# Patient Record
Sex: Female | Born: 1955 | Race: White | Hispanic: No | Marital: Married | Smoking: Never smoker
Health system: Southern US, Community
[De-identification: ages and names within clinical notes are randomized; demographics above are authoritative.]

## PROBLEM LIST (undated history)

## (undated) ENCOUNTER — Ambulatory Visit: Admission: EM | Payer: Medicare Other | Source: Home / Self Care

## (undated) DIAGNOSIS — Z9889 Other specified postprocedural states: Secondary | ICD-10-CM

## (undated) DIAGNOSIS — N189 Chronic kidney disease, unspecified: Secondary | ICD-10-CM

## (undated) DIAGNOSIS — K219 Gastro-esophageal reflux disease without esophagitis: Secondary | ICD-10-CM

## (undated) DIAGNOSIS — I2510 Atherosclerotic heart disease of native coronary artery without angina pectoris: Secondary | ICD-10-CM

## (undated) DIAGNOSIS — M1990 Unspecified osteoarthritis, unspecified site: Secondary | ICD-10-CM

## (undated) DIAGNOSIS — E7800 Pure hypercholesterolemia, unspecified: Secondary | ICD-10-CM

## (undated) DIAGNOSIS — R112 Nausea with vomiting, unspecified: Secondary | ICD-10-CM

## (undated) DIAGNOSIS — I639 Cerebral infarction, unspecified: Secondary | ICD-10-CM

## (undated) DIAGNOSIS — I472 Ventricular tachycardia: Secondary | ICD-10-CM

## (undated) DIAGNOSIS — I7300 Raynaud's syndrome without gangrene: Secondary | ICD-10-CM

## (undated) DIAGNOSIS — J42 Unspecified chronic bronchitis: Secondary | ICD-10-CM

## (undated) DIAGNOSIS — Z87442 Personal history of urinary calculi: Secondary | ICD-10-CM

## (undated) DIAGNOSIS — Z8489 Family history of other specified conditions: Secondary | ICD-10-CM

## (undated) DIAGNOSIS — I209 Angina pectoris, unspecified: Secondary | ICD-10-CM

## (undated) DIAGNOSIS — G43909 Migraine, unspecified, not intractable, without status migrainosus: Secondary | ICD-10-CM

## (undated) DIAGNOSIS — N3010 Interstitial cystitis (chronic) without hematuria: Secondary | ICD-10-CM

## (undated) DIAGNOSIS — F419 Anxiety disorder, unspecified: Secondary | ICD-10-CM

## (undated) DIAGNOSIS — J45909 Unspecified asthma, uncomplicated: Secondary | ICD-10-CM

## (undated) DIAGNOSIS — Z7902 Long term (current) use of antithrombotics/antiplatelets: Secondary | ICD-10-CM

## (undated) DIAGNOSIS — T8859XA Other complications of anesthesia, initial encounter: Secondary | ICD-10-CM

## (undated) DIAGNOSIS — I10 Essential (primary) hypertension: Secondary | ICD-10-CM

## (undated) DIAGNOSIS — Z8719 Personal history of other diseases of the digestive system: Secondary | ICD-10-CM

## (undated) DIAGNOSIS — J449 Chronic obstructive pulmonary disease, unspecified: Secondary | ICD-10-CM

## (undated) DIAGNOSIS — F32A Depression, unspecified: Secondary | ICD-10-CM

## (undated) DIAGNOSIS — Z8739 Personal history of other diseases of the musculoskeletal system and connective tissue: Secondary | ICD-10-CM

## (undated) HISTORY — PX: LAPAROSCOPIC CHOLECYSTECTOMY: SUR755

## (undated) HISTORY — PX: SHOULDER SURGERY: SHX246

## (undated) HISTORY — DX: Chronic kidney disease, unspecified: N18.9

## (undated) HISTORY — PX: REPAIR ANKLE LIGAMENT: SUR1187

## (undated) HISTORY — PX: BACK SURGERY: SHX140

## (undated) HISTORY — PX: CYSTOSCOPY W/ STONE MANIPULATION: SHX1427

## (undated) HISTORY — DX: Raynaud's syndrome without gangrene: I73.00

## (undated) HISTORY — DX: Atherosclerotic heart disease of native coronary artery without angina pectoris: I25.10

## (undated) HISTORY — PX: ABDOMINAL HYSTERECTOMY: SHX81

## (undated) HISTORY — PX: CATARACT EXTRACTION, BILATERAL: SHX1313

## (undated) HISTORY — PX: APPENDECTOMY: SHX54

## (undated) HISTORY — PX: ANTERIOR CERVICAL DECOMP/DISCECTOMY FUSION: SHX1161

## (undated) HISTORY — PX: TUBAL LIGATION: SHX77

## (undated) HISTORY — PX: AUGMENTATION MAMMAPLASTY: SUR837

## (undated) HISTORY — PX: REPLACEMENT TOTAL KNEE: SUR1224

## (undated) HISTORY — PX: TONSILLECTOMY: SUR1361

## (undated) HISTORY — DX: Angina pectoris, unspecified: I20.9

## (undated) HISTORY — PX: KNEE ARTHROSCOPY: SHX127

---

## 1997-12-06 ENCOUNTER — Ambulatory Visit (HOSPITAL_BASED_OUTPATIENT_CLINIC_OR_DEPARTMENT_OTHER): Admission: RE | Admit: 1997-12-06 | Discharge: 1997-12-06 | Payer: Self-pay | Admitting: Orthopedic Surgery

## 1998-11-25 ENCOUNTER — Ambulatory Visit (HOSPITAL_COMMUNITY): Admission: RE | Admit: 1998-11-25 | Discharge: 1998-11-25 | Payer: Self-pay | Admitting: Neurosurgery

## 1998-11-25 ENCOUNTER — Encounter: Payer: Self-pay | Admitting: Neurosurgery

## 1999-03-17 ENCOUNTER — Encounter: Payer: Self-pay | Admitting: Emergency Medicine

## 1999-03-17 ENCOUNTER — Emergency Department (HOSPITAL_COMMUNITY): Admission: EM | Admit: 1999-03-17 | Discharge: 1999-03-17 | Payer: Self-pay | Admitting: Emergency Medicine

## 1999-05-01 ENCOUNTER — Ambulatory Visit (HOSPITAL_COMMUNITY): Admission: RE | Admit: 1999-05-01 | Discharge: 1999-05-01 | Payer: Self-pay | Admitting: *Deleted

## 1999-05-01 ENCOUNTER — Encounter: Payer: Self-pay | Admitting: *Deleted

## 2000-03-17 ENCOUNTER — Encounter: Payer: Self-pay | Admitting: Family Medicine

## 2000-03-17 ENCOUNTER — Encounter: Admission: RE | Admit: 2000-03-17 | Discharge: 2000-03-17 | Payer: Self-pay | Admitting: Family Medicine

## 2000-10-15 ENCOUNTER — Encounter: Admission: RE | Admit: 2000-10-15 | Discharge: 2000-10-15 | Payer: Self-pay | Admitting: Family Medicine

## 2000-10-15 ENCOUNTER — Encounter: Payer: Self-pay | Admitting: Family Medicine

## 2000-11-02 ENCOUNTER — Encounter: Admission: RE | Admit: 2000-11-02 | Discharge: 2000-11-02 | Payer: Self-pay | Admitting: Family Medicine

## 2000-11-02 ENCOUNTER — Encounter: Payer: Self-pay | Admitting: Family Medicine

## 2001-03-21 ENCOUNTER — Ambulatory Visit (HOSPITAL_COMMUNITY): Admission: RE | Admit: 2001-03-21 | Discharge: 2001-03-21 | Payer: Self-pay | Admitting: Family Medicine

## 2001-03-21 ENCOUNTER — Encounter: Payer: Self-pay | Admitting: Family Medicine

## 2001-09-26 ENCOUNTER — Encounter: Admission: RE | Admit: 2001-09-26 | Discharge: 2001-09-26 | Payer: Self-pay | Admitting: Family Medicine

## 2001-09-26 ENCOUNTER — Encounter: Payer: Self-pay | Admitting: Family Medicine

## 2002-08-22 ENCOUNTER — Ambulatory Visit (HOSPITAL_COMMUNITY): Admission: RE | Admit: 2002-08-22 | Discharge: 2002-08-22 | Payer: Self-pay | Admitting: Family Medicine

## 2002-08-22 ENCOUNTER — Encounter: Payer: Self-pay | Admitting: Family Medicine

## 2003-04-11 ENCOUNTER — Encounter: Payer: Self-pay | Admitting: Internal Medicine

## 2003-04-11 ENCOUNTER — Encounter: Admission: RE | Admit: 2003-04-11 | Discharge: 2003-04-11 | Payer: Self-pay | Admitting: Internal Medicine

## 2003-04-30 ENCOUNTER — Encounter (INDEPENDENT_AMBULATORY_CARE_PROVIDER_SITE_OTHER): Payer: Self-pay

## 2003-04-30 ENCOUNTER — Encounter (INDEPENDENT_AMBULATORY_CARE_PROVIDER_SITE_OTHER): Payer: Self-pay | Admitting: Specialist

## 2003-04-30 ENCOUNTER — Observation Stay (HOSPITAL_COMMUNITY): Admission: RE | Admit: 2003-04-30 | Discharge: 2003-04-30 | Payer: Self-pay | Admitting: *Deleted

## 2004-01-15 ENCOUNTER — Ambulatory Visit (HOSPITAL_COMMUNITY): Admission: RE | Admit: 2004-01-15 | Discharge: 2004-01-15 | Payer: Self-pay | Admitting: Family Medicine

## 2004-06-11 ENCOUNTER — Encounter (INDEPENDENT_AMBULATORY_CARE_PROVIDER_SITE_OTHER): Payer: Self-pay | Admitting: Specialist

## 2004-06-11 ENCOUNTER — Ambulatory Visit (HOSPITAL_COMMUNITY): Admission: RE | Admit: 2004-06-11 | Discharge: 2004-06-11 | Payer: Self-pay | Admitting: *Deleted

## 2005-01-15 ENCOUNTER — Ambulatory Visit (HOSPITAL_COMMUNITY): Admission: RE | Admit: 2005-01-15 | Discharge: 2005-01-15 | Payer: Self-pay | Admitting: Internal Medicine

## 2006-03-11 ENCOUNTER — Ambulatory Visit (HOSPITAL_COMMUNITY): Admission: RE | Admit: 2006-03-11 | Discharge: 2006-03-11 | Payer: Self-pay | Admitting: Internal Medicine

## 2006-12-07 ENCOUNTER — Ambulatory Visit (HOSPITAL_COMMUNITY): Admission: RE | Admit: 2006-12-07 | Discharge: 2006-12-07 | Payer: Self-pay | Admitting: Internal Medicine

## 2007-04-06 ENCOUNTER — Ambulatory Visit (HOSPITAL_COMMUNITY): Admission: RE | Admit: 2007-04-06 | Discharge: 2007-04-06 | Payer: Self-pay | Admitting: Internal Medicine

## 2007-07-12 ENCOUNTER — Emergency Department (HOSPITAL_COMMUNITY): Admission: EM | Admit: 2007-07-12 | Discharge: 2007-07-12 | Payer: Self-pay | Admitting: Emergency Medicine

## 2007-08-01 ENCOUNTER — Encounter: Admission: RE | Admit: 2007-08-01 | Discharge: 2007-08-01 | Payer: Self-pay | Admitting: Orthopedic Surgery

## 2007-10-03 ENCOUNTER — Ambulatory Visit (HOSPITAL_COMMUNITY): Admission: RE | Admit: 2007-10-03 | Discharge: 2007-10-04 | Payer: Self-pay | Admitting: Neurosurgery

## 2007-10-04 ENCOUNTER — Emergency Department (HOSPITAL_COMMUNITY): Admission: EM | Admit: 2007-10-04 | Discharge: 2007-10-04 | Payer: Self-pay | Admitting: Emergency Medicine

## 2008-05-09 ENCOUNTER — Encounter: Admission: RE | Admit: 2008-05-09 | Discharge: 2008-05-09 | Payer: Self-pay | Admitting: Internal Medicine

## 2008-05-16 ENCOUNTER — Encounter: Admission: RE | Admit: 2008-05-16 | Discharge: 2008-05-16 | Payer: Self-pay | Admitting: Internal Medicine

## 2008-05-30 ENCOUNTER — Encounter: Admission: RE | Admit: 2008-05-30 | Discharge: 2008-05-30 | Payer: Self-pay | Admitting: Internal Medicine

## 2008-06-13 ENCOUNTER — Encounter: Admission: RE | Admit: 2008-06-13 | Discharge: 2008-06-13 | Payer: Self-pay | Admitting: Internal Medicine

## 2008-11-08 ENCOUNTER — Ambulatory Visit (HOSPITAL_COMMUNITY): Admission: RE | Admit: 2008-11-08 | Discharge: 2008-11-08 | Payer: Self-pay | Admitting: Endocrinology

## 2008-12-07 ENCOUNTER — Encounter: Admission: RE | Admit: 2008-12-07 | Discharge: 2008-12-07 | Payer: Self-pay | Admitting: Internal Medicine

## 2008-12-21 ENCOUNTER — Encounter: Admission: RE | Admit: 2008-12-21 | Discharge: 2008-12-21 | Payer: Self-pay

## 2009-01-04 ENCOUNTER — Encounter: Admission: RE | Admit: 2009-01-04 | Discharge: 2009-01-04 | Payer: Self-pay | Admitting: Internal Medicine

## 2009-08-12 ENCOUNTER — Ambulatory Visit (HOSPITAL_COMMUNITY): Admission: RE | Admit: 2009-08-12 | Discharge: 2009-08-12 | Payer: Self-pay | Admitting: Internal Medicine

## 2009-10-04 ENCOUNTER — Ambulatory Visit (HOSPITAL_BASED_OUTPATIENT_CLINIC_OR_DEPARTMENT_OTHER): Admission: RE | Admit: 2009-10-04 | Discharge: 2009-10-04 | Payer: Self-pay | Admitting: Orthopedic Surgery

## 2010-07-10 ENCOUNTER — Ambulatory Visit (HOSPITAL_COMMUNITY): Admission: RE | Admit: 2010-07-10 | Discharge: 2010-07-10 | Payer: Self-pay | Admitting: Internal Medicine

## 2010-09-27 ENCOUNTER — Encounter: Payer: Self-pay | Admitting: Family Medicine

## 2010-09-28 ENCOUNTER — Encounter: Payer: Self-pay | Admitting: Internal Medicine

## 2010-09-28 ENCOUNTER — Encounter: Payer: Self-pay | Admitting: Endocrinology

## 2010-11-23 LAB — POCT HEMOGLOBIN-HEMACUE: Hemoglobin: 15.3 g/dL — ABNORMAL HIGH (ref 12.0–15.0)

## 2011-01-20 NOTE — Op Note (Signed)
NAME:  Lippert, Thresia              ACCOUNT NO.:  [id number]   MEDICAL RECORD NO.:  [id number]          PATIENT TYPE:  OIB   LOCATION:  3534                         FACILITY:  [facility]   PHYSICIAN:  Hewitt Shorts, M.D.DATE OF BIRTH:  [birth year]   DATE OF PROCEDURE:  DATE OF DISCHARGE:  10/04/2007                               OPERATIVE REPORT   PREOPERATIVE DIAGNOSES:  1. Cervical disk herniation.  2. Cervical spondylosis.  3. Cervical degenerative disk disease.  4. Cervical radiculopathy.   POSTOPERATIVE DIAGNOSES:  1. Cervical disk herniation.  2. Cervical spondylosis.  3. Cervical degenerative disk disease.  4. Cervical radiculopathy.   PROCEDURE:  C5-C6 and C6-C7 anterior cervical decompression and  arthrodesis with structural allograft and tether cervical plating.   SURGEON:  Hewitt Shorts, M.D.   ASSISTANT:  Danae Orleans. Venetia Maxon, M.D.   ANESTHESIA:  General endotracheal.   INDICATIONS:  The patient is a 55 year old woman who presented with neck  and left cervical radicular pain.  She was found to have spondylosis and  degenerative disc disease C6-C7 worse than C5-C6 with a left C6-C7  cervical disk herniation.  A decision was made to proceed with a 2 level  anterior cervical decompression and arthrodesis.   DESCRIPTION OF PROCEDURE:  The patient was brought to the operating room  and placed under general endotracheal anesthesia.  The patient was  placed in 10 pounds of halter traction.  The neck was prepped with  Betadine soap and solution and draped in a sterile fashion.  A  horizontal incision was made on the left side of the neck.  The line of  the incision was infiltrated with local anesthetic with epinephrine.  Dissection was made and carried down to the subcutaneous tissue and  platysma and bipolar cautery was used to maintain hemostasis.  Dissection was then carried out through an avascular plane leaving the  sternocleidomastoid muscle and carotid artery  and jugular vein laterally  and trachea and esophagus medially.  The ventral aspect of the vetebral  column was identified and localizing x-ray taken and the C5-C6 and C6-C7  intravertebral disk space was identified.  Diskectomy was begun with  incision of the annulus, continued with micro curettes and pituitary  rongeurs.  Anterior osteophytic overgrowth were removed using a lexel  rongeur and osteophyte removal tool as well as Kerrison punches.  We  entered into each of the disk spaces.  The C6-C7 level was very  spondylitic and narrowed.  The disk material was removed using pituitary  rongeurs and micro curettes and then the cartilaginous endplates were  removed using micro curettes along with the XMax drill.  The microscope  was draped and brought into the field to provide visualization,  magnification and illumination and the remainder of decompression was  performed using microdissection and microsurgical technique.  Posterior  osteophytic overgrowth was removed using the XMax drill with a 2 mm  Kerrison punch with thin foot plates.  The posterior longitudinal  ligament was carefully removed and then we decompressed the thecal sac  and spinal canal.  We then turned our attention to the  neural foramina  and similarly we removed spondylitic overgrowth and disk herniation and  decompressed the axillary nerve roots bilaterally at each level.  Once  decompression was completed hemostasis was established with the use of  Gelfoam soaked in thrombin, and then we measured the height of the  intravertebral disk space and selected two 7-mm implants, each of these  pieces of structural allograft were hydrated with saline solution and  then positioned in the intravertebral disk space and counter sunk.  We  then selected a 32-mm tether cervical plate was positioned over the  fusion contour.  It was secured to the vertebra with 4 x 13-mm variable  angle screws, each screw hole was drilled in the  tap and the screws were  placed in the alternating fashion.  Once all 5 screws were placed, final  plating was performed.  The cervical traction was discontinued and then  was irrigated with Bacitracin solution and was checked for hemostasis  which was confirmed, and then x-rays taken showed the graft, plate, and  screws in good position.  The alignment was good, and then we proceeded  with closure.  The platysma was closed with interrupted inverted 2-0  Vicryl sutures, the subcutaneous and subcuticular were closed with  interrupted inverted 3-0 Vicryl sutures, and the skin was reapproximated  with Dermabond.  The procedure was tolerated well.  The estimated blood  loss was 25 cc.  Sponge and needle count were correct following surgery.  The patient was placed in a soft cervical collar, to be reversed from  the anesthetic, extubated, and transferred to the recovery room for  further care.      Hewitt Shorts, M.D.  Electronically Signed     RWN/MEDQ  D:  10/03/2007  T:  10/04/2007  Job:  [id number]

## 2011-01-20 NOTE — Consult Note (Signed)
NAME:  Tonya Boyd, Tonya Boyd              ACCOUNT NO.:  [id number]   MEDICAL RECORD NO.:  [id number]          PATIENT TYPE:  EMS   LOCATION:  MAJO                         FACILITY:  [facility]   PHYSICIAN:  Hewitt Shorts, M.D.DATE OF BIRTH:  11/06/1955   DATE OF CONSULTATION:  10/04/2007  DATE OF DISCHARGE:  10/04/2007                                 CONSULTATION   HISTORY OF PRESENT ILLNESS:  The patient is a 55 year old white female  who is one day status post 2-level C5-C6 and C6-C7 anterior cervical  decompression and arthrodesis.  She was discharged this morning to home  with a prescription for Darvocet.  She did use some Aleve as well as  Darvocet at home.  She contacted our office this afternoon saying that  she was having itching, welts, and difficulty breathing and we  recommended that she come into the emergency room for evaluation.   PHYSICAL EXAMINATION:  Vital signs at the time of her arrival included  temperature 97.3, pulse 112, blood pressure 157/91, and oxygen  saturation of 100% on room air.   On examination, the patient was found to be resting comfortably,  scratching her skin with a mild erythema in the scratched areas, but no  hives were seen on her face or torso.  Her lungs were clear to  auscultation.  She had symmetrical respiratory excursions.  Her wound  had healed well.  There was no erythema or swelling.   The patient was given Benadryl 50 mg by mouth and after about 1 hour was  feeling much better with the itching resolved and the erythema cleared.   IMPRESSION:  Pruritus, resolved.   RECOMMENDATIONS:  Benadryl 25 to 50 mg by mouth after every 4 hours  p.r.n. for itching and Tylenol p.r.n. for pain.  She is scheduled for  followup in 3 weeks in the office, but she is to return sooner if she  has further difficulties.      Hewitt Shorts, M.D.  Electronically Signed     RWN/MEDQ  D:  10/04/2007  T:  10/05/2007  Job:  [id number]

## 2011-01-23 NOTE — Op Note (Signed)
NAME:  Tonya Boyd, Tonya Boyd                        ACCOUNT NO.:  [id number]   MEDICAL RECORD NO.:  [id number]                   PATIENT TYPE:  OBV   LOCATION:  9198                                 FACILITY:  [facility]   PHYSICIAN:  Pershing Cox, M.D.            DATE OF BIRTH:  [birth year]   DATE OF PROCEDURE:  04/30/2003  DATE OF DISCHARGE:                                 OPERATIVE REPORT   PREOPERATIVE DIAGNOSIS:  Complex left ovarian cyst.   POSTOPERATIVE DIAGNOSIS:  Complex left ovarian cyst.   PROCEDURE:  Diagnostic laparoscopic with left salpingo-oophorectomy.   INDICATIONS FOR PROCEDURE:  The patient is 55 years old.  She is having back  pain and underwent an MRI.  An abnormal ovary was noted and this was  followed up by sonogram which showed Boyd cystic lesion with Boyd mural wall  nodule.  The patient is status post vaginal hysterectomy.  Both of her  ovaries were left in place at that time.  Despite the possibility of  spontaneous resolution, the patient insisted on moving ahead with an  operative procedure because of her fear of malignancy.   DESCRIPTION OF PROCEDURE:  The patient was taken to the operating room on  the day of April 30, 2003, after receiving Boyd mini bowel prep.  Diagnostic  laparoscopy was adequate.  We could see the ovarian cyst.  It was densely  adherent to the left wall of the rectosigmoid and to the round ligament.  We  were able to remove it from both of these attachment sites and the vascular  pedicle or IP ligament was easily accessible.  The ureter could be seen and  therefore the ovary could be adequately excised. The right fallopian tube  and ovary were visualized and were normal.  Boyd scar from the patient's  previous appendectomy was noted.  There were no intra-abdominal adhesions  noted.   DESCRIPTION OF PROCEDURE:  Tonya Boyd was brought to the operating room  with an IV in place.  It was very difficult to place this IV and this  delayed the  start of the case.  In the operating room, she was placed supine  on the OR table and IV sedation was administered.  Endotracheal anesthesia  was established easily.  The patient was placed in [place] stirrups with PAS  stockings placed to the top of her thighs.  The anterior abdominal wall was  prepped with Boyd solution of Hibiclens, paying attention to the umbilicus.  Boyd  Foley catheter was sterilely inserted into the bladder and she was draped  for Boyd laparoscopic procedure.   Marcaine was inserted into the subumbilical scar where the patient had had Boyd  previous laparoscopy.  The scar was incised in Boyd curvilinear fashion, taking  it down into the dermis.  The subcutaneous tissues were spread and the  fascia was noted.  It was lifted with Kocher clamps and incised. The  incision was extended so that my finger would fit and in palpating, we had  entered the peritoneal cavity with this first incision.  There was no  evidence of injury to underlying structures or to bowel.  Boyd pursestring  suture was placed around the fascia and the Hasson cannula was inserted and  secured to the pursestring suture.  The diagnostic scope was introduced and  we were able to manipulate in such Boyd way that we could see that the  rectosigmoid was adherent to our left adnexal mass.  There was no other  evidence of significant scarring, so Boyd decision was made to try the  laparoscopic procedure.  The 5 mm ports were placed in both the right and  left lower abdomen, transilluminating and taking every effort to avoid  causing Boyd vascular injury of the epigastrics.  First using grasping forceps  and Boyd probe, the ovary was explored and we were able to see the ureter  beneath the vascular pedicle.  The IP ligament was easily accessible.  There  was scarring to the side wall of the rectosigmoid, but we decided to begin  the operation and see if we would be able to take this off without  difficulty.  There was Boyd small  hydrosalpinx on the left fallopian tube.  There was Boyd small hydrosalpinx on the left fallopian tube.  We were able to  remove the ovary without injuring this.  Using the tripolar cautery, the IP  ligament was lifted and cauterized without difficulty.  This was done in  several steps to avoid injuring the blood supply to this ovary.  We were  then able to lift the ovary and seek detachment to the rectosigmoid.  The  rectosigmoid was reflected medially and using sharp scissors we were able to  separate the rectosigmoid from the side wall of the ovary and fallopian  tube.  Then, by lifting it, we were able to use the tripolar cautery to  separate it from the attached round ligament.  The ovary was then lifted  freely from its attachment to these structures and the structures were  carefully visualized and were appropriate.  Small bleeders were cauterized  with the tripolar.  There was no evidence of any injury to the rectosigmoid  and all of our dissection was far away from the lumen.   Photographs were taken of the remainder of the peritoneal cavity.  The right  fallopian tube and ovary were visualized.  There were no photographs taken  of the upper abdomen and the patient's previous appendectomy scar.   Boyd 5 mm camera was then placed through the right lower quadrant trocar and Boyd  catchment bag was placed through the umbilicus. The ovary and fallopian tube  were easily dropped into this bag and retrieved through the umbilical port.  Hasson cannula and scope were then reintroduced through the umbilical port  and photographs were taken.  Washings were taken at this time and collected  to send for peritoneal washings.  We carefully inspected the operative site  and there was no evidence of bleeding. Gas was removed from the peritoneal  cavity and still no evidence of back bleeding.  Trocars were removed under  direct visualization.  The Hasson cannula was removed and Boyd finger was placed into  the peritoneal cavity while tying down the fascial incision  around the umbilicus.  The skin edges were closed with Boyd running 3-0 Vicryl  suture.  Dermabond was used to close the  lower 5 mm ports.   The patient was taken to the recovery room where she will be observed.  The  decision will be made as to whether she needs to be admitted for  observation.                                               Pershing Cox, M.D.    MAJ/MEDQ  D:  04/30/2003  T:  04/30/2003  Job:  [id number]   cc:   Filomena Jungling, N.P.

## 2011-01-23 NOTE — Op Note (Signed)
NAME:  Tonya Boyd, Tonya Boyd              ACCOUNT NO.:  [id number]   MEDICAL RECORD NO.:  [id number]          PATIENT TYPE:  AMB   LOCATION:  ENDO                         FACILITY:  [facility]   PHYSICIAN:  Georgiana Spinner, M.D.    DATE OF BIRTH:  March 29, 1956   DATE OF PROCEDURE:  06/11/2004  DATE OF DISCHARGE:                                 OPERATIVE REPORT   PROCEDURE:  Upper endoscopy.   INDICATIONS:  GERD.   ANESTHESIA:  Demerol 60 mg, Versed 6 mg.   PROCEDURE:  With the patient mildly sedated in the left lateral decubitus  position, the Olympus videoscopic endoscope was inserted in the mouth,  passed under direct vision through the esophagus, which appeared normal.  There was no evidence of Barrett's.  We entered into the stomach through a  small hiatal hernia.  Fundus, body, antrum, duodenal bulb, second portion of  duodenum all appeared normal.  From this point the endoscope was slowly  withdrawn taking circumferential views of the duodenal mucosa until the  endoscope had been pulled back into the stomach, placed in retroflexion to  view the stomach from below.  The endoscope was straightened and withdrawn,  taking circumferential views of the remaining gastric and esophageal mucosa.  The patient's vital signs and pulse oximetry remained stable.  The patient  tolerated the procedure well without apparent complication.   FINDINGS:  Unremarkable examination.  Small hiatal hernia.   PLAN:  Proceed to colonoscopy as planned.      GMO/MEDQ  D:  06/11/2004  T:  06/11/2004  Job:  [id number]

## 2011-01-23 NOTE — Op Note (Signed)
NAME:  Tonya Boyd, Tonya Boyd              ACCOUNT NO.:  [id number]   MEDICAL RECORD NO.:  [id number]          PATIENT TYPE:  AMB   LOCATION:  ENDO                         FACILITY:  [facility]   PHYSICIAN:  Georgiana Spinner, M.D.    DATE OF BIRTH:  [birth year]   DATE OF PROCEDURE:  06/11/2004  DATE OF DISCHARGE:                                 OPERATIVE REPORT   PROCEDURE:  Colonoscopy.   ENDOSCOPIST:  Georgiana Spinner, M.D.   INDICATIONS:  Colon polyp.   ANESTHESIA:  Demerol 10 mg, Versed 2 mg, Benadryl 25 mg.   Of note, for patient's previous endoscopy, add Phenergan 12.5 mg.   PROCEDURE:  With the patient mildly sedated in the left lateral decubitus  position, the Olympus videoscopic colonoscope was inserted in the rectum and  passed under direct vision to the cecum, identified by ileocecal valve and  base of cecum, both of which were photographed.  From this point, the  colonoscope was slowly withdrawn, taking circumferential views of the  colonic mucosa, stopping in the hepatic flexure area, where a polyp was  seen, photographed and removed using hot-biopsy-forceps technique, setting  of 20/150 blended current with the Erbe pulse generator.  The endoscope was  then withdrawn all the way to the rectum, which appeared normal on direct  and showed hemorrhoids on retroflexed view.  The endoscope was straightened  and withdrawn.  The patient's vital signs and pulse oximetry remained  stable.  The patient tolerated the procedure well without apparent  complication.   FINDINGS:  1.  Internal hemorrhoids.  2.  Polyp of hepatic flexure.   PLAN:  Await biopsy report.  The patient will call me for results and follow  up with me as an outpatient.      GMO/MEDQ  D:  06/11/2004  T:  06/11/2004  Job:  [id number]

## 2011-01-23 NOTE — Consult Note (Signed)
NAME:  Tonya Boyd, Tonya Boyd                        ACCOUNT NO.:  [id number]   MEDICAL RECORD NO.:  [id number]                   PATIENT TYPE:  OUT   LOCATION:  MAMO                                 FACILITY:  [facility]   PHYSICIAN:  Aundra Dubin, M.D.            DATE OF BIRTH:  03/08/1956   DATE OF CONSULTATION:  09/04/2002  DATE OF DISCHARGE:  08/22/2002                                   CONSULTATION   CHIEF COMPLAINT:  Shoulders, back, hips, knees, and legs.   HISTORY:  The patient returns with her painful syndrome that was suggestive  of possible polymyalgia rheumatica when I saw her on 08/24/02.  She was on  prednisone at that time and I had her increase this to 30 mg q.d.  She is  reporting that she basically is not feeling any better.  She says the right  shoulder still hurts Boyd great deal.  Her worst area is around the low back,  hips, knees, and down to her ankles.  She says this is Boyd sharp piercing type  of pain which also feels as if there is Boyd stabbing pain.  She is unsteady on  her feet and uses Boyd walker.  She feels that the knees hurt and will not hold  her up.  Her weight is up six pounds.  She will have episodes of pain in her  shins at night that last up to 10-15 seconds.  There is usually one episode  and she is able to sleep after this.  She says sleep is generally pretty  good.  She has measured the right knee and feels that this has increased in  size up and down over the nine days since I have seen her.  There have been  no URIs.  No chest pain, shortness of breath, fevers, rashes, headaches,  without claudication, visual changes, no numbness or tingling to any  extremities.  Her hands and wrists have not bothered her.   PAST MEDICAL HISTORY:   PAST SURGICAL HISTORY:  Reviewed from 08/24/02 and unchanged.   CURRENT MEDICATIONS:  Prednisone 30 mg q.d.  Otherwise medicines are unchanged from 08/24/02.   ALLERGIES:   FAMILY HISTORY:   SOCIAL HISTORY:  Reviewed  and unchanged from 08/24/02.   PHYSICAL EXAMINATION:  VITAL SIGNS: Weight 198 pounds, blood pressure  140/80, respirations 16, pulse 80.  GENERAL: She appears to be somewhat uncomfortable.  She has her walker with  her.  SKIN: No ecchymosis.  Clear.  HEENT: PERRL/EOMI.  Mouth clear.  NECK: Normal thyroid.  Negative JVD.  LUNGS: Clear.  HEART: Regular with no murmur.  ABDOMEN: Negative.  No HSM.  Nontender.  MUSCULOSKELETAL: The hands, wrists, elbows, and left shoulder have good  range of motion without any synovitis.  She has mild abduction pain to the  shoulder and can raise this to 150 degrees fairly easily.  Trigger points at  the elbows,  shoulder, neck, occiput, upper paraspinous muscles, and along  the back have mild if any tenderness.  Compression across the low back was  quite tender with some wincing.  Hips with good range of motion.  The knees,  when flexed, cause significant pain.  They are cool and there is no  effusion.  There was moderate right knee medial joint line tenderness.  The  left knee had mild medial joint line tenderness that was cool and had no  effusion.  The hips had Boyd good range of motion.  The ankles and lower  extremities showed 2+ edema.  They were tender.  There was no arthritic  swelling to the feet.  NEUROLOGIC: Strength is 5/5.  On the straight leg lift there is back  discomfort but no radiating pain.  The DTRs are 1+ throughout.  Sensation is  intact.  Boyd&O x3.   ASSESSMENT:   PLAN:  1. Back and hip pain.  I will have her go for Boyd lumbar x-ray and MRI to rule     out any serious abnormalities.  She did have some tracer uptake on the     bone scan in the low back.  I have placed her Vicodin 5/500 mg one b.i.d.-     t.i.d.  2. Knee pain.  I reviewed Dr. Sanjuan Dame notes and in them there is Boyd     description of her being knock-kneed with degenerative changes.  She     has some medial joint line tenderness.  I offered to inject this but she      would rather not at this point.  She will use the pain medicine as above.  3. 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  Her syndrome is clearly not PMR.     An ESR was 2 but she was on prednisone.  Other labs, she had Boyd WBC of     17,000, HGB 15.3, PLT 358.  BUN 29, creatinine 0.9, glucose 97.  AST 24,     RF 20, ESR 2.  She will lower the prednisone to 20 mg, then 15 mg each     day for one week intervals, then 10 mg Boyd day.  I may want to add an NSAID     on return.   She will return in two weeks.                                               Aundra Dubin, M.D.    WWT/MEDQ  D:  09/04/2002  T:  09/04/2002  Job:  [id number]   cc:   Teola Bradley, M.D.  [phone] [address]  [place] [address]  Fax: [phone]   Mila Homer. Sudie Bailey, M.D.  [address] [place], [place] [address]  Fax: [phone]

## 2011-05-28 LAB — CBC
HCT: 45.1
Hemoglobin: 15.2 — ABNORMAL HIGH
MCHC: 33.8
MCV: 95.7
Platelets: 364
RBC: 4.71
RDW: 13
WBC: 9.4

## 2011-10-06 ENCOUNTER — Encounter: Payer: Self-pay | Admitting: Orthopedic Surgery

## 2011-10-06 ENCOUNTER — Ambulatory Visit (INDEPENDENT_AMBULATORY_CARE_PROVIDER_SITE_OTHER): Payer: 59 | Admitting: Orthopedic Surgery

## 2011-10-06 VITALS — BP 120/70 | Ht 64.5 in | Wt 140.0 lb

## 2011-10-06 DIAGNOSIS — M7650 Patellar tendinitis, unspecified knee: Secondary | ICD-10-CM

## 2011-10-06 MED ORDER — DICLOFENAC SODIUM 1 % TD GEL: 1.0000 "application " | Freq: Four times a day (QID) | TRANSDERMAL | Status: DC

## 2011-10-06 MED ORDER — ACETAMINOPHEN-CODEINE #3 300-30 MG PO TABS: 1.0000 | ORAL_TABLET | Freq: Four times a day (QID) | ORAL | Status: AC | PRN

## 2011-10-06 NOTE — Patient Instructions (Signed)
Start Pt at [facility] outpatient dept call for appointment

## 2011-10-06 NOTE — Progress Notes (Signed)
Patient ID: Tonya Boyd, female   DOB: 04/16/1956, 56 y.o.   MRN: [id number]  new   Chief Complaint  Patient presents with  . Knee Pain    Lt knee pain and swelling, sudden onset 09/02/11, no known injury   History  The patient is a 56 year old female, who has been asymptomatic until about 3 weeks ago, when she was taking care of her granddaughter and going up and down the stairs at her home she started having sharp 8/10. Constant pain in the front of her knee. 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 Mild chondromalacia. The lateral tibial compartment.  This is causing her to have catching, swelling.  Naprosyn and prednisone will cause hypertension, she also has an esophageal problem, which will cause problems with anti-inflammatories.  Review of Systems  HENT: Negative for nosebleeds.   Gastrointestinal: Positive for heartburn.     Physical Exam(12) GENERAL: normal development   CDV: pulses are normal   Skin: normal  Lymph: nodes were not palpable/normal  Psychiatric: awake, alert and oriented  Neuro: normal sensation  MSK LEFT knee 1 Active range of motion of the LEFT knee is 90, passive range of motion is 130 2 There is no joint effusion. There is thickening of the retropatellar fat pad, there is tenderness on the medial compartment, crepitance in the patellofemoral compartment 3 Strength is normal 4 Muscle tone is normal 5 The knee is stable 6 Meniscal signs are negative  Assessment: MRI of the knee was done at [facility] on January 22. Findings are listed above. She also had a plain film, which showed minimal degenerative change, primarily around the patellofemoral joint with  minimal loss of joint space medial compartment    Plan:  Physical therapy   Topical voltaren gel 4 gm bid   Return in 6 weeks

## 2011-10-08 ENCOUNTER — Telehealth: Payer: Self-pay | Admitting: Orthopedic Surgery

## 2011-10-08 NOTE — Telephone Encounter (Signed)
Tonya Boyd wants to go to [facility] for her PT, says they can see her sooner than [facility]  [facility] [address], [place]  Phone # [phone] Fax # [phone]  Says they can see her this coming Tuesday

## 2011-10-08 NOTE — Telephone Encounter (Signed)
Per Dr. Romeo Canada, I relayed back to [facility] at [place] as follows: Voltaren gel 4 grams 2X daily.

## 2011-10-08 NOTE — Telephone Encounter (Signed)
Torrence  Gagen said that [employer] did not receive the prescription for Tylenol/Codeine

## 2011-10-08 NOTE — Telephone Encounter (Signed)
Dominique from [facility] asking about dosage for Voltaren gel. Is it 2x or 4x daily?

## 2011-10-08 NOTE — Telephone Encounter (Signed)
Called script to pharmacy

## 2011-10-09 NOTE — Telephone Encounter (Signed)
New order sent as requested.

## 2011-10-09 NOTE — Telephone Encounter (Signed)
Contacted patient, notified. °

## 2011-10-15 ENCOUNTER — Ambulatory Visit: Payer: 59

## 2011-10-21 ENCOUNTER — Encounter: Payer: Self-pay | Admitting: Orthopedic Surgery

## 2011-10-21 ENCOUNTER — Ambulatory Visit (INDEPENDENT_AMBULATORY_CARE_PROVIDER_SITE_OTHER): Payer: 59 | Admitting: Orthopedic Surgery

## 2011-10-21 VITALS — BP 140/78 | Ht 64.5 in | Wt 140.0 lb

## 2011-10-21 DIAGNOSIS — M629 Disorder of muscle, unspecified: Secondary | ICD-10-CM

## 2011-10-21 DIAGNOSIS — M7632 Iliotibial band syndrome, left leg: Secondary | ICD-10-CM | POA: Insufficient documentation

## 2011-10-21 NOTE — Patient Instructions (Signed)
No knee exercises until Monday Return to work tomorrow

## 2011-10-21 NOTE — Progress Notes (Signed)
Patient ID: Tonya Boyd, female   DOB: 08-08-1956, 56 y.o.   MRN: [id number]  F/U visit:   Knee Pain     Lt knee pain and swelling, sudden onset 09/02/11, no known injury    History  The patient is a 56 year old female, who has been asymptomatic until about 3 weeks ago, when she was taking care of her granddaughter and going up and down the stairs at her home she started having sharp 8/10. Constant pain in the front of her knee. 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 Mild chondromalacia. The lateral tibial compartment.  This is causing her to have catching, swelling.  Naprosyn and prednisone will cause hypertension, she also has an esophageal problem, which will cause problems with anti-inflammatories.  [facility]  The patient has done therapy for several weeks now her anterior knee pain has improved but now she has lateral knee pain which was present during her last visit but is worsened since we saw her last she came in for an unscheduled visit to have this evaluated.  Pain is on the lateral side of the knee over the iliotibial band fibular head and also just behind the fibula.  There is a pain and a popping sensation that occurs on that side of the knee.  Clinical exam shows tenderness there RIGHT iliotibial band tenderness over Gerdes tubercle  Recommend injection  Injection performed successfully  Out of work one day  Follow-up Monday for reevaluation.

## 2011-10-26 ENCOUNTER — Encounter: Payer: Self-pay | Admitting: Orthopedic Surgery

## 2011-10-26 ENCOUNTER — Ambulatory Visit (INDEPENDENT_AMBULATORY_CARE_PROVIDER_SITE_OTHER): Payer: 59 | Admitting: Orthopedic Surgery

## 2011-10-26 DIAGNOSIS — M629 Disorder of muscle, unspecified: Secondary | ICD-10-CM

## 2011-10-26 DIAGNOSIS — M2240 Chondromalacia patellae, unspecified knee: Secondary | ICD-10-CM

## 2011-10-26 DIAGNOSIS — M7632 Iliotibial band syndrome, left leg: Secondary | ICD-10-CM

## 2011-10-26 NOTE — Progress Notes (Signed)
Patient ID: Tonya Boyd, female   DOB: [birth year], 56 y.o.   MRN: [id number] Chief Complaint  Patient presents with  . Follow-up    5 day recheck of Lt knee, patient states no improvement since injection    The patient is still having pain in the back of the knee and also in the iliotibial band. Despite injection.  Anterior knee pain has resolved mainly from a tear in.  The knee seems to have tight hamstrings as well. There is tenderness in the lateral hamstring muscle group and tendon group behind her knee.  We have decided to get a 2nd opinion regarding her knee. She will get this done and get back to me.  She will continue with hamstring stretching, and Voltaren gel

## 2011-10-26 NOTE — Patient Instructions (Signed)
Call Dr after 2nd opinion

## 2011-10-27 ENCOUNTER — Ambulatory Visit: Payer: 59 | Admitting: Orthopedic Surgery

## 2011-11-18 ENCOUNTER — Ambulatory Visit: Payer: 59 | Admitting: Orthopedic Surgery

## 2012-01-01 ENCOUNTER — Other Ambulatory Visit (HOSPITAL_COMMUNITY): Payer: Self-pay | Admitting: Internal Medicine

## 2012-01-01 DIAGNOSIS — Z1231 Encounter for screening mammogram for malignant neoplasm of breast: Secondary | ICD-10-CM

## 2012-02-04 ENCOUNTER — Ambulatory Visit (HOSPITAL_COMMUNITY)
Admission: RE | Admit: 2012-02-04 | Discharge: 2012-02-04 | Disposition: A | Discharge status: Home / Self Care | Payer: 59 | Source: Ambulatory Visit | Attending: Internal Medicine | Admitting: Internal Medicine

## 2012-02-04 DIAGNOSIS — Z1231 Encounter for screening mammogram for malignant neoplasm of breast: Secondary | ICD-10-CM

## 2013-01-04 ENCOUNTER — Other Ambulatory Visit: Payer: Self-pay

## 2013-01-04 DIAGNOSIS — Z1231 Encounter for screening mammogram for malignant neoplasm of breast: Secondary | ICD-10-CM

## 2013-01-04 DIAGNOSIS — Z9882 Breast implant status: Secondary | ICD-10-CM

## 2013-02-06 ENCOUNTER — Ambulatory Visit
Admission: RE | Admit: 2013-02-06 | Discharge: 2013-02-06 | Disposition: A | Discharge status: Home / Self Care | Payer: 59 | Source: Ambulatory Visit

## 2013-02-06 DIAGNOSIS — Z9882 Breast implant status: Secondary | ICD-10-CM

## 2013-02-06 DIAGNOSIS — Z1231 Encounter for screening mammogram for malignant neoplasm of breast: Secondary | ICD-10-CM

## 2013-02-07 ENCOUNTER — Other Ambulatory Visit: Payer: Self-pay | Admitting: Internal Medicine

## 2013-02-07 DIAGNOSIS — R928 Other abnormal and inconclusive findings on diagnostic imaging of breast: Secondary | ICD-10-CM

## 2013-02-15 ENCOUNTER — Ambulatory Visit
Admission: RE | Admit: 2013-02-15 | Discharge: 2013-02-15 | Disposition: A | Discharge status: Home / Self Care | Payer: 59 | Source: Ambulatory Visit | Attending: Internal Medicine | Admitting: Internal Medicine

## 2013-02-15 DIAGNOSIS — R928 Other abnormal and inconclusive findings on diagnostic imaging of breast: Secondary | ICD-10-CM

## 2014-12-04 ENCOUNTER — Other Ambulatory Visit: Payer: Self-pay | Admitting: Internal Medicine

## 2014-12-04 DIAGNOSIS — N6489 Other specified disorders of breast: Secondary | ICD-10-CM

## 2014-12-04 DIAGNOSIS — Z1210 Encounter for screening for malignant neoplasm of intestinal tract, unspecified: Secondary | ICD-10-CM

## 2014-12-07 ENCOUNTER — Ambulatory Visit
Admission: RE | Admit: 2014-12-07 | Discharge: 2014-12-07 | Disposition: A | Discharge status: Home / Self Care | Payer: BLUE CROSS/BLUE SHIELD | Source: Ambulatory Visit | Attending: Internal Medicine | Admitting: Internal Medicine

## 2014-12-07 ENCOUNTER — Other Ambulatory Visit: Payer: Self-pay | Admitting: Internal Medicine

## 2014-12-07 DIAGNOSIS — N6489 Other specified disorders of breast: Secondary | ICD-10-CM

## 2016-07-13 ENCOUNTER — Other Ambulatory Visit: Payer: Self-pay | Admitting: Internal Medicine

## 2016-07-13 DIAGNOSIS — N6489 Other specified disorders of breast: Secondary | ICD-10-CM

## 2016-08-14 ENCOUNTER — Other Ambulatory Visit: Payer: BLUE CROSS/BLUE SHIELD

## 2016-09-04 ENCOUNTER — Ambulatory Visit
Admission: RE | Admit: 2016-09-04 | Discharge: 2016-09-04 | Disposition: A | Discharge status: Home / Self Care | Payer: BLUE CROSS/BLUE SHIELD | Source: Ambulatory Visit | Attending: Internal Medicine | Admitting: Internal Medicine

## 2016-09-04 DIAGNOSIS — N6489 Other specified disorders of breast: Secondary | ICD-10-CM

## 2017-01-09 ENCOUNTER — Encounter (HOSPITAL_COMMUNITY): Payer: Self-pay | Admitting: Emergency Medicine

## 2017-01-09 ENCOUNTER — Emergency Department (HOSPITAL_COMMUNITY)
Admission: EM | Admit: 2017-01-09 | Discharge: 2017-01-09 | Disposition: A | Discharge status: Home / Self Care | Payer: BLUE CROSS/BLUE SHIELD | Attending: Emergency Medicine | Admitting: Emergency Medicine

## 2017-01-09 ENCOUNTER — Emergency Department (HOSPITAL_COMMUNITY): Payer: BLUE CROSS/BLUE SHIELD

## 2017-01-09 DIAGNOSIS — J45909 Unspecified asthma, uncomplicated: Secondary | ICD-10-CM | POA: Diagnosis not present

## 2017-01-09 DIAGNOSIS — R0602 Shortness of breath: Secondary | ICD-10-CM | POA: Diagnosis not present

## 2017-01-09 DIAGNOSIS — R0789 Other chest pain: Secondary | ICD-10-CM | POA: Insufficient documentation

## 2017-01-09 DIAGNOSIS — R079 Chest pain, unspecified: Secondary | ICD-10-CM

## 2017-01-09 HISTORY — DX: Unspecified asthma, uncomplicated: J45.909

## 2017-01-09 LAB — BASIC METABOLIC PANEL
Anion gap: 9 (ref 5–15)
BUN: 19 mg/dL (ref 6–20)
CO2: 26 mmol/L (ref 22–32)
Calcium: 9.6 mg/dL (ref 8.9–10.3)
Chloride: 106 mmol/L (ref 101–111)
Creatinine, Ser: 0.84 mg/dL (ref 0.44–1.00)
GFR calc Af Amer: >60 mL/min (ref >60)
GFR calc non Af Amer: >60 mL/min (ref >60)
Glucose, Bld: 101 mg/dL — ABNORMAL HIGH (ref 65–99)
Potassium: 3.9 mmol/L (ref 3.5–5.1)
Sodium: 141 mmol/L (ref 135–145)

## 2017-01-09 LAB — CBC
HCT: 43.4 % (ref 36.0–46.0)
Hemoglobin: 14.9 g/dL (ref 12.0–15.0)
MCH: 32.3 pg (ref 26.0–34.0)
MCHC: 34.3 g/dL (ref 30.0–36.0)
MCV: 94.1 fL (ref 78.0–100.0)
Platelets: 242 10*3/uL (ref 150–400)
RBC: 4.61 MIL/uL (ref 3.87–5.11)
RDW: 12.5 % (ref 11.5–15.5)
WBC: 9.9 10*3/uL (ref 4.0–10.5)

## 2017-01-09 LAB — I-STAT TROPONIN, ED: Troponin i, poc: 0 ng/mL (ref 0.00–0.08)

## 2017-01-09 MED ORDER — CARISOPRODOL 350 MG PO TABS: 350.0000 mg | ORAL_TABLET | Freq: Three times a day (TID) | ORAL | 0 refills | Status: DC

## 2017-01-09 NOTE — Discharge Instructions (Signed)
Tests were completely normal. Suspect muscular pain. Recommend Tylenol or ibuprofen. Prescription for muscle relaxer.

## 2017-01-09 NOTE — ED Provider Notes (Signed)
[facility]-EM[facility]ENCY DEPT Provider Note   CSN: [id number] Arrival date & time: 01/09/17  7829   By signing my name below, I, Bobbie Stack, attest that this documentation has been prepared under the direction and in the presence of Donnetta Hutching, MD. Electronically Signed: Bobbie Stack, Scribe. 01/09/17. 9:09 AM. History   Chief Complaint Chief Complaint  Patient presents with  . Chest Pain    The history is provided by the patient. No language interpreter was used.  HPI Comments: Tonya Boyd is a 60 y.o. female who presents to the Emergency Department complaining of right-sided CP with associated SOB for the past 2 days. Her pain worsens with deep breaths. She describes the pain as a "burning" sensation. Her pain is not related to exertion or exercise. She denies dyspnea, nausea, or diaphoresis. she is a non-smoker. He has a hx of interstitial cystitis. Her pain is currently mild to moderate.  Past Medical History:  Diagnosis Date  . Asthma     Patient Active Problem List   Diagnosis Date Noted  . Iliotibial band syndrome of left side 10/21/2011    Past Surgical History:  Procedure Laterality Date  . ANKLE SURGERY     left  . CHOLECYSTECTOMY    . KNEE SURGERY     right  . SHOULDER SURGERY     bilateral  . SPINE SURGERY     C spine    OB History    No data available       Home Medications    Prior to Admission medications   Medication Sig Start Date End Date Taking? Authorizing Provider  ibuprofen (ADVIL,MOTRIN) 200 MG tablet Take 400 mg by mouth every 6 (six) hours as needed for mild pain.   Yes [provider]  triazolam (HALCION) 0.25 MG tablet Take 0.25 mg by mouth at bedtime as needed.   Yes [provider]  carisoprodol (SOMA) 350 MG tablet Take 1 tablet (350 mg total) by mouth 3 (three) times daily. 01/09/17   Donnetta Hutching, MD    Family History Family History  Problem Relation Age of Onset  . Heart disease    . Arthritis    .  Cancer    . Asthma    . Diabetes    . Kidney disease      Social History Social History  Substance Use Topics  . Smoking status: Never Smoker  . Smokeless tobacco: Never Used  . Alcohol use No     Allergies   Metoclopramide hcl; Nsaids; and Oxycodone-acetaminophen   Review of Systems Review of Systems A complete 10 system review of systems was obtained and all systems are negative except as noted in the HPI and PMH.   Physical Exam Updated Vital Signs BP 126/70 (BP Location: Left Arm)   Pulse 89   Temp 97.6 F (36.4 C) (Oral)   Resp 17   SpO2 97%   Physical Exam  Constitutional: She is oriented to person, place, and time. She appears well-developed and well-nourished.  HENT:  Head: Normocephalic and atraumatic.  Eyes: Conjunctivae are normal.  Neck: Neck supple.  Cardiovascular: Normal rate and regular rhythm.   Pulmonary/Chest: Effort normal and breath sounds normal. She exhibits tenderness.  Tender on right anterior chest wall.  Abdominal: Soft. Bowel sounds are normal.  Musculoskeletal: Normal range of motion.  Neurological: She is alert and oriented to person, place, and time.  Skin: Skin is warm and dry.  Psychiatric: She has a normal mood and affect.  Her behavior is normal.  Nursing note and vitals reviewed.    ED Treatments / Results  DIAGNOSTIC STUDIES: Oxygen Saturation is 98% on RA, normal by my interpretation.    COORDINATION OF CARE: 7:40 AM Discussed treatment plan with pt at bedside and pt agreed to plan. I will do a full cardiac work-up.  Labs (all labs ordered are listed, but only abnormal results are displayed) Labs Reviewed  BASIC METABOLIC PANEL - Abnormal; Notable for the following:       Result Value   Glucose, Bld 101 (*)    All other components within normal limits  CBC  I-STAT TROPOININ, ED    EKG  EKG Interpretation  Date/Time:  Saturday Jan 09 2017 06:41:09 EDT Ventricular Rate:  94 PR Interval:    QRS  Duration: 91 QT Interval:  391 QTC Calculation: 489 R Axis:   -23 Text Interpretation:  Sinus rhythm Borderline left axis deviation Probable anterior infarct, age indeterminate No significant change since last tracing Confirmed by [facility]  MD, CHRISTOPHER [id number]) on 01/09/2017 6:45:09 AM       Radiology Dg Chest 2 View  Result Date: 01/09/2017 CLINICAL DATA:  Chest pain EXAM: CHEST  2 VIEW COMPARISON:  08/11/2016 chest radiograph. FINDINGS: Partially visualized surgical hardware from ACDF overlying the lower cervical spine. Stable cardiomediastinal silhouette with normal heart size. No pneumothorax. No pleural effusion. Lungs appear clear, with no acute consolidative airspace disease and no pulmonary edema. Cholecystectomy clips are seen in the right upper quadrant of the abdomen. IMPRESSION: No active cardiopulmonary disease. Electronically Signed   By: Delbert PhenixJason A Poff M.D.   On: 01/09/2017 08:35    Procedures Procedures (including critical care time)  Medications Ordered in ED Medications - No data to display   Initial Impression / Assessment and Plan / ED Course  I have reviewed the triage vital signs and the nursing notes.  Pertinent labs & imaging results that were available during my care of the patient were reviewed by me and considered in my medical decision making (see chart for details).     Patient is low risk for ACS or PE. She is tender to palpation on her right chest wall and her pain is worse with inspiration. Screening labs, troponin, chest x-ray, EKG all normal. Suspect musculoskeletal pain. Discharge medications Soma 350 mg.  Final Clinical Impressions(s) / ED Diagnoses   Final diagnoses:  Chest pain, unspecified type    New Prescriptions New Prescriptions   CARISOPRODOL (SOMA) 350 MG TABLET    Take 1 tablet (350 mg total) by mouth 3 (three) times daily.   I personally performed the services described in this documentation, which was scribed in my presence. The  recorded information has been reviewed and is accurate.     Donnetta Hutchingook, Yolander Goodie, MD 01/09/17 [phone]

## 2017-01-09 NOTE — ED Triage Notes (Signed)
Pt c/o right sided chest pain x 2 days with sob.

## 2017-01-09 NOTE — ED Notes (Signed)
Pt states that the pain worsens when she is up and moving around.

## 2018-06-08 ENCOUNTER — Other Ambulatory Visit: Payer: Self-pay | Admitting: Family Medicine

## 2018-06-08 DIAGNOSIS — Z1231 Encounter for screening mammogram for malignant neoplasm of breast: Secondary | ICD-10-CM

## 2018-07-08 ENCOUNTER — Encounter: Payer: Self-pay | Admitting: Internal Medicine

## 2018-07-08 ENCOUNTER — Ambulatory Visit: Payer: BLUE CROSS/BLUE SHIELD | Admitting: Internal Medicine

## 2018-07-08 ENCOUNTER — Other Ambulatory Visit (INDEPENDENT_AMBULATORY_CARE_PROVIDER_SITE_OTHER): Payer: BLUE CROSS/BLUE SHIELD

## 2018-07-08 VITALS — BP 120/74 | HR 93 | Ht 64.5 in | Wt 182.2 lb

## 2018-07-08 DIAGNOSIS — R05 Cough: Secondary | ICD-10-CM

## 2018-07-08 DIAGNOSIS — R058 Other specified cough: Secondary | ICD-10-CM

## 2018-07-08 DIAGNOSIS — Z8679 Personal history of other diseases of the circulatory system: Secondary | ICD-10-CM | POA: Diagnosis not present

## 2018-07-08 DIAGNOSIS — I10 Essential (primary) hypertension: Secondary | ICD-10-CM

## 2018-07-08 DIAGNOSIS — R0609 Other forms of dyspnea: Secondary | ICD-10-CM | POA: Diagnosis not present

## 2018-07-08 DIAGNOSIS — R0600 Dyspnea, unspecified: Secondary | ICD-10-CM

## 2018-07-08 DIAGNOSIS — R000 Tachycardia, unspecified: Secondary | ICD-10-CM | POA: Diagnosis not present

## 2018-07-08 DIAGNOSIS — R053 Chronic cough: Secondary | ICD-10-CM

## 2018-07-08 DIAGNOSIS — I472 Ventricular tachycardia: Secondary | ICD-10-CM

## 2018-07-08 DIAGNOSIS — I4729 Other ventricular tachycardia: Secondary | ICD-10-CM

## 2018-07-08 HISTORY — DX: Ventricular tachycardia: I47.2

## 2018-07-08 HISTORY — DX: Other ventricular tachycardia: I47.29

## 2018-07-08 HISTORY — DX: Essential (primary) hypertension: I10

## 2018-07-08 LAB — CBC WITH DIFFERENTIAL/PLATELET
Basophils Absolute: 0.1 10*3/uL (ref 0.0–0.1)
Basophils Relative: 0.9 % (ref 0.0–3.0)
Eosinophils Absolute: 0.1 10*3/uL (ref 0.0–0.7)
Eosinophils Relative: 1.5 % (ref 0.0–5.0)
HCT: 41.8 % (ref 36.0–46.0)
Hemoglobin: 14.3 g/dL (ref 12.0–15.0)
Lymphocytes Relative: 15 % (ref 12.0–46.0)
Lymphs Abs: 1.2 10*3/uL (ref 0.7–4.0)
MCHC: 34.2 g/dL (ref 30.0–36.0)
MCV: 95.7 fl (ref 78.0–100.0)
Monocytes Absolute: 0.6 10*3/uL (ref 0.1–1.0)
Monocytes Relative: 7.4 % (ref 3.0–12.0)
Neutro Abs: 6.1 10*3/uL (ref 1.4–7.7)
Neutrophils Relative %: 75.2 % (ref 43.0–77.0)
Platelets: 267 10*3/uL (ref 150.0–400.0)
RBC: 4.37 Mil/uL (ref 3.87–5.11)
RDW: 12.6 % (ref 11.5–15.5)
WBC: 8.2 10*3/uL (ref 4.0–10.5)

## 2018-07-08 LAB — SEDIMENTATION RATE: Sed Rate: 24 mm/hr (ref 0–30)

## 2018-07-08 NOTE — Patient Instructions (Addendum)
ICD-10-CM   1. Dyspnea on exertion R06.09   2. Chronic cough R05   3. Nocturnal cough R05   4. History of Raynaud's syndrome Z86.79   5. Tachycardia R00.0     Need to differentiate asthma +/- new onse of ILD in lung esp with your above history Has resting sinus tachycardia +   PLAN  Do Walk test in the office 07/08/2018   Do CBC with diff, IgE and blood allergy profile   Serum: ESR, ACE, ANA, DS-DNA, RF, anti-CCP,  ANCA screen, MPO, PR-3, Total CK,  Aldolase,  ENP Panel ( ensure includes -> scl-70, ssA, ssB, anti-RNP, anti-JO-1, anti-smith antibody, anti-centromere, anti-histone) , Lupus antiocoagulant panel  Do HRCT supine and prone - next few to several weeks  Do full PFT - next few to several weeks  Echo - 2D  Followup  - next few to several weesk with Dr Chase Caller or AP[facility] to review

## 2018-07-08 NOTE — Addendum Note (Signed)
Addended by: Ebony Cargo on: 07/08/2018 10:44 AM   Modules accepted: Orders

## 2018-07-08 NOTE — Progress Notes (Signed)
Subjective:    Patient ID: Tonya Boyd, female    DOB: July 10, 1956, 62 y.o.   MRN: [id number]  HPI  IOV 07/08/2018  Chief Complaint  Patient presents with  . Pulm Consult    Referred by Dr. Ashby Dawes for possible asthma. Increased in SOB with activity. Patient states this has been going for years.     Tonya Boyd presents with her husband my capital.  She is a Heritage manager at [employer].  She remembered me from a phone conversation when I took care of her father.  Her main issues that she is having respiratory symptomatology.  She tells me that in the background she has had Raynaud of her fingers and hands for 20 years.  It gets worse in cold weather.  She also reports a long history of asthma when she was born but she says she outgrew it but that in the last few years she has had chronic cough particularly at night but also some present in the day.  She wake up in the middle of the night on and off with coughing and shortness of breath and choking for air.  There is some mild associated acid reflux.  But she never associated the symptoms to acid reflux.  She relates that this was because of asthma.  The symptoms have progressively gotten worsened particularly in the last 6 months or so where she is waking up more frequently but still may be a few times each month.  Then in the last 6 months has had insidious onset of shortness of breath but gets worse with exertion and relieved by rest.  Activities of daily living like working in the house to make a short of breath.  There is some wheezing but cough and shortness of breath are the most dominant set of symptoms.  Some 3 weeks ago she picked up her respiratory exacerbation.  She had a chest x-ray at the physician assistant at [facility].  Apparently the base of the lungs was described as abnormal.  I personally visualized this and to confirm the presence of hyperinflation and bilateral bibasal  atelectasis.  Which she says is not normal.  In looking back she might of developed these abnormalities a few years ago.  She is worried about this and therefore here.  The most recent x-ray was June 21, 2018.  I personally visualized this and agree with the findings. Lab work shows hemoglobin 14.9 g% May 2018.  EKG 2018 personally visualized and shows heart rate 94 and sinus tachycardia   Simple office walk 185 feet x  3 laps goal with forehead probe 07/08/2018   O2 used Room air  Number laps completed 3  Comments about pace normal  Resting Pulse Ox/HR 99% and 118/min  Final Pulse Ox/HR 98% and 111/min  Desaturated </= 88% no  Desaturated <= 3% points no  Got Tachycardic >/= 90/min Tachycardic even at rest  Symptoms at end of test Mild dyspnea at 2nd lap but pusjed through  Miscellaneous comments tachy        has a past medical history of Asthma.   reports that she has never smoked. She has never used smokeless tobacco.  Past Surgical History:  Procedure Laterality Date  . ANKLE SURGERY     left  . CHOLECYSTECTOMY    . KNEE SURGERY     right  . SHOULDER SURGERY     bilateral  . SPINE SURGERY     C  spine    Allergies  Allergen Reactions  . Diovan [Valsartan]     Tingling   . Keflex [Cephalexin]     confusion  . Metoclopramide Hcl   . Morphine And Related     hyper  . Norvasc [Amlodipine Besylate]     Numbness and tingling   . Nsaids   . Oxycodone-Acetaminophen   . Prednisone     Elevated BP  . Reglan [Metoclopramide]     confusion  . Singulair [Montelukast]     diarrhea     There is no immunization history on file for this patient.  Family History  Problem Relation Age of Onset  . Heart disease Unknown   . Arthritis Unknown   . Cancer Unknown   . Asthma Unknown   . Diabetes Unknown   . Kidney disease Unknown      Current Outpatient Medications:  .  albuterol (PROVENTIL) (2.5 MG/3ML) 0.083% nebulizer solution, , Disp: , Rfl: 5 .   cefUROXime (CEFTIN) 500 MG tablet, , Disp: , Rfl: 0 .  mometasone-formoterol (DULERA) 100-5 MCG/ACT AERO, Inhale 2 puffs into the lungs 2 (two) times daily., Disp: , Rfl:  .  oxybutynin (DITROPAN) 5 MG tablet, , Disp: , Rfl: 1 .  Vitamin D, Ergocalciferol, (DRISDOL) 50000 units CAPS capsule, , Disp: , Rfl: 5      has a past medical history of Asthma.   reports that she has never smoked. She has never used smokeless tobacco.  Past Surgical History:  Procedure Laterality Date  . ANKLE SURGERY     left  . CHOLECYSTECTOMY    . KNEE SURGERY     right  . SHOULDER SURGERY     bilateral  . SPINE SURGERY     C spine    Allergies  Allergen Reactions  . Diovan [Valsartan]     Tingling   . Keflex [Cephalexin]     confusion  . Metoclopramide Hcl   . Morphine And Related     hyper  . Norvasc [Amlodipine Besylate]     Numbness and tingling   . Nsaids   . Oxycodone-Acetaminophen   . Prednisone     Elevated BP  . Reglan [Metoclopramide]     confusion  . Singulair [Montelukast]     diarrhea     There is no immunization history on file for this patient.  Family History  Problem Relation Age of Onset  . Heart disease Unknown   . Arthritis Unknown   . Cancer Unknown   . Asthma Unknown   . Diabetes Unknown   . Kidney disease Unknown      Current Outpatient Medications:  .  albuterol (PROVENTIL) (2.5 MG/3ML) 0.083% nebulizer solution, , Disp: , Rfl: 5 .  cefUROXime (CEFTIN) 500 MG tablet, , Disp: , Rfl: 0 .  mometasone-formoterol (DULERA) 100-5 MCG/ACT AERO, Inhale 2 puffs into the lungs 2 (two) times daily., Disp: , Rfl:  .  oxybutynin (DITROPAN) 5 MG tablet, , Disp: , Rfl: 1 .  Vitamin D, Ergocalciferol, (DRISDOL) 50000 units CAPS capsule, , Disp: , Rfl: 5   Review of Systems  Constitutional: Negative for fever and unexpected weight change.  HENT: Negative for congestion, dental problem, ear pain, nosebleeds, postnasal drip, rhinorrhea, sinus pressure, sneezing, sore  throat and trouble swallowing.   Eyes: Negative for redness and itching.  Respiratory: Positive for shortness of breath. Negative for cough, chest tightness and wheezing.   Cardiovascular: Negative for palpitations and leg swelling.  Gastrointestinal: Negative  for nausea and vomiting.  Genitourinary: Negative for dysuria.  Musculoskeletal: Negative for joint swelling.  Skin: Negative for rash.  Allergic/Immunologic: Negative.  Negative for environmental allergies, food allergies and immunocompromised state.  Neurological: Negative for headaches.  Hematological: Does not bruise/bleed easily.  Psychiatric/Behavioral: Negative for dysphoric mood. The patient is not nervous/anxious.        Objective:   Physical Exam   Vitals:   07/08/18 0900  BP: 120/74  Pulse: 93  SpO2: 93%  Weight: 182 lb 3.2 oz (82.6 kg)  Height: 5' 4.5" (1.638 m)    Estimated body mass index is 30.79 kg/m as calculated from the following:   Height as of this encounter: 5' 4.5" (1.638 m).   Weight as of this encounter: 182 lb 3.2 oz (82.6 kg).  General Appearance:  Looks well Head:  Normocephalic, without obvious abnormality, atraumatic Eyes:  PERRL - yes, conjunctiva/corneas - clea     Ears:  Normal external ear canals, both ears Nose:  G tube - no Throat:  ETT TUBE - no , OG tube - no, Has left neck scar Neck:  Supple,  No enlargement/tenderness/nodules Lungs: Clear to auscultation bilaterally, but R > L base mild crackles + Heart:  S1 and S2 normal, no murmur, CVP - no.  Pressors - n Abdomen:  Soft, no masses, no organomegaly Genitalia / Rectal:  Not done Extremities:  Extremities- intact Skin:  ntact in exposed areas . Sacral area - no Neurologic:  Sedation - nond -> RASS - +1 . Moves all 4s - yes. CAM-ICU - neg . Orientation - x3 +        Assessment & Plan:     ICD-10-CM   1. Dyspnea on exertion R06.09   2. Chronic cough R05   3. Nocturnal cough R05   4. History of Raynaud's syndrome  Z86.79   5. Tachycardia R00.0      Patient Instructions     ICD-10-CM   1. Dyspnea on exertion R06.09   2. Chronic cough R05   3. Nocturnal cough R05   4. History of Raynaud's syndrome Z86.79   5. Tachycardia R00.0     Need to differentiate asthma +/- new onse of ILD in lung esp with your above history Has resting sinus tachycardia +   PLAN  Do Walk test in the office 07/08/2018   Do CBC with diff, IgE and blood allergy profile   Serum: ESR, ACE, ANA, DS-DNA, RF, anti-CCP,  ANCA screen, MPO, PR-3, Total CK,  Aldolase,  ENP Panel ( ensure includes -> scl-70, ssA, ssB, anti-RNP, anti-JO-1, anti-smith antibody, anti-centromere, anti-histone) , Lupus antiocoagulant panel  Do HRCT supine and prone - next few to several weeks  Do full PFT - next few to several weeks  Echo - 2D  Followup  - next few to several weesk with Dr Chase Caller or APp to review   might need other tachyardia workup by PCP depending on above  SIGNATURE    Dr. Brand Males, M.D., F.C.C.P,  Pulmonary and Critical Care Medicine Staff Physician, [place] Director - Interstitial Lung Disease  Program  [facility] [place], [place], [address]  Pager: [phone], If no answer or between  15:00h - 7:00h: call [phone] Telephone: [phone]  9:56 AM 07/08/2018

## 2018-07-08 NOTE — Addendum Note (Signed)
Addended by: Maurene Capes on: 07/08/2018 10:04 AM   Modules accepted: Orders

## 2018-07-08 NOTE — Addendum Note (Signed)
Addended by: Maurene Capes on: 07/08/2018 10:08 AM   Modules accepted: Orders

## 2018-07-11 LAB — ANGIOTENSIN CONVERTING ENZYME: Angiotensin-Converting Enzyme: 23 U/L (ref 9–67)

## 2018-07-11 LAB — RESPIRATORY ALLERGY PROFILE REGION II ~~LOC~~
Allergen, A. alternata, m6: <0.1 kU/L
Allergen, Cedar tree, t12: <0.1 kU/L
Allergen, Comm Silver Birch, t9: <0.1 kU/L
Allergen, Cottonwood, t14: <0.1 kU/L
Allergen, D pternoyssinus,d7: 0.2 kU/L — ABNORMAL HIGH
Allergen, Mouse Urine Protein, e78: <0.1 kU/L
Allergen, Mulberry, t76: <0.1 kU/L
Allergen, Oak,t7: <0.1 kU/L
Allergen, P. notatum, m1: <0.1 kU/L
Aspergillus fumigatus, m3: <0.1 kU/L
Bermuda Grass: <0.1 kU/L
Box Elder IgE: <0.1 kU/L
CLADOSPORIUM HERBARUM (M2) IGE: <0.1 kU/L
COMMON RAGWEED (SHORT) (W1) IGE: <0.1 kU/L
Cat Dander: <0.1 kU/L
Class: 0
Class: 0
Class: 0
Class: 0
Class: 0
Class: 0
Class: 0
Class: 0
Class: 0
Class: 0
Class: 0
Class: 0
Class: 0
Class: 0
Class: 0
Class: 0
Class: 0
Class: 0
Class: 0
Class: 0
Class: 0
Class: 0
Class: 0
Class: 0
Cockroach: 0.16 kU/L — ABNORMAL HIGH
D. farinae: 0.17 kU/L — ABNORMAL HIGH
Dog Dander: <0.1 kU/L
Elm IgE: <0.1 kU/L
IgE (Immunoglobulin E), Serum: 249 kU/L — ABNORMAL HIGH (ref <=114)
Johnson Grass: <0.1 kU/L
Pecan/Hickory Tree IgE: <0.1 kU/L
Rough Pigweed  IgE: <0.1 kU/L
Sheep Sorrel IgE: <0.1 kU/L
Timothy Grass: <0.1 kU/L

## 2018-07-11 LAB — CK TOTAL AND CKMB (NOT AT ARMC)
CK, MB: 1.5 ng/mL (ref 0–5.0)
Relative Index: 1.6 (ref 0–4.0)
Total CK: 91 U/L (ref 29–143)

## 2018-07-11 LAB — ANA: Anti Nuclear Antibody(ANA): NEGATIVE

## 2018-07-11 LAB — INTERPRETATION:

## 2018-07-12 LAB — ANA+ENA+DNA/DS+SCL 70+SJOSSA/B
ANA Titer 1: NEGATIVE
ENA RNP Ab: <0.2 AI (ref 0.0–0.9)
ENA SM Ab Ser-aCnc: <0.2 AI (ref 0.0–0.9)
ENA SSA (RO) Ab: <0.2 AI (ref 0.0–0.9)
ENA SSB (LA) Ab: <0.2 AI (ref 0.0–0.9)
Scleroderma SCL-70: <0.2 AI (ref 0.0–0.9)
dsDNA Ab: 3 IU/mL (ref 0–9)

## 2018-07-12 LAB — LUPUS ANTICOAGULANT PANEL
Dilute Viper Venom Time: 39.6 s (ref 0.0–47.0)
PTT Lupus Anticoagulant: 40 s (ref 0.0–51.9)

## 2018-07-13 ENCOUNTER — Ambulatory Visit (INDEPENDENT_AMBULATORY_CARE_PROVIDER_SITE_OTHER): Payer: BLUE CROSS/BLUE SHIELD | Admitting: Internal Medicine

## 2018-07-13 ENCOUNTER — Other Ambulatory Visit: Payer: Self-pay

## 2018-07-13 ENCOUNTER — Ambulatory Visit (HOSPITAL_COMMUNITY): Payer: BLUE CROSS/BLUE SHIELD | Attending: Cardiology

## 2018-07-13 DIAGNOSIS — R0600 Dyspnea, unspecified: Secondary | ICD-10-CM

## 2018-07-13 DIAGNOSIS — R0609 Other forms of dyspnea: Secondary | ICD-10-CM | POA: Insufficient documentation

## 2018-07-13 LAB — PULMONARY FUNCTION TEST
DL/VA % pred: 127 %
DL/VA: 6.22 ml/min/mmHg/L
DLCO cor % pred: 94 %
DLCO cor: 23.63 ml/min/mmHg
DLCO unc % pred: 97 %
DLCO unc: 24.26 ml/min/mmHg
FEF 25-75 Post: 1.57 L/sec
FEF 25-75 Pre: 2.26 L/sec
FEF2575-%Change-Post: -30 %
FEF2575-%Pred-Post: 67 %
FEF2575-%Pred-Pre: 96 %
FEV1-%Change-Post: -7 %
FEV1-%Pred-Post: 68 %
FEV1-%Pred-Pre: 73 %
FEV1-Post: 1.75 L
FEV1-Pre: 1.9 L
FEV1FVC-%Change-Post: -2 %
FEV1FVC-%Pred-Pre: 110 %
FEV6-%Change-Post: -5 %
FEV6-%Pred-Post: 64 %
FEV6-%Pred-Pre: 68 %
FEV6-Post: 2.09 L
FEV6-Pre: 2.2 L
FEV6FVC-%Pred-Post: 103 %
FEV6FVC-%Pred-Pre: 103 %
FVC-%Change-Post: -5 %
FVC-%Pred-Post: 62 %
FVC-%Pred-Pre: 65 %
FVC-Post: 2.09 L
FVC-Pre: 2.2 L
Post FEV1/FVC ratio: 84 %
Post FEV6/FVC ratio: 100 %
Pre FEV1/FVC ratio: 86 %
Pre FEV6/FVC Ratio: 100 %
RV % pred: 98 %
RV: 2.01 L
TLC % pred: 82 %
TLC: 4.23 L

## 2018-07-13 NOTE — Progress Notes (Signed)
PFT done today. 

## 2018-07-14 ENCOUNTER — Ambulatory Visit: Payer: BLUE CROSS/BLUE SHIELD

## 2018-07-18 NOTE — Progress Notes (Signed)
@Patient  ID: Drucie Ip, female    DOB: 26-Jul-1956, 62 y.o.   MRN: [id number]  Chief Complaint  Patient presents with  . Follow-up    SOB - 2 week follow up    Referring provider: Irena Reichmann, DO  HPI:  62 year old female never smoker initially referred to our office on 07/08/2018 for progressive shortness of breath over the last 2 years.   Smoker/ Smoking History: Never Smoker  Maintenance:  None right now  Pt of: Dr. Marchelle Gearing  07/19/2018  - Visit   62 year old female patient presenting today for follow-up visit.  Patient was recently seen by Dr. Marchelle Gearing on 07/08/2018.  Patient had extensive work-up to rule out asthma versus ILD.  Patient is completed all of lab work and follow-up today.  Patient did complete high-res CT at [facility] facility instead of in a [place] system.  She has a disc today but the images have not been reviewed by our thoracic radiologist per ILD protocol or Dr. Marchelle Gearing or myself.  Patient reports that high-res CT images and imaging report stated that she has a pulmonary nodule and needs follow-up in 1 year but did not see any interstitial lung disease.  I have not viewed this report myself.  Patient also reporting that symptoms have not improved since last office visit.  Patient did forget to let us know that she has had a bird for the last 2 years and her symptoms have worsened over that time when she is had the bird.  Patient attributes her last 2 years of shortness of breath and worsening symptoms potentially to having a bird.   Patient is currently stopped her Dulera 100 as she did not want to take any medications while she is being worked up by Dr. Marchelle Gearing.  Patient reports she is also seeing Dr. Jacinto Halim with cardiology this week.   Tests:  07/08/2018-CBC with differential-eosinophils absolute 0.1, eosinophils relative 1.5, hemoglobin 14.3 07/08/2018-respiratory allergy panel- elevated for dust mites, cockroach, IgE 249 07/08/2018- CK total and  CK-MB, normal, ANA with titer negative 07/08/2018-80s-23 07/08/2018-sed rate-24 07/08/2018- lupus anticoagulant panel- normal no lupus anticoagulant was detected  Patient still has not completed CT chest high-res  07/13/2018-pulmonary function test-FVC 2.2 (65% predicted), postbronchodilator ratio 84, FEV1 73, no significant bronchodilator response, DLCO 94  07/13/2018-echocardiogram-LV ejection fraction 55 to 60%, grade 1 diastolic dysfunction  FENO:  No results found for: NITRICOXIDE  PFT: PFT Results Latest Ref Rng & Units 07/13/2018  FVC-Pre L 2.20  FVC-Predicted Pre % 65  FVC-Post L 2.09  FVC-Predicted Post % 62  Pre FEV1/FVC % % 86  Post FEV1/FCV % % 84  FEV1-Pre L 1.90  FEV1-Predicted Pre % 73  FEV1-Post L 1.75  DLCO UNC% % 97  DLCO COR %Predicted % 127  TLC L 4.23  TLC % Predicted % 82  RV % Predicted % 98    Imaging: No results found.  Chart Review:    Specialty Problems      Pulmonary Problems   Dyspnea on exertion    07/08/2018-CBC with differential-eosinophils absolute 0.1, eosinophils relative 1.5, hemoglobin 14.3 07/08/2018-respiratory allergy panel- elevated for dust mites, cockroach, IgE 249 07/08/2018- CK total and CK-MB, normal, ANA with titer negative 07/08/2018-80s-23 07/08/2018-sed rate-24 07/08/2018- lupus anticoagulant panel- normal no lupus anticoagulant was detected  07/13/2018-pulmonary function test-FVC 2.2 (65% predicted), postbronchodilator ratio 84, FEV1 73, no significant bronchodilator response, DLCO 94  07/13/2018-echocardiogram-LV ejection fraction 55 to 60%, grade 1 diastolic dysfunction  Allergies  Allergen Reactions  . Diovan [Valsartan]     Tingling   . Keflex [Cephalexin]     confusion  . Metoclopramide Hcl   . Morphine And Related     hyper  . Norvasc [Amlodipine Besylate]     Numbness and tingling   . Nsaids   . Oxycodone-Acetaminophen   . Prednisone     Elevated BP  . Reglan [Metoclopramide]     confusion  .  Singulair [Montelukast]     diarrhea    Immunization History  Administered Date(s) Administered  . Influenza Whole 06/21/2018    Past Medical History:  Diagnosis Date  . Asthma     Tobacco History: Social History   Tobacco Use  Smoking Status Never Smoker  Smokeless Tobacco Never Used   Counseling given: Not Answered  Continue to not smoke  Outpatient Encounter Medications as of 07/19/2018  Medication Sig  . cefUROXime (CEFTIN) 500 MG tablet   . oxybutynin (DITROPAN) 5 MG tablet   . Vitamin D, Ergocalciferol, (DRISDOL) 50000 units CAPS capsule   . albuterol (PROVENTIL) (2.5 MG/3ML) 0.083% nebulizer solution   . mometasone-formoterol (DULERA) 100-5 MCG/ACT AERO Inhale 2 puffs into the lungs 2 (two) times daily.   No facility-administered encounter medications on file as of 07/19/2018.      Review of Systems  Review of Systems  Constitutional: Positive for fatigue. Negative for chills, fever and unexpected weight change.  HENT: Negative for congestion, ear pain, postnasal drip, sinus pressure and sinus pain.   Respiratory: Positive for shortness of breath. Negative for cough, chest tightness and wheezing.   Cardiovascular: Negative for chest pain and palpitations.  Gastrointestinal: Negative for blood in stool, diarrhea, nausea and vomiting.  Musculoskeletal: Negative for arthralgias.  Skin: Negative for color change.  Allergic/Immunologic: Positive for environmental allergies. Negative for food allergies.  Psychiatric/Behavioral: Negative for dysphoric mood. The patient is not nervous/anxious.   All other systems reviewed and are negative.    Physical Exam  BP 138/84 (BP Location: Left Arm, Cuff Size: Normal)   Pulse (!) 114   Ht 5' 4.5" (1.638 m)   Wt 183 lb 6.4 oz (83.2 kg)   SpO2 100%   BMI 30.99 kg/m   Wt Readings from Last 5 Encounters:  07/19/18 183 lb 6.4 oz (83.2 kg)  07/08/18 182 lb 3.2 oz (82.6 kg)  10/26/11 140 lb (63.5 kg)  10/21/11 140 lb  (63.5 kg)  10/06/11 140 lb (63.5 kg)    Physical Exam  Constitutional: She is oriented to person, place, and time and well-developed, well-nourished, and in no distress. No distress.  HENT:  Head: Normocephalic and atraumatic.  Right Ear: Hearing, external ear and ear canal normal.  Left Ear: Hearing, external ear and ear canal normal.  Nose: Mucosal edema present. Right sinus exhibits no maxillary sinus tenderness and no frontal sinus tenderness. Left sinus exhibits no maxillary sinus tenderness and no frontal sinus tenderness.  Mouth/Throat: Uvula is midline and oropharynx is clear and moist. No oropharyngeal exudate.  + TMs with effusion without infection bilaterally  Eyes: Pupils are equal, round, and reactive to light.  Neck: Normal range of motion. Neck supple.  Cardiovascular: Normal rate, regular rhythm and normal heart sounds.  Pulmonary/Chest: Effort normal and breath sounds normal. No accessory muscle usage. No respiratory distress. She has no decreased breath sounds. She has no wheezes. She has no rhonchi.  Musculoskeletal: Normal range of motion. She exhibits no edema.  Lymphadenopathy:    She has  no cervical adenopathy.  Neurological: She is alert and oriented to person, place, and time. Gait normal.  Skin: Skin is warm and dry. She is not diaphoretic. No erythema.  Psychiatric: Mood, memory, affect and judgment normal.  Nursing note and vitals reviewed.     Lab Results:  CBC    Component Value Date/Time   WBC 8.2 07/08/2018 1044   RBC 4.37 07/08/2018 1044   HGB 14.3 07/08/2018 1044   HCT 41.8 07/08/2018 1044   PLT 267.0 07/08/2018 1044   MCV 95.7 07/08/2018 1044   MCH 32.3 01/09/2017 0708   MCHC 34.2 07/08/2018 1044   RDW 12.6 07/08/2018 1044   LYMPHSABS 1.2 07/08/2018 1044   MONOABS 0.6 07/08/2018 1044   EOSABS 0.1 07/08/2018 1044   BASOSABS 0.1 07/08/2018 1044    BMET    Component Value Date/Time   NA 141 01/09/2017 0708   K 3.9 01/09/2017 0708    CL 106 01/09/2017 0708   CO2 26 01/09/2017 0708   GLUCOSE 101 (H) 01/09/2017 0708   BUN 19 01/09/2017 0708   CREATININE 0.84 01/09/2017 0708   CALCIUM 9.6 01/09/2017 0708   GFRNONAA >60 01/09/2017 0708   GFRAA >60 01/09/2017 0708    BNP No results found for: BNP  ProBNP No results found for: PROBNP    Assessment & Plan:   Pleasant 62 year old female patient completing follow-up with our office today.  I reviewed her lab work with the patient as well as pulmonary function test and echocardiogram.  Patient reports she is follow-up this week with Dr. Jacinto Halim for cardiology.  Will order hypersensitivity pneumonitis panel today as patient has has 2-year exposure to bird in her house.  I will work to get the patient's imaging CD added to PACS.  Patient completed a high-res CT outside of our facility at a [facility] facility due to cost.  I have opened a telephone encounter and routed to Dr. Marchelle Gearing so that he can follow for these results and contact the patient.  I have placed a referral to an allergist for further work-up due to her elevated IgE.  Patient follow-up in 2 to 3 months.  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 Marchelle Gearing will contact you regarding those results   Follow-up with Dr. Marchelle Gearing in 2 to 3 months  Elevated IgE level  Please avoid known triggers such as dust mites, cockroach, and potentially the bird that you have at home  Dust Mite Allergy  >>>levels of these increase in November / December  >>>this can heighten your bronchial hypersensitivity and increase chance of respiratory exacerbation  >>> Treatment options: Can get dust mite covers, dry  pillows weekly and dryer, in case mattress with cover, cleaning carpets regularly if you have them  Please resume Dulera 100 Dulera 100 >>> 2 puffs in the morning right when you wake up, rinse out your mouth after use, 12 hours later 2 puffs, rinse after use >>> Take this daily, no matter what >>> This is not a rescue inhaler   We will refer you to an allergist for management of the IgE level   You can start a daily antihistamine such as generic Zyrtec  Follow-up with Dr. Marchelle Gearing in 2 to 3 months  Dyspnea on exertion Hypersensitivity pneumonitis panel today  Please avoid known triggers such as dust mites, cockroach, and potentially the bird that you have at home  Dust Mite Allergy  >>>levels of these increase in November / December  >>>this can heighten your bronchial hypersensitivity and increase chance of respiratory exacerbation  >>> Treatment options: Can get dust mite covers, dry pillows weekly and dryer, in case mattress with cover, cleaning carpets regularly if you have them  Please resume Dulera 100 Dulera 100 >>> 2 puffs in the morning right when you wake up, rinse out your mouth after use, 12 hours later 2 puffs, rinse after use >>> Take this daily, no matter what >>> This is not a rescue inhaler   Please go to your car in a and obtain your CT images as well as imaging report we will get this uploaded into PACS our imaging site >>>Once it is uploaded then Dr. Marchelle Gearing will contact you regarding those results  We will refer you to an allergist for management of the IgE level   You can start a daily antihistamine such as generic Zyrtec   Follow-up with Dr. Marchelle Gearing in 2 to 3 months  This appointment was 48 minutes along with over 50% of that time in direct face-to-face patient care, assessment, plan of care follow-up, discussion of lab results and work-up   Coral Ceo, NP 07/19/2018

## 2018-07-19 ENCOUNTER — Encounter: Payer: Self-pay | Admitting: Pulmonary Disease

## 2018-07-19 ENCOUNTER — Telehealth: Payer: Self-pay | Admitting: Pulmonary Disease

## 2018-07-19 ENCOUNTER — Other Ambulatory Visit: Payer: BLUE CROSS/BLUE SHIELD

## 2018-07-19 ENCOUNTER — Ambulatory Visit: Payer: BLUE CROSS/BLUE SHIELD | Admitting: Pulmonary Disease

## 2018-07-19 DIAGNOSIS — R0609 Other forms of dyspnea: Principal | ICD-10-CM

## 2018-07-19 DIAGNOSIS — R768 Other specified abnormal immunological findings in serum: Secondary | ICD-10-CM | POA: Diagnosis not present

## 2018-07-19 DIAGNOSIS — R0600 Dyspnea, unspecified: Secondary | ICD-10-CM

## 2018-07-19 DIAGNOSIS — Z7729 Contact with and (suspected ) exposure to other hazardous substances: Secondary | ICD-10-CM

## 2018-07-19 NOTE — Patient Instructions (Addendum)
Hypersensitivity pneumonitis panel today  Please avoid known triggers such as dust mites, cockroach, and potentially the bird that you have at home  Dust Mite Allergy  >>>levels of these increase in November / December  >>>this can heighten your bronchial hypersensitivity and increase chance of respiratory exacerbation  >>> Treatment options: Can get dust mite covers, dry pillows weekly and dryer, in case mattress with cover, cleaning carpets regularly if you have them  Please resume Dulera 100 Dulera 100 >>> 2 puffs in the morning right when you wake up, rinse out your mouth after use, 12 hours later 2 puffs, rinse after use >>> Take this daily, no matter what >>> This is not a rescue inhaler   Please go to your car in a and obtain your CT images as well as imaging report we will get this uploaded into PACS our imaging site >>>Once it is uploaded then Dr. Marchelle Gearing will contact you regarding those results  We will refer you to an allergist for management of the IgE level   You can start a daily antihistamine such as generic Zyrtec   Follow-up with Dr. Marchelle Gearing in 2 to 3 months   November/2019 we will be moving! We will no longer be at our [place] location.  Be on the look out for a post card/mailer to let you know we have officially moved.  Our new address and phone number will be:  [address] [place], [place] [address] Telephone number: [phone]  It is flu season:   >>>Remember to be washing your hands regularly, using hand sanitizer, be careful to use around herself with has contact with people who are sick will increase her chances of getting sick yourself. >>> Best ways to protect herself from the flu: Receive the yearly flu vaccine, practice good hand hygiene washing with soap and also using hand sanitizer when available, eat a nutritious meals, get adequate rest, hydrate appropriately   Please contact the office if your symptoms worsen or you have concerns  that you are not improving.   Thank you for choosing [facility] for your healthcare, and for allowing [place] to partner with you on your healthcare journey. I am thankful to be able to provide care to you today.   Elisha Headland FNP-C

## 2018-07-19 NOTE — Assessment & Plan Note (Signed)
Hypersensitivity pneumonitis panel today  Please avoid known triggers such as dust mites, cockroach, and potentially the bird that you have at home  Please go to your car in a and obtain your CT images as well as imaging report we will get this uploaded into PACS our imaging site >>>Once it is uploaded then Dr. Marchelle Gearingamaswamy will contact you regarding those results   Follow-up with Dr. Marchelle Gearingamaswamy in 2 to 3 months

## 2018-07-19 NOTE — Telephone Encounter (Signed)
07/19/18 1602  Opening telephone encounter to follow patient's high-res CT images from no font to be uploaded into PACS.  Patient would like a telephone call from Dr. Marchelle Gearingamaswamy once he is reviewed these images once they are loaded into PACS.  Patient reports that there was a pulmonary nodule but that there is no interstitial lung disease seen.  Unfortunately until we get the images uploaded off the disc is no way for us to visualize and confirm this.  I discussed this with Dr. Marchelle Gearingamaswamy on 07/19/2018 and ALT clinic.  He requested telephone note to be started.  Elisha HeadlandBrian Shadd Dunstan FNP

## 2018-07-19 NOTE — Assessment & Plan Note (Signed)
  Please avoid known triggers such as dust mites, cockroach, and potentially the bird that you have at home  Dust Mite Allergy  >>>levels of these increase in November / December  >>>this can heighten your bronchial hypersensitivity and increase chance of respiratory exacerbation  >>> Treatment options: Can get dust mite covers, dry pillows weekly and dryer, in case mattress with cover, cleaning carpets regularly if you have them  Please resume Dulera 100 Dulera 100 >>> 2 puffs in the morning right when you wake up, rinse out your mouth after use, 12 hours later 2 puffs, rinse after use >>> Take this daily, no matter what >>> This is not a rescue inhaler   We will refer you to an allergist for management of the IgE level   You can start a daily antihistamine such as generic Zyrtec  Follow-up with Dr. Marchelle Gearingamaswamy in 2 to 3 months

## 2018-07-19 NOTE — Assessment & Plan Note (Signed)
Hypersensitivity pneumonitis panel today  Please avoid known triggers such as dust mites, cockroach, and potentially the bird that you have at home  Dust Mite Allergy  >>>levels of these increase in November / December  >>>this can heighten your bronchial hypersensitivity and increase chance of respiratory exacerbation  >>> Treatment options: Can get dust mite covers, dry pillows weekly and dryer, in case mattress with cover, cleaning carpets regularly if you have them  Please resume Dulera 100 Dulera 100 >>> 2 puffs in the morning right when you wake up, rinse out your mouth after use, 12 hours later 2 puffs, rinse after use >>> Take this daily, no matter what >>> This is not a rescue inhaler   Please go to your car in a and obtain your CT images as well as imaging report we will get this uploaded into PACS our imaging site >>>Once it is uploaded then Dr. Marchelle Gearingamaswamy will contact you regarding those results  We will refer you to an allergist for management of the IgE level   You can start a daily antihistamine such as generic Zyrtec   Follow-up with Dr. Marchelle Gearingamaswamy in 2 to 3 months

## 2018-07-19 NOTE — Telephone Encounter (Signed)
07/19/18 1602  Disc received in office today and placed in our office mail.  [facility]    Elisha HeadlandBrian Mack FNP

## 2018-07-21 NOTE — Progress Notes (Signed)
Thank you for the update. Will route to Dr. Marchelle Gearingamaswamy as well. We are waiting for CT images to be uploaded and Hypersensitivity Pneumonitis panel to be resulted.   Thanks for seeing her.   Elisha HeadlandBrian Mack FNP  [facility]

## 2018-07-22 LAB — HYPERSENSITIVITY PNEUMONITIS#5
A. Pullulans Abs: NEGATIVE
A.Fumigatus #1 Abs: NEGATIVE
Aspergillus Niger Antibodies: NEGATIVE
Micropolyspora faeni, IgG: NEGATIVE
Pigeon Serum Abs: NEGATIVE
Thermoact. Saccharii: NEGATIVE
Thermoactinomyces vulgaris, IgG: NEGATIVE

## 2018-07-22 NOTE — Progress Notes (Signed)
Hypersensitivity pneumonitis panel is negative.  Complete follow-up with the allergist that we have referred you to. Keep follow up with Dr. Marchelle Gearingamaswamy.  Elisha HeadlandBrian  FNP

## 2018-07-23 DIAGNOSIS — I5189 Other ill-defined heart diseases: Secondary | ICD-10-CM

## 2018-07-23 HISTORY — DX: Other ill-defined heart diseases: I51.89

## 2018-07-25 NOTE — Progress Notes (Signed)
That is her decision.  Our recommendations stand.  If symptoms worsen then she will need to reconsider her stance on seeing an allergist as well as having a bird in her house.  Elisha HeadlandBrian Mack FNP

## 2018-07-25 NOTE — Telephone Encounter (Signed)
Patient returned phone call; pt contact # [phone]

## 2018-07-25 NOTE — Progress Notes (Signed)
That would be at the patients own risk.  Patient did admit that her symptoms worsened and aligned up to when she had the bird in her house (about 2 years of symptoms, has had the bird for 2 years).  If the patient would like to have the bird house that is their choice I cannot safely say though that this will not affect her breathing.  As we know that having birds does increase her chance of having hypersensitivity pneumonitis and affecting her breathing chronically.  We are still waiting for her high-res CT images from no font to be uploaded into PACS this will allow us to further evaluate her breathing.  I would suggest that if the patient can emotionally handle it, to attempt to be without the bird for 6 to 8 weeks to see if her symptoms improve at all. Complete allergy follow up, notify allergist that you do have a bird and that breathing symptoms seem to worsen when the bird came to live with you, if you do bring the bird back into her house and breathing worsens then I would recommend that you remove the bird, keep follow up with Dr. Marchelle Gearingamaswamy.   Elisha HeadlandBrian Aleyah Balik FNP

## 2018-07-30 DIAGNOSIS — I4729 Other ventricular tachycardia: Secondary | ICD-10-CM

## 2018-07-30 DIAGNOSIS — I472 Ventricular tachycardia: Secondary | ICD-10-CM

## 2018-07-30 NOTE — H&P (Addendum)
OFFICE VISIT NOTES COPIED TO EPIC FOR DOCUMENTATION  . History of Present Illness Tonya Boyd; 07/29/2018 2:11 PM) Patient words: f/u palpitations, tests, last OV 07/20/18.  The patient is a 62 year old female who presents for a Follow-up for Palpitations. [name] is a pleasant Caucasian female with history of Raynaud's disease and GERD last seen by [place] in 2014 for chest pain that was concerning for coronary spasm.  Patient was seen by [place] 1 week ago for evaluation of tachycardia associated with shortness of breath. She was noted to have 8 beats of NSVT on event monitor. Was started on Cartia XT, but called our office a few days later stating heart rate was high and blood pressure was also high. I have started her on losartan 25 mg.   She continues to have tachycardia episodes with heart rate in the 120's and reports that she is very fatigued. Has chest pain and shortness of breath with activities. No syncope. Symptoms have been present for 1 year and have progressively worsened.  She drinks 2 small cups of coffee per day. She has had evaluation for connective tissue disorder that was negative. Does not exercise, but is active with her grandchildren. She continues to work in physician office.   Problem List/Past Medical Tonya Boyd Boyd; 07/29/2018 1:24 PM) Acid reflux (K21.9)  Chest pain, atypical (R07.89)  EKG 07/20/2018: Sinus tachycardia at the rate of 109 bpm, left axis deviation, poor R-wave progression, cannot exclude anterolateral infarct old. Nonspecific T abnormality. Low-voltage complexes. EKG 11/08/2012 NSR @ 87/min. Normal axis. IRBBB. Otherwise normal. Lexiscan Sestamibi stress test 07/22/2018: 1. Lexiscan stress test was performed. Exercise capacity was not assessed. Stress symptoms included chest pain, dyspnea, nausea, dizziness. Peak blood pressure was 148/98 mmHg. The resting and stress electrocardiogram demonstrated normal sinus rhythm, normal resting  conduction, no resting arrhythmias and normal rest repolarization. Stress EKG is non diagnostic for ischemia as it is a pharmacologic stress. 2. The overall quality of the study is good. Left ventricular cavity is noted to be normal on the rest and stress studies. Gated SPECT images reveal normal myocardial thickening and wall motion. The left ventricular ejection fraction was calculated 45%, although visually appears normal. SPECT images reveal small area of mild intensity, reversible perfusion defect in apical inferolateral myocardium, that may represent ischemia. 3. Intermediate risk study due to small perfusion defect and mildly reduced LVEF. Recommend correlation with echocardiogram. Raynaud's disease without gangrene (I73.00)  Palpitations (R00.2)  Event monitor 7 days. 07/13/2018: Unscheduled transmission 07/14/2018 at 10:50 AM: 6 beat NSVT. No symptoms reported. EKG 07/20/2018: Sinus tachycardia at the rate of 1 9 bpm, left axis deviation, poor R-wave progression, cannot exclude anterolateral infarct old. Nonspecific T abnormality. Low-voltage complexes. EKG 11/08/2012 NSR @ 87/min. Normal axis. IRBBB. Otherwise normal. Exertional shortness of breath (R06.02)  Echocardiogram 07/23/2018: Normal LV systolic function, grade 1 diastolic dysfunction, mildly calcified aortic leaflets, no significant valvular abnormality. Laboratory examination (Z01.89)  06/16/2018: Cholesterol 236, triglycerides 65, HDL 71, LDL 152. Connective tissue disorder panel performed 07/08/2018: Negative for lupus anticoagulant, negative ANA, negative anti-double stranded DNA, negative for scleroderma. 08/19/2018: CBC normal. NSVT (nonsustained ventricular tachycardia) (I47.2)  7 day event monitor 07/13/2018-07/19/2018: Dominant rhythm: Normal sinus rhythm. 11 patient activated events occurred with symptoms of chest pain, shortness of breath, rapid heart rate, correlating with sinus tachycardia. Fastest tachycardia episode was day one  at 1400 with 133 beats per without reported symptoms. When autodetect event of sinus rhythm with 6 beats of  NSVT on day 2 10:51 AM with symptoms of chest pain, shortness of breath. No A. fib or SVT was noted.  Allergies (Tonya Boyd; 07/29/2018 1:24 PM) NSAIDs  Hypertension Reglan *GASTROINTESTINAL AGENTS - MISC.*  Neurological symptoms Tylox *ANALGESICS - OPIOID*  Hives. Diovan *ANTIHYPERTENSIVES*  tingling Keflex *CEPHALOSPORINS*  confusion Metoclopramide HCl *GASTROINTESTINAL AGENTS - MISC.*  hives Morphine Sulfate *ANALGESICS - OPIOID*  hyper Norvasc *CALCIUM CHANNEL BLOCKERS*  numbness oxyCODONE HCl *ANALGESICS - OPIOID*  hives Singulair *ANTIASTHMATIC AND BRONCHODILATOR AGENTS*  Diarrhea.  Family History Tonya Boyd(Tonya Boyd; 07/29/2018 1:24 PM) Mother  Deceased. at age 60-Hypertension; Diabetes; h/o kidney transplant Father  Deceased. at age 83-h/o MI/CABG in his 5550's. Brother 1  living-no known heart conditions.  Social History Tonya Boyd(Tonya Boyd; 07/29/2018 1:24 PM) Current tobacco use  Never smoker. Non Drinker/No Alcohol Use  Marital status  Married. Number of Children  3.  Past Surgical History Tonya Boyd(Tonya Boyd; 07/29/2018 1:24 PM) Left ankle surgery  over 20 years ago. Arthroscopic Knee Surgery - Left [2013]: Shoulder Surgery  both over 15 years ago. Cholecystectomy  about 15 years ago. Appendectomy  over 15 years ago. Hysterectomy (not due to cancer) - Partial [1989]:  Medication History (Tonya Boyd; 07/29/2018 1:34 PM) Losartan Potassium (25MG  Tablet, 1 tablet Tablet Oral daily, Taken starting 07/26/2018) Active. (this replaces cartia) Triazolam (0.25MG  Tablet, 1 Oral at bedtime) Active. Vitamin D injection (once a month) Active. Cyclobenzaprine HCl (10MG  Tablet, 1 Oral daily) Active. Oxybutynin Chloride (5MG  Tablet, 1/2 Oral daily) Active. Dulera (100-5MCG/ACT Aerosol, 2 puffs Inhalation daily) Active. Cartia XT (180MG  Capsule ER  24HR, 1 capsule Oral daily) Active. Medications Reconciled (verbally)  Diagnostic Studies History Tonya Boyd(Tonya Boyd; 07/29/2018 1:25 PM) Nuclear stress test  Lexiscan Sestamibi stress test 07/22/2018: 1. Lexiscan stress test was performed. Exercise capacity was not assessed. Stress symptoms included chest pain, dyspnea, nausea, dizziness. Peak blood pressure was 148/98 mmHg. The resting and stress electrocardiogram demonstrated normal sinus rhythm, normal resting conduction, no resting arrhythmias and normal rest repolarization. Stress EKG is non diagnostic for ischemia as it is a pharmacologic stress. 2. The overall quality of the study is good. Left ventricular cavity is noted to be normal on the rest and stress studies. Gated SPECT images reveal normal myocardial thickening and wall motion. The left ventricular ejection fraction was calculated 45%, although visually appears normal. SPECT images reveal small area of mild intensity, reversible perfusion defect in apical inferolateral myocardium, that may represent ischemia. 3. Intermediate risk study due to small perfusion defect and mildly reduced LVEF. Recommend correlation with echocardiogram.    Review of Systems Tonya Boyd(Tonya Boyd; 07/29/2018 2:09 PM) General Present- Fatigue. Not Present- Feeling well, Tiredness and Unable to Sleep Lying Flat. 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 HEENT Not Present- Blurred Vision. Respiratory Present- Difficulty Breathing on Exertion. Not Present- Bloody sputum and Wakes up from Sleep Wheezing or Short of Breath. Cardiovascular Present- Orthopnea and Palpitations. Not Present- Edema, Leg Cramps and Paroxysmal Nocturnal Dyspnea. Gastrointestinal Present- Heartburn (mild). Not Present- Black, Tarry Stool and Bloody Stool. Musculoskeletal Not Present- Claudication and Joint Pain. Neurological Not Present- Focal Neurological Symptoms. Psychiatric Not Present- Personality Changes  and Suicidal Ideation. Hematology Not Present- Blood Clots, Easy Bruising and Nose Bleed.  Vitals (Tonya Boyd; 07/29/2018 1:35 PM) 07/29/2018 1:26 PM Weight: 179.44 lb Height: 64.5in Body Surface Area: 1.88 m Body Mass Index: 30.32 kg/m  Pulse: 102 (Regular)  P.OX: 98% (Room air) BP: 142/87 (Sitting, Left Arm, Standard)  Physical Exam (Tonya H.  Nicholaus Bloom, Boyd; 07/29/2018 2:11 PM) General Mental Status-Alert. General Appearance-Cooperative and Appears stated age. Build & Nutrition-Moderately built.  Head and Neck Thyroid Gland Characteristics - normal size and consistency and no palpable nodules.  Chest and Lung Exam Chest and lung exam reveals -quiet, even and easy respiratory effort with no use of accessory muscles, non-tender and on auscultation, normal breath sounds, no adventitious sounds.  Cardiovascular Cardiovascular examination reveals -carotid auscultation reveals no bruits, abdominal aorta auscultation reveals no bruits and no prominent pulsation, femoral artery auscultation bilaterally reveals normal pulses, no bruits, no thrills, normal pedal pulses bilaterally and no digital clubbing, cyanosis, edema, increased warmth or tenderness. Auscultation Rhythm - Tachycardic.  Abdomen Palpation/Percussion Normal exam - Non Tender and No hepatosplenomegaly.  Neurologic Neurologic evaluation reveals -alert and oriented x 3 with no impairment of recent or remote memory. Motor-Grossly intact without any focal deficits.  Musculoskeletal Global Assessment Left Lower Extremity - no deformities, masses or tenderness, no known fractures. Right Lower Extremity - no deformities, masses or tenderness, no known fractures.   Results Tonya Boyd; 07/29/2018 2:16 PM)  Myocardial perfusion imaging, tomographic (SPECT) (including attenuation correction, qualitative or quantitative wall motion, ejection fraction by first pass or gated technique,  additional quantification, when performed); multiple studies, Comments: Lexiscan Sestamibi stress test 07/22/2018: 1. Lexiscan stress test was performed. Exercise capacity was not assessed. Stress symptoms included chest pain, dyspnea, nausea, dizziness. Peak blood pressure was 148/98 mmHg. The resting and stress electrocardiogram demonstrated normal sinus rhythm, normal resting conduction, no resting arrhythmias and normal rest repolarization. Stress EKG is non diagnostic for ischemia as it is a pharmacologic stress. 2. The overall quality of the study is good. Left ventricular cavity is noted to be normal on the rest and stress studies. Gated SPECT images reveal normal myocardial thickening and wall motion. The left ventricular ejection fraction was calculated 45%, although visually appears normal. SPECT images reveal small area of mild intensity, reversible perfusion defect in apical inferolateral myocardium, that may represent ischemia. 3. Intermediate risk study due to small perfusion defect and mildly reduced LVEF. Recommend correlation with echocardiogram.  Performed: 07/22/2018 2:50 PM  Assessment & Plan Tonya Boyd; 07/29/2018 2:12 PM) Palpitations (R00.2) Story: Event monitor 7 days. 07/13/2018: Unscheduled transmission 07/14/2018 at 10:50 AM: 6 beat NSVT. No symptoms reported.  EKG 07/20/2018: Sinus tachycardia at the rate of 1 9 bpm, left axis deviation, poor R-wave progression, cannot exclude anterolateral infarct old. Nonspecific T abnormality. Low-voltage complexes.  EKG 11/08/2012 NSR @ 87/min. Normal axis. IRBBB. Otherwise normal. Current Plans Discontinued Cartia XT 180MG  (continued tachycardia). NSVT (nonsustained ventricular tachycardia) (I47.2) Story: 7 day event monitor 07/13/2018-07/19/2018: Dominant rhythm: Normal sinus rhythm. 11 patient activated events occurred with symptoms of chest pain, shortness of breath, rapid heart rate, correlating with sinus  tachycardia. Fastest tachycardia episode was day one at 1400 with 133 beats per without reported symptoms. When autodetect event of sinus rhythm with 6 beats of NSVT on day 2 10:51 AM with symptoms of chest pain, shortness of breath. No A. fib or SVT was noted. Current Plans Started Verapamil HCl ER 120MG , 1 (one) Capsule daily, #30, 30 days starting 07/29/2018, Ref. x1. Local Order: d/c cartia Abnormal nuclear stress test (R94.39) Chest pain, atypical (R07.89) Story: EKG 07/20/2018: Sinus tachycardia at the rate of 109 bpm, left axis deviation, poor R-wave progression, cannot exclude anterolateral infarct old. Nonspecific T abnormality. Low-voltage complexes.  EKG 11/08/2012 NSR @ 87/min. Normal axis. IRBBB. Otherwise normal.  Lexiscan Sestamibi stress  test 07/22/2018: 1. Lexiscan stress test was performed. Exercise capacity was not assessed. Stress symptoms included chest pain, dyspnea, nausea, dizziness. Peak blood pressure was 148/98 mmHg. The resting and stress electrocardiogram demonstrated normal sinus rhythm, normal resting conduction, no resting arrhythmias and normal rest repolarization. Stress EKG is non diagnostic for ischemia as it is a pharmacologic stress. 2. The overall quality of the study is good. Left ventricular cavity is noted to be normal on the rest and stress studies. Gated SPECT images reveal normal myocardial thickening and wall motion. The left ventricular ejection fraction was calculated 45%, although visually appears normal. SPECT images reveal small area of mild intensity, reversible perfusion defect in apical inferolateral myocardium, that may represent ischemia. 3. Intermediate risk study due to small perfusion defect and mildly reduced LVEF. Recommend correlation with echocardiogram. Exertional shortness of breath (R06.02) Story: Echocardiogram 07/23/2018: Normal LV systolic function, grade 1 diastolic dysfunction, mildly calcified aortic leaflets, no significant  valvular abnormality. Raynaud's disease without gangrene (I73.00) Laboratory examination (Z61.09) Story: 06/16/2018: Cholesterol 236, triglycerides 65, HDL 71, LDL 152.  Connective tissue disorder panel performed 07/08/2018: Negative for lupus anticoagulant, negative ANA, negative anti-double stranded DNA, negative for scleroderma.  08/19/2018: CBC normal.  Note:-  Recommendations:  Tonya Boyd is a pleasant Caucasian female with history of raynaud's disease and GERD last seen by [place] in 2014 for chest pain that was concerning for coronary spasm. Underwent GXT and echocardiogram that was normal.  Patient was evaluated by pulmonary medicine, was found to be tachycardic, her chest discomfort was felt to be probably due to GERD related and also dyspnea was felt to be due to GERD, was started on PPI and referred for event monitor. Event for 7 days had revealed asymptomatic NSVT.  I discussed recently obtained stress test results with the patient and her husband, has intermediate risk nuclear stress test with perfusion deficit in inferior wall. In view of her symptoms of chest pain, shortness of breath, and NSVT, feel as though the patient is high risk. We'll schedule for coronary angiogram for further evaluation. Schedule for cardiac catheterization, and possible angioplasty. We discussed regarding risks, benefits, alternatives to this including stress testing, CTA and continued medical therapy. Patient wants to proceed. Understands <1-2% risk of death, stroke, MI, urgent CABG, bleeding, infection, renal failure but not limited to these.  She did not have improvement in tachycardia with Cartia XT. I'll discontinue this and will try verapamil 120 mg daily. She will continue with losartan 25 mg daily for blood pressure control. I have encouraged her to start daily aspirin at least until she can have procedure performed. Would like to avoid beta blockers in view of severe Raynaud's disease.  I'll see her back after the procedure for follow-up.   Addendum: Due to connective tissue disorder and dyspnea, will also schedule and add right heart cath as well.   CC: Evangeline Dakin, PA-C; CC Dr. Kalman Shan. (Pul)    Signed by Tonya Bouchard, Boyd (07/29/2018 2:17 PM)

## 2018-08-02 ENCOUNTER — Other Ambulatory Visit: Payer: Self-pay

## 2018-08-02 ENCOUNTER — Encounter (HOSPITAL_COMMUNITY): Payer: Self-pay | Admitting: General Practice

## 2018-08-02 ENCOUNTER — Encounter (HOSPITAL_COMMUNITY)
Admission: RE | Disposition: A | Discharge status: Home / Self Care | Payer: Self-pay | Source: Ambulatory Visit | Attending: Cardiology

## 2018-08-02 ENCOUNTER — Ambulatory Visit (HOSPITAL_COMMUNITY)
Admission: RE | Admit: 2018-08-02 | Discharge: 2018-08-03 | Disposition: A | Discharge status: Home / Self Care | Payer: BLUE CROSS/BLUE SHIELD | Source: Ambulatory Visit | Attending: Cardiology | Admitting: Cardiology

## 2018-08-02 DIAGNOSIS — I7300 Raynaud's syndrome without gangrene: Secondary | ICD-10-CM | POA: Diagnosis not present

## 2018-08-02 DIAGNOSIS — Z9861 Coronary angioplasty status: Secondary | ICD-10-CM | POA: Diagnosis present

## 2018-08-02 DIAGNOSIS — I10 Essential (primary) hypertension: Secondary | ICD-10-CM | POA: Insufficient documentation

## 2018-08-02 DIAGNOSIS — I472 Ventricular tachycardia: Secondary | ICD-10-CM | POA: Diagnosis not present

## 2018-08-02 DIAGNOSIS — Z7902 Long term (current) use of antithrombotics/antiplatelets: Secondary | ICD-10-CM | POA: Insufficient documentation

## 2018-08-02 DIAGNOSIS — I2584 Coronary atherosclerosis due to calcified coronary lesion: Secondary | ICD-10-CM | POA: Insufficient documentation

## 2018-08-02 DIAGNOSIS — Z7982 Long term (current) use of aspirin: Secondary | ICD-10-CM | POA: Insufficient documentation

## 2018-08-02 DIAGNOSIS — Z888 Allergy status to other drugs, medicaments and biological substances status: Secondary | ICD-10-CM | POA: Insufficient documentation

## 2018-08-02 DIAGNOSIS — I2510 Atherosclerotic heart disease of native coronary artery without angina pectoris: Secondary | ICD-10-CM | POA: Diagnosis not present

## 2018-08-02 DIAGNOSIS — Z885 Allergy status to narcotic agent status: Secondary | ICD-10-CM | POA: Diagnosis not present

## 2018-08-02 DIAGNOSIS — R0609 Other forms of dyspnea: Secondary | ICD-10-CM | POA: Insufficient documentation

## 2018-08-02 DIAGNOSIS — Z955 Presence of coronary angioplasty implant and graft: Secondary | ICD-10-CM

## 2018-08-02 DIAGNOSIS — K219 Gastro-esophageal reflux disease without esophagitis: Secondary | ICD-10-CM | POA: Diagnosis not present

## 2018-08-02 DIAGNOSIS — I4729 Other ventricular tachycardia: Secondary | ICD-10-CM

## 2018-08-02 HISTORY — DX: Personal history of other diseases of the digestive system: Z87.19

## 2018-08-02 HISTORY — DX: Personal history of urinary calculi: Z87.442

## 2018-08-02 HISTORY — DX: Atherosclerotic heart disease of native coronary artery without angina pectoris: I25.10

## 2018-08-02 HISTORY — PX: CORONARY STENT INTERVENTION: CATH118234

## 2018-08-02 HISTORY — DX: Unspecified chronic bronchitis: J42

## 2018-08-02 HISTORY — DX: Raynaud's syndrome without gangrene: I73.00

## 2018-08-02 HISTORY — DX: Gastro-esophageal reflux disease without esophagitis: K21.9

## 2018-08-02 HISTORY — DX: Personal history of other diseases of the musculoskeletal system and connective tissue: Z87.39

## 2018-08-02 HISTORY — DX: Family history of other specified conditions: Z84.89

## 2018-08-02 HISTORY — PX: RIGHT/LEFT HEART CATH AND CORONARY ANGIOGRAPHY: CATH118266

## 2018-08-02 HISTORY — DX: Essential (primary) hypertension: I10

## 2018-08-02 HISTORY — DX: Pure hypercholesterolemia, unspecified: E78.00

## 2018-08-02 HISTORY — DX: Interstitial cystitis (chronic) without hematuria: N30.10

## 2018-08-02 HISTORY — PX: CORONARY ANGIOPLASTY WITH STENT PLACEMENT: SHX49

## 2018-08-02 HISTORY — DX: Ventricular tachycardia: I47.2

## 2018-08-02 LAB — POCT I-STAT 3, ART BLOOD GAS (G3+)
Bicarbonate: 25.4 mmol/L (ref 20.0–28.0)
O2 Saturation: 99 %
TCO2: 27 mmol/L (ref 22–32)
pCO2 arterial: 43.2 mmHg (ref 32.0–48.0)
pH, Arterial: 7.377 (ref 7.350–7.450)
pO2, Arterial: 134 mmHg — ABNORMAL HIGH (ref 83.0–108.0)

## 2018-08-02 LAB — POCT I-STAT 3, VENOUS BLOOD GAS (G3P V)
Bicarbonate: 25.9 mmol/L (ref 20.0–28.0)
O2 Saturation: 81 %
TCO2: 27 mmol/L (ref 22–32)
pCO2, Ven: 46.7 mmHg (ref 44.0–60.0)
pH, Ven: 7.351 (ref 7.250–7.430)
pO2, Ven: 48 mmHg — ABNORMAL HIGH (ref 32.0–45.0)

## 2018-08-02 LAB — POCT ACTIVATED CLOTTING TIME: Activated Clotting Time: 301 seconds

## 2018-08-02 SURGERY — RIGHT/LEFT HEART CATH AND CORONARY ANGIOGRAPHY
Anesthesia: LOCAL

## 2018-08-02 MED ORDER — NITROGLYCERIN 0.4 MG SL SUBL: 0.4000 mg | SUBLINGUAL_TABLET | SUBLINGUAL | Status: DC | PRN

## 2018-08-02 MED ORDER — HEPARIN SODIUM (PORCINE) 1000 UNIT/ML IJ SOLN
INTRAMUSCULAR | Status: AC
  Filled 2018-08-02: qty 1

## 2018-08-02 MED ORDER — VERAPAMIL HCL ER 120 MG PO TBCR
120.0000 mg | EXTENDED_RELEASE_TABLET | Freq: Every day | ORAL | Status: DC
  Administered 2018-08-02: 120 mg via ORAL
  Filled 2018-08-02: qty 1

## 2018-08-02 MED ORDER — VERAPAMIL HCL 2.5 MG/ML IV SOLN
INTRAVENOUS | Status: AC
  Filled 2018-08-02: qty 2

## 2018-08-02 MED ORDER — FENTANYL CITRATE (PF) 100 MCG/2ML IJ SOLN
INTRAMUSCULAR | Status: AC
  Filled 2018-08-02: qty 2

## 2018-08-02 MED ORDER — ASPIRIN EC 81 MG PO TBEC
81.0000 mg | DELAYED_RELEASE_TABLET | Freq: Every day | ORAL | Status: DC
  Administered 2018-08-03: 81 mg via ORAL
  Filled 2018-08-02: qty 1

## 2018-08-02 MED ORDER — HEPARIN (PORCINE) IN NACL 1000-0.9 UT/500ML-% IV SOLN
INTRAVENOUS | Status: DC | PRN
  Administered 2018-08-02: 500 mL

## 2018-08-02 MED ORDER — IOHEXOL 350 MG/ML SOLN
INTRAVENOUS | Status: DC | PRN
  Administered 2018-08-02: 100 mL via INTRA_ARTERIAL

## 2018-08-02 MED ORDER — TRIAZOLAM 0.125 MG PO TABS: 0.1250 mg | ORAL_TABLET | Freq: Every day | ORAL | Status: DC

## 2018-08-02 MED ORDER — LABETALOL HCL 5 MG/ML IV SOLN
INTRAVENOUS | Status: DC | PRN
  Administered 2018-08-02: 10 mg via INTRAVENOUS

## 2018-08-02 MED ORDER — LABETALOL HCL 5 MG/ML IV SOLN
INTRAVENOUS | Status: AC
  Filled 2018-08-02: qty 4

## 2018-08-02 MED ORDER — FAMOTIDINE IN NACL 20-0.9 MG/50ML-% IV SOLN
INTRAVENOUS | Status: AC
  Filled 2018-08-02: qty 50

## 2018-08-02 MED ORDER — FAMOTIDINE IN NACL 20-0.9 MG/50ML-% IV SOLN
INTRAVENOUS | Status: AC | PRN
  Administered 2018-08-02: 20 mg via INTRAVENOUS

## 2018-08-02 MED ORDER — MIDAZOLAM HCL 2 MG/2ML IJ SOLN
INTRAMUSCULAR | Status: AC
  Filled 2018-08-02: qty 2

## 2018-08-02 MED ORDER — SODIUM CHLORIDE 0.9 % WEIGHT BASED INFUSION
1.0000 mL/kg/h | INTRAVENOUS | Status: AC
  Administered 2018-08-02: 14:00:00 1 mL/kg/h via INTRAVENOUS

## 2018-08-02 MED ORDER — SODIUM CHLORIDE 0.9% FLUSH: 3.0000 mL | Freq: Two times a day (BID) | INTRAVENOUS | Status: DC

## 2018-08-02 MED ORDER — OXYBUTYNIN CHLORIDE 5 MG PO TABS
2.5000 mg | ORAL_TABLET | Freq: Every day | ORAL | Status: DC
  Administered 2018-08-02: 2.5 mg via ORAL
  Filled 2018-08-02: qty 0.5

## 2018-08-02 MED ORDER — HYDRALAZINE HCL 20 MG/ML IJ SOLN: 5.0000 mg | INTRAMUSCULAR | Status: AC | PRN

## 2018-08-02 MED ORDER — NITROGLYCERIN 0.4 MG SL SUBL
SUBLINGUAL_TABLET | SUBLINGUAL | Status: AC
  Filled 2018-08-02: qty 1

## 2018-08-02 MED ORDER — SODIUM CHLORIDE 0.9% FLUSH: 3.0000 mL | INTRAVENOUS | Status: DC | PRN

## 2018-08-02 MED ORDER — MIDAZOLAM HCL 2 MG/2ML IJ SOLN
INTRAMUSCULAR | Status: DC | PRN
  Administered 2018-08-02 (×3): 1 mg via INTRAVENOUS
  Administered 2018-08-02: 2 mg via INTRAVENOUS

## 2018-08-02 MED ORDER — LIDOCAINE HCL (PF) 1 % IJ SOLN
INTRAMUSCULAR | Status: AC
  Filled 2018-08-02: qty 30

## 2018-08-02 MED ORDER — SODIUM CHLORIDE 0.9 % IV SOLN: 250.0000 mL | INTRAVENOUS | Status: DC | PRN

## 2018-08-02 MED ORDER — HEPARIN SODIUM (PORCINE) 1000 UNIT/ML IJ SOLN
INTRAMUSCULAR | Status: DC | PRN
  Administered 2018-08-02: 3000 [IU] via INTRAVENOUS
  Administered 2018-08-02: 4000 [IU] via INTRAVENOUS
  Administered 2018-08-02: 5000 [IU] via INTRAVENOUS

## 2018-08-02 MED ORDER — CYCLOBENZAPRINE HCL 10 MG PO TABS
10.0000 mg | ORAL_TABLET | Freq: Every day | ORAL | Status: DC
  Administered 2018-08-02: 22:00:00 10 mg via ORAL
  Filled 2018-08-02: qty 1

## 2018-08-02 MED ORDER — SODIUM CHLORIDE 0.9 % WEIGHT BASED INFUSION: 3.0000 mL/kg/h | INTRAVENOUS | Status: DC

## 2018-08-02 MED ORDER — ASPIRIN 81 MG PO CHEW
CHEWABLE_TABLET | ORAL | Status: AC
  Filled 2018-08-02: qty 1

## 2018-08-02 MED ORDER — LABETALOL HCL 5 MG/ML IV SOLN: 10.0000 mg | INTRAVENOUS | Status: AC | PRN

## 2018-08-02 MED ORDER — LIDOCAINE HCL (PF) 1 % IJ SOLN
INTRAMUSCULAR | Status: DC | PRN
  Administered 2018-08-02: 3 mL
  Administered 2018-08-02: 2 mL

## 2018-08-02 MED ORDER — THE SENSUOUS HEART BOOK
Freq: Once | Status: AC
  Administered 2018-08-02: 22:00:00
  Filled 2018-08-02: qty 1

## 2018-08-02 MED ORDER — ONDANSETRON HCL 4 MG/2ML IJ SOLN: 4.0000 mg | Freq: Four times a day (QID) | INTRAMUSCULAR | Status: DC | PRN

## 2018-08-02 MED ORDER — ANGIOPLASTY BOOK
Freq: Once | Status: AC
  Administered 2018-08-02: 22:00:00
  Filled 2018-08-02: qty 1

## 2018-08-02 MED ORDER — HEPARIN (PORCINE) IN NACL 1000-0.9 UT/500ML-% IV SOLN
INTRAVENOUS | Status: AC
  Filled 2018-08-02: qty 1000

## 2018-08-02 MED ORDER — NITROGLYCERIN 1 MG/10 ML FOR IR/CATH LAB
INTRA_ARTERIAL | Status: AC
  Filled 2018-08-02: qty 10

## 2018-08-02 MED ORDER — CLOPIDOGREL BISULFATE 75 MG PO TABS
75.0000 mg | ORAL_TABLET | Freq: Every day | ORAL | Status: DC
  Administered 2018-08-03: 75 mg via ORAL
  Filled 2018-08-02: qty 1

## 2018-08-02 MED ORDER — CLOPIDOGREL BISULFATE 300 MG PO TABS
ORAL_TABLET | ORAL | Status: AC
  Filled 2018-08-02: qty 2

## 2018-08-02 MED ORDER — PANTOPRAZOLE SODIUM 40 MG PO TBEC
40.0000 mg | DELAYED_RELEASE_TABLET | Freq: Every day | ORAL | Status: DC
  Administered 2018-08-02 – 2018-08-03 (×2): 40 mg via ORAL
  Filled 2018-08-02 (×2): qty 1

## 2018-08-02 MED ORDER — ALUM & MAG HYDROXIDE-SIMETH 200-200-20 MG/5ML PO SUSP
30.0000 mL | Freq: Once | ORAL | Status: AC
  Administered 2018-08-02: 30 mL via ORAL
  Filled 2018-08-02: qty 30

## 2018-08-02 MED ORDER — LOSARTAN POTASSIUM 50 MG PO TABS
25.0000 mg | ORAL_TABLET | Freq: Every day | ORAL | Status: DC
  Administered 2018-08-03: 10:00:00 25 mg via ORAL
  Filled 2018-08-02: qty 1

## 2018-08-02 MED ORDER — SODIUM CHLORIDE 0.9% FLUSH
3.0000 mL | Freq: Two times a day (BID) | INTRAVENOUS | Status: DC
  Administered 2018-08-02 (×2): 3 mL via INTRAVENOUS

## 2018-08-02 MED ORDER — SODIUM CHLORIDE 0.9 % WEIGHT BASED INFUSION: 1.0000 mL/kg/h | INTRAVENOUS | Status: DC

## 2018-08-02 MED ORDER — ISOSORBIDE MONONITRATE ER 60 MG PO TB24
60.0000 mg | ORAL_TABLET | Freq: Every day | ORAL | Status: DC
  Administered 2018-08-02 – 2018-08-03 (×2): 60 mg via ORAL
  Filled 2018-08-02 (×2): qty 1

## 2018-08-02 MED ORDER — ZOLPIDEM TARTRATE 5 MG PO TABS
5.0000 mg | ORAL_TABLET | Freq: Once | ORAL | Status: AC
  Administered 2018-08-02: 5 mg via ORAL
  Filled 2018-08-02: qty 1

## 2018-08-02 MED ORDER — VERAPAMIL HCL 2.5 MG/ML IV SOLN
INTRA_ARTERIAL | Status: DC | PRN
  Administered 2018-08-02: 10 mL via INTRA_ARTERIAL

## 2018-08-02 MED ORDER — NITROGLYCERIN 1 MG/10 ML FOR IR/CATH LAB
INTRA_ARTERIAL | Status: DC | PRN
  Administered 2018-08-02: 200 ug via INTRACORONARY

## 2018-08-02 MED ORDER — CLOPIDOGREL BISULFATE 300 MG PO TABS
ORAL_TABLET | ORAL | Status: DC | PRN
  Administered 2018-08-02: 600 mg via ORAL

## 2018-08-02 MED ORDER — ASPIRIN 81 MG PO CHEW
81.0000 mg | CHEWABLE_TABLET | Freq: Once | ORAL | Status: AC
  Administered 2018-08-02: 81 mg via ORAL

## 2018-08-02 MED ORDER — FENTANYL CITRATE (PF) 100 MCG/2ML IJ SOLN
INTRAMUSCULAR | Status: DC | PRN
  Administered 2018-08-02 (×2): 25 ug via INTRAVENOUS

## 2018-08-02 SURGICAL SUPPLY — 18 items
BALLN SAPPHIRE 3.0X20 (BALLOONS) ×2
BALLOON SAPPHIRE 3.0X20 (BALLOONS) ×1 IMPLANT
CATH BALLN WEDGE 5F 110CM (CATHETERS) ×2 IMPLANT
CATH LAUNCHER 6FR JR4 (CATHETERS) ×2 IMPLANT
CATH OPTITORQUE TIG 4.0 5F (CATHETERS) ×2 IMPLANT
DEVICE RAD COMP TR BAND LRG (VASCULAR PRODUCTS) ×2 IMPLANT
GLIDESHEATH SLEND A-KIT 6F 20G (SHEATH) ×2 IMPLANT
GUIDEWIRE INQWIRE 1.5J.035X260 (WIRE) ×1 IMPLANT
INQWIRE 1.5J .035X260CM (WIRE) ×2
KIT ENCORE 26 ADVANTAGE (KITS) ×2 IMPLANT
KIT HEART LEFT (KITS) ×2 IMPLANT
PACK CARDIAC CATHETERIZATION (CUSTOM PROCEDURE TRAY) ×2 IMPLANT
SHEATH GLIDE SLENDER 4/5FR (SHEATH) ×2 IMPLANT
SHEATH PROBE COVER 6X72 (BAG) ×2 IMPLANT
STENT ORSIRO 4.0X30 (Permanent Stent) ×2 IMPLANT
TRANSDUCER W/STOPCOCK (MISCELLANEOUS) ×2 IMPLANT
TUBING CIL FLEX 10 FLL-RA (TUBING) ×2 IMPLANT
WIRE SION BLUE 180 (WIRE) ×2 IMPLANT

## 2018-08-02 NOTE — Interval H&P Note (Signed)
History and Physical Interval Note:  08/02/2018 9:24 AM  Gilford RilePatricia A Boyd  has presented today for surgery, with the diagnosis of abnormal stress test, NSVT  The various methods of treatment have been discussed with the patient and family. After consideration of risks, benefits and other options for treatment, the patient has consented to  Procedure(s): RIGHT/LEFT HEART CATH AND CORONARY ANGIOGRAPHY (N/A) and possible angioplasty as a surgical intervention .  The patient's history has been reviewed, patient examined, no change in status, stable for surgery.  I have reviewed the patient's chart and labs.  Questions were answered to the patient's satisfaction.   Symptom Status: Ischemic Symptoms Non-invasive Testing: High risk If no or indeterminate stress test, FFR/iFR results in all diseased vessels: N/A Diabetes Mellitus: No S/P CABG: No Antianginal therapy (number of long-acting drugs): 1 Patient undergoing renal transplant: No Patient undergoing percutaneous valve procedure: No   1 Vessel Disease No proximal LAD involvement, No proximal left dominant LCX involvement  PCI: M (6);  Indication 2  CABG: M (4);  Indication 2 Proximal left dominant LCX involvement  PCI: A (7);  Indication 5  CABG: A (7);  Indication 5 Proximal LAD involvement  PCI: A (7);  Indication 5  CABG: A (7);  Indication 5  2 Vessel Disease No proximal LAD involvement  PCI: A (7);  Indication 8  CABG: M (6);  Indication 8 Proximal LAD involvement  PCI: A (7);  Indication 11  CABG: A (7);  Indication 11  3 Vessel Disease Low disease complexity (e.g., focal stenoses, SYNTAX <=22)  PCI: A (7);  Indication 17  CABG: A (8);  Indication 17 Intermediate or high disease complexity (e.g., SYNTAX >=23)  PCI: M (6);  Indication 21  CABG: A (8);  Indication 21  Left Main Disease Isolated LMCA disease: ostial or midshaft  PCI: A (7);  Indication 24  CABG: A (9);  Indication 24 Isolated LMCA disease: bifurcation  involvement  PCI: M (5);  Indication 25  CABG: A (9);  Indication 25 LMCA ostial or midshaft, concurrent low disease burden multivessel disease (e.g., 1-2 additional focal stenoses, SYNTAX <=22)  PCI: A (7);  Indication 26  CABG: A (9);  Indication 26 LMCA ostial or midshaft, concurrent intermediate or high disease burden multivessel disease (e.g., 1-2 additional bifurcation stenoses, long stenoses, SYNTAX >=23)  PCI: M (4);  Indication 27  CABG: A (9);  Indication 27 LMCA bifurcation involvement, concurrent low disease burden multivessel disease (e.g., 1-2 additional focal stenoses, SYNTAX <=22)  PCI: M (5);  Indication 28  CABG: A (9);  Indication 28 LMCA bifurcation involvement, concurrent intermediate or high disease burden multivessel disease (e.g., 1-2 additional bifurcation stenoses, long stenoses, SYNTAX >=23)  PCI: R (3);  Indication 29  CABG: A (9);  Indication 29  Notes:  A indicates appropriate. M indicates may be appropriate. R indicates rarely appropriate. Number in parentheses is median score for that indication. Reclassify indicates number of functionally diseased vessels should be decreased given negative FFR/iFR. Re-evaluate the scenario interpreting any FFR/iFR negative vessel as being not significantly stenosed.  Disease means involved vessel provides flow to a sufficient amount of myocardium to be clinically important.  If FFR testing indicates a vessel is not significant, that vessel should not be considered diseased (and the patient should be reclassified with respect to extent of functionally significant disease).  Proximal LAD + proximal left dominant LCX is considered 3 vessel CAD  2 Vessel CAD with FFR/iFR abnormal in only 1  but not both is considered 1 vessel CAD  Disease complexity includes occlusion, bifurcation, trifurcation, ostial, >20 mm, tortuosity, calcification, thrombus  LMCA disease is >=50% by angiography, MLD <2.8 mm, MLA <6 mm2; MLA 6-7.5 mm2 requires  further physiologic  See Table B for risk stratification based on noninvasive testing  Journal of the [employer] of Cardiology Mar 2017, 23391; DOI: 10.1016/j.jacc.2017.02.001 [email or web address].full-[email or web address] This App  2018 by the Society for Cardiovascular Angiography and Interventions    [name]

## 2018-08-02 NOTE — Progress Notes (Signed)
TRBBAND REMOVAL  LOCATION:    right radial  DEFLATED PER PROTOCOL:    Yes.    TIME BAND OFF / DRESSING APPLIED:    1600   SITE UPON ARRIVAL:    Level 0  SITE AFTER BAND REMOVAL:    Level 1(BRUISE)  CIRCULATION SENSATION AND MOVEMENT:    Within Normal Limits   Yes.    COMMENTS:   Tolerated procedure well

## 2018-08-02 NOTE — Progress Notes (Signed)
Patient c/o 8/10  mid lower chest pain while having dinner. Ntg 1 tab SL given ,EKG done, Dr Jacinto HalimGanji notified ,with new orders, maalox 30cc given , with relief. V/S stable , monitored patient , endorsed to incoming RN

## 2018-08-03 DIAGNOSIS — I10 Essential (primary) hypertension: Secondary | ICD-10-CM

## 2018-08-03 DIAGNOSIS — I472 Ventricular tachycardia: Secondary | ICD-10-CM | POA: Diagnosis not present

## 2018-08-03 DIAGNOSIS — I2510 Atherosclerotic heart disease of native coronary artery without angina pectoris: Secondary | ICD-10-CM

## 2018-08-03 DIAGNOSIS — I2584 Coronary atherosclerosis due to calcified coronary lesion: Secondary | ICD-10-CM | POA: Diagnosis not present

## 2018-08-03 HISTORY — DX: Atherosclerotic heart disease of native coronary artery without angina pectoris: I25.10

## 2018-08-03 LAB — CBC
HCT: 36.5 % (ref 36.0–46.0)
Hemoglobin: 11.8 g/dL — ABNORMAL LOW (ref 12.0–15.0)
MCH: 31.7 pg (ref 26.0–34.0)
MCHC: 32.3 g/dL (ref 30.0–36.0)
MCV: 98.1 fL (ref 80.0–100.0)
Platelets: 219 10*3/uL (ref 150–400)
RBC: 3.72 MIL/uL — ABNORMAL LOW (ref 3.87–5.11)
RDW: 12.4 % (ref 11.5–15.5)
WBC: 11.4 10*3/uL — ABNORMAL HIGH (ref 4.0–10.5)
nRBC: 0 % (ref 0.0–0.2)

## 2018-08-03 LAB — BASIC METABOLIC PANEL
Anion gap: 6 (ref 5–15)
BUN: 16 mg/dL (ref 8–23)
CO2: 25 mmol/L (ref 22–32)
Calcium: 8.7 mg/dL — ABNORMAL LOW (ref 8.9–10.3)
Chloride: 107 mmol/L (ref 98–111)
Creatinine, Ser: 0.91 mg/dL (ref 0.44–1.00)
Glucose, Bld: 104 mg/dL — ABNORMAL HIGH (ref 70–99)
Potassium: 3.9 mmol/L (ref 3.5–5.1)
Sodium: 138 mmol/L (ref 135–145)

## 2018-08-03 MED ORDER — NITROGLYCERIN 0.4 MG SL SUBL: 0.4000 mg | SUBLINGUAL_TABLET | SUBLINGUAL | 2 refills | Status: DC | PRN

## 2018-08-03 MED ORDER — ROSUVASTATIN CALCIUM 20 MG PO TABS: 10.0000 mg | ORAL_TABLET | Freq: Every day | ORAL | 3 refills | Status: DC

## 2018-08-03 MED ORDER — NITROGLYCERIN 0.4 MG SL SUBL: 0.4000 mg | SUBLINGUAL_TABLET | SUBLINGUAL | 1 refills | Status: DC | PRN

## 2018-08-03 MED ORDER — CLOPIDOGREL BISULFATE 75 MG PO TABS: 75.0000 mg | ORAL_TABLET | Freq: Every day | ORAL | 2 refills | Status: DC

## 2018-08-03 MED ORDER — ROSUVASTATIN CALCIUM 20 MG PO TABS: 20.0000 mg | ORAL_TABLET | Freq: Every day | ORAL | 3 refills | Status: DC

## 2018-08-03 MED ORDER — CLOPIDOGREL BISULFATE 75 MG PO TABS: 75.0000 mg | ORAL_TABLET | Freq: Every day | ORAL | 1 refills | Status: DC

## 2018-08-03 MED ORDER — ACETAMINOPHEN 325 MG PO TABS
650.0000 mg | ORAL_TABLET | Freq: Four times a day (QID) | ORAL | Status: DC | PRN
  Administered 2018-08-03: 650 mg via ORAL
  Filled 2018-08-03: qty 2

## 2018-08-03 MED ORDER — PANTOPRAZOLE SODIUM 20 MG PO TBEC: 20.0000 mg | DELAYED_RELEASE_TABLET | Freq: Every day | ORAL | 1 refills | Status: DC

## 2018-08-03 MED FILL — ROSUVASTATIN CALCIUM 20 MG: 20 | 30 days supply | Qty: 30 | Fill #0

## 2018-08-03 MED FILL — PANTOPRAZOLE SOD DR 20 MG T: 20 | 30 days supply | Qty: 30 | Fill #0

## 2018-08-03 MED FILL — CLOPIDOGREL 75 MG TABLET: 75 | 90 days supply | Qty: 90 | Fill #0

## 2018-08-03 MED FILL — NITROGLYCERIN 0.4 MG TAB SL: 0.4 | 7 days supply | Qty: 25 | Fill #0

## 2018-08-03 NOTE — Progress Notes (Signed)
CARDIAC REHAB PHASE I   PRE:  Rate/Rhythm: 89 SR  BP:  Sitting: 119/63        SaO2: 97 RA  MODE:  Ambulation: 300 ft   POST:  Rate/Rhythm: 100 SR  BP:  Sitting: 111/66        SaO2: 96 RA  0750 - 0857  Pt ambulated independently 300 ft. Pt stopped for 1 standing rest break due to SOB. Gait was slow and steady. Upon returning to the room pt c/o SOB and laid down. Stent ed complete along with review of stent card. Ed complete on NTG usage, importance of plavix and ASA, and restrictions. Ed complete on exercise guidelines and nutrition (HH). Pt voices understanding. Pt referred to CRPII at [facility].  Alisia FerrariDalton S Caydon Feasel, MS 08/03/2018 8:49 AM

## 2018-08-03 NOTE — Discharge Summary (Addendum)
Physician Discharge Summary  Patient ID: Tonya Boyd MRN: [id number] DOB/AGE: 61/09/1955 62 y.o.  Admit date: 08/02/2018 Discharge date: 08/03/2018  Admission Diagnoses:  Discharge Diagnoses:  Principal Problem:   NSVT (nonsustained ventricular tachycardia) (HCC) Active Problems:   Post PTCA   Essential hypertension   Coronary artery disease   Discharged Condition: stable  Hospital Course:   62 year old Caucasian female with hypertension, exertional dyspnea, nonsustained ventricular tachycardia, high risk stress test with ischemia and reduced LVEF, underwent outpatient coronary angiography that showed severe mid RCA disease for which she underwent PCI as below.  Consults: None  Significant Diagnostic Studies: labs:   Results for Tonya Boyd (MRN [id number]) as of 08/03/2018 09:35  Ref. Range 08/03/2018 07:32  WBC Latest Ref Range: 4.0 - 10.5 K/uL 11.4 (H)  RBC Latest Ref Range: 3.87 - 5.11 MIL/uL 3.72 (L)  Hemoglobin Latest Ref Range: 12.0 - 15.0 g/dL 62.911.8 (L)  HCT Latest Ref Range: 36.0 - 46.0 % 36.5  MCV Latest Ref Range: 80.0 - 100.0 fL 98.1  MCH Latest Ref Range: 26.0 - 34.0 pg 31.7  MCHC Latest Ref Range: 30.0 - 36.0 g/dL 52.832.3  RDW Latest Ref Range: 11.5 - 15.5 % 12.4  Platelets Latest Ref Range: 150 - 400 K/uL 219  nRBC Latest Ref Range: 0.0 - 0.2 % 0.0   Results for Tonya Boyd (MRN [id number]) as of 08/03/2018 09:35  Ref. Range 08/03/2018 07:32  BASIC METABOLIC PANEL Unknown Rpt (Boyd)  Sodium Latest Ref Range: 135 - 145 mmol/L 138  Potassium Latest Ref Range: 3.5 - 5.1 mmol/L 3.9  Chloride Latest Ref Range: 98 - 111 mmol/L 107  CO2 Latest Ref Range: 22 - 32 mmol/L 25  Glucose Latest Ref Range: 70 - 99 mg/dL 272104 (H)  BUN Latest Ref Range: 8 - 23 mg/dL 16  Creatinine Latest Ref Range: 0.44 - 1.00 mg/dL 5.360.91  Calcium Latest Ref Range: 8.9 - 10.3 mg/dL 8.7 (L)  Anion gap Latest Ref Range: 5 - 15  6  GFR, Est Non African American Latest Ref Range:  >60 mL/min NOT CALCULATED  GFR, Est African American Latest Ref Range: >60 mL/min NOT CALCULATED    Treatments:  R&LHC 08/02/2018:  The left ventricular systolic function is normal. LV end diastolic pressure is normal.  Superdominant large RCA. Prox RCA to Mid RCA lesion is 80% stenosed S/P STENT ORSIRO DES 4.0X30 with 0% residual stenosis  Ost RPDA lesion is 50% stenosed.  Small LAD ends before reaching apex. Moderate sized ostial 1st Diag lesion is 80% stenosed.  Normal right heart catheterization.   Medical therapy for diagonal disease.  I expect significant improvement in her symptoms of both dyspnea, chest pain.  100 mL contrast utilized.    Recommend uninterrupted dual antiplatelet therapy with Aspirin 81mg  daily and Clopidogrel 75mg  daily for Boyd minimum of 6 months (stable ischemic heart disease - Class I recommendation).  Discharge Exam: Blood pressure (!) 101/58, pulse 83, temperature 97.8 F (36.6 C), temperature source Oral, resp. rate 13, height 5' 4.5" (1.638 m), weight 76.8 kg, SpO2 98 %. Physical Exam  Constitutional: She is oriented to person, place, and time. She appears well-developed and well-nourished. No distress.  Neck: No JVD present.  Cardiovascular: Normal rate, regular rhythm, normal heart sounds and intact distal pulses.  No murmur heard. Right wrist ecchymosis without discernible hematoma. Intact pulses.  Pulmonary/Chest: Effort normal and breath sounds normal. She has no wheezes. She has no rales.  Abdominal: Soft. Bowel sounds are  normal.  Musculoskeletal: She exhibits no edema.  Lymphadenopathy:    She has no cervical adenopathy.  Neurological: She is alert and oriented to person, place, and time. No cranial nerve deficit.  Skin: Skin is warm and dry.  Psychiatric: She has Boyd normal mood and affect.     Disposition: Discharge disposition: 01-Home or Self Care       Discharge Instructions    Amb Referral to Cardiac Rehabilitation    Complete by:  As directed    Diagnosis:   PTCA Coronary Stents     Diet - low sodium heart healthy   Complete by:  As directed    Diet - low sodium heart healthy   Complete by:  As directed    Diet - low sodium heart healthy   Complete by:  As directed    Increase activity slowly   Complete by:  As directed    Increase activity slowly   Complete by:  As directed    Increase activity slowly   Complete by:  As directed      Allergies as of 08/03/2018      Reactions   Diovan [valsartan]    Tingling    Keflex [cephalexin]    confusion   Metoclopramide Hcl    Morphine And Related    hyper   Norvasc [amlodipine Besylate]    Numbness and tingling    Nsaids    Oxycodone-acetaminophen    Prednisone    Elevated BP   Reglan [metoclopramide]    confusion   Singulair [montelukast]    diarrhea      Medication List    STOP taking these medications   omeprazole 20 MG capsule Commonly known as:  PRILOSEC     TAKE these medications   aspirin EC 81 MG tablet Take 81 mg by mouth daily.   clopidogrel 75 MG tablet Commonly known as:  PLAVIX Take 1 tablet (75 mg total) by mouth daily with breakfast.   cyclobenzaprine 10 MG tablet Commonly known as:  FLEXERIL Take 10 mg by mouth at bedtime.   losartan 25 MG tablet Commonly known as:  COZAAR Take 25 mg by mouth daily.   nitroGLYCERIN 0.4 MG SL tablet Commonly known as:  NITROSTAT Place 1 tablet (0.4 mg total) under the tongue every 5 (five) minutes as needed for chest pain.   oxybutynin 5 MG tablet Commonly known as:  DITROPAN Take 2.5 mg by mouth at bedtime.   pantoprazole 20 MG tablet Commonly known as:  PROTONIX Take 1 tablet (20 mg total) by mouth daily.   rosuvastatin 20 MG tablet Commonly known as:  CRESTOR Take 1 tablet (20 mg total) by mouth at bedtime.   triazolam 0.125 MG tablet Commonly known as:  HALCION Take 0.125 mg by mouth at bedtime.   verapamil 120 MG 24 hr capsule Commonly known as:   VERELAN PM Take 120 mg by mouth at bedtime.      Follow-up Information    [name], MD On 08/11/2018.   Specialty:  Cardiology Why:  Keep previous appointment Contact information: [address] [place] [address] [phone]           Signed: Elder Negus 08/03/2018, 9:34 AM   Elder Negus, MD [facility] [place] Pager: [phone] Office: [phone] If no answer Cell [phone]

## 2018-08-08 ENCOUNTER — Telehealth: Payer: Self-pay | Admitting: Internal Medicine

## 2018-08-08 NOTE — Telephone Encounter (Signed)
Done. Some ggo due to motion artifact. Endede up she had CAD and s/p strent. She is fine now  Thanks for your help  Reply not needed  Will close note     SIGNATURE    Dr. Kalman ShanMurali Roseline Ebarb, M.D., F.C.C.P,  Pulmonary and Critical Care Medicine Staff Physician, [facility] Center Director - Interstitial Lung Disease  Program  Pulmonary Fibrosis [facility] [place], [place], [id number]  Pager: [phone], If no answer or between  15:00h - 7:00h: call [phone] Telephone: [phone]  5:45 PM 08/08/2018

## 2018-08-08 NOTE — Telephone Encounter (Signed)
Routing to myself to follow up on in the morning.

## 2018-08-08 NOTE — Telephone Encounter (Signed)
Patient wanted to call and thank Dr. Marchelle Gearingamaswamy for ordering echo. She states because of this she got an event monitor through her employer [employer]  She states she was found to have an 80% blockage just before Thanksgiving.  She wanted Dr. Marchelle Gearingamaswamy to know that because of him, this was found and she is recovering.

## 2018-08-08 NOTE — Telephone Encounter (Signed)
Forwarding to MR as FYI.  

## 2018-08-08 NOTE — Telephone Encounter (Signed)
Please appreciate her for the compliment. Please let her know I am humbled.   Also, please let her know that HRCT is generally clear. There is some patch ground glass opacities that our radiologist thinks is due to motion.  At this stage will follow along  Ensure followup in a few months     SIGNATURE    Dr. Kalman ShanMurali Tonya Boyd, M.D., F.C.C.P,  Pulmonary and Critical Care Medicine Staff Physician, [facility] Center Director - Interstitial Lung Disease  Program  Pulmonary Fibrosis [facility] - [facility] [place], [place], [address]  Pager: [phone], If no answer or between  15:00h - 7:00h: call [phone] Telephone: [phone]  5:39 PM 08/08/2018

## 2018-08-10 NOTE — Telephone Encounter (Signed)
Advised pt of results. Pt understood and nothing further is needed.   

## 2018-08-10 NOTE — Telephone Encounter (Signed)
Attempted to call pt but unable to reach her. Left message for pt to return call. 

## 2018-08-10 NOTE — Telephone Encounter (Signed)
Patient returned phone call; pt contact [phone]

## 2018-08-19 ENCOUNTER — Ambulatory Visit: Payer: BLUE CROSS/BLUE SHIELD

## 2018-08-24 ENCOUNTER — Encounter (HOSPITAL_COMMUNITY): Payer: Self-pay | Admitting: *Deleted

## 2018-09-30 ENCOUNTER — Ambulatory Visit
Admission: RE | Admit: 2018-09-30 | Discharge: 2018-09-30 | Disposition: A | Discharge status: Home / Self Care | Payer: Self-pay | Source: Ambulatory Visit | Attending: Family Medicine | Admitting: Family Medicine

## 2018-09-30 DIAGNOSIS — Z1231 Encounter for screening mammogram for malignant neoplasm of breast: Secondary | ICD-10-CM

## 2018-10-07 ENCOUNTER — Ambulatory Visit (HOSPITAL_COMMUNITY)
Admission: RE | Admit: 2018-10-07 | Discharge: 2018-10-07 | Disposition: A | Discharge status: Home / Self Care | Payer: BLUE CROSS/BLUE SHIELD | Attending: Cardiology | Admitting: Cardiology

## 2018-10-07 ENCOUNTER — Encounter (HOSPITAL_COMMUNITY): Payer: Self-pay | Admitting: *Deleted

## 2018-10-07 ENCOUNTER — Encounter (HOSPITAL_COMMUNITY)
Admission: RE | Disposition: A | Discharge status: Home / Self Care | Payer: Self-pay | Source: Home / Self Care | Attending: Cardiology

## 2018-10-07 ENCOUNTER — Other Ambulatory Visit: Payer: Self-pay

## 2018-10-07 DIAGNOSIS — Z955 Presence of coronary angioplasty implant and graft: Secondary | ICD-10-CM | POA: Insufficient documentation

## 2018-10-07 DIAGNOSIS — R0602 Shortness of breath: Secondary | ICD-10-CM | POA: Insufficient documentation

## 2018-10-07 DIAGNOSIS — I472 Ventricular tachycardia: Secondary | ICD-10-CM | POA: Insufficient documentation

## 2018-10-07 DIAGNOSIS — Z885 Allergy status to narcotic agent status: Secondary | ICD-10-CM | POA: Insufficient documentation

## 2018-10-07 DIAGNOSIS — Z886 Allergy status to analgesic agent status: Secondary | ICD-10-CM | POA: Diagnosis not present

## 2018-10-07 DIAGNOSIS — I2584 Coronary atherosclerosis due to calcified coronary lesion: Secondary | ICD-10-CM | POA: Insufficient documentation

## 2018-10-07 DIAGNOSIS — I25118 Atherosclerotic heart disease of native coronary artery with other forms of angina pectoris: Secondary | ICD-10-CM | POA: Diagnosis not present

## 2018-10-07 DIAGNOSIS — M791 Myalgia, unspecified site: Secondary | ICD-10-CM | POA: Insufficient documentation

## 2018-10-07 DIAGNOSIS — Z8249 Family history of ischemic heart disease and other diseases of the circulatory system: Secondary | ICD-10-CM | POA: Diagnosis not present

## 2018-10-07 DIAGNOSIS — Z7982 Long term (current) use of aspirin: Secondary | ICD-10-CM | POA: Insufficient documentation

## 2018-10-07 DIAGNOSIS — Z881 Allergy status to other antibiotic agents status: Secondary | ICD-10-CM | POA: Insufficient documentation

## 2018-10-07 DIAGNOSIS — Z79899 Other long term (current) drug therapy: Secondary | ICD-10-CM | POA: Diagnosis not present

## 2018-10-07 DIAGNOSIS — K219 Gastro-esophageal reflux disease without esophagitis: Secondary | ICD-10-CM | POA: Diagnosis not present

## 2018-10-07 DIAGNOSIS — I209 Angina pectoris, unspecified: Secondary | ICD-10-CM | POA: Diagnosis present

## 2018-10-07 DIAGNOSIS — I7300 Raynaud's syndrome without gangrene: Secondary | ICD-10-CM | POA: Diagnosis not present

## 2018-10-07 DIAGNOSIS — Z888 Allergy status to other drugs, medicaments and biological substances status: Secondary | ICD-10-CM | POA: Diagnosis not present

## 2018-10-07 DIAGNOSIS — Z90711 Acquired absence of uterus with remaining cervical stump: Secondary | ICD-10-CM | POA: Diagnosis not present

## 2018-10-07 HISTORY — PX: RIGHT HEART CATH: CATH118263

## 2018-10-07 HISTORY — PX: LEFT HEART CATH AND CORONARY ANGIOGRAPHY: CATH118249

## 2018-10-07 HISTORY — PX: INTRAVASCULAR PRESSURE WIRE/FFR STUDY: CATH118243

## 2018-10-07 LAB — BASIC METABOLIC PANEL
Anion gap: 12 (ref 5–15)
BUN: 19 mg/dL (ref 8–23)
CO2: 20 mmol/L — ABNORMAL LOW (ref 22–32)
Calcium: 9.3 mg/dL (ref 8.9–10.3)
Chloride: 108 mmol/L (ref 98–111)
Creatinine, Ser: 0.92 mg/dL (ref 0.44–1.00)
GFR calc Af Amer: >60 mL/min (ref >60)
GFR calc non Af Amer: >60 mL/min (ref >60)
Glucose, Bld: 86 mg/dL (ref 70–99)
Potassium: 4.1 mmol/L (ref 3.5–5.1)
Sodium: 140 mmol/L (ref 135–145)

## 2018-10-07 LAB — CBC
HCT: 39.6 % (ref 36.0–46.0)
Hemoglobin: 12.5 g/dL (ref 12.0–15.0)
MCH: 31.9 pg (ref 26.0–34.0)
MCHC: 31.6 g/dL (ref 30.0–36.0)
MCV: 101 fL — ABNORMAL HIGH (ref 80.0–100.0)
Platelets: 232 10*3/uL (ref 150–400)
RBC: 3.92 MIL/uL (ref 3.87–5.11)
RDW: 12.6 % (ref 11.5–15.5)
WBC: 6.1 10*3/uL (ref 4.0–10.5)
nRBC: 0 % (ref 0.0–0.2)

## 2018-10-07 LAB — POCT ACTIVATED CLOTTING TIME: Activated Clotting Time: 290 seconds

## 2018-10-07 SURGERY — LEFT HEART CATH AND CORONARY ANGIOGRAPHY
Anesthesia: LOCAL

## 2018-10-07 MED ORDER — RANOLAZINE ER 1000 MG PO TB12: 1000.0000 mg | ORAL_TABLET | Freq: Two times a day (BID) | ORAL | 2 refills | Status: DC

## 2018-10-07 MED ORDER — ONDANSETRON HCL 4 MG/2ML IJ SOLN: 4.0000 mg | Freq: Four times a day (QID) | INTRAMUSCULAR | Status: DC | PRN

## 2018-10-07 MED ORDER — MIDAZOLAM HCL 2 MG/2ML IJ SOLN
INTRAMUSCULAR | Status: AC
  Filled 2018-10-07: qty 2

## 2018-10-07 MED ORDER — SODIUM CHLORIDE 0.9 % WEIGHT BASED INFUSION: 1.0000 mL/kg/h | INTRAVENOUS | Status: DC

## 2018-10-07 MED ORDER — VERAPAMIL HCL 2.5 MG/ML IV SOLN
INTRAVENOUS | Status: AC
  Filled 2018-10-07: qty 2

## 2018-10-07 MED ORDER — SODIUM CHLORIDE 0.9 % WEIGHT BASED INFUSION
3.0000 mL/kg/h | INTRAVENOUS | Status: DC
  Administered 2018-10-07: 3 mL/kg/h via INTRAVENOUS

## 2018-10-07 MED ORDER — LIDOCAINE HCL (PF) 1 % IJ SOLN
INTRAMUSCULAR | Status: AC
  Filled 2018-10-07: qty 30

## 2018-10-07 MED ORDER — ASPIRIN 81 MG PO CHEW: 81.0000 mg | CHEWABLE_TABLET | ORAL | Status: DC

## 2018-10-07 MED ORDER — ACETAMINOPHEN 325 MG PO TABS: 650.0000 mg | ORAL_TABLET | ORAL | Status: DC | PRN

## 2018-10-07 MED ORDER — HEPARIN SODIUM (PORCINE) 1000 UNIT/ML IJ SOLN
INTRAMUSCULAR | Status: AC
  Filled 2018-10-07: qty 1

## 2018-10-07 MED ORDER — SODIUM CHLORIDE 0.9% FLUSH: 3.0000 mL | INTRAVENOUS | Status: DC | PRN

## 2018-10-07 MED ORDER — LIDOCAINE HCL (PF) 1 % IJ SOLN
INTRAMUSCULAR | Status: DC | PRN
  Administered 2018-10-07: 2 mL

## 2018-10-07 MED ORDER — SODIUM CHLORIDE 0.9 % IV SOLN: 250.0000 mL | INTRAVENOUS | Status: DC | PRN

## 2018-10-07 MED ORDER — FENTANYL CITRATE (PF) 100 MCG/2ML IJ SOLN
INTRAMUSCULAR | Status: DC | PRN
  Administered 2018-10-07 (×3): 25 ug via INTRAVENOUS

## 2018-10-07 MED ORDER — SODIUM CHLORIDE 0.9% FLUSH: 3.0000 mL | Freq: Two times a day (BID) | INTRAVENOUS | Status: DC

## 2018-10-07 MED ORDER — HEPARIN (PORCINE) IN NACL 1000-0.9 UT/500ML-% IV SOLN
INTRAVENOUS | Status: DC | PRN
  Administered 2018-10-07 (×2): 500 mL

## 2018-10-07 MED ORDER — VERAPAMIL HCL 2.5 MG/ML IV SOLN
INTRAVENOUS | Status: DC | PRN
  Administered 2018-10-07: 10 mL via INTRA_ARTERIAL

## 2018-10-07 MED ORDER — IOHEXOL 350 MG/ML SOLN
INTRAVENOUS | Status: DC | PRN
  Administered 2018-10-07: 65 mL via INTRACARDIAC

## 2018-10-07 MED ORDER — HEPARIN (PORCINE) IN NACL 1000-0.9 UT/500ML-% IV SOLN
INTRAVENOUS | Status: AC
  Filled 2018-10-07: qty 1000

## 2018-10-07 MED ORDER — MIDAZOLAM HCL 2 MG/2ML IJ SOLN
INTRAMUSCULAR | Status: DC | PRN
  Administered 2018-10-07: 1 mg via INTRAVENOUS
  Administered 2018-10-07: 2 mg via INTRAVENOUS
  Administered 2018-10-07: 1 mg via INTRAVENOUS

## 2018-10-07 MED ORDER — FENTANYL CITRATE (PF) 100 MCG/2ML IJ SOLN
INTRAMUSCULAR | Status: AC
  Filled 2018-10-07: qty 2

## 2018-10-07 MED ORDER — HEPARIN SODIUM (PORCINE) 1000 UNIT/ML IJ SOLN
INTRAMUSCULAR | Status: DC | PRN
  Administered 2018-10-07: 8000 [IU] via INTRAVENOUS

## 2018-10-07 SURGICAL SUPPLY — 12 items
CATH OPTITORQUE TIG 4.0 5F (CATHETERS) ×2 IMPLANT
CATH VISTA GUIDE 6FR XB3 (CATHETERS) ×2 IMPLANT
DEVICE RAD COMP TR BAND LRG (VASCULAR PRODUCTS) ×2 IMPLANT
GLIDESHEATH SLEND A-KIT 6F 20G (SHEATH) ×2 IMPLANT
GUIDEWIRE INQWIRE 1.5J.035X260 (WIRE) ×1 IMPLANT
GUIDEWIRE PRESSURE COMET II (WIRE) ×2 IMPLANT
INQWIRE 1.5J .035X260CM (WIRE) ×2
KIT ESSENTIALS PG (KITS) ×2 IMPLANT
KIT HEART LEFT (KITS) ×2 IMPLANT
PACK CARDIAC CATHETERIZATION (CUSTOM PROCEDURE TRAY) ×2 IMPLANT
TRANSDUCER W/STOPCOCK (MISCELLANEOUS) ×2 IMPLANT
TUBING CIL FLEX 10 FLL-RA (TUBING) ×2 IMPLANT

## 2018-10-07 NOTE — Discharge Instructions (Signed)
Radial Site Care ° °This sheet gives you information about how to care for yourself after your procedure. Your health care provider may also give you more specific instructions. If you have problems or questions, contact your health care provider. °What can I expect after the procedure? °After the procedure, it is common to have: °· Bruising and tenderness at the catheter insertion area. °Follow these instructions at home: °Medicines °· Take over-the-counter and prescription medicines only as told by your health care provider. °Insertion site care °· Follow instructions from your health care provider about how to take care of your insertion site. Make sure you: °? Wash your hands with soap and water before you change your bandage (dressing). If soap and water are not available, use hand sanitizer. °? Change your dressing as told by your health care provider. °? Leave stitches (sutures), skin glue, or adhesive strips in place. These skin closures may need to stay in place for 2 weeks or longer. If adhesive strip edges start to loosen and curl up, you may trim the loose edges. Do not remove adhesive strips completely unless your health care provider tells you to do that. °· Check your insertion site every day for signs of infection. Check for: °? Redness, swelling, or pain. °? Fluid or blood. °? Pus or a bad smell. °? Warmth. °· Do not take baths, swim, or use a hot tub until your health care provider approves. °· You may shower 24-48 hours after the procedure, or as directed by your health care provider. °? Remove the dressing and gently wash the site with plain soap and water. °? Pat the area dry with a clean towel. °? Do not rub the site. That could cause bleeding. °· Do not apply powder or lotion to the site. °Activity ° °· For 24 hours after the procedure, or as directed by your health care provider: °? Do not flex or bend the affected arm. °? Do not push or pull heavy objects with the affected arm. °? Do not  drive yourself home from the hospital or clinic. You may drive 24 hours after the procedure unless your health care provider tells you not to. °? Do not operate machinery or power tools. °· Do not lift anything that is heavier than 10 lb (4.5 kg), or the limit that you are told, until your health care provider says that it is safe. °· Ask your health care provider when it is okay to: °? Return to work or school. °? Resume usual physical activities or sports. °? Resume sexual activity. °General instructions °· If the catheter site starts to bleed, raise your arm and put firm pressure on the site. If the bleeding does not stop, get help right away. This is a medical emergency. °· If you went home on the same day as your procedure, a responsible adult should be with you for the first 24 hours after you arrive home. °· Keep all follow-up visits as told by your health care provider. This is important. °Contact a health care provider if: °· You have a fever. °· You have redness, swelling, or yellow drainage around your insertion site. °Get help right away if: °· You have unusual pain at the radial site. °· The catheter insertion area swells very fast. °· The insertion area is bleeding, and the bleeding does not stop when you hold steady pressure on the area. °· Your arm or hand becomes pale, cool, tingly, or numb. °These symptoms may represent a serious problem   that is an emergency. Do not wait to see if the symptoms will go away. Get medical help right away. Call your local emergency services (911 in the [place] Do not drive yourself to the hospital. °Summary °· After the procedure, it is common to have bruising and tenderness at the site. °· Follow instructions from your health care provider about how to take care of your radial site wound. Check the wound every day for signs of infection. °· Do not lift anything that is heavier than 10 lb (4.5 kg), or the limit that you are told, until your health care provider says  that it is safe. °This information is not intended to replace advice given to you by your health care provider. Make sure you discuss any questions you have with your health care provider. °Document Released: 09/26/2010 Document Revised: 09/29/2017 Document Reviewed: 09/29/2017 °Elsevier Interactive Patient Education © 2019 [employer] ° °

## 2018-10-07 NOTE — H&P (Signed)
OFFICE VISIT NOTES COPIED TO EPIC FOR DOCUMENTATION  . History of Present Illness [name] MD; 10/03/2018 9:34 PM) Patient words: f/u SOB, last OV 09/23/18  pt c/o SOB, chest pain earlier, took nito.  The patient is a 63 year old female who presents for a Follow-up for Palpitations.  Additional reasons for visit:  Follow-up for Coronary artery disease is described as the following: [name] is a pleasant Caucasian female with history of raynaud's disease and GERD. Due to worsening dyspnea and also chest pain and abnormal nuclear stress test revealing inferior ischemia, event monitor showing NSVT, patient underwent coronary angiogram and was found to have a very large RCA dominant with a high-grade 80% stenosis for which she underwent angioplasty on 08/02/2018. Normal right heart catheterization.  She was seen by me 2 weeks ago for recurrent episodes of chest discomfort, but she had remained stable, hence had continued medical therapy. She again continues to have frequent episodes of angina pectoris and is used about 3-4 sublingual nitroglycerin since 3 days ago. She has also noticed worsening dyspnea and states it is different from her asthma.   Problem List/Past Medical [name], MD; 10/03/2018 2:57 PM) Chest pain, atypical (R07.89)  EKG 07/20/2018: Sinus tachycardia at the rate of 109 bpm, left axis deviation, poor R-wave progression, cannot exclude anterolateral infarct old. Nonspecific T abnormality. Low-voltage complexes.  EKG 11/08/2012 NSR @ 87/min. Normal axis. IRBBB. Otherwise normal.  Lexiscan Sestamibi stress test 07/22/2018: 1. Lexiscan stress test was performed. Exercise capacity was not assessed. Stress symptoms included chest pain, dyspnea, nausea, dizziness. Peak blood pressure was 148/98 mmHg. The resting and stress electrocardiogram demonstrated normal sinus rhythm, normal resting conduction, no resting arrhythmias and normal rest repolarization.  Stress EKG is non diagnostic for ischemia as it is a pharmacologic stress. 2. The overall quality of the study is good. Left ventricular cavity is noted to be normal on the rest and stress studies. Gated SPECT images reveal normal myocardial thickening and wall motion. The left ventricular ejection fraction was calculated 45%, although visually appears normal. SPECT images reveal small area of mild intensity, reversible perfusion defect in apical inferolateral myocardium, that may represent ischemia. 3. Intermediate risk study due to small perfusion defect and mildly reduced LVEF. Recommend correlation with echocardiogram. Raynaud's disease without gangrene (I73.00)  Exertional shortness of breath (R06.02)  Echocardiogram 07/23/2018: Normal LV systolic function, grade 1 diastolic dysfunction, mildly calcified aortic leaflets, no significant valvular abnormality. Laboratory examination (Z01.89)  Labs 07/29/2018: HB 14.9/HCT 44.0, platelets 275. Normal indicis. Serum glucose 94 mg, BUN 22, creatinine 0.93, eGFR 67 mL, potassium 4.4. 06/16/2018: Cholesterol 236, triglycerides 65, HDL 71, LDL 152. Connective tissue disorder panel performed 07/08/2018: Negative for lupus anticoagulant, negative ANA, negative anti-double stranded DNA, negative for scleroderma. 08/19/2018: CBC normal. NSVT (nonsustained ventricular tachycardia) (I47.2)  7 day event monitor 07/13/2018-07/19/2018: Dominant rhythm: Normal sinus rhythm. 11 patient activated events occurred with symptoms of chest pain, shortness of breath, rapid heart rate, correlating with sinus tachycardia. Fastest tachycardia episode was day one at 1400 with 133 beats per without reported symptoms. When autodetect event of sinus rhythm with 6 beats of NSVT on day 2 10:51 AM with symptoms of chest pain, shortness of breath. No A. fib or SVT was noted. 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 Nonspecific  T abnormality. Low-voltage complexes. Compared to EKG 07/20/2018, T wave inversion in leads V2 and V3 are more prominent.  Coronary angiogram 08/02/2018: Normal right heart catheterization. The left ventricular systolic function is normal. LV end diastolic pressure is normal. Superdominant large RCA. Prox RCA to Mid RCA lesion is 80% stenosed S/P STENT ORSIRO DES 4.0X30 with 0% residual stenosis. Ost RPDA lesion is 50% stenosed. Small LAD ends before reaching apex. Moderate sized ostial 1st Diag lesion is 80% stenosed.  EKG 08/12/2018: Normal sinus rhythm at rate of 92 bpm, normal axis. Poor R-wave progression, cannot exclude anteroseptal infarct old. Low voltage compresses otherwise no evidence of ischemia and normal QT interval. Myalgia due to statin (M79.10)  Acid reflux (K21.9)   Allergies (Tara Obenshine; 10/09/18 2:33 PM) NSAIDs  Hypertension Reglan *GASTROINTESTINAL AGENTS - MISC.*  Neurological symptoms Tylox *ANALGESICS - OPIOID*  Hives. Diovan *ANTIHYPERTENSIVES*  tingling Keflex *CEPHALOSPORINS*  confusion Metoclopramide HCl *GASTROINTESTINAL AGENTS - MISC.*  hives Morphine Sulfate *ANALGESICS - OPIOID*  hyper Norvasc *CALCIUM CHANNEL BLOCKERS*  numbness oxyCODONE HCl *ANALGESICS - OPIOID*  hives Singulair *ANTIASTHMATIC AND BRONCHODILATOR AGENTS*  Diarrhea.  Family History Saintclair Halsted; 10-09-2018 2:33 PM) Mother  Deceased. at age 63-Hypertension; Diabetes; h/o kidney transplant Father  Deceased. at age 26-h/o MI/CABG in his 65's. Brother 1  living-no known heart conditions.  Social History Baxter Flattery Obenshine; 2018/10/09 2:33 PM) Current tobacco use  Never smoker. Non Drinker/No Alcohol Use  Marital status  Married. Number of Children  3.  Past Surgical History Baxter Flattery Obenshine; 10-09-18 2:33 PM) Left ankle surgery   over 20 years ago. Arthroscopic Knee Surgery - Left [2013]: Shoulder Surgery  both over 15 years ago. Cholecystectomy  about 15 years ago. Appendectomy  over 15 years ago. Hysterectomy (not due to cancer) - Partial [1989]:  Medication History [name], MD; 10/09/18 9:47 PM) Pantoprazole Sodium (20MG Tablet DR, 1 Tablet Oral daily, Taken starting 09/29/2018) Active. Verapamil HCl ER (120MG Capsule ER 24HR, 1 (one) Capsule Oral daily, Taken starting 09/23/2018) Active. (d/c cartia) Atorvastatin Calcium (20MG Tablet, 1 (one) Tablet Oral daily in the evening at bedtime, Taken starting 09/23/2018) Active. (on hold until 10/07/18) Isosorbide Mononitrate ER (60MG Tablet ER 24HR, 1 (one) Tablet Oral daily in the morning, Taken starting 09/23/2018) Active. Labetalol HCl (100MG Tablet,  Tablet Tablet Oral two times daily, Taken starting 08/12/2018) Active. Triazolam (0.25MG Tablet, 1 Oral as needed) Active. Vitamin D injection (once a month) Active. Cyclobenzaprine HCl (10MG Tablet, 1 Oral daily) Active. Oxybutynin Chloride (5MG Tablet, 1/2 Oral daily) Active. Clopidogrel Bisulfate (75MG Tablet, 1 Oral daily) Active. Aspirin (81MG Capsule, 1 Oral daily) Active. Nitroglycerin (0.4MG Tab Sublingual, Sublingual as needed) Active. Medications Reconciled (meds present)    Review of Systems [name] MD; 10-09-18 9:34 PM) General Present- Fatigue. Not Present- Tiredness and Unable to Sleep Lying Flat. 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 HEENT Not Present- Blurred Vision. Respiratory Present- Difficulty Breathing on Exertion. Not Present- Bloody sputum and Wakes up from Sleep Wheezing or Short of Breath. Cardiovascular Present- Palpitations. Not Present- Edema, Leg Cramps, Orthopnea and Paroxysmal Nocturnal Dyspnea. Gastrointestinal Present- Heartburn (mild). Not Present- Black, Tarry Stool and Bloody Stool. Musculoskeletal Not Present-  Claudication and Joint Pain. Neurological Not Present- Focal Neurological Symptoms. Psychiatric Not Present- Personality Changes and Suicidal Ideation. Hematology Not Present- Blood Clots, Easy Bruising and Nose Bleed. All other systems negative  Vitals (Tara Obenshine; Oct 09, 2018 2:42 PM) 2018/10/09 2:37 PM Weight: 186.31 lb Height: 64.5in Body Surface Area:  1.91 m Body Mass Index: 31.49 kg/m  Pulse: 95 (Regular)  Resp.: 95 (Unlabored)  P.OX: 98% (Room air) BP: 177/81 (Sitting, Left Arm, Large)  Physical Exam [name], MD; 10/03/2018 9:47 PM) General Mental Status-Alert. General Appearance-Cooperative and Appears stated age. Build & Nutrition-Well nourished and Moderately built.  Head and Neck Thyroid Gland Characteristics - normal size and consistency and no palpable nodules.  Chest and Lung Exam Chest and lung exam reveals -quiet, even and easy respiratory effort with no use of accessory muscles, non-tender and on auscultation, normal breath sounds, no adventitious sounds.  Cardiovascular Cardiovascular examination reveals -carotid auscultation reveals no bruits, abdominal aorta auscultation reveals no bruits and no prominent pulsation, femoral artery auscultation bilaterally reveals normal pulses, no bruits, no thrills, normal pedal pulses bilaterally and no digital clubbing, cyanosis, edema, increased warmth or tenderness. Auscultation Rhythm - Regular.  Abdomen Palpation/Percussion Normal exam - Non Tender and No hepatosplenomegaly.  Neurologic Neurologic evaluation reveals -alert and oriented x 3 with no impairment of recent or remote memory. Motor-Grossly intact without any focal deficits.  Musculoskeletal Global Assessment Left Lower Extremity - no deformities, masses or tenderness, no known fractures. Right Lower Extremity - no deformities, masses or tenderness, no known fractures.  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 Diffuse nonspecific T abnormality. Borderline low voltage complexes. No significant change from EKG 09/23/2018  Coronary angiogram 08/02/2018: Normal right heart catheterization. The left ventricular systolic function is normal. LV end diastolic pressure is normal. Superdominant large RCA. Prox RCA to Mid RCA lesion is 80% stenosed S/P STENT ORSIRO DES 4.0X30 with 0% residual stenosis. Ost RPDA lesion is 50% stenosed. Small LAD ends before reaching apex. Moderate sized ostial 1st Diag lesion is 80% stenosed.  Exertional shortness of breath (R06.02) Story: Echocardiogram 07/23/2018: Normal LV systolic function, grade 1 diastolic dysfunction, mildly calcified aortic leaflets, no significant valvular abnormality. Laboratory examination (Z01.89) Story: 10/03/2018: Creatinine 0.9, EGFR 64/78, potassium 4.3, BMP normal.  RBC 3.9, CBC otherwise normal.  Labs 07/29/2018: HB 14.9/HCT 44.0, platelets 275. Normal indicis. Serum glucose 94 mg, BUN 22, creatinine 0.93, eGFR 67 mL, potassium 4.4.  06/16/2018: Cholesterol 236, triglycerides 65, HDL 71, LDL 152.  Connective tissue disorder panel performed 07/08/2018: Negative for lupus anticoagulant, negative ANA, negative anti-double stranded DNA, negative for scleroderma.  08/19/2018: CBC normal.   Recommendations:  Tonya Boyd is a pleasant Caucasian female with history of raynaud's disease and GERD. Due to worsening dyspnea and also chest pain and abnormal nuclear stress test revealing inferior ischemia, event monitor showing NSVT, patient underwent coronary angiogram and was found to have a very large RCA dominant with a high-grade 80% stenosis for which she underwent angioplasty on 08/02/2018.   She was seen by me 2  weeks ago for recurrent episodes of chest discomfort, but she had remained stable, hence had continued medical therapy. She again continues to have frequent episodes of angina pectoris and is used about 3-4 sublingual nitroglycerin since 3 days ago.  In view of continued symptoms, I recommended that we repeat cardiac catheterization. She is on maximal medical therapy. BP is elevated today, but probably she is is extremely anxious. NO change in medication made today. EKG does not reveal any new changes.  CC: Fredda Hammed, PA-C;    Signed by  [name], MD (10/03/2018 9:48 PM)

## 2018-10-07 NOTE — Interval H&P Note (Signed)
History and Physical Interval Note:  10/07/2018 3:01 PM  Tonya Boyd  has presented today for surgery, with the diagnosis of cp  The various methods of treatment have been discussed with the patient and family. After consideration of risks, benefits and other options for treatment, the patient has consented to  Procedure(s): LEFT HEART CATH AND CORONARY ANGIOGRAPHY (N/A) and possible angioplasty  as a surgical intervention .  The patient's history has been reviewed, patient examined, no change in status, stable for surgery.  I have reviewed the patient's chart and labs.  Questions were answered to the patient's satisfaction.   Symptom Status: Ischemic Symptoms Non-invasive Testing: Indeterminate If no or indeterminate stress test, FFR/iFR results in all diseased vessels: Not done Diabetes Mellitus: No S/P CABG: No Antianginal therapy (number of long-acting drugs): >=2 Patient undergoing renal transplant: No Patient undergoing percutaneous valve procedure: No   1 Vessel Disease No proximal LAD involvement, No proximal left dominant LCX involvement  PCI: Not rated  CABG: Not rated Proximal left dominant LCX involvement  PCI: Not rated  CABG: Not rated Proximal LAD involvement  PCI: Not rated  CABG: Not rated  2 Vessel Disease No proximal LAD involvement  PCI: Not rated  CABG: Not rated Proximal LAD involvement  PCI: Not rated  CABG: Not rated  3 Vessel Disease Low disease complexity (e.g., focal stenoses, SYNTAX <=22)  PCI: Not rated  CABG: Not rated Intermediate or high disease complexity (e.g., SYNTAX >=23)  PCI: Not rated  CABG: Not rated  Left Main Disease Isolated LMCA disease: ostial or midshaft  PCI: A (7);  Indication 24  CABG: A (9);  Indication 24 Isolated LMCA disease: bifurcation involvement  PCI: M (6);  Indication 25  CABG: A (9);  Indication 25 LMCA ostial or midshaft, concurrent low disease burden multivessel disease (e.g., 1-2 additional focal  stenoses, SYNTAX <=22)  PCI: A (7);  Indication 26  CABG: A (9);  Indication 26 LMCA ostial or midshaft, concurrent intermediate or high disease burden multivessel disease (e.g., 1-2 additional bifurcation stenoses, long stenoses, SYNTAX >=23)  PCI: M (4);  Indication 27  CABG: A (9);  Indication 27 LMCA bifurcation involvement, concurrent low disease burden multivessel disease (e.g., 1-2 additional focal stenoses, SYNTAX <=22)  PCI: M (6);  Indication 28  CABG: A (9);  Indication 28 LMCA bifurcation involvement, concurrent intermediate or high disease burden multivessel disease (e.g., 1-2 additional bifurcation stenoses, long stenoses, SYNTAX >=23)  PCI: R (3);  Indication 29  CABG: A (9);  Indication 29  Notes:  A indicates appropriate. M indicates may be appropriate. R indicates rarely appropriate. Number in parentheses is median score for that indication. Reclassify indicates number of functionally diseased vessels should be decreased given negative FFR/iFR. Re-evaluate the scenario interpreting any FFR/iFR negative vessel as being not significantly stenosed.  Disease means involved vessel provides flow to a sufficient amount of myocardium to be clinically important.  If FFR testing indicates a vessel is not significant, that vessel should not be considered diseased (and the patient should be reclassified with respect to extent of functionally significant disease).  Proximal LAD + proximal left dominant LCX is considered 3 vessel CAD  2 Vessel CAD with FFR/iFR abnormal in only 1 but not both is considered 1 vessel CAD  Disease complexity includes occlusion, bifurcation, trifurcation, ostial, >20 mm, tortuosity, calcification, thrombus  LMCA disease is >=50% by angiography, MLD <2.8 mm, MLA <6 mm2; MLA 6-7.5 mm2 requires further physiologic  See Table B  for risk stratification based on noninvasive testing  Journal of the [employer] of Cardiology Mar 2017, 23391; DOI:  10.1016/j.jacc.2017.02.001 [employer].de.[email or web address].full-text.pdf This App  2018 by the Society for Cardiovascular Angiography and Interventions   Adrian Prows

## 2018-10-10 ENCOUNTER — Telehealth: Payer: Self-pay

## 2018-10-10 ENCOUNTER — Encounter (HOSPITAL_COMMUNITY): Payer: Self-pay | Admitting: Cardiology

## 2018-10-10 NOTE — Telephone Encounter (Signed)
Patient wants to know the dose she is suppose to be taking, she says Tonya Boyd wanted her to take it a certain way for 3 days

## 2018-10-10 NOTE — Telephone Encounter (Signed)
Pt called wanting to know how does she take her ranexa; Also how long should she stay out of work

## 2018-10-11 NOTE — Telephone Encounter (Signed)
Spoke with pt and she is taking the ranexa 1000 two times daily and she feels better taking it that way; she will pick up her work nothe this coming Wednesday 10/13/2018. SJ

## 2018-10-11 NOTE — Telephone Encounter (Signed)
Take Ranexa 1000mg  BID. Can start back to work next week and work partial days

## 2018-10-15 ENCOUNTER — Encounter: Payer: Self-pay | Admitting: Cardiology

## 2018-10-15 DIAGNOSIS — I7300 Raynaud's syndrome without gangrene: Secondary | ICD-10-CM

## 2018-10-15 HISTORY — DX: Raynaud's syndrome without gangrene: I73.00

## 2018-10-15 NOTE — Progress Notes (Signed)
Subjective:   @Patient  ID: Tonya Boyd, female    DOB: 1956-08-10, 63 y.o.   MRN: [id number]  Chief Complaint  Patient presents with  . Follow-up    7-10 day cath f/u    HPI: Tonya Boyd  is a 63 y.o. female  with  history of raynaud's disease and GERD. Due to worsening dyspnea and also chest pain and abnormal nuclear stress test revealing inferior ischemia, event monitor showing NSVT, patient underwent coronary angiogram 08/02/18 and was found to have a very large RCA dominant with a high-grade 80% stenosis for which she underwent angioplasty on 08/02/2018. Normal right heart catheterization.  Due to recurrent episodes of chest pain which was nitrate responsive, she underwent repeat coronary angiography on 10/07/2018 revealing no significant change in coronary anatomy, diagonal 1 stenosis was not significant by GFR.  Chest pain was felt to be either noncardiac or microvascular disease, she was discharged home on Ranexa.  She now presents for follow-up.  Patient states that since being on Ranexa, symptoms of chest pain is improved.   Past Medical History:  Diagnosis Date  . Angina pectoris ([place])   . Asthma    "as a child and as an adult" (08/02/2018)  . CAD (coronary artery disease), native coronary artery 08/03/2018  . Chronic bronchitis ([place])   . Coronary artery disease   . Family history of adverse reaction to anesthesia    "cousin stopped breathing; he was allergic to the anesthesia" (08/02/2018)  . GERD (gastroesophageal reflux disease)   . High cholesterol   . History of gout   . History of hiatal hernia   . History of kidney stones   . Hypertension 07/2018  . Interstitial cystitis   . NSVT (nonsustained ventricular tachycardia) ([place]) 07/2018  . Raynaud's disease    "feet and hands" (08/02/2018)  . Raynaud's disease without gangrene 10/15/2018    Past Surgical History:  Procedure Laterality Date  . ABDOMINAL HYSTERECTOMY    . ANTERIOR CERVICAL  DECOMP/DISCECTOMY FUSION    . APPENDECTOMY    . AUGMENTATION MAMMAPLASTY Bilateral   . BACK SURGERY    . CORONARY ANGIOPLASTY WITH STENT PLACEMENT  08/02/2018  . CORONARY STENT INTERVENTION N/A 08/02/2018   Procedure: CORONARY STENT INTERVENTION;  Surgeon: Adrian Prows, MD;  Location: [facility];  Service: Cardiovascular;  Laterality: N/A;  RCA   . CYSTOSCOPY W/ STONE MANIPULATION    . INTRAVASCULAR PRESSURE WIRE/FFR STUDY N/A 10/07/2018   Procedure: INTRAVASCULAR PRESSURE WIRE/FFR STUDY;  Surgeon: Adrian Prows, MD;  Location: [facility];  Service: Cardiovascular;  Laterality: N/A;  . KNEE ARTHROSCOPY Right   . LAPAROSCOPIC CHOLECYSTECTOMY    . LEFT HEART CATH AND CORONARY ANGIOGRAPHY N/A 10/07/2018   Procedure: LEFT HEART CATH AND CORONARY ANGIOGRAPHY;  Surgeon: Adrian Prows, MD;  Location: [facility];  Service: Cardiovascular;  Laterality: N/A;  . REPAIR ANKLE LIGAMENT Left    "tied up ligament; had tore up qthing in my ankle"  . RIGHT HEART CATH  10/07/2018  . RIGHT/LEFT HEART CATH AND CORONARY ANGIOGRAPHY N/A 08/02/2018   Procedure: RIGHT/LEFT HEART CATH AND CORONARY ANGIOGRAPHY;  Surgeon: Adrian Prows, MD;  Location: [facility];  Service: Cardiovascular;  Laterality: N/A;  . SHOULDER SURGERY     bilateral  . TONSILLECTOMY    . TUBAL LIGATION      Social History   Socioeconomic History  . Marital status: Married    Spouse name: Not on file  .  Number of children: Not on file  . Years of education: GED  . Highest education level: Not on file  Occupational History    Employer: [employer]  Social Needs  . Financial resource strain: Not on file  . Food insecurity:    Worry: Not on file    Inability: Not on file  . Transportation needs:    Medical: Not on file    Non-medical: Not on file  Tobacco Use  . Smoking status: Never Smoker  . Smokeless tobacco: Never Used  Substance and Sexual Activity  . Alcohol use: Not Currently     Comment: 08/02/2018 "not since my 20's"  . Drug use: Never  . Sexual activity: Not on file  Lifestyle  . Physical activity:    Days per week: Not on file    Minutes per session: Not on file  . Stress: Not on file  Relationships  . Social connections:    Talks on phone: Not on file    Gets together: Not on file    Attends religious service: Not on file    Active member of club or organization: Not on file    Attends meetings of clubs or organizations: Not on file    Relationship status: Not on file  . Intimate partner violence:    Fear of current or ex partner: Not on file    Emotionally abused: Not on file    Physically abused: Not on file    Forced sexual activity: Not on file  Other Topics Concern  . Not on file  Social History Narrative  . Not on file    Current Outpatient Medications on File Prior to Visit  Medication Sig Dispense Refill  . aspirin EC 81 MG tablet Take 81 mg by mouth daily.    . clopidogrel (PLAVIX) 75 MG tablet Take 1 tablet (75 mg total) by mouth daily with breakfast. 90 tablet 1  . cyclobenzaprine (FLEXERIL) 10 MG tablet Take 10 mg by mouth at bedtime.    Marland Kitchen dexlansoprazole (DEXILANT) 60 MG capsule Take 60 mg by mouth daily.    . isosorbide mononitrate (IMDUR) 60 MG 24 hr tablet Take 60 mg by mouth daily.    Marland Kitchen labetalol (NORMODYNE) 100 MG tablet Take 50 mg by mouth 2 (two) times daily.    . nitroGLYCERIN (NITROSTAT) 0.4 MG SL tablet Place 1 tablet (0.4 mg total) under the tongue every 5 (five) minutes as needed for chest pain. 30 tablet 2  . oxybutynin (DITROPAN) 5 MG tablet Take 2.5 mg by mouth at bedtime.   1  . triazolam (HALCION) 0.125 MG tablet Take 0.125 mg by mouth at bedtime.    . verapamil (VERELAN PM) 120 MG 24 hr capsule Take 120 mg by mouth at bedtime.    . Vitamin D, Ergocalciferol, (DRISDOL) 1.25 MG (50000 UT) CAPS capsule Take 50,000 Units by mouth every 7 (seven) days. Tuesdays     No current facility-administered medications on file  prior to visit.      Review of Systems  Constitutional: Positive for malaise/fatigue (improved since being on Ranexa). Negative for weight loss.  Respiratory: Positive for shortness of breath (has h/o bronchial asthma). Negative for cough and hemoptysis.   Cardiovascular: Positive for palpitations. Negative for chest pain, claudication and leg swelling.  Gastrointestinal: Positive for heartburn. Negative for abdominal pain, blood in stool, constipation and vomiting.  Genitourinary: Negative for dysuria.  Musculoskeletal: Negative for joint pain and myalgias.  Neurological: Negative for dizziness,  focal weakness and headaches.  Endo/Heme/Allergies: Does not bruise/bleed easily.  Psychiatric/Behavioral: Negative for depression. The patient is not nervous/anxious.   All other systems reviewed and are negative.      Objective:  Blood pressure (!) 144/85, pulse 76, height 5' 4.5" (1.638 m), weight 181 lb (82.1 kg), SpO2 96 %.  Physical Exam  Constitutional: She appears well-developed and well-nourished. No distress.  HENT:  Head: Atraumatic.  Eyes: Conjunctivae are normal.  Neck: Neck supple. No JVD present. No thyromegaly present.  Cardiovascular: Normal rate, regular rhythm, normal heart sounds and intact distal pulses. Exam reveals no gallop.  No murmur heard. Pulmonary/Chest: Effort normal and breath sounds normal.  Abdominal: Soft. Bowel sounds are normal.  Musculoskeletal: Normal range of motion.        General: No edema.  Neurological: She is alert.  Skin: Skin is warm and dry.  Psychiatric: She has a normal mood and affect.    CARDIAC STUDIES:  Echocardiogram 07/23/2018:  Normal LV systolic function, grade 1 diastolic dysfunction, mildly calcified aortic leaflets, no significant valvular abnormality.  Coronary angiogram 10/07/2018:  Normal LV systolic function, normal LVEDP.  Mild to moderate coronary calcification involving left main, LAD and right coronary artery.   Mild disease in the LAD, large D1 with ostial 50 to 60% stenosis, appears to have improved from previous 70 to 80%.  DFR 0.96, not hemodynamically significant.  Circumflex is normal.  Right coronary artery is super dominant, mid RCA stent placed 08/02/2018 widely patent.  Very small less than 1 mm ramus intermediate with diffuse high-grade stenosis in the proximal segment.  L+R heart Cath 08/02/18: No pulmonary hypertension  Laboratory Exam: 10/03/2018: Creatinine 0.9, EGFR 64/78, potassium 4.3, BMP normal.  RBC 3.9, CBC otherwise normal.  Labs 07/29/2018: HB 14.9/HCT 44.0, platelets 275.  Normal indicis.  Serum glucose 94 mg, BUN 22, creatinine 0.93, eGFR 67 mL, potassium 4.4.  06/16/2018: Cholesterol 236, triglycerides 65, HDL 71, LDL 152.   Assessment & Recommendations:   1. Angina pectoris ([place])  2. CAD of the native artery with angina pectoris Coronary angiogram 10/07/2018: Normal LV systolic function, normal LVEDP.  Mild to moderate coronary calcification involving left main, LAD and right coronary artery.  Mild disease in the LAD, large D1 with ostial 50 to 60% stenosis, appears to have improved from previous 70 to 80%.  DFR 0.96, not hemodynamically significant.  Circumflex is normal.  Right coronary artery is super dominant, mid RCA stent placed 08/02/2018 widely patent.  Very small less than 1 mm ramus intermediate with diffuse high-grade stenosis in the proximal segment.  3. Dyspnea on exertion Due to combination of bronchial asthma and also underlying coronary artery disease.  Hypertension contributing.  4. Raynaud's disease without gangrene Although coronary angiogram reveals no significant disease progression, improvement in diagonal stenosis, I suspect she probably has variant angina pectoris in view of history of Raynaud's disease.  She is already on verapamil, continue the same.  5.  Hyperlipidemia Patient unable to tolerate Lipitor and also Crestor due to myalgias.  I'll  switch her to simvastatin 40 mg in the evening.  Recommendation: Symptoms of angina pectoris is improved on Ranexa, continue the same, will obtain CBC, CMP along with lipid profile testing in 3 months, and we'll see her back then. She can return to work part-time for a week and then return to full-time work.  I suspect she probably has variant form of angina pectoris along with chronic stable angina pectoris.  Patient's from the procedure.  Adrian Prows, MD, [facility] 10/17/2018, 12:18 PM [facility]. [place] Pager: [phone] Office: [phone] If no answer Cell [phone]

## 2018-10-17 ENCOUNTER — Ambulatory Visit (INDEPENDENT_AMBULATORY_CARE_PROVIDER_SITE_OTHER): Payer: BLUE CROSS/BLUE SHIELD | Admitting: Cardiology

## 2018-10-17 ENCOUNTER — Encounter: Payer: Self-pay | Admitting: Cardiology

## 2018-10-17 VITALS — BP 144/85 | HR 76 | Ht 64.5 in | Wt 181.0 lb

## 2018-10-17 DIAGNOSIS — I7300 Raynaud's syndrome without gangrene: Secondary | ICD-10-CM

## 2018-10-17 DIAGNOSIS — E7800 Pure hypercholesterolemia, unspecified: Secondary | ICD-10-CM | POA: Diagnosis not present

## 2018-10-17 DIAGNOSIS — R0609 Other forms of dyspnea: Secondary | ICD-10-CM

## 2018-10-17 DIAGNOSIS — R0600 Dyspnea, unspecified: Secondary | ICD-10-CM

## 2018-10-17 DIAGNOSIS — I209 Angina pectoris, unspecified: Secondary | ICD-10-CM

## 2018-10-17 MED ORDER — RANOLAZINE ER 1000 MG PO TB12: 1000.0000 mg | ORAL_TABLET | Freq: Two times a day (BID) | ORAL | 2 refills | Status: DC

## 2018-10-17 MED ORDER — SIMVASTATIN 40 MG PO TABS: 40.0000 mg | ORAL_TABLET | Freq: Every day | ORAL | 3 refills | Status: DC

## 2018-10-26 ENCOUNTER — Ambulatory Visit: Payer: BLUE CROSS/BLUE SHIELD | Admitting: Internal Medicine

## 2018-10-26 ENCOUNTER — Telehealth: Payer: Self-pay

## 2018-10-26 DIAGNOSIS — E7800 Pure hypercholesterolemia, unspecified: Secondary | ICD-10-CM

## 2018-10-26 NOTE — Telephone Encounter (Signed)
Pt called stating that she had to stop zocor because it was giving her flu like symptoms and she wants to know if you can change her to something else?

## 2018-10-26 NOTE — Telephone Encounter (Signed)
Switch to Livalo 2 mg daily

## 2018-10-27 MED ORDER — PITAVASTATIN CALCIUM 2 MG PO TABS: 2.0000 mg | ORAL_TABLET | Freq: Every day | ORAL | 3 refills | Status: DC

## 2018-10-27 NOTE — Telephone Encounter (Signed)
Pt aware of medication switch

## 2018-11-01 ENCOUNTER — Telehealth: Payer: Self-pay

## 2018-11-01 NOTE — Telephone Encounter (Signed)
Pt called and wanted you to know that she had chest pain last week and was told by her PCP that she was dehydrated and to not work for 2 weeks. Pt also wants to know if you can write her a note to go part time 30 hours a week until she sees you next which is on 01/20/19

## 2018-11-03 ENCOUNTER — Telehealth: Payer: Self-pay

## 2018-11-03 NOTE — Telephone Encounter (Signed)
PA was approved for Livalo 2mg  from 11/01/18-10/30/21

## 2018-11-03 NOTE — Telephone Encounter (Signed)
Pt called to say her BP is 98/77 since last Friday,. when taking medicine, also has dizziness.

## 2018-11-04 ENCOUNTER — Telehealth: Payer: Self-pay

## 2018-11-04 ENCOUNTER — Telehealth: Payer: Self-pay | Admitting: Cardiology

## 2018-11-04 MED ORDER — LABETALOL HCL 100 MG PO TABS: 50.0000 mg | ORAL_TABLET | Freq: Every day | ORAL | 0 refills | Status: DC

## 2018-11-04 NOTE — Telephone Encounter (Signed)
Tonya Boyd wants to know if you will put an order in for her to go to Cardiac Rehab here instead of [facility].

## 2018-11-04 NOTE — Telephone Encounter (Signed)
Pt called again and wanted to know what she should do about her BP dropping

## 2018-11-05 NOTE — Telephone Encounter (Signed)
Yes, the order is in general  For [facility], and hence can be transferable. Devonna, can you please look into this if she has a problem, will have patient call rehab and request transfer to [facility] main campus   From [place].

## 2018-11-05 NOTE — Telephone Encounter (Signed)
Patient can  Decrease the dose of isosorbide to 1/2 daily  or labetalol to 1/2 BID the medication and to call if problems.

## 2018-11-08 NOTE — Telephone Encounter (Signed)
That is good. As long as she is feeling better

## 2018-11-08 NOTE — Telephone Encounter (Signed)
Pt has stopped labetalol since Sunday. bp is better.

## 2018-11-10 NOTE — Telephone Encounter (Signed)
Spoke with patient and she has spoken with cardiac rehab.  She is going to [facility] to do this

## 2018-11-14 ENCOUNTER — Other Ambulatory Visit: Payer: Self-pay | Admitting: Cardiology

## 2018-11-14 ENCOUNTER — Other Ambulatory Visit: Payer: Self-pay

## 2018-11-16 ENCOUNTER — Telehealth (HOSPITAL_COMMUNITY): Payer: Self-pay

## 2018-11-16 NOTE — Telephone Encounter (Signed)
Pt insurance is active and benefits verified through [employer] Co-pay 0, DED $3,500/$3,500 met, out of pocket $6,600/$6,600 met, co-insurance 0. no pre-authorization required, REF# [id number] Tonya Boyd. Support Rep II

## 2018-11-16 NOTE — Telephone Encounter (Signed)
Called patient to see if she was interested in participating in the Cardiac Rehab Program. Patient stated yes. Patient will come in for orientation on 12/06/2018 @ 7:30am and will attend the 2:45pm exercise class.  Mailed homework package. Tempie Donning. Support Rep II

## 2018-11-22 ENCOUNTER — Telehealth: Payer: Self-pay

## 2018-11-22 NOTE — Telephone Encounter (Signed)
Can try daily Metamucil and or Miralax

## 2018-11-22 NOTE — Telephone Encounter (Signed)
Pt called c/o not being able to go to the bathroom, she is constipated and it has been about a month; can we change this

## 2018-11-22 NOTE — Telephone Encounter (Signed)
Can you check with Dr. Jacinto Halim. She sees him outpatient.

## 2018-11-22 NOTE — Telephone Encounter (Signed)
Pt states that she did try that, she thinks that its her ranexa

## 2018-11-23 NOTE — Telephone Encounter (Signed)
It is Ranexa, fairly common and if severe, to stop the pill or see if she can take 1/2 twice daily along with Miralax

## 2018-11-23 NOTE — Telephone Encounter (Signed)
What is the message?

## 2018-11-24 NOTE — Telephone Encounter (Signed)
lmom with details

## 2018-11-29 ENCOUNTER — Telehealth (HOSPITAL_COMMUNITY): Payer: Self-pay

## 2018-11-29 NOTE — Telephone Encounter (Signed)
Attempted to contact pt in regards to CR, left voicemail advising pt we had to cancel their orientation appt. And our department will be closed for the next 4 weeks due to the COVID-19. Once we resume scheduling we will contact pt to reschedule. °

## 2018-12-06 ENCOUNTER — Ambulatory Visit (HOSPITAL_COMMUNITY): Payer: BLUE CROSS/BLUE SHIELD

## 2018-12-12 ENCOUNTER — Ambulatory Visit (HOSPITAL_COMMUNITY): Payer: BLUE CROSS/BLUE SHIELD

## 2018-12-14 ENCOUNTER — Ambulatory Visit (HOSPITAL_COMMUNITY): Payer: BLUE CROSS/BLUE SHIELD

## 2018-12-16 ENCOUNTER — Ambulatory Visit (HOSPITAL_COMMUNITY): Payer: BLUE CROSS/BLUE SHIELD

## 2018-12-19 ENCOUNTER — Ambulatory Visit (HOSPITAL_COMMUNITY): Payer: BLUE CROSS/BLUE SHIELD

## 2018-12-20 ENCOUNTER — Telehealth (HOSPITAL_COMMUNITY): Payer: Self-pay | Admitting: *Deleted

## 2018-12-20 NOTE — Telephone Encounter (Addendum)
Called and left message for pt in regardsCal to continued cardiac rehab departmental closure due to Covid-19 national recommendations. Requested call back, contact information provided. Alanson Aly, BSN Cardiac and Emergency planning/management officer

## 2018-12-21 ENCOUNTER — Ambulatory Visit (HOSPITAL_COMMUNITY): Payer: BLUE CROSS/BLUE SHIELD

## 2018-12-23 ENCOUNTER — Ambulatory Visit (HOSPITAL_COMMUNITY): Payer: BLUE CROSS/BLUE SHIELD

## 2018-12-26 ENCOUNTER — Telehealth: Payer: Self-pay

## 2018-12-26 ENCOUNTER — Ambulatory Visit (HOSPITAL_COMMUNITY): Payer: BLUE CROSS/BLUE SHIELD

## 2018-12-26 NOTE — Telephone Encounter (Signed)
Awaiting call back.

## 2018-12-26 NOTE — Telephone Encounter (Signed)
Ok what mg?

## 2018-12-26 NOTE — Telephone Encounter (Signed)
She really should be on a statin. What statins has she tried? If she hasn't tried Livalo, we can try that.

## 2018-12-26 NOTE — Telephone Encounter (Signed)
Looking back through her chart, there is a telephone encounter advising start Livalo and she had PA approval, but the med list states that she is on simvastatin. Clarify which one she is on.

## 2018-12-28 ENCOUNTER — Ambulatory Visit (HOSPITAL_COMMUNITY): Payer: BLUE CROSS/BLUE SHIELD

## 2018-12-30 ENCOUNTER — Ambulatory Visit (HOSPITAL_COMMUNITY): Payer: BLUE CROSS/BLUE SHIELD

## 2019-01-02 ENCOUNTER — Ambulatory Visit (HOSPITAL_COMMUNITY): Payer: BLUE CROSS/BLUE SHIELD

## 2019-01-03 ENCOUNTER — Telehealth: Payer: Self-pay | Admitting: Cardiology

## 2019-01-03 MED ORDER — EVOLOCUMAB 140 MG/ML ~~LOC~~ SOSY: 140.0000 mg | PREFILLED_SYRINGE | SUBCUTANEOUS | 1 refills | Status: DC

## 2019-01-03 NOTE — Telephone Encounter (Signed)
Front staff-FE aware//ah

## 2019-01-03 NOTE — Telephone Encounter (Signed)
Pt called stating that she has not heard back regarding statin. She is not taking livalo nor simvastatin due to SE. Currently not on any statin. She has labs done yesterday and faxed over to [place]. Please advise on statin.//ah

## 2019-01-03 NOTE — Telephone Encounter (Signed)
I discussed with patient over the phone and created new telephone encounter. She is going to start Repatha. She is planning on coming to office on Monday after work (2pm) to get a sample and will need to be given in office to show her how to do this. Please ask front staff to schedule on nurse schedule. Thanks

## 2019-01-03 NOTE — Telephone Encounter (Signed)
As patient has not tolerated 4 statins including Crestor, lipitor, simvastatin, and livalo and in view of her CAD, she would benefit from PCSK9 inhibitor. We will provide sample and work towards approval. Side effects were discussed with patient and patient agrees to start medicaiton.

## 2019-01-04 ENCOUNTER — Ambulatory Visit (HOSPITAL_COMMUNITY): Payer: BLUE CROSS/BLUE SHIELD

## 2019-01-06 ENCOUNTER — Ambulatory Visit (HOSPITAL_COMMUNITY): Payer: BLUE CROSS/BLUE SHIELD

## 2019-01-09 ENCOUNTER — Ambulatory Visit (HOSPITAL_COMMUNITY): Payer: BLUE CROSS/BLUE SHIELD

## 2019-01-09 ENCOUNTER — Ambulatory Visit (INDEPENDENT_AMBULATORY_CARE_PROVIDER_SITE_OTHER): Payer: BLUE CROSS/BLUE SHIELD | Admitting: Cardiology

## 2019-01-09 ENCOUNTER — Other Ambulatory Visit: Payer: Self-pay

## 2019-01-09 DIAGNOSIS — E7800 Pure hypercholesterolemia, unspecified: Secondary | ICD-10-CM | POA: Diagnosis not present

## 2019-01-09 MED ORDER — EVOLOCUMAB 140 MG/ML ~~LOC~~ SOSY: 140.0000 mg | PREFILLED_SYRINGE | Freq: Once | SUBCUTANEOUS | Status: DC

## 2019-01-09 MED ORDER — EVOLOCUMAB 140 MG/ML ~~LOC~~ SOSY: 140.0000 mg | PREFILLED_SYRINGE | SUBCUTANEOUS | 1 refills | Status: DC

## 2019-01-09 NOTE — Telephone Encounter (Signed)
Please sign off on the injection and send repatha to [facility] she was approved until 12/2019;

## 2019-01-09 NOTE — Progress Notes (Signed)
Nurse visit for Repatha injection. Tolerated injection well.

## 2019-01-11 ENCOUNTER — Ambulatory Visit (HOSPITAL_COMMUNITY): Payer: BLUE CROSS/BLUE SHIELD

## 2019-01-13 ENCOUNTER — Ambulatory Visit (HOSPITAL_COMMUNITY): Payer: BLUE CROSS/BLUE SHIELD

## 2019-01-13 ENCOUNTER — Telehealth: Payer: Self-pay | Admitting: Cardiology

## 2019-01-13 NOTE — Telephone Encounter (Signed)
FYI  Pat called this morning and said she was having angina. She took nitrogylcerin and it helped. She had cut down on her BP meds and thinks that may have caused it. Her work did an Publishing copy this morning and she will fax it over. They told her it was normal.

## 2019-01-16 ENCOUNTER — Ambulatory Visit (HOSPITAL_COMMUNITY): Payer: BLUE CROSS/BLUE SHIELD

## 2019-01-18 ENCOUNTER — Ambulatory Visit (HOSPITAL_COMMUNITY): Payer: BLUE CROSS/BLUE SHIELD

## 2019-01-20 ENCOUNTER — Other Ambulatory Visit: Payer: Self-pay

## 2019-01-20 ENCOUNTER — Encounter: Payer: Self-pay | Admitting: Cardiology

## 2019-01-20 ENCOUNTER — Ambulatory Visit: Payer: BLUE CROSS/BLUE SHIELD | Admitting: Cardiology

## 2019-01-20 ENCOUNTER — Ambulatory Visit (HOSPITAL_COMMUNITY): Payer: BLUE CROSS/BLUE SHIELD

## 2019-01-20 VITALS — BP 148/93 | HR 99 | Ht 64.5 in | Wt 186.0 lb

## 2019-01-20 DIAGNOSIS — I7300 Raynaud's syndrome without gangrene: Secondary | ICD-10-CM

## 2019-01-20 DIAGNOSIS — I25118 Atherosclerotic heart disease of native coronary artery with other forms of angina pectoris: Secondary | ICD-10-CM

## 2019-01-20 DIAGNOSIS — I209 Angina pectoris, unspecified: Secondary | ICD-10-CM

## 2019-01-20 MED ORDER — ISOSORBIDE MONONITRATE ER 30 MG PO TB24: 30.0000 mg | ORAL_TABLET | Freq: Every day | ORAL | 1 refills | Status: DC

## 2019-01-20 NOTE — Progress Notes (Signed)
Virtual Visit via Video Note: This visit type was conducted due to national recommendations for restrictions regarding the COVID-19 Pandemic (e.g. social distancing).  This format is felt to be most appropriate for this patient at this time.  All issues noted in this document were discussed and addressed.  No physical exam was performed (except for noted visual exam findings with Telehealth visits).  The patient has consented to conduct a Telehealth visit and understands insurance will be billed.   I connected with@, on 01/20/19 at  by a video enabled telemedicine application and verified that I am speaking with the correct person using two identifiers.   I discussed the limitations of evaluation and management by telemedicine and the availability of in person appointments. The patient expressed understanding and agreed to proceed.   I have discussed with patient regarding the safety during COVID Pandemic and steps and precautions to be taken including social distancing, frequent hand wash and use of detergent soap, gels with the patient. I asked the patient to avoid touching mouth, nose, eyes, ears with the hands. I encouraged regular walking around the neighborhood and exercise and regular diet, as long as social distancing can be maintained.  Primary Physician/Referring:  Janie Morning, DO  Patient ID: Tonya Boyd, female    DOB: 1955-10-24, 63 y.o.   MRN: [id number]  Chief Complaint  Patient presents with  . Coronary Artery Disease    HPI: Tonya Boyd  is a 63 y.o. female  with raynaud's disease and GERD. Due to worsening dyspnea and also chest pain and abnormal nuclear stress test revealing inferior ischemia, event monitor showing NSVT, patient underwent coronary angiogram and was found to have a very large RCA dominant with a high-grade 80% stenosis for which she underwent angioplasty on 08/02/2018. Normal right heart catheterization.  Patient continues to have anginal symptoms  with chest tightness with radiation to her neck especially when she wakes up in the morning and states that it is responsive to isosorbide mononitrate 60 mg but she is only able to take half a tablet but even then drops of blood pressure down to 665 systolic and she feels extremely fatigued and weak with this.  She tried Ranexa but caused significant constipation.  Otherwise she states that she has been doing really well and thinks of cutting down her hours to 30 hours a day as she feels work is been stressful and precipitates angina pectoris.  Past Medical History:  Diagnosis Date  . Angina pectoris ([place])   . Asthma    "as a child and as an adult" (08/02/2018)  . CAD (coronary artery disease), native coronary artery 08/03/2018  . Chronic bronchitis ([place])   . Chronic kidney disease   . Coronary artery disease   . Family history of adverse reaction to anesthesia    "cousin stopped breathing; he was allergic to the anesthesia" (08/02/2018)  . GERD (gastroesophageal reflux disease)   . High cholesterol   . History of gout   . History of hiatal hernia   . History of kidney stones   . Hypertension 07/2018  . Interstitial cystitis   . NSVT (nonsustained ventricular tachycardia) ([place]) 07/2018  . Raynaud's disease    "feet and hands" (08/02/2018)  . Raynaud's disease without gangrene 10/15/2018    Past Surgical History:  Procedure Laterality Date  . ABDOMINAL HYSTERECTOMY    . ANTERIOR CERVICAL DECOMP/DISCECTOMY FUSION    . APPENDECTOMY    . AUGMENTATION MAMMAPLASTY Bilateral   . BACK  SURGERY    . CORONARY ANGIOPLASTY WITH STENT PLACEMENT  08/02/2018  . CORONARY STENT INTERVENTION N/A 08/02/2018   Procedure: CORONARY STENT INTERVENTION;  Surgeon: Adrian Prows, MD;  Location: [facility];  Service: Cardiovascular;  Laterality: N/A;  RCA   . CYSTOSCOPY W/ STONE MANIPULATION    . INTRAVASCULAR PRESSURE WIRE/FFR STUDY N/A 10/07/2018   Procedure: INTRAVASCULAR PRESSURE WIRE/FFR STUDY;   Surgeon: Adrian Prows, MD;  Location: [facility];  Service: Cardiovascular;  Laterality: N/A;  . KNEE ARTHROSCOPY Right   . LAPAROSCOPIC CHOLECYSTECTOMY    . LEFT HEART CATH AND CORONARY ANGIOGRAPHY N/A 10/07/2018   Procedure: LEFT HEART CATH AND CORONARY ANGIOGRAPHY;  Surgeon: Adrian Prows, MD;  Location: [facility];  Service: Cardiovascular;  Laterality: N/A;  . REPAIR ANKLE LIGAMENT Left    "tied up ligament; had tore up qthing in my ankle"  . RIGHT HEART CATH  10/07/2018  . RIGHT/LEFT HEART CATH AND CORONARY ANGIOGRAPHY N/A 08/02/2018   Procedure: RIGHT/LEFT HEART CATH AND CORONARY ANGIOGRAPHY;  Surgeon: Adrian Prows, MD;  Location: [facility];  Service: Cardiovascular;  Laterality: N/A;  . SHOULDER SURGERY     bilateral  . TONSILLECTOMY    . TUBAL LIGATION      Social History   Socioeconomic History  . Marital status: Married    Spouse name: Not on file  . Number of children: Not on file  . Years of education: GED  . Highest education level: Not on file  Occupational History    Employer: [employer]  Social Needs  . Financial resource strain: Not on file  . Food insecurity:    Worry: Not on file    Inability: Not on file  . Transportation needs:    Medical: Not on file    Non-medical: Not on file  Tobacco Use  . Smoking status: Never Smoker  . Smokeless tobacco: Never Used  Substance and Sexual Activity  . Alcohol use: Not Currently    Comment: 08/02/2018 "not since my 20's"  . Drug use: Never  . Sexual activity: Not on file  Lifestyle  . Physical activity:    Days per week: Not on file    Minutes per session: Not on file  . Stress: Not on file  Relationships  . Social connections:    Talks on phone: Not on file    Gets together: Not on file    Attends religious service: Not on file    Active member of club or organization: Not on file    Attends meetings of clubs or organizations: Not on file    Relationship status: Not on  file  . Intimate partner violence:    Fear of current or ex partner: Not on file    Emotionally abused: Not on file    Physically abused: Not on file    Forced sexual activity: Not on file  Other Topics Concern  . Not on file  Social History Narrative  . Not on file    Review of Systems  Constitution: Negative for chills, decreased appetite, malaise/fatigue and weight gain.  Cardiovascular: Positive for chest pain and dyspnea on exertion (stable). Negative for irregular heartbeat, leg swelling and syncope.  Endocrine: Positive for cold intolerance (raynaud's disease).  Hematologic/Lymphatic: Does not bruise/bleed easily.  Skin: Negative for color change and nail changes.  Musculoskeletal: Negative for joint swelling.  Gastrointestinal: Negative for abdominal pain, anorexia, change in bowel habit, hematochezia and melena.  Neurological:  Negative for headaches and light-headedness.  Psychiatric/Behavioral: Negative for depression and substance abuse.  All other systems reviewed and are negative.     Objective  Blood pressure (!) 148/93, pulse 99, height 5' 4.5" (1.638 m), weight 186 lb (84.4 kg). Body mass index is 31.43 kg/m. Physical exam not performed or limited due to virtual visit.  Patient appeared to be in no distress, Neck was supple, respiration was not labored.  Please see exam details from prior visit is as below.    Physical Exam  Constitutional: She appears well-developed and well-nourished. No distress.  HENT:  Head: Atraumatic.  Eyes: Conjunctivae are normal.  Neck: Neck supple. No JVD present. No thyromegaly present.  Cardiovascular: Normal rate, regular rhythm, normal heart sounds and intact distal pulses. Exam reveals no gallop.  No murmur heard. Pulmonary/Chest: Effort normal and breath sounds normal.  Abdominal: Soft. Bowel sounds are normal.  Musculoskeletal: Normal range of motion.  Neurological: She is alert.  Skin: Skin is warm and dry.  Psychiatric:  She has a normal mood and affect.   Radiology: No results found.  Laboratory examination:   10/03/2018: Creatinine 0.9, EGFR 64/78, potassium 4.3, BMP normal.  RBC 3.9, CBC otherwise normal.  Labs 07/29/2018: HB 14.9/HCT 44.0, platelets 275.  Normal indicis.  Serum glucose 94 mg, BUN 22, creatinine 0.93, eGFR 67 mL, potassium 4.4.  CMP Latest Ref Rng & Units 10/07/2018 08/03/2018 01/09/2017  Glucose 70 - 99 mg/dL 86 104(H) 101(H)  BUN 8 - 23 mg/dL 19 16 19   Creatinine 0.44 - 1.00 mg/dL 0.92 0.91 0.84  Sodium 135 - 145 mmol/L 140 138 141  Potassium 3.5 - 5.1 mmol/L 4.1 3.9 3.9  Chloride 98 - 111 mmol/L 108 107 106  CO2 22 - 32 mmol/L 20(L) 25 26  Calcium 8.9 - 10.3 mg/dL 9.3 8.7(L) 9.6   CBC Latest Ref Rng & Units 10/07/2018 08/03/2018 07/08/2018  WBC 4.0 - 10.5 K/uL 6.1 11.4(H) 8.2  Hemoglobin 12.0 - 15.0 g/dL 12.5 11.8(L) 14.3  Hematocrit 36.0 - 46.0 % 39.6 36.5 41.8  Platelets 150 - 400 K/uL 232 219 267.0   Lipid Panel  No results found for: CHOL, TRIG, HDL, CHOLHDL, VLDL, LDLCALC, LDLDIRECT HEMOGLOBIN A1C No results found for: HGBA1C, MPG TSH No results for input(s): TSH in the last 8760 hours.  PRN Meds:. Medications Discontinued During This Encounter  Medication Reason  . labetalol (NORMODYNE) 100 MG tablet Patient Preference  . isosorbide mononitrate (IMDUR) 60 MG 24 hr tablet    Current Meds  Medication Sig  . aspirin EC 81 MG tablet Take 81 mg by mouth daily.  . clopidogrel (PLAVIX) 75 MG tablet Take 1 tablet (75 mg total) by mouth daily with breakfast.  . cyclobenzaprine (FLEXERIL) 10 MG tablet Take 10 mg by mouth at bedtime.  Marland Kitchen dexlansoprazole (DEXILANT) 60 MG capsule Take 60 mg by mouth daily.  . Evolocumab (REPATHA) 140 MG/ML SOSY Inject 140 mg into the skin every 14 (fourteen) days.  . isosorbide mononitrate (IMDUR) 30 MG 24 hr tablet Take 1 tablet (30 mg total) by mouth daily for 30 days. 1/2 after waking in am and 2-3 hours later  . nitroGLYCERIN (NITROSTAT)  0.4 MG SL tablet Place 1 tablet (0.4 mg total) under the tongue every 5 (five) minutes as needed for chest pain.  Marland Kitchen oxybutynin (DITROPAN) 5 MG tablet Take 2.5 mg by mouth at bedtime.   . ranolazine (RANEXA) 1000 MG SR tablet Take 1 tablet (1,000 mg total) by mouth 2 (  two) times daily. (Patient taking differently: Take 500 mg by mouth daily. Causes constipation)  . triazolam (HALCION) 0.125 MG tablet Take 0.125 mg by mouth at bedtime.  . verapamil (CALAN-SR) 120 MG CR tablet TAKE 1 TABLET BY MOUTH ONCE A DAY.  Marland Kitchen Vitamin D, Ergocalciferol, (DRISDOL) 1.25 MG (50000 UT) CAPS capsule Take 50,000 Units by mouth every 7 (seven) days. Tuesdays  . [DISCONTINUED] isosorbide mononitrate (IMDUR) 60 MG 24 hr tablet TAKE (1) TABLET BY MOUTH EACH MORNING. (Patient taking differently: Take 0.5 mg by mouth. Lowers bp)   Current Facility-Administered Medications for the 01/20/19 encounter (Office Visit) with Adrian Prows, MD  Medication  . Evolocumab SOSY 140 mg  . Evolocumab SOSY 140 mg    Cardiac Studies:   Coronary angiogram 10/07/2018: No change from 08/02/2018 except D1 stenosis 50-60%, DFR negative. Normal right heart catheterization. The left ventricular systolic function is normal. LV end diastolic pressure is normal. Superdominant large RCA. Prox RCA to Mid RCA lesion is 80% stenosed S/P STENT ORSIRO DES 4.0X30 with 0% residual stenosis. Ost RPDA lesion is 50% stenosed. Small LAD ends before reaching apex.  7 day event monitor 07/13/2018-07/19/2018: Dominant rhythm: Normal sinus rhythm.  11 patient activated events occurred with symptoms of chest pain, shortness of breath, rapid heart rate, correlating with sinus tachycardia.  Fastest tachycardia episode was day one at 1400 with 133 beats per without reported symptoms.  When autodetect event of sinus rhythm with 6 beats of NSVT on day 2 10:51 AM with symptoms of chest pain, shortness of breath.  No A. fib or SVT was noted.  Echocardiogram 07/23/2018:  Normal LV systolic function, grade 1 diastolic dysfunction, mildly calcified aortic leaflets, no significant valvular abnormality.  Lexiscan Sestamibi stress test 07/22/2018:  1. Lexiscan stress test was performed. Exercise capacity was not assessed. Stress symptoms included chest pain, dyspnea, nausea, dizziness. Peak blood pressure was 148/98 mmHg. The resting and stress electrocardiogram demonstrated normal sinus rhythm, normal resting conduction, no resting arrhythmias and normal rest repolarization.  Stress EKG is non diagnostic for ischemia as it is a pharmacologic stress.  2. The overall quality of the study is good.  Left ventricular cavity is noted to be normal on the rest and stress studies.  Gated SPECT images reveal normal myocardial thickening and wall motion.  The left ventricular ejection fraction was calculated 45%, although visually appears normal. SPECT images reveal small area of mild intensity, reversible perfusion defect in apical inferolateral myocardium, that may represent ischemia.  3. Intermediate risk study due to small perfusion defect and mildly reduced LVEF. Recommend correlation with echocardiogram.  Assessment   Angina pectoris ([place]) - Plan: isosorbide mononitrate (IMDUR) 30 MG 24 hr tablet  Coronary artery disease of native artery of native heart with stable angina pectoris ([place]) 10/07/18: Patent Prox RCA ORSIRO DES 4.0X30   Raynaud's disease without gangrene  EKG 10/03/2018: Normal sinus rhythm at rate of 91 bpm, left axis deviation, left anterior fascicular block.  Diffuse nonspecific T abnormality.  Borderline low voltage complexes. 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  Chest pain is responsive to nitroglycerin but drops of blood pressure significantly.  She is  presently taking 60 mg 1/2 tablet daily, will switch to 30 mg daily tablets and advised her to take 1/2 tablet when she wakes up in the  morning and the other half 2 to 3 hours later and see how she responds to this.  I also advised her to use Argenaid supplement for angina pectoris.  Otherwise with regard to coronary artery disease, she states that she is feeling well and has mild dyspnea that is remained stable.  She has not had exertional chest pain that she used to have prior to angioplasty.  Lipids are normal, her blood pressure is very well controlled except today it was high but states that her blood pressure is never greater than 120 mmHg to 446 mmHg systolic and diastolic blood pressure around 70 to 80 mmHg.  Hence I did not make any changes to her medications with regard to hypertension.  I would like to see her back in 6 months for follow-up.  She plans on reducing her work hours to 30 hours as she has been having frequent episodes of angina pectoris and stress precipitates her anginal symptoms as well.  Adrian Prows, MD, [facility] 01/20/2019, 1:33 PM [facility] [place] Pager: [phone] Office: [phone] If no answer Cell [phone]

## 2019-01-23 ENCOUNTER — Telehealth (HOSPITAL_COMMUNITY): Payer: Self-pay | Admitting: *Deleted

## 2019-01-23 ENCOUNTER — Ambulatory Visit (HOSPITAL_COMMUNITY): Payer: BLUE CROSS/BLUE SHIELD

## 2019-01-23 NOTE — Telephone Encounter (Signed)
Follow-up call placed to patient regarding Cardiac Rehab referral and information mailed regarding exercise and heart healthy eating. Message left for patient on voicemail.  Artist Pais, MS, ACSM CEP [phone]

## 2019-01-24 ENCOUNTER — Telehealth: Payer: Self-pay

## 2019-01-24 NOTE — Telephone Encounter (Signed)
Pt called stating the Argenaid that you wanted her to start cannot be found anywhere but online and it was $23 for 14 tabs and $159 for a months supply.  She did contact [facility] in [place] and they said that they can get it for her if we send a script to them. Please advise.//ah

## 2019-01-24 NOTE — Telephone Encounter (Signed)
It comes as a sachet, and yes 14 will cost 22 and she needs one sachet for a day. Yes you can send Rx and hope to be covered by insurance Argenaid

## 2019-01-25 ENCOUNTER — Ambulatory Visit (HOSPITAL_COMMUNITY): Payer: BLUE CROSS/BLUE SHIELD

## 2019-01-25 MED ORDER — ARGINAID PO PACK: 1.0000 | PACK | Freq: Every day | ORAL | 1 refills | Status: DC

## 2019-01-25 NOTE — Telephone Encounter (Signed)
Pt aware of rx sent to pharmacy but wants to clarify if this med is replacing the ranexa or another med or is it just an addition. She does not know what it is for.//ah

## 2019-01-27 ENCOUNTER — Ambulatory Visit (HOSPITAL_COMMUNITY): Payer: BLUE CROSS/BLUE SHIELD

## 2019-01-27 ENCOUNTER — Telehealth: Payer: Self-pay

## 2019-01-27 NOTE — Telephone Encounter (Signed)
Pt called and wanted to know if you could write her a work note to only work 30 hours per week.?

## 2019-01-28 ENCOUNTER — Encounter: Payer: Self-pay | Admitting: Cardiology

## 2019-01-28 NOTE — Telephone Encounter (Signed)
I have the letter in her chart that you can print and give. JG

## 2019-01-28 NOTE — Progress Notes (Signed)
To Whom It May Concern, [name] is a patient under my care.  Her medical condition does not allow her to work for more than 25-30 hours.  Would consider reducing her hours as stated above.  If more details are needed, we'll be happy to furnish them.  Pleased that started to contact our office.   Sincerely,  [name], MD, [facility] 01/28/2019, 4:50 AM [facility] [place] Pager: [phone] Office: [phone]

## 2019-01-31 NOTE — Telephone Encounter (Signed)
FAXED

## 2019-02-01 ENCOUNTER — Ambulatory Visit (HOSPITAL_COMMUNITY): Payer: BLUE CROSS/BLUE SHIELD

## 2019-02-03 ENCOUNTER — Ambulatory Visit (HOSPITAL_COMMUNITY): Payer: BLUE CROSS/BLUE SHIELD

## 2019-02-06 ENCOUNTER — Ambulatory Visit (HOSPITAL_COMMUNITY): Payer: BLUE CROSS/BLUE SHIELD

## 2019-02-08 ENCOUNTER — Ambulatory Visit (HOSPITAL_COMMUNITY): Payer: BLUE CROSS/BLUE SHIELD

## 2019-02-10 ENCOUNTER — Ambulatory Visit (HOSPITAL_COMMUNITY): Payer: BLUE CROSS/BLUE SHIELD

## 2019-02-13 ENCOUNTER — Ambulatory Visit (HOSPITAL_COMMUNITY): Payer: BLUE CROSS/BLUE SHIELD

## 2019-02-13 ENCOUNTER — Telehealth (HOSPITAL_COMMUNITY): Payer: Self-pay | Admitting: *Deleted

## 2019-02-13 NOTE — Telephone Encounter (Signed)
         Confirm Consent - In the setting of the current Covid19 crisis, you are scheduled for a phone visit with your Cardiac or Pulmonary team member.  Just as we do with many in-gym visits, in order for you to participate in this visit, we must obtain consent.  If you'd like, I can send this to your mychart (if signed up) or email for you to review.  Otherwise, I can obtain your verbal consent now.  By agreeing to a telephone visit, we'd like you to understand that the technology does not allow for your Cardiac or Pulmonary Rehab team member to perform a physical assessment, and thus may limit their ability to fully assess your ability to perform exercise programs. If your provider identifies any concerns that need to be evaluated in person, we will make arrangements to do so.  Finally, though the technology is pretty good, we cannot assure that it will always work on either your or our end and we cannot ensure that we have a secure connection.  Cardiac and Pulmonary Rehab Telehealth visits and "At Home" cardiac and pulmonary rehab are provided at no cost to you.               Are you willing to proceed?" STAFF: Did the patient verbally acknowledge consent to telehealth visit? Document YES/NO here: Tonya Boyd   Cardiac and Pulmonary Rehab Staff   02/13/2019 9:31 AM

## 2019-02-13 NOTE — Telephone Encounter (Signed)
Pt called to inquire about interest In participating in Virtual Cardiac Rehab.    Pt is interested in Virtual Cardiac Rehab.   Checklist: 1. Pt has smart device  ie smartphone and/or ipad for downloading an app  Yes 2. Reliable internet/wifi service    Yes 3. Understands how to use his smartphone and navigate within an app.  Yes  Reviewed with pt the scheduling process for virtual cardiac rehab.  Pt verbalized understanding.  Appointment will be placed for 02/14/2019 at 0820 per pt request.

## 2019-02-14 ENCOUNTER — Telehealth (HOSPITAL_COMMUNITY): Payer: Self-pay | Admitting: *Deleted

## 2019-02-14 ENCOUNTER — Encounter (HOSPITAL_COMMUNITY)
Admission: RE | Admit: 2019-02-14 | Discharge: 2019-02-14 | Disposition: A | Discharge status: Home / Self Care | Payer: Self-pay | Source: Ambulatory Visit | Attending: Cardiology | Admitting: Cardiology

## 2019-02-14 NOTE — Telephone Encounter (Addendum)
Pt is interested in participating in Virtual Cardiac Rehab. Pt advised that Virtual Cardiac Rehab is provided at no cost to the patient.  Checklist:  1. Pt has smart device  ie smartphone and/or ipad for downloading an app  Yes 2. Reliable internet/wifi service    Yes 3. Understands how to use their smartphone and navigate within an app.  Yes 4.  Reviewed with pt the scheduling process for virtual cardiac rehab.  Pt verbalized understanding.Called and spoke to pt regarding Virtual Cardiac Rehab.  Pt  was able to download the [facility] [employer] app on their smart device with no issues. Pt set up their account and received the following welcome message -"Welcome to the [facility] and Pulmonary Rehabilitation program. We hope that you will find the exercise program beneficial in your recovery process. Our staff is available to assist with any questions/concerns about your exercise routine. Best wishes". Brief orientation provided to with the advisement to watch the "Intro to Rehab" series located under the Resource tab. Pt verbalized understanding. Will continue to follow and monitor pt progress with feedback as needed.Barnet Pall, RN,BSN 02/14/2019 4:18 PM

## 2019-02-15 ENCOUNTER — Ambulatory Visit (HOSPITAL_COMMUNITY): Payer: BLUE CROSS/BLUE SHIELD

## 2019-02-17 ENCOUNTER — Ambulatory Visit (HOSPITAL_COMMUNITY): Payer: BLUE CROSS/BLUE SHIELD

## 2019-02-20 ENCOUNTER — Ambulatory Visit (HOSPITAL_COMMUNITY): Payer: BLUE CROSS/BLUE SHIELD

## 2019-02-22 ENCOUNTER — Ambulatory Visit (HOSPITAL_COMMUNITY): Payer: BLUE CROSS/BLUE SHIELD

## 2019-02-24 ENCOUNTER — Ambulatory Visit (HOSPITAL_COMMUNITY): Payer: BLUE CROSS/BLUE SHIELD

## 2019-02-27 ENCOUNTER — Ambulatory Visit (HOSPITAL_COMMUNITY): Payer: BLUE CROSS/BLUE SHIELD

## 2019-03-01 ENCOUNTER — Ambulatory Visit (HOSPITAL_COMMUNITY): Payer: BLUE CROSS/BLUE SHIELD

## 2019-03-03 ENCOUNTER — Other Ambulatory Visit: Payer: Self-pay

## 2019-03-03 ENCOUNTER — Ambulatory Visit (HOSPITAL_COMMUNITY): Payer: BLUE CROSS/BLUE SHIELD

## 2019-03-03 MED ORDER — PANTOPRAZOLE SODIUM 20 MG PO TBEC: 20.0000 mg | DELAYED_RELEASE_TABLET | Freq: Every day | ORAL | 0 refills | Status: DC

## 2019-03-03 MED ORDER — REPATHA 140 MG/ML ~~LOC~~ SOSY: 140.0000 mg | PREFILLED_SYRINGE | SUBCUTANEOUS | 4 refills | Status: DC

## 2019-03-06 ENCOUNTER — Ambulatory Visit (HOSPITAL_COMMUNITY): Payer: BLUE CROSS/BLUE SHIELD

## 2019-03-08 ENCOUNTER — Telehealth (HOSPITAL_COMMUNITY): Payer: Self-pay | Admitting: *Deleted

## 2019-03-08 ENCOUNTER — Telehealth: Payer: Self-pay

## 2019-03-08 ENCOUNTER — Ambulatory Visit (HOSPITAL_COMMUNITY): Payer: BLUE CROSS/BLUE SHIELD

## 2019-03-08 NOTE — Telephone Encounter (Signed)
Received message from pt in virtual cardiac rehab app, [facility], about having angina symptoms "all day" on 03/07/2019.  Per pt the pain lasted through the day but decreased some when she took her antianginal medications.  Pt did not take SL NTG as she felt "it wasn't severe enough."  Pt is fearful of becoming constipated if she takes Ranexa as well.  Reminded pt of importance to let cardiology offie know about angina and importance of medications.  PC to Dr. Irven Shelling office.  Message left with RN, Rachel Moulds.

## 2019-03-08 NOTE — Telephone Encounter (Signed)
Received message from pt in virtual cardiac rehab app, [facility], about having angina symptoms "all day" on 03/07/2019.  Per pt the pain lasted through the day but decreased some when she took her antianginal medications.  Pt did not take SL NTG as she felt "it wasn't severe enough."  Pt is fearful of becoming constipated if she takes Ranexa as well.  Reminded pt of importance to let cardiology offie know about angina and importance of medications.  PC to Dr. Irven Shelling office.  Message left with RN, Rachel Moulds.   This msg came from [place] in cardiac rehab; Please advise so I can call pt

## 2019-03-13 ENCOUNTER — Telehealth: Payer: Self-pay

## 2019-03-13 ENCOUNTER — Ambulatory Visit (HOSPITAL_COMMUNITY): Payer: BLUE CROSS/BLUE SHIELD

## 2019-03-13 DIAGNOSIS — I209 Angina pectoris, unspecified: Secondary | ICD-10-CM

## 2019-03-13 DIAGNOSIS — I10 Essential (primary) hypertension: Secondary | ICD-10-CM

## 2019-03-13 MED ORDER — CARVEDILOL 6.25 MG PO TABS: 6.2500 mg | ORAL_TABLET | Freq: Two times a day (BID) | ORAL | 3 refills | Status: DC

## 2019-03-13 NOTE — Telephone Encounter (Signed)
Patient had called about leg swelling after traveling for 4 hours, lower extremity venous duplex performed at outside facility today by Dr. Maudie Mercury revealed no evidence of DVT.  Both were elevated heart rate that she has chronically, and also elevated blood pressure, I have started her on carvedilol 6.25 mg p.o. twice daily.  Previously she did not tolerate labetalol as it caused to have profound hypotension.  She will let us know how she feels with the medication.

## 2019-03-13 NOTE — Telephone Encounter (Signed)
Pt called stating that she has been having lower extrem edema and some chest discomfort as if retaining fluid not cp, and elevated HR. She saw Dr. Maudie Mercury this morning. Please review ov note. BP 150/92 hr 97. Recommending adjusting verapamil and or isosorbide. Has doplar scheduled today that Dr. Maudie Mercury ordered. Weight also up 3lbs. Also Ranexa is constipating her. She cannot find the natural supplement you recommend she try with it.//ah

## 2019-03-15 ENCOUNTER — Ambulatory Visit (HOSPITAL_COMMUNITY): Payer: BLUE CROSS/BLUE SHIELD

## 2019-03-20 ENCOUNTER — Encounter (HOSPITAL_COMMUNITY): Payer: Self-pay

## 2019-03-20 NOTE — Progress Notes (Signed)
Followed up with pt, as she reported that she was having swelling and her feet and ankles that was prohibiting her from exercise via the virtual cardiac rehab app. Pt has been taking lasix since last week to reduce fluid build up. Pt reports that she still has some swelling, but that it has gotten a lot better. She states that she has not been exercising due to this. I encouraged her to do some exercise this week, even if it is just light walking for 15-20 minutes a day to get some type of cardiovascular activity. Pt voices understanding.

## 2019-03-22 ENCOUNTER — Telehealth (HOSPITAL_COMMUNITY): Payer: Self-pay

## 2019-04-06 ENCOUNTER — Other Ambulatory Visit: Payer: Self-pay | Admitting: Cardiology

## 2019-04-26 ENCOUNTER — Other Ambulatory Visit: Payer: Self-pay | Admitting: Cardiology

## 2019-05-05 ENCOUNTER — Other Ambulatory Visit: Payer: Self-pay | Admitting: Cardiology

## 2019-05-10 ENCOUNTER — Telehealth: Payer: Self-pay

## 2019-05-10 NOTE — Telephone Encounter (Signed)
-----   Message from Adrian Prows, MD sent at 05/09/2019  1:44 PM EDT ----- Regarding: Skin rash She called [place] about the rash on her skin. Please tell her that the skin rash and ecchymosis is from ASA and plavix. Ask her to stop Plavix but continue ASA. JG

## 2019-05-10 NOTE — Telephone Encounter (Signed)
Pt states about 1 month ago, she had a very high blood pressure and numbness. Her PCP said maybe she had a mini stroke and felt that she was on the proper medicine, she wanted you to know that.

## 2019-05-12 ENCOUNTER — Other Ambulatory Visit: Payer: Self-pay

## 2019-05-12 DIAGNOSIS — I209 Angina pectoris, unspecified: Secondary | ICD-10-CM

## 2019-05-12 MED ORDER — RANOLAZINE ER 1000 MG PO TB12: ORAL_TABLET | ORAL | 1 refills | Status: DC

## 2019-05-26 ENCOUNTER — Other Ambulatory Visit: Payer: Self-pay | Admitting: Cardiology

## 2019-05-30 ENCOUNTER — Telehealth: Payer: Self-pay

## 2019-05-30 NOTE — Telephone Encounter (Signed)
Can she increase her dose of Isosorbide?

## 2019-05-30 NOTE — Telephone Encounter (Signed)
So have her take it twice a day

## 2019-05-30 NOTE — Telephone Encounter (Signed)
I think she is taking 1/2 tab of 30 mg in AM and the other half a few hours later. Have her take 30 and then 30 to make 60 mg total

## 2019-05-30 NOTE — Telephone Encounter (Signed)
Occasional episodes of angina requiring her to use nitroglycerin are okay as long as the nitroglycerin helps. Need to know if they are increasing in frequency.

## 2019-05-30 NOTE — Telephone Encounter (Signed)
So they are happening often Sun,Mon and Tues she has never had it happen back to back like that

## 2019-05-30 NOTE — Telephone Encounter (Signed)
Chest pain  Last OV:    Onset: 05/28/2019  Location/radiation: L side   Duration: hurts until she takes nitro   Character/describing factors (sharp, pressure, etc.): sharp pain   Aggravating factors (position, exertion, coughing, etc): n/a  Reliving factors (nitroglycerin, resting, position changes, etc.): nitro  Timing/frequency (every time with activity, intermittent, daily, weekly, etc): random  Associated symptoms (nausea, sweating, SOB, etc): none  Additional info: R side of face, neck and arm pain last week; Went to pcp and she had a MRI didn't show TIA; She doesn't think that she is in danger she is just concerned; She says she forgets words sometimes   Current Outpatient Medications on File Prior to Visit  Medication Sig Dispense Refill  . aspirin EC 81 MG tablet Take 81 mg by mouth daily.    . carvedilol (COREG) 6.25 MG tablet Take 1 tablet (6.25 mg total) by mouth 2 (two) times daily. 60 tablet 3  . clopidogrel (PLAVIX) 75 MG tablet TAKE 1 TABLET BY MOUTH WITH BREAKFAST. 90 tablet 0  . cyclobenzaprine (FLEXERIL) 10 MG tablet Take 10 mg by mouth at bedtime.    Marland Kitchen dexlansoprazole (DEXILANT) 60 MG capsule Take 60 mg by mouth daily.    . Evolocumab (REPATHA) 140 MG/ML SOSY Inject 140 mg into the skin every 14 (fourteen) days. 140 mL 4  . isosorbide mononitrate (IMDUR) 30 MG 24 hr tablet Take 1 tablet (30 mg total) by mouth daily for 30 days. 1/2 after waking in am and 2-3 hours later 90 tablet 1  . labetalol (NORMODYNE) 100 MG tablet TAKE ONE TABLET BY MOUTH TWICE A DAY 180 tablet 0  . nitroGLYCERIN (NITROSTAT) 0.4 MG SL tablet Place 1 tablet (0.4 mg total) under the tongue every 5 (five) minutes as needed for chest pain. 30 tablet 2  . Nutritional Supplements (ARGINAID) PACK Take 1 packet by mouth daily. 30 each 1  . oxybutynin (DITROPAN) 5 MG tablet Take 2.5 mg by mouth at bedtime.   1  . pantoprazole (PROTONIX) 20 MG tablet TAKE 1 TABLET BY MOUTH ONCE A DAY. 90 tablet 0  .  ranolazine (RANEXA) 1000 MG SR tablet Take 1/2 tablet daily 90 tablet 1  . triazolam (HALCION) 0.125 MG tablet Take 0.125 mg by mouth at bedtime.    . verapamil (CALAN-SR) 120 MG CR tablet TAKE 1 TABLET BY MOUTH ONCE A DAY. 30 tablet 0  . Vitamin D, Ergocalciferol, (DRISDOL) 1.25 MG (50000 UT) CAPS capsule Take 50,000 Units by mouth every 7 (seven) days. Tuesdays     Current Facility-Administered Medications on File Prior to Visit  Medication Dose Route Frequency Provider Last Rate Last Dose  . Evolocumab SOSY 140 mg  140 mg Subcutaneous Once Adrian Prows, MD      . Evolocumab SOSY 140 mg  140 mg Subcutaneous Once Miquel Dunn, NP

## 2019-05-31 ENCOUNTER — Other Ambulatory Visit: Payer: Self-pay

## 2019-05-31 ENCOUNTER — Ambulatory Visit (INDEPENDENT_AMBULATORY_CARE_PROVIDER_SITE_OTHER): Payer: BC Managed Care – PPO | Admitting: Cardiology

## 2019-05-31 ENCOUNTER — Encounter: Payer: Self-pay | Admitting: Cardiology

## 2019-05-31 VITALS — BP 151/96 | HR 87 | Temp 97.7°F | Ht 64.5 in | Wt 183.4 lb

## 2019-05-31 DIAGNOSIS — I25118 Atherosclerotic heart disease of native coronary artery with other forms of angina pectoris: Secondary | ICD-10-CM | POA: Diagnosis not present

## 2019-05-31 DIAGNOSIS — R0789 Other chest pain: Secondary | ICD-10-CM

## 2019-05-31 DIAGNOSIS — I10 Essential (primary) hypertension: Secondary | ICD-10-CM | POA: Diagnosis not present

## 2019-05-31 DIAGNOSIS — I209 Angina pectoris, unspecified: Secondary | ICD-10-CM

## 2019-05-31 MED ORDER — ISOSORBIDE MONONITRATE ER 30 MG PO TB24: 30.0000 mg | ORAL_TABLET | Freq: Two times a day (BID) | ORAL | 3 refills | Status: DC

## 2019-05-31 MED ORDER — RANOLAZINE ER 500 MG PO TB12: 500.0000 mg | ORAL_TABLET | Freq: Two times a day (BID) | ORAL | 3 refills | Status: DC

## 2019-05-31 NOTE — Telephone Encounter (Signed)
Pt will discuss today in OV

## 2019-05-31 NOTE — Progress Notes (Signed)
Primary Physician/Referring:  Merrilee Seashore, MD  Patient ID: Tonya Boyd, female    DOB: [birth year], 63 y.o.   MRN: [id number]  Chief Complaint  Patient presents with  . Chest Pain    x 4 days  . Coronary Artery Disease    HPI: Tonya Boyd  is a 63 y.o. female  with raynaud's disease and GERD. Due to worsening dyspnea and also chest pain and abnormal nuclear stress test revealing inferior ischemia, event monitor showing NSVT, patient underwent coronary angiogram and was found to have a very large RCA dominant with a high-grade 80% stenosis for which she underwent angioplasty on 08/02/2018. Normal right heart catheterization.  Patient made an appt to see [place] today for chest pain for the last few days that radiates up to her jaw. Improves with nitroglycerin use. Symptoms are worse when she is at work, feels better when she is at home and not under stress. States that initially cutting back to 30 hours per week helped; however, the workload has increased and there is more to do in a shorter time frame. Gets tired with little activity.   She is on isosorbide mononitrate 60 mg but she is only able to take half a tablet but even then drops of blood pressure down to 292 systolic and she feels extremely fatigued and weak with this.  She tried Ranexa but caused significant constipation. Plavix was stopped due to abnormal bruising and rash.   She reportedly had TIA symptoms in early September with with waking up feeling brain fog, off balance,right side of her face and tongue felt numb and her right hand felt heavy. Seen by her PCP. Underwent MRI of the brain on 05/17/19 that was negative for acute CVA.  Symptoms have now improved.   Past Medical History:  Diagnosis Date  . Angina pectoris ([place])   . Asthma    "as a child and as an adult" (08/02/2018)  . CAD (coronary artery disease), native coronary artery 08/03/2018  . Chronic bronchitis ([place])   . Chronic kidney disease   .  Coronary artery disease   . Family history of adverse reaction to anesthesia    "cousin stopped breathing; he was allergic to the anesthesia" (08/02/2018)  . GERD (gastroesophageal reflux disease)   . High cholesterol   . History of gout   . History of hiatal hernia   . History of kidney stones   . Hypertension 07/2018  . Interstitial cystitis   . NSVT (nonsustained ventricular tachycardia) ([place]) 07/2018  . Raynaud's disease    "feet and hands" (08/02/2018)  . Raynaud's disease without gangrene 10/15/2018    Past Surgical History:  Procedure Laterality Date  . ABDOMINAL HYSTERECTOMY    . ANTERIOR CERVICAL DECOMP/DISCECTOMY FUSION    . APPENDECTOMY    . AUGMENTATION MAMMAPLASTY Bilateral   . BACK SURGERY    . CORONARY ANGIOPLASTY WITH STENT PLACEMENT  08/02/2018  . CORONARY STENT INTERVENTION N/A 08/02/2018   Procedure: CORONARY STENT INTERVENTION;  Surgeon: Adrian Prows, MD;  Location: [facility];  Service: Cardiovascular;  Laterality: N/A;  RCA   . CYSTOSCOPY W/ STONE MANIPULATION    . INTRAVASCULAR PRESSURE WIRE/FFR STUDY N/A 10/07/2018   Procedure: INTRAVASCULAR PRESSURE WIRE/FFR STUDY;  Surgeon: Adrian Prows, MD;  Location: [facility];  Service: Cardiovascular;  Laterality: N/A;  . KNEE ARTHROSCOPY Right   . LAPAROSCOPIC CHOLECYSTECTOMY    . LEFT HEART CATH AND CORONARY ANGIOGRAPHY N/A 10/07/2018   Procedure:  LEFT HEART CATH AND CORONARY ANGIOGRAPHY;  Surgeon: Adrian Prows, MD;  Location: [facility];  Service: Cardiovascular;  Laterality: N/A;  . REPAIR ANKLE LIGAMENT Left    "tied up ligament; had tore up qthing in my ankle"  . RIGHT HEART CATH  10/07/2018  . RIGHT/LEFT HEART CATH AND CORONARY ANGIOGRAPHY N/A 08/02/2018   Procedure: RIGHT/LEFT HEART CATH AND CORONARY ANGIOGRAPHY;  Surgeon: Adrian Prows, MD;  Location: [facility];  Service: Cardiovascular;  Laterality: N/A;  . SHOULDER SURGERY     bilateral  . TONSILLECTOMY    . TUBAL LIGATION       Social History   Socioeconomic History  . Marital status: Married    Spouse name: Not on file  . Number of children: 3  . Years of education: GED  . Highest education level: Not on file  Occupational History    Employer: [employer]  Social Needs  . Financial resource strain: Not on file  . Food insecurity    Worry: Not on file    Inability: Not on file  . Transportation needs    Medical: Not on file    Non-medical: Not on file  Tobacco Use  . Smoking status: Never Smoker  . Smokeless tobacco: Never Used  Substance and Sexual Activity  . Alcohol use: Not Currently    Comment: 08/02/2018 "not since my 20's"  . Drug use: Never  . Sexual activity: Not on file  Lifestyle  . Physical activity    Days per week: Not on file    Minutes per session: Not on file  . Stress: Not on file  Relationships  . Social Herbalist on phone: Not on file    Gets together: Not on file    Attends religious service: Not on file    Active member of club or organization: Not on file    Attends meetings of clubs or organizations: Not on file    Relationship status: Not on file  . Intimate partner violence    Fear of current or ex partner: Not on file    Emotionally abused: Not on file    Physically abused: Not on file    Forced sexual activity: Not on file  Other Topics Concern  . Not on file  Social History Narrative  . Not on file    Review of Systems  Constitution: Negative for chills, decreased appetite, malaise/fatigue and weight gain.  Cardiovascular: Positive for chest pain and dyspnea on exertion (stable). Negative for irregular heartbeat, leg swelling and syncope.  Endocrine: Positive for cold intolerance (raynaud's disease).  Hematologic/Lymphatic: Does not bruise/bleed easily.  Skin: Negative for color change and nail changes.  Musculoskeletal: Negative for joint swelling.  Gastrointestinal: Negative for abdominal pain, anorexia, change in bowel  habit, hematochezia and melena.  Neurological: Negative for headaches and light-headedness.  Psychiatric/Behavioral: Negative for depression and substance abuse.  All other systems reviewed and are negative.     Objective  Blood pressure (!) 151/96, pulse 87, temperature 97.7 F (36.5 C), height 5' 4.5" (1.638 m), weight 183 lb 6.4 oz (83.2 kg), SpO2 97 %. Body mass index is 30.99 kg/m.     Physical Exam  Constitutional: She appears well-developed and well-nourished. No distress.  HENT:  Head: Atraumatic.  Eyes: Conjunctivae are normal.  Neck: Neck supple. No JVD present. No thyromegaly present.  Cardiovascular: Normal rate, regular rhythm, normal heart sounds and intact distal pulses. Exam reveals no gallop.  No murmur heard. Pulmonary/Chest: Effort normal and breath sounds normal.  Abdominal: Soft. Bowel sounds are normal.  Musculoskeletal: Normal range of motion.  Neurological: She is alert.  Skin: Skin is warm and dry.  Psychiatric: She has a normal mood and affect.   Radiology: No results found.  Laboratory examination:   10/03/2018: Creatinine 0.9, EGFR 64/78, potassium 4.3, BMP normal.  RBC 3.9, CBC otherwise normal.  Labs 07/29/2018: HB 14.9/HCT 44.0, platelets 275.  Normal indicis.  Serum glucose 94 mg, BUN 22, creatinine 0.93, eGFR 67 mL, potassium 4.4.  CMP Latest Ref Rng & Units 10/07/2018 08/03/2018 01/09/2017  Glucose 70 - 99 mg/dL 86 104(H) 101(H)  BUN 8 - 23 mg/dL _0 Creatinine 0.44 - 1.00 mg/dL 0.92 0.91 0.84  Sodium 135 - 145 mmol/L 140 138 141  Potassium 3.5 - 5.1 mmol/L 4.1 3.9 3.9  Chloride 98 - 111 mmol/L 108 107 106  CO2 22 - 32 mmol/L 20(L) 25 26  Calcium 8.9 - 10.3 mg/dL 9.3 8.7(L) 9.6   CBC Latest Ref Rng & Units 10/07/2018 08/03/2018 07/08/2018  WBC 4.0 - 10.5 K/uL 6.1 11.4(H) 8.2  Hemoglobin 12.0 - 15.0 g/dL 12.5 11.8(L) 14.3  Hematocrit 36.0 - 46.0 % 39.6 36.5 41.8  Platelets 150 - 400 K/uL 232 219 267.0   Lipid Panel  No results  found for: CHOL, TRIG, HDL, CHOLHDL, VLDL, LDLCALC, LDLDIRECT HEMOGLOBIN A1C No results found for: HGBA1C, MPG TSH No results for input(s): TSH in the last 8760 hours.  PRN Meds:. Medications Discontinued During This Encounter  Medication Reason  . clopidogrel (PLAVIX) 75 MG tablet Discontinued by provider  . ranolazine (RANEXA) 1000 MG SR tablet Patient Preference  . ranolazine (RANEXA) 500 MG 12 hr tablet Discontinued by provider  . isosorbide mononitrate (IMDUR) 30 MG 24 hr tablet Reorder   Current Meds  Medication Sig  . aspirin EC 81 MG tablet Take 81 mg by mouth daily.  . carvedilol (COREG) 6.25 MG tablet Take 1 tablet (6.25 mg total) by mouth 2 (two) times daily.  . cyclobenzaprine (FLEXERIL) 10 MG tablet Take 10 mg by mouth at bedtime.  Marland Kitchen dexlansoprazole (DEXILANT) 60 MG capsule Take 60 mg by mouth daily.  . Evolocumab (REPATHA) 140 MG/ML SOSY Inject 140 mg into the skin every 14 (fourteen) days.  . isosorbide mononitrate (IMDUR) 30 MG 24 hr tablet Take 1 tablet (30 mg total) by mouth 2 (two) times daily. 1/2 after waking in am and 2-3 hours later  . labetalol (NORMODYNE) 100 MG tablet TAKE ONE TABLET BY MOUTH TWICE A DAY (Patient taking differently: Take 100 mg by mouth daily at 2 PM. )  . nitroGLYCERIN (NITROSTAT) 0.4 MG SL tablet Place 1 tablet (0.4 mg total) under the tongue every 5 (five) minutes as needed for chest pain.  . Nutritional Supplements (ARGINAID) PACK Take 1 packet by mouth daily.  Marland Kitchen oxybutynin (DITROPAN) 5 MG tablet Take 2.5 mg by mouth at bedtime.   . pantoprazole (PROTONIX) 20 MG tablet TAKE 1 TABLET BY MOUTH ONCE A DAY.  . triazolam (HALCION) 0.125 MG tablet Take 0.125 mg by mouth at bedtime.  . Vitamin D, Ergocalciferol, (DRISDOL) 1.25 MG (50000 UT) CAPS capsule Take 50,000 Units by mouth every 7 (seven) days. Tuesdays  . [DISCONTINUED] isosorbide mononitrate (IMDUR) 30 MG 24 hr tablet Take 1 tablet (30 mg total) by mouth daily for 30 days. 1/2 after waking  in am and 2-3 hours later  . [DISCONTINUED] ranolazine (RANEXA) 500 MG  12 hr tablet Take 1 tablet by mouth daily at 2 PM.  . [DISCONTINUED] verapamil (CALAN-SR) 120 MG CR tablet TAKE 1 TABLET BY MOUTH ONCE A DAY.   Current Facility-Administered Medications for the 05/31/19 encounter (Office Visit) with Miquel Dunn, NP  Medication  . Evolocumab SOSY 140 mg  . Evolocumab SOSY 140 mg    Cardiac Studies:   Coronary angiogram 10/07/2018: No change from 08/02/2018 except D1 stenosis 50-60%, DFR negative. Normal right heart catheterization. The left ventricular systolic function is normal. LV end diastolic pressure is normal. Superdominant large RCA. Prox RCA to Mid RCA lesion is 80% stenosed S/P STENT ORSIRO DES 4.0X30 with 0% residual stenosis. Ost RPDA lesion is 50% stenosed. Small LAD ends before reaching apex.  7 day event monitor 07/13/2018-07/19/2018: Dominant rhythm: Normal sinus rhythm.  11 patient activated events occurred with symptoms of chest pain, shortness of breath, rapid heart rate, correlating with sinus tachycardia.  Fastest tachycardia episode was day one at 1400 with 133 beats per without reported symptoms.  When autodetect event of sinus rhythm with 6 beats of NSVT on day 2 10:51 AM with symptoms of chest pain, shortness of breath.  No A. fib or SVT was noted.  Echocardiogram 07/23/2018: Normal LV systolic function, grade 1 diastolic dysfunction, mildly calcified aortic leaflets, no significant valvular abnormality.  Lexiscan Sestamibi stress test 07/22/2018:  1. Lexiscan stress test was performed. Exercise capacity was not assessed. Stress symptoms included chest pain, dyspnea, nausea, dizziness. Peak blood pressure was 148/98 mmHg. The resting and stress electrocardiogram demonstrated normal sinus rhythm, normal resting conduction, no resting arrhythmias and normal rest repolarization.  Stress EKG is non diagnostic for ischemia as it is a pharmacologic stress.  2. The  overall quality of the study is good.  Left ventricular cavity is noted to be normal on the rest and stress studies.  Gated SPECT images reveal normal myocardial thickening and wall motion.  The left ventricular ejection fraction was calculated 45%, although visually appears normal. SPECT images reveal small area of mild intensity, reversible perfusion defect in apical inferolateral myocardium, that may represent ischemia.  3. Intermediate risk study due to small perfusion defect and mildly reduced LVEF. Recommend correlation with echocardiogram.  Assessment   Angina pectoris ([place]) - Plan: EKG 12-Lead, isosorbide mononitrate (IMDUR) 30 MG 24 hr tablet  Coronary artery disease of native artery of native heart with stable angina pectoris ([place]) 10/07/18: Patent Prox RCA ORSIRO DES 4.0X30   Essential hypertension  EKG 05/31/2019: Normal sinus rhythm at 79 bpm, normal axis, no evidence of ischemia.   Recommendations:   Patient is here for acute visit due to increased frequency of angina of the last few days.  She has been under significant amount of stress and mostly has her symptoms of angina while at work.  Mostly feels well at home, but does get tired doing routine activities.  She has had normal coronary angiogram in January.  I continue to feel that her symptoms of angina are related to coronary spasm brought on by stress.  Her blood pressure is elevated today, but generally is stable at home.  She will try further increasing her Ranexa and if needed can also increase her isosorbide to 60 mg.  Can continue to use nitroglycerin as needed.  I have encouraged her to work on her stress.  She has cut back to 30 hours/week, but continues to have significant issues with her workload. Continue with other antihypertensive therapy.  I will make her  an appointment back to see [place] in the next few weeks for close monitoring of her symptoms, but encouraged her to contact me sooner if needed.   *I have discussed  this case with Dr. Einar Gip and he participated in formulating the plan.*    Miquel Dunn, MSN, APRN, FNP-C [facility]. [place] Office: [phone] Fax: [phone]

## 2019-06-02 ENCOUNTER — Other Ambulatory Visit: Payer: Self-pay | Admitting: Cardiology

## 2019-06-02 ENCOUNTER — Other Ambulatory Visit: Payer: Self-pay

## 2019-06-02 ENCOUNTER — Encounter (HOSPITAL_COMMUNITY)
Admission: RE | Admit: 2019-06-02 | Discharge: 2019-06-02 | Disposition: A | Discharge status: Home / Self Care | Payer: Self-pay | Source: Ambulatory Visit | Attending: Cardiology | Admitting: Cardiology

## 2019-06-02 NOTE — Progress Notes (Signed)
Patient has not be using the virtual cardiac rehab app to log exercise, so I contacted patient via telephone. Patient states she's deleted the app because she's been having problems with spasms, therefore she's not been exercising at this time. Patient has been seen by cardiology regarding recurrent anginal symptoms and is being managed medically at this time per patient. Patient has a follow-up appointment with cardiology on 06/23/2019.  I discussed the on-site cardiac rehab program with patient if her cardiologist approves after her office visit on 06/23/2019. Patient states that she's still working and is unsure if she is comfortable participating in the on-site cardiac rehab program because of the COVID-19 pandemic. I let patient know we will follow-up with her after her office visit, and patient is amenable to this.   Will discharge patient from the virtual cardiac rehab program at this time. Patient's virtual cardiac rehab report will be scanned to media for review.  Seward Carol, [place], ACSM Juanito Doom 06/02/2019 [phone]

## 2019-06-08 ENCOUNTER — Emergency Department (HOSPITAL_COMMUNITY)
Admission: EM | Admit: 2019-06-08 | Discharge: 2019-06-08 | Disposition: A | Discharge status: Home / Self Care | Payer: BC Managed Care – PPO | Attending: Emergency Medicine | Admitting: Emergency Medicine

## 2019-06-08 ENCOUNTER — Emergency Department (HOSPITAL_COMMUNITY): Payer: BC Managed Care – PPO

## 2019-06-08 DIAGNOSIS — R479 Unspecified speech disturbances: Secondary | ICD-10-CM | POA: Insufficient documentation

## 2019-06-08 DIAGNOSIS — J449 Chronic obstructive pulmonary disease, unspecified: Secondary | ICD-10-CM | POA: Insufficient documentation

## 2019-06-08 DIAGNOSIS — I2510 Atherosclerotic heart disease of native coronary artery without angina pectoris: Secondary | ICD-10-CM | POA: Insufficient documentation

## 2019-06-08 DIAGNOSIS — Z20828 Contact with and (suspected) exposure to other viral communicable diseases: Secondary | ICD-10-CM | POA: Insufficient documentation

## 2019-06-08 DIAGNOSIS — R0789 Other chest pain: Secondary | ICD-10-CM | POA: Diagnosis not present

## 2019-06-08 DIAGNOSIS — R2990 Unspecified symptoms and signs involving the nervous system: Secondary | ICD-10-CM

## 2019-06-08 DIAGNOSIS — R531 Weakness: Secondary | ICD-10-CM | POA: Diagnosis not present

## 2019-06-08 DIAGNOSIS — Z7982 Long term (current) use of aspirin: Secondary | ICD-10-CM | POA: Insufficient documentation

## 2019-06-08 DIAGNOSIS — I10 Essential (primary) hypertension: Secondary | ICD-10-CM | POA: Insufficient documentation

## 2019-06-08 DIAGNOSIS — Z79899 Other long term (current) drug therapy: Secondary | ICD-10-CM | POA: Insufficient documentation

## 2019-06-08 HISTORY — DX: Unspecified symptoms and signs involving the nervous system: R29.90

## 2019-06-08 LAB — TROPONIN I (HIGH SENSITIVITY)
Troponin I (High Sensitivity): <2 ng/L (ref <18)
Troponin I (High Sensitivity): <2 ng/L (ref <18)

## 2019-06-08 LAB — COMPREHENSIVE METABOLIC PANEL
ALT: 21 U/L (ref 0–44)
AST: 21 U/L (ref 15–41)
Albumin: 3.6 g/dL (ref 3.5–5.0)
Alkaline Phosphatase: 79 U/L (ref 38–126)
Anion gap: 11 (ref 5–15)
BUN: 21 mg/dL (ref 8–23)
CO2: 20 mmol/L — ABNORMAL LOW (ref 22–32)
Calcium: 8.9 mg/dL (ref 8.9–10.3)
Chloride: 107 mmol/L (ref 98–111)
Creatinine, Ser: 0.85 mg/dL (ref 0.44–1.00)
GFR calc Af Amer: >60 mL/min (ref >60)
GFR calc non Af Amer: >60 mL/min (ref >60)
Glucose, Bld: 76 mg/dL (ref 70–99)
Potassium: 4.5 mmol/L (ref 3.5–5.1)
Sodium: 138 mmol/L (ref 135–145)
Total Bilirubin: 1 mg/dL (ref 0.3–1.2)
Total Protein: 6.6 g/dL (ref 6.5–8.1)

## 2019-06-08 LAB — I-STAT CHEM 8, ED
BUN: 23 mg/dL (ref 8–23)
Calcium, Ion: 1.03 mmol/L — ABNORMAL LOW (ref 1.15–1.40)
Chloride: 106 mmol/L (ref 98–111)
Creatinine, Ser: 0.9 mg/dL (ref 0.44–1.00)
Glucose, Bld: 81 mg/dL (ref 70–99)
HCT: 39 % (ref 36.0–46.0)
Hemoglobin: 13.3 g/dL (ref 12.0–15.0)
Potassium: 4.3 mmol/L (ref 3.5–5.1)
Sodium: 137 mmol/L (ref 135–145)
TCO2: 25 mmol/L (ref 22–32)

## 2019-06-08 LAB — DIFFERENTIAL
Abs Immature Granulocytes: 0.02 10*3/uL (ref 0.00–0.07)
Basophils Absolute: 0.1 10*3/uL (ref 0.0–0.1)
Basophils Relative: 1 %
Eosinophils Absolute: 0.1 10*3/uL (ref 0.0–0.5)
Eosinophils Relative: 2 %
Immature Granulocytes: 0 %
Lymphocytes Relative: 17 %
Lymphs Abs: 1.2 10*3/uL (ref 0.7–4.0)
Monocytes Absolute: 0.6 10*3/uL (ref 0.1–1.0)
Monocytes Relative: 8 %
Neutro Abs: 4.9 10*3/uL (ref 1.7–7.7)
Neutrophils Relative %: 72 %

## 2019-06-08 LAB — CBC
HCT: 41 % (ref 36.0–46.0)
Hemoglobin: 13.6 g/dL (ref 12.0–15.0)
MCH: 33.6 pg (ref 26.0–34.0)
MCHC: 33.2 g/dL (ref 30.0–36.0)
MCV: 101.2 fL — ABNORMAL HIGH (ref 80.0–100.0)
Platelets: 245 10*3/uL (ref 150–400)
RBC: 4.05 MIL/uL (ref 3.87–5.11)
RDW: 13.3 % (ref 11.5–15.5)
WBC: 6.9 10*3/uL (ref 4.0–10.5)
nRBC: 0 % (ref 0.0–0.2)

## 2019-06-08 LAB — APTT: aPTT: 36 seconds (ref 24–36)

## 2019-06-08 LAB — PROTIME-INR
INR: 1.1 (ref 0.8–1.2)
Prothrombin Time: 13.7 seconds (ref 11.4–15.2)

## 2019-06-08 LAB — SARS CORONAVIRUS 2 BY RT PCR (HOSPITAL ORDER, PERFORMED IN ~~LOC~~ HOSPITAL LAB): SARS Coronavirus 2: NEGATIVE

## 2019-06-08 MED ORDER — SODIUM CHLORIDE 0.9% FLUSH: 3.0000 mL | Freq: Once | INTRAVENOUS | Status: DC

## 2019-06-08 NOTE — Code Documentation (Signed)
63yo female presenting to [facility] at 78 via [place]. Patient from work where she called EMS for right sided weakness and aphasia. EMS activated a code stroke. Per report, patient woke up at 0630 with chest pain. She then developed a headache. At 0700 she noted right face and arm numbness which progressed to right sided weakness. She went to work and developed difficulty speaking. Stroke team to the bedside on arrival. Labs drawn and patient cleared for CT by Dr. Stark Jock. Patient to CT with team. CT completed. NIHSS 4, see documentation for details and code stroke times. Patient missed the month (stated November), right arm and leg drift and subjective right sided decreased sensation on exam. Patient with questionable effort on exam and stuttering speech at times. Symptoms are too mild to treat with tPA, however, patient remains in the tPA window until 1130 should symptoms worsen. Patient to have MRI. Patient denies headache but continues to report chest pain. Bedside handoff with ED RN Jenny Reichmann.

## 2019-06-08 NOTE — ED Notes (Signed)
Pt transported to MRI 

## 2019-06-08 NOTE — ED Provider Notes (Signed)
[facility] EMERGENCY DEPARTMENT Provider Note   CSN: [id number] Arrival date & time: 06/08/19  1041     History   Chief Complaint No chief complaint on file.   HPI Tonya Boyd is a 63 y.o. female.     63 yo F with a chief complaint of right-sided weakness and change to her speech.  This started a couple hours ago.  Patient was having chest pain this morning which is typical of her chest pain.  Took a nitroglycerin and went to work and then realized that her right side was weaker than normal.  Also noticed that she was having trouble with her speech.  Eventually called 911.  Denies headache.  The history is provided by the patient.  Illness Severity:  Moderate Onset quality:  Sudden Duration:  2 hours Timing:  Constant Progression:  Worsening Chronicity:  New Associated symptoms: chest pain   Associated symptoms: no congestion, no fever, no headaches, no myalgias, no nausea, no rhinorrhea, no shortness of breath, no vomiting and no wheezing     Past Medical History:  Diagnosis Date  . Angina pectoris (HCC)   . Asthma    "as a child and as an adult" (08/02/2018)  . CAD (coronary artery disease), native coronary artery 08/03/2018  . Chronic bronchitis (HCC)   . Chronic kidney disease   . Coronary artery disease   . Family history of adverse reaction to anesthesia    "cousin stopped breathing; he was allergic to the anesthesia" (08/02/2018)  . GERD (gastroesophageal reflux disease)   . High cholesterol   . History of gout   . History of hiatal hernia   . History of kidney stones   . Hypertension 07/2018  . Interstitial cystitis   . NSVT (nonsustained ventricular tachycardia) (HCC) 07/2018  . Raynaud's disease    "feet and hands" (08/02/2018)  . Raynaud's disease without gangrene 10/15/2018    Patient Active Problem List   Diagnosis Date Noted  . Raynaud's disease without gangrene 10/15/2018  . Angina pectoris (HCC) 10/07/2018  . Essential  hypertension 08/03/2018  . CAD (coronary artery disease), native coronary artery 08/03/2018  . Post PTCA 08/02/2018  . Elevated IgE level 07/19/2018  . Dyspnea on exertion 07/19/2018  . Long-term exposure involving bird droppings 07/19/2018  . Iliotibial band syndrome of left side 10/21/2011    Past Surgical History:  Procedure Laterality Date  . ABDOMINAL HYSTERECTOMY    . ANTERIOR CERVICAL DECOMP/DISCECTOMY FUSION    . APPENDECTOMY    . AUGMENTATION MAMMAPLASTY Bilateral   . BACK SURGERY    . CORONARY ANGIOPLASTY WITH STENT PLACEMENT  08/02/2018  . CORONARY STENT INTERVENTION N/A 08/02/2018   Procedure: CORONARY STENT INTERVENTION;  Surgeon: [name], MD;  Location: [facility] INVASIVE CV LAB;  Service: Cardiovascular;  Laterality: N/A;  RCA   . CYSTOSCOPY W/ STONE MANIPULATION    . INTRAVASCULAR PRESSURE WIRE/FFR STUDY N/A 10/07/2018   Procedure: INTRAVASCULAR PRESSURE WIRE/FFR STUDY;  Surgeon: [name], MD;  Location: [facility] INVASIVE CV LAB;  Service: Cardiovascular;  Laterality: N/A;  . KNEE ARTHROSCOPY Right   . LAPAROSCOPIC CHOLECYSTECTOMY    . LEFT HEART CATH AND CORONARY ANGIOGRAPHY N/A 10/07/2018   Procedure: LEFT HEART CATH AND CORONARY ANGIOGRAPHY;  Surgeon: [name], MD;  Location: [facility] INVASIVE CV LAB;  Service: Cardiovascular;  Laterality: N/A;  . REPAIR ANKLE LIGAMENT Left    "tied up ligament; had tore up qthing in my ankle"  . RIGHT HEART CATH  10/07/2018  .  RIGHT/LEFT HEART CATH AND CORONARY ANGIOGRAPHY N/A 08/02/2018   Procedure: RIGHT/LEFT HEART CATH AND CORONARY ANGIOGRAPHY;  Surgeon: Adrian Prows, MD;  Location: [place] [facility];  Service: Cardiovascular;  Laterality: N/A;  . SHOULDER SURGERY     bilateral  . TONSILLECTOMY    . TUBAL LIGATION       OB History   No obstetric history on file.      Home Medications    Prior to Admission medications   Medication Sig Start Date End Date Taking? Authorizing Provider  aspirin EC 81 MG tablet Take 81 mg by mouth  daily.    [provider]  carvedilol (COREG) 6.25 MG tablet Take 1 tablet (6.25 mg total) by mouth 2 (two) times daily. 03/13/19   Adrian Prows, MD  cyclobenzaprine (FLEXERIL) 10 MG tablet Take 10 mg by mouth at bedtime.    [provider]  dexlansoprazole (DEXILANT) 60 MG capsule Take 60 mg by mouth daily.    [provider]  Evolocumab (REPATHA) 140 MG/ML SOSY Inject 140 mg into the skin every 14 (fourteen) days. 03/03/19   Adrian Prows, MD  isosorbide mononitrate (IMDUR) 30 MG 24 hr tablet Take 1 tablet (30 mg total) by mouth 2 (two) times daily. 1/2 after waking in am and 2-3 hours later 05/31/19 06/30/19  Miquel Dunn, NP  labetalol (NORMODYNE) 100 MG tablet TAKE ONE TABLET BY MOUTH TWICE A DAY Patient taking differently: Take 100 mg by mouth daily at 2 PM.  04/26/19   Adrian Prows, MD  nitroGLYCERIN (NITROSTAT) 0.4 MG SL tablet Place 1 tablet (0.4 mg total) under the tongue every 5 (five) minutes as needed for chest pain. 08/03/18   Patwardhan, Reynold Bowen, MD  Nutritional Supplements (ARGINAID) PACK Take 1 packet by mouth daily. 01/25/19   Adrian Prows, MD  oxybutynin (DITROPAN) 5 MG tablet Take 2.5 mg by mouth at bedtime.  05/02/18   [provider]  pantoprazole (PROTONIX) 20 MG tablet TAKE 1 TABLET BY MOUTH ONCE A DAY. 05/26/19   Adrian Prows, MD  ranolazine (RANEXA) 500 MG 12 hr tablet Take 1 tablet (500 mg total) by mouth 2 (two) times daily. 05/31/19   Miquel Dunn, NP  triazolam (HALCION) 0.125 MG tablet Take 0.125 mg by mouth at bedtime.    [provider]  verapamil (CALAN-SR) 120 MG CR tablet TAKE 1 TABLET BY MOUTH ONCE A DAY. 06/02/19   Adrian Prows, MD  Vitamin D, Ergocalciferol, (DRISDOL) 1.25 MG (50000 UT) CAPS capsule Take 50,000 Units by mouth every 7 (seven) days. Tuesdays    [provider]    Family History Family History  Problem Relation Age of Onset  . Heart disease Other   . Arthritis Other   . Cancer Other   .  Asthma Other   . Diabetes Other   . Kidney disease Other   . Kidney failure Mother   . Diabetes Mother   . Heart disease Father   . Heart attack Father     Social History Social History   Tobacco Use  . Smoking status: Never Smoker  . Smokeless tobacco: Never Used  Substance Use Topics  . Alcohol use: Not Currently    Comment: 08/02/2018 "not since my 20's"  . Drug use: Never     Allergies   Statins, Acetaminophen, Carvedilol, Diovan [valsartan], Hydrochlorothiazide, Ibuprofen, Keflex [cephalexin], Metoclopramide hcl, Montelukast sodium, Morphine and related, Norvasc [amlodipine besylate], Nsaids, Oxycodone-acetaminophen, Plavix [clopidogrel bisulfate], Prednisone, Reglan [metoclopramide], and Singulair [montelukast]  Review of Systems Review of Systems  Constitutional: Negative for chills and fever.  HENT: Negative for congestion and rhinorrhea.   Eyes: Negative for redness and visual disturbance.  Respiratory: Negative for shortness of breath and wheezing.   Cardiovascular: Positive for chest pain. Negative for palpitations.  Gastrointestinal: Negative for nausea and vomiting.  Genitourinary: Negative for dysuria and urgency.  Musculoskeletal: Negative for arthralgias and myalgias.  Skin: Negative for pallor and wound.  Neurological: Positive for weakness. Negative for dizziness and headaches.     Physical Exam Updated Vital Signs Wt 85.3 kg   BMI 31.78 kg/m   Physical Exam Vitals signs and nursing note reviewed.  Constitutional:      General: She is not in acute distress.    Appearance: She is well-developed. She is not diaphoretic.  HENT:     Head: Normocephalic and atraumatic.  Eyes:     Pupils: Pupils are equal, round, and reactive to light.  Neck:     Musculoskeletal: Normal range of motion and neck supple.  Cardiovascular:     Rate and Rhythm: Normal rate and regular rhythm.     Heart sounds: No murmur. No friction rub. No gallop.   Pulmonary:      Effort: Pulmonary effort is normal.     Breath sounds: No wheezing or rales.  Abdominal:     General: There is no distension.     Palpations: Abdomen is soft.     Tenderness: There is no abdominal tenderness.  Musculoskeletal:        General: No tenderness.  Skin:    General: Skin is warm and dry.  Neurological:     Mental Status: She is alert and oriented to person, place, and time.     Motor: Weakness present.     Comments: Stuttering speech, some inapropriate words with talking  Psychiatric:        Behavior: Behavior normal.      ED Treatments / Results  Labs (all labs ordered are listed, but only abnormal results are displayed) Labs Reviewed  SARS CORONAVIRUS 2 (HOSPITAL ORDER, PERFORMED IN [facility])  PROTIME-INR  APTT  CBC  DIFFERENTIAL  COMPREHENSIVE METABOLIC PANEL  I-STAT CHEM 8, ED  CBG MONITORING, ED  TROPONIN I (HIGH SENSITIVITY)    EKG None  Radiology No results found.  Procedures Procedures (including critical care time)  Medications Ordered in ED Medications  sodium chloride flush (NS) 0.9 % injection 3 mL (has no administration in time range)     Initial Impression / Assessment and Plan / ED Course  I have reviewed the triage vital signs and the nursing notes.  Pertinent labs & imaging results that were available during my care of the patient were reviewed by me and considered in my medical decision making (see chart for details).        63 yo F here as a code stroke with right-sided weakness and stuttering speech.  Seen at the bridge and protecting her airway.  Sent emergently back to CT.  CT without acute finding.  Neurology feels this is less likely to be a stroke.  Think this could be a complicated migraine.  Recommend MRI to rule out stroke.  Patient CARE signed out to Dr. Rodena MedinMessick, please see his note for further details of care in the ED.   The patients results and plan were reviewed and discussed.   Any x-rays  performed were independently reviewed by myself.   Differential diagnosis were considered with  the presenting HPI.  Medications - No data to display  Vitals:   06/08/19 1715 06/08/19 1730 06/08/19 1745 06/08/19 1800  BP: 132/89 140/78 (!) 143/94 (!) 152/81  Pulse: 77 78 83 80  Resp: Temp:      TempSrc:      SpO2: 98% 97% 99% 97%  Weight:      Height:        Final diagnoses:  Weakness    Admission/ observation were discussed with the admitting physician, patient and/or family and they are comfortable with the plan.      Final Clinical Impressions(s) / ED Diagnoses   Final diagnoses:  None    ED Discharge Orders    None       Melene Plan, DO 06/09/19 [phone]

## 2019-06-08 NOTE — Discharge Instructions (Signed)
Please return for any problem.  Follow-up with your regular care providers as instructed.  Follow-up with neurology in the outpatient setting as instructed.

## 2019-06-08 NOTE — ED Provider Notes (Signed)
Patient seen after prior ED provider.  MRI did not demonstrate significant acute abnormality.  Patient feels improved.  She and her husband understand the need for close outpatient follow-up both with her PMD and also with neurology.  Importance of close follow-up is stressed.  Strict return precautions given and understood.       Valarie Merino, MD 06/08/19 [phone]

## 2019-06-08 NOTE — Consult Note (Addendum)
Neurology Consultation  Reason for Consult: Code stroke Referring Physician: Adela Lank  CC: Headache, right-sided numbness, right-sided weakness, difficulty with speech  History is obtained from: Patient  HPI: Tonya Boyd is a 63 y.o. female with history of ray nods disease, SVT, hypertension, kidney stones, gout, hypercholesterolemia, CAD and chronic bronchitis.  Patient states that she will get 630 this morning and was feeling normal.  At approximately 7:30 AM she noted that the right aspect of her face felt tingly which then went down the right arm and right leg.  This was associated with headache and chest pain.  Patient states that she does not get many headaches however in the past she has had headaches.  Patient was brought to CT scan to evaluate for stroke.  CT was negative.    LKW: 0700 on 06/08/2019 tpa given?: no, minimal symptoms Premorbid modified Rankin scale (mRS): 0 NIH stroke score: 4   ROS: A 14 point ROS was performed and is negative except as noted in the HPI.   Past Medical History:  Diagnosis Date  . Angina pectoris (HCC)   . Asthma    "as a child and as an adult" (08/02/2018)  . CAD (coronary artery disease), native coronary artery 08/03/2018  . Chronic bronchitis (HCC)   . Chronic kidney disease   . Coronary artery disease   . Family history of adverse reaction to anesthesia    "cousin stopped breathing; he was allergic to the anesthesia" (08/02/2018)  . GERD (gastroesophageal reflux disease)   . High cholesterol   . History of gout   . History of hiatal hernia   . History of kidney stones   . Hypertension 07/2018  . Interstitial cystitis   . NSVT (nonsustained ventricular tachycardia) (HCC) 07/2018  . Raynaud's disease    "feet and hands" (08/02/2018)  . Raynaud's disease without gangrene 10/15/2018     Family History  Problem Relation Age of Onset  . Heart disease Other   . Arthritis Other   . Cancer Other   . Asthma Other   . Diabetes Other    . Kidney disease Other   . Kidney failure Mother   . Diabetes Mother   . Heart disease Father   . Heart attack Father     Social History:   reports that she has never smoked. She has never used smokeless tobacco. She reports previous alcohol use. She reports that she does not use drugs.  Medications  Current Facility-Administered Medications:  .  Evolocumab SOSY 140 mg, 140 mg, Subcutaneous, Once, [name], MD .  Evolocumab SOSY 140 mg, 140 mg, Subcutaneous, Once, Toniann Fail, NP .  sodium chloride flush (NS) 0.9 % injection 3 mL, 3 mL, Intravenous, Once, Melene Plan, DO  Current Outpatient Medications:  .  aspirin EC 81 MG tablet, Take 81 mg by mouth daily., Disp: , Rfl:  .  carvedilol (COREG) 6.25 MG tablet, Take 1 tablet (6.25 mg total) by mouth 2 (two) times daily., Disp: 60 tablet, Rfl: 3 .  cyclobenzaprine (FLEXERIL) 10 MG tablet, Take 10 mg by mouth at bedtime., Disp: , Rfl:  .  dexlansoprazole (DEXILANT) 60 MG capsule, Take 60 mg by mouth daily., Disp: , Rfl:  .  Evolocumab (REPATHA) 140 MG/ML SOSY, Inject 140 mg into the skin every 14 (fourteen) days., Disp: 140 mL, Rfl: 4 .  isosorbide mononitrate (IMDUR) 30 MG 24 hr tablet, Take 1 tablet (30 mg total) by mouth 2 (two) times daily. 1/2 after waking  in am and 2-3 hours later, Disp: 60 tablet, Rfl: 3 .  labetalol (NORMODYNE) 100 MG tablet, TAKE ONE TABLET BY MOUTH TWICE A DAY (Patient taking differently: Take 100 mg by mouth daily at 2 PM. ), Disp: 180 tablet, Rfl: 0 .  nitroGLYCERIN (NITROSTAT) 0.4 MG SL tablet, Place 1 tablet (0.4 mg total) under the tongue every 5 (five) minutes as needed for chest pain., Disp: 30 tablet, Rfl: 2 .  Nutritional Supplements (ARGINAID) PACK, Take 1 packet by mouth daily., Disp: 30 each, Rfl: 1 .  oxybutynin (DITROPAN) 5 MG tablet, Take 2.5 mg by mouth at bedtime. , Disp: , Rfl: 1 .  pantoprazole (PROTONIX) 20 MG tablet, TAKE 1 TABLET BY MOUTH ONCE A DAY., Disp: 90 tablet, Rfl: 0 .   ranolazine (RANEXA) 500 MG 12 hr tablet, Take 1 tablet (500 mg total) by mouth 2 (two) times daily., Disp: 60 tablet, Rfl: 3 .  triazolam (HALCION) 0.125 MG tablet, Take 0.125 mg by mouth at bedtime., Disp: , Rfl:  .  verapamil (CALAN-SR) 120 MG CR tablet, TAKE 1 TABLET BY MOUTH ONCE A DAY., Disp: 30 tablet, Rfl: 0 .  Vitamin D, Ergocalciferol, (DRISDOL) 1.25 MG (50000 UT) CAPS capsule, Take 50,000 Units by mouth every 7 (seven) days. Tuesdays, Disp: , Rfl:    Exam: Current vital signs: Ht 5' 4.5" (1.638 m)   Wt 85.3 kg   BMI 31.78 kg/m  Vital signs in last 24 hours: Weight:  [85.3 kg] 85.3 kg (10/01 1101)  Physical Exam  Constitutional: Appears well-developed and well-nourished.  Psych: Affect appropriate to situation Eyes: No scleral injection HENT: No OP obstrucion Head: Normocephalic.  Cardiovascular: Normal rate and regular rhythm.  Respiratory: Effort normal, non-labored breathing GI: Soft.  No distension. There is no tenderness.  Skin: WDI  Neuro: Mental Status: Patient is awake, alert, believes it is November, did not get her age correct Patient is able to give a clear and coherent history. No signs of aphasia or neglect Cranial Nerves: II: Visual Fields are full.  III,IV, VI: EOMI without ptosis or diploplia. Pupils equal, round and reactive to light V: Facial sensation is symmetric to temperature VII: Facial movement is symmetric.  VIII: hearing is intact to voice X: Palat elevates symmetrically XI: Shoulder shrug is symmetric. XII: tongue is midline without atrophy or fasciculations.  Motor: Tone is normal. Bulk is normal. 5/5 strength was present on left arm and leg.  Right arm showed 4/5 strength with significant give way.  Right leg showed 4/5 strength again with significant giveaway.  Sensory: Decreased sensation on the right face arm and leg Deep Tendon Reflexes: 2+ and symmetric in the biceps and patellae.  Plantars: Toes are downgoing bilaterally.   Cerebellar: Patient does finger-nose-finger on the right abnormally but it is distractible same goes for heel to shin on the right.  On the left side finger-nose-finger and heel-to-shin was normal  Labs I have reviewed labs in epic and the results pertinent to this consultation are:   CBC    Component Value Date/Time   WBC 6.1 10/07/2018 1347   RBC 3.92 10/07/2018 1347   HGB 13.3 06/08/2019 1058   HCT 39.0 06/08/2019 1058   PLT 232 10/07/2018 1347   MCV 101.0 (H) 10/07/2018 1347   MCH 31.9 10/07/2018 1347   MCHC 31.6 10/07/2018 1347   RDW 12.6 10/07/2018 1347   LYMPHSABS 1.2 07/08/2018 1044   MONOABS 0.6 07/08/2018 1044   EOSABS 0.1 07/08/2018 1044   BASOSABS  0.1 07/08/2018 1044    CMP     Component Value Date/Time   NA 137 06/08/2019 1058   K 4.3 06/08/2019 1058   CL 106 06/08/2019 1058   CO2 20 (L) 10/07/2018 1347   GLUCOSE 81 06/08/2019 1058   BUN 23 06/08/2019 1058   CREATININE 0.90 06/08/2019 1058   CALCIUM 9.3 10/07/2018 1347   GFRNONAA >60 10/07/2018 1347   GFRAA >60 10/07/2018 1347    Lipid Panel  No results found for: CHOL, TRIG, HDL, CHOLHDL, VLDL, LDLCALC, LDLDIRECT   Imaging I have reviewed the images obtained:  CT-scan of the brain-no acute findings within aspects of 10  MRI examination of the brain-pending  Felicie Mornavid Smith PA-C [facility] [phone]  M-F  (9:00 am- 5:00 PM)  06/08/2019, 11:05 AM   I have seen and evaluated the patient and reviewed the above note    Assessment: 63 year old female presenting to the hospital as a code stroke.  Initial symptoms described as difficulty expressing herself, decreased sensation on the right face, arm, leg.  Exam shows multiple functional symptoms which are distractible.  Given the fact that the symptoms progressed from head to right leg and associate with headache could suggest complicated migraine with embelishment.  Will obtain MRI to rule out possible stroke but this is less likely.  In any case, the observable deficits that are not distractible are mild and would therefore not favor tPA.    Recommendations: -MRI brain -If MRI is negative no further neurological work-up at this time   Ritta SlotMcNeill Orean Giarratano, MD [facility] [phone]  If 7pm- 7am, please page neurology on call as listed in AMION.

## 2019-06-08 NOTE — ED Notes (Signed)
Updated pt, MRI stated pt is now 2nd on the schedule to come over.

## 2019-06-08 NOTE — ED Triage Notes (Signed)
Per EMS: pt woke up with chest pain that the pt states has been going on for a couple of weeks. Pt states cardiologist told pt she was heart spasms.  Pt noticed around 0700 this am her right arm began tingling, pt proceeded on to work and around 1000 pt noticed the tingling and numbness traveling down to her right leg.  Pt also endorses mild aphasia prior to arrival.   EMS vitals: BP 163/105 HR 88 RR 18 Sp02 98% RA CBG 117

## 2019-06-08 NOTE — ED Notes (Signed)
Patient verbalizes understanding of discharge instructions. Opportunity for questioning and answers were provided. Armband removed by staff, pt discharged from ED.  

## 2019-06-13 ENCOUNTER — Encounter (HOSPITAL_COMMUNITY): Payer: Self-pay | Admitting: Emergency Medicine

## 2019-06-13 ENCOUNTER — Emergency Department (HOSPITAL_COMMUNITY): Payer: BC Managed Care – PPO

## 2019-06-13 ENCOUNTER — Emergency Department (HOSPITAL_COMMUNITY)
Admission: EM | Admit: 2019-06-13 | Discharge: 2019-06-13 | Disposition: A | Discharge status: Home / Self Care | Payer: BC Managed Care – PPO | Attending: Emergency Medicine | Admitting: Emergency Medicine

## 2019-06-13 ENCOUNTER — Telehealth: Payer: Self-pay | Admitting: Neurology

## 2019-06-13 ENCOUNTER — Other Ambulatory Visit: Payer: Self-pay

## 2019-06-13 DIAGNOSIS — Z20828 Contact with and (suspected) exposure to other viral communicable diseases: Secondary | ICD-10-CM | POA: Insufficient documentation

## 2019-06-13 DIAGNOSIS — I25119 Atherosclerotic heart disease of native coronary artery with unspecified angina pectoris: Secondary | ICD-10-CM | POA: Insufficient documentation

## 2019-06-13 DIAGNOSIS — I10 Essential (primary) hypertension: Secondary | ICD-10-CM | POA: Insufficient documentation

## 2019-06-13 DIAGNOSIS — Z79899 Other long term (current) drug therapy: Secondary | ICD-10-CM | POA: Diagnosis not present

## 2019-06-13 DIAGNOSIS — Z7982 Long term (current) use of aspirin: Secondary | ICD-10-CM | POA: Insufficient documentation

## 2019-06-13 DIAGNOSIS — R471 Dysarthria and anarthria: Secondary | ICD-10-CM

## 2019-06-13 DIAGNOSIS — R531 Weakness: Secondary | ICD-10-CM

## 2019-06-13 DIAGNOSIS — R519 Headache, unspecified: Secondary | ICD-10-CM | POA: Diagnosis present

## 2019-06-13 DIAGNOSIS — Z955 Presence of coronary angioplasty implant and graft: Secondary | ICD-10-CM | POA: Diagnosis not present

## 2019-06-13 LAB — CBC
HCT: 46.5 % — ABNORMAL HIGH (ref 36.0–46.0)
Hemoglobin: 15 g/dL (ref 12.0–15.0)
MCH: 32.6 pg (ref 26.0–34.0)
MCHC: 32.3 g/dL (ref 30.0–36.0)
MCV: 101.1 fL — ABNORMAL HIGH (ref 80.0–100.0)
Platelets: 187 10*3/uL (ref 150–400)
RBC: 4.6 MIL/uL (ref 3.87–5.11)
RDW: 13.2 % (ref 11.5–15.5)
WBC: 6.9 10*3/uL (ref 4.0–10.5)
nRBC: 0 % (ref 0.0–0.2)

## 2019-06-13 LAB — DIFFERENTIAL
Abs Immature Granulocytes: 0.03 10*3/uL (ref 0.00–0.07)
Basophils Absolute: 0.1 10*3/uL (ref 0.0–0.1)
Basophils Relative: 1 %
Eosinophils Absolute: 0.1 10*3/uL (ref 0.0–0.5)
Eosinophils Relative: 2 %
Immature Granulocytes: 0 %
Lymphocytes Relative: 19 %
Lymphs Abs: 1.3 10*3/uL (ref 0.7–4.0)
Monocytes Absolute: 0.5 10*3/uL (ref 0.1–1.0)
Monocytes Relative: 7 %
Neutro Abs: 4.9 10*3/uL (ref 1.7–7.7)
Neutrophils Relative %: 71 %

## 2019-06-13 LAB — BASIC METABOLIC PANEL
Anion gap: 12 (ref 5–15)
BUN: 15 mg/dL (ref 8–23)
CO2: 26 mmol/L (ref 22–32)
Calcium: 9.2 mg/dL (ref 8.9–10.3)
Chloride: 104 mmol/L (ref 98–111)
Creatinine, Ser: 0.99 mg/dL (ref 0.44–1.00)
GFR calc Af Amer: >60 mL/min (ref >60)
GFR calc non Af Amer: >60 mL/min (ref >60)
Glucose, Bld: 81 mg/dL (ref 70–99)
Potassium: 4 mmol/L (ref 3.5–5.1)
Sodium: 142 mmol/L (ref 135–145)

## 2019-06-13 LAB — RAPID URINE DRUG SCREEN, HOSP PERFORMED
Amphetamines: NOT DETECTED
Barbiturates: NOT DETECTED
Benzodiazepines: NOT DETECTED
Cocaine: NOT DETECTED
Opiates: NOT DETECTED
Tetrahydrocannabinol: NOT DETECTED

## 2019-06-13 LAB — URINALYSIS, ROUTINE W REFLEX MICROSCOPIC
Bacteria, UA: NONE SEEN
Bilirubin Urine: NEGATIVE
Glucose, UA: NEGATIVE mg/dL
Ketones, ur: NEGATIVE mg/dL
Leukocytes,Ua: NEGATIVE
Nitrite: NEGATIVE
Protein, ur: NEGATIVE mg/dL
Specific Gravity, Urine: 1.005 (ref 1.005–1.030)
pH: 5 (ref 5.0–8.0)

## 2019-06-13 LAB — COMPREHENSIVE METABOLIC PANEL
ALT: 24 U/L (ref 0–44)
AST: 36 U/L (ref 15–41)
Albumin: 4.1 g/dL (ref 3.5–5.0)
Alkaline Phosphatase: 85 U/L (ref 38–126)
Anion gap: 13 (ref 5–15)
BUN: 18 mg/dL (ref 8–23)
CO2: 23 mmol/L (ref 22–32)
Calcium: 9.5 mg/dL (ref 8.9–10.3)
Chloride: 103 mmol/L (ref 98–111)
Creatinine, Ser: 1.04 mg/dL — ABNORMAL HIGH (ref 0.44–1.00)
GFR calc Af Amer: >60 mL/min (ref >60)
GFR calc non Af Amer: 58 mL/min — ABNORMAL LOW (ref >60)
Glucose, Bld: 78 mg/dL (ref 70–99)
Potassium: 5.7 mmol/L — ABNORMAL HIGH (ref 3.5–5.1)
Sodium: 139 mmol/L (ref 135–145)
Total Bilirubin: 1.3 mg/dL — ABNORMAL HIGH (ref 0.3–1.2)
Total Protein: 7.1 g/dL (ref 6.5–8.1)

## 2019-06-13 LAB — SARS CORONAVIRUS 2 BY RT PCR (HOSPITAL ORDER, PERFORMED IN ~~LOC~~ HOSPITAL LAB): SARS Coronavirus 2: NEGATIVE

## 2019-06-13 LAB — PROTIME-INR
INR: 1.1 (ref 0.8–1.2)
Prothrombin Time: 13.6 seconds (ref 11.4–15.2)

## 2019-06-13 LAB — ETHANOL: Alcohol, Ethyl (B): <10 mg/dL (ref <10)

## 2019-06-13 LAB — APTT: aPTT: 38 seconds — ABNORMAL HIGH (ref 24–36)

## 2019-06-13 MED ORDER — INSULIN ASPART 100 UNIT/ML IV SOLN: 5.0000 [IU] | Freq: Once | INTRAVENOUS | Status: DC

## 2019-06-13 MED ORDER — SODIUM CHLORIDE 0.9% FLUSH: 10.0000 mL | Freq: Two times a day (BID) | INTRAVENOUS | Status: DC

## 2019-06-13 MED ORDER — SODIUM CHLORIDE 0.9 % IV SOLN: 1.0000 g | Freq: Once | INTRAVENOUS | Status: DC

## 2019-06-13 MED ORDER — GADOBUTROL 1 MMOL/ML IV SOLN
8.0000 mL | Freq: Once | INTRAVENOUS | Status: AC | PRN
  Administered 2019-06-13: 8 mL via INTRAVENOUS

## 2019-06-13 MED ORDER — SODIUM CHLORIDE 0.9% FLUSH: 10.0000 mL | INTRAVENOUS | Status: DC | PRN

## 2019-06-13 MED ORDER — DEXTROSE 50 % IV SOLN: 1.0000 | Freq: Once | INTRAVENOUS | Status: DC

## 2019-06-13 NOTE — ED Notes (Addendum)
IV team at bedside 

## 2019-06-13 NOTE — ED Notes (Signed)
Called [facility] to inform them that patient is ready.

## 2019-06-13 NOTE — ED Provider Notes (Signed)
[facility] EMERGENCY [facility]PARTMENT [facility]   CSN: [id number] Arrival date & time: 06/13/19  1240     History   Chief Complaint No chief complaint on file.   HPI Tonya Boyd is a 63 y.o. female.     HPI The patient is here today after seeing a neurologist and her PCP today, for further evaluation of stroke.  She was seen in the ED, on 06/08/2019, at which time she was evaluated for stuttering speech, and possible stroke with CT and MRI imaging.  No acute abnormalities were found and she was recommended to follow-up with her PCP.  She saw a "headache doctor today," who recommended that she get further evaluation.  She subsequently went to her PCP who agreed with this, so she decided to come here.  She states that in addition to slurred speech she has weakness of her right arm and leg, and has some difficulty walking.  She denies headache at this time.  She has had intermittent headaches in the past.  She denies fever, chills, cough, shortness of breath, known sick exposures or other recent illnesses.  There are no other known modifying factors.   Past Medical History:  Diagnosis Date  . Angina pectoris (HCC)   . Asthma    "as a child and as an adult" (08/02/2018)  . CAD (coronary artery disease), native coronary artery 08/03/2018  . Chronic bronchitis (HCC)   . Chronic kidney disease   . Coronary artery disease   . Family history of adverse reaction to anesthesia    "cousin stopped breathing; he was allergic to the anesthesia" (08/02/2018)  . GERD (gastroesophageal reflux disease)   . High cholesterol   . History of gout   . History of hiatal hernia   . History of kidney stones   . Hypertension 07/2018  . Interstitial cystitis   . NSVT (nonsustained ventricular tachycardia) (HCC) 07/2018  . Raynaud's disease    "feet and hands" (08/02/2018)  . Raynaud's disease without gangrene 10/15/2018    Patient Active Problem List   Diagnosis Date Noted  .  Raynaud's disease without gangrene 10/15/2018  . Angina pectoris (HCC) 10/07/2018  . Essential hypertension 08/03/2018  . CAD (coronary artery disease), native coronary artery 08/03/2018  . Post PTCA 08/02/2018  . Elevated IgE level 07/19/2018  . Dyspnea on exertion 07/19/2018  . Long-term exposure involving bird droppings 07/19/2018  . Iliotibial band syndrome of left side 10/21/2011    Past Surgical History:  Procedure Laterality Date  . ABDOMINAL HYSTERECTOMY    . ANTERIOR CERVICAL DECOMP/DISCECTOMY FUSION    . APPENDECTOMY    . AUGMENTATION MAMMAPLASTY Bilateral   . BACK SURGERY    . CORONARY ANGIOPLASTY WITH STENT PLACEMENT  08/02/2018  . CORONARY STENT INTERVENTION N/A 08/02/2018   Procedure: CORONARY STENT INTERVENTION;  Surgeon: Yates DecampGanji, Jay, MD;  Location: [facility] INVASIVE CV LAB;  Service: Cardiovascular;  Laterality: N/A;  RCA   . CYSTOSCOPY W/ STONE MANIPULATION    . INTRAVASCULAR PRESSURE WIRE/FFR STUDY N/A 10/07/2018   Procedure: INTRAVASCULAR PRESSURE WIRE/FFR STUDY;  Surgeon: Yates DecampGanji, Jay, MD;  Location: [facility] INVASIVE CV LAB;  Service: Cardiovascular;  Laterality: N/A;  . KNEE ARTHROSCOPY Right   . LAPAROSCOPIC CHOLECYSTECTOMY    . LEFT HEART CATH AND CORONARY ANGIOGRAPHY N/A 10/07/2018   Procedure: LEFT HEART CATH AND CORONARY ANGIOGRAPHY;  Surgeon: Yates DecampGanji, Jay, MD;  Location: [facility] INVASIVE CV LAB;  Service: Cardiovascular;  Laterality: N/A;  . REPAIR ANKLE LIGAMENT Left    "  tied up ligament; had tore up qthing in my ankle"  . RIGHT HEART CATH  10/07/2018  . RIGHT/LEFT HEART CATH AND CORONARY ANGIOGRAPHY N/A 08/02/2018   Procedure: RIGHT/LEFT HEART CATH AND CORONARY ANGIOGRAPHY;  Surgeon: [name], MD;  Location: [facility] INVASIVE CV LAB;  Service: Cardiovascular;  Laterality: N/A;  . SHOULDER SURGERY     bilateral  . TONSILLECTOMY    . TUBAL LIGATION       OB History   No obstetric history on file.      Home Medications    Prior to Admission medications   Medication Sig  Start Date End Date Taking? Authorizing Provider  aspirin EC 81 MG tablet Take 81 mg by mouth daily.   Yes [provider]  cyclobenzaprine (FLEXERIL) 10 MG tablet Take 10 mg by mouth at bedtime.   Yes [provider]  dexlansoprazole (DEXILANT) 60 MG capsule Take 60 mg by mouth daily.   Yes [provider]  Evolocumab (REPATHA) 140 MG/ML SOSY Inject 140 mg into the skin every 14 (fourteen) days. 03/03/19  Yes [name], MD  isosorbide mononitrate (IMDUR) 30 MG 24 hr tablet Take 1 tablet (30 mg total) by mouth 2 (two) times daily. 1/2 after waking in am and 2-3 hours later 05/31/19 06/30/19 Yes Toniann Fail, NP  labetalol (NORMODYNE) 100 MG tablet TAKE ONE TABLET BY MOUTH TWICE A DAY Patient taking differently: Take 50 mg by mouth 2 (two) times daily.  04/26/19  Yes [name], MD  nitroGLYCERIN (NITROSTAT) 0.4 MG SL tablet Place 1 tablet (0.4 mg total) under the tongue every 5 (five) minutes as needed for chest pain. 08/03/18  Yes Patwardhan, Anabel Bene, MD  Nutritional Supplements (ARGINAID) PACK Take 1 packet by mouth daily. 01/25/19  Yes [name], MD  oxybutynin (DITROPAN) 5 MG tablet Take 2.5 mg by mouth at bedtime.  05/02/18  Yes [provider]  pantoprazole (PROTONIX) 20 MG tablet TAKE 1 TABLET BY MOUTH ONCE A DAY. Patient taking differently: Take 20 mg by mouth daily before breakfast.  05/26/19  Yes [name], MD  ranolazine (RANEXA) 500 MG 12 hr tablet Take 1 tablet (500 mg total) by mouth 2 (two) times daily. 05/31/19  Yes Toniann Fail, NP  triazolam (HALCION) 0.125 MG tablet Take 0.125 mg by mouth at bedtime.   Yes [provider]  verapamil (CALAN-SR) 120 MG CR tablet TAKE 1 TABLET BY MOUTH ONCE A DAY. Patient taking differently: Take 120 mg by mouth at bedtime.  06/02/19  Yes [name], MD  Vitamin D, Ergocalciferol, (DRISDOL) 1.25 MG (50000 UT) CAPS capsule Take 50,000 Units by mouth every 7 (seven) days. Tuesdays   Yes  [provider]    Family History Family History  Problem Relation Age of Onset  . Heart disease Other   . Arthritis Other   . Cancer Other   . Asthma Other   . Diabetes Other   . Kidney disease Other   . Kidney failure Mother   . Diabetes Mother   . Heart disease Father   . Heart attack Father     Social History Social History   Tobacco Use  . Smoking status: Never Smoker  . Smokeless tobacco: Never Used  Substance Use Topics  . Alcohol use: Not Currently    Comment: 08/02/2018 "not since my 20's"  . Drug use: Never     Allergies   Adhesive [tape], Statins, Carvedilol, Diovan [valsartan], Hydrochlorothiazide, Ibuprofen, Keflex [cephalexin], Metoclopramide hcl,  Montelukast sodium, Morphine and related, Norvasc [amlodipine besylate], Nsaids, Oxycodone, Oxycodone-acetaminophen, Paracetamol [acetaminophen], Plavix [clopidogrel bisulfate], Prednisone, Reglan [metoclopramide], and Singulair [montelukast]   Review of Systems Review of Systems  All other systems reviewed and are negative.    Physical Exam Updated Vital Signs BP (!) 200/110 (BP Location: Left Arm)   Pulse 73   Temp (!) 97.4 F (36.3 C) (Oral)   Resp 14   Ht 5' 4.5" (1.638 m)   Wt 85.7 kg   SpO2 99%   BMI 31.94 kg/m   Physical Exam Vitals signs and nursing note reviewed.  Constitutional:      Appearance: She is well-developed.  HENT:     Head: Normocephalic and atraumatic.     Right Ear: External ear normal.     Left Ear: External ear normal.  Eyes:     Conjunctiva/sclera: Conjunctivae normal.     Pupils: Pupils are equal, round, and reactive to light.  Neck:     Musculoskeletal: Normal range of motion and neck supple.     Trachea: Phonation normal.  Cardiovascular:     Rate and Rhythm: Normal rate and regular rhythm.     Heart sounds: Normal heart sounds.  Pulmonary:     Effort: Pulmonary effort is normal.     Breath sounds: Normal breath sounds.  Abdominal:     Palpations:  Abdomen is soft.     Tenderness: There is no abdominal tenderness.  Musculoskeletal: Normal range of motion.  Skin:    General: Skin is warm and dry.  Neurological:     Mental Status: She is alert and oriented to person, place, and time.     Cranial Nerves: No cranial nerve deficit.     Sensory: No sensory deficit.     Motor: No abnormal muscle tone.     Coordination: Coordination normal.     Comments: Dysarthria is present.  No a aphasia or nystagmus.  Strength testing arms and legs is somewhat equivocal.  When asked to hold her arms out in front of her, she raises them both to an equal level then drops her right arm about 12 inches, where she holds it suspended in the air.  Left leg elevates normally.  Left arm elevates normally.  Right leg elevates but then gradually drifts down somewhat whereupon she lowers it to the bed.  Psychiatric:        Mood and Affect: Mood normal.        Behavior: Behavior normal.        Thought Content: Thought content normal.        Judgment: Judgment normal.      ED Treatments / Results  Labs (all labs ordered are listed, but only abnormal results are displayed) Labs Reviewed  CBC - Abnormal; Notable for the following components:      Result Value   HCT 46.5 (*)    MCV 101.1 (*)    All other components within normal limits  COMPREHENSIVE METABOLIC PANEL - Abnormal; Notable for the following components:   Potassium 5.7 (*)    Creatinine, Ser 1.04 (*)    Total Bilirubin 1.3 (*)    GFR calc non Af Amer 58 (*)    All other components within normal limits  URINALYSIS, ROUTINE W REFLEX MICROSCOPIC - Abnormal; Notable for the following components:   Color, Urine STRAW (*)    Hgb urine dipstick SMALL (*)    All other components within normal limits  APTT - Abnormal; Notable for the  following components:   aPTT 38 (*)    All other components within normal limits  I-STAT CHEM 8, ED - Abnormal; Notable for the following components:   Potassium 7.3 (*)     BUN 26 (*)    Calcium, Ion 1.04 (*)    Hemoglobin 15.6 (*)    All other components within normal limits  SARS CORONAVIRUS 2 (HOSPITAL ORDER, PERFORMED IN [facility])  ETHANOL  DIFFERENTIAL  RAPID URINE DRUG SCREEN, HOSP PERFORMED  PROTIME-INR  BASIC METABOLIC PANEL    EKG EKG Interpretation  Date/Time:  Tuesday June 13 2019 12:54:18 EDT Ventricular Rate:  77 PR Interval:    QRS Duration: 97 QT Interval:  423 QTC Calculation: 479 R Axis:   -33 Text Interpretation:  Sinus rhythm Left axis deviation Probable anterior infarct, age indeterminate since last tracing no significant change Confirmed by Mancel Bale [phone]) on 06/13/2019 2:03:32 PM   Radiology No results found.  Procedures Procedures (including critical care time)  Medications Ordered in ED Medications  sodium chloride flush (NS) 0.9 % injection 10-40 mL (has no administration in time range)     Initial Impression / Assessment and Plan / ED Course  I have reviewed the triage vital signs and the nursing notes.  Pertinent labs & imaging results that were available during my care of the patient were reviewed by me and considered in my medical decision making (see chart for details).  Clinical Course as of Jun 12 1645  Tue Jun 13, 2019  1643 Normal except elevated potassium, elevated BUN, low calcium, increased hemoglobin  I-stat chem 8, ED(!!) [EW]  1643 Normal  SARS Coronavirus 2 [facility] order, Performed in [facility] hospital lab) Nasopharyngeal Nasopharyngeal Swab [EW]  1643 Normal  Ethanol [EW]  1643 Normal except MCV high  CBC(!) [EW]  1643 Normal except potassium high, with hemolysis, creatinine high, total bilirubin high, GFR low  Comprehensive metabolic panel(!) [EW]  1643 Normal  Protime-INR [EW]  1644 Normal except hemoglobin small  Urinalysis, Routine w reflex microscopic(!) [EW]    Clinical Course User Index [EW] Mancel Bale, MD        Patient Vitals for the  past 24 hrs:  BP Temp Temp src Pulse Resp SpO2 Height Weight  06/13/19 1315 - - - 73 14 99 % - -  06/13/19 1249 - - - - - - 5' 4.5" (1.638 m) 85.7 kg  06/13/19 1247 (!) 200/110 (!) 97.4 F (36.3 C) Oral 70 16 98 % - -  06/13/19 1245 (!) 155/87 - - 83 - 99 % - -     Medical Decision Making: Dysarthria and weakness, with intermittent headaches.  Patient previously evaluated with MRI, returns with persistent symptoms.  Additional imaging ordered, now with contrast and MRA head.  Neurology contacted by phone, to discuss proper test ordering.  Incidental hypokalemia likely hemolysis, repeat testing ordered.  CRITICAL CARE-no Performed by: Mancel Bale   Nursing Notes Reviewed/ Care Coordinated Applicable Imaging Reviewed Interpretation of Laboratory Data incorporated into ED treatment  Plan-disposition by oncoming provider team.    Final Clinical Impressions(s) / ED Diagnoses   Final diagnoses:  Dysarthria  Weakness    ED Discharge Orders    None       Mancel Bale, MD 06/13/19 1646

## 2019-06-13 NOTE — Telephone Encounter (Signed)
Pt was seen in the ER she was told to follow up with Neurology and requesting to schedule with you. The soonest appointment that you have is November 18. I scheduled pt for that day but she says that she needs to be seen sooner because she had a stroke. Can you review the notes from the ER and confirm if patient needs to be seen sooner and advise me where to schedule her? Thank you

## 2019-06-13 NOTE — Discharge Instructions (Addendum)
You were evaluated in the Emergency Department and after careful evaluation, we did not find any emergent condition requiring admission or further testing in the hospital.  Please return to the Emergency Department if you experience any worsening of your condition.  We encourage you to follow up with [facility] neurology.  Thank you for allowing [place] to be a part of your care.

## 2019-06-13 NOTE — ED Provider Notes (Signed)
  Provider Note MRN:  [id number]  Arrival date & time: 06/13/19    ED Course and Medical Decision Making  Assumed care from Dr. Eulis Foster at shift change.  Question of functional neurological actions versus stroke, MRI pending.  Patient with hyperkalemia but with hemolyzed blood, will redraw.  Anticipating discharge after negative MRI.  6:30 PM update: MRI is normal.  Potassium was repeated and is normal as well.  Patient is appropriate for discharge.  Advised neurology follow-up.  Final Clinical Impressions(s) / ED Diagnoses     ICD-10-CM   1. Dysarthria  R47.1   2. Weakness  R53.1     ED Discharge Orders         Ordered    Ambulatory referral to Neurology    Comments: An appointment is requested in approximately: 2 weeks   06/13/19 1840            Discharge Instructions     You were evaluated in the Emergency Department and after careful evaluation, we did not find any emergent condition requiring admission or further testing in the hospital.  Please return to the Emergency Department if you experience any worsening of your condition.  We encourage you to follow up with [facility] neurology.  Thank you for allowing [place] to be a part of your care.      Barth Kirks. [place] Small, [place] [email or web address]    Maudie Flakes, MD 06/13/19 [phone]

## 2019-06-13 NOTE — ED Triage Notes (Signed)
Patient arrived via EMS after being seen at PCP and Headache Neurology. Reports increase in stroke like symptoms. Increasing aphasia and decreased strength in right arm since 10/1.

## 2019-06-13 NOTE — ED Notes (Signed)
Called lab to addon BMP. 

## 2019-06-14 NOTE — Telephone Encounter (Signed)
It appears like this is a new consult forme. Can see next available

## 2019-06-15 ENCOUNTER — Encounter: Payer: Self-pay | Admitting: Neurology

## 2019-06-15 ENCOUNTER — Telehealth: Payer: Self-pay

## 2019-06-15 ENCOUNTER — Ambulatory Visit: Payer: BLUE CROSS/BLUE SHIELD | Admitting: Cardiology

## 2019-06-15 ENCOUNTER — Other Ambulatory Visit: Payer: Self-pay

## 2019-06-15 ENCOUNTER — Ambulatory Visit (INDEPENDENT_AMBULATORY_CARE_PROVIDER_SITE_OTHER): Payer: BC Managed Care – PPO | Admitting: Neurology

## 2019-06-15 VITALS — BP 139/89 | HR 90 | Temp 97.7°F | Wt 180.0 lb

## 2019-06-15 DIAGNOSIS — R4789 Other speech disturbances: Secondary | ICD-10-CM

## 2019-06-15 DIAGNOSIS — G43009 Migraine without aura, not intractable, without status migrainosus: Secondary | ICD-10-CM

## 2019-06-15 DIAGNOSIS — R202 Paresthesia of skin: Secondary | ICD-10-CM

## 2019-06-15 DIAGNOSIS — R200 Anesthesia of skin: Secondary | ICD-10-CM | POA: Diagnosis not present

## 2019-06-15 DIAGNOSIS — R2990 Unspecified symptoms and signs involving the nervous system: Secondary | ICD-10-CM | POA: Diagnosis not present

## 2019-06-15 LAB — I-STAT CHEM 8, ED
BUN: 26 mg/dL — ABNORMAL HIGH (ref 8–23)
Calcium, Ion: 1.04 mmol/L — ABNORMAL LOW (ref 1.15–1.40)
Chloride: 107 mmol/L (ref 98–111)
Creatinine, Ser: 1 mg/dL (ref 0.44–1.00)
Glucose, Bld: 81 mg/dL (ref 70–99)
HCT: 46 % (ref 36.0–46.0)
Hemoglobin: 15.6 g/dL — ABNORMAL HIGH (ref 12.0–15.0)
Potassium: 7.3 mmol/L (ref 3.5–5.1)
Sodium: 136 mmol/L (ref 135–145)
TCO2: 28 mmol/L (ref 22–32)

## 2019-06-15 MED ORDER — DIVALPROEX SODIUM ER 500 MG PO TB24: 500.0000 mg | ORAL_TABLET | Freq: Every day | ORAL | 2 refills | Status: DC

## 2019-06-15 NOTE — Patient Instructions (Signed)
S I had a long discussion with the patient and her husband regarding her speech disturbance and right-sided subjective weakness and numbness being of unclear etiology and having 2 separate MRI scans being negative stroke would be less likely.  Possibilities include atypical migraine versus seizures versus conversion reaction.  I recommend a trial of Depakote ER 500 mg daily and recommend outpatient speech therapy and physical occupational therapy.  She was advised to stay out of work until she improved.  She will return for follow-up in the future in 2 months with my nurse practitioner call earlier if necessary.

## 2019-06-15 NOTE — Progress Notes (Signed)
[facility] [address] [place]. [place] [id number] [phone]       OFFICE CONSULT NOTE  [name] Date of Birth:  03/08/1956 Medical Record Number:  [id number]   Referring MD: Tonya Boyd Reason for Referral: Stroke like episode  HPI: Tonya Boyd is a 63 year old Caucasian lady seen today for initial office consultation visit.  She is accompanied by her husband.  History is obtained from them, review of electronic medical records and I personally reviewed imaging films in PACS.  She has past medical history of hypertension, hyperlipidemia, coronary artery disease, kidney stones, hiatal hernia, gastroesophageal reflux disease, chronic bronchitis.  She states she had episode in early September when she woke up she felt she could not think right and she had brain fog she was off balance numbness on the right side of the face and tongue as well as right hand she is went and saw her primary care physician who ordered an outpatient MRI scan of the brain done on 05/17/2019 at [facility] which I personally reviewed showed no acute abnormalities.  Mild changes of chronic small vessel disease only noted.  She was seen in the ER on 06/08/2019 with sudden onset of speech change and right-sided weakness.  She also had some chest pain in the morning and took some nitroglycerin.  She also had a headache.  She states she does not get many headaches.  CT scan of the head in the ER was unremarkable.  She had some subjective speech difficulties and right-sided weakness with giveaway weakness as per Dr. Alene Boyd's exam.  MRI scan of the brain was obtained which showed no acute abnormality and only changes of small vessel disease.  MRI of the brain showed no significant large vessel stenosis..  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  Patient states symptoms have persisted since then.  She is able to speak now but has trouble speaking and speaks in with a  childlike quality to her speech.  Still has subjective weakness and numbness on the right side but is able to walk without assistance.  She states she cannot think because she has brain fog and will not be able to return to work.  She feels better.  She denies any prior known history of strokes TIAs or seizures.  She does take aspirin every day and is on medication for blood pressure and cholesterol for her coronary artery disease.  She denies prior history of migraines or headaches with neurological symptoms.  She denies any significant ongoing stress in her life.  Her daughter died 5 years ago but she states she is coping with that.  Patient feels she is unable to work because she gets confused and the brain cannot think right.  She has in fact not even been driving.  ROS:   14 system review of systems is positive for speech difficulty, memory difficulty, trouble thinking, weakness, numbness, gait difficulty all other systems negative PMH:  Past Medical History:  Diagnosis Date   Angina pectoris (HCC)    Asthma    "as a child and as an adult" (08/02/2018)   CAD (coronary artery disease), native coronary artery 08/03/2018   Chronic bronchitis (HCC)    Chronic kidney disease    Coronary artery disease    Family history of adverse reaction to anesthesia    "cousin stopped breathing; he was allergic to the anesthesia" (08/02/2018)   GERD (gastroesophageal reflux disease)    High cholesterol  History of gout    History of hiatal hernia    History of kidney stones    Hypertension 07/2018   Interstitial cystitis    NSVT (nonsustained ventricular tachycardia) (HCC) 07/2018   Raynaud's disease    "feet and hands" (08/02/2018)   Raynaud's disease without gangrene 10/15/2018    Social History:  Social History   Socioeconomic History   Marital status: Married    Spouse name: Not on file   Number of children: 3   Years of education: GED   Highest education level: Not on  file  Occupational History    Employer: [employer]  Social Needs   Financial resource strain: Not on file   Food insecurity    Worry: Not on file    Inability: Not on file   Transportation needs    Medical: Not on file    Non-medical: Not on file  Tobacco Use   Smoking status: Never Smoker   Smokeless tobacco: Never Used  Substance and Sexual Activity   Alcohol use: Not Currently    Comment: 08/02/2018 "not since my 20's"   Drug use: Never   Sexual activity: Not on file  Lifestyle   Physical activity    Days per week: Not on file    Minutes per session: Not on file   Stress: Not on file  Relationships   Social connections    Talks on phone: Not on file    Gets together: Not on file    Attends religious service: Not on file    Active member of club or organization: Not on file    Attends meetings of clubs or organizations: Not on file    Relationship status: Not on file   Intimate partner violence    Fear of current or ex partner: Not on file    Emotionally abused: Not on file    Physically abused: Not on file    Forced sexual activity: Not on file  Other Topics Concern   Not on file  Social History Narrative   Not on file    Medications:   Current Outpatient Medications on File Prior to Visit  Medication Sig Dispense Refill   aspirin EC 81 MG tablet Take 81 mg by mouth daily.     cyclobenzaprine (FLEXERIL) 10 MG tablet Take 10 mg by mouth at bedtime.     Evolocumab (REPATHA) 140 MG/ML SOSY Inject 140 mg into the skin every 14 (fourteen) days. 140 mL 4   isosorbide mononitrate (IMDUR) 30 MG 24 hr tablet Take 1 tablet (30 mg total) by mouth 2 (two) times daily. 1/2 after waking in am and 2-3 hours later 60 tablet 3   labetalol (NORMODYNE) 100 MG tablet TAKE ONE TABLET BY MOUTH TWICE A DAY (Patient taking differently: Take 50 mg by mouth 2 (two) times daily. ) 180 tablet 0   nitroGLYCERIN (NITROSTAT) 0.4 MG SL tablet Place 1  tablet (0.4 mg total) under the tongue every 5 (five) minutes as needed for chest pain. 30 tablet 2   oxybutynin (DITROPAN) 5 MG tablet Take 2.5 mg by mouth at bedtime.   1   pantoprazole (PROTONIX) 20 MG tablet TAKE 1 TABLET BY MOUTH ONCE A DAY. (Patient taking differently: Take 20 mg by mouth daily before breakfast. ) 90 tablet 0   ranolazine (RANEXA) 500 MG 12 hr tablet Take 1 tablet (500 mg total) by mouth 2 (two) times daily. 60 tablet 3   triazolam (HALCION) 0.125 MG tablet  Take 0.125 mg by mouth at bedtime.     verapamil (CALAN-SR) 120 MG CR tablet TAKE 1 TABLET BY MOUTH ONCE A DAY. (Patient taking differently: Take 120 mg by mouth at bedtime. ) 30 tablet 0   Vitamin D, Ergocalciferol, (DRISDOL) 1.25 MG (50000 UT) CAPS capsule Take 50,000 Units by mouth every 7 (seven) days. Tuesdays     Current Facility-Administered Medications on File Prior to Visit  Medication Dose Route Frequency Provider Last Rate Last Dose   Evolocumab SOSY 140 mg  140 mg Subcutaneous Once Adrian Prows, MD       Evolocumab SOSY 140 mg  140 mg Subcutaneous Once Miquel Dunn, NP        Allergies:   Allergies  Allergen Reactions   Adhesive [Tape] Other (See Comments)    SKIN WILL TEAR EASILY!!!!   Statins Other (See Comments)    MYAILGIAS    Carvedilol    Diovan [Valsartan]     Tingling    Hydrochlorothiazide    Ibuprofen    Keflex [Cephalexin]     confusion   Metoclopramide Hcl     "Made me feel goofy"   Montelukast Sodium Diarrhea   Morphine And Related     hyper   Norvasc [Amlodipine Besylate]     Numbness and tingling    Nsaids     Esophagus became red and swollen (affected it)   Oxycodone Itching and Other (See Comments)    Welts, also (Tylox)   Oxycodone-Acetaminophen Itching and Other (See Comments)    Welts, also   Paracetamol [Acetaminophen] Itching    Tylox = Welts, also   Plavix [Clopidogrel Bisulfate] Other (See Comments)    Red brusing   Prednisone       Elevated BP   Reglan [Metoclopramide]     confusion   Singulair [Montelukast] Diarrhea    Physical Exam General: well developed, well nourished middle aged Caucasian lady, seated, in no evident distress Head: head normocephalic and atraumatic.   Neck: supple with no carotid or supraclavicular bruits Cardiovascular: regular rate and rhythm, no murmurs Musculoskeletal: no deformity Skin:  no rash/petichiae Vascular:  Normal pulses all extremities  Neurologic Exam Mental Status: Awake and fully alert. Oriented to place and time. Recent and remote memory intact. Attention span, concentration and fund of knowledge appropriate. Mood and affect appropriate. Bizarre child like quality to speech.occasional word hesitancy. MMSE scored 25/30 with deficits in attention, recall and orientation.  Geriatric depression scale scored only to not depressed.  Able to name 12 animals which can walk on 4 legs.  Clock drawing 3/4.  Recall 2/3. Cranial Nerves: Fundoscopic exam reveals sharp disc margins. Pupils equal, briskly reactive to light. Extraocular movements full without nystagmus. Visual fields full to confrontation. Hearing intact. Facial sensation intact. Face, tongue, palate moves normally and symmetrically.  Motor: Normal bulk and tone. Normal strength in all tested extremity muscles.but poor effort and give away weakness on right with poor and variable effort Sensory.: diminished  touch , pinprick , position and vibratory sensation on right hemibody including forehead with splitting of midline.  Coordination: Rapid alternating movements normal in all extremities. Finger-to-nose and heel-to-shin performed accurately bilaterally. Gait and Station: Arises from chair without difficulty. Stance is normal. Gait demonstrates normal stride length and balance . Able to heel, toe and tandem walk without difficulty.  Reflexes: 1+ and symmetric. Toes downgoing.       ASSESSMENT: 62 year patient with  speech difficulty, right sided weakness and numbness from  stroke like episode of unclear etiology. MRI brain x 2 negative for stroke. 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 Exam shows nonorganic features.No obvious underlying psychosocial stressors identified.     PLAN:   I had a long discussion with the patient and her husband regarding her speech disturbance and right-sided subjective weakness and numbness being of unclear etiology and having 2 separate MRI scans being negative stroke would be less likely.  Possibilities include atypical migraine versus seizures versus conversion reaction.  I recommend a trial of Depakote ER 500 mg daily and recommend outpatient speech therapy and physical occupational therapy.  She was advised to stay out of work until she improved. I have spent a total of  45 minutes with the patient reviewing hospital notes,  test results, labs and examining the patient as well as establishing an assessment and plan that was discussed personally with the patient.  > 50% of time was spent in direct patient care.      She will return for follow-up in the future in 2 months with my nurse practitioner call earlier if necessary. Delia Heady, MD  [facility] [address] [place], [place] [address]  Phone [phone] Fax [phone] Note: This document was prepared with digital dictation and possible smart phrase technology. Any transcriptional errors that result from this process are unintentional.

## 2019-06-15 NOTE — Telephone Encounter (Signed)
Pt called c/o symptoms of stroke; Slurred speech and R arm pain; I told her to go to ER and she refuses she says they wont do anything and has an appt with neurology this afternoon and just wants to make [place] aware; Spoke with [place] about this she is aware

## 2019-06-21 ENCOUNTER — Encounter (HOSPITAL_COMMUNITY): Payer: Self-pay | Admitting: Speech Pathology

## 2019-06-21 ENCOUNTER — Ambulatory Visit (HOSPITAL_COMMUNITY): Payer: BC Managed Care – PPO | Attending: Neurology | Admitting: Speech Pathology

## 2019-06-21 ENCOUNTER — Other Ambulatory Visit: Payer: Self-pay

## 2019-06-21 DIAGNOSIS — R41841 Cognitive communication deficit: Secondary | ICD-10-CM | POA: Insufficient documentation

## 2019-06-21 DIAGNOSIS — R278 Other lack of coordination: Secondary | ICD-10-CM | POA: Insufficient documentation

## 2019-06-21 DIAGNOSIS — M6281 Muscle weakness (generalized): Secondary | ICD-10-CM | POA: Diagnosis present

## 2019-06-21 DIAGNOSIS — R2689 Other abnormalities of gait and mobility: Secondary | ICD-10-CM | POA: Insufficient documentation

## 2019-06-21 DIAGNOSIS — R29898 Other symptoms and signs involving the musculoskeletal system: Secondary | ICD-10-CM | POA: Diagnosis present

## 2019-06-21 DIAGNOSIS — M79601 Pain in right arm: Secondary | ICD-10-CM | POA: Insufficient documentation

## 2019-06-21 NOTE — Therapy (Signed)
[facility] [address] [place], [place], [address] Phone: [phone]   Fax:  [phone]  Speech Language Pathology Evaluation  Patient Details  Name: Tonya Boyd MRN: [id number] Date of Birth: 09/11/1955 Referring Provider (SLP): Secundino Ginger, MD   Encounter Date: 06/21/2019  End of Session - 06/21/19 1136    Visit Number  1    Number of Visits  9    Date for SLP Re-Evaluation  07/27/19    Authorization Type  [employer]BS PPO    SLP Start Time  1035    SLP Stop Time   1135    SLP Time Calculation (min)  60 min    Activity Tolerance  Patient tolerated treatment well       Past Medical History:  Diagnosis Date  . Angina pectoris (HCC)   . Asthma    "as a child and as an adult" (08/02/2018)  . CAD (coronary artery disease), native coronary artery 08/03/2018  . Chronic bronchitis (HCC)   . Chronic kidney disease   . Coronary artery disease   . Family history of adverse reaction to anesthesia    "cousin stopped breathing; he was allergic to the anesthesia" (08/02/2018)  . GERD (gastroesophageal reflux disease)   . High cholesterol   . History of gout   . History of hiatal hernia   . History of kidney stones   . Hypertension 07/2018  . Interstitial cystitis   . NSVT (nonsustained ventricular tachycardia) (HCC) 07/2018  . Raynaud's disease    "feet and hands" (08/02/2018)  . Raynaud's disease without gangrene 10/15/2018    Past Surgical History:  Procedure Laterality Date  . ABDOMINAL HYSTERECTOMY    . ANTERIOR CERVICAL DECOMP/DISCECTOMY FUSION    . APPENDECTOMY    . AUGMENTATION MAMMAPLASTY Bilateral   . BACK SURGERY    . CORONARY ANGIOPLASTY WITH STENT PLACEMENT  08/02/2018  . CORONARY STENT INTERVENTION N/A 08/02/2018   Procedure: CORONARY STENT INTERVENTION;  Surgeon: [name], MD;  Location: [facility] IN[facility];  Service: Cardiovascular;  Laterality: N/A;  RCA   . CYSTOSCOPY W/ STONE MANIPULATION    .  INTRAVASCULAR PRESSURE WIRE/FFR STUDY N/A 10/07/2018   Procedure: INTRAVASCULAR PRESSURE WIRE/FFR STUDY;  Surgeon: [name], MD;  Location: [facility] INVAS[facility] CV LAB;  Service: Cardiovascular;  Laterality: N/A;  . KNEE ARTHROSCOPY Right   . LAPAROSCOPIC CHOLECYSTECTOMY    . LEFT HEART CATH AND CORONARY ANGIOGRAPHY N/A 10/07/2018   Procedure: LEFT HEART CATH AND CORONARY ANGIOGRAPHY;  Surgeon: [name], MD;  Location: [facility]AS[facility] CV [facility];  Service: Cardiovascular;  Laterality: N/A;  . REPAIR ANKLE LIGAMENT Left    "tied up ligament; had tore up qthing in my ankle"  . RIGHT HEART CATH  10/07/2018  . RIGHT/LEFT HEART CATH AND CORONARY ANGIOGRAPHY N/A 08/02/2018   Procedure: RIGHT/LEFT HEART CATH AND CORONARY ANGIOGRAPHY;  Surgeon: [name], MD;  Location: [facility];  Service: Cardiovascular;  Laterality: N/A;  . SHOULDER SURGERY     bilateral  . TONSILLECTOMY    . TUBAL LIGATION      There were no vitals filed for this visit.  Subjective Assessment - 06/21/19 1043    Subjective  "Me never have those." (migraines)    Currently in Pain?  No/denies         SLP Evaluation [facility] - 06/21/19 1043      SLP Visit Information   SLP Received On  06/21/19    Referring Provider (SLP)  Secundino Ginger, MD    Onset Date  06/08/19    Medical Diagnosis  strok-like episode R29.90      Subjective   Subjective  "That be close."      General Information   HPI  62 year patient with speech difficulty, right sided weakness and numbness from stroke like episode of unclear etiology. MRI brain x 2 negative for stroke. 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 Exam shows nonorganic features.No obvious underlying psychosocial stressors identified.    Behavioral/Cognition  Alert and cooperative    Mobility Status  ambulatory      Balance Screen   Has the patient fallen in the past 6 months  No    Has the patient had a decrease in activity level because of a fear of falling?    No    Is the patient reluctant to leave their home because of a fear of falling?   No      Prior Functional Status   Cognitive/Linguistic Baseline  Within functional limits    Type of Home  House   2-story    Lives With  Spouse    Available Support  Family    Education  GED    Vocation  Full time employment   30 hours per week, FMLA now     Cognition   Overall Cognitive Status  Impaired/Different from baseline    Area of Impairment  Memory    Memory  Decreased short-term memory   1/5 after 5 minutes; 4/5 with cues   Memory  Impaired    Memory Impairment  Retrieval deficit    Awareness  Appears intact    Problem Solving  Appears intact      Auditory Comprehension   Overall Auditory Comprehension  Appears within functional limits for tasks assessed    Yes/No Questions  Within Functional Limits    Commands  Within Functional Limits    Conversation  Complex    Interfering Components  Working memory    Sales promotion account executive  Within Function Limits      Reading Comprehension   Reading Status  Impaired    Paragraph Level  76-100% accurate    Functional Environmental (signs, name badge)  Within functional limits    Interfering Components  Visual scanning      Expression   Primary Mode of Expression  Verbal      Verbal Expression   Overall Verbal Expression  Impaired    Initiation  No impairment    Automatic Speech  Name;Social Response;Day of week;Month of year    Level of Generative/Spontaneous Verbalization  Conversation    Repetition  Impaired    Level of Impairment  Phrase level    Naming  Impairment    Responsive  76-100% accurate    Confrontation  75-100% accurate    Convergent  75-100% accurate    Divergent  75-100% accurate    Verbal Errors  Consistent;Phonemic paraphasias;Semantic paraphasias;Aware of errors;Not aware of errors    Pragmatics  No impairment    Effective Techniques  Phonemic  cues;Articulatory cues    Non-Verbal Means of Communication  Not applicable      Written Expression   Dominant Hand  Left    Written Expression  Exceptions to [facility]    Self Formulation Ability  Phrase   spelling errors, omissions     Oral Motor/Sensory Function   Overall Oral Motor/Sensory Function  Appears within functional limits for tasks assessed    Labial ROM  Within Functional Limits    Labial Symmetry  Within Functional Limits    Labial Strength  Reduced Right    Labial Sensation  Reduced Right    Labial Coordination  WFL    Lingual ROM  Within Functional Limits    Lingual Symmetry  Within Functional Limits    Lingual Strength  Reduced    Lingual Sensation  Reduced    Facial ROM  Within Functional Limits    Facial Symmetry  Within Functional Limits    Facial Strength  Within Functional Limits    Facial Sensation  Reduced    Facial Coordination  WFL    Velum  Within Functional Limits    Mandible  Within Functional Limits      Motor Speech   Overall Motor Speech  Impaired    Respiration  Within functional limits    Phonation  Normal    Resonance  Within functional limits    Articulation  Impaired    Level of Impairment  Word    Intelligibility  Intelligibility reduced    Word  75-100% accurate    Phrase  75-100% accurate    Sentence  75-100% accurate    Conversation  75-100% accurate    Motor Planning  Impaired    Level of Impairment  Word    Motor Speech Errors  Aware;Unaware;Consistent    Effective Techniques  Slow rate    Phonation  WFL      Standardized Assessments   Standardized Assessments   Montreal Cognitive Assessment [facility](MOCA)    Montreal Cognitive Assessment [facility](MOCA)   17/30           06/27/19 1653  SLP SHORT TERM GOAL #1  Title Pt will produce s-blends in the initial position of words with 90% acc when labeling pictures with min cues for error awareness.  Baseline 25%  Time 4  Period Weeks  Status New  Target Date 07/27/19  SLP SHORT TERM GOAL  #2  Title Pt will imitate 7+ word sentences with 95% acc with cue for error awareness for proper grammar/syntax and allowance for articulatory errors.  Baseline Pt substitutes me/I, is/be, etc; 75%  Time 4  Period Weeks  Status New  Target Date 07/27/19  SLP SHORT TERM GOAL #3  Title Pt will describe pictures by generating sentences of 5+ words using proper grammar and syntax with 90% acc and min cues.  Baseline 70% acc  Time 4  Period Weeks  Status New  Target Date 07/27/19  SLP SHORT TERM GOAL #4  Title Pt will complete functional working memory activities with 90% acc and use of memory strategies when provided mi/mod cues.  Baseline 25% no assist  Time 4  Period Weeks  Status New  Target Date 07/27/19  SLP SHORT TERM GOAL #5  Title Continue ongoing assessment of speech changes related to grammar, syntax, and articulatory errors over the next few sessions.  Time 4  Period Weeks  Status New  Target Date 07/27/19       SLP Long Term Goals - 06/21/19 1223      SLP LONG TERM GOAL #1   Title  Same as short term goals       Plan - 06/21/19 1220    Clinical Impression Statement  Pt presents with mild cognitive linguistic deficits characterized by reduced working memory, reduced word retrieval, and reduced speech fluency with atypical grammatical, syntax, and articulatory  errors (stopping, fronting, omissions). Pt scored a 17/30 on the MoCA 7.3 with errors in visual perceptual/executive function, naming, memory, and attention. Pt exhibited adequate content during picture description task, but with reduced fluency. Examples of Pt's speech include: "Dat be duh dame" (that is the same) and "Me can't help". Pt will benefit from skilled SLP in order to address the above impairments, maximize independence, and decrease burden of care. Ongoing assessment to occur over the next few sessions due to atypical speech deficits.    Speech Therapy Frequency  2x / week    Duration  4 weeks     Treatment/Interventions  Cueing hierarchy;SLP instruction and feedback;Compensatory techniques;Compensatory strategies;Functional tasks;Patient/family education;Multimodal communcation approach    Potential to Achieve Goals  Good    SLP Home Exercise Plan  Pt will complete HEP as assigned to facilitate carryover of treatment strategies and techniques in home environment independently.    Consulted and Agree with Plan of Care  Patient       Patient will benefit from skilled therapeutic intervention in order to improve the following deficits and impairments:   Cognitive communication deficit    Problem List Patient Active Problem List   Diagnosis Date Noted  . Raynaud's disease without gangrene 10/15/2018  . Angina pectoris ([place]) 10/07/2018  . Essential hypertension 08/03/2018  . CAD (coronary artery disease), native coronary artery 08/03/2018  . Post PTCA 08/02/2018  . Elevated IgE level 07/19/2018  . Dyspnea on exertion 07/19/2018  . Long-term exposure involving bird droppings 07/19/2018  . Iliotibial band syndrome of left side 10/21/2011   Thank you,  Genene Churn, [place]  [facility] 06/21/2019, 12:25 PM  [place], [place], [address] Phone: [phone]   Fax:  [phone]  Name: Tonya Boyd MRN: [id number] Date of Birth: 12-11-1955

## 2019-06-23 ENCOUNTER — Other Ambulatory Visit: Payer: Self-pay

## 2019-06-23 ENCOUNTER — Ambulatory Visit (INDEPENDENT_AMBULATORY_CARE_PROVIDER_SITE_OTHER): Payer: BC Managed Care – PPO | Admitting: Cardiology

## 2019-06-23 ENCOUNTER — Encounter: Payer: Self-pay | Admitting: Cardiology

## 2019-06-23 VITALS — BP 152/91 | HR 80 | Temp 95.7°F | Ht 64.0 in | Wt 196.0 lb

## 2019-06-23 DIAGNOSIS — R471 Dysarthria and anarthria: Secondary | ICD-10-CM | POA: Diagnosis not present

## 2019-06-23 DIAGNOSIS — I10 Essential (primary) hypertension: Secondary | ICD-10-CM | POA: Diagnosis not present

## 2019-06-23 DIAGNOSIS — I25118 Atherosclerotic heart disease of native coronary artery with other forms of angina pectoris: Secondary | ICD-10-CM | POA: Diagnosis not present

## 2019-06-23 NOTE — Progress Notes (Signed)
Primary Physician/Referring:  Merrilee Seashore, MD  Patient ID: Tonya Boyd, female    DOB: May 31, 1956, 63 y.o.   MRN: [id number]  Chief Complaint  Patient presents with  . Chest Pain  . Follow-up    3 week    HPI: Tonya Boyd  is a 63 y.o. female  with raynaud's disease and GERD. Due to worsening dyspnea and also chest pain and abnormal nuclear stress test revealing inferior ischemia, event monitor showing NSVT, patient underwent coronary angiogram and was found to have a very large RCA dominant with a high-grade 80% stenosis for which she underwent angioplasty on 08/02/2018. Normal right heart catheterization.  She was last seen a few weeks ago for acute visit for angina. Symptoms felt to be related to vasospasm. Ranexa was increased.  During the interim, she again developed TIA/CVA symptoms with brain fog, right-sided weakness, dysarthria, and right-sided tongue numbness. She was evaluated in the ER on 06/13/2019, MRI was negative.  She has since been evaluated by Dr. Leonie Boyd, in which etiology remains unclear.  Possibilities include atypical migraine versus seizure versus conversion reaction.  She has been started on Depakote, undergoing speech and physical therapy, also planning to have a EEG performed.  She was taken out of work until her symptoms can improve.  Since being out of work her symptoms of angina have significantly improved and she has only had to use nitroglycerin one time.  Blood pressure has also been well controlled.  She monitors this daily.  States that she was unable to take all of her medications this morning as she woke up late.  She reportedly had TIA symptoms in early September with with waking up feeling brain fog, off balance,right side of her face and tongue felt numb and her right hand felt heavy. Seen by her PCP. Underwent MRI of the brain on 05/17/19 that was negative for acute CVA.  Symptoms have now improved.   Past Medical History:  Diagnosis  Date  . Angina pectoris ([place])   . Asthma    "as a child and as an adult" (08/02/2018)  . CAD (coronary artery disease), native coronary artery 08/03/2018  . Chronic bronchitis ([place])   . Chronic kidney disease   . Coronary artery disease   . Family history of adverse reaction to anesthesia    "cousin stopped breathing; he was allergic to the anesthesia" (08/02/2018)  . GERD (gastroesophageal reflux disease)   . High cholesterol   . History of gout   . History of hiatal hernia   . History of kidney stones   . Hypertension 07/2018  . Interstitial cystitis   . NSVT (nonsustained ventricular tachycardia) ([place]) 07/2018  . Raynaud's disease    "feet and hands" (08/02/2018)  . Raynaud's disease without gangrene 10/15/2018    Past Surgical History:  Procedure Laterality Date  . ABDOMINAL HYSTERECTOMY    . ANTERIOR CERVICAL DECOMP/DISCECTOMY FUSION    . APPENDECTOMY    . AUGMENTATION MAMMAPLASTY Bilateral   . BACK SURGERY    . CORONARY ANGIOPLASTY WITH STENT PLACEMENT  08/02/2018  . CORONARY STENT INTERVENTION N/A 08/02/2018   Procedure: CORONARY STENT INTERVENTION;  Surgeon: Adrian Prows, MD;  Location: [facility];  Service: Cardiovascular;  Laterality: N/A;  RCA   . CYSTOSCOPY W/ STONE MANIPULATION    . INTRAVASCULAR PRESSURE WIRE/FFR STUDY N/A 10/07/2018   Procedure: INTRAVASCULAR PRESSURE WIRE/FFR STUDY;  Surgeon: Adrian Prows, MD;  Location: [facility];  Service: Cardiovascular;  Laterality:  N/A;  . KNEE ARTHROSCOPY Right   . LAPAROSCOPIC CHOLECYSTECTOMY    . LEFT HEART CATH AND CORONARY ANGIOGRAPHY N/A 10/07/2018   Procedure: LEFT HEART CATH AND CORONARY ANGIOGRAPHY;  Surgeon: Adrian Prows, MD;  Location: [facility];  Service: Cardiovascular;  Laterality: N/A;  . REPAIR ANKLE LIGAMENT Left    "tied up ligament; had tore up qthing in my ankle"  . RIGHT HEART CATH  10/07/2018  . RIGHT/LEFT HEART CATH AND CORONARY ANGIOGRAPHY N/A 08/02/2018   Procedure: RIGHT/LEFT  HEART CATH AND CORONARY ANGIOGRAPHY;  Surgeon: Adrian Prows, MD;  Location: [facility];  Service: Cardiovascular;  Laterality: N/A;  . SHOULDER SURGERY     bilateral  . TONSILLECTOMY    . TUBAL LIGATION      Social History   Socioeconomic History  . Marital status: Married    Spouse name: Not on file  . Number of children: 3  . Years of education: GED  . Highest education level: Not on file  Occupational History    Employer: [employer]  Social Needs  . Financial resource strain: Not on file  . Food insecurity    Worry: Not on file    Inability: Not on file  . Transportation needs    Medical: Not on file    Non-medical: Not on file  Tobacco Use  . Smoking status: Never Smoker  . Smokeless tobacco: Never Used  Substance and Sexual Activity  . Alcohol use: Not Currently    Comment: 08/02/2018 "not since my 20's"  . Drug use: Never  . Sexual activity: Not on file  Lifestyle  . Physical activity    Days per week: Not on file    Minutes per session: Not on file  . Stress: Not on file  Relationships  . Social Herbalist on phone: Not on file    Gets together: Not on file    Attends religious service: Not on file    Active member of club or organization: Not on file    Attends meetings of clubs or organizations: Not on file    Relationship status: Not on file  . Intimate partner violence    Fear of current or ex partner: Not on file    Emotionally abused: Not on file    Physically abused: Not on file    Forced sexual activity: Not on file  Other Topics Concern  . Not on file  Social History Narrative  . Not on file    Review of Systems  Constitution: Negative for chills, decreased appetite, malaise/fatigue and weight gain.  Cardiovascular: Positive for chest pain and dyspnea on exertion (stable). Negative for irregular heartbeat, leg swelling and syncope.  Endocrine: Positive for cold intolerance (raynaud's disease).   Hematologic/Lymphatic: Does not bruise/bleed easily.  Skin: Negative for color change and nail changes.  Musculoskeletal: Negative for joint swelling.  Gastrointestinal: Negative for abdominal pain, anorexia, change in bowel habit, hematochezia and melena.  Neurological: Negative for headaches and light-headedness.  Psychiatric/Behavioral: Negative for depression and substance abuse.  All other systems reviewed and are negative.     Objective  Blood pressure (!) 152/91, pulse 80, temperature (!) 95.7 F (35.4 C), height 5' 4"  (1.626 m), weight 196 lb (88.9 kg), SpO2 95 %. Body mass index is 33.64 kg/m.     Physical Exam  Constitutional: She appears well-developed and well-nourished. No distress.  HENT:  Head: Atraumatic.  Eyes: Conjunctivae are normal.  Neck: Neck  supple. No JVD present. No thyromegaly present.  Cardiovascular: Normal rate, regular rhythm, normal heart sounds and intact distal pulses. Exam reveals no gallop.  No murmur heard. Pulmonary/Chest: Effort normal and breath sounds normal.  Abdominal: Soft. Bowel sounds are normal.  Musculoskeletal: Normal range of motion.  Neurological: She is alert.  Skin: Skin is warm and dry.  Psychiatric: She has a normal mood and affect.   Radiology: No results found.  Laboratory examination:   10/03/2018: Creatinine 0.9, EGFR 64/78, potassium 4.3, BMP normal.  RBC 3.9, CBC otherwise normal.  Labs 07/29/2018: HB 14.9/HCT 44.0, platelets 275.  Normal indicis.  Serum glucose 94 mg, BUN 22, creatinine 0.93, eGFR 67 mL, potassium 4.4.  CMP Latest Ref Rng & Units 06/13/2019 06/13/2019 06/13/2019  Glucose 70 - 99 mg/dL 81 81 78  BUN 8 - 23 mg/dL 15 26(H) 18  Creatinine 0.44 - 1.00 mg/dL 0.99 1.00 1.04(H)  Sodium 135 - 145 mmol/L 142 136 139  Potassium 3.5 - 5.1 mmol/L 4.0 7.3(HH) 5.7(H)  Chloride 98 - 111 mmol/L 104 107 103  CO2 22 - 32 mmol/L 26 - 23  Calcium 8.9 - 10.3 mg/dL 9.2 - 9.5  Total Protein 6.5 - 8.1 g/dL - - 7.1   Total Bilirubin 0.3 - 1.2 mg/dL - - 1.3(H)  Alkaline Phos 38 - 126 U/L - - 85  AST 15 - 41 U/L - - 36  ALT 0 - 44 U/L - - 24   CBC Latest Ref Rng & Units 06/13/2019 06/13/2019 06/08/2019  WBC 4.0 - 10.5 K/uL - 6.9 -  Hemoglobin 12.0 - 15.0 g/dL 15.6(H) 15.0 13.3  Hematocrit 36.0 - 46.0 % 46.0 46.5(H) 39.0  Platelets 150 - 400 K/uL - 187 -   Lipid Panel  No results found for: CHOL, TRIG, HDL, CHOLHDL, VLDL, LDLCALC, LDLDIRECT HEMOGLOBIN A1C No results found for: HGBA1C, MPG TSH No results for input(s): TSH in the last 8760 hours.  PRN Meds:. There are no discontinued medications. Current Meds  Medication Sig  . aspirin EC 81 MG tablet Take 81 mg by mouth daily.  . cyclobenzaprine (FLEXERIL) 10 MG tablet Take 10 mg by mouth at bedtime.  . divalproex (DEPAKOTE ER) 500 MG 24 hr tablet Take 1 tablet (500 mg total) by mouth daily.  . Evolocumab (REPATHA) 140 MG/ML SOSY Inject 140 mg into the skin every 14 (fourteen) days.  . isosorbide mononitrate (IMDUR) 30 MG 24 hr tablet Take 1 tablet (30 mg total) by mouth 2 (two) times daily. 1/2 after waking in am and 2-3 hours later  . labetalol (NORMODYNE) 100 MG tablet TAKE ONE TABLET BY MOUTH TWICE A DAY (Patient taking differently: Take 50 mg by mouth 2 (two) times daily. )  . nitroGLYCERIN (NITROSTAT) 0.4 MG SL tablet Place 1 tablet (0.4 mg total) under the tongue every 5 (five) minutes as needed for chest pain.  Marland Kitchen oxybutynin (DITROPAN) 5 MG tablet Take 2.5 mg by mouth at bedtime.   . pantoprazole (PROTONIX) 20 MG tablet TAKE 1 TABLET BY MOUTH ONCE A DAY. (Patient taking differently: Take 20 mg by mouth daily before breakfast. )  . ranolazine (RANEXA) 500 MG 12 hr tablet Take 1 tablet (500 mg total) by mouth 2 (two) times daily.  . triazolam (HALCION) 0.125 MG tablet Take 0.125 mg by mouth at bedtime.  . verapamil (CALAN-SR) 120 MG CR tablet TAKE 1 TABLET BY MOUTH ONCE A DAY. (Patient taking differently: Take 120 mg by mouth at bedtime. )  .  Vitamin D, Ergocalciferol, (DRISDOL) 1.25 MG (50000 UT) CAPS capsule Take 50,000 Units by mouth every 7 (seven) days. Tuesdays   Current Facility-Administered Medications for the 06/23/19 encounter (Office Visit) with Miquel Dunn, NP  Medication  . Evolocumab SOSY 140 mg  . Evolocumab SOSY 140 mg    Cardiac Studies:   Coronary angiogram 10/07/2018: No change from 08/02/2018 except D1 stenosis 50-60%, DFR negative. Normal right heart catheterization. The left ventricular systolic function is normal. LV end diastolic pressure is normal. Superdominant large RCA. Prox RCA to Mid RCA lesion is 80% stenosed S/P STENT ORSIRO DES 4.0X30 with 0% residual stenosis. Ost RPDA lesion is 50% stenosed. Small LAD ends before reaching apex.  7 day event monitor 07/13/2018-07/19/2018: Dominant rhythm: Normal sinus rhythm.  11 patient activated events occurred with symptoms of chest pain, shortness of breath, rapid heart rate, correlating with sinus tachycardia.  Fastest tachycardia episode was day one at 1400 with 133 beats per without reported symptoms.  When autodetect event of sinus rhythm with 6 beats of NSVT on day 2 10:51 AM with symptoms of chest pain, shortness of breath.  No A. fib or SVT was noted.  Echocardiogram 07/23/2018: Normal LV systolic function, grade 1 diastolic dysfunction, mildly calcified aortic leaflets, no significant valvular abnormality.  Lexiscan Sestamibi stress test 07/22/2018:  1. Lexiscan stress test was performed. Exercise capacity was not assessed. Stress symptoms included chest pain, dyspnea, nausea, dizziness. Peak blood pressure was 148/98 mmHg. The resting and stress electrocardiogram demonstrated normal sinus rhythm, normal resting conduction, no resting arrhythmias and normal rest repolarization.  Stress EKG is non diagnostic for ischemia as it is a pharmacologic stress.  2. The overall quality of the study is good.  Left ventricular cavity is noted to be normal on  the rest and stress studies.  Gated SPECT images reveal normal myocardial thickening and wall motion.  The left ventricular ejection fraction was calculated 45%, although visually appears normal. SPECT images reveal small area of mild intensity, reversible perfusion defect in apical inferolateral myocardium, that may represent ischemia.  3. Intermediate risk study due to small perfusion defect and mildly reduced LVEF. Recommend correlation with echocardiogram.  Assessment   Coronary artery disease of native artery of native heart with stable angina pectoris ([place]) 10/07/18: Patent Prox RCA ORSIRO DES 4.0X30   Essential hypertension  Dysarthria  EKG 05/31/2019: Normal sinus rhythm at 79 bpm, normal axis, no evidence of ischemia.   Recommendations:   Patient with CAD presenting with neurological deficits with right-sided arm and leg weakness and dysarthria.  No focal deficits are noted today and her symptoms have overall been stable.  She has pretty significant neurological deficits that are being extensively evaluated by neurology for potential CVA that is MRI negative versus seizure.  In regard to CAD, patient has been stable.  She has had improvement in symptoms of angina since being out of work due to her neurological deficits.  Because of her significant neurological deficits, do feel that she should continue to be out of work to fully recover and for extensive work-up with neurology.  Would also support being out of work as her symptoms of angina have improved with avoiding work-related stress.  Blood pressure is elevated today, but is closely monitored by her at home and is very well controlled.  She states she missed taking one of her medications this morning as she woke up late.  She will continue with home monitoring.  She will be closely followed by her  PCP as well as her neurologist for continued work-up for her symptoms.  She is also working with speech therapy and hopefully will be able  to regain some improvement in her speech.  We will plan to see her back in 6 months or sooner if needed.   *I have discussed this case with Dr. Einar Gip and he personally examined the patient and participated in formulating the plan.*   Miquel Dunn, MSN, APRN, FNP-C [facility]. [place] Office: [phone] Fax: [phone]

## 2019-06-27 ENCOUNTER — Ambulatory Visit (HOSPITAL_COMMUNITY): Payer: BC Managed Care – PPO | Admitting: Speech Pathology

## 2019-06-27 ENCOUNTER — Encounter (HOSPITAL_COMMUNITY): Payer: Self-pay | Admitting: Speech Pathology

## 2019-06-27 ENCOUNTER — Other Ambulatory Visit: Payer: Self-pay

## 2019-06-27 DIAGNOSIS — R41841 Cognitive communication deficit: Secondary | ICD-10-CM | POA: Diagnosis not present

## 2019-06-27 NOTE — Therapy (Signed)
[place], [place], [address] Phone: [phone]   Fax:  [phone]  Speech Language Pathology Treatment  Patient Details  Name: Tonya Boyd MRN: [id number] Date of Birth: 12-13-1955 Referring Provider (SLP): Fernande Bras, MD   Encounter Date: 06/27/2019  End of Session - 06/27/19 1830    Visit Number  2    Number of Visits  9    Date for SLP Re-Evaluation  07/27/19    Authorization Type  [employer] PPO    SLP Start Time  1435    SLP Stop Time   3244    SLP Time Calculation (min)  42 min    Activity Tolerance  Patient tolerated treatment well       Past Medical History:  Diagnosis Date  . Angina pectoris ([place])   . Asthma    "as a child and as an adult" (08/02/2018)  . CAD (coronary artery disease), native coronary artery 08/03/2018  . Chronic bronchitis ([place])   . Chronic kidney disease   . Coronary artery disease   . Family history of adverse reaction to anesthesia    "cousin stopped breathing; he was allergic to the anesthesia" (08/02/2018)  . GERD (gastroesophageal reflux disease)   . High cholesterol   . History of gout   . History of hiatal hernia   . History of kidney stones   . Hypertension 07/2018  . Interstitial cystitis   . NSVT (nonsustained ventricular tachycardia) ([place]) 07/2018  . Raynaud's disease    "feet and hands" (08/02/2018)  . Raynaud's disease without gangrene 10/15/2018    Past Surgical History:  Procedure Laterality Date  . ABDOMINAL HYSTERECTOMY    . ANTERIOR CERVICAL DECOMP/DISCECTOMY FUSION    . APPENDECTOMY    . AUGMENTATION MAMMAPLASTY Bilateral   . BACK SURGERY    . CORONARY ANGIOPLASTY WITH STENT PLACEMENT  08/02/2018  . CORONARY STENT INTERVENTION N/A 08/02/2018   Procedure: CORONARY STENT INTERVENTION;  Surgeon: Adrian Prows, MD;  Location: [facility];  Service: Cardiovascular;  Laterality: N/A;  RCA   . CYSTOSCOPY W/ STONE MANIPULATION    . INTRAVASCULAR  PRESSURE WIRE/FFR STUDY N/A 10/07/2018   Procedure: INTRAVASCULAR PRESSURE WIRE/FFR STUDY;  Surgeon: Adrian Prows, MD;  Location: [facility];  Service: Cardiovascular;  Laterality: N/A;  . KNEE ARTHROSCOPY Right   . LAPAROSCOPIC CHOLECYSTECTOMY    . LEFT HEART CATH AND CORONARY ANGIOGRAPHY N/A 10/07/2018   Procedure: LEFT HEART CATH AND CORONARY ANGIOGRAPHY;  Surgeon: Adrian Prows, MD;  Location: [facility];  Service: Cardiovascular;  Laterality: N/A;  . REPAIR ANKLE LIGAMENT Left    "tied up ligament; had tore up qthing in my ankle"  . RIGHT HEART CATH  10/07/2018  . RIGHT/LEFT HEART CATH AND CORONARY ANGIOGRAPHY N/A 08/02/2018   Procedure: RIGHT/LEFT HEART CATH AND CORONARY ANGIOGRAPHY;  Surgeon: Adrian Prows, MD;  Location: [facility];  Service: Cardiovascular;  Laterality: N/A;  . SHOULDER SURGERY     bilateral  . TONSILLECTOMY    . TUBAL LIGATION      There were no vitals filed for this visit.  Subjective Assessment - 06/27/19 1828    Subjective  "It no better."    Currently in Pain?  No/denies        ADULT SLP TREATMENT - 06/27/19 1828      General Information   Behavior/Cognition  Alert;Cooperative;Pleasant mood    Patient Positioning  Upright in chair  Oral care provided  N/A    HPI  60 year patient with speech difficulty, right sided weakness and numbness from stroke like episode of unclear etiology. MRI brain x 2 negative for stroke. 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 Exam shows nonorganic features.No obvious underlying psychosocial stressors identified.       Treatment Provided   Treatment provided  Cognitive-Linquistic      Pain Assessment   Pain Assessment  No/denies pain      Cognitive-Linquistic Treatment   Treatment focused on  Aphasia;Apraxia;Patient/family/caregiver education    Skilled Treatment  SLP provided skilled SLP treatment targeting articulatory and grammatical errors in structured tasks and  conversational speech. The [employer] short form was also administered to further assess naming. See below.     Assessment / Recommendations / Plan   Plan  Continue with current plan of care      Progression Toward Goals   Progression toward goals  Progressing toward goals          06/27/19 1653  SLP SHORT TERM GOAL #1  Title Pt will produce s-blends in the initial position of words with 90% acc when labeling pictures with min cues for error awareness.  Baseline 25%  Time 4  Period Weeks  Status New  Target Date 07/27/19  SLP SHORT TERM GOAL #2  Title Pt will imitate 7+ word sentences with 95% acc with cue for error awareness for proper grammar/syntax and allowance for articulatory errors.  Baseline Pt substitutes me/I, is/be, etc; 75%  Time 4  Period Weeks  Status New  Target Date 07/27/19  SLP SHORT TERM GOAL #3  Title Pt will describe pictures by generating sentences of 5+ words using proper grammar and syntax with 90% acc and min cues.  Baseline 70% acc  Time 4  Period Weeks  Status New  Target Date 07/27/19  SLP SHORT TERM GOAL #4  Title Pt will complete functional working memory activities with 90% acc and use of memory strategies when provided mi/mod cues.  Baseline 25% no assist  Time 4  Period Weeks  Status New  Target Date 07/27/19  SLP SHORT TERM GOAL #5  Title Continue ongoing assessment of speech changes related to grammar, syntax, and articulatory errors over the next few sessions.  Time 4  Period Weeks  Status New  Target Date 07/27/19    SLP Long Term Goals - 06/27/19 1837      SLP LONG TERM GOAL #1   Title  Same as short term goals       Plan - 06/27/19 1831    Clinical Impression Statement Pt with improvement in error awareness and attempts to self correct grammatical errors in conversation. She produced s-blends with 85% acc with min cues in oral reading of single words. The [employer] short form was administered with some  semantic errors (platypus for beaver, telescope for stethoscope) and articulatory errors. Pt exhibits an atypical speech sound pattern with difficulty with s-blends, final sound omissions, stopping, and fronting. Plan to continue targeting speech goals.   Speech Therapy Frequency  2x / week    Duration  4 weeks    Treatment/Interventions  Cueing hierarchy;SLP instruction and feedback;Compensatory techniques;Compensatory strategies;Functional tasks;Patient/family education;Multimodal communcation approach    Potential to Achieve Goals  Good    SLP Home Exercise Plan  Pt will complete HEP as assigned to facilitate carryover of treatment strategies and techniques in home environment independently.    Consulted and Agree  with Plan of Care  Patient       Patient will benefit from skilled therapeutic intervention in order to improve the following deficits and impairments:   Cognitive communication deficit    Problem List Patient Active Problem List   Diagnosis Date Noted  . Raynaud's disease without gangrene 10/15/2018  . Angina pectoris ([facility]) 10/07/2018  . Essential hypertension 08/03/2018  . CAD (coronary artery disease), native coronary artery 08/03/2018  . Post PTCA 08/02/2018  . Elevated IgE level 07/19/2018  . Dyspnea on exertion 07/19/2018  . Long-term exposure involving bird droppings 07/19/2018  . Iliotibial band syndrome of left side 10/21/2011   Thank you,  Havery MorosDabney Porter, CCC-SLP [phone]  [facility] CampusOR[facility],DABNEY 06/27/2019, 6:37 PM  [facility] [address] [place], [place], [address] Phone: [phone]   Fax:  [phone]   Name: Drucie Ipatricia A Davoli MRN: [id number] Date of Birth: 08/02/1956

## 2019-06-28 ENCOUNTER — Ambulatory Visit (HOSPITAL_COMMUNITY): Payer: BC Managed Care – PPO | Admitting: Physical Therapy

## 2019-06-28 ENCOUNTER — Encounter (HOSPITAL_COMMUNITY): Payer: Self-pay | Admitting: Physical Therapy

## 2019-06-28 ENCOUNTER — Ambulatory Visit (HOSPITAL_COMMUNITY): Payer: BC Managed Care – PPO | Admitting: Occupational Therapy

## 2019-06-28 ENCOUNTER — Other Ambulatory Visit: Payer: Self-pay

## 2019-06-28 ENCOUNTER — Encounter (HOSPITAL_COMMUNITY): Payer: Self-pay | Admitting: Occupational Therapy

## 2019-06-28 DIAGNOSIS — M6281 Muscle weakness (generalized): Secondary | ICD-10-CM

## 2019-06-28 DIAGNOSIS — R278 Other lack of coordination: Secondary | ICD-10-CM

## 2019-06-28 DIAGNOSIS — R41841 Cognitive communication deficit: Secondary | ICD-10-CM | POA: Diagnosis not present

## 2019-06-28 DIAGNOSIS — R2689 Other abnormalities of gait and mobility: Secondary | ICD-10-CM

## 2019-06-28 DIAGNOSIS — M79601 Pain in right arm: Secondary | ICD-10-CM

## 2019-06-28 DIAGNOSIS — R29898 Other symptoms and signs involving the musculoskeletal system: Secondary | ICD-10-CM

## 2019-06-28 NOTE — Therapy (Signed)
[facility] [address] [place], [place], [address] Phone: [phone]   Fax:  [phone]  Physical Therapy Evaluation  Patient Details  Name: Tonya Boyd MRN: [id number] Date of Birth: 09/03/1956 Referring Provider (PT): Micki RileySethi, Pramod S, MD   Encounter Date: 06/28/2019  PT End of Session - 06/28/19 1531    Visit Number  1    Number of Visits  12    Date for PT Re-Evaluation  08/09/19   Mini reassess on 07/19/19   Authorization Time Period  06/28/19-08/09/19    PT Start Time  1430    PT Stop Time  1515    PT Time Calculation (min)  45 min    Equipment Utilized During Treatment  Gait belt    Activity Tolerance  Patient tolerated treatment well    Behavior During Therapy  [facility] for tasks assessed/performed       Past Medical History:  Diagnosis Date  . Angina pectoris (HCC)   . Asthma    "as a child and as an adult" (08/02/2018)  . CAD (coronary artery disease), native coronary artery 08/03/2018  . Chronic bronchitis (HCC)   . Chronic kidney disease   . Coronary artery disease   . Family history of adverse reaction to anesthesia    "cousin stopped breathing; he was allergic to the anesthesia" (08/02/2018)  . GERD (gastroesophageal reflux disease)   . High cholesterol   . History of gout   . History of hiatal hernia   . History of kidney stones   . Hypertension 07/2018  . Interstitial cystitis   . NSVT (nonsustained ventricular tachycardia) (HCC) 07/2018  . Raynaud's disease    "feet and hands" (08/02/2018)  . Raynaud's disease without gangrene 10/15/2018    Past Surgical History:  Procedure Laterality Date  . ABDOMINAL HYSTERECTOMY    . ANTERIOR CERVICAL DECOMP/DISCECTOMY FUSION    . APPENDECTOMY    . AUGMENTATION MAMMAPLASTY Bilateral   . BACK SURGERY    . CORONARY ANGIOPLASTY WITH STENT PLACEMENT  08/02/2018  . CORONARY STENT INTERVENTION N/A 08/02/2018   Procedure: CORONARY STENT INTERVENTION;  Surgeon:  Yates DecampGanji, Jay, MD;  [facility];  Service: Cardiovascular;  Laterality: N/A;  RCA   . CYSTOSCOPY W/ STONE MANIPULATION    . INTRAVASCULAR PRESSURE WIRE/FFR STUDY N/A 10/07/2018   Procedure: INTRAVASCULAR PRESSURE WIRE/FFR STUDY;  Surgeon: Yates DecampGanji, Jay, MD;  Location: [facility];  Service: Cardiovascular;  Laterality: N/A;  . KNEE ARTHROSCOPY Right   . LAPAROSCOPIC CHOLECYSTECTOMY    . LEFT HEART CATH AND CORONARY ANGIOGRAPHY N/A 10/07/2018   Procedure: LEFT HEART CATH AND CORONARY ANGIOGRAPHY;  Surgeon: Yates DecampGanji, Jay, MD;  [facility]: [facility];  Service: Cardiovascular;  Laterality: N/A;  . REPAIR ANKLE LIGAMENT Left    "tied up ligament; had tore up qthing in my ankle"  . RIGHT HEART CATH  10/07/2018  . RIGHT/LEFT HEART CATH AND CORONARY ANGIOGRAPHY N/A 08/02/2018   Procedure: RIGHT/LEFT HEART CATH AND CORONARY ANGIOGRAPHY;  Surgeon: Yates DecampGanji, Jay, MD;  [facility]: [facility];  Service: Cardiovascular;  Laterality: N/A;  . SHOULDER SURGERY     bilateral  . TONSILLECTOMY    . TUBAL LIGATION      There were no vitals filed for this visit.   Subjective Assessment - 06/28/19 1426    Subjective  Patient presents to physical therapy with complaint of bilateral leg pain and RLE weakness. Patient states sometime around 10/1 she believes  she had a stroke like episode. Patient says she noticed difficulty in her speech, onset of RT arm pain, and RT leg weakness. Patient says her RT leg feels heavy, and that her LT knee started hurting as a result of compensating for decreased weightbearing through her RLE.  Patient says she can tell she is "walking funny". Patient has had imaging including an MRI, and results were negative. Patient says that her Doctor believes that she had a complex stroke that did not show up on imaging.    Pertinent History  Cardiac stent 2019, HTN, Angina    Limitations  Standing;Walking;House hold activities;Other (comment)   stairs   How long can you  stand comfortably?  10 minutes    How long can you walk comfortably?  Not sure    Diagnostic tests  MRI    Patient Stated Goals  "Have less pain, walk better"    Currently in Pain?  Yes    Pain Score  4     Pain Location  Leg    Pain Orientation  Right;Lateral    Pain Descriptors / Indicators  Numbness    Pain Type  Neuropathic pain    Pain Radiating Towards  RT lateral foot    Pain Onset  1 to 4 weeks ago    Pain Frequency  Intermittent    Aggravating Factors   Walking, standing    Pain Relieving Factors  rest    Effect of Pain on Daily Activities  Limiting    Multiple Pain Sites  Yes    Pain Score  5    Pain Location  Knee    Pain Orientation  Left;Anterior    Pain Descriptors / Indicators  Aching    Pain Type  Acute pain    Pain Onset  1 to 4 weeks ago    Pain Frequency  Intermittent    Aggravating Factors   walking, standing    Pain Relieving Factors  rest    Effect of Pain on Daily Activities  Limiting         [facility] PT Assessment - 06/28/19 1450      Assessment   Medical Diagnosis  stroke-like episode    Onset Date/Surgical Date  06/08/19    Prior Therapy  None      Precautions   Precautions  None      Restrictions   Weight Bearing Restrictions  No      Prior Function   Level of Independence  Independent    Vocation  Full time employment   Currently on FMLA     Cognition   Overall Cognitive Status  Within Functional Limits for tasks assessed      Sensation   Light Touch  Impaired Detail   Hypersensitivity noted throughout RLE      ROM / Strength   AROM / PROM / Strength  AROM;Strength      AROM   Overall AROM Comments  Bilateral hip and knee AROM WFL    AROM Assessment Site  Hip;Knee    Right/Left Hip  Right;Left      Strength   Strength Assessment Site  Hip;Knee;Ankle    Right/Left Hip  Right;Left    Right Hip Flexion  3+/5   Noted shaking   Right Hip Extension  3-/5   Pain in Hamstring   Right Hip ABduction  3/5    Right Hip ADduction   3-/5 "feels heavy"   Left Hip Flexion  4+/5  Left Hip Extension  3-/5   pain in knee   Left Hip ABduction  4-/5   pain in knee   Right/Left Knee  Right;Left    Right Knee Flexion  3+/5   Shaking, uncoordinated movement    Right Knee Extension  3/5   Shaking, uncoordinated movement    Left Knee Flexion  5/5    Left Knee Extension  5/5    Right/Left Ankle  Right;Left    Right Ankle Dorsiflexion  5/5    Left Ankle Dorsiflexion  5/5      Palpation   Palpation comment  Mod tenderness to palpation noted about LT anterior knee joint      Ambulation/Gait   Ambulation/Gait  Yes    Ambulation/Gait Assistance  5: Supervision    Ambulation Distance (Feet)  260 Feet    Assistive device  None    Gait Pattern  Decreased step length - left;Decreased weight shift to left;Decreased stance time - left    Ambulation Surface  Level    Gait velocity  0.34m/s    Gait Comments       Balance   Balance Assessed  Yes      Static Standing Balance   Static Standing Balance -  Activities   Single Leg Stance - Right Leg;Tandam Stance - Right Leg;Single Leg Stance - Left Leg;Tandam Stance - Left Leg    Static Standing - Comment/# of Minutes  unable; 8 seconds mod sway; unable; unable                 Objective measurements completed on examination: See above findings.              PT Education - 06/28/19 1530    Education Details  Patient educated on findings of assessment, prognosis and POC    Person(s) Educated  Patient    Methods  Explanation    Comprehension  Verbalized understanding       PT Short Term Goals - 06/28/19 1539      PT SHORT TERM GOAL #1   Title  Patient will be IND with initial HEP to improve funcitonal outcomes    Time  3    Period  Weeks    Status  New    Target Date  07/19/19      PT SHORT TERM GOAL #2   Title  Patient will have pain in LT knee no greater than 3/10 on average to improve funcitonal mobility    Time  3    Period  Weeks     Status  New    Target Date  07/19/19      PT SHORT TERM GOAL #3   Title  Patient will improve RLE MMT by 1/2 grade to reduce pain and improve functional mobility    Time  3    Period  Weeks    Status  New    Target Date  07/19/19        PT Long Term Goals - 06/28/19 1542      PT LONG TERM GOAL #1   Title  Patient will improve RLE MMT to greater than or equal to 4/5 throuhout to improve funcional mobility and standing tolerance    Time  6    Period  Weeks    Status  New    Target Date  08/09/19      PT LONG TERM GOAL #2   Title  Patient will be able to perform SLS >  or equal to 10 seconds on BLE to improve funcitonal mobility and reduce risk of falls    Time  6    Period  Weeks    Status  New    Target Date  08/09/19      PT LONG TERM GOAL #3   Title  Patient will be able to walk >30 minutes IND with no increase in pain level to improve funcitonal mobility and ADLs    Time  6    Period  Weeks    Status  New    Target Date  08/09/19             Plan - 06/28/19 1532    Clinical Impression Statement  Patient is a 63 year old female who presents to physical therapy with complaint of BLE pain and RLE weakness. Patient demos decreased strength, sensation deficits, increased tenderness to palpation, balance deficits and gait abnormalities which are likely contributing to sx of pain and are negatively impacting ability to perform ADLs and functional mobility tasks. Patient will benefit from skilled physical therapy to address these deficits to reduce pain and improve level of function with ADLs and functional mobility tasks.    Personal Factors and Comorbidities  Comorbidity 3+    Comorbidities  HTN, cardiac stent, angina    Examination-Activity Limitations  Locomotion Level;Lift;Transfers;Stand;Stairs;Squat    Examination-Participation Restrictions  Cleaning;Yard Work;Laundry;Community Activity    Stability/Clinical Decision Making  Evolving/Moderate complexity    Clinical  Decision Making  Moderate    Rehab Potential  Good    PT Frequency  2x / week    PT Duration  6 weeks    PT Treatment/Interventions  ADLs/Self Care Home Management;Aquatic Therapy;Biofeedback;Cryotherapy;Electrical Stimulation;Moist Heat;Ultrasound;DME Instruction;Gait training;Stair training;Functional mobility training;Therapeutic activities;Therapeutic exercise;Balance training;Manual techniques;Orthotic Fit/Training;Patient/family education;Neuromuscular re-education;Passive range of motion;Joint Manipulations;Taping;Vasopneumatic Device    PT Next Visit Plan  Initiate ther ex program.  Add supine bridge, clamshells, and SLR to HEP. Add Standing balance (NBOS, tandem), heel raises, standing hip abd/ ext, step ups fwd/ lateral    Consulted and Agree with Plan of Care  Patient       Patient will benefit from skilled therapeutic intervention in order to improve the following deficits and impairments:  Abnormal gait, Decreased endurance, Impaired sensation, Decreased activity tolerance, Decreased strength, Pain, Difficulty walking, Decreased mobility, Decreased balance, Decreased coordination, Improper body mechanics  Visit Diagnosis: Muscle weakness (generalized)  Other abnormalities of gait and mobility     Problem List Patient Active Problem List   Diagnosis Date Noted  . Raynaud's disease without gangrene 10/15/2018  . Angina pectoris (HCC) 10/07/2018  . Essential hypertension 08/03/2018  . CAD (coronary artery disease), native coronary artery 08/03/2018  . Post PTCA 08/02/2018  . Elevated IgE level 07/19/2018  . Dyspnea on exertion 07/19/2018  . Long-term exposure involving bird droppings 07/19/2018  . Iliotibial band syndrome of left side 10/21/2011    Ronita Hipps PT DPT 06/28/2019, 5:59 PM  [facility] [address] [place], [place], [address] Phone: [phone]   Fax:  [phone]  Name: Tonya Boyd MRN:  [id number] Date of Birth: 1956-03-23

## 2019-06-28 NOTE — Patient Instructions (Signed)
Home Exercises Program Theraputty Exercises  Do the following exercises 2 times a day using your affected hand.  1. Roll putty into a ball.  2. Make into a pancake.  3. Roll putty into a roll.  4. Pinch along log with first finger and thumb.   5. Make into a ball.  6. Roll it back into a log.   7. Pinch using thumb and side of first finger.  8. Roll into a ball, then flatten into a pancake.  9. Using your fingers, make putty into a mountain.   

## 2019-06-28 NOTE — Therapy (Signed)
[facility] [address] [place], [place], [address] Phone: [phone]   Fax:  [phone]  Occupational Therapy Evaluation  Patient Details  Name: Tonya Boyd MRN: [id number] Date of Birth: 07/09/1956 Referring Provider (OT): Dr. Delia HeadyPramod Sethi   Encounter Date: 06/28/2019  OT End of Session - 06/28/19 1637    Visit Number  1    Number of Visits  12    Date for OT Re-Evaluation  08/09/19    Authorization Type  [employer] PPO    OT Start Time  1347    OT Stop Time  1423    OT Time Calculation (min)  36 min    Activity Tolerance  Patient tolerated treatment well    Behavior During Therapy  [facility] for tasks assessed/performed       Past Medical History:  Diagnosis Date  . Angina pectoris (HCC)   . Asthma    "as a child and as an adult" (08/02/2018)  . CAD (coronary artery disease), native coronary artery 08/03/2018  . Chronic bronchitis (HCC)   . Chronic kidney disease   . Coronary artery disease   . Family history of adverse reaction to anesthesia    "cousin stopped breathing; he was allergic to the anesthesia" (08/02/2018)  . GERD (gastroesophageal reflux disease)   . High cholesterol   . History of gout   . History of hiatal hernia   . History of kidney stones   . Hypertension 07/2018  . Interstitial cystitis   . NSVT (nonsustained ventricular tachycardia) (HCC) 07/2018  . Raynaud's disease    "feet and hands" (08/02/2018)  . Raynaud's disease without gangrene 10/15/2018    Past Surgical History:  Procedure Laterality Date  . ABDOMINAL HYSTERECTOMY    . ANTERIOR CERVICAL DECOMP/DISCECTOMY FUSION    . APPENDECTOMY    . AUGMENTATION MAMMAPLASTY Bilateral   . BACK SURGERY    . CORONARY ANGIOPLASTY WITH STENT PLACEMENT  08/02/2018  . CORONARY STENT INTERVENTION N/A 08/02/2018   Procedure: CORONARY STENT INTERVENTION;  Surgeon: Yates DecampGanji, Jay, MD;  [facility];  Service: Cardiovascular;  Laterality: N/A;   RCA   . CYSTOSCOPY W/ STONE MANIPULATION    . INTRAVASCULAR PRESSURE WIRE/FFR STUDY N/A 10/07/2018   Procedure: INTRAVASCULAR PRESSURE WIRE/FFR STUDY;  Surgeon: Yates DecampGanji, Jay, MD;  [facility]: [facility];  Service: Cardiovascular;  Laterality: N/A;  . KNEE ARTHROSCOPY Right   . LAPAROSCOPIC CHOLECYSTECTOMY    . LEFT HEART CATH AND CORONARY ANGIOGRAPHY N/A 10/07/2018   Procedure: LEFT HEART CATH AND CORONARY ANGIOGRAPHY;  Surgeon: Yates DecampGanji, Jay, MD;  [facility];  Service: Cardiovascular;  Laterality: N/A;  . REPAIR ANKLE LIGAMENT Left    "tied up ligament; had tore up qthing in my ankle"  . RIGHT HEART CATH  10/07/2018  . RIGHT/LEFT HEART CATH AND CORONARY ANGIOGRAPHY N/A 08/02/2018   Procedure: RIGHT/LEFT HEART CATH AND CORONARY ANGIOGRAPHY;  Surgeon: Yates DecampGanji, Jay, MD;  [facility]: [facility];  Service: Cardiovascular;  Laterality: N/A;  . SHOULDER SURGERY     bilateral  . TONSILLECTOMY    . TUBAL LIGATION      There were no vitals filed for this visit.  Subjective Assessment - 06/28/19 1632    Subjective   S: I can't do much with my right arm.    Pertinent History  Pt is a 63 y/o female presenting with stroke-like symptoms that began on 06/09/2019. Pt has had 2 MRIs which were  negative for acute infarct, however symptoms have persisted. Pt was referred to occupational therapy for evaluation and treatment by Dr. Delia Heady.    Patient Stated Goals  To be able to use my right arm during my everyday tasks.    Currently in Pain?  Yes    Pain Score  2     Pain Location  Arm    Pain Orientation  Right    Pain Descriptors / Indicators  Sore    Pain Type  Neuropathic pain    Pain Radiating Towards  n/a    Pain Onset  1 to 4 weeks ago    Pain Frequency  Intermittent    Aggravating Factors   use    Pain Relieving Factors  rest    Effect of Pain on Daily Activities  mod effect on ADLs    Multiple Pain Sites  No        [facility] OT Assessment - 06/28/19 1346       Assessment   Medical Diagnosis  stroke-like episode    Referring Provider (OT)  Dr. Delia Heady    Onset Date/Surgical Date  06/08/19    Hand Dominance  Left    Next MD Visit  unsure    Prior Therapy  None      Precautions   Precautions  None      Restrictions   Weight Bearing Restrictions  No      Balance Screen   Has the patient fallen in the past 6 months  No    Has the patient had a decrease in activity level because of a fear of falling?   No    Is the patient reluctant to leave their home because of a fear of falling?   No      Prior Function   Level of Independence  Independent    Vocation  Full time employment    Vocation Requirements  Referrals-MD office    Leisure  watching TV, caring for dog and bird      ADL   ADL comments  Pt is having difficulty with washing and fixing hair, cooking, maintaining hold on objects, sleeping. Pt is unable to reach items over her head or take items on/off shelves.        Written Expression   Dominant Hand  Left      Cognition   Overall Cognitive Status  Impaired/Different from baseline      Sensation   Semmes Weinstein Monofilament Scale  Diminished Protective   dorsal and volar hands; forearm is at 2.83     Coordination   9 Hole Peg Test  Right;Left    Left 9 Hole Peg Test  24.32"    Box and Blocks  32.06"      ROM / Strength   AROM / PROM / Strength  Strength      Strength   Overall Strength Comments  Assessed seated, shoulder er/IR abducted    Strength Assessment Site  Shoulder;Elbow;Forearm;Wrist;Hand    Right/Left Shoulder  Right    Right Shoulder Flexion  4-/5    Right Shoulder ABduction  4-/5    Right Shoulder Internal Rotation  4-/5    Right Shoulder External Rotation  3+/5    Right/Left Elbow  Right    Right Elbow Flexion  3/5    Right Elbow Extension  3-/5    Right/Left Forearm  Right    Right Forearm Pronation  3/5    Right Forearm Supination  3/5    Right/Left Wrist  Right    Right Wrist Flexion  4-/5     Right Wrist Extension  3/5    Right Wrist Radial Deviation  3/5    Right Wrist Ulnar Deviation  3/5    Right/Left hand  Right;Left    Right Hand Gross Grasp  Functional    Right Hand Grip (lbs)  35    Right Hand Lateral Pinch  7 lbs    Right Hand 3 Point Pinch  6 lbs    Left Hand Gross Grasp  Functional    Left Hand Grip (lbs)  55    Left Hand Lateral Pinch  14 lbs    Left Hand 3 Point Pinch  10 lbs                      OT Education - 06/28/19 1414    Education Details  red theraputty    Person(s) Educated  Patient    Methods  Explanation;Demonstration;Handout    Comprehension  Verbalized understanding;Returned demonstration       OT Short Term Goals - 06/28/19 1643      OT SHORT TERM GOAL #1   Title  Pt will be provided with and educated on HEP to improve RUE use as non-dominant during functional tasks.    Time  3    Period  Weeks    Status  New    Target Date  08/09/19      OT SHORT TERM GOAL #2   Title  Pt will improve right grip strength by 8# and pinch strength by 3# to improve ability to hold dishes when washing.    Time  3    Period  Weeks    Status  New        OT Long Term Goals - 06/28/19 1645      OT LONG TERM GOAL #1   Title  Pt will increase RUE strength to 4+/5 to improve ability to lift items into overhead cabinets safely using RUE as assist.    Time  6    Period  Weeks    Status  New      OT LONG TERM GOAL #2   Title  Pt will increase right grip strength by 15# and pinch strength by 6# to increase ability to carry bags or handheld items during ADLs.    Time  6    Period  Weeks    Status  New      OT LONG TERM GOAL #3   Title  Pt will improve fine motor coordination by completing 9 hole peg test in under 26" to improve skills required for fixing hair.    Time  6    Period  Weeks    Status  New      OT LONG TERM GOAL #4   Title  Pt will decrease pain in RUE to 3/10 or less to improve ability to sleep at night.    Time  6     Period  Weeks    Status  New            Plan - 06/28/19 1637    Clinical Impression Statement  A: Pt is a 63 y/o female presenting for evaluation of RUE weakness and coordination difficulties after experiencing persistent stroke-like symptoms beginning on 06/08/19. Pt with inconsistent strength during MMT, going from functional to poor muscle activation/strength quickly and repetitively. Pt with improved sustained shoulder  strength with elbow flexed during MMT.    OT Occupational Profile and History  Detailed Assessment- Review of Records and additional review of physical, cognitive, psychosocial history related to current functional performance    Occupational performance deficits (Please refer to evaluation for details):  ADL's;IADL's;Rest and Sleep;Work;Leisure    Body Structure / Function / Physical Skills  ADL;UE functional use;Pain;FMC;GMC;Sensation;IADL;Strength    Rehab Potential  Good    Clinical Decision Making  Limited treatment options, no task modification necessary    Comorbidities Affecting Occupational Performance:  None    Modification or Assistance to Complete Evaluation   No modification of tasks or assist necessary to complete eval    OT Frequency  2x / week    OT Duration  6 weeks    OT Treatment/Interventions  Self-care/ADL training;Patient/family education;Passive range of motion;Cryotherapy;Electrical Stimulation;Moist Heat;Therapeutic exercise;Therapeutic activities    Plan  P: Pt will benefit from skilled OT services to improve RUE strength and coordination, and improve functional use of RUE during ADLs. Treatment plan: general RUE strengthening, grip and pinch strengthening, fine motor coordination tasks, functional task completion    Consulted and Agree with Plan of Care  Patient       Patient will benefit from skilled therapeutic intervention in order to improve the following deficits and impairments:   Body Structure / Function / Physical Skills: ADL, UE  functional use, Pain, FMC, GMC, Sensation, IADL, Strength       Visit Diagnosis: Other symptoms and signs involving the musculoskeletal system  Other lack of coordination  Pain in right arm    Problem List Patient Active Problem List   Diagnosis Date Noted  . Raynaud's disease without gangrene 10/15/2018  . Angina pectoris (HCC) 10/07/2018  . Essential hypertension 08/03/2018  . CAD (coronary artery disease), native coronary artery 08/03/2018  . Post PTCA 08/02/2018  . Elevated IgE level 07/19/2018  . Dyspnea on exertion 07/19/2018  . Long-term exposure involving bird droppings 07/19/2018  . Iliotibial band syndrome of left side 10/21/2011   Ezra Sites, OTR/L  [phone] 06/28/2019, 4:48 PM  [facility] [address] [place], [place], [address] Phone: [phone]   Fax:  [phone]  Name: Tonya Boyd MRN: [id number] Date of Birth: 07/01/1956

## 2019-06-29 ENCOUNTER — Telehealth (HOSPITAL_COMMUNITY): Payer: Self-pay

## 2019-06-29 NOTE — Telephone Encounter (Signed)
Pt called to confirm her apptment tomorrow at 4pm, I confirmed the ph number with pt b/c coworker could not get her on the ph today. CW called 3xs. ot did not answer

## 2019-06-30 ENCOUNTER — Other Ambulatory Visit: Payer: Self-pay

## 2019-06-30 ENCOUNTER — Encounter (HOSPITAL_COMMUNITY): Payer: Self-pay

## 2019-06-30 ENCOUNTER — Ambulatory Visit (HOSPITAL_COMMUNITY): Payer: BC Managed Care – PPO

## 2019-06-30 ENCOUNTER — Encounter

## 2019-06-30 DIAGNOSIS — M6281 Muscle weakness (generalized): Secondary | ICD-10-CM

## 2019-06-30 DIAGNOSIS — R2689 Other abnormalities of gait and mobility: Secondary | ICD-10-CM

## 2019-06-30 DIAGNOSIS — R41841 Cognitive communication deficit: Secondary | ICD-10-CM | POA: Diagnosis not present

## 2019-06-30 NOTE — Therapy (Signed)
[place], [place], [address] Phone: [phone]   Fax:  [phone]  Physical Therapy Treatment  Patient Details  Name: Tonya Boyd MRN: [id number] Date of Birth: 1956/01/24 Referring Provider (PT): Garvin Fila, MD   Encounter Date: 06/30/2019  PT End of Session - 06/30/19 1616    Visit Number  2    Number of Visits  12    Date for PT Re-Evaluation  08/09/19   Minireassess 07/19/19   Authorization Type  BCBS MET dedcutible and OOPvisits are combined with PT/OT/Chiro - 30 total per benefit period    Authorization Time Period  06/28/19-08/09/19    Authorization - Visit Number  2    Authorization - Number of Visits  12    PT Start Time  1696    PT Stop Time  1658    PT Time Calculation (min)  43 min    Equipment Utilized During Treatment  Gait belt    Activity Tolerance  Patient tolerated treatment well    Behavior During Therapy  WFL for tasks assessed/performed       Past Medical History:  Diagnosis Date  . Angina pectoris ([place])   . Asthma    "as a child and as an adult" (08/02/2018)  . CAD (coronary artery disease), native coronary artery 08/03/2018  . Chronic bronchitis ([place])   . Chronic kidney disease   . Coronary artery disease   . Family history of adverse reaction to anesthesia    "cousin stopped breathing; he was allergic to the anesthesia" (08/02/2018)  . GERD (gastroesophageal reflux disease)   . High cholesterol   . History of gout   . History of hiatal hernia   . History of kidney stones   . Hypertension 07/2018  . Interstitial cystitis   . NSVT (nonsustained ventricular tachycardia) ([place]) 07/2018  . Raynaud's disease    "feet and hands" (08/02/2018)  . Raynaud's disease without gangrene 10/15/2018    Past Surgical History:  Procedure Laterality Date  . ABDOMINAL HYSTERECTOMY    . ANTERIOR CERVICAL DECOMP/DISCECTOMY FUSION    . APPENDECTOMY    . AUGMENTATION MAMMAPLASTY  Bilateral   . BACK SURGERY    . CORONARY ANGIOPLASTY WITH STENT PLACEMENT  08/02/2018  . CORONARY STENT INTERVENTION N/A 08/02/2018   Procedure: CORONARY STENT INTERVENTION;  Surgeon: Adrian Prows, MD;  Location: [facility];  Service: Cardiovascular;  Laterality: N/A;  RCA   . CYSTOSCOPY W/ STONE MANIPULATION    . INTRAVASCULAR PRESSURE WIRE/FFR STUDY N/A 10/07/2018   Procedure: INTRAVASCULAR PRESSURE WIRE/FFR STUDY;  Surgeon: Adrian Prows, MD;  Location: [facility];  Service: Cardiovascular;  Laterality: N/A;  . KNEE ARTHROSCOPY Right   . LAPAROSCOPIC CHOLECYSTECTOMY    . LEFT HEART CATH AND CORONARY ANGIOGRAPHY N/A 10/07/2018   Procedure: LEFT HEART CATH AND CORONARY ANGIOGRAPHY;  Surgeon: Adrian Prows, MD;  Location: [facility];  Service: Cardiovascular;  Laterality: N/A;  . REPAIR ANKLE LIGAMENT Left    "tied up ligament; had tore up qthing in my ankle"  . RIGHT HEART CATH  10/07/2018  . RIGHT/LEFT HEART CATH AND CORONARY ANGIOGRAPHY N/A 08/02/2018   Procedure: RIGHT/LEFT HEART CATH AND CORONARY ANGIOGRAPHY;  Surgeon: Adrian Prows, MD;  Location: [facility];  Service: Cardiovascular;  Laterality: N/A;  . SHOULDER SURGERY     bilateral  . TONSILLECTOMY    . TUBAL LIGATION      There were no  vitals filed for this visit.  Subjective Assessment - 06/30/19 1610    Subjective  Husband stated he feels like she might need a cane to improve gait mechanics.  Pt stated she is feeling good, went to [place] earlier today and went in a couple of shops, has a grandchild on way and purchased gifts for baby shower.  Reports her knee hurts after walking long periods of time and noted increased limping due to pain and weakness.    Pertinent History  Cardiac stent 2019, HTN, Angina    Patient Stated Goals  "Have less pain, walk better"    Currently in Pain?  No/denies         [facility] PT Assessment - 06/30/19 0001      Assessment   Medical Diagnosis  stroke-like episode     Referring Provider (PT)  Garvin Fila, MD    Onset Date/Surgical Date  06/08/19    Hand Dominance  Left    Next MD Visit  08/15/19    Prior Therapy  None      Standardized Balance Assessment   Standardized Balance Assessment  Berg Balance Test      Berg Balance Test   Sit to Stand  Able to stand without using hands and stabilize independently    Standing Unsupported  Able to stand 2 minutes with supervision   c/o lightheadedness upon initial standing   Sitting with Back Unsupported but Feet Supported on Floor or Stool  Able to sit safely and securely 2 minutes    Stand to Sit  Sits safely with minimal use of hands    Transfers  Able to transfer safely, minor use of hands    Standing Unsupported with Eyes Closed  Able to stand 10 seconds with supervision    Standing Unsupported with Feet Together  Needs help to attain position but able to stand for 30 seconds with feet together    From Standing, Reach Forward with Outstretched Arm  Can reach forward >12 cm safely (5")    From Standing Position, Pick up Object from Floor  Able to pick up shoe, needs supervision    From Standing Position, Turn to Look Behind Over each Shoulder  Turn sideways only but maintains balance    Turn 360 Degrees  Able to turn 360 degrees safely but slowly    Standing Unsupported, Alternately Place Feet on Step/Stool  Able to complete >2 steps/needs minimal assist    Standing Unsupported, One Foot in Front  Loses balance while stepping or standing    Standing on One Leg  Able to lift leg independently and hold equal to or more than 3 seconds    Total Score  36                   [facility] Adult PT Treatment/Exercise - 06/30/19 0001      Exercises   Exercises  Knee/Hip      Knee/Hip Exercises: Standing   Gait Training  Gait training with SPC 3 then 2 point sequence x 250 ft      Knee/Hip Exercises: Supine   Bridges  10 reps    Straight Leg Raises  10 reps      Knee/Hip Exercises: Sidelying    Clams  10x 5" holds BLE          Balance Exercises - 06/30/19 1903      Balance Exercises: Standing   Standing Eyes Opened  Narrow base of support (BOS);2 reps;20 secs;30  secs    Partial Tandem Stance  Eyes open;2 reps;30 secs          PT Short Term Goals - 06/28/19 1539      PT SHORT TERM GOAL #1   Title  Patient will be IND with initial HEP to improve funcitonal outcomes    Time  3    Period  Weeks    Status  New    Target Date  07/19/19      PT SHORT TERM GOAL #2   Title  Patient will have pain in LT knee no greater than 3/10 on average to improve funcitonal mobility    Time  3    Period  Weeks    Status  New    Target Date  07/19/19      PT SHORT TERM GOAL #3   Title  Patient will improve RLE MMT by 1/2 grade to reduce pain and improve functional mobility    Time  3    Period  Weeks    Status  New    Target Date  07/19/19        PT Long Term Goals - 06/28/19 1542      PT LONG TERM GOAL #1   Title  Patient will improve RLE MMT to greater than or equal to 4/5 throuhout to improve funcional mobility and standing tolerance    Time  6    Period  Weeks    Status  New    Target Date  08/09/19      PT LONG TERM GOAL #2   Title  Patient will be able to perform SLS > or equal to 10 seconds on BLE to improve funcitonal mobility and reduce risk of falls    Time  6    Period  Weeks    Status  New    Target Date  08/09/19      PT LONG TERM GOAL #3   Title  Patient will be able to walk >30 minutes IND with no increase in pain level to improve funcitonal mobility and ADLs    Time  6    Period  Weeks    Status  New    Target Date  08/09/19            Plan - 06/30/19 1852    Clinical Impression Statement  Reviewed goals and established HEP.  Session focus on LE strengthening and balance training.  BERG balance test complete to assess need for AD wiht score 36/56 indication risk of fall.  Pt instructed gait training with SPC 2 point sequence with good  follow through, did require cueing for equal stride length and heel to toe mechanics and able to demonstrate good sequence towards EOS.  Edcuated pt and husband to purchase hurrycane to assist with balance as well as pain control with knee during gait.  HEP established for hip strengthening, pt able to demonstrate good form following initial cueing and given printout to assist with recall of exercises.  No reoprts of increased pain through session.    Personal Factors and Comorbidities  Comorbidity 3+    Comorbidities  HTN, cardiac stent, angina    Examination-Activity Limitations  Locomotion Level;Lift;Transfers;Stand;Stairs;Squat    Examination-Participation Restrictions  Cleaning;Yard Work;Laundry;Community Activity    Stability/Clinical Decision Making  Evolving/Moderate complexity    Clinical Decision Making  Moderate    Rehab Potential  Good    PT Frequency  2x / week    PT Duration  6 weeks    PT Treatment/Interventions  ADLs/Self Care Home Management;Aquatic Therapy;Biofeedback;Cryotherapy;Electrical Stimulation;Moist Heat;Ultrasound;DME Instruction;Gait training;Stair training;Functional mobility training;Therapeutic activities;Therapeutic exercise;Balance training;Manual techniques;Orthotic Fit/Training;Patient/family education;Neuromuscular re-education;Passive range of motion;Joint Manipulations;Taping;Vasopneumatic Device    PT Next Visit Plan  F/U wiht compliance wiht HEP and answer any questions pertaining to cane.  Next session continue with standing balance (NBOS, tandem), heel raises, standing hip abd/ ext, step ups fwd/ lateral    PT Home Exercise Plan  10/23.3/20:  Supine bridge and SLR, sidelying clamshell.       Patient will benefit from skilled therapeutic intervention in order to improve the following deficits and impairments:  Abnormal gait, Decreased endurance, Impaired sensation, Decreased activity tolerance, Decreased strength, Pain, Difficulty walking, Decreased mobility,  Decreased balance, Decreased coordination, Improper body mechanics  Visit Diagnosis: Muscle weakness (generalized)  Other abnormalities of gait and mobility     Problem List Patient Active Problem List   Diagnosis Date Noted  . Raynaud's disease without gangrene 10/15/2018  . Angina pectoris ([place]) 10/07/2018  . Essential hypertension 08/03/2018  . CAD (coronary artery disease), native coronary artery 08/03/2018  . Post PTCA 08/02/2018  . Elevated IgE level 07/19/2018  . Dyspnea on exertion 07/19/2018  . Long-term exposure involving bird droppings 07/19/2018  . Iliotibial band syndrome of left side 10/21/2011   Ihor Austin, LPTA; CBIS [phone]  Aldona Lento 06/30/2019, 7:04 PM  [place] [address] [place], [place], [address] Phone: [phone]   Fax:  [phone]  Name: Tonya Boyd MRN: [id number] Date of Birth: 06-13-1956

## 2019-06-30 NOTE — Patient Instructions (Signed)
Bridge    Lie back, legs bent. Inhale, pressing hips up. Keeping ribs in, lengthen lower back. Exhale, rolling down along spine from top. Repeat 10 times. Do 2 sessions per day.  http://pm.exer.us/55   Copyright  VHI. All rights reserved.   Straight Leg Raise    Tighten stomach and slowly raise locked right leg 12 inches from floor. Repeat 10 times per set. Do 2 sets per day.  http://orth.exer.us/1103   Copyright  VHI. All rights reserved.   Abduction: Clam (Eccentric) - Side-Lying    Lie on side with knees bent. Lift top knee, keeping feet together. Keep trunk steady. Slowly lower for 3-5 seconds. 10 reps per set, 1-2 sets per day, 4 days per week.  http://ecce.exer.us/65   Copyright  VHI. All rights reserved.

## 2019-07-03 ENCOUNTER — Encounter (HOSPITAL_COMMUNITY): Payer: Self-pay | Admitting: Speech Pathology

## 2019-07-03 ENCOUNTER — Ambulatory Visit (HOSPITAL_COMMUNITY): Payer: BC Managed Care – PPO | Admitting: Speech Pathology

## 2019-07-03 ENCOUNTER — Other Ambulatory Visit: Payer: Self-pay

## 2019-07-03 DIAGNOSIS — R41841 Cognitive communication deficit: Secondary | ICD-10-CM

## 2019-07-03 NOTE — Therapy (Signed)
[place], [place], [address] Phone: [phone]   Fax:  [phone]  Speech Language Pathology Treatment  Patient Details  Name: Tonya Boyd MRN: [id number] Date of Birth: 12/27/1955 Referring Provider (SLP): Fernande Bras, MD   Encounter Date: 07/03/2019  End of Session - 07/03/19 1056    Visit Number  3    Number of Visits  9    Date for SLP Re-Evaluation  07/27/19    Authorization Type  BCBS PPO   no deductible, OOP $[id number] met   SLP Start Time  1032    SLP Stop Time   1116    SLP Time Calculation (min)  44 min    Activity Tolerance  Patient tolerated treatment well       Past Medical History:  Diagnosis Date  . Angina pectoris ([place])   . Asthma    "as a child and as an adult" (08/02/2018)  . CAD (coronary artery disease), native coronary artery 08/03/2018  . Chronic bronchitis ([place])   . Chronic kidney disease   . Coronary artery disease   . Family history of adverse reaction to anesthesia    "cousin stopped breathing; he was allergic to the anesthesia" (08/02/2018)  . GERD (gastroesophageal reflux disease)   . High cholesterol   . History of gout   . History of hiatal hernia   . History of kidney stones   . Hypertension 07/2018  . Interstitial cystitis   . NSVT (nonsustained ventricular tachycardia) ([place]) 07/2018  . Raynaud's disease    "feet and hands" (08/02/2018)  . Raynaud's disease without gangrene 10/15/2018    Past Surgical History:  Procedure Laterality Date  . ABDOMINAL HYSTERECTOMY    . ANTERIOR CERVICAL DECOMP/DISCECTOMY FUSION    . APPENDECTOMY    . AUGMENTATION MAMMAPLASTY Bilateral   . BACK SURGERY    . CORONARY ANGIOPLASTY WITH STENT PLACEMENT  08/02/2018  . CORONARY STENT INTERVENTION N/A 08/02/2018   Procedure: CORONARY STENT INTERVENTION;  Surgeon: Adrian Prows, MD;  Location: [facility];  Service: Cardiovascular;  Laterality: N/A;  RCA   . CYSTOSCOPY W/ STONE  MANIPULATION    . INTRAVASCULAR PRESSURE WIRE/FFR STUDY N/A 10/07/2018   Procedure: INTRAVASCULAR PRESSURE WIRE/FFR STUDY;  Surgeon: Adrian Prows, MD;  Location: [facility];  Service: Cardiovascular;  Laterality: N/A;  . KNEE ARTHROSCOPY Right   . LAPAROSCOPIC CHOLECYSTECTOMY    . LEFT HEART CATH AND CORONARY ANGIOGRAPHY N/A 10/07/2018   Procedure: LEFT HEART CATH AND CORONARY ANGIOGRAPHY;  Surgeon: Adrian Prows, MD;  Location: [facility];  Service: Cardiovascular;  Laterality: N/A;  . REPAIR ANKLE LIGAMENT Left    "tied up ligament; had tore up qthing in my ankle"  . RIGHT HEART CATH  10/07/2018  . RIGHT/LEFT HEART CATH AND CORONARY ANGIOGRAPHY N/A 08/02/2018   Procedure: RIGHT/LEFT HEART CATH AND CORONARY ANGIOGRAPHY;  Surgeon: Adrian Prows, MD;  Location: [facility];  Service: Cardiovascular;  Laterality: N/A;  . SHOULDER SURGERY     bilateral  . TONSILLECTOMY    . TUBAL LIGATION      There were no vitals filed for this visit.  Subjective Assessment - 07/03/19 1039    Subjective  "It is a little better."    Currently in Pain?  No/denies         ADULT SLP TREATMENT - 07/03/19 1040      General Information   Behavior/Cognition  Alert;Cooperative;Pleasant mood  Patient Positioning  Upright in chair    Oral care provided  N/A    HPI  18 year patient with speech difficulty, right sided weakness and numbness from stroke like episode of unclear etiology. MRI brain x 2 negative for stroke. 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 Exam shows nonorganic features.No obvious underlying psychosocial stressors identified.       Treatment Provided   Treatment provided  Cognitive-Linquistic      Pain Assessment   Pain Assessment  No/denies pain      Cognitive-Linquistic Treatment   Treatment focused on  Aphasia;Apraxia;Patient/family/caregiver education    Skilled Treatment  SLP provided skilled SLP therapy targeting s-blends, fronting  patterns, intonation, and verbal expression during picture description tasks and during structured conversations.      Assessment / Recommendations / Plan   Plan  Continue with current plan of care      Progression Toward Goals   Progression toward goals  Progressing toward goals       SLP Education - 07/03/19 1041    Education Details  Provided naming tasks and articulation pictures to practice at home    Person(s) Educated  Patient    Methods  Explanation;Handout    Comprehension  Verbalized understanding       SLP Short Term Goals - 07/03/19 1057      SLP SHORT TERM GOAL #1   Title  Pt will produce s-blends in the initial position of words with 90% acc when labeling pictures with min cues for error awareness.    Baseline  25%    Time  4    Period  Weeks    Status  On-going    Target Date  07/27/19      SLP SHORT TERM GOAL #2   Title  Pt will imitate 7+ word sentences with 95% acc with cue for error awareness for proper grammar/syntax and allowance for articulatory errors.    Baseline  Pt substitutes me/I, is/be, etc; 75%    Time  4    Period  Weeks    Status  On-going    Target Date  07/27/19      SLP SHORT TERM GOAL #3   Title  Pt will describe pictures by generating sentences of 5+ words using proper grammar and syntax with 90% acc and min cues.    Baseline  70% acc    Time  4    Period  Weeks    Status  On-going    Target Date  07/27/19      SLP SHORT TERM GOAL #4   Title  Pt will complete functional working memory activities with 90% acc and use of memory strategies when provided mi/mod cues.    Baseline  25% no assist    Time  4    Period  Weeks    Status  On-going    Target Date  07/27/19      SLP SHORT TERM GOAL #5   Title  Continue ongoing assessment of speech changes related to grammar, syntax, and articulatory errors over the next few sessions.    Time  4    Period  Weeks    Status  On-going    Target Date  07/27/19       SLP Long Term Goals -  07/03/19 1057      SLP LONG TERM GOAL #1   Title  Same as short term goals       Plan - 07/03/19  1057    Clinical Impression Statement Pt has made improvement in her expressive language skills. She is self correcting articulatory and grammatical errors greater than 50% of the time in structured tasks. She acknowledges that her speech is improved when she slows down (better in the AM than PM). She produced s-blends with 100% acc with min cues in oral reading of single words. She read single words with picture cues for "th" and final /s/ words with 90% acc. In picture description task, she formulated 6+ words sentences with 90% acc and imitated sentences for intonation with 90% acc. Pt exhibits an atypical speech sound pattern with difficulty with s-blends, final sound omissions, stopping, and fronting. Plan to continue targeting speech goals.    Speech Therapy Frequency  2x / week    Duration  4 weeks    Treatment/Interventions  Cueing hierarchy;SLP instruction and feedback;Compensatory techniques;Compensatory strategies;Functional tasks;Patient/family education;Multimodal communcation approach    Potential to Achieve Goals  Good    SLP Home Exercise Plan  Pt will complete HEP as assigned to facilitate carryover of treatment strategies and techniques in home environment independently.    Consulted and Agree with Plan of Care  Patient       Patient will benefit from skilled therapeutic intervention in order to improve the following deficits and impairments:   Cognitive communication deficit    Problem List Patient Active Problem List   Diagnosis Date Noted  . Raynaud's disease without gangrene 10/15/2018  . Angina pectoris ([place]) 10/07/2018  . Essential hypertension 08/03/2018  . CAD (coronary artery disease), native coronary artery 08/03/2018  . Post PTCA 08/02/2018  . Elevated IgE level 07/19/2018  . Dyspnea on exertion 07/19/2018  . Long-term exposure involving bird droppings  07/19/2018  . Iliotibial band syndrome of left side 10/21/2011   Thank you,  Genene Churn, [place]  [facility] 07/03/2019, 10:58 AM  [place], [place], [address] Phone: [phone]   Fax:  [phone]   Name: Tonya Boyd MRN: [id number] Date of Birth: 27-Jan-1956

## 2019-07-05 ENCOUNTER — Other Ambulatory Visit: Payer: Self-pay

## 2019-07-05 ENCOUNTER — Ambulatory Visit (HOSPITAL_COMMUNITY): Payer: BC Managed Care – PPO | Admitting: Speech Pathology

## 2019-07-05 ENCOUNTER — Ambulatory Visit (INDEPENDENT_AMBULATORY_CARE_PROVIDER_SITE_OTHER): Payer: BC Managed Care – PPO

## 2019-07-05 ENCOUNTER — Encounter (HOSPITAL_COMMUNITY): Payer: Self-pay | Admitting: Speech Pathology

## 2019-07-05 ENCOUNTER — Ambulatory Visit (HOSPITAL_COMMUNITY): Payer: BC Managed Care – PPO

## 2019-07-05 DIAGNOSIS — R4701 Aphasia: Secondary | ICD-10-CM

## 2019-07-05 DIAGNOSIS — R41841 Cognitive communication deficit: Secondary | ICD-10-CM

## 2019-07-05 DIAGNOSIS — R4789 Other speech disturbances: Secondary | ICD-10-CM

## 2019-07-05 DIAGNOSIS — R200 Anesthesia of skin: Secondary | ICD-10-CM

## 2019-07-05 NOTE — Therapy (Signed)
[place], [place], [address] Phone: [phone]   Fax:  [phone]  Speech Language Pathology Treatment  Patient Details  Name: Tonya Boyd MRN: [id number] Date of Birth: December 12, 1955 Referring Provider (SLP): Fernande Bras, MD   Encounter Date: 07/05/2019  End of Session - 07/05/19 1051    Visit Number  4    Number of Visits  9    Date for SLP Re-Evaluation  07/27/19    Authorization Type  BCBS PPO   no deductible, OOP $3600 met   SLP Start Time  1030    SLP Stop Time   1115    SLP Time Calculation (min)  45 min    Activity Tolerance  Patient tolerated treatment well       Past Medical History:  Diagnosis Date  . Angina pectoris ([facility])   . Asthma    "as a child and as an adult" (08/02/2018)  . CAD (coronary artery disease), native coronary artery 08/03/2018  . Chronic bronchitis ([facility])   . Chronic kidney disease   . Coronary artery disease   . Family history of adverse reaction to anesthesia    "cousin stopped breathing; he was allergic to the anesthesia" (08/02/2018)  . GERD (gastroesophageal reflux disease)   . High cholesterol   . History of gout   . History of hiatal hernia   . History of kidney stones   . Hypertension 07/2018  . Interstitial cystitis   . NSVT (nonsustained ventricular tachycardia) ([facility]) 07/2018  . Raynaud's disease    "feet and hands" (08/02/2018)  . Raynaud's disease without gangrene 10/15/2018    Past Surgical History:  Procedure Laterality Date  . ABDOMINAL HYSTERECTOMY    . ANTERIOR CERVICAL DECOMP/DISCECTOMY FUSION    . APPENDECTOMY    . AUGMENTATION MAMMAPLASTY Bilateral   . BACK SURGERY    . CORONARY ANGIOPLASTY WITH STENT PLACEMENT  08/02/2018  . CORONARY STENT INTERVENTION N/A 08/02/2018   Procedure: CORONARY STENT INTERVENTION;  Surgeon: Adrian Prows, MD;  Location: [facility];  Service: Cardiovascular;  Laterality: N/A;  RCA   . CYSTOSCOPY W/ STONE  MANIPULATION    . INTRAVASCULAR PRESSURE WIRE/FFR STUDY N/A 10/07/2018   Procedure: INTRAVASCULAR PRESSURE WIRE/FFR STUDY;  Surgeon: Adrian Prows, MD;  Location: [facility];  Service: Cardiovascular;  Laterality: N/A;  . KNEE ARTHROSCOPY Right   . LAPAROSCOPIC CHOLECYSTECTOMY    . LEFT HEART CATH AND CORONARY ANGIOGRAPHY N/A 10/07/2018   Procedure: LEFT HEART CATH AND CORONARY ANGIOGRAPHY;  Surgeon: Adrian Prows, MD;  Location: [facility];  Service: Cardiovascular;  Laterality: N/A;  . REPAIR ANKLE LIGAMENT Left    "tied up ligament; had tore up qthing in my ankle"  . RIGHT HEART CATH  10/07/2018  . RIGHT/LEFT HEART CATH AND CORONARY ANGIOGRAPHY N/A 08/02/2018   Procedure: RIGHT/LEFT HEART CATH AND CORONARY ANGIOGRAPHY;  Surgeon: Adrian Prows, MD;  Location: [facility];  Service: Cardiovascular;  Laterality: N/A;  . SHOULDER SURGERY     bilateral  . TONSILLECTOMY    . TUBAL LIGATION      There were no vitals filed for this visit.  Subjective Assessment - 07/05/19 1040    Subjective  "I had a terrible day yesterday."    Currently in Pain?  No/denies         ADULT SLP TREATMENT - 07/05/19 1051      General Information   Behavior/Cognition  Alert;Cooperative;Pleasant mood  Patient Positioning  Upright in chair    Oral care provided  N/A    HPI  60 year patient with speech difficulty, right sided weakness and numbness from stroke like episode of unclear etiology. MRI brain x 2 negative for stroke. 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 Exam shows nonorganic features.No obvious underlying psychosocial stressors identified.       Treatment Provided   Treatment provided  Cognitive-Linquistic      Pain Assessment   Pain Assessment  No/denies pain      Cognitive-Linquistic Treatment   Treatment focused on  Aphasia;Apraxia;Patient/family/caregiver education    Skilled Treatment  SLP provided skilled treatment targeting expressive  speech and language goals with use of modeling, repetition, oral reading, silent rehearsal, and cues for error awareness.      Assessment / Recommendations / Plan   Plan  Continue with current plan of care      Progression Toward Goals   Progression toward goals  Progressing toward goals         SLP Short Term Goals - 07/05/19 1052      SLP SHORT TERM GOAL #1   Title  Pt will produce s-blends in the initial position of words with 90% acc when labeling pictures with min cues for error awareness.    Baseline  25%    Time  4    Period  Weeks    Status  On-going    Target Date  07/27/19      SLP SHORT TERM GOAL #2   Title  Pt will imitate 7+ word sentences with 95% acc with cue for error awareness for proper grammar/syntax and allowance for articulatory errors.    Baseline  Pt substitutes me/I, is/be, etc; 75%    Time  4    Period  Weeks    Status  On-going    Target Date  07/27/19      SLP SHORT TERM GOAL #3   Title  Pt will describe pictures by generating sentences of 5+ words using proper grammar and syntax with 90% acc and min cues.    Baseline  70% acc    Time  4    Period  Weeks    Status  On-going    Target Date  07/27/19      SLP SHORT TERM GOAL #4   Title  Pt will complete functional working memory activities with 90% acc and use of memory strategies when provided mi/mod cues.    Baseline  25% no assist    Time  4    Period  Weeks    Status  On-going    Target Date  07/27/19      SLP SHORT TERM GOAL #5   Title  Continue ongoing assessment of speech changes related to grammar, syntax, and articulatory errors over the next few sessions.    Time  4    Period  Weeks    Status  On-going    Target Date  07/27/19       SLP Long Term Goals - 07/05/19 1052      SLP LONG TERM GOAL #1   Title  Same as short term goals       Plan - 07/05/19 1052    Clinical Impression Statement Pt reports that she had an emotionally stressful day yesterday, which negatively  impacted her speech. SLP explained that emotional stressors are likely to negatively impact her deficits. In session, she orally read /s/ words and  s-blends with 95% acc with difficulty with low frequency words. Will attempt to have Pt read nonsens words next session. She orally read tongue twisters with emphasis on accuracy over speed with 90% acc. She identified 6 of 9 spelling errors in short paragraph and correctly spelled with 95% acc when provided min cues.Plan to continue targeting speech goals.    Speech Therapy Frequency  2x / week    Duration  4 weeks    Treatment/Interventions  Cueing hierarchy;SLP instruction and feedback;Compensatory techniques;Compensatory strategies;Functional tasks;Patient/family education;Multimodal communcation approach    Potential to Achieve Goals  Good    SLP Home Exercise Plan  Pt will complete HEP as assigned to facilitate carryover of treatment strategies and techniques in home environment independently.    Consulted and Agree with Plan of Care  Patient       Patient will benefit from skilled therapeutic intervention in order to improve the following deficits and impairments:   Cognitive communication deficit    Problem List Patient Active Problem List   Diagnosis Date Noted  . Raynaud's disease without gangrene 10/15/2018  . Angina pectoris ([facility]) 10/07/2018  . Essential hypertension 08/03/2018  . CAD (coronary artery disease), native coronary artery 08/03/2018  . Post PTCA 08/02/2018  . Elevated IgE level 07/19/2018  . Dyspnea on exertion 07/19/2018  . Long-term exposure involving bird droppings 07/19/2018  . Iliotibial band syndrome of left side 10/21/2011   Thank you,  Genene Churn, [place]  [facility] 07/05/2019, 10:53 AM  [place] [address] [place], [place], [address] Phone: [phone]   Fax:  [phone]   Name: DEMICA ZOOK MRN: [id number] Date of Birth:  02-14-1956

## 2019-07-06 ENCOUNTER — Ambulatory Visit (HOSPITAL_COMMUNITY): Payer: BC Managed Care – PPO | Admitting: Occupational Therapy

## 2019-07-06 ENCOUNTER — Encounter (HOSPITAL_COMMUNITY): Payer: Self-pay | Admitting: Occupational Therapy

## 2019-07-06 ENCOUNTER — Ambulatory Visit (HOSPITAL_COMMUNITY): Payer: BC Managed Care – PPO | Admitting: Physical Therapy

## 2019-07-06 ENCOUNTER — Encounter (HOSPITAL_COMMUNITY): Payer: Self-pay | Admitting: Physical Therapy

## 2019-07-06 DIAGNOSIS — R278 Other lack of coordination: Secondary | ICD-10-CM

## 2019-07-06 DIAGNOSIS — R2689 Other abnormalities of gait and mobility: Secondary | ICD-10-CM

## 2019-07-06 DIAGNOSIS — M6281 Muscle weakness (generalized): Secondary | ICD-10-CM

## 2019-07-06 DIAGNOSIS — R41841 Cognitive communication deficit: Secondary | ICD-10-CM | POA: Diagnosis not present

## 2019-07-06 DIAGNOSIS — R29898 Other symptoms and signs involving the musculoskeletal system: Secondary | ICD-10-CM

## 2019-07-06 NOTE — Therapy (Signed)
[facility] [address] [place], [place], [address] Phone: [phone]   Fax:  [phone]  Occupational Therapy Treatment  Patient Details  Name: Tonya Boyd MRN: [id number] Date of Birth: [birth year] Referring Provider (OT): Dr. Delia Heady   Encounter Date: 07/06/2019  OT End of Session - 07/06/19 1027    Visit Number  2    Number of Visits  12    Date for OT Re-Evaluation  08/09/19    Authorization Type  [employer] COMM PPO    OT Start Time  [phone]    OT Stop Time  1030    OT Time Calculation (min)  39 min    Activity Tolerance  Patient tolerated treatment well    Behavior During Therapy  [facility] for tasks assessed/performed       Past Medical History:  Diagnosis Date  . Angina pectoris ([facility])   . Asthma    "as a child and as an adult" (08/02/2018)  . CAD (coronary artery disease), native coronary artery 08/03/2018  . Chronic bronchitis ([facility])   . Chronic kidney disease   . Coronary artery disease   . Family history of adverse reaction to anesthesia    "cousin stopped breathing; he was allergic to the anesthesia" (08/02/2018)  . GERD (gastroesophageal reflux disease)   . High cholesterol   . History of gout   . History of hiatal hernia   . History of kidney stones   . Hypertension 07/2018  . Interstitial cystitis   . NSVT (nonsustained ventricular tachycardia) ([facility]) 07/2018  . Raynaud's disease    "feet and hands" (08/02/2018)  . Raynaud's disease without gangrene 10/15/2018    Past Surgical History:  Procedure Laterality Date  . ABDOMINAL HYSTERECTOMY    . ANTERIOR CERVICAL DECOMP/DISCECTOMY FUSION    . APPENDECTOMY    . AUGMENTATION MAMMAPLASTY Bilateral   . BACK SURGERY    . CORONARY ANGIOPLASTY WITH STENT PLACEMENT  08/02/2018  . CORONARY STENT INTERVENTION N/A 08/02/2018   Procedure: CORONARY STENT INTERVENTION;  Surgeon: [name], MD;  [facility];  Service: Cardiovascular;  Laterality: N/A;   RCA   . CYSTOSCOPY W/ STONE MANIPULATION    . INTRAVASCULAR PRESSURE WIRE/FFR STUDY N/A 10/07/2018   Procedure: INTRAVASCULAR PRESSURE WIRE/FFR STUDY;  Surgeon: [name], MD;  Location: [facility];  Service: Cardiovascular;  Laterality: N/A;  . KNEE ARTHROSCOPY Right   . LAPAROSCOPIC CHOLECYSTECTOMY    . LEFT HEART CATH AND CORONARY ANGIOGRAPHY N/A 10/07/2018   Procedure: LEFT HEART CATH AND CORONARY ANGIOGRAPHY;  Surgeon: [name], MD;  Location: [facility];  Service: Cardiovascular;  Laterality: N/A;  . REPAIR ANKLE LIGAMENT Left    "tied up ligament; had tore up qthing in my ankle"  . RIGHT HEART CATH  10/07/2018  . RIGHT/LEFT HEART CATH AND CORONARY ANGIOGRAPHY N/A 08/02/2018   Procedure: RIGHT/LEFT HEART CATH AND CORONARY ANGIOGRAPHY;  Surgeon: [name], MD;  Location: [facility];  Service: Cardiovascular;  Laterality: N/A;  . SHOULDER SURGERY     bilateral  . TONSILLECTOMY    . TUBAL LIGATION      There were no vitals filed for this visit.  Subjective Assessment - 07/06/19 0953    Subjective   S: I think the putty has helped some.    Currently in Pain?  No/denies         [facility] OT Assessment - 07/06/19 0952      Assessment  Medical Diagnosis  stroke-like episode      Precautions   Precautions  None               OT Treatments/Exercises (OP) - 07/06/19 0953      Exercises   Exercises  Hand      Hand Exercises   Hand Gripper with Large Beads  all beads gripper at 25#    Hand Gripper with Medium Beads  all beads gripper at 25#    Hand Gripper with Small Beads  all beads gripper at 22#      Neurological Re-education Exercises   Shoulder Flexion  AROM;15 reps   1#   Shoulder ABduction  AROM;15 reps    Shoulder Protraction  AROM;15 reps    Shoulder Horizontal ABduction  Strengthening;10 reps   1#   Shoulder External Rotation  Strengthening;10 reps   1#   Shoulder Internal Rotation  Strengthening;10 reps   1#   Elbow  Flexion  Strengthening;10 reps   bicep curl, hammer curl, 1#   Elbow Extension  Strengthening;10 reps   bicep curl, hammer curl, 1#   Other Exercises 1  scapular theraband: extension, row, retraction, 10X red band      Fine Motor Coordination (Hand/Wrist)   Fine Motor Coordination  Small Pegboard    Small Pegboard  Pt holding pegs in hand and working on in-hand manipulation to bring to fingertips and place in pegboard. Pt working on hold remaining pegs in palm while placing single peg. Occasionally dropping pegs from palm. Pt then removed all pegs working to translate to palm and maintain hold through task.                OT Short Term Goals - 07/06/19 1022      OT SHORT TERM GOAL #1   Title  Pt will be provided with and educated on HEP to improve RUE use as non-dominant during functional tasks.    Time  3    Period  Weeks    Status  On-going    Target Date  08/09/19      OT SHORT TERM GOAL #2   Title  Pt will improve right grip strength by 8# and pinch strength by 3# to improve ability to hold dishes when washing.    Time  3    Period  Weeks    Status  On-going        OT Long Term Goals - 07/06/19 1022      OT LONG TERM GOAL #1   Title  Pt will increase RUE strength to 4+/5 to improve ability to lift items into overhead cabinets safely using RUE as assist.    Time  6    Period  Weeks    Status  On-going      OT LONG TERM GOAL #2   Title  Pt will increase right grip strength by 15# and pinch strength by 6# to increase ability to carry bags or handheld items during ADLs.    Time  6    Period  Weeks    Status  On-going      OT LONG TERM GOAL #3   Title  Pt will improve fine motor coordination by completing 9 hole peg test in under 26" to improve skills required for fixing hair.    Time  6    Period  Weeks    Status  On-going      OT LONG TERM GOAL #4   Title  Pt will decrease pain in RUE to 3/10 or less to improve ability to sleep at night.    Time  6     Period  Weeks    Status  On-going            Plan - 07/06/19 1022    Clinical Impression Statement  A: Initiated RUE strengthening, grip strengthening, and coordination exercises today. Pt with difficulty maintaining elbow extension after shoulder reaches 90 degrees, therefore completed A/ROM with focus on elbow extension for shoulder flexion, abduction, and horizontal abduction. Pt requiring intermittent verbal cuing for technique with beads, pt tends to attempt to grasp bead to quickly and loses grip.    Body Structure / Function / Physical Skills  ADL;UE functional use;Pain;FMC;GMC;Sensation;IADL;Strength    Plan  P: continue with RUE strengthening, add pinch task       Patient will benefit from skilled therapeutic intervention in order to improve the following deficits and impairments:   Body Structure / Function / Physical Skills: ADL, UE functional use, Pain, FMC, GMC, Sensation, IADL, Strength       Visit Diagnosis: Other symptoms and signs involving the musculoskeletal system  Other lack of coordination    Problem List Patient Active Problem List   Diagnosis Date Noted  . Raynaud's disease without gangrene 10/15/2018  . Angina pectoris ([facility]) 10/07/2018  . Essential hypertension 08/03/2018  . CAD (coronary artery disease), native coronary artery 08/03/2018  . Post PTCA 08/02/2018  . Elevated IgE level 07/19/2018  . Dyspnea on exertion 07/19/2018  . Long-term exposure involving bird droppings 07/19/2018  . Iliotibial band syndrome of left side 10/21/2011   Ezra SitesLeslie Troxler, OTR/L  [phone] 07/06/2019, 10:37 AM  [place] [address] [place], [place], [address] Phone: [phone]   Fax:  [phone]  Name: Tonya Boyd MRN: [id number] Date of Birth: 03/20/1956

## 2019-07-06 NOTE — Therapy (Signed)
[place], [place], [address] Phone: [phone]   Fax:  [phone]  Physical Therapy Treatment  Patient Details  Name: Tonya Boyd MRN: [id number] Date of Birth: Sep 16, 1955 Referring Provider (PT): Garvin Fila, MD   Encounter Date: 07/06/2019  PT End of Session - 07/06/19 1038    Visit Number  3    Number of Visits  12    Date for PT Re-Evaluation  08/09/19   Minireassess 07/19/19   Authorization Type  [employer] MET dedcutible and OOPvisits are combined with PT/OT/Chiro - 30 total per benefit period    Authorization Time Period  06/28/19-08/09/19    Authorization - Visit Number  3    Authorization - Number of Visits  12    PT Start Time  1031    PT Stop Time  1115    PT Time Calculation (min)  44 min    Equipment Utilized During Treatment  Gait belt    Activity Tolerance  Patient tolerated treatment well    Behavior During Therapy  [facility] for tasks assessed/performed       Past Medical History:  Diagnosis Date  . Angina pectoris ([place])   . Asthma    "as a child and as an adult" (08/02/2018)  . CAD (coronary artery disease), native coronary artery 08/03/2018  . Chronic bronchitis ([place])   . Chronic kidney disease   . Coronary artery disease   . Family history of adverse reaction to anesthesia    "cousin stopped breathing; he was allergic to the anesthesia" (08/02/2018)  . GERD (gastroesophageal reflux disease)   . High cholesterol   . History of gout   . History of hiatal hernia   . History of kidney stones   . Hypertension 07/2018  . Interstitial cystitis   . NSVT (nonsustained ventricular tachycardia) ([place]) 07/2018  . Raynaud's disease    "feet and hands" (08/02/2018)  . Raynaud's disease without gangrene 10/15/2018    Past Surgical History:  Procedure Laterality Date  . ABDOMINAL HYSTERECTOMY    . ANTERIOR CERVICAL DECOMP/DISCECTOMY FUSION    . APPENDECTOMY    . AUGMENTATION MAMMAPLASTY  Bilateral   . BACK SURGERY    . CORONARY ANGIOPLASTY WITH STENT PLACEMENT  08/02/2018  . CORONARY STENT INTERVENTION N/A 08/02/2018   Procedure: CORONARY STENT INTERVENTION;  Surgeon: Adrian Prows, MD;  Location: [place] [facility];  Service: Cardiovascular;  Laterality: N/A;  RCA   . CYSTOSCOPY W/ STONE MANIPULATION    . INTRAVASCULAR PRESSURE WIRE/FFR STUDY N/A 10/07/2018   Procedure: INTRAVASCULAR PRESSURE WIRE/FFR STUDY;  Surgeon: Adrian Prows, MD;  Location: [place] [facility];  Service: Cardiovascular;  Laterality: N/A;  . KNEE ARTHROSCOPY Right   . LAPAROSCOPIC CHOLECYSTECTOMY    . LEFT HEART CATH AND CORONARY ANGIOGRAPHY N/A 10/07/2018   Procedure: LEFT HEART CATH AND CORONARY ANGIOGRAPHY;  Surgeon: Adrian Prows, MD;  Location: [facility] [facility];  Service: Cardiovascular;  Laterality: N/A;  . REPAIR ANKLE LIGAMENT Left    "tied up ligament; had tore up qthing in my ankle"  . RIGHT HEART CATH  10/07/2018  . RIGHT/LEFT HEART CATH AND CORONARY ANGIOGRAPHY N/A 08/02/2018   Procedure: RIGHT/LEFT HEART CATH AND CORONARY ANGIOGRAPHY;  Surgeon: Adrian Prows, MD;  Location: [facility];  Service: Cardiovascular;  Laterality: N/A;  . SHOULDER SURGERY     bilateral  . TONSILLECTOMY    . TUBAL LIGATION      There were no  vitals filed for this visit.  Subjective Assessment - 07/06/19 1034    Subjective  Patient states she has not been having any pain. She has been using a cane and she has been able to walk alot better. She denies any falls or losses of balance.    Pertinent History  Cardiac stent 2019, HTN, Angina    Limitations  Standing;Walking;House hold activities;Other (comment)   stairs   How long can you stand comfortably?  10 minutes    How long can you walk comfortably?  Not sure    Diagnostic tests  MRI    Patient Stated Goals  "Have less pain, walk better"    Currently in Pain?  No/denies    Pain Onset  1 to 4 weeks ago    Pain Onset  1 to 4 weeks ago                        [facility] Adult PT Treatment/Exercise - 07/06/19 1038      Ambulation/Gait   Ambulation/Gait  Yes    Ambulation Distance (Feet)  230 Feet    Assistive device  Straight cane    Gait Pattern  Decreased stride length;Trendelenburg    Ambulation Surface  Level;Indoor    Gait Comments  gait with dual tasks (cone rotation), no AD, 100 feet      Knee/Hip Exercises: Standing   Heel Raises  15 reps;Both    Lateral Step Up  Both;2 sets;10 reps;Step Height: 6"    Forward Step Up  Both;2 sets;10 reps;Step Height: 6"      Knee/Hip Exercises: Seated   Sit to Sand  2 sets;10 reps;without UE support;Other (comment)   from lowered mat table      Knee/Hip Exercises: Supine   Bridges  10 reps    Straight Leg Raises  10 reps;2 sets    Straight Leg Raises Limitations  verbal cueing for quad set with SLR      Knee/Hip Exercises: Sidelying   Clams  10x 5" holds BLE          Balance Exercises - 07/06/19 1103      Balance Exercises: Standing   SLS  Eyes open;15 secs;Intermittent upper extremity support;Solid surface;Other (comment)   bilateral   Gait with Head Turns  Forward   lateral head turns and up/down head turns 100 feet each       PT Education - 07/06/19 1041    Education Details  patient educated on completion of HEP and proper mechanics of exercises    Person(s) Educated  Patient    Methods  Explanation    Comprehension  Verbalized understanding       PT Short Term Goals - 06/28/19 1539      PT SHORT TERM GOAL #1   Title  Patient will be IND with initial HEP to improve funcitonal outcomes    Time  3    Period  Weeks    Status  New    Target Date  07/19/19      PT SHORT TERM GOAL #2   Title  Patient will have pain in LT knee no greater than 3/10 on average to improve funcitonal mobility    Time  3    Period  Weeks    Status  New    Target Date  07/19/19      PT SHORT TERM GOAL #3   Title  Patient will improve RLE MMT by 1/2 grade to  reduce pain and improve functional mobility    Time  3    Period  Weeks    Status  New    Target Date  07/19/19        PT Long Term Goals - 06/28/19 1542      PT LONG TERM GOAL #1   Title  Patient will improve RLE MMT to greater than or equal to 4/5 throuhout to improve funcional mobility and standing tolerance    Time  6    Period  Weeks    Status  New    Target Date  08/09/19      PT LONG TERM GOAL #2   Title  Patient will be able to perform SLS > or equal to 10 seconds on BLE to improve funcitonal mobility and reduce risk of falls    Time  6    Period  Weeks    Status  New    Target Date  08/09/19      PT LONG TERM GOAL #3   Title  Patient will be able to walk >30 minutes IND with no increase in pain level to improve funcitonal mobility and ADLs    Time  6    Period  Weeks    Status  New    Target Date  08/09/19            Plan - 07/06/19 1120    Clinical Impression Statement  Patient able to complete HEP exercises with minimal verbal and tactile cueing for positioning/biomechanics. She ambulates with cane with Trendelenburg gait pattern and truncal lean posteriorly secondary to impaired R hip abductor and quad weakness. She also demonstrates impaired activity tolerance and fatigue in R LE as session progressed. She has severely impaired static and dynamic balance as evident today during SLS, tandem balance, and with dual tasks while ambulating. Patient educated on completing previously given exercises. Patient will continue to benefit from skilled PT in order to reduce impairment and improve function.    Personal Factors and Comorbidities  Comorbidity 3+    Comorbidities  HTN, cardiac stent, angina    Examination-Activity Limitations  Locomotion Level;Lift;Transfers;Stand;Stairs;Squat    Examination-Participation Restrictions  Cleaning;Yard Work;Laundry;Community Activity    Stability/Clinical Decision Making  Evolving/Moderate complexity    Rehab Potential  Good     PT Frequency  2x / week    PT Duration  6 weeks    PT Treatment/Interventions  ADLs/Self Care Home Management;Aquatic Therapy;Biofeedback;Cryotherapy;Electrical Stimulation;Moist Heat;Ultrasound;DME Instruction;Gait training;Stair training;Functional mobility training;Therapeutic activities;Therapeutic exercise;Balance training;Manual techniques;Orthotic Fit/Training;Patient/family education;Neuromuscular re-education;Passive range of motion;Joint Manipulations;Taping;Vasopneumatic Device    PT Next Visit Plan  Next session continue with standing balance (NBOS, tandem), heel raises, standing hip abd/ ext, progress quad and hip strengthening as tolerated    PT Home Exercise Plan  10/23.3/20:  Supine bridge and SLR, sidelying clamshell.       Patient will benefit from skilled therapeutic intervention in order to improve the following deficits and impairments:  Abnormal gait, Decreased endurance, Impaired sensation, Decreased activity tolerance, Decreased strength, Pain, Difficulty walking, Decreased mobility, Decreased balance, Decreased coordination, Improper body mechanics  Visit Diagnosis: Muscle weakness (generalized)  Other abnormalities of gait and mobility     Problem List Patient Active Problem List   Diagnosis Date Noted  . Raynaud's disease without gangrene 10/15/2018  . Angina pectoris ([place]) 10/07/2018  . Essential hypertension 08/03/2018  . CAD (coronary artery disease), native coronary artery 08/03/2018  . Post PTCA 08/02/2018  . Elevated IgE level 07/19/2018  .  Dyspnea on exertion 07/19/2018  . Long-term exposure involving bird droppings 07/19/2018  . Iliotibial band syndrome of left side 10/21/2011   Clarene Critchley PT, DPT 11:30 AM, 07/06/19 [phone]  Margie Billet PT, DPT   [place] [address] [place], [place], [address] Phone: [phone]   Fax:  [phone]  Name: Tonya Boyd MRN: [id number] Date of  Birth: Oct 09, 1955

## 2019-07-10 ENCOUNTER — Ambulatory Visit (HOSPITAL_COMMUNITY): Payer: BC Managed Care – PPO | Attending: Neurology | Admitting: Speech Pathology

## 2019-07-10 ENCOUNTER — Other Ambulatory Visit: Payer: Self-pay

## 2019-07-10 ENCOUNTER — Encounter (HOSPITAL_COMMUNITY): Payer: Self-pay | Admitting: Speech Pathology

## 2019-07-10 DIAGNOSIS — R2689 Other abnormalities of gait and mobility: Secondary | ICD-10-CM | POA: Diagnosis present

## 2019-07-10 DIAGNOSIS — R278 Other lack of coordination: Secondary | ICD-10-CM | POA: Diagnosis present

## 2019-07-10 DIAGNOSIS — R29898 Other symptoms and signs involving the musculoskeletal system: Secondary | ICD-10-CM | POA: Insufficient documentation

## 2019-07-10 DIAGNOSIS — M79601 Pain in right arm: Secondary | ICD-10-CM | POA: Insufficient documentation

## 2019-07-10 DIAGNOSIS — R41841 Cognitive communication deficit: Secondary | ICD-10-CM | POA: Diagnosis not present

## 2019-07-10 DIAGNOSIS — M6281 Muscle weakness (generalized): Secondary | ICD-10-CM | POA: Insufficient documentation

## 2019-07-10 NOTE — Therapy (Signed)
[place], [place], [address] Phone: [phone]   Fax:  [phone]  Speech Language Pathology Treatment  Patient Details  Name: Tonya Boyd MRN: [id number] Date of Birth: 08-24-1956 Referring Provider (SLP): Fernande Bras, MD   Encounter Date: 07/10/2019  End of Session - 07/10/19 0001    Visit Number  5    Number of Visits  9    Date for SLP Re-Evaluation  07/27/19    SLP Start Time  1031    SLP Stop Time   1120    SLP Time Calculation (min)  49 min    Activity Tolerance  Patient tolerated treatment well       Past Medical History:  Diagnosis Date  . Angina pectoris ([place])   . Asthma    "as a child and as an adult" (08/02/2018)  . CAD (coronary artery disease), native coronary artery 08/03/2018  . Chronic bronchitis ([place])   . Chronic kidney disease   . Coronary artery disease   . Family history of adverse reaction to anesthesia    "cousin stopped breathing; he was allergic to the anesthesia" (08/02/2018)  . GERD (gastroesophageal reflux disease)   . High cholesterol   . History of gout   . History of hiatal hernia   . History of kidney stones   . Hypertension 07/2018  . Interstitial cystitis   . NSVT (nonsustained ventricular tachycardia) ([place]) 07/2018  . Raynaud's disease    "feet and hands" (08/02/2018)  . Raynaud's disease without gangrene 10/15/2018    Past Surgical History:  Procedure Laterality Date  . ABDOMINAL HYSTERECTOMY    . ANTERIOR CERVICAL DECOMP/DISCECTOMY FUSION    . APPENDECTOMY    . AUGMENTATION MAMMAPLASTY Bilateral   . BACK SURGERY    . CORONARY ANGIOPLASTY WITH STENT PLACEMENT  08/02/2018  . CORONARY STENT INTERVENTION N/A 08/02/2018   Procedure: CORONARY STENT INTERVENTION;  Surgeon: Adrian Prows, MD;  Location: [place] [facility];  Service: Cardiovascular;  Laterality: N/A;  RCA   . CYSTOSCOPY W/ STONE MANIPULATION    . INTRAVASCULAR PRESSURE WIRE/FFR STUDY N/A  10/07/2018   Procedure: INTRAVASCULAR PRESSURE WIRE/FFR STUDY;  Surgeon: Adrian Prows, MD;  Location: [facility];  Service: Cardiovascular;  Laterality: N/A;  . KNEE ARTHROSCOPY Right   . LAPAROSCOPIC CHOLECYSTECTOMY    . LEFT HEART CATH AND CORONARY ANGIOGRAPHY N/A 10/07/2018   Procedure: LEFT HEART CATH AND CORONARY ANGIOGRAPHY;  Surgeon: Adrian Prows, MD;  Location: [facility];  Service: Cardiovascular;  Laterality: N/A;  . REPAIR ANKLE LIGAMENT Left    "tied up ligament; had tore up qthing in my ankle"  . RIGHT HEART CATH  10/07/2018  . RIGHT/LEFT HEART CATH AND CORONARY ANGIOGRAPHY N/A 08/02/2018   Procedure: RIGHT/LEFT HEART CATH AND CORONARY ANGIOGRAPHY;  Surgeon: Adrian Prows, MD;  Location: [facility] [facility];  Service: Cardiovascular;  Laterality: N/A;  . SHOULDER SURGERY     bilateral  . TONSILLECTOMY    . TUBAL LIGATION      There were no vitals filed for this visit.  Subjective Assessment - 07/10/19 1044    Subjective  "If I take my time, I can speak well."    Currently in Pain?  No/denies        ADULT SLP TREATMENT - 07/10/19 1045      General Information   Behavior/Cognition  Alert;Cooperative;Pleasant mood    Patient Positioning  Upright in chair    Oral  care provided  N/A    HPI  78 year patient with speech difficulty, right sided weakness and numbness from stroke like episode of unclear etiology. MRI brain x 2 negative for stroke. 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 Exam shows nonorganic features.No obvious underlying psychosocial stressors identified.       Treatment Provided   Treatment provided  Cognitive-Linquistic      Pain Assessment   Pain Assessment  No/denies pain      Cognitive-Linquistic Treatment   Treatment focused on  Aphasia;Apraxia;Patient/family/caregiver education    Skilled Treatment  SLP provided skilled SLP treatment targeting speech goals. SLP used tokens to identify grammatical errors by  placing tokens in a cup when errors were heard. Pt benefited from the feedback and was able to self correct 100% of the time. S-blends were produced with 95% acc with allowance for self correction and cues for error awareness. She was asked to read aloud non-sense words and did so with 75% acc with mod cues for corrections. Pt tended to substitute s/z, t/d, and p/b.      Assessment / Recommendations / Plan   Plan  Continue with current plan of care      Progression Toward Goals   Progression toward goals  Progressing toward goals         SLP Short Term Goals - 07/10/19 1047      SLP SHORT TERM GOAL #1   Title  Pt will produce s-blends in the initial position of words with 90% acc when labeling pictures with min cues for error awareness.    Baseline  25%    Time  4    Period  Weeks    Status  On-going    Target Date  07/27/19      SLP SHORT TERM GOAL #2   Title  Pt will imitate 7+ word sentences with 95% acc with cue for error awareness for proper grammar/syntax and allowance for articulatory errors.    Baseline  Pt substitutes me/I, is/be, etc; 75%    Time  4    Period  Weeks    Status  On-going    Target Date  07/27/19      SLP SHORT TERM GOAL #3   Title  Pt will describe pictures by generating sentences of 5+ words using proper grammar and syntax with 90% acc and min cues.    Baseline  70% acc    Time  4    Period  Weeks    Status  On-going    Target Date  07/27/19      SLP SHORT TERM GOAL #4   Title  Pt will complete functional working memory activities with 90% acc and use of memory strategies when provided mi/mod cues.    Baseline  25% no assist    Time  4    Period  Weeks    Status  On-going    Target Date  07/27/19      SLP SHORT TERM GOAL #5   Title  Continue ongoing assessment of speech changes related to grammar, syntax, and articulatory errors over the next few sessions.    Time  4    Period  Weeks    Status  On-going    Target Date  07/27/19        SLP Long Term Goals - 07/10/19 1048      SLP LONG TERM GOAL #1   Title  Same as short term goals  Plan - 07/10/19 1047    Clinical Impression Statement Pt alert and cooperative for therapy and continues to demonstrate excellent progress in her speech. She is self correcting almost 95% of the time. Continue targeting goals above. Pt stated she needs to be discharged from therapy by the end of the month due to insurance and financial implications.   Speech Therapy Frequency  2x / week    Duration  4 weeks    Treatment/Interventions  Cueing hierarchy;SLP instruction and feedback;Compensatory techniques;Compensatory strategies;Functional tasks;Patient/family education;Multimodal communcation approach    Potential to Achieve Goals  Good    SLP Home Exercise Plan  Pt will complete HEP as assigned to facilitate carryover of treatment strategies and techniques in home environment independently.    Consulted and Agree with Plan of Care  Patient       Patient will benefit from skilled therapeutic intervention in order to improve the following deficits and impairments:   Cognitive communication deficit    Problem List Patient Active Problem List   Diagnosis Date Noted  . Raynaud's disease without gangrene 10/15/2018  . Angina pectoris ([facility]) 10/07/2018  . Essential hypertension 08/03/2018  . CAD (coronary artery disease), native coronary artery 08/03/2018  . Post PTCA 08/02/2018  . Elevated IgE level 07/19/2018  . Dyspnea on exertion 07/19/2018  . Long-term exposure involving bird droppings 07/19/2018  . Iliotibial band syndrome of left side 10/21/2011   Thank you,  Havery MorosDabney Tieler Cournoyer, CCC-SLP [phone]  [facility] LLCORTER,Eura Mccauslin 07/10/2019, 10:48 AM   [facility] [address] [place], [place], [address] Phone: [phone]   Fax:  [phone]   Name: Tonya Boyd MRN: [id number] Date of Birth: 05/08/1956

## 2019-07-11 ENCOUNTER — Ambulatory Visit (HOSPITAL_COMMUNITY): Payer: BC Managed Care – PPO | Admitting: Occupational Therapy

## 2019-07-11 ENCOUNTER — Encounter (HOSPITAL_COMMUNITY): Payer: Self-pay | Admitting: Occupational Therapy

## 2019-07-11 ENCOUNTER — Ambulatory Visit (HOSPITAL_COMMUNITY): Payer: BC Managed Care – PPO

## 2019-07-11 ENCOUNTER — Encounter (HOSPITAL_COMMUNITY): Payer: Self-pay

## 2019-07-11 DIAGNOSIS — M6281 Muscle weakness (generalized): Secondary | ICD-10-CM

## 2019-07-11 DIAGNOSIS — R278 Other lack of coordination: Secondary | ICD-10-CM

## 2019-07-11 DIAGNOSIS — R41841 Cognitive communication deficit: Secondary | ICD-10-CM | POA: Diagnosis not present

## 2019-07-11 DIAGNOSIS — R2689 Other abnormalities of gait and mobility: Secondary | ICD-10-CM

## 2019-07-11 DIAGNOSIS — R29898 Other symptoms and signs involving the musculoskeletal system: Secondary | ICD-10-CM

## 2019-07-11 NOTE — Therapy (Signed)
[place], [place], [address] Phone: [phone]   Fax:  [phone]  Physical Therapy Treatment  Patient Details  Name: Tonya Boyd MRN: [id number] Date of Birth: 10/23/1955 Referring Provider (PT): Garvin Fila, MD   Encounter Date: 07/11/2019  PT End of Session - 07/11/19 0924    Visit Number  4    Number of Visits  12    Date for PT Re-Evaluation  08/09/19   Minireassess 07/19/19   Authorization Type  BCBS MET dedcutible and OOPvisits are combined with PT/OT/Chiro - 30 total per benefit period    Authorization Time Period  06/28/19-08/09/19    Authorization - Visit Number  4    Authorization - Number of Visits  12    PT Start Time  0922    PT Stop Time  1000    PT Time Calculation (min)  38 min    Equipment Utilized During Treatment  Gait belt    Activity Tolerance  Patient tolerated treatment well    Behavior During Therapy  [facility] for tasks assessed/performed       Past Medical History:  Diagnosis Date  . Angina pectoris ([place])   . Asthma    "as a child and as an adult" (08/02/2018)  . CAD (coronary artery disease), native coronary artery 08/03/2018  . Chronic bronchitis ([place])   . Chronic kidney disease   . Coronary artery disease   . Family history of adverse reaction to anesthesia    "cousin stopped breathing; he was allergic to the anesthesia" (08/02/2018)  . GERD (gastroesophageal reflux disease)   . High cholesterol   . History of gout   . History of hiatal hernia   . History of kidney stones   . Hypertension 07/2018  . Interstitial cystitis   . NSVT (nonsustained ventricular tachycardia) ([place]) 07/2018  . Raynaud's disease    "feet and hands" (08/02/2018)  . Raynaud's disease without gangrene 10/15/2018    Past Surgical History:  Procedure Laterality Date  . ABDOMINAL HYSTERECTOMY    . ANTERIOR CERVICAL DECOMP/DISCECTOMY FUSION    . APPENDECTOMY    . AUGMENTATION MAMMAPLASTY  Bilateral   . BACK SURGERY    . CORONARY ANGIOPLASTY WITH STENT PLACEMENT  08/02/2018  . CORONARY STENT INTERVENTION N/A 08/02/2018   Procedure: CORONARY STENT INTERVENTION;  Surgeon: Adrian Prows, MD;  Location: [place] [facility];  Service: Cardiovascular;  Laterality: N/A;  RCA   . CYSTOSCOPY W/ STONE MANIPULATION    . INTRAVASCULAR PRESSURE WIRE/FFR STUDY N/A 10/07/2018   Procedure: INTRAVASCULAR PRESSURE WIRE/FFR STUDY;  Surgeon: Adrian Prows, MD;  Location: [place] [facility];  Service: Cardiovascular;  Laterality: N/A;  . KNEE ARTHROSCOPY Right   . LAPAROSCOPIC CHOLECYSTECTOMY    . LEFT HEART CATH AND CORONARY ANGIOGRAPHY N/A 10/07/2018   Procedure: LEFT HEART CATH AND CORONARY ANGIOGRAPHY;  Surgeon: Adrian Prows, MD;  Location: [place] [facility];  Service: Cardiovascular;  Laterality: N/A;  . REPAIR ANKLE LIGAMENT Left    "tied up ligament; had tore up qthing in my ankle"  . RIGHT HEART CATH  10/07/2018  . RIGHT/LEFT HEART CATH AND CORONARY ANGIOGRAPHY N/A 08/02/2018   Procedure: RIGHT/LEFT HEART CATH AND CORONARY ANGIOGRAPHY;  Surgeon: Adrian Prows, MD;  Location: [place] [facility];  Service: Cardiovascular;  Laterality: N/A;  . SHOULDER SURGERY     bilateral  . TONSILLECTOMY    . TUBAL LIGATION      There were no  vitals filed for this visit.  Subjective Assessment - 07/11/19 0923    Subjective  Pt arrived with [facility], stated she feels more stable with AD.  Denies any pain or recent fall.  Reports compliance iwth HEP daily.  Pt proud of her nail job that she did all by herself    Pertinent History  Cardiac stent 2019, HTN, Angina    Patient Stated Goals  "Have less pain, walk better"    Currently in Pain?  No/denies                       [facility] Adult PT Treatment/Exercise - 07/11/19 0001      Knee/Hip Exercises: Standing   Heel Raises  20 reps;Both    Heel Raises Limitations  Toe raise on slope    Hip Abduction  10 reps;Both    Abduction Limitations  cueing for form     Lateral Step Up  Both;2 sets;10 reps;Step Height: 6"    Forward Step Up  Both;2 sets;10 reps;Step Height: 6";Hand Hold: 1    Functional Squat  10 reps    Functional Squat Limitations  front of chair for mechanics    SLS  3x Rt 10", Lt 2" max    Gait Training  Gait training no AD, cone rotation      Knee/Hip Exercises: Seated   Sit to Sand  2 sets;10 reps;without UE support;Other (comment)   eccentric control         Balance Exercises - 07/11/19 1007      Balance Exercises: Standing   Tandem Stance  3 reps;30 secs;Eyes open    SLS  Eyes open;15 secs;Intermittent upper extremity support;Solid surface;Other (comment)   3x Rt 10", Lt 2" max   Gait with Head Turns  Forward   gait without AD, cone rotation         PT Short Term Goals - 06/28/19 1539      PT SHORT TERM GOAL #1   Title  Patient will be IND with initial HEP to improve funcitonal outcomes    Time  3    Period  Weeks    Status  New    Target Date  07/19/19      PT SHORT TERM GOAL #2   Title  Patient will have pain in LT knee no greater than 3/10 on average to improve funcitonal mobility    Time  3    Period  Weeks    Status  New    Target Date  07/19/19      PT SHORT TERM GOAL #3   Title  Patient will improve RLE MMT by 1/2 grade to reduce pain and improve functional mobility    Time  3    Period  Weeks    Status  New    Target Date  07/19/19        PT Long Term Goals - 06/28/19 1542      PT LONG TERM GOAL #1   Title  Patient will improve RLE MMT to greater than or equal to 4/5 throuhout to improve funcional mobility and standing tolerance    Time  6    Period  Weeks    Status  New    Target Date  08/09/19      PT LONG TERM GOAL #2   Title  Patient will be able to perform SLS > or equal to 10 seconds on BLE to improve funcitonal mobility and reduce risk  of falls    Time  6    Period  Weeks    Status  New    Target Date  08/09/19      PT LONG TERM GOAL #3   Title  Patient will be able  to walk >30 minutes IND with no increase in pain level to improve funcitonal mobility and ADLs    Time  6    Period  Weeks    Status  New    Target Date  08/09/19            Plan - 07/11/19 1010    Clinical Impression Statement  Continued session focus with LE strengthening and balance training.  Pt presents with improved mechanics ambulating with SPC, continues to demonstrate Trendelenburg gait pattern due to Rt hip and quad weakness.  Added squats for gluteal strengthening and progressed tandem stance to dynamic surface with increased challenge noted requiring intermittent HHA and verbal cueing to look up and improve posture to assist with balance.  No reports of pain through session, was limited by [place].    Personal Factors and Comorbidities  Comorbidity 3+    Comorbidities  HTN, cardiac stent, angina    Examination-Activity Limitations  Locomotion Level;Lift;Transfers;Stand;Stairs;Squat    Examination-Participation Restrictions  Cleaning;Yard Work;Laundry;Community Activity    Stability/Clinical Decision Making  Evolving/Moderate complexity    Clinical Decision Making  Moderate    Rehab Potential  Good    PT Frequency  2x / week    PT Duration  6 weeks    PT Treatment/Interventions  ADLs/Self Care Home Management;Aquatic Therapy;Biofeedback;Cryotherapy;Electrical Stimulation;Moist Heat;Ultrasound;DME Instruction;Gait training;Stair training;Functional mobility training;Therapeutic activities;Therapeutic exercise;Balance training;Manual techniques;Orthotic Fit/Training;Patient/family education;Neuromuscular re-education;Passive range of motion;Joint Manipulations;Taping;Vasopneumatic Device    PT Next Visit Plan  Next session add vector stance and continue with standing balance and LE strengthening for quad and hip strengthening.    PT Home Exercise Plan  10/23.3/20:  Supine bridge and SLR, sidelying clamshell.       Patient will benefit from skilled therapeutic intervention in  order to improve the following deficits and impairments:  Abnormal gait, Decreased endurance, Impaired sensation, Decreased activity tolerance, Decreased strength, Pain, Difficulty walking, Decreased mobility, Decreased balance, Decreased coordination, Improper body mechanics  Visit Diagnosis: Muscle weakness (generalized)  Other abnormalities of gait and mobility     Problem List Patient Active Problem List   Diagnosis Date Noted  . Raynaud's disease without gangrene 10/15/2018  . Angina pectoris ([place]) 10/07/2018  . Essential hypertension 08/03/2018  . CAD (coronary artery disease), native coronary artery 08/03/2018  . Post PTCA 08/02/2018  . Elevated IgE level 07/19/2018  . Dyspnea on exertion 07/19/2018  . Long-term exposure involving bird droppings 07/19/2018  . Iliotibial band syndrome of left side 10/21/2011   Ihor Austin, LPTA; CBIS [phone]  Aldona Lento 07/11/2019, 10:19 AM  [place], [place], [address] Phone: [phone]   Fax:  [phone]  Name: LANDA MULLINAX MRN: [id number] Date of Birth: Oct 07, 1955

## 2019-07-11 NOTE — Therapy (Signed)
[facility] [address] [place], [place], [address] Phone: [phone]   Fax:  [phone]  Occupational Therapy Treatment  Patient Details  Name: Tonya Boyd MRN: [id number] Date of Birth: 07/16/1956 Referring Provider (OT): Dr. Delia HeadyPramod Sethi   Encounter Date: 07/11/2019  OT End of Session - 07/11/19 1252    Visit Number  3    Number of Visits  12    Date for OT Re-Evaluation  08/09/19    Authorization Type  BCBS COMM PPO    OT Start Time  1031    OT Stop Time  1110    OT Time Calculation (min)  39 min    Activity Tolerance  Patient tolerated treatment well    Behavior During Therapy  [facility]       Past Medical History:  Diagnosis Date  . Angina pectoris ([facility])   . Asthma    "as a child and as an adult" (08/02/2018)  . CAD (coronary artery disease), native coronary artery 08/03/2018  . Chronic bronchitis ([facility])   . Chronic kidney disease   . Coronary artery disease   . Family history of adverse reaction to anesthesia    "cousin stopped breathing; he was allergic to the anesthesia" (08/02/2018)  . GERD (gastroesophageal reflux disease)   . High cholesterol   . History of gout   . History of hiatal hernia   . History of kidney stones   . Hypertension 07/2018  . Interstitial cystitis   . NSVT (nonsustained ventricular tachycardia) ([facility]) 07/2018  . Raynaud's disease    "feet and hands" (08/02/2018)  . Raynaud's disease without gangrene 10/15/2018    Past Surgical History:  Procedure Laterality Date  . ABDOMINAL HYSTERECTOMY    . ANTERIOR CERVICAL DECOMP/DISCECTOMY FUSION    . APPENDECTOMY    . AUGMENTATION MAMMAPLASTY Bilateral   . BACK SURGERY    . CORONARY ANGIOPLASTY WITH STENT PLACEMENT  08/02/2018  . CORONARY STENT INTERVENTION N/A 08/02/2018   Procedure: CORONARY STENT INTERVENTION;  Surgeon: Yates DecampGanji, Jay, MD;  [facility];  Service: Cardiovascular;  Laterality: N/A;  RCA    . CYSTOSCOPY W/ STONE MANIPULATION    . INTRAVASCULAR PRESSURE WIRE/FFR STUDY N/A 10/07/2018   Procedure: INTRAVASCULAR PRESSURE WIRE/FFR STUDY;  Surgeon: Yates DecampGanji, Jay, MD;  Location: [facility];  Service: Cardiovascular;  Laterality: N/A;  . KNEE ARTHROSCOPY Right   . LAPAROSCOPIC CHOLECYSTECTOMY    . LEFT HEART CATH AND CORONARY ANGIOGRAPHY N/A 10/07/2018   Procedure: LEFT HEART CATH AND CORONARY ANGIOGRAPHY;  Surgeon: Yates DecampGanji, Jay, MD;  Location: [facility];  Service: Cardiovascular;  Laterality: N/A;  . REPAIR ANKLE LIGAMENT Left    "tied up ligament; had tore up qthing in my ankle"  . RIGHT HEART CATH  10/07/2018  . RIGHT/LEFT HEART CATH AND CORONARY ANGIOGRAPHY N/A 08/02/2018   Procedure: RIGHT/LEFT HEART CATH AND CORONARY ANGIOGRAPHY;  Surgeon: Yates DecampGanji, Jay, MD;  Location: [facility];  Service: Cardiovascular;  Laterality: N/A;  . SHOULDER SURGERY     bilateral  . TONSILLECTOMY    . TUBAL LIGATION      There were no vitals filed for this visit.  Subjective Assessment - 07/11/19 1033    Subjective   S: I was able to do my nails which was a big accomplishment.    Currently in Pain?  No/denies         [facility] OT Assessment - 07/11/19 1033  Assessment   Medical Diagnosis  stroke-like episode      Precautions   Precautions  None               OT Treatments/Exercises (OP) - 07/11/19 1033      Exercises   Exercises  Hand      Hand Exercises   Hand Gripper with Large Beads  all beads gripper at 29#    Hand Gripper with Medium Beads  all beads gripper at 29#    Hand Gripper with Small Beads  all beads gripper at 25#    Sponges  Pt using green clothespin and 3 pt pinch to grasp high resistance sponges and stack into towers of 5. Pt stacked 4 towers with min difficulty. Pt then used lateral pinch to place sponges into bucket. Mod fatigue with lateral pinch, rest breaks provided as needed.       Neurological Re-education Exercises   Shoulder  Flexion  Strengthening;10 reps   1#   Shoulder ABduction  Strengthening;10 reps   1#   Shoulder Protraction  Strengthening;10 reps   1#   Shoulder Horizontal ABduction  Strengthening;10 reps   1#   Shoulder External Rotation  Strengthening;10 reps   1#   Shoulder Internal Rotation  Strengthening;10 reps   1#   Elbow Flexion  Strengthening;10 reps   bicep curl, hammer curl, pronated, 2#   Elbow Extension  Strengthening;10 reps   bicep curl, hammer curl, pronated, 2#   Other Exercises 2  green therapy ball strengthening: chest press, overhead press, flexion, circles each direction, 10X each       Fine Motor Coordination (Hand/Wrist)   Fine Motor Coordination  Manipulating coins    Manipulating coins  Pt holding coins in right hand, working on palm to fingertip translation to bring to fingertips and drop in bucket. No difficulty with task.                OT Short Term Goals - 07/06/19 1022      OT SHORT TERM GOAL #1   Title  Pt will be provided with and educated on HEP to improve RUE use as non-dominant during functional tasks.    Time  3    Period  Weeks    Status  On-going    Target Date  08/09/19      OT SHORT TERM GOAL #2   Title  Pt will improve right grip strength by 8# and pinch strength by 3# to improve ability to hold dishes when washing.    Time  3    Period  Weeks    Status  On-going        OT Long Term Goals - 07/06/19 1022      OT LONG TERM GOAL #1   Title  Pt will increase RUE strength to 4+/5 to improve ability to lift items into overhead cabinets safely using RUE as assist.    Time  6    Period  Weeks    Status  On-going      OT LONG TERM GOAL #2   Title  Pt will increase right grip strength by 15# and pinch strength by 6# to increase ability to carry bags or handheld items during ADLs.    Time  6    Period  Weeks    Status  On-going      OT LONG TERM GOAL #3   Title  Pt will improve fine motor coordination by completing 9 hole peg  test in  under 26" to improve skills required for fixing hair.    Time  6    Period  Weeks    Status  On-going      OT LONG TERM GOAL #4   Title  Pt will decrease pain in RUE to 3/10 or less to improve ability to sleep at night.    Time  6    Period  Weeks    Status  On-going            Plan - 07/11/19 1109    Clinical Impression Statement  A: Pt reports she has been cooking with her right hand and she was able to do her nails with rest breaks occasionally. Pt with significant improvement in strength and activity tolerance today. Pt able to use 1# weight for all shoulder exercises and increased to 2# weight for elbow strengthening. Increased hand gripper resistance for all beads today, added pinch task. Verbal cuing for form and technique.    Body Structure / Function / Physical Skills  ADL;UE functional use;Pain;FMC;GMC;Sensation;IADL;Strength    Plan  P: Continue with RUE strengthening-complete overhead lacing and scapular theraband, increase hand gripper resistance for medium and small beads, tweezer task       Patient will benefit from skilled therapeutic intervention in order to improve the following deficits and impairments:   Body Structure / Function / Physical Skills: ADL, UE functional use, Pain, FMC, GMC, Sensation, IADL, Strength       Visit Diagnosis: Other symptoms and signs involving the musculoskeletal system  Other lack of coordination    Problem List Patient Active Problem List   Diagnosis Date Noted  . Raynaud's disease without gangrene 10/15/2018  . Angina pectoris ([facility]) 10/07/2018  . Essential hypertension 08/03/2018  . CAD (coronary artery disease), native coronary artery 08/03/2018  . Post PTCA 08/02/2018  . Elevated IgE level 07/19/2018  . Dyspnea on exertion 07/19/2018  . Long-term exposure involving bird droppings 07/19/2018  . Iliotibial band syndrome of left side 10/21/2011   Ezra Sites, OTR/L  [phone] 07/11/2019, 12:53 PM  [facility] [address] [place], [place], [address] Phone: [phone]   Fax:  [phone]  Name: Tonya Boyd MRN: [id number] Date of Birth: 02-26-1956

## 2019-07-12 ENCOUNTER — Encounter (HOSPITAL_COMMUNITY): Payer: Self-pay | Admitting: Speech Pathology

## 2019-07-12 ENCOUNTER — Encounter (HOSPITAL_COMMUNITY): Payer: Self-pay | Admitting: Physical Therapy

## 2019-07-12 ENCOUNTER — Ambulatory Visit (HOSPITAL_COMMUNITY): Payer: BC Managed Care – PPO | Admitting: Physical Therapy

## 2019-07-12 ENCOUNTER — Ambulatory Visit (HOSPITAL_COMMUNITY): Payer: BC Managed Care – PPO | Admitting: Speech Pathology

## 2019-07-12 ENCOUNTER — Other Ambulatory Visit: Payer: Self-pay

## 2019-07-12 DIAGNOSIS — R2689 Other abnormalities of gait and mobility: Secondary | ICD-10-CM

## 2019-07-12 DIAGNOSIS — R41841 Cognitive communication deficit: Secondary | ICD-10-CM | POA: Diagnosis not present

## 2019-07-12 DIAGNOSIS — M6281 Muscle weakness (generalized): Secondary | ICD-10-CM

## 2019-07-12 NOTE — Therapy (Signed)
[place], [place], [address] Phone: [phone]   Fax:  [phone]  Speech Language Pathology Treatment  Patient Details  Name: Tonya Boyd MRN: [id number] Date of Birth: 08-11-1956 Referring Provider (SLP): Fernande Bras, MD   Encounter Date: 07/12/2019  End of Session - 07/12/19 1036    Visit Number  6    Number of Visits  9    Date for SLP Re-Evaluation  07/27/19    Authorization Type  BCBS PPO   no deductible, OOP $[id number] met   SLP Start Time  0945    SLP Stop Time   1030    SLP Time Calculation (min)  45 min    Activity Tolerance  Patient tolerated treatment well       Past Medical History:  Diagnosis Date  . Angina pectoris ([place])   . Asthma    "as a child and as an adult" (08/02/2018)  . CAD (coronary artery disease), native coronary artery 08/03/2018  . Chronic bronchitis ([place])   . Chronic kidney disease   . Coronary artery disease   . Family history of adverse reaction to anesthesia    "cousin stopped breathing; he was allergic to the anesthesia" (08/02/2018)  . GERD (gastroesophageal reflux disease)   . High cholesterol   . History of gout   . History of hiatal hernia   . History of kidney stones   . Hypertension 07/2018  . Interstitial cystitis   . NSVT (nonsustained ventricular tachycardia) ([place]) 07/2018  . Raynaud's disease    "feet and hands" (08/02/2018)  . Raynaud's disease without gangrene 10/15/2018    Past Surgical History:  Procedure Laterality Date  . ABDOMINAL HYSTERECTOMY    . ANTERIOR CERVICAL DECOMP/DISCECTOMY FUSION    . APPENDECTOMY    . AUGMENTATION MAMMAPLASTY Bilateral   . BACK SURGERY    . CORONARY ANGIOPLASTY WITH STENT PLACEMENT  08/02/2018  . CORONARY STENT INTERVENTION N/A 08/02/2018   Procedure: CORONARY STENT INTERVENTION;  Surgeon: Adrian Prows, MD;  Location: [place] [facility];  Service: Cardiovascular;  Laterality: N/A;  RCA   . CYSTOSCOPY W/ STONE  MANIPULATION    . INTRAVASCULAR PRESSURE WIRE/FFR STUDY N/A 10/07/2018   Procedure: INTRAVASCULAR PRESSURE WIRE/FFR STUDY;  Surgeon: Adrian Prows, MD;  Location: [place] [facility];  Service: Cardiovascular;  Laterality: N/A;  . KNEE ARTHROSCOPY Right   . LAPAROSCOPIC CHOLECYSTECTOMY    . LEFT HEART CATH AND CORONARY ANGIOGRAPHY N/A 10/07/2018   Procedure: LEFT HEART CATH AND CORONARY ANGIOGRAPHY;  Surgeon: Adrian Prows, MD;  Location: [place] [facility];  Service: Cardiovascular;  Laterality: N/A;  . REPAIR ANKLE LIGAMENT Left    "tied up ligament; had tore up qthing in my ankle"  . RIGHT HEART CATH  10/07/2018  . RIGHT/LEFT HEART CATH AND CORONARY ANGIOGRAPHY N/A 08/02/2018   Procedure: RIGHT/LEFT HEART CATH AND CORONARY ANGIOGRAPHY;  Surgeon: Adrian Prows, MD;  Location: [place] [facility];  Service: Cardiovascular;  Laterality: N/A;  . SHOULDER SURGERY     bilateral  . TONSILLECTOMY    . TUBAL LIGATION      There were no vitals filed for this visit.  Subjective Assessment - 07/12/19 1030    Subjective  "I had to deal with some things this morning."    Currently in Pain?  No/denies        ADULT SLP TREATMENT - 07/12/19 0001      General Information   Behavior/Cognition  Alert;Cooperative;Pleasant  mood    Patient Positioning  Upright in chair    Oral care provided  N/A    HPI  8 year patient with speech difficulty, right sided weakness and numbness from stroke like episode of unclear etiology. MRI brain x 2 negative for stroke. 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 Exam shows nonorganic features.No obvious underlying psychosocial stressors identified.       Treatment Provided   Treatment provided  Cognitive-Linquistic      Pain Assessment   Pain Assessment  No/denies pain      Cognitive-Linquistic Treatment   Treatment focused on  Aphasia;Apraxia;Patient/family/caregiver education    Skilled Treatment  SLP provided skilled treatment targeting  expressive speech via oral reading of nonsense words, sentence generation tasks, self correction, and moderately complex verbal description tasks.      Assessment / Recommendations / Plan   Plan  Continue with current plan of care      Progression Toward Goals   Progression toward goals  Progressing toward goals         SLP Short Term Goals - 07/12/19 1045      SLP SHORT TERM GOAL #1   Title  Pt will produce s-blends in the initial position of words with 90% acc when labeling pictures with min cues for error awareness.    Baseline  25%    Time  4    Period  Weeks    Status  On-going    Target Date  07/27/19      SLP SHORT TERM GOAL #2   Title  Pt will imitate 7+ word sentences with 95% acc with cue for error awareness for proper grammar/syntax and allowance for articulatory errors.    Baseline  Pt substitutes me/I, is/be, etc; 75%    Time  4    Period  Weeks    Status  On-going    Target Date  07/27/19      SLP SHORT TERM GOAL #3   Title  Pt will describe pictures by generating sentences of 5+ words using proper grammar and syntax with 90% acc and min cues.    Baseline  70% acc    Time  4    Period  Weeks    Status  On-going    Target Date  07/27/19      SLP SHORT TERM GOAL #4   Title  Pt will complete functional working memory activities with 90% acc and use of memory strategies when provided mi/mod cues.    Baseline  25% no assist    Time  4    Period  Weeks    Status  On-going    Target Date  07/27/19      SLP SHORT TERM GOAL #5   Title  Continue ongoing assessment of speech changes related to grammar, syntax, and articulatory errors over the next few sessions.    Time  4    Period  Weeks    Status  On-going    Target Date  07/27/19       SLP Long Term Goals - 07/12/19 1048      SLP LONG TERM GOAL #1   Title  Same as short term goals       Plan - 07/12/19 1037    Clinical Impression Statement Pt continues to make excellent progress toward goals. Her  speech sounds more fluent and with less grammatical errors. Continue to target goals.    Speech Therapy Frequency  2x /  week    Duration  4 weeks    Treatment/Interventions  Cueing hierarchy;SLP instruction and feedback;Compensatory techniques;Compensatory strategies;Functional tasks;Patient/family education;Multimodal communcation approach    Potential to Achieve Goals  Good    SLP Home Exercise Plan  Pt will complete HEP as assigned to facilitate carryover of treatment strategies and techniques in home environment independently.    Consulted and Agree with Plan of Care  Patient       Patient will benefit from skilled therapeutic intervention in order to improve the following deficits and impairments:   Cognitive communication deficit    Problem List Patient Active Problem List   Diagnosis Date Noted  . Raynaud's disease without gangrene 10/15/2018  . Angina pectoris ([place]) 10/07/2018  . Essential hypertension 08/03/2018  . CAD (coronary artery disease), native coronary artery 08/03/2018  . Post PTCA 08/02/2018  . Elevated IgE level 07/19/2018  . Dyspnea on exertion 07/19/2018  . Long-term exposure involving bird droppings 07/19/2018  . Iliotibial band syndrome of left side 10/21/2011   Thank you,  Genene Churn, [place]  [facility] 07/12/2019, 10:51 AM  [place], [place], [address] Phone: [phone]   Fax:  [phone]   Name: Tonya Boyd MRN: [id number] Date of Birth: 01-03-1956

## 2019-07-12 NOTE — Therapy (Signed)
[place], [place], [address] Phone: [phone]   Fax:  [phone]  Physical Therapy Treatment  Patient Details  Name: Tonya Boyd MRN: [id number] Date of Birth: 01/11/1956 Referring Provider (PT): Garvin Fila, MD   Encounter Date: 07/12/2019  PT End of Session - 07/12/19 1045    Visit Number  5    Number of Visits  12    Date for PT Re-Evaluation  08/09/19   Minireassess 07/19/19   Authorization Type  BCBS MET dedcutible and OOPvisits are combined with PT/OT/Chiro - 30 total per benefit period    Authorization Time Period  06/28/19-08/09/19    Authorization - Visit Number  5    Authorization - Number of Visits  12    PT Start Time  3888    PT Stop Time  1111    PT Time Calculation (min)  39 min    Equipment Utilized During Treatment  --    Activity Tolerance  Patient tolerated treatment well    Behavior During Therapy  [facility] for tasks assessed/performed       Past Medical History:  Diagnosis Date  . Angina pectoris ([place])   . Asthma    "as a child and as an adult" (08/02/2018)  . CAD (coronary artery disease), native coronary artery 08/03/2018  . Chronic bronchitis ([place])   . Chronic kidney disease   . Coronary artery disease   . Family history of adverse reaction to anesthesia    "cousin stopped breathing; he was allergic to the anesthesia" (08/02/2018)  . GERD (gastroesophageal reflux disease)   . High cholesterol   . History of gout   . History of hiatal hernia   . History of kidney stones   . Hypertension 07/2018  . Interstitial cystitis   . NSVT (nonsustained ventricular tachycardia) ([place]) 07/2018  . Raynaud's disease    "feet and hands" (08/02/2018)  . Raynaud's disease without gangrene 10/15/2018    Past Surgical History:  Procedure Laterality Date  . ABDOMINAL HYSTERECTOMY    . ANTERIOR CERVICAL DECOMP/DISCECTOMY FUSION    . APPENDECTOMY    . AUGMENTATION MAMMAPLASTY Bilateral    . BACK SURGERY    . CORONARY ANGIOPLASTY WITH STENT PLACEMENT  08/02/2018  . CORONARY STENT INTERVENTION N/A 08/02/2018   Procedure: CORONARY STENT INTERVENTION;  Surgeon: Adrian Prows, MD;  Location: [place];  Service: Cardiovascular;  Laterality: N/A;  RCA   . CYSTOSCOPY W/ STONE MANIPULATION    . INTRAVASCULAR PRESSURE WIRE/FFR STUDY N/A 10/07/2018   Procedure: INTRAVASCULAR PRESSURE WIRE/FFR STUDY;  Surgeon: Adrian Prows, MD;  Location: [facility];  Service: Cardiovascular;  Laterality: N/A;  . KNEE ARTHROSCOPY Right   . LAPAROSCOPIC CHOLECYSTECTOMY    . LEFT HEART CATH AND CORONARY ANGIOGRAPHY N/A 10/07/2018   Procedure: LEFT HEART CATH AND CORONARY ANGIOGRAPHY;  Surgeon: Adrian Prows, MD;  Location: [facility];  Service: Cardiovascular;  Laterality: N/A;  . REPAIR ANKLE LIGAMENT Left    "tied up ligament; had tore up qthing in my ankle"  . RIGHT HEART CATH  10/07/2018  . RIGHT/LEFT HEART CATH AND CORONARY ANGIOGRAPHY N/A 08/02/2018   Procedure: RIGHT/LEFT HEART CATH AND CORONARY ANGIOGRAPHY;  Surgeon: Adrian Prows, MD;  Location: [facility];  Service: Cardiovascular;  Laterality: N/A;  . SHOULDER SURGERY     bilateral  . TONSILLECTOMY    . TUBAL LIGATION      There were no vitals  filed for this visit.  Subjective Assessment - 07/12/19 1030    Subjective  She reports she is sore from the exercises the other day. Her L knee has been bothering her. Walking with her cane has been going well. She denies trips, falls, or losses of balance.    Pertinent History  Cardiac stent 2019, HTN, Angina    Limitations  Standing;Walking;House hold activities;Other (comment)    How long can you stand comfortably?  10 minutes    How long can you walk comfortably?  Not sure    Diagnostic tests  MRI    Patient Stated Goals  "Have less pain, walk better"    Currently in Pain?  No/denies    Pain Onset  1 to 4 weeks ago                       [facility] Adult PT  Treatment/Exercise - 07/12/19 0001      Ambulation/Gait   Ambulation/Gait  Yes    Ambulation/Gait Assistance  5: Supervision    Ambulation/Gait Assistance Details  Patient ambulates while rotating cones, begins dragging RLE after 150 feet, occasional stubblling and catching RLE, difficulty coordingating with dual tasks    Ambulation Distance (Feet)  460 Feet    Gait Pattern  Poor foot clearance - right    Ambulation Surface  Level      Knee/Hip Exercises: Standing   Lateral Step Up  1 set;5 reps;Both   unable to complete with slow controlled decent   Forward Step Up  Both;2 sets;Step Height: 6";Hand Hold: 1;15 reps    Forward Step Up Limitations  unable to perform with contralateral knee drive    Other Standing Knee Exercises  lateral stepping 6 x 15 feet bilateral          Balance Exercises - 07/12/19 1114      Balance Exercises: Standing   Tandem Stance  3 reps;30 secs;Eyes open   with each leg back   SLS with Vectors  3 reps;Upper extremity assist 1;Solid surface;Other (comment)   3 sets   Tandem Gait  Other (comment)   too difficult         PT Short Term Goals - 06/28/19 1539      PT SHORT TERM GOAL #1   Title  Patient will be IND with initial HEP to improve funcitonal outcomes    Time  3    Period  Weeks    Status  New    Target Date  07/19/19      PT SHORT TERM GOAL #2   Title  Patient will have pain in LT knee no greater than 3/10 on average to improve funcitonal mobility    Time  3    Period  Weeks    Status  New    Target Date  07/19/19      PT SHORT TERM GOAL #3   Title  Patient will improve RLE MMT by 1/2 grade to reduce pain and improve functional mobility    Time  3    Period  Weeks    Status  New    Target Date  07/19/19        PT Long Term Goals - 06/28/19 1542      PT LONG TERM GOAL #1   Title  Patient will improve RLE MMT to greater than or equal to 4/5 throuhout to improve funcional mobility and standing tolerance    Time  6  Period  Weeks    Status  New    Target Date  08/09/19      PT LONG TERM GOAL #2   Title  Patient will be able to perform SLS > or equal to 10 seconds on BLE to improve funcitonal mobility and reduce risk of falls    Time  6    Period  Weeks    Status  New    Target Date  08/09/19      PT LONG TERM GOAL #3   Title  Patient will be able to walk >30 minutes IND with no increase in pain level to improve funcitonal mobility and ADLs    Time  6    Period  Weeks    Status  New    Target Date  08/09/19            Plan - 07/12/19 1123    Clinical Impression Statement  Patient unable to perform step up with contralateral knee drive secondary to impaired balance and difficulty coordinating/sequencing movement. She is able to perform step up with frequent verbal cueing for sequencing which leg she is using. She is unable to perform lateral step downs secondary to impaired quad strength bilaterally. She is off balance with tandem gait and demonstrates significantly impaired dynamic balance. She also has moderate sway with static tandem stance. She requires moderate amounts of verbal cueing to limit UE use with all exercises and demonstrates impaired balance throughout session especially during dual tasks. Patient will continue to benefit from skilled physical therapy in order to reduce impairment and improve function.    Personal Factors and Comorbidities  Comorbidity 3+    Comorbidities  HTN, cardiac stent, angina    Examination-Activity Limitations  Locomotion Level;Lift;Transfers;Stand;Stairs;Squat    Examination-Participation Restrictions  Cleaning;Yard Work;Laundry;Community Activity    Stability/Clinical Decision Making  Evolving/Moderate complexity    Rehab Potential  Good    PT Frequency  2x / week    PT Duration  6 weeks    PT Treatment/Interventions  ADLs/Self Care Home Management;Aquatic Therapy;Biofeedback;Cryotherapy;Electrical Stimulation;Moist Heat;Ultrasound;DME Instruction;Gait  training;Stair training;Functional mobility training;Therapeutic activities;Therapeutic exercise;Balance training;Manual techniques;Orthotic Fit/Training;Patient/family education;Neuromuscular re-education;Passive range of motion;Joint Manipulations;Taping;Vasopneumatic Device    PT Next Visit Plan  Continue with standing balance and LE strengthening for quad and hip strengthening.    PT Home Exercise Plan  10/23.3/20:  Supine bridge and SLR, sidelying clamshell.       Patient will benefit from skilled therapeutic intervention in order to improve the following deficits and impairments:  Abnormal gait, Decreased endurance, Impaired sensation, Decreased activity tolerance, Decreased strength, Pain, Difficulty walking, Decreased mobility, Decreased balance, Decreased coordination, Improper body mechanics  Visit Diagnosis: Muscle weakness (generalized)  Other abnormalities of gait and mobility     Problem List Patient Active Problem List   Diagnosis Date Noted  . Raynaud's disease without gangrene 10/15/2018  . Angina pectoris ([place]) 10/07/2018  . Essential hypertension 08/03/2018  . CAD (coronary artery disease), native coronary artery 08/03/2018  . Post PTCA 08/02/2018  . Elevated IgE level 07/19/2018  . Dyspnea on exertion 07/19/2018  . Long-term exposure involving bird droppings 07/19/2018  . Iliotibial band syndrome of left side 10/21/2011    11:42 AM, 07/12/19 Mearl Latin PT, DPT Physical Therapist at [place], [place], [address] Phone: [phone]   Fax:  [phone]  Name: Tonya Boyd MRN: [id number] Date of Birth: Jan 06, 1956

## 2019-07-13 ENCOUNTER — Ambulatory Visit (HOSPITAL_COMMUNITY): Payer: BC Managed Care – PPO | Admitting: Occupational Therapy

## 2019-07-13 ENCOUNTER — Encounter (HOSPITAL_COMMUNITY): Payer: Self-pay | Admitting: Occupational Therapy

## 2019-07-13 DIAGNOSIS — R29898 Other symptoms and signs involving the musculoskeletal system: Secondary | ICD-10-CM

## 2019-07-13 DIAGNOSIS — R278 Other lack of coordination: Secondary | ICD-10-CM

## 2019-07-13 DIAGNOSIS — M79601 Pain in right arm: Secondary | ICD-10-CM

## 2019-07-13 DIAGNOSIS — R41841 Cognitive communication deficit: Secondary | ICD-10-CM | POA: Diagnosis not present

## 2019-07-13 NOTE — Therapy (Signed)
[facility] [address] [place], [place], [address] Phone: [phone]   Fax:  [phone]  Occupational Therapy Treatment  Patient Details  Name: Tonya Boyd MRN: [id number] Date of Birth: July 08, 1956 Referring Provider (OT): Dr. Delia Heady   Encounter Date: 07/13/2019  OT End of Session - 07/13/19 1159    Visit Number  4    Number of Visits  12    Date for OT Re-Evaluation  08/09/19    Authorization Type  BCBS COMM PPO    OT Start Time  1124   pt arrived late   OT Stop Time  1204    OT Time Calculation (min)  40 min    Activity Tolerance  Patient tolerated treatment well    Behavior During Therapy  [facility] for tasks assessed/performed       Past Medical History:  Diagnosis Date  . Angina pectoris ([facility])   . Asthma    "as a child and as an adult" (08/02/2018)  . CAD (coronary artery disease), native coronary artery 08/03/2018  . Chronic bronchitis ([facility])   . Chronic kidney disease   . Coronary artery disease   . Family history of adverse reaction to anesthesia    "cousin stopped breathing; he was allergic to the anesthesia" (08/02/2018)  . GERD (gastroesophageal reflux disease)   . High cholesterol   . History of gout   . History of hiatal hernia   . History of kidney stones   . Hypertension 07/2018  . Interstitial cystitis   . NSVT (nonsustained ventricular tachycardia) ([facility]) 07/2018  . Raynaud's disease    "feet and hands" (08/02/2018)  . Raynaud's disease without gangrene 10/15/2018    Past Surgical History:  Procedure Laterality Date  . ABDOMINAL HYSTERECTOMY    . ANTERIOR CERVICAL DECOMP/DISCECTOMY FUSION    . APPENDECTOMY    . AUGMENTATION MAMMAPLASTY Bilateral   . BACK SURGERY    . CORONARY ANGIOPLASTY WITH STENT PLACEMENT  08/02/2018  . CORONARY STENT INTERVENTION N/A 08/02/2018   Procedure: CORONARY STENT INTERVENTION;  Surgeon: [name], MD;  [facility];  Service: Cardiovascular;   Laterality: N/A;  RCA   . CYSTOSCOPY W/ STONE MANIPULATION    . INTRAVASCULAR PRESSURE WIRE/FFR STUDY N/A 10/07/2018   Procedure: INTRAVASCULAR PRESSURE WIRE/FFR STUDY;  Surgeon: [name], MD;  Location: [facility];  Service: Cardiovascular;  Laterality: N/A;  . KNEE ARTHROSCOPY Right   . LAPAROSCOPIC CHOLECYSTECTOMY    . LEFT HEART CATH AND CORONARY ANGIOGRAPHY N/A 10/07/2018   Procedure: LEFT HEART CATH AND CORONARY ANGIOGRAPHY;  Surgeon: [name], MD;  Location: [facility];  Service: Cardiovascular;  Laterality: N/A;  . REPAIR ANKLE LIGAMENT Left    "tied up ligament; had tore up qthing in my ankle"  . RIGHT HEART CATH  10/07/2018  . RIGHT/LEFT HEART CATH AND CORONARY ANGIOGRAPHY N/A 08/02/2018   Procedure: RIGHT/LEFT HEART CATH AND CORONARY ANGIOGRAPHY;  Surgeon: [name], MD;  Location: [facility];  Service: Cardiovascular;  Laterality: N/A;  . SHOULDER SURGERY     bilateral  . TONSILLECTOMY    . TUBAL LIGATION      There were no vitals filed for this visit.  Subjective Assessment - 07/13/19 1124    Subjective   S: I've been up and swept and mopped my house this morning.    Currently in Pain?  No/denies         [facility] OT Assessment -  07/13/19 1124      Assessment   Medical Diagnosis  stroke-like episode      Precautions   Precautions  None               OT Treatments/Exercises (OP) - 07/13/19 1125      Exercises   Exercises  Hand;Shoulder      Shoulder Exercises: ROM/Strengthening   Over Head Lace  2' seated      Shoulder Exercises: Stretch   Elbow Flexion  Strengthening;15 reps   bicep curl, hammer curl, pronated, 2#     Hand Exercises   Hand Gripper with Large Beads  all beads gripper at 38#    Hand Gripper with Medium Beads  all beads gripper at 35#    Hand Gripper with Small Beads  all beads gripper at 25#      Neurological Re-education Exercises   Shoulder Flexion  Strengthening;15 reps   1#   Shoulder ABduction   Strengthening;15 reps   1#   Shoulder Protraction  Strengthening;15 reps   1#   Shoulder Horizontal ABduction  Strengthening;15 reps   1#   Shoulder External Rotation  Strengthening;15 reps   1#   Shoulder Internal Rotation  Strengthening;15 reps   1#   Elbow Extension  Strengthening;15 reps   hammer curl, bicep curl, pronated 2#   Other Exercises 1  scapular theraband: extension, row, retraction, 15X red band      Fine Motor Coordination (Hand/Wrist)   Fine Motor Coordination  Grooved pegs    Grooved pegs  Pt placed 5 grooved pegs into pegboard holding multiple pegs in hand without difficulty. Pt then used tweezers to place remaining pegs into pegboard with mod difficulty with coordination.  Increased time required to complete task.                OT Short Term Goals - 07/06/19 1022      OT SHORT TERM GOAL #1   Title  Pt will be provided with and educated on HEP to improve RUE use as non-dominant during functional tasks.    Time  3    Period  Weeks    Status  On-going    Target Date  08/09/19      OT SHORT TERM GOAL #2   Title  Pt will improve right grip strength by 8# and pinch strength by 3# to improve ability to hold dishes when washing.    Time  3    Period  Weeks    Status  On-going        OT Long Term Goals - 07/06/19 1022      OT LONG TERM GOAL #1   Title  Pt will increase RUE strength to 4+/5 to improve ability to lift items into overhead cabinets safely using RUE as assist.    Time  6    Period  Weeks    Status  On-going      OT LONG TERM GOAL #2   Title  Pt will increase right grip strength by 15# and pinch strength by 6# to increase ability to carry bags or handheld items during ADLs.    Time  6    Period  Weeks    Status  On-going      OT LONG TERM GOAL #3   Title  Pt will improve fine motor coordination by completing 9 hole peg test in under 26" to improve skills required for fixing hair.    Time  6  Period  Weeks    Status  On-going       OT LONG TERM GOAL #4   Title  Pt will decrease pain in RUE to 3/10 or less to improve ability to sleep at night.    Time  6    Period  Weeks    Status  On-going            Plan - 07/13/19 1132    Clinical Impression Statement  A: Pt reports she has been cleaning her house this morning. Continued with RUE strengthening today, increasing repetitions to 15. Attempted to increase weight to 3# with elbow strengthening however reduced to 2# as pt was having pain in elbow. Added overhead lacing and resumed scapular theraband. Added grooved pegboard task using tweezers and increased hand gripper resistance. Verbal cuing for form and technique, as well as for speed.    Body Structure / Function / Physical Skills  ADL;UE functional use;Pain;FMC;GMC;Sensation;IADL;Strength    Plan  P: Continue working on RUE strengthening and grip strength, complete pinch task, theraputty using pvc pipe for grip and stability       Patient will benefit from skilled therapeutic intervention in order to improve the following deficits and impairments:   Body Structure / Function / Physical Skills: ADL, UE functional use, Pain, FMC, GMC, Sensation, IADL, Strength       Visit Diagnosis: Other symptoms and signs involving the musculoskeletal system  Other lack of coordination  Pain in right arm    Problem List Patient Active Problem List   Diagnosis Date Noted  . Raynaud's disease without gangrene 10/15/2018  . Angina pectoris ([place]) 10/07/2018  . Essential hypertension 08/03/2018  . CAD (coronary artery disease), native coronary artery 08/03/2018  . Post PTCA 08/02/2018  . Elevated IgE level 07/19/2018  . Dyspnea on exertion 07/19/2018  . Long-term exposure involving bird droppings 07/19/2018  . Iliotibial band syndrome of left side 10/21/2011   Guadelupe Sabin, OTR/L  [phone] 07/13/2019, 12:04 PM  [facility] [address] [place], [place],  [address] Phone: [phone]   Fax:  [phone]  Name: Tonya Boyd MRN: [id number] Date of Birth: 04/22/1956

## 2019-07-14 ENCOUNTER — Other Ambulatory Visit: Payer: Self-pay | Admitting: Cardiology

## 2019-07-17 ENCOUNTER — Ambulatory Visit (HOSPITAL_COMMUNITY): Payer: BC Managed Care – PPO | Admitting: Physical Therapy

## 2019-07-17 ENCOUNTER — Encounter (HOSPITAL_COMMUNITY): Payer: Self-pay | Admitting: Speech Pathology

## 2019-07-17 ENCOUNTER — Encounter (HOSPITAL_COMMUNITY): Payer: Self-pay | Admitting: Physical Therapy

## 2019-07-17 ENCOUNTER — Other Ambulatory Visit: Payer: Self-pay

## 2019-07-17 ENCOUNTER — Ambulatory Visit (HOSPITAL_COMMUNITY): Payer: BC Managed Care – PPO | Admitting: Speech Pathology

## 2019-07-17 DIAGNOSIS — R2689 Other abnormalities of gait and mobility: Secondary | ICD-10-CM

## 2019-07-17 DIAGNOSIS — R41841 Cognitive communication deficit: Secondary | ICD-10-CM | POA: Diagnosis not present

## 2019-07-17 DIAGNOSIS — M6281 Muscle weakness (generalized): Secondary | ICD-10-CM

## 2019-07-17 NOTE — Therapy (Signed)
[place], [place], [address] Phone: [phone]   Fax:  [phone]  Physical Therapy Treatment  Patient Details  Name: Tonya Boyd MRN: [id number] Date of Birth: [birth year] Referring Provider (PT): Garvin Fila, MD   Encounter Date: 07/17/2019  PT End of Session - 07/17/19 1213    Visit Number  6    Number of Visits  12    Date for PT Re-Evaluation  08/09/19    Authorization Type  BCBS MET dedcutible and OOPvisits are combined with PT/OT/Chiro - 30 total per benefit period    Authorization Time Period  06/28/19-08/09/19    Authorization - Visit Number  6    Authorization - Number of Visits  12    PT Start Time  1119    PT Stop Time  1159    PT Time Calculation (min)  40 min    Equipment Utilized During Treatment  Gait belt    Activity Tolerance  Patient tolerated treatment well;Patient limited by pain       Past Medical History:  Diagnosis Date  . Angina pectoris ([place])   . Asthma    "as a child and as an adult" (08/02/2018)  . CAD (coronary artery disease), native coronary artery 08/03/2018  . Chronic bronchitis ([place])   . Chronic kidney disease   . Coronary artery disease   . Family history of adverse reaction to anesthesia    "cousin stopped breathing; he was allergic to the anesthesia" (08/02/2018)  . GERD (gastroesophageal reflux disease)   . High cholesterol   . History of gout   . History of hiatal hernia   . History of kidney stones   . Hypertension 07/2018  . Interstitial cystitis   . NSVT (nonsustained ventricular tachycardia) ([place]) 07/2018  . Raynaud's disease    "feet and hands" (08/02/2018)  . Raynaud's disease without gangrene 10/15/2018    Past Surgical History:  Procedure Laterality Date  . ABDOMINAL HYSTERECTOMY    . ANTERIOR CERVICAL DECOMP/DISCECTOMY FUSION    . APPENDECTOMY    . AUGMENTATION MAMMAPLASTY Bilateral   . BACK SURGERY    . CORONARY ANGIOPLASTY WITH STENT  PLACEMENT  08/02/2018  . CORONARY STENT INTERVENTION N/A 08/02/2018   Procedure: CORONARY STENT INTERVENTION;  Surgeon: Adrian Prows, MD;  Location: [facility];  Service: Cardiovascular;  Laterality: N/A;  RCA   . CYSTOSCOPY W/ STONE MANIPULATION    . INTRAVASCULAR PRESSURE WIRE/FFR STUDY N/A 10/07/2018   Procedure: INTRAVASCULAR PRESSURE WIRE/FFR STUDY;  Surgeon: Adrian Prows, MD;  Location: [facility];  Service: Cardiovascular;  Laterality: N/A;  . KNEE ARTHROSCOPY Right   . LAPAROSCOPIC CHOLECYSTECTOMY    . LEFT HEART CATH AND CORONARY ANGIOGRAPHY N/A 10/07/2018   Procedure: LEFT HEART CATH AND CORONARY ANGIOGRAPHY;  Surgeon: Adrian Prows, MD;  Location: [facility];  Service: Cardiovascular;  Laterality: N/A;  . REPAIR ANKLE LIGAMENT Left    "tied up ligament; had tore up qthing in my ankle"  . RIGHT HEART CATH  10/07/2018  . RIGHT/LEFT HEART CATH AND CORONARY ANGIOGRAPHY N/A 08/02/2018   Procedure: RIGHT/LEFT HEART CATH AND CORONARY ANGIOGRAPHY;  Surgeon: Adrian Prows, MD;  Location: [facility];  Service: Cardiovascular;  Laterality: N/A;  . SHOULDER SURGERY     bilateral  . TONSILLECTOMY    . TUBAL LIGATION      There were no vitals filed for this visit.  Subjective Assessment - 07/17/19 1211  Subjective  "Can't do exercise today because my knees starting to hurt. I think its my shoes"    Pertinent History  Cardiac stent 2019, HTN, Angina    Limitations  Standing;Walking;House hold activities;Other (comment)    How long can you stand comfortably?  10 minutes    How long can you walk comfortably?  Not sure    Diagnostic tests  MRI    Patient Stated Goals  "Have less pain, walk better"    Currently in Pain?  Yes    Pain Score  8     Pain Location  Knee    Pain Orientation  Right;Left;Anterior    Pain Descriptors / Indicators  Aching    Pain Onset  1 to 4 weeks ago    Pain Onset  1 to 4 weeks ago                       [facility] Adult PT  Treatment/Exercise - 07/17/19 0001      Ambulation/Gait   Ambulation/Gait Assistance  5: Supervision    Ambulation Distance (Feet)  452 Feet    Assistive device  Straight cane      Knee/Hip Exercises: Standing   Hip ADduction  Both;15 reps    Hip Extension  Both;15 reps    Forward Step Up  Both;10 reps;Hand Hold: 1;Step Height: 8"    SLS  3 x 15", both, solid surface   intermittnet HHA x1         Balance Exercises - 07/17/19 1220      Balance Exercises: Standing   Tandem Stance  3 reps;30 secs;Eyes open;Foam/compliant surface    Tandem Gait  2 reps   15' on blue line   Turning  --   turn/ pivot, 2 reps on blue line   Other Standing Exercises  NBOS on foam, EO/ EC; 3 x30" each; NBOS on foam with head turns up/down; LT/RT; 10 x each          PT Short Term Goals - 06/28/19 1539      PT SHORT TERM GOAL #1   Title  Patient will be IND with initial HEP to improve funcitonal outcomes    Time  3    Period  Weeks    Status  New    Target Date  07/19/19      PT SHORT TERM GOAL #2   Title  Patient will have pain in LT knee no greater than 3/10 on average to improve funcitonal mobility    Time  3    Period  Weeks    Status  New    Target Date  07/19/19      PT SHORT TERM GOAL #3   Title  Patient will improve RLE MMT by 1/2 grade to reduce pain and improve functional mobility    Time  3    Period  Weeks    Status  New    Target Date  07/19/19        PT Long Term Goals - 06/28/19 1542      PT LONG TERM GOAL #1   Title  Patient will improve RLE MMT to greater than or equal to 4/5 throuhout to improve funcional mobility and standing tolerance    Time  6    Period  Weeks    Status  New    Target Date  08/09/19      PT LONG TERM GOAL #2   Title  Patient will be able to perform SLS > or equal to 10 seconds on BLE to improve funcitonal mobility and reduce risk of falls    Time  6    Period  Weeks    Status  New    Target Date  08/09/19      PT LONG TERM GOAL #3    Title  Patient will be able to walk >30 minutes IND with no increase in pain level to improve funcitonal mobility and ADLs    Time  6    Period  Weeks    Status  New    Target Date  08/09/19            Plan - 07/17/19 1214    Clinical Impression Statement  Patient had increased knee pain with forward step ups on 8 inch box today so lateral step ups and squatting activity were held. Patient did well with static balance activity today, but was challenged with progression to balance on compliant surface, and with head movements. Patient was also challanged with dynamaic balance with gait, but had no LOB, using SPC. Patient was able to progress ambualtion distance with minmal complaint of pain in knees. Will resume knee strengthening exercise as tolerated.    Personal Factors and Comorbidities  Comorbidity 3+    Comorbidities  HTN, cardiac stent, angina    Examination-Activity Limitations  Locomotion Level;Lift;Transfers;Stand;Stairs;Squat    Examination-Participation Restrictions  Cleaning;Yard Work;Laundry;Community Activity    Stability/Clinical Decision Making  Evolving/Moderate complexity    Rehab Potential  Good    PT Frequency  2x / week    PT Duration  6 weeks    PT Treatment/Interventions  ADLs/Self Care Home Management;Aquatic Therapy;Biofeedback;Cryotherapy;Electrical Stimulation;Moist Heat;Ultrasound;DME Instruction;Gait training;Stair training;Functional mobility training;Therapeutic activities;Therapeutic exercise;Balance training;Manual techniques;Orthotic Fit/Training;Patient/family education;Neuromuscular re-education;Passive range of motion;Joint Manipulations;Taping;Vasopneumatic Device    PT Next Visit Plan  Continue with standing balance and LE strengthening for quad and hip strengthening.    PT Home Exercise Plan  10/23.3/20:  Supine bridge and SLR, sidelying clamshell.       Patient will benefit from skilled therapeutic intervention in order to improve the following  deficits and impairments:  Abnormal gait, Decreased endurance, Impaired sensation, Decreased activity tolerance, Decreased strength, Pain, Difficulty walking, Decreased mobility, Decreased balance, Decreased coordination, Improper body mechanics  Visit Diagnosis: Muscle weakness (generalized)  Other abnormalities of gait and mobility     Problem List Patient Active Problem List   Diagnosis Date Noted  . Raynaud's disease without gangrene 10/15/2018  . Angina pectoris ([place]) 10/07/2018  . Essential hypertension 08/03/2018  . CAD (coronary artery disease), native coronary artery 08/03/2018  . Post PTCA 08/02/2018  . Elevated IgE level 07/19/2018  . Dyspnea on exertion 07/19/2018  . Long-term exposure involving bird droppings 07/19/2018  . Iliotibial band syndrome of left side 10/21/2011    12:27 PM, 07/17/19 Josue Hector PT DPT  Physical Therapist with [facility]  [phone] [place] [address] [place], [place], [address] Phone: [phone]   Fax:  [phone]  Name: JYRA LAGARES MRN: [id number] Date of Birth: 06/28/1956

## 2019-07-17 NOTE — Therapy (Signed)
[place], [place], [address] Phone: [phone]   Fax:  [phone]  Speech Language Pathology Treatment  Patient Details  Name: Tonya Boyd MRN: [id number] Date of Birth: August 29, 1956 Referring Provider (SLP): Fernande Bras, MD   Encounter Date: 07/17/2019  End of Session - 07/17/19 1035    Visit Number  7    Number of Visits  9    Date for SLP Re-Evaluation  07/27/19    Authorization Type  BCBS PPO   no deductible, OOP $[id number] met   SLP Start Time  1030    SLP Stop Time   1115    SLP Time Calculation (min)  45 min    Activity Tolerance  Patient tolerated treatment well       Past Medical History:  Diagnosis Date  . Angina pectoris ([place])   . Asthma    "as a child and as an adult" (08/02/2018)  . CAD (coronary artery disease), native coronary artery 08/03/2018  . Chronic bronchitis ([place])   . Chronic kidney disease   . Coronary artery disease   . Family history of adverse reaction to anesthesia    "cousin stopped breathing; he was allergic to the anesthesia" (08/02/2018)  . GERD (gastroesophageal reflux disease)   . High cholesterol   . History of gout   . History of hiatal hernia   . History of kidney stones   . Hypertension 07/2018  . Interstitial cystitis   . NSVT (nonsustained ventricular tachycardia) ([place]) 07/2018  . Raynaud's disease    "feet and hands" (08/02/2018)  . Raynaud's disease without gangrene 10/15/2018    Past Surgical History:  Procedure Laterality Date  . ABDOMINAL HYSTERECTOMY    . ANTERIOR CERVICAL DECOMP/DISCECTOMY FUSION    . APPENDECTOMY    . AUGMENTATION MAMMAPLASTY Bilateral   . BACK SURGERY    . CORONARY ANGIOPLASTY WITH STENT PLACEMENT  08/02/2018  . CORONARY STENT INTERVENTION N/A 08/02/2018   Procedure: CORONARY STENT INTERVENTION;  Surgeon: Adrian Prows, MD;  Location: [facility];  Service: Cardiovascular;  Laterality: N/A;  RCA   . CYSTOSCOPY W/ STONE  MANIPULATION    . INTRAVASCULAR PRESSURE WIRE/FFR STUDY N/A 10/07/2018   Procedure: INTRAVASCULAR PRESSURE WIRE/FFR STUDY;  Surgeon: Adrian Prows, MD;  Location: [facility];  Service: Cardiovascular;  Laterality: N/A;  . KNEE ARTHROSCOPY Right   . LAPAROSCOPIC CHOLECYSTECTOMY    . LEFT HEART CATH AND CORONARY ANGIOGRAPHY N/A 10/07/2018   Procedure: LEFT HEART CATH AND CORONARY ANGIOGRAPHY;  Surgeon: Adrian Prows, MD;  Location: [facility];  Service: Cardiovascular;  Laterality: N/A;  . REPAIR ANKLE LIGAMENT Left    "tied up ligament; had tore up qthing in my ankle"  . RIGHT HEART CATH  10/07/2018  . RIGHT/LEFT HEART CATH AND CORONARY ANGIOGRAPHY N/A 08/02/2018   Procedure: RIGHT/LEFT HEART CATH AND CORONARY ANGIOGRAPHY;  Surgeon: Adrian Prows, MD;  Location: [facility];  Service: Cardiovascular;  Laterality: N/A;  . SHOULDER SURGERY     bilateral  . TONSILLECTOMY    . TUBAL LIGATION      There were no vitals filed for this visit.  Subjective Assessment - 07/17/19 1033    Subjective  "It is hard for me to slow down."    Currently in Pain?  No/denies        ADULT SLP TREATMENT - 07/17/19 1034      General Information   Behavior/Cognition  Alert;Cooperative;Pleasant mood  Patient Positioning  Upright in chair    Oral care provided  N/A    HPI  77 year patient with speech difficulty, right sided weakness and numbness from stroke like episode of unclear etiology. MRI brain x 2 negative for stroke. 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 Exam shows nonorganic features.No obvious underlying psychosocial stressors identified.       Treatment Provided   Treatment provided  Cognitive-Linquistic      Pain Assessment   Pain Assessment  No/denies pain      Cognitive-Linquistic Treatment   Treatment focused on  Aphasia;Apraxia;Patient/family/caregiver education    Skilled Treatment  SLP provided skilled treatment targeting oral reading, cues  for error awareness and self correction, identifying synonyms, and accurate speech production of regular past tense verbs (Pt tends to mispronounce -ed).      Assessment / Recommendations / Plan   Plan  Continue with current plan of care      Progression Toward Goals   Progression toward goals  Progressing toward goals         SLP Short Term Goals - 07/17/19 1036      SLP SHORT TERM GOAL #1   Title  Pt will produce s-blends in the initial position of words with 90% acc when labeling pictures with min cues for error awareness.    Baseline  25%    Time  4    Period  Weeks    Status  On-going    Target Date  07/27/19      SLP SHORT TERM GOAL #2   Title  Pt will imitate 7+ word sentences with 95% acc with cue for error awareness for proper grammar/syntax and allowance for articulatory errors.    Baseline  Pt substitutes me/I, is/be, etc; 75%    Time  4    Period  Weeks    Status  On-going    Target Date  07/27/19      SLP SHORT TERM GOAL #3   Title  Pt will describe pictures by generating sentences of 5+ words using proper grammar and syntax with 90% acc and min cues.    Baseline  70% acc    Time  4    Period  Weeks    Status  On-going    Target Date  07/27/19      SLP SHORT TERM GOAL #4   Title  Pt will complete functional working memory activities with 90% acc and use of memory strategies when provided mi/mod cues.    Baseline  25% no assist    Time  4    Period  Weeks    Status  On-going    Target Date  07/27/19      SLP SHORT TERM GOAL #5   Title  Continue ongoing assessment of speech changes related to grammar, syntax, and articulatory errors over the next few sessions.    Time  4    Period  Weeks    Status  On-going    Target Date  07/27/19       SLP Long Term Goals - 07/17/19 1036      SLP LONG TERM GOAL #1   Title  Same as short term goals       Plan - 07/17/19 1035    Clinical Impression Statement Pt alert and engaged for therapy this date. She  reports continued progress at home, but indicates that she has to really concentrate on "slowing down". Pt required less cues  for error awareness and self corrections this date. During production of regular past tense verbs, Pt verbalized "ee dee" instead of "t". Pt given homework and she was encouraged to practice oral and silent reading at home.    Speech Therapy Frequency  2x / week    Duration  4 weeks    Treatment/Interventions  Cueing hierarchy;SLP instruction and feedback;Compensatory techniques;Compensatory strategies;Functional tasks;Patient/family education;Multimodal communcation approach    Potential to Achieve Goals  Good    SLP Home Exercise Plan  Pt will complete HEP as assigned to facilitate carryover of treatment strategies and techniques in home environment independently.    Consulted and Agree with Plan of Care  Patient       Patient will benefit from skilled therapeutic intervention in order to improve the following deficits and impairments:   Cognitive communication deficit    Problem List Patient Active Problem List   Diagnosis Date Noted  . Raynaud's disease without gangrene 10/15/2018  . Angina pectoris ([place]) 10/07/2018  . Essential hypertension 08/03/2018  . CAD (coronary artery disease), native coronary artery 08/03/2018  . Post PTCA 08/02/2018  . Elevated IgE level 07/19/2018  . Dyspnea on exertion 07/19/2018  . Long-term exposure involving bird droppings 07/19/2018  . Iliotibial band syndrome of left side 10/21/2011   Thank you,  Genene Churn, [place]  [facility] 07/17/2019, 10:37 AM  [facility] [address] [place], [place], [address] Phone: [phone]   Fax:  [phone]   Name: Tonya Boyd MRN: [id number] Date of Birth: 10/25/1955

## 2019-07-18 ENCOUNTER — Encounter (HOSPITAL_COMMUNITY): Payer: Self-pay

## 2019-07-18 ENCOUNTER — Telehealth (HOSPITAL_COMMUNITY): Payer: Self-pay | Admitting: Speech Pathology

## 2019-07-18 ENCOUNTER — Ambulatory Visit (HOSPITAL_COMMUNITY): Payer: BC Managed Care – PPO

## 2019-07-18 ENCOUNTER — Telehealth (HOSPITAL_COMMUNITY): Payer: Self-pay | Admitting: Physical Therapy

## 2019-07-18 DIAGNOSIS — R278 Other lack of coordination: Secondary | ICD-10-CM

## 2019-07-18 DIAGNOSIS — R29898 Other symptoms and signs involving the musculoskeletal system: Secondary | ICD-10-CM

## 2019-07-18 DIAGNOSIS — R41841 Cognitive communication deficit: Secondary | ICD-10-CM | POA: Diagnosis not present

## 2019-07-18 DIAGNOSIS — M79601 Pain in right arm: Secondary | ICD-10-CM

## 2019-07-18 NOTE — Telephone Encounter (Signed)
pt came into the office to cancel both appts due to she fell and she said she needs a day to recuperate. °

## 2019-07-18 NOTE — Therapy (Signed)
[facility] [address] [place], [place], [address] Phone: [phone]   Fax:  [phone]  Occupational Therapy Treatment  Patient Details  Name: BRITTENEY AYOTTE MRN: [id number] Date of Birth: 23-Dec-1955 Referring Provider (OT): Dr. Delia Heady   Encounter Date: 07/18/2019  OT End of Session - 07/18/19 0938    Visit Number  5    Number of Visits  12    Date for OT Re-Evaluation  08/09/19    Authorization Type  BCBS COMM PPO    OT Start Time  0901    OT Stop Time  0940    OT Time Calculation (min)  39 min    Activity Tolerance  Patient tolerated treatment well    Behavior During Therapy  [facility] for tasks assessed/performed       Past Medical History:  Diagnosis Date  . Angina pectoris ([facility])   . Asthma    "as a child and as an adult" (08/02/2018)  . CAD (coronary artery disease), native coronary artery 08/03/2018  . Chronic bronchitis ([facility])   . Chronic kidney disease   . Coronary artery disease   . Family history of adverse reaction to anesthesia    "cousin stopped breathing; he was allergic to the anesthesia" (08/02/2018)  . GERD (gastroesophageal reflux disease)   . High cholesterol   . History of gout   . History of hiatal hernia   . History of kidney stones   . Hypertension 07/2018  . Interstitial cystitis   . NSVT (nonsustained ventricular tachycardia) ([facility]) 07/2018  . Raynaud's disease    "feet and hands" (08/02/2018)  . Raynaud's disease without gangrene 10/15/2018    Past Surgical History:  Procedure Laterality Date  . ABDOMINAL HYSTERECTOMY    . ANTERIOR CERVICAL DECOMP/DISCECTOMY FUSION    . APPENDECTOMY    . AUGMENTATION MAMMAPLASTY Bilateral   . BACK SURGERY    . CORONARY ANGIOPLASTY WITH STENT PLACEMENT  08/02/2018  . CORONARY STENT INTERVENTION N/A 08/02/2018   Procedure: CORONARY STENT INTERVENTION;  Surgeon: [name], MD;  [facility];  Service: Cardiovascular;  Laterality: N/A;   RCA   . CYSTOSCOPY W/ STONE MANIPULATION    . INTRAVASCULAR PRESSURE WIRE/FFR STUDY N/A 10/07/2018   Procedure: INTRAVASCULAR PRESSURE WIRE/FFR STUDY;  Surgeon: [name], MD;  [facility]: [facility];  Service: Cardiovascular;  Laterality: N/A;  . KNEE ARTHROSCOPY Right   . LAPAROSCOPIC CHOLECYSTECTOMY    . LEFT HEART CATH AND CORONARY ANGIOGRAPHY N/A 10/07/2018   Procedure: LEFT HEART CATH AND CORONARY ANGIOGRAPHY;  Surgeon: [name], MD;  [facility];  Service: Cardiovascular;  Laterality: N/A;  . REPAIR ANKLE LIGAMENT Left    "tied up ligament; had tore up qthing in my ankle"  . RIGHT HEART CATH  10/07/2018  . RIGHT/LEFT HEART CATH AND CORONARY ANGIOGRAPHY N/A 08/02/2018   Procedure: RIGHT/LEFT HEART CATH AND CORONARY ANGIOGRAPHY;  Surgeon: [name], MD;  [facility];  Service: Cardiovascular;  Laterality: N/A;  . SHOULDER SURGERY     bilateral  . TONSILLECTOMY    . TUBAL LIGATION      There were no vitals filed for this visit.  Subjective Assessment - 07/18/19 0924    Subjective   S: The swing chain broke this morning and I fell on my tailbone. My back is hurting.    Currently in Pain?  Yes    Pain Score  8  Pain Location  Back    Pain Type  Acute pain    Pain Radiating Towards  N/A    Pain Onset  Today    Pain Frequency  Constant    Aggravating Factors   Patient fell off a swing/one of the chains broke.    Pain Relieving Factors  Nothing at the moment.    Effect of Pain on Daily Activities  mod effect    Multiple Pain Sites  No         [facility] OT Assessment - 07/18/19 0925      Assessment   Medical Diagnosis  stroke-like episode      Precautions   Precautions  None               OT Treatments/Exercises (OP) - 07/18/19 0925      Exercises   Exercises  Hand;Theraputty      Hand Exercises   Other Hand Exercises  Pt utilized pvc pipe to push circles into flattened red putty to focus on UB strengthening and grip  strength.       Theraputty   Theraputty - Flatten  red - seated    Theraputty - Roll  red    Theraputty - Grip  red    Theraputty - Pinch  red- 3 point and lateral    Theraputty Hand- Locate Pegs  red 8/8 beads located.      Manual Therapy   Manual Therapy  Myofascial release    Manual therapy comments  Manual therapy completed prior to treatment activities.    Myofascial Release  Myofascial release completed to right epicondyle region of elbow and forearm to decrease fascial restrictions and decrease pain.              OT Education - 07/18/19 [phone]    Education Details  Discussed putting ice on forearm when she returns home for 10 minutes after therapy session.    Person(s) Educated  Patient    Methods  Explanation    Comprehension  Verbalized understanding       OT Short Term Goals - 07/06/19 1022      OT SHORT TERM GOAL #1   Title  Pt will be provided with and educated on HEP to improve RUE use as non-dominant during functional tasks.    Time  3    Period  Weeks    Status  On-going    Target Date  08/09/19      OT SHORT TERM GOAL #2   Title  Pt will improve right grip strength by 8# and pinch strength by 3# to improve ability to hold dishes when washing.    Time  3    Period  Weeks    Status  On-going        OT Long Term Goals - 07/06/19 1022      OT LONG TERM GOAL #1   Title  Pt will increase RUE strength to 4+/5 to improve ability to lift items into overhead cabinets safely using RUE as assist.    Time  6    Period  Weeks    Status  On-going      OT LONG TERM GOAL #2   Title  Pt will increase right grip strength by 15# and pinch strength by 6# to increase ability to carry bags or handheld items during ADLs.    Time  6    Period  Weeks    Status  On-going  OT LONG TERM GOAL #3   Title  Pt will improve fine motor coordination by completing 9 hole peg test in under 26" to improve skills required for fixing hair.    Time  6    Period  Weeks     Status  On-going      OT LONG TERM GOAL #4   Title  Pt will decrease pain in RUE to 3/10 or less to improve ability to sleep at night.    Time  6    Period  Weeks    Status  On-going            Plan - 07/18/19 0939    Clinical Impression Statement  A: Pt reports increased pain in right forearm/epicondyle region with increased fascial restrictions noted. Manual techniques completed to address fascial restrictions. VC for form and technique were provided during session. Focused on gentle grip and pinch strengthening to pain in RUE.    Body Structure / Function / Physical Skills  ADL;UE functional use;Pain;FMC;GMC;Sensation;IADL;Strength    Plan  P: Manual therapy to right forearm if needed. Continue with RUE strength. Complete pinch task.    Consulted and Agree with Plan of Care  Patient       Patient will benefit from skilled therapeutic intervention in order to improve the following deficits and impairments:   Body Structure / Function / Physical Skills: ADL, UE functional use, Pain, FMC, GMC, Sensation, IADL, Strength       Visit Diagnosis: Pain in right arm  Other lack of coordination  Other symptoms and signs involving the musculoskeletal system    Problem List Patient Active Problem List   Diagnosis Date Noted  . Raynaud's disease without gangrene 10/15/2018  . Angina pectoris ([facility]) 10/07/2018  . Essential hypertension 08/03/2018  . CAD (coronary artery disease), native coronary artery 08/03/2018  . Post PTCA 08/02/2018  . Elevated IgE level 07/19/2018  . Dyspnea on exertion 07/19/2018  . Long-term exposure involving bird droppings 07/19/2018  . Iliotibial band syndrome of left side 10/21/2011   Limmie PatriciaLaura , OTR/L,CBIS  [phone]  07/18/2019, 11:06 AM  [facility] [address] [place], [place], [address] Phone: [phone]   Fax:  [phone]  Name: Drucie Ipatricia A Aina MRN: [id number] Date of Birth:  12/28/1955

## 2019-07-18 NOTE — Telephone Encounter (Signed)
pt came into the office to cancel both appts due to she fell and she said she needs a day to recuperate.

## 2019-07-19 ENCOUNTER — Ambulatory Visit (HOSPITAL_COMMUNITY): Payer: BC Managed Care – PPO | Admitting: Physical Therapy

## 2019-07-19 ENCOUNTER — Encounter (HOSPITAL_COMMUNITY): Payer: BC Managed Care – PPO | Admitting: Speech Pathology

## 2019-07-20 ENCOUNTER — Other Ambulatory Visit: Payer: Self-pay

## 2019-07-20 ENCOUNTER — Ambulatory Visit (HOSPITAL_COMMUNITY): Payer: BC Managed Care – PPO

## 2019-07-20 ENCOUNTER — Encounter (HOSPITAL_COMMUNITY): Payer: Self-pay

## 2019-07-20 DIAGNOSIS — R278 Other lack of coordination: Secondary | ICD-10-CM

## 2019-07-20 DIAGNOSIS — M79601 Pain in right arm: Secondary | ICD-10-CM

## 2019-07-20 DIAGNOSIS — R29898 Other symptoms and signs involving the musculoskeletal system: Secondary | ICD-10-CM

## 2019-07-20 DIAGNOSIS — R41841 Cognitive communication deficit: Secondary | ICD-10-CM | POA: Diagnosis not present

## 2019-07-20 NOTE — Therapy (Signed)
[facility] [address] [place], [place], [address] Phone: [phone]   Fax:  [phone]  Occupational Therapy Treatment  Patient Details  Name: Tonya Boyd MRN: [id number] Date of Birth: 12/25/1955 Referring Provider (OT): Dr. Delia HeadyPramod Sethi   Encounter Date: 07/20/2019  OT End of Session - 07/20/19 1650    Visit Number  6    Number of Visits  12    Date for OT Re-Evaluation  08/09/19    Authorization Type  [employer] COMM PPO    OT Start Time  0945    OT Stop Time  1023    OT Time Calculation (min)  38 min    Activity Tolerance  Patient tolerated treatment well    Behavior During Therapy  [facility] for tasks assessed/performed       Past Medical History:  Diagnosis Date  . Angina pectoris (HCC)   . Asthma    "as a child and as an adult" (08/02/2018)  . CAD (coronary artery disease), native coronary artery 08/03/2018  . Chronic bronchitis (HCC)   . Chronic kidney disease   . Coronary artery disease   . Family history of adverse reaction to anesthesia    "cousin stopped breathing; he was allergic to the anesthesia" (08/02/2018)  . GERD (gastroesophageal reflux disease)   . High cholesterol   . History of gout   . History of hiatal hernia   . History of kidney stones   . Hypertension 07/2018  . Interstitial cystitis   . NSVT (nonsustained ventricular tachycardia) (HCC) 07/2018  . Raynaud's disease    "feet and hands" (08/02/2018)  . Raynaud's disease without gangrene 10/15/2018    Past Surgical History:  Procedure Laterality Date  . ABDOMINAL HYSTERECTOMY    . ANTERIOR CERVICAL DECOMP/DISCECTOMY FUSION    . APPENDECTOMY    . AUGMENTATION MAMMAPLASTY Bilateral   . BACK SURGERY    . CORONARY ANGIOPLASTY WITH STENT PLACEMENT  08/02/2018  . CORONARY STENT INTERVENTION N/A 08/02/2018   Procedure: CORONARY STENT INTERVENTION;  Surgeon: Yates DecampGanji, Jay, MD;  Location: [facility];  Service: Cardiovascular;  Laterality: N/A;   RCA   . CYSTOSCOPY W/ STONE MANIPULATION    . INTRAVASCULAR PRESSURE WIRE/FFR STUDY N/A 10/07/2018   Procedure: INTRAVASCULAR PRESSURE WIRE/FFR STUDY;  Surgeon: Yates DecampGanji, Jay, MD;  Location: [facility];  Service: Cardiovascular;  Laterality: N/A;  . KNEE ARTHROSCOPY Right   . LAPAROSCOPIC CHOLECYSTECTOMY    . LEFT HEART CATH AND CORONARY ANGIOGRAPHY N/A 10/07/2018   Procedure: LEFT HEART CATH AND CORONARY ANGIOGRAPHY;  Surgeon: Yates DecampGanji, Jay, MD;  Location: [facility];  Service: Cardiovascular;  Laterality: N/A;  . REPAIR ANKLE LIGAMENT Left    "tied up ligament; had tore up qthing in my ankle"  . RIGHT HEART CATH  10/07/2018  . RIGHT/LEFT HEART CATH AND CORONARY ANGIOGRAPHY N/A 08/02/2018   Procedure: RIGHT/LEFT HEART CATH AND CORONARY ANGIOGRAPHY;  Surgeon: Yates DecampGanji, Jay, MD;  Location: [facility];  Service: Cardiovascular;  Laterality: N/A;  . SHOULDER SURGERY     bilateral  . TONSILLECTOMY    . TUBAL LIGATION      There were no vitals filed for this visit.  Subjective Assessment - 07/20/19 0959    Subjective   S: My back feels a lot better.    Currently in Pain?  Yes   2/10 Back   Pain Score  5     Pain Location  Elbow  Pain Orientation  Right    Pain Descriptors / Indicators  Aching;Sore    Pain Type  Acute pain         [facility] OT Assessment - 07/20/19 1653      Assessment   Medical Diagnosis  stroke-like episode      Precautions   Precautions  None               OT Treatments/Exercises (OP) - 07/20/19 0959      Exercises   Exercises  Hand      Hand Exercises   Sponges  --    Other Hand Exercises  Used green resistive clothespin to pick up 30 sponges with a 3 point pinch    Other Hand Exercises  green eggsercizer; squeeze/release 10X      Neurological Re-education Exercises   Shoulder ABduction  Strengthening;15 reps   1#   Shoulder Protraction  Strengthening;15 reps   1#   Shoulder Horizontal ABduction  Strengthening;15 reps   1#    Shoulder External Rotation  Strengthening;15 reps   1#   Shoulder Internal Rotation  Strengthening;15 reps   1#   Elbow Flexion  Strengthening;15 reps   2# Hammer, prontated, supinated   Elbow Extension  Strengthening;15 reps   2# Hammer, prontated, supinated     Manual Therapy   Manual Therapy  Myofascial release    Manual therapy comments  Manual therapy completed prior to treatment activities.    Myofascial Release  Myofascial release completed to right epicondyle region of elbow and forearm to decrease fascial restrictions and decrease pain.                OT Short Term Goals - 07/06/19 1022      OT SHORT TERM GOAL #1   Title  Pt will be provided with and educated on HEP to improve RUE use as non-dominant during functional tasks.    Time  3    Period  Weeks    Status  On-going    Target Date  08/09/19      OT SHORT TERM GOAL #2   Title  Pt will improve right grip strength by 8# and pinch strength by 3# to improve ability to hold dishes when washing.    Time  3    Period  Weeks    Status  On-going        OT Long Term Goals - 07/06/19 1022      OT LONG TERM GOAL #1   Title  Pt will increase RUE strength to 4+/5 to improve ability to lift items into overhead cabinets safely using RUE as assist.    Time  6    Period  Weeks    Status  On-going      OT LONG TERM GOAL #2   Title  Pt will increase right grip strength by 15# and pinch strength by 6# to increase ability to carry bags or handheld items during ADLs.    Time  6    Period  Weeks    Status  On-going      OT LONG TERM GOAL #3   Title  Pt will improve fine motor coordination by completing 9 hole peg test in under 26" to improve skills required for fixing hair.    Time  6    Period  Weeks    Status  On-going      OT LONG TERM GOAL #4   Title  Pt will decrease pain  in RUE to 3/10 or less to improve ability to sleep at night.    Time  6    Period  Weeks    Status  On-going            Plan  - 07/20/19 1650    Clinical Impression Statement  A: Patient with less fascial restrictions in her right forearm with reports of lessened pain. Manual techniques completed to address fascial restrictions. VC for form and technique were provided. Patient required rest breaks during pinch strengthening tasks.    Body Structure / Function / Physical Skills  ADL;UE functional use;Pain;FMC;GMC;Sensation;IADL;Strength    Plan  P: Continue with manual therapy to the right forearm as needed. Grip strengthening    Consulted and Agree with Plan of Care  Patient       Patient will benefit from skilled therapeutic intervention in order to improve the following deficits and impairments:   Body Structure / Function / Physical Skills: ADL, UE functional use, Pain, FMC, GMC, Sensation, IADL, Strength       Visit Diagnosis: Other lack of coordination  Pain in right arm  Other symptoms and signs involving the musculoskeletal system    Problem List Patient Active Problem List   Diagnosis Date Noted  . Raynaud's disease without gangrene 10/15/2018  . Angina pectoris (HCC) 10/07/2018  . Essential hypertension 08/03/2018  . CAD (coronary artery disease), native coronary artery 08/03/2018  . Post PTCA 08/02/2018  . Elevated IgE level 07/19/2018  . Dyspnea on exertion 07/19/2018  . Long-term exposure involving bird droppings 07/19/2018  . Iliotibial band syndrome of left side 10/21/2011   Limmie Kwanza, OTR/L,CBIS  [phone]  07/20/2019, 4:53 PM  [facility] [address] [place], [place], [address] Phone: [phone]   Fax:  [phone]  Name: GAELYN TUKES MRN: [id number] Date of Birth: 1956/01/26

## 2019-07-24 ENCOUNTER — Ambulatory Visit (HOSPITAL_COMMUNITY): Payer: BC Managed Care – PPO | Admitting: Speech Pathology

## 2019-07-24 ENCOUNTER — Encounter (HOSPITAL_COMMUNITY): Payer: Self-pay | Admitting: Speech Pathology

## 2019-07-24 ENCOUNTER — Ambulatory Visit (HOSPITAL_COMMUNITY): Payer: BC Managed Care – PPO | Admitting: Physical Therapy

## 2019-07-24 ENCOUNTER — Encounter (HOSPITAL_COMMUNITY): Payer: Self-pay | Admitting: Physical Therapy

## 2019-07-24 ENCOUNTER — Other Ambulatory Visit: Payer: Self-pay

## 2019-07-24 DIAGNOSIS — R2689 Other abnormalities of gait and mobility: Secondary | ICD-10-CM

## 2019-07-24 DIAGNOSIS — R41841 Cognitive communication deficit: Secondary | ICD-10-CM | POA: Diagnosis not present

## 2019-07-24 DIAGNOSIS — M6281 Muscle weakness (generalized): Secondary | ICD-10-CM

## 2019-07-24 NOTE — Therapy (Signed)
[place], [place], [address] Phone: [phone]   Fax:  [phone]  Speech Language Pathology Treatment  Patient Details  Name: Tonya Boyd MRN: [id number] Date of Birth: 1955/09/19 Referring Provider (SLP): Fernande Bras, MD   Encounter Date: 07/24/2019  End of Session - 07/24/19 1003    Visit Number  8    Number of Visits  9    Date for SLP Re-Evaluation  07/27/19    Authorization Type  BCBS PPO   no deductible, OOP $[id number] met   SLP Start Time  [phone]    SLP Stop Time   1030    SLP Time Calculation (min)  38 min    Activity Tolerance  Patient tolerated treatment well       Past Medical History:  Diagnosis Date  . Angina pectoris ([place])   . Asthma    "as a child and as an adult" (08/02/2018)  . CAD (coronary artery disease), native coronary artery 08/03/2018  . Chronic bronchitis ([place])   . Chronic kidney disease   . Coronary artery disease   . Family history of adverse reaction to anesthesia    "cousin stopped breathing; he was allergic to the anesthesia" (08/02/2018)  . GERD (gastroesophageal reflux disease)   . High cholesterol   . History of gout   . History of hiatal hernia   . History of kidney stones   . Hypertension 07/2018  . Interstitial cystitis   . NSVT (nonsustained ventricular tachycardia) ([place]) 07/2018  . Raynaud's disease    "feet and hands" (08/02/2018)  . Raynaud's disease without gangrene 10/15/2018    Past Surgical History:  Procedure Laterality Date  . ABDOMINAL HYSTERECTOMY    . ANTERIOR CERVICAL DECOMP/DISCECTOMY FUSION    . APPENDECTOMY    . AUGMENTATION MAMMAPLASTY Bilateral   . BACK SURGERY    . CORONARY ANGIOPLASTY WITH STENT PLACEMENT  08/02/2018  . CORONARY STENT INTERVENTION N/A 08/02/2018   Procedure: CORONARY STENT INTERVENTION;  Surgeon: Adrian Prows, MD;  Location: [place] [facility];  Service: Cardiovascular;  Laterality: N/A;  RCA   . CYSTOSCOPY W/ STONE  MANIPULATION    . INTRAVASCULAR PRESSURE WIRE/FFR STUDY N/A 10/07/2018   Procedure: INTRAVASCULAR PRESSURE WIRE/FFR STUDY;  Surgeon: Adrian Prows, MD;  Location: [place] [facility];  Service: Cardiovascular;  Laterality: N/A;  . KNEE ARTHROSCOPY Right   . LAPAROSCOPIC CHOLECYSTECTOMY    . LEFT HEART CATH AND CORONARY ANGIOGRAPHY N/A 10/07/2018   Procedure: LEFT HEART CATH AND CORONARY ANGIOGRAPHY;  Surgeon: Adrian Prows, MD;  Location: [facility];  Service: Cardiovascular;  Laterality: N/A;  . REPAIR ANKLE LIGAMENT Left    "tied up ligament; had tore up qthing in my ankle"  . RIGHT HEART CATH  10/07/2018  . RIGHT/LEFT HEART CATH AND CORONARY ANGIOGRAPHY N/A 08/02/2018   Procedure: RIGHT/LEFT HEART CATH AND CORONARY ANGIOGRAPHY;  Surgeon: Adrian Prows, MD;  Location: [facility];  Service: Cardiovascular;  Laterality: N/A;  . SHOULDER SURGERY     bilateral  . TONSILLECTOMY    . TUBAL LIGATION      There were no vitals filed for this visit.  Subjective Assessment - 07/24/19 1000    Subjective  "I not know where it is."    Currently in Pain?  No/denies       ADULT SLP TREATMENT - 07/24/19 1001      General Information   Behavior/Cognition  Alert;Cooperative;Pleasant mood  Patient Positioning  Upright in chair    Oral care provided  N/A    HPI  56 year patient with speech difficulty, right sided weakness and numbness from stroke like episode of unclear etiology. MRI brain x 2 negative for stroke. 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 Exam shows nonorganic features.No obvious underlying psychosocial stressors identified.       Treatment Provided   Treatment provided  Cognitive-Linquistic      Pain Assessment   Pain Assessment  No/denies pain      Cognitive-Linquistic Treatment   Treatment focused on  Aphasia;Apraxia;Patient/family/caregiver education    Skilled Treatment  SLP provided skilled treatment targeting moderately complex word  finding via synonym and antonym tasks. Pt found the task challenging with homework, however accuracy increased in session with min SLP cues (encouraged her to use in a sentence to help establish a synonym or antonym). Pt with increased grammatical errors in conversation today and needed error awareness cues to self correct.      Assessment / Recommendations / Plan   Plan  Continue with current plan of care      Progression Toward Goals   Progression toward goals  Progressing toward goals         SLP Short Term Goals - 07/24/19 1013      SLP SHORT TERM GOAL #1   Title  Pt will produce s-blends in the initial position of words with 90% acc when labeling pictures with min cues for error awareness.    Baseline  25%    Time  4    Period  Weeks    Status  On-going    Target Date  07/27/19      SLP SHORT TERM GOAL #2   Title  Pt will imitate 7+ word sentences with 95% acc with cue for error awareness for proper grammar/syntax and allowance for articulatory errors.    Baseline  Pt substitutes me/I, is/be, etc; 75%    Time  4    Period  Weeks    Status  On-going    Target Date  07/27/19      SLP SHORT TERM GOAL #3   Title  Pt will describe pictures by generating sentences of 5+ words using proper grammar and syntax with 90% acc and min cues.    Baseline  70% acc    Time  4    Period  Weeks    Status  On-going    Target Date  07/27/19      SLP SHORT TERM GOAL #4   Title  Pt will complete functional working memory activities with 90% acc and use of memory strategies when provided mi/mod cues.    Baseline  25% no assist    Time  4    Period  Weeks    Status  On-going    Target Date  07/27/19      SLP SHORT TERM GOAL #5   Title  Continue ongoing assessment of speech changes related to grammar, syntax, and articulatory errors over the next few sessions.    Time  4    Period  Weeks    Status  On-going    Target Date  07/27/19       SLP Long Term Goals - 07/24/19 1014       SLP LONG TERM GOAL #1   Title  Same as short term goals       Plan - 07/24/19 1013    Clinical Impression  Statement Pt continues to make good progress toward goals, however she needs reminders to decrease rate in order to avoid grammatical and syntax errors. She started an audiobook on her phone and was able to provide a short verbal summary. She did not recall the paragraph we read together last week from the [employer]. Next session, will target reading comprehension and facilitate verbal discussion with written cues.    Speech Therapy Frequency  2x / week    Duration  4 weeks    Treatment/Interventions  Cueing hierarchy;SLP instruction and feedback;Compensatory techniques;Compensatory strategies;Functional tasks;Patient/family education;Multimodal communcation approach    Potential to Achieve Goals  Good    SLP Home Exercise Plan  Pt will complete HEP as assigned to facilitate carryover of treatment strategies and techniques in home environment independently.    Consulted and Agree with Plan of Care  Patient       Patient will benefit from skilled therapeutic intervention in order to improve the following deficits and impairments:   Cognitive communication deficit    Problem List Patient Active Problem List   Diagnosis Date Noted  . Raynaud's disease without gangrene 10/15/2018  . Angina pectoris ([place]) 10/07/2018  . Essential hypertension 08/03/2018  . CAD (coronary artery disease), native coronary artery 08/03/2018  . Post PTCA 08/02/2018  . Elevated IgE level 07/19/2018  . Dyspnea on exertion 07/19/2018  . Long-term exposure involving bird droppings 07/19/2018  . Iliotibial band syndrome of left side 10/21/2011   Thank you,  Genene Churn, [place]  [place] 07/24/2019, 10:14 AM  [place] [address] [place], [place], [address] Phone: [phone]   Fax:  [phone]   Name: RICHIE VADALA MRN: [id number] Date of Birth: 1956/04/15

## 2019-07-24 NOTE — Therapy (Signed)
[place], [place], [address] Phone: [phone]   Fax:  [phone]  Physical Therapy Treatment  Patient Details  Name: Tonya Boyd MRN: [id number] Date of Birth: 06/21/1956 Referring Provider (PT): Garvin Fila, MD   Encounter Date: 07/24/2019  PT End of Session - 07/24/19 1043    Visit Number  7    Number of Visits  12    Date for PT Re-Evaluation  08/09/19    Authorization Type  BCBS MET dedcutible and OOPvisits are combined with PT/OT/Chiro - 30 total per benefit period    Authorization Time Period  06/28/19-08/09/19    Authorization - Visit Number  7    Authorization - Number of Visits  12    PT Start Time  1941   patient late to appointmet   PT Stop Time  1113    PT Time Calculation (min)  38 min    Equipment Utilized During Treatment  Gait belt    Activity Tolerance  Patient tolerated treatment well       Past Medical History:  Diagnosis Date  . Angina pectoris ([facility])   . Asthma    "as a child and as an adult" (08/02/2018)  . CAD (coronary artery disease), native coronary artery 08/03/2018  . Chronic bronchitis ([facility])   . Chronic kidney disease   . Coronary artery disease   . Family history of adverse reaction to anesthesia    "cousin stopped breathing; he was allergic to the anesthesia" (08/02/2018)  . GERD (gastroesophageal reflux disease)   . High cholesterol   . History of gout   . History of hiatal hernia   . History of kidney stones   . Hypertension 07/2018  . Interstitial cystitis   . NSVT (nonsustained ventricular tachycardia) ([facility]) 07/2018  . Raynaud's disease    "feet and hands" (08/02/2018)  . Raynaud's disease without gangrene 10/15/2018    Past Surgical History:  Procedure Laterality Date  . ABDOMINAL HYSTERECTOMY    . ANTERIOR CERVICAL DECOMP/DISCECTOMY FUSION    . APPENDECTOMY    . AUGMENTATION MAMMAPLASTY Bilateral   . BACK SURGERY    . CORONARY ANGIOPLASTY WITH  STENT PLACEMENT  08/02/2018  . CORONARY STENT INTERVENTION N/A 08/02/2018   Procedure: CORONARY STENT INTERVENTION;  Surgeon: Adrian Prows, MD;  Location: [facility];  Service: Cardiovascular;  Laterality: N/A;  RCA   . CYSTOSCOPY W/ STONE MANIPULATION    . INTRAVASCULAR PRESSURE WIRE/FFR STUDY N/A 10/07/2018   Procedure: INTRAVASCULAR PRESSURE WIRE/FFR STUDY;  Surgeon: Adrian Prows, MD;  Location: [facility];  Service: Cardiovascular;  Laterality: N/A;  . KNEE ARTHROSCOPY Right   . LAPAROSCOPIC CHOLECYSTECTOMY    . LEFT HEART CATH AND CORONARY ANGIOGRAPHY N/A 10/07/2018   Procedure: LEFT HEART CATH AND CORONARY ANGIOGRAPHY;  Surgeon: Adrian Prows, MD;  Location: [facility];  Service: Cardiovascular;  Laterality: N/A;  . REPAIR ANKLE LIGAMENT Left    "tied up ligament; had tore up qthing in my ankle"  . RIGHT HEART CATH  10/07/2018  . RIGHT/LEFT HEART CATH AND CORONARY ANGIOGRAPHY N/A 08/02/2018   Procedure: RIGHT/LEFT HEART CATH AND CORONARY ANGIOGRAPHY;  Surgeon: Adrian Prows, MD;  Location: [facility];  Service: Cardiovascular;  Laterality: N/A;  . SHOULDER SURGERY     bilateral  . TONSILLECTOMY    . TUBAL LIGATION      There were no vitals filed for this visit.  Subjective Assessment -  07/24/19 1035    Subjective  Patient says she fell last week when trying to sit on a swing. Patient says she missed the swing and landed on her bottom. Patient denies injury, but says she was sore the next day which is why she did not make last therapy appointment. Patient says she found some stretches to do for her back online, which helped her feel better. Patient says she feels pretty good today, and denies current pain, but notes that her RT knee was sore from increased walking yesterday. Patient says she iced her knee last night, and today it is not bothering her.    Pertinent History  Cardiac stent 2019, HTN, Angina    Limitations  Standing;Walking;House hold activities;Other  (comment)    How long can you stand comfortably?  10 minutes    How long can you walk comfortably?  Not sure    Diagnostic tests  MRI    Patient Stated Goals  "Have less pain, walk better"    Currently in Pain?  No/denies    Pain Onset  1 to 4 weeks ago    Pain Onset  1 to 4 weeks ago                       [facility] Adult PT Treatment/Exercise - 07/24/19 0001      Knee/Hip Exercises: Standing   Heel Raises  20 reps;Both    Heel Raises Limitations  Toe raise on slope x20    Hip Abduction  Both;20 reps    Hip Extension  Both;20 reps    Lateral Step Up  Both;15 reps;Hand Hold: 2;Step Height: 6"    Forward Step Up  Both;15 reps;Hand Hold: 2;Step Height: 6"    SLS  --      Knee/Hip Exercises: Seated   Sit to Sand  1 set;10 reps;without UE support          Balance Exercises - 07/24/19 1100      Balance Exercises: Standing   Tandem Stance  3 reps;30 secs;Eyes open;Foam/compliant surface;Intermittent upper extremity support    SLS  Eyes open;15 secs;Intermittent upper extremity support;Solid surface;Other (comment);3 reps    Tandem Gait  Forward;3 reps   15', no AD; 3 x RT   Sidestepping  3 reps   15', 3 x RT; no AD    Turning  --   57' 2 xRT; with headturns; no AD          PT Short Term Goals - 06/28/19 1539      PT SHORT TERM GOAL #1   Title  Patient will be IND with initial HEP to improve funcitonal outcomes    Time  3    Period  Weeks    Status  New    Target Date  07/19/19      PT SHORT TERM GOAL #2   Title  Patient will have pain in LT knee no greater than 3/10 on average to improve funcitonal mobility    Time  3    Period  Weeks    Status  New    Target Date  07/19/19      PT SHORT TERM GOAL #3   Title  Patient will improve RLE MMT by 1/2 grade to reduce pain and improve functional mobility    Time  3    Period  Weeks    Status  New    Target Date  07/19/19  PT Long Term Goals - 06/28/19 1542      PT LONG TERM GOAL #1   Title   Patient will improve RLE MMT to greater than or equal to 4/5 throuhout to improve funcional mobility and standing tolerance    Time  6    Period  Weeks    Status  New    Target Date  08/09/19      PT LONG TERM GOAL #2   Title  Patient will be able to perform SLS > or equal to 10 seconds on BLE to improve funcitonal mobility and reduce risk of falls    Time  6    Period  Weeks    Status  New    Target Date  08/09/19      PT LONG TERM GOAL #3   Title  Patient will be able to walk >30 minutes IND with no increase in pain level to improve funcitonal mobility and ADLs    Time  6    Period  Weeks    Status  New    Target Date  08/09/19            Plan - 07/24/19 1114    Clinical Impression Statement  Patient had some increased pain in RT knee with lateral step ups on 6 inch box. Patient required several breaks for rest due to increased pain in Rt knee/ fatigue in BLEs. Patient appears to be near baseline funciton from last visit despite fall with no reported or apparent musculoskeltal injury last week. Patient was challenegd with dyanamci balance activity today, and had difficulty approximating blue line for tandem gait. Patient required min gaurd for maintining balance and was able to stay on blue line about 50% of the time. Patient also had some difficulty with ambulaiton with head turns. Patient showed decreased RLE swing through and decreased RT foot clearance with fatigue. Patient also showed some decrease in gait speed with head turning tasks, and had some difficulty following directional head trun command once fatigued.    Personal Factors and Comorbidities  Comorbidity 3+    Comorbidities  HTN, cardiac stent, angina    Examination-Activity Limitations  Locomotion Level;Lift;Transfers;Stand;Stairs;Squat    Examination-Participation Restrictions  Cleaning;Yard Work;Laundry;Community Activity    Stability/Clinical Decision Making  Evolving/Moderate complexity    Rehab Potential   Good    PT Frequency  2x / week    PT Duration  6 weeks    PT Treatment/Interventions  ADLs/Self Care Home Management;Aquatic Therapy;Biofeedback;Cryotherapy;Electrical Stimulation;Moist Heat;Ultrasound;DME Instruction;Gait training;Stair training;Functional mobility training;Therapeutic activities;Therapeutic exercise;Balance training;Manual techniques;Orthotic Fit/Training;Patient/family education;Neuromuscular re-education;Passive range of motion;Joint Manipulations;Taping;Vasopneumatic Device    PT Next Visit Plan  Progress dynamic balance activity. Monitor RT knee pain.    PT Home Exercise Plan  10/23.3/20:  Supine bridge and SLR, sidelying clamshell.       Patient will benefit from skilled therapeutic intervention in order to improve the following deficits and impairments:  Abnormal gait, Decreased endurance, Impaired sensation, Decreased activity tolerance, Decreased strength, Pain, Difficulty walking, Decreased mobility, Decreased balance, Decreased coordination, Improper body mechanics  Visit Diagnosis: Muscle weakness (generalized)  Other abnormalities of gait and mobility     Problem List Patient Active Problem List   Diagnosis Date Noted  . Raynaud's disease without gangrene 10/15/2018  . Angina pectoris ([facility]) 10/07/2018  . Essential hypertension 08/03/2018  . CAD (coronary artery disease), native coronary artery 08/03/2018  . Post PTCA 08/02/2018  . Elevated IgE level 07/19/2018  . Dyspnea on exertion 07/19/2018  .  Long-term exposure involving bird droppings 07/19/2018  . Iliotibial band syndrome of left side 10/21/2011    11:23 AM, 07/24/19 Josue Hector PT DPT  Physical Therapist with [facility]  [phone] [place] [address] [place], [place], [address] Phone: [phone]   Fax:  [phone]  Name: Tonya Boyd MRN: [id number] Date of Birth: 09/18/1955

## 2019-07-25 ENCOUNTER — Ambulatory Visit (HOSPITAL_COMMUNITY): Payer: BC Managed Care – PPO | Admitting: Occupational Therapy

## 2019-07-25 ENCOUNTER — Encounter (HOSPITAL_COMMUNITY): Payer: Self-pay | Admitting: Occupational Therapy

## 2019-07-25 DIAGNOSIS — R278 Other lack of coordination: Secondary | ICD-10-CM

## 2019-07-25 DIAGNOSIS — R29898 Other symptoms and signs involving the musculoskeletal system: Secondary | ICD-10-CM

## 2019-07-25 DIAGNOSIS — R41841 Cognitive communication deficit: Secondary | ICD-10-CM | POA: Diagnosis not present

## 2019-07-25 NOTE — Therapy (Signed)
[facility] [address] [place], [place], [address] Phone: [phone]   Fax:  [phone]  Occupational Therapy Treatment  Patient Details  Name: Tonya Boyd MRN: [id number] Date of Birth: 04/26/1956 Referring Provider (OT): Dr. Delia HeadyPramod Sethi   Encounter Date: 07/25/2019  OT End of Session - 07/25/19 1557    Visit Number  7    Number of Visits  12    Date for OT Re-Evaluation  08/09/19    Authorization Type  BCBS COMM PPO    OT Start Time  1515    OT Stop Time  1558    OT Time Calculation (min)  43 min    Activity Tolerance  Patient tolerated treatment well    Behavior During Therapy  [facility] for tasks assessed/performed       Past Medical History:  Diagnosis Date  . Angina pectoris ([facility])   . Asthma    "as a child and as an adult" (08/02/2018)  . CAD (coronary artery disease), native coronary artery 08/03/2018  . Chronic bronchitis ([facility])   . Chronic kidney disease   . Coronary artery disease   . Family history of adverse reaction to anesthesia    "cousin stopped breathing; he was allergic to the anesthesia" (08/02/2018)  . GERD (gastroesophageal reflux disease)   . High cholesterol   . History of gout   . History of hiatal hernia   . History of kidney stones   . Hypertension 07/2018  . Interstitial cystitis   . NSVT (nonsustained ventricular tachycardia) ([facility]) 07/2018  . Raynaud's disease    "feet and hands" (08/02/2018)  . Raynaud's disease without gangrene 10/15/2018    Past Surgical History:  Procedure Laterality Date  . ABDOMINAL HYSTERECTOMY    . ANTERIOR CERVICAL DECOMP/DISCECTOMY FUSION    . APPENDECTOMY    . AUGMENTATION MAMMAPLASTY Bilateral   . BACK SURGERY    . CORONARY ANGIOPLASTY WITH STENT PLACEMENT  08/02/2018  . CORONARY STENT INTERVENTION N/A 08/02/2018   Procedure: CORONARY STENT INTERVENTION;  Surgeon: Yates DecampGanji, Jay, MD;  Location[facility];  Service: Cardiovascular;  Laterality: N/A;   RCA   . CYSTOSCOPY W/ STONE MANIPULATION    . INTRAVASCULAR PRESSURE WIRE/FFR STUDY N/A 10/07/2018   Procedure: INTRAVASCULAR PRESSURE WIRE/FFR STUDY;  Surgeon: Yates DecampGanji, Jay, MD;  Location: [facility];  Service: Cardiovascular;  Laterality: N/A;  . KNEE ARTHROSCOPY Right   . LAPAROSCOPIC CHOLECYSTECTOMY    . LEFT HEART CATH AND CORONARY ANGIOGRAPHY N/A 10/07/2018   Procedure: LEFT HEART CATH AND CORONARY ANGIOGRAPHY;  Surgeon: Yates DecampGanji, Jay, MD;  Location: [facility];  Service: Cardiovascular;  Laterality: N/A;  . REPAIR ANKLE LIGAMENT Left    "tied up ligament; had tore up qthing in my ankle"  . RIGHT HEART CATH  10/07/2018  . RIGHT/LEFT HEART CATH AND CORONARY ANGIOGRAPHY N/A 08/02/2018   Procedure: RIGHT/LEFT HEART CATH AND CORONARY ANGIOGRAPHY;  Surgeon: Yates DecampGanji, Jay, MD;  Location: [facility];  Service: Cardiovascular;  Laterality: N/A;  . SHOULDER SURGERY     bilateral  . TONSILLECTOMY    . TUBAL LIGATION      There were no vitals filed for this visit.  Subjective Assessment - 07/25/19 1514    Subjective   S: I couldn't find a compression sleeve so I cut a compression sock and it feels better.    Currently in Pain?  Yes    Pain Score  3  Pain Location  Elbow    Pain Orientation  Right    Pain Descriptors / Indicators  Sore;Aching    Pain Type  Acute pain    Pain Radiating Towards  N/A    Pain Onset  1 to 4 weeks ago    Pain Frequency  Constant    Aggravating Factors   unsure    Pain Relieving Factors  compression sleeve on elbow    Effect of Pain on Daily Activities  mod effect    Multiple Pain Sites  No         [facility] OT Assessment - 07/25/19 1514      Assessment   Medical Diagnosis  stroke-like episode      Precautions   Precautions  None               OT Treatments/Exercises (OP) - 07/25/19 1519      Exercises   Exercises  Hand      Hand Exercises   Hand Gripper with Large Beads  all beads gripper at 42#    Hand Gripper with  Medium Beads  all beads gripper at 35#    Hand Gripper with Small Beads  all beads gripper at 25#    Other Hand Exercises  Pt completing table building activity working on fine motor skills and cognitive sequencing. Pt with min difficulty putting table together and tightening nuts. Requiring step by step sequencing for following directions, even with visual instructions to reference.       Manual Therapy   Manual Therapy  Myofascial release    Manual therapy comments  Manual therapy completed prior to treatment activities.    Myofascial Release  Myofascial release completed to right epicondyle region of elbow and forearm to decrease fascial restrictions and decrease pain.                OT Short Term Goals - 07/06/19 1022      OT SHORT TERM GOAL #1   Title  Pt will be provided with and educated on HEP to improve RUE use as non-dominant during functional tasks.    Time  3    Period  Weeks    Status  On-going    Target Date  08/09/19      OT SHORT TERM GOAL #2   Title  Pt will improve right grip strength by 8# and pinch strength by 3# to improve ability to hold dishes when washing.    Time  3    Period  Weeks    Status  On-going        OT Long Term Goals - 07/06/19 1022      OT LONG TERM GOAL #1   Title  Pt will increase RUE strength to 4+/5 to improve ability to lift items into overhead cabinets safely using RUE as assist.    Time  6    Period  Weeks    Status  On-going      OT LONG TERM GOAL #2   Title  Pt will increase right grip strength by 15# and pinch strength by 6# to increase ability to carry bags or handheld items during ADLs.    Time  6    Period  Weeks    Status  On-going      OT LONG TERM GOAL #3   Title  Pt will improve fine motor coordination by completing 9 hole peg test in under 26" to improve skills required for fixing hair.  Time  6    Period  Weeks    Status  On-going      OT LONG TERM GOAL #4   Title  Pt will decrease pain in RUE to 3/10  or less to improve ability to sleep at night.    Time  6    Period  Weeks    Status  On-going            Plan - 07/25/19 1555    Clinical Impression Statement  A: Pt reporting she is using her hands more at home and it feels natural to her now. Continued with manual techniques on right elbow at beginning of session. Resumed grip strengthening and added coordination task with table assembly activity. Pt requiring mod assist for following directions for table assembly. Verbal cuing for form and technique during session.    Body Structure / Function / Physical Skills  ADL;UE functional use;Pain;FMC;GMC;Sensation;IADL;Strength    Plan  P: Resume RUE strengthening, manual therapy as needed, pinch strengthening       Patient will benefit from skilled therapeutic intervention in order to improve the following deficits and impairments:   Body Structure / Function / Physical Skills: ADL, UE functional use, Pain, FMC, GMC, Sensation, IADL, Strength       Visit Diagnosis: Other lack of coordination  Other symptoms and signs involving the musculoskeletal system    Problem List Patient Active Problem List   Diagnosis Date Noted  . Raynaud's disease without gangrene 10/15/2018  . Angina pectoris ([facility]) 10/07/2018  . Essential hypertension 08/03/2018  . CAD (coronary artery disease), native coronary artery 08/03/2018  . Post PTCA 08/02/2018  . Elevated IgE level 07/19/2018  . Dyspnea on exertion 07/19/2018  . Long-term exposure involving bird droppings 07/19/2018  . Iliotibial band syndrome of left side 10/21/2011   Tonya Boyd, OTR/L  [phone] 07/25/2019, 3:58 PM  [facility] [address] [place], [place], [address] Phone: [phone]   Fax:  [phone]  Name: Tonya Boyd MRN: [id number] Date of Birth: November 17, 1955

## 2019-07-26 ENCOUNTER — Ambulatory Visit (HOSPITAL_COMMUNITY): Payer: BC Managed Care – PPO | Admitting: Speech Pathology

## 2019-07-26 ENCOUNTER — Encounter (HOSPITAL_COMMUNITY): Payer: Self-pay | Admitting: Speech Pathology

## 2019-07-26 ENCOUNTER — Other Ambulatory Visit: Payer: Self-pay

## 2019-07-26 ENCOUNTER — Telehealth (HOSPITAL_COMMUNITY): Payer: Self-pay | Admitting: Physical Therapy

## 2019-07-26 ENCOUNTER — Ambulatory Visit (HOSPITAL_COMMUNITY): Payer: BC Managed Care – PPO | Admitting: Physical Therapy

## 2019-07-26 ENCOUNTER — Encounter (HOSPITAL_COMMUNITY): Payer: Self-pay | Admitting: Physical Therapy

## 2019-07-26 ENCOUNTER — Ambulatory Visit: Payer: Self-pay | Admitting: Neurology

## 2019-07-26 DIAGNOSIS — M6281 Muscle weakness (generalized): Secondary | ICD-10-CM

## 2019-07-26 DIAGNOSIS — R41841 Cognitive communication deficit: Secondary | ICD-10-CM

## 2019-07-26 DIAGNOSIS — R2689 Other abnormalities of gait and mobility: Secondary | ICD-10-CM

## 2019-07-26 NOTE — Therapy (Signed)
[place], [place], [address] Phone: [phone]   Fax:  [phone]  Speech Language Pathology Treatment  Patient Details  Name: Tonya Boyd MRN: [id number] Date of Birth: 08/29/1956 Referring Provider (SLP): Fernande Bras, MD   Encounter Date: 07/26/2019  End of Session - 07/26/19 1010    Visit Number  9    Number of Visits  11    Date for SLP Re-Evaluation  08/02/19    Authorization Type  BCBS PPO   no deductible, OOP $[id number] met   SLP Start Time  0945    SLP Stop Time   1030    SLP Time Calculation (min)  45 min    Activity Tolerance  Patient tolerated treatment well       Past Medical History:  Diagnosis Date  . Angina pectoris ([place])   . Asthma    "as a child and as an adult" (08/02/2018)  . CAD (coronary artery disease), native coronary artery 08/03/2018  . Chronic bronchitis ([place])   . Chronic kidney disease   . Coronary artery disease   . Family history of adverse reaction to anesthesia    "cousin stopped breathing; he was allergic to the anesthesia" (08/02/2018)  . GERD (gastroesophageal reflux disease)   . High cholesterol   . History of gout   . History of hiatal hernia   . History of kidney stones   . Hypertension 07/2018  . Interstitial cystitis   . NSVT (nonsustained ventricular tachycardia) ([place]) 07/2018  . Raynaud's disease    "feet and hands" (08/02/2018)  . Raynaud's disease without gangrene 10/15/2018    Past Surgical History:  Procedure Laterality Date  . ABDOMINAL HYSTERECTOMY    . ANTERIOR CERVICAL DECOMP/DISCECTOMY FUSION    . APPENDECTOMY    . AUGMENTATION MAMMAPLASTY Bilateral   . BACK SURGERY    . CORONARY ANGIOPLASTY WITH STENT PLACEMENT  08/02/2018  . CORONARY STENT INTERVENTION N/A 08/02/2018   Procedure: CORONARY STENT INTERVENTION;  Surgeon: Adrian Prows, MD;  Location: [facility];  Service: Cardiovascular;  Laterality: N/A;  RCA   . CYSTOSCOPY W/ STONE  MANIPULATION    . INTRAVASCULAR PRESSURE WIRE/FFR STUDY N/A 10/07/2018   Procedure: INTRAVASCULAR PRESSURE WIRE/FFR STUDY;  Surgeon: Adrian Prows, MD;  Location: [facility];  Service: Cardiovascular;  Laterality: N/A;  . KNEE ARTHROSCOPY Right   . LAPAROSCOPIC CHOLECYSTECTOMY    . LEFT HEART CATH AND CORONARY ANGIOGRAPHY N/A 10/07/2018   Procedure: LEFT HEART CATH AND CORONARY ANGIOGRAPHY;  Surgeon: Adrian Prows, MD;  Location: [facility];  Service: Cardiovascular;  Laterality: N/A;  . REPAIR ANKLE LIGAMENT Left    "tied up ligament; had tore up qthing in my ankle"  . RIGHT HEART CATH  10/07/2018  . RIGHT/LEFT HEART CATH AND CORONARY ANGIOGRAPHY N/A 08/02/2018   Procedure: RIGHT/LEFT HEART CATH AND CORONARY ANGIOGRAPHY;  Surgeon: Adrian Prows, MD;  Location: [facility];  Service: Cardiovascular;  Laterality: N/A;  . SHOULDER SURGERY     bilateral  . TONSILLECTOMY    . TUBAL LIGATION      There were no vitals filed for this visit.  Subjective Assessment - 07/26/19 0956    Subjective  "Reading is difficult."    Currently in Pain?  No/denies         ADULT SLP TREATMENT - 07/26/19 0957      General Information   Behavior/Cognition  Alert;Cooperative;Pleasant mood    Patient  Positioning  Upright in chair    Oral care provided  N/A    HPI  62 year patient with speech difficulty, right sided weakness and numbness from stroke like episode of unclear etiology. MRI brain x 2 negative for stroke. 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 Exam shows nonorganic features.No obvious underlying psychosocial stressors identified.       Treatment Provided   Treatment provided  Cognitive-Linquistic      Pain Assessment   Pain Assessment  No/denies pain      Cognitive-Linquistic Treatment   Treatment focused on  Aphasia;Apraxia;Patient/family/caregiver education    Skilled Treatment  SLP provided skilled treatment targeting moderately complex word  finding via synonym and antonym tasks. Pt found the task challenging with homework, however accuracy increased in session with min SLP cues (encouraged her to use in a sentence to help establish a synonym or antonym). Pt with increased grammatical errors in conversation today and needed error awareness cues to self correct.       Assessment / Recommendations / Plan   Plan  Continue with current plan of care      Progression Toward Goals   Progression toward goals  Goals met and updated       SLP Education - 07/26/19 1009    Education Details  ongoing HEP for reading comprehension and oral reading practice    Person(s) Educated  Patient    Methods  Explanation;Handout    Comprehension  Verbalized understanding       SLP Short Term Goals - 07/26/19 1020      SLP SHORT TERM GOAL #1   Title  Pt will produce s-blends in the initial position of words with 90% acc when labeling pictures with min cues for error awareness.    Baseline  25%    Time  4    Period  Weeks    Status  Achieved    Target Date  07/27/19      SLP SHORT TERM GOAL #2   Title  Pt will imitate 7+ word sentences with 95% acc with cue for error awareness for proper grammar/syntax and allowance for articulatory errors.    Baseline  Pt substitutes me/I, is/be, etc; 75%    Time  4    Period  Weeks    Status  Achieved    Target Date  07/27/19      SLP SHORT TERM GOAL #3   Title  Pt will describe pictures by generating sentences of 5+ words using proper grammar and syntax with 90% acc and min cues.    Baseline  70% acc    Time  4    Period  Weeks    Status  Achieved    Target Date  07/27/19      SLP SHORT TERM GOAL #4   Title  Pt will complete functional working memory activities with 90% acc and use of memory strategies when provided mi/mod cues.    Baseline  25% no assist    Time  4    Period  Weeks    Status  Achieved    Target Date  07/27/19      SLP SHORT TERM GOAL #5   Title Pt will self correct  paraphasic errors (also bound morphemes) in conversation and oral reading task 90% of the time with rare min cues for error awareness.   Time  1   Period  Week   Status  75%   Target Date  07/27/19      Additional Short Term Goals   Additional Short Term Goals  Yes      SLP SHORT TERM GOAL #6   Title  Pt will increase reading comprehension of short paragraphs with multiple choice and short answer responses to 95% acc with min cues.      SLP Long Term Goals - 07/26/19 1024      SLP LONG TERM GOAL #1   Title  Same as short term goals    Status  On-going       Plan - 07/26/19 1020    Clinical Impression Statement  Pt continues to make good progress toward goals, however she needs reminders to decrease rate in order to avoid grammatical and syntax errors. Pt will have two more sessions next week in preparation for discharge. A reading comprehension goal and oral reading goal has been added.   Speech Therapy Frequency  2x / week    Duration  1 week    Treatment/Interventions  Cueing hierarchy;SLP instruction and feedback;Compensatory techniques;Compensatory strategies;Functional tasks;Patient/family education;Multimodal communcation approach    Potential to Achieve Goals  Good    SLP Home Exercise Plan  Pt will complete HEP as assigned to facilitate carryover of treatment strategies and techniques in home environment independently.    Consulted and Agree with Plan of Care  Patient       Patient will benefit from skilled therapeutic intervention in order to improve the following deficits and impairments:   Cognitive communication deficit    Problem List Patient Active Problem List   Diagnosis Date Noted  . Raynaud's disease without gangrene 10/15/2018  . Angina pectoris ([facility]) 10/07/2018  . Essential hypertension 08/03/2018  . CAD (coronary artery disease), native coronary artery 08/03/2018  . Post PTCA 08/02/2018  . Elevated IgE level 07/19/2018  . Dyspnea on exertion  07/19/2018  . Long-term exposure involving bird droppings 07/19/2018  . Iliotibial band syndrome of left side 10/21/2011   Thank you,  Genene Churn, [place]  [facility] 07/26/2019, 10:25 AM  [place], [place], [address] Phone: [phone]   Fax:  [phone]   Name: Tonya Boyd MRN: [id number] Date of Birth: [birth year]

## 2019-07-26 NOTE — Telephone Encounter (Signed)
Pt will request to be D/c after 08/07/2019 b/c of insurance change and she can not afford our services.

## 2019-07-26 NOTE — Therapy (Signed)
[place], [place], [address] Phone: [phone]   Fax:  [phone]  Physical Therapy Treatment/ Reassessment   Patient Details  Name: Tonya Boyd MRN: [id number] Date of Birth: July 01, 1956 Referring Provider (PT): Garvin Fila, MD   Encounter Date: 07/26/2019   Progress Note Reporting Period 06/28/19 to 07/26/19  See note below for Objective Data and Assessment of Progress/Goals.     PT End of Session - 07/26/19 1037    Visit Number  8    Number of Visits  12    Date for PT Re-Evaluation  08/09/19    Authorization Type  BCBS MET dedcutible and OOPvisits are combined with PT/OT/Chiro - 30 total per benefit period    Authorization Time Period  06/28/19-08/09/19    Authorization - Visit Number  8    Authorization - Number of Visits  12    PT Start Time  1031    PT Stop Time  1110    PT Time Calculation (min)  39 min    Equipment Utilized During Treatment  Gait belt    Activity Tolerance  Patient tolerated treatment well    Behavior During Therapy  WFL for tasks assessed/performed       Past Medical History:  Diagnosis Date  . Angina pectoris ([facility])   . Asthma    "as a child and as an adult" (08/02/2018)  . CAD (coronary artery disease), native coronary artery 08/03/2018  . Chronic bronchitis ([facility])   . Chronic kidney disease   . Coronary artery disease   . Family history of adverse reaction to anesthesia    "cousin stopped breathing; he was allergic to the anesthesia" (08/02/2018)  . GERD (gastroesophageal reflux disease)   . High cholesterol   . History of gout   . History of hiatal hernia   . History of kidney stones   . Hypertension 07/2018  . Interstitial cystitis   . NSVT (nonsustained ventricular tachycardia) ([facility]) 07/2018  . Raynaud's disease    "feet and hands" (08/02/2018)  . Raynaud's disease without gangrene 10/15/2018    Past Surgical History:  Procedure Laterality Date  .  ABDOMINAL HYSTERECTOMY    . ANTERIOR CERVICAL DECOMP/DISCECTOMY FUSION    . APPENDECTOMY    . AUGMENTATION MAMMAPLASTY Bilateral   . BACK SURGERY    . CORONARY ANGIOPLASTY WITH STENT PLACEMENT  08/02/2018  . CORONARY STENT INTERVENTION N/A 08/02/2018   Procedure: CORONARY STENT INTERVENTION;  Surgeon: Adrian Prows, MD;  Location: [facility];  Service: Cardiovascular;  Laterality: N/A;  RCA   . CYSTOSCOPY W/ STONE MANIPULATION    . INTRAVASCULAR PRESSURE WIRE/FFR STUDY N/A 10/07/2018   Procedure: INTRAVASCULAR PRESSURE WIRE/FFR STUDY;  Surgeon: Adrian Prows, MD;  Location: [facility];  Service: Cardiovascular;  Laterality: N/A;  . KNEE ARTHROSCOPY Right   . LAPAROSCOPIC CHOLECYSTECTOMY    . LEFT HEART CATH AND CORONARY ANGIOGRAPHY N/A 10/07/2018   Procedure: LEFT HEART CATH AND CORONARY ANGIOGRAPHY;  Surgeon: Adrian Prows, MD;  Location: [facility];  Service: Cardiovascular;  Laterality: N/A;  . REPAIR ANKLE LIGAMENT Left    "tied up ligament; had tore up qthing in my ankle"  . RIGHT HEART CATH  10/07/2018  . RIGHT/LEFT HEART CATH AND CORONARY ANGIOGRAPHY N/A 08/02/2018   Procedure: RIGHT/LEFT HEART CATH AND CORONARY ANGIOGRAPHY;  Surgeon: Adrian Prows, MD;  Location: [facility];  Service: Cardiovascular;  Laterality: N/A;  . SHOULDER SURGERY  bilateral  . TONSILLECTOMY    . TUBAL LIGATION      There were no vitals filed for this visit.  Subjective Assessment - 07/26/19 1036    Subjective  Patient says she feels she is a lot better with strength and balance since starting therapy. Patient says she noticed that she is strong enough to get up from floor now, when doing stretches for her back, but was not able to do this prior to starting therapy.    Pertinent History  Cardiac stent 2019, HTN, Angina    Limitations  Standing;Walking;House hold activities;Other (comment)    How long can you stand comfortably?  10 minutes    How long can you walk comfortably?  Not  sure    Diagnostic tests  MRI    Patient Stated Goals  "Have less pain, walk better"    Currently in Pain?  No/denies    Pain Onset  1 to 4 weeks ago    Pain Onset  1 to 4 weeks ago         [facility] PT Assessment - 07/26/19 0001      Strength   Right Hip Flexion  3+/5   no change, cog wheeling present   Right Hip Extension  3+/5   was 3-/5, cogwheeling present, tested in standing    Right Hip ABduction  4/5   was 3/5 cog wheeling present   Right Hip ADduction  4+/5    Left Hip Flexion  4+/5    Left Hip Extension  4/5   was 3-/5, tested in standing    Left Hip ABduction  4+/5   was 4-/5   Left Hip ADduction  4+/5    Right Knee Flexion  4/5   was 3+/5 cog wheeling present   Right Knee Extension  4/5   was 3/5 with cogwheeling   Left Knee Flexion  5/5    Left Knee Extension  5/5    Right Ankle Dorsiflexion  5/5    Left Ankle Dorsiflexion  5/5      Ambulation/Gait   Ambulation/Gait  Yes    Ambulation/Gait Assistance  5: Supervision    Ambulation Distance (Feet)  340 Feet    Assistive device  None    Gait Pattern  Poor foot clearance - right   RT foot frop when fatigued, intermittent widening of BOS   Ambulation Surface  Level    Gait Comments  2MWT                        Balance Exercises - 07/26/19 1058      Balance Exercises: Standing   Tandem Stance  30 secs;Eyes open;Foam/compliant surface;Intermittent upper extremity support;2 reps    SLS  Eyes open;15 secs;Intermittent upper extremity support;Solid surface;Other (comment);3 reps    Step Over Hurdles / Cones  15'; 2RT; 4 6 inch hurdles no AD        PT Education - 07/26/19 1036    Education Details  Patient educated on findings of reassessment    Person(s) Educated  Patient    Methods  Explanation    Comprehension  Verbalized understanding       PT Short Term Goals - 07/26/19 1037      PT SHORT TERM GOAL #1   Title  Patient will be IND with initial HEP to improve functional outcomes     Time  3    Period  Weeks    Status  Achieved  Target Date  07/19/19      PT SHORT TERM GOAL #2   Title  Patient will have pain in LT knee no greater than 3/10 on average to improve funcitonal mobility    Baseline  Current average 0/10; has been 4/10 at worst this week but average at 0/10    Time  3    Period  Weeks    Status  Achieved    Target Date  07/19/19      PT SHORT TERM GOAL #3   Title  Patient will improve RLE MMT by 1/2 grade to reduce pain and improve functional mobility    Baseline  07/26/19: remains limited in RT hip flexion MMT    Time  3    Period  Weeks    Status  Partially Met    Target Date  07/19/19        PT Long Term Goals - 07/26/19 1040      PT LONG TERM GOAL #1   Title  Patient will improve RLE MMT to greater than or equal to 4/5 throuhout to improve funcional mobility and standing tolerance    Baseline  Has met all but RT hip flexion    Time  6    Period  Weeks    Status  Partially Met      PT LONG TERM GOAL #2   Title  Patient will be able to perform SLS > or equal to 10 seconds on BLE to improve funcitonal mobility and reduce risk of falls    Baseline  Current: 3 seconds bilaterally    Time  6    Period  Weeks    Status  On-going      PT LONG TERM GOAL #3   Title  Patient will be able to walk >30 minutes IND with no increase in pain level to improve funcitonal mobility and ADLs    Baseline  Currently able to walk several hours (reports walking 6000 steps per day)    Time  6    Period  Weeks    Status  Achieved            Plan - 07/26/19 1113    Clinical Impression Statement  Patient has made good progress to LTGs. Patient demos improved strength and gait speed, but continues to be limited by RT hip flexion weakness which is contributing to decreased foot clearance of RT side with ambulating obstacles. Patient also limited in static balance positions, but appears to have some level of improvement in safety awareness with balance  and ambulation tasks. Patient will continue to benefit from skilled therapy services to address remaining deficits to improve level of function with ADLs, functional mobilities and reduce risk for falls.    Personal Factors and Comorbidities  Comorbidity 3+    Comorbidities  HTN, cardiac stent, angina    Examination-Activity Limitations  Locomotion Level;Lift;Transfers;Stand;Stairs;Squat    Examination-Participation Restrictions  Cleaning;Yard Work;Laundry;Community Activity    Stability/Clinical Decision Making  Evolving/Moderate complexity    Rehab Potential  Good    PT Frequency  2x / week    PT Duration  2 weeks    PT Treatment/Interventions  ADLs/Self Care Home Management;Aquatic Therapy;Biofeedback;Cryotherapy;Electrical Stimulation;Moist Heat;Ultrasound;DME Instruction;Gait training;Stair training;Functional mobility training;Therapeutic activities;Therapeutic exercise;Balance training;Manual techniques;Orthotic Fit/Training;Patient/family education;Neuromuscular re-education;Passive range of motion;Joint Manipulations;Taping;Vasopneumatic Device    PT Next Visit Plan  Progress dynamic balance activity. Monitor RT knee pain.    PT Home Exercise Plan  10/23.3/20:  Supine bridge and SLR,  sidelying clamshell.    Consulted and Agree with Plan of Care  Patient       Patient will benefit from skilled therapeutic intervention in order to improve the following deficits and impairments:  Abnormal gait, Decreased endurance, Impaired sensation, Decreased activity tolerance, Decreased strength, Pain, Difficulty walking, Decreased mobility, Decreased balance, Decreased coordination, Improper body mechanics  Visit Diagnosis: Muscle weakness (generalized)  Other abnormalities of gait and mobility     Problem List Patient Active Problem List   Diagnosis Date Noted  . Raynaud's disease without gangrene 10/15/2018  . Angina pectoris ([place]) 10/07/2018  . Essential hypertension 08/03/2018  . CAD  (coronary artery disease), native coronary artery 08/03/2018  . Post PTCA 08/02/2018  . Elevated IgE level 07/19/2018  . Dyspnea on exertion 07/19/2018  . Long-term exposure involving bird droppings 07/19/2018  . Iliotibial band syndrome of left side 10/21/2011   12:48 PM, 07/26/19 Josue Hector PT DPT  Physical Therapist with [facility]  [phone] [address] [place], [place], [address] Phone: [phone]   Fax:  [phone]  Name: Tonya Boyd MRN: [id number] Date of Birth: 03-29-1956

## 2019-07-27 ENCOUNTER — Ambulatory Visit (HOSPITAL_COMMUNITY): Payer: BC Managed Care – PPO | Admitting: Occupational Therapy

## 2019-07-27 DIAGNOSIS — R41841 Cognitive communication deficit: Secondary | ICD-10-CM | POA: Diagnosis not present

## 2019-07-27 DIAGNOSIS — R29898 Other symptoms and signs involving the musculoskeletal system: Secondary | ICD-10-CM

## 2019-07-27 DIAGNOSIS — R278 Other lack of coordination: Secondary | ICD-10-CM

## 2019-07-27 NOTE — Therapy (Signed)
[place], [place], [address] Phone: [phone]   Fax:  [phone]  Occupational Therapy Treatment  Patient Details  Name: Tonya Boyd MRN: [id number] Date of Birth: 1956/01/26 Referring Provider (OT): Dr. Antony Contras   Encounter Date: 07/27/2019  OT End of Session - 07/27/19 1603    Visit Number  8    Number of Visits  12    Date for OT Re-Evaluation  08/09/19    Authorization Type  [employer] COMM PPO    OT Start Time  1518    OT Stop Time  1557    OT Time Calculation (min)  39 min    Activity Tolerance  Patient tolerated treatment well    Behavior During Therapy  [facility] for tasks assessed/performed       Past Medical History:  Diagnosis Date  . Angina pectoris ([place])   . Asthma    "as a child and as an adult" (08/02/2018)  . CAD (coronary artery disease), native coronary artery 08/03/2018  . Chronic bronchitis ([place])   . Chronic kidney disease   . Coronary artery disease   . Family history of adverse reaction to anesthesia    "cousin stopped breathing; he was allergic to the anesthesia" (08/02/2018)  . GERD (gastroesophageal reflux disease)   . High cholesterol   . History of gout   . History of hiatal hernia   . History of kidney stones   . Hypertension 07/2018  . Interstitial cystitis   . NSVT (nonsustained ventricular tachycardia) ([place]) 07/2018  . Raynaud's disease    "feet and hands" (08/02/2018)  . Raynaud's disease without gangrene 10/15/2018    Past Surgical History:  Procedure Laterality Date  . ABDOMINAL HYSTERECTOMY    . ANTERIOR CERVICAL DECOMP/DISCECTOMY FUSION    . APPENDECTOMY    . AUGMENTATION MAMMAPLASTY Bilateral   . BACK SURGERY    . CORONARY ANGIOPLASTY WITH STENT PLACEMENT  08/02/2018  . CORONARY STENT INTERVENTION N/A 08/02/2018   Procedure: CORONARY STENT INTERVENTION;  Surgeon: Adrian Prows, MD;  Location: [place] [facility];  Service: Cardiovascular;  Laterality: N/A;   RCA   . CYSTOSCOPY W/ STONE MANIPULATION    . INTRAVASCULAR PRESSURE WIRE/FFR STUDY N/A 10/07/2018   Procedure: INTRAVASCULAR PRESSURE WIRE/FFR STUDY;  Surgeon: Adrian Prows, MD;  Location: [facility];  Service: Cardiovascular;  Laterality: N/A;  . KNEE ARTHROSCOPY Right   . LAPAROSCOPIC CHOLECYSTECTOMY    . LEFT HEART CATH AND CORONARY ANGIOGRAPHY N/A 10/07/2018   Procedure: LEFT HEART CATH AND CORONARY ANGIOGRAPHY;  Surgeon: Adrian Prows, MD;  Location: [facility];  Service: Cardiovascular;  Laterality: N/A;  . REPAIR ANKLE LIGAMENT Left    "tied up ligament; had tore up qthing in my ankle"  . RIGHT HEART CATH  10/07/2018  . RIGHT/LEFT HEART CATH AND CORONARY ANGIOGRAPHY N/A 08/02/2018   Procedure: RIGHT/LEFT HEART CATH AND CORONARY ANGIOGRAPHY;  Surgeon: Adrian Prows, MD;  Location: [facility];  Service: Cardiovascular;  Laterality: N/A;  . SHOULDER SURGERY     bilateral  . TONSILLECTOMY    . TUBAL LIGATION      There were no vitals filed for this visit.  Subjective Assessment - 07/27/19 1518    Subjective   S: I did a lot today, housecleaning mostly.    Currently in Pain?  Yes    Pain Score  5     Pain Location  Elbow    Pain Orientation  Right    Pain Descriptors / Indicators  Sore    Pain Type  Acute pain    Pain Radiating Towards  N/A    Pain Onset  Yesterday    Pain Frequency  Intermittent    Aggravating Factors   unsure    Pain Relieving Factors  compression sleeve on elbow    Effect of Pain on Daily Activities  mod effect    Multiple Pain Sites  No                   OT Treatments/Exercises (OP) - 07/27/19 1519      Exercises   Exercises  Hand      Hand Exercises   Hand Gripper with Large Beads  all beads gripper at 42#    Hand Gripper with Medium Beads  all beads gripper at 35#    Hand Gripper with Small Beads  all beads gripper at 25#    Other Hand Exercises  Pt used green clothespin and 3 point pinch to stack 5 piles of 5 sponges  into towers. Pt then switched to lateral pinch to put 25 sponges back into bucket using green clothespin. Attempted blue clothespin however pt unable to operate successfully.     Other Hand Exercises  Pt completed card shuffling task, spitting deck into 2 stacks and bending to shuffle. Slightly increased time to complete, OT cuing to hold cards so that they land on the table for improved success.       Neurological Re-education Exercises   Shoulder Flexion  Strengthening;15 reps   1#   Shoulder ABduction  Strengthening;15 reps   1#   Shoulder Protraction  Strengthening;15 reps   1#   Shoulder Horizontal ABduction  Strengthening;15 reps   1#   Shoulder External Rotation  Strengthening;15 reps   1#   Shoulder Internal Rotation  Strengthening;15 reps   1#   Elbow Flexion  Strengthening;15 reps   hammer, supinated, pronated; 2#   Elbow Extension  Strengthening;15 reps   hammer, supinated, pronated; 2#              OT Short Term Goals - 07/06/19 1022      OT SHORT TERM GOAL #1   Title  Pt will be provided with and educated on HEP to improve RUE use as non-dominant during functional tasks.    Time  3    Period  Weeks    Status  On-going    Target Date  08/09/19      OT SHORT TERM GOAL #2   Title  Pt will improve right grip strength by 8# and pinch strength by 3# to improve ability to hold dishes when washing.    Time  3    Period  Weeks    Status  On-going        OT Long Term Goals - 07/06/19 1022      OT LONG TERM GOAL #1   Title  Pt will increase RUE strength to 4+/5 to improve ability to lift items into overhead cabinets safely using RUE as assist.    Time  6    Period  Weeks    Status  On-going      OT LONG TERM GOAL #2   Title  Pt will increase right grip strength by 15# and pinch strength by 6# to increase ability to carry bags or handheld items during ADLs.    Time  6    Period  Weeks  Status  On-going      OT LONG TERM GOAL #3   Title  Pt will  improve fine motor coordination by completing 9 hole peg test in under 26" to improve skills required for fixing hair.    Time  6    Period  Weeks    Status  On-going      OT LONG TERM GOAL #4   Title  Pt will decrease pain in RUE to 3/10 or less to improve ability to sleep at night.    Time  6    Period  Weeks    Status  On-going            Plan - 07/27/19 1604    Clinical Impression Statement  A: Resumed RUE strengthening today, pt reporting increased soreness in elbow after completing housekeeping tasks this am. Pt requiring occasional rest breaks during strengthening tasks. Also continued with gripper tasks and resumed pinch strengthening. Unable to progress to blue clothespin. Added card shuffling activity, min difficulty and increased time required. Verbal cuing for form and technique.    Body Structure / Function / Physical Skills  ADL;UE functional use;Pain;FMC;GMC;Sensation;IADL;Strength    Plan  P: Complete therapy ball strengthening for RUE, follow up on card shuffling at home       Patient will benefit from skilled therapeutic intervention in order to improve the following deficits and impairments:   Body Structure / Function / Physical Skills: ADL, UE functional use, Pain, FMC, GMC, Sensation, IADL, Strength       Visit Diagnosis: Other lack of coordination  Other symptoms and signs involving the musculoskeletal system    Problem List Patient Active Problem List   Diagnosis Date Noted  . Raynaud's disease without gangrene 10/15/2018  . Angina pectoris ([facility]) 10/07/2018  . Essential hypertension 08/03/2018  . CAD (coronary artery disease), native coronary artery 08/03/2018  . Post PTCA 08/02/2018  . Elevated IgE level 07/19/2018  . Dyspnea on exertion 07/19/2018  . Long-term exposure involving bird droppings 07/19/2018  . Iliotibial band syndrome of left side 10/21/2011   Ezra SitesLeslie Vincenzo Stave, OTR/L  [phone] 07/27/2019, 4:06 PM  [facility] [address] [place], [place], [address] Phone: [phone]   Fax:  [phone]  Name: Drucie Ipatricia A Biermann MRN: [id number] Date of Birth: 12/02/1955

## 2019-07-28 ENCOUNTER — Ambulatory Visit: Payer: BLUE CROSS/BLUE SHIELD | Admitting: Cardiology

## 2019-07-31 ENCOUNTER — Ambulatory Visit (HOSPITAL_COMMUNITY): Payer: BC Managed Care – PPO | Admitting: Speech Pathology

## 2019-07-31 ENCOUNTER — Telehealth: Payer: Self-pay

## 2019-07-31 ENCOUNTER — Encounter (HOSPITAL_COMMUNITY): Payer: Self-pay | Admitting: Physical Therapy

## 2019-07-31 ENCOUNTER — Ambulatory Visit (HOSPITAL_COMMUNITY): Payer: BC Managed Care – PPO | Admitting: Physical Therapy

## 2019-07-31 ENCOUNTER — Other Ambulatory Visit: Payer: Self-pay

## 2019-07-31 ENCOUNTER — Ambulatory Visit (HOSPITAL_COMMUNITY): Payer: BC Managed Care – PPO | Admitting: Occupational Therapy

## 2019-07-31 ENCOUNTER — Encounter (HOSPITAL_COMMUNITY): Payer: Self-pay | Admitting: Occupational Therapy

## 2019-07-31 ENCOUNTER — Encounter (HOSPITAL_COMMUNITY): Payer: Self-pay | Admitting: Speech Pathology

## 2019-07-31 DIAGNOSIS — R278 Other lack of coordination: Secondary | ICD-10-CM

## 2019-07-31 DIAGNOSIS — M6281 Muscle weakness (generalized): Secondary | ICD-10-CM

## 2019-07-31 DIAGNOSIS — R41841 Cognitive communication deficit: Secondary | ICD-10-CM

## 2019-07-31 DIAGNOSIS — R2689 Other abnormalities of gait and mobility: Secondary | ICD-10-CM

## 2019-07-31 DIAGNOSIS — R29898 Other symptoms and signs involving the musculoskeletal system: Secondary | ICD-10-CM

## 2019-07-31 NOTE — Therapy (Signed)
[place], [place], [address] Phone: [phone]   Fax:  [phone]  Speech Language Pathology Treatment  Patient Details  Name: Tonya Boyd MRN: [id number] Date of Birth: 1955/10/24 Referring Provider (SLP): Fernande Bras, MD   Encounter Date: 07/31/2019  End of Session - 07/31/19 1030    Visit Number  10    Number of Visits  11    Date for SLP Re-Evaluation  08/02/19    Authorization Type  BCBS PPO   no deductible, OOP $[id number] met   SLP Start Time  0950    SLP Stop Time   1033    SLP Time Calculation (min)  43 min    Activity Tolerance  Patient tolerated treatment well       Past Medical History:  Diagnosis Date  . Angina pectoris ([place])   . Asthma    "as a child and as an adult" (08/02/2018)  . CAD (coronary artery disease), native coronary artery 08/03/2018  . Chronic bronchitis ([place])   . Chronic kidney disease   . Coronary artery disease   . Family history of adverse reaction to anesthesia    "cousin stopped breathing; he was allergic to the anesthesia" (08/02/2018)  . GERD (gastroesophageal reflux disease)   . High cholesterol   . History of gout   . History of hiatal hernia   . History of kidney stones   . Hypertension 07/2018  . Interstitial cystitis   . NSVT (nonsustained ventricular tachycardia) ([place]) 07/2018  . Raynaud's disease    "feet and hands" (08/02/2018)  . Raynaud's disease without gangrene 10/15/2018    Past Surgical History:  Procedure Laterality Date  . ABDOMINAL HYSTERECTOMY    . ANTERIOR CERVICAL DECOMP/DISCECTOMY FUSION    . APPENDECTOMY    . AUGMENTATION MAMMAPLASTY Bilateral   . BACK SURGERY    . CORONARY ANGIOPLASTY WITH STENT PLACEMENT  08/02/2018  . CORONARY STENT INTERVENTION N/A 08/02/2018   Procedure: CORONARY STENT INTERVENTION;  Surgeon: Adrian Prows, MD;  Location: [facility];  Service: Cardiovascular;  Laterality: N/A;  RCA   . CYSTOSCOPY W/ STONE  MANIPULATION    . INTRAVASCULAR PRESSURE WIRE/FFR STUDY N/A 10/07/2018   Procedure: INTRAVASCULAR PRESSURE WIRE/FFR STUDY;  Surgeon: Adrian Prows, MD;  Location: [place] [facility];  Service: Cardiovascular;  Laterality: N/A;  . KNEE ARTHROSCOPY Right   . LAPAROSCOPIC CHOLECYSTECTOMY    . LEFT HEART CATH AND CORONARY ANGIOGRAPHY N/A 10/07/2018   Procedure: LEFT HEART CATH AND CORONARY ANGIOGRAPHY;  Surgeon: Adrian Prows, MD;  Location: [facility] [facility];  Service: Cardiovascular;  Laterality: N/A;  . REPAIR ANKLE LIGAMENT Left    "tied up ligament; had tore up qthing in my ankle"  . RIGHT HEART CATH  10/07/2018  . RIGHT/LEFT HEART CATH AND CORONARY ANGIOGRAPHY N/A 08/02/2018   Procedure: RIGHT/LEFT HEART CATH AND CORONARY ANGIOGRAPHY;  Surgeon: Adrian Prows, MD;  Location: [facility] [facility];  Service: Cardiovascular;  Laterality: N/A;  . SHOULDER SURGERY     bilateral  . TONSILLECTOMY    . TUBAL LIGATION      There were no vitals filed for this visit.  Subjective Assessment - 07/31/19 1029    Subjective  "I just have one more session after this."    Currently in Pain?  No/denies            ADULT SLP TREATMENT - 07/31/19 1029      General Information  Behavior/Cognition  Alert;Cooperative;Pleasant mood    Patient Positioning  Upright in chair    Oral care provided  N/A    HPI  24 year patient with speech difficulty, right sided weakness and numbness from stroke like episode of unclear etiology. MRI brain x 2 negative for stroke. 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 Exam shows nonorganic features.No obvious underlying psychosocial stressors identified.       Treatment Provided   Treatment provided  Cognitive-Linquistic      Pain Assessment   Pain Assessment  No/denies pain      Cognitive-Linquistic Treatment   Treatment focused on  Aphasia;Apraxia;Patient/family/caregiver education    Skilled Treatment  SLP provided skilled treatment targeting  oral reading of paragraphs and poems with a focus on bound grammatical morphemes. She was cued for error awareness 3 times over four short paragraphs and able to self correct once alerted. She completed a picture description task that she did on the initial evaluation and listened to the recording from the evaluation and today. She has made tremendous improvement in fluency and intelligibility and was able to identify the same when listening.       Assessment / Recommendations / Plan   Plan  Continue with current plan of care      Progression Toward Goals   Progression toward goals  Progressing toward goals         SLP Short Term Goals - 07/31/19 1458      SLP SHORT TERM GOAL #1   Title  Pt will produce s-blends in the initial position of words with 90% acc when labeling pictures with min cues for error awareness.    Baseline  25%    Time  4    Period  Weeks    Status  Achieved    Target Date  07/27/19      SLP SHORT TERM GOAL #2   Title  Pt will imitate 7+ word sentences with 95% acc with cue for error awareness for proper grammar/syntax and allowance for articulatory errors.    Baseline  Pt substitutes me/I, is/be, etc; 75%    Time  4    Period  Weeks    Status  Achieved    Target Date  07/27/19      SLP SHORT TERM GOAL #3   Title  Pt will describe pictures by generating sentences of 5+ words using proper grammar and syntax with 90% acc and min cues.    Baseline  70% acc    Time  4    Period  Weeks    Status  Achieved    Target Date  07/27/19      SLP SHORT TERM GOAL #4   Title  Pt will complete functional working memory activities with 90% acc and use of memory strategies when provided mi/mod cues.    Baseline  25% no assist    Time  4    Period  Weeks    Status  Achieved    Target Date  07/27/19      SLP SHORT TERM GOAL #5   Title  Pt will self correct paraphasic errors (also bound morphemes) in conversation and oral reading task 90% of the time with rare min cues  for error awareness.    Baseline  75%    Time  1    Period  Weeks    Status  On-going    Target Date  08/02/19      SLP SHORT  TERM GOAL #6   Title  Pt will increase reading comprehension of short paragraphs with multiple choice and short answer responses to 95% acc with min cues.    Baseline  80%    Time  1    Period  Weeks    Status  On-going    Target Date  08/02/19       SLP Long Term Goals - 07/31/19 1035      SLP LONG TERM GOAL #1   Title  Same as short term goals    Status  On-going       Plan - 07/31/19 1030    Clinical Impression Statement Pt continues to make good progress toward goals with a focus on reading comprehension or oral reading for the final two sessions. She has been given an extensive HEP to continue with post discharge.    Speech Therapy Frequency  2x / week    Duration  1 week    Treatment/Interventions  Cueing hierarchy;SLP instruction and feedback;Compensatory techniques;Compensatory strategies;Functional tasks;Patient/family education;Multimodal communcation approach    Potential to Achieve Goals  Good    SLP Home Exercise Plan  Pt will complete HEP as assigned to facilitate carryover of treatment strategies and techniques in home environment independently.    Consulted and Agree with Plan of Care  Patient       Patient will benefit from skilled therapeutic intervention in order to improve the following deficits and impairments:   Cognitive communication deficit    Problem List Patient Active Problem List   Diagnosis Date Noted  . Raynaud's disease without gangrene 10/15/2018  . Angina pectoris ([place]) 10/07/2018  . Essential hypertension 08/03/2018  . CAD (coronary artery disease), native coronary artery 08/03/2018  . Post PTCA 08/02/2018  . Elevated IgE level 07/19/2018  . Dyspnea on exertion 07/19/2018  . Long-term exposure involving bird droppings 07/19/2018  . Iliotibial band syndrome of left side 10/21/2011   Thank you,  Genene Churn, [place]  [facility] 07/31/2019, 2:59 PM  [place] [address] [place], [place], [address] Phone: [phone]   Fax:  [phone]   Name: MAIYAH GOYNE MRN: [id number] Date of Birth: 1956-06-08

## 2019-07-31 NOTE — Therapy (Signed)
[facility] [address] [place], [place], [address] Phone: [phone]   Fax:  [phone]  Occupational Therapy Treatment  Patient Details  Name: Tonya Boyd MRN: [id number] Date of Birth: 23-May-1956 Referring Provider (OT): Dr. Delia Heady   Encounter Date: 07/31/2019  OT End of Session - 07/31/19 1148    Visit Number  9    Number of Visits  12    Date for OT Re-Evaluation  08/09/19    Authorization Type  BCBS COMM PPO    OT Start Time  1121    OT Stop Time  1200    OT Time Calculation (min)  39 min    Activity Tolerance  Patient tolerated treatment well    Behavior During Therapy  [facility] for tasks assessed/performed       Past Medical History:  Diagnosis Date  . Angina pectoris ([facility])   . Asthma    "as a child and as an adult" (08/02/2018)  . CAD (coronary artery disease), native coronary artery 08/03/2018  . Chronic bronchitis ([facility])   . Chronic kidney disease   . Coronary artery disease   . Family history of adverse reaction to anesthesia    "cousin stopped breathing; he was allergic to the anesthesia" (08/02/2018)  . GERD (gastroesophageal reflux disease)   . High cholesterol   . History of gout   . History of hiatal hernia   . History of kidney stones   . Hypertension 07/2018  . Interstitial cystitis   . NSVT (nonsustained ventricular tachycardia) ([facility]) 07/2018  . Raynaud's disease    "feet and hands" (08/02/2018)  . Raynaud's disease without gangrene 10/15/2018    Past Surgical History:  Procedure Laterality Date  . ABDOMINAL HYSTERECTOMY    . ANTERIOR CERVICAL DECOMP/DISCECTOMY FUSION    . APPENDECTOMY    . AUGMENTATION MAMMAPLASTY Bilateral   . BACK SURGERY    . CORONARY ANGIOPLASTY WITH STENT PLACEMENT  08/02/2018  . CORONARY STENT INTERVENTION N/A 08/02/2018   Procedure: CORONARY STENT INTERVENTION;  Surgeon: [name], MD;  [facility];  Service: Cardiovascular;  Laterality: N/A;   RCA   . CYSTOSCOPY W/ STONE MANIPULATION    . INTRAVASCULAR PRESSURE WIRE/FFR STUDY N/A 10/07/2018   Procedure: INTRAVASCULAR PRESSURE WIRE/FFR STUDY;  Surgeon: [name], MD;  Location: [facility];  Service: Cardiovascular;  Laterality: N/A;  . KNEE ARTHROSCOPY Right   . LAPAROSCOPIC CHOLECYSTECTOMY    . LEFT HEART CATH AND CORONARY ANGIOGRAPHY N/A 10/07/2018   Procedure: LEFT HEART CATH AND CORONARY ANGIOGRAPHY;  Surgeon: [name], MD;  Location: [facility];  Service: Cardiovascular;  Laterality: N/A;  . REPAIR ANKLE LIGAMENT Left    "tied up ligament; had tore up qthing in my ankle"  . RIGHT HEART CATH  10/07/2018  . RIGHT/LEFT HEART CATH AND CORONARY ANGIOGRAPHY N/A 08/02/2018   Procedure: RIGHT/LEFT HEART CATH AND CORONARY ANGIOGRAPHY;  Surgeon: [name], MD;  [facility]: [facility];  Service: Cardiovascular;  Laterality: N/A;  . SHOULDER SURGERY     bilateral  . TONSILLECTOMY    . TUBAL LIGATION      There were no vitals filed for this visit.  Subjective Assessment - 07/31/19 1124    Subjective   S: We played [place] and my hand felt better.    Currently in Pain?  No/denies         [facility] OT Assessment - 07/31/19 1123  Assessment   Medical Diagnosis  stroke-like episode      Precautions   Precautions  None               OT Treatments/Exercises (OP) - 07/31/19 1125      Exercises   Exercises  Hand      Hand Exercises   Hand Gripper with Large Beads  all beads gripper at 42#    Hand Gripper with Medium Beads  all beads gripper at 35#    Hand Gripper with Small Beads  5 beads at 29#; remaining beads gripper at 25#      Neurological Re-education Exercises   Other Exercises 2  green therapy ball: chest press, overhead press, flexion, circles each direction: 10X each      Fine Motor Coordination (Hand/Wrist)   Fine Motor Coordination  Grooved pegs    Grooved pegs  Pt using tweezers to place grooved pegs into pegboard today. Pt has  good operation of tweezers and sustained pinch required for task, increased time for processing shape of hole and direction of peg when placing.                OT Short Term Goals - 07/06/19 1022      OT SHORT TERM GOAL #1   Title  Pt will be provided with and educated on HEP to improve RUE use as non-dominant during functional tasks.    Time  3    Period  Weeks    Status  On-going    Target Date  08/09/19      OT SHORT TERM GOAL #2   Title  Pt will improve right grip strength by 8# and pinch strength by 3# to improve ability to hold dishes when washing.    Time  3    Period  Weeks    Status  On-going        OT Long Term Goals - 07/06/19 1022      OT LONG TERM GOAL #1   Title  Pt will increase RUE strength to 4+/5 to improve ability to lift items into overhead cabinets safely using RUE as assist.    Time  6    Period  Weeks    Status  On-going      OT LONG TERM GOAL #2   Title  Pt will increase right grip strength by 15# and pinch strength by 6# to increase ability to carry bags or handheld items during ADLs.    Time  6    Period  Weeks    Status  On-going      OT LONG TERM GOAL #3   Title  Pt will improve fine motor coordination by completing 9 hole peg test in under 26" to improve skills required for fixing hair.    Time  6    Period  Weeks    Status  On-going      OT LONG TERM GOAL #4   Title  Pt will decrease pain in RUE to 3/10 or less to improve ability to sleep at night.    Time  6    Period  Weeks    Status  On-going            Plan - 07/31/19 1140    Clinical Impression Statement  A: Pt completing RUE strengthening using green therapy ball today. Occasional rest breaks taken as needed. Increased hand gripper to 29# for small beads, reduced to 25# to successfully finish task. Continued  with coordination tasks today, resumed tweezer task. Improvement in time required for pegboard task today. Verbal cuing for form and technique.    Body Structure  / Function / Physical Skills  ADL;UE functional use;Pain;FMC;GMC;Sensation;IADL;Strength    Plan  P: Continue with grip strengthening, complete pvc pipe task in red putty. RUE strengthening       Patient will benefit from skilled therapeutic intervention in order to improve the following deficits and impairments:   Body Structure / Function / Physical Skills: ADL, UE functional use, Pain, FMC, GMC, Sensation, IADL, Strength       Visit Diagnosis: Other lack of coordination  Other symptoms and signs involving the musculoskeletal system    Problem List Patient Active Problem List   Diagnosis Date Noted  . Raynaud's disease without gangrene 10/15/2018  . Angina pectoris ([place]) 10/07/2018  . Essential hypertension 08/03/2018  . CAD (coronary artery disease), native coronary artery 08/03/2018  . Post PTCA 08/02/2018  . Elevated IgE level 07/19/2018  . Dyspnea on exertion 07/19/2018  . Long-term exposure involving bird droppings 07/19/2018  . Iliotibial band syndrome of left side 10/21/2011   Guadelupe Sabin, OTR/L  [phone] 07/31/2019, 12:02 PM  [place], [place], [address] Phone: [phone]   Fax:  [phone]  Name: BLIMA JAIMES MRN: [id number] Date of Birth: 02/28/1956

## 2019-07-31 NOTE — Telephone Encounter (Signed)
-----   Message from Garvin Fila, MD sent at 07/28/2019 12:30 PM EST ----- nform the patient that EEG study was normal

## 2019-07-31 NOTE — Therapy (Signed)
[place], [place], [address] Phone: [phone]   Fax:  [phone]  Physical Therapy Treatment  Patient Details  Name: Tonya Boyd MRN: [id number] Date of Birth: [birth year] Referring Provider (PT): Garvin Fila, MD   Encounter Date: 07/31/2019  PT End of Session - 07/31/19 0902    Visit Number  9    Number of Visits  12    Date for PT Re-Evaluation  08/09/19    Authorization Type  BCBS MET dedcutible and OOPvisits are combined with PT/OT/Chiro - 30 total per benefit period    Authorization Time Period  06/28/19-08/09/19    Authorization - Visit Number  9    Authorization - Number of Visits  12    PT Start Time  0902    PT Stop Time  0947    PT Time Calculation (min)  45 min    Equipment Utilized During Treatment  Gait belt    Activity Tolerance  Patient tolerated treatment well    Behavior During Therapy  [facility] for tasks assessed/performed       Past Medical History:  Diagnosis Date  . Angina pectoris ([place])   . Asthma    "as a child and as an adult" (08/02/2018)  . CAD (coronary artery disease), native coronary artery 08/03/2018  . Chronic bronchitis ([place])   . Chronic kidney disease   . Coronary artery disease   . Family history of adverse reaction to anesthesia    "cousin stopped breathing; he was allergic to the anesthesia" (08/02/2018)  . GERD (gastroesophageal reflux disease)   . High cholesterol   . History of gout   . History of hiatal hernia   . History of kidney stones   . Hypertension 07/2018  . Interstitial cystitis   . NSVT (nonsustained ventricular tachycardia) ([place]) 07/2018  . Raynaud's disease    "feet and hands" (08/02/2018)  . Raynaud's disease without gangrene 10/15/2018    Past Surgical History:  Procedure Laterality Date  . ABDOMINAL HYSTERECTOMY    . ANTERIOR CERVICAL DECOMP/DISCECTOMY FUSION    . APPENDECTOMY    . AUGMENTATION MAMMAPLASTY Bilateral   . BACK SURGERY     . CORONARY ANGIOPLASTY WITH STENT PLACEMENT  08/02/2018  . CORONARY STENT INTERVENTION N/A 08/02/2018   Procedure: CORONARY STENT INTERVENTION;  Surgeon: Adrian Prows, MD;  Location: [place] [facility];  Service: Cardiovascular;  Laterality: N/A;  RCA   . CYSTOSCOPY W/ STONE MANIPULATION    . INTRAVASCULAR PRESSURE WIRE/FFR STUDY N/A 10/07/2018   Procedure: INTRAVASCULAR PRESSURE WIRE/FFR STUDY;  Surgeon: Adrian Prows, MD;  Location: [facility];  Service: Cardiovascular;  Laterality: N/A;  . KNEE ARTHROSCOPY Right   . LAPAROSCOPIC CHOLECYSTECTOMY    . LEFT HEART CATH AND CORONARY ANGIOGRAPHY N/A 10/07/2018   Procedure: LEFT HEART CATH AND CORONARY ANGIOGRAPHY;  Surgeon: Adrian Prows, MD;  Location: [place] [facility];  Service: Cardiovascular;  Laterality: N/A;  . REPAIR ANKLE LIGAMENT Left    "tied up ligament; had tore up qthing in my ankle"  . RIGHT HEART CATH  10/07/2018  . RIGHT/LEFT HEART CATH AND CORONARY ANGIOGRAPHY N/A 08/02/2018   Procedure: RIGHT/LEFT HEART CATH AND CORONARY ANGIOGRAPHY;  Surgeon: Adrian Prows, MD;  Location: [place] [facility];  Service: Cardiovascular;  Laterality: N/A;  . SHOULDER SURGERY     bilateral  . TONSILLECTOMY    . TUBAL LIGATION      There were no vitals filed for  this visit.  Subjective Assessment - 07/31/19 0909    Subjective  Patient says she is doing well today but is a little sore from a lot of walking yesterday. Patient says she walked 6000 steps with her husband yesterday, put some bengay and iced her knees which helped with soreness.    Pertinent History  Cardiac stent 2019, HTN, Angina    Limitations  Standing;Walking;House hold activities;Other (comment)    How long can you stand comfortably?  10 minutes    How long can you walk comfortably?  Not sure    Diagnostic tests  MRI    Patient Stated Goals  "Have less pain, walk better"    Currently in Pain?  Yes    Pain Score  1     Pain Location  Knee    Pain Orientation  Right     Pain Descriptors / Indicators  Aching    Pain Onset  1 to 4 weeks ago    Pain Onset  1 to 4 weeks ago                       [facility] Adult PT Treatment/Exercise - 07/31/19 0001      Knee/Hip Exercises: Standing   Heel Raises  20 reps;Both    Heel Raises Limitations  Toe raise on slope x20    Hip Flexion  Both;2 sets;10 reps    Hip Flexion Limitations  red bands    Hip Abduction  Both;2 sets;10 reps    Abduction Limitations  red band    Hip Extension  2 sets;10 reps;Both    Extension Limitations  red band    Stairs  5 x with cane; 2 x step to; 3 x reciprocal      Knee/Hip Exercises: Seated   Sit to Sand  10 reps;without UE support;2 sets          Balance Exercises - 07/31/19 0927      Balance Exercises: Standing   Tandem Stance  30 secs;Eyes open;Foam/compliant surface;Intermittent upper extremity support;2 reps    SLS  Eyes open;15 secs;Intermittent upper extremity support;Solid surface;Other (comment);3 reps    Gait with Head Turns  3 reps;Forward    Tandem Gait  Forward;3 reps    Sidestepping  Theraband;2 reps   2 RT; 15'; red band    Turning  3 reps   stand pivot turns 3 xRT (6 turns)   Step Over Hurdles / Cones  15'; 2RT; 4 6 inch hurdles no AD          PT Short Term Goals - 07/26/19 1037      PT SHORT TERM GOAL #1   Title  Patient will be IND with initial HEP to improve functional outcomes    Time  3    Period  Weeks    Status  Achieved    Target Date  07/19/19      PT SHORT TERM GOAL #2   Title  Patient will have pain in LT knee no greater than 3/10 on average to improve funcitonal mobility    Baseline  Current average 0/10; has been 4/10 at worst this week but average at 0/10    Time  3    Period  Weeks    Status  Achieved    Target Date  07/19/19      PT SHORT TERM GOAL #3   Title  Patient will improve RLE MMT by 1/2 grade to reduce pain and  improve functional mobility    Baseline  07/26/19: remains limited in RT hip flexion MMT     Time  3    Period  Weeks    Status  Partially Met    Target Date  07/19/19        PT Long Term Goals - 07/26/19 1040      PT LONG TERM GOAL #1   Title  Patient will improve RLE MMT to greater than or equal to 4/5 throuhout to improve funcional mobility and standing tolerance    Baseline  Has met all but RT hip flexion    Time  6    Period  Weeks    Status  Partially Met      PT LONG TERM GOAL #2   Title  Patient will be able to perform SLS > or equal to 10 seconds on BLE to improve funcitonal mobility and reduce risk of falls    Baseline  Current: 3 seconds bilaterally    Time  6    Period  Weeks    Status  On-going      PT LONG TERM GOAL #3   Title  Patient will be able to walk >30 minutes IND with no increase in pain level to improve funcitonal mobility and ADLs    Baseline  Currently able to walk several hours (reports walking 6000 steps per day)    Time  6    Period  Weeks    Status  Achieved            Plan - 07/31/19 0949    Clinical Impression Statement  PAtient tolerated ther ex progressions well today. Added red resistance band to standing hip abd/ ext and added hip flexion in standing for improved RT hip mobility. Also added stand pivots turns for dynamic balance. Overall patient tolerated session well, with no increased complaint of Rt knee pain. Patient able to increase hold times with SLS, and tandem balance. Patient still with some difficulty with RLE clearance over obstacles, and required verbal cues for raising hip and knee for improved clearance over hurdles.    Personal Factors and Comorbidities  Comorbidity 3+    Comorbidities  HTN, cardiac stent, angina    Examination-Activity Limitations  Locomotion Level;Lift;Transfers;Stand;Stairs;Squat    Examination-Participation Restrictions  Cleaning;Yard Work;Laundry;Community Activity    Stability/Clinical Decision Making  Evolving/Moderate complexity    Rehab Potential  Good    PT Frequency  2x / week    PT  Duration  2 weeks    PT Treatment/Interventions  ADLs/Self Care Home Management;Aquatic Therapy;Biofeedback;Cryotherapy;Electrical Stimulation;Moist Heat;Ultrasound;DME Instruction;Gait training;Stair training;Functional mobility training;Therapeutic activities;Therapeutic exercise;Balance training;Manual techniques;Orthotic Fit/Training;Patient/family education;Neuromuscular re-education;Passive range of motion;Joint Manipulations;Taping;Vasopneumatic Device    PT Next Visit Plan  Progress BLE strength as tolerated.    PT Home Exercise Plan  10/23.3/20:  Supine bridge and SLR, sidelying clamshell.    Consulted and Agree with Plan of Care  Patient       Patient will benefit from skilled therapeutic intervention in order to improve the following deficits and impairments:  Abnormal gait, Decreased endurance, Impaired sensation, Decreased activity tolerance, Decreased strength, Pain, Difficulty walking, Decreased mobility, Decreased balance, Decreased coordination, Improper body mechanics  Visit Diagnosis: Muscle weakness (generalized)  Other abnormalities of gait and mobility     Problem List Patient Active Problem List   Diagnosis Date Noted  . Raynaud's disease without gangrene 10/15/2018  . Angina pectoris ([place]) 10/07/2018  . Essential hypertension 08/03/2018  . CAD (coronary artery  disease), native coronary artery 08/03/2018  . Post PTCA 08/02/2018  . Elevated IgE level 07/19/2018  . Dyspnea on exertion 07/19/2018  . Long-term exposure involving bird droppings 07/19/2018  . Iliotibial band syndrome of left side 10/21/2011    10:03 AM, 07/31/19 Josue Hector PT DPT  Physical Therapist with [facility]  [phone] [place], [place], [address] Phone: [phone]   Fax:  [phone]  Name: CARALEE MOREA MRN: [id number] Date of Birth: 29-Sep-1955

## 2019-07-31 NOTE — Telephone Encounter (Signed)
Notes recorded by Marval Regal, RN on 07/31/2019 at 10:39 AM EST  I called pt that EEG was normal. PT verbalized understanding.

## 2019-08-02 ENCOUNTER — Encounter (HOSPITAL_COMMUNITY): Payer: Self-pay | Admitting: Physical Therapy

## 2019-08-02 ENCOUNTER — Ambulatory Visit (HOSPITAL_COMMUNITY): Payer: BC Managed Care – PPO | Admitting: Physical Therapy

## 2019-08-02 ENCOUNTER — Encounter (HOSPITAL_COMMUNITY): Payer: Self-pay | Admitting: Speech Pathology

## 2019-08-02 ENCOUNTER — Ambulatory Visit (HOSPITAL_COMMUNITY): Payer: BC Managed Care – PPO | Admitting: Occupational Therapy

## 2019-08-02 ENCOUNTER — Other Ambulatory Visit: Payer: Self-pay

## 2019-08-02 ENCOUNTER — Ambulatory Visit (HOSPITAL_COMMUNITY): Payer: BC Managed Care – PPO | Admitting: Speech Pathology

## 2019-08-02 ENCOUNTER — Encounter (HOSPITAL_COMMUNITY): Payer: Self-pay | Admitting: Occupational Therapy

## 2019-08-02 DIAGNOSIS — R41841 Cognitive communication deficit: Secondary | ICD-10-CM | POA: Diagnosis not present

## 2019-08-02 DIAGNOSIS — R278 Other lack of coordination: Secondary | ICD-10-CM

## 2019-08-02 DIAGNOSIS — M6281 Muscle weakness (generalized): Secondary | ICD-10-CM

## 2019-08-02 DIAGNOSIS — R29898 Other symptoms and signs involving the musculoskeletal system: Secondary | ICD-10-CM

## 2019-08-02 DIAGNOSIS — R2689 Other abnormalities of gait and mobility: Secondary | ICD-10-CM

## 2019-08-02 NOTE — Therapy (Signed)
[place], [place], [address] Phone: [phone]   Fax:  [phone]  Speech Language Pathology Treatment  Patient Details  Name: Tonya Boyd MRN: [id number] Date of Birth: 1955/09/21 Referring Provider (SLP): Fernande Bras, MD   Encounter Date: 08/02/2019  End of Session - 08/02/19 1016    Visit Number  11    Number of Visits  11    Date for SLP Re-Evaluation  08/02/19    Authorization Type  BCBS PPO   no deductible, OOP $[id number] met   SLP Start Time  0945    SLP Stop Time   1030    SLP Time Calculation (min)  45 min    Activity Tolerance  Patient tolerated treatment well       Past Medical History:  Diagnosis Date  . Angina pectoris ([place])   . Asthma    "as a child and as an adult" (08/02/2018)  . CAD (coronary artery disease), native coronary artery 08/03/2018  . Chronic bronchitis ([place])   . Chronic kidney disease   . Coronary artery disease   . Family history of adverse reaction to anesthesia    "cousin stopped breathing; he was allergic to the anesthesia" (08/02/2018)  . GERD (gastroesophageal reflux disease)   . High cholesterol   . History of gout   . History of hiatal hernia   . History of kidney stones   . Hypertension 07/2018  . Interstitial cystitis   . NSVT (nonsustained ventricular tachycardia) ([place]) 07/2018  . Raynaud's disease    "feet and hands" (08/02/2018)  . Raynaud's disease without gangrene 10/15/2018    Past Surgical History:  Procedure Laterality Date  . ABDOMINAL HYSTERECTOMY    . ANTERIOR CERVICAL DECOMP/DISCECTOMY FUSION    . APPENDECTOMY    . AUGMENTATION MAMMAPLASTY Bilateral   . BACK SURGERY    . CORONARY ANGIOPLASTY WITH STENT PLACEMENT  08/02/2018  . CORONARY STENT INTERVENTION N/A 08/02/2018   Procedure: CORONARY STENT INTERVENTION;  Surgeon: Adrian Prows, MD;  Location: [place] [facility];  Service: Cardiovascular;  Laterality: N/A;  RCA   . CYSTOSCOPY W/ STONE  MANIPULATION    . INTRAVASCULAR PRESSURE WIRE/FFR STUDY N/A 10/07/2018   Procedure: INTRAVASCULAR PRESSURE WIRE/FFR STUDY;  Surgeon: Adrian Prows, MD;  Location: [place] [facility];  Service: Cardiovascular;  Laterality: N/A;  . KNEE ARTHROSCOPY Right   . LAPAROSCOPIC CHOLECYSTECTOMY    . LEFT HEART CATH AND CORONARY ANGIOGRAPHY N/A 10/07/2018   Procedure: LEFT HEART CATH AND CORONARY ANGIOGRAPHY;  Surgeon: Adrian Prows, MD;  Location: [place] [facility];  Service: Cardiovascular;  Laterality: N/A;  . REPAIR ANKLE LIGAMENT Left    "tied up ligament; had tore up qthing in my ankle"  . RIGHT HEART CATH  10/07/2018  . RIGHT/LEFT HEART CATH AND CORONARY ANGIOGRAPHY N/A 08/02/2018   Procedure: RIGHT/LEFT HEART CATH AND CORONARY ANGIOGRAPHY;  Surgeon: Adrian Prows, MD;  Location: [place] [facility];  Service: Cardiovascular;  Laterality: N/A;  . SHOULDER SURGERY     bilateral  . TONSILLECTOMY    . TUBAL LIGATION      There were no vitals filed for this visit.  Subjective Assessment - 08/02/19 0953    Subjective  "If I go slowly, I do well."    Currently in Pain?  No/denies       ADULT SLP TREATMENT - 08/02/19 0001      General Information   Behavior/Cognition  Alert;Cooperative;Pleasant mood  Patient Positioning  Upright in chair    Oral care provided  N/A    HPI  13 year patient with speech difficulty, right sided weakness and numbness from stroke like episode of unclear etiology. MRI brain x 2 negative for stroke. 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 Exam shows nonorganic features.No obvious underlying psychosocial stressors identified.       Treatment Provided   Treatment provided  Cognitive-Linquistic      Pain Assessment   Pain Assessment  No/denies pain      Cognitive-Linquistic Treatment   Treatment focused on  Aphasia;Apraxia;Patient/family/caregiver education    Skilled Treatment  SLP provided skilled treatment targeting moderately complex word  finding via synonym and antonym tasks. Pt found the task challenging with homework, however accuracy increased in session with min SLP cues (encouraged her to use in a sentence to help establish a synonym or antonym). Pt with increased grammatical errors in conversation today and needed error awareness cues to self correct.       Assessment / Recommendations / Plan   Plan  All goals met;Discharge SLP treatment due to (comment)      Progression Toward Goals   Progression toward goals  Goals met, education completed, patient discharged from [facility] - 08/02/19 [place] #1   Title  Pt will produce s-blends in the initial position of words with 90% acc when labeling pictures with min cues for error awareness.    Baseline  25%    Time  4    Period  Weeks    Status  Achieved    Target Date  07/27/19      SLP SHORT TERM GOAL #2   Title  Pt will imitate 7+ word sentences with 95% acc with cue for error awareness for proper grammar/syntax and allowance for articulatory errors.    Baseline  Pt substitutes me/I, is/be, etc; 75%    Time  4    Period  Weeks    Status  Achieved    Target Date  07/27/19      SLP SHORT TERM GOAL #3   Title  Pt will describe pictures by generating sentences of 5+ words using proper grammar and syntax with 90% acc and min cues.    Baseline  70% acc    Time  4    Period  Weeks    Status  Achieved    Target Date  07/27/19      SLP SHORT TERM GOAL #4   Title  Pt will complete functional working memory activities with 90% acc and use of memory strategies when provided mi/mod cues.    Baseline  25% no assist    Time  4    Period  Weeks    Status  Achieved    Target Date  07/27/19      SLP SHORT TERM GOAL #5   Title  Pt will self correct paraphasic errors (also bound morphemes) in conversation and oral reading task 90% of the time with rare min cues for error awareness.    Baseline  75%    Time  1    Period   Weeks    Status  Achieved    Target Date  08/02/19      SLP SHORT TERM GOAL #6   Title  Pt will increase reading comprehension of short paragraphs with multiple choice  and short answer responses to 95% acc with min cues.    Baseline  80%    Time  1    Period  Weeks    Status  Achieved    Target Date  08/02/19       SLP Long Term Goals - 08/02/19 1018      SLP LONG TERM GOAL #1   Title  Same as short term goals    Status  On-going       Plan - 08/02/19 1016    Clinical Impression Statement Pt completed reading comprehension activities for paragraph length material with 100% acc. During oral reading tasks, she benefited from use of her finger to guide and pace her reading and self correct errors. Pt has made good progress toward all goals and is satisfied with her progress. She acknowledges the need to slow down in conversational speech. She has an extensive HEP to continue with post discharge. Pt will be discharged from SLP services at this time.   Treatment/Interventions  Cueing hierarchy;SLP instruction and feedback;Compensatory techniques;Compensatory strategies;Functional tasks;Patient/family education;Multimodal communcation approach    Potential to Achieve Goals  Good    SLP Home Exercise Plan  Pt will complete HEP as assigned to facilitate carryover of treatment strategies and techniques in home environment independently.    Consulted and Agree with Plan of Care  Patient       Patient will benefit from skilled therapeutic intervention in order to improve the following deficits and impairments:   Cognitive communication deficit    Problem List Patient Active Problem List   Diagnosis Date Noted  . Raynaud's disease without gangrene 10/15/2018  . Angina pectoris ([place]) 10/07/2018  . Essential hypertension 08/03/2018  . CAD (coronary artery disease), native coronary artery 08/03/2018  . Post PTCA 08/02/2018  . Elevated IgE level 07/19/2018  . Dyspnea on exertion  07/19/2018  . Long-term exposure involving bird droppings 07/19/2018  . Iliotibial band syndrome of left side 10/21/2011   SPEECH THERAPY DISCHARGE SUMMARY  Visits from Start of Care: 11  Current functional level related to goals / functional outcomes: See above   Remaining deficits: Mild expressive language deficits   Education / Equipment: Continue with HEP going forward.  Plan: Patient agrees to discharge.  Patient goals were met. Patient is being discharged due to being pleased with the current functional level.  ?????         Thank you,  Genene Churn, [place]  [facility] 08/02/2019, 10:19 AM  [place], [place], [address] Phone: [phone]   Fax:  [phone]   Name: RUFUS CYPERT MRN: [id number] Date of Birth: 1955/10/24

## 2019-08-02 NOTE — Therapy (Signed)
[place], [place], [address] Phone: [phone]   Fax:  [phone]  Occupational Therapy Treatment  Patient Details  Name: Tonya Boyd MRN: [id number] Date of Birth: 1956/09/07 Referring Provider (OT): Dr. Antony Contras   Encounter Date: 08/02/2019  OT End of Session - 08/02/19 0931    Visit Number  10    Number of Visits  12    Date for OT Re-Evaluation  08/09/19    Authorization Type  BCBS COMM PPO    OT Start Time  0854    OT Stop Time  0933    OT Time Calculation (min)  39 min    Activity Tolerance  Patient tolerated treatment well    Behavior During Therapy  [facility] for tasks assessed/performed       Past Medical History:  Diagnosis Date  . Angina pectoris ([place])   . Asthma    "as a child and as an adult" (08/02/2018)  . CAD (coronary artery disease), native coronary artery 08/03/2018  . Chronic bronchitis ([place])   . Chronic kidney disease   . Coronary artery disease   . Family history of adverse reaction to anesthesia    "cousin stopped breathing; he was allergic to the anesthesia" (08/02/2018)  . GERD (gastroesophageal reflux disease)   . High cholesterol   . History of gout   . History of hiatal hernia   . History of kidney stones   . Hypertension 07/2018  . Interstitial cystitis   . NSVT (nonsustained ventricular tachycardia) ([place]) 07/2018  . Raynaud's disease    "feet and hands" (08/02/2018)  . Raynaud's disease without gangrene 10/15/2018    Past Surgical History:  Procedure Laterality Date  . ABDOMINAL HYSTERECTOMY    . ANTERIOR CERVICAL DECOMP/DISCECTOMY FUSION    . APPENDECTOMY    . AUGMENTATION MAMMAPLASTY Bilateral   . BACK SURGERY    . CORONARY ANGIOPLASTY WITH STENT PLACEMENT  08/02/2018  . CORONARY STENT INTERVENTION N/A 08/02/2018   Procedure: CORONARY STENT INTERVENTION;  Surgeon: Adrian Prows, MD;  Location: [place] [facility];  Service: Cardiovascular;  Laterality: N/A;   RCA   . CYSTOSCOPY W/ STONE MANIPULATION    . INTRAVASCULAR PRESSURE WIRE/FFR STUDY N/A 10/07/2018   Procedure: INTRAVASCULAR PRESSURE WIRE/FFR STUDY;  Surgeon: Adrian Prows, MD;  Location: [place] [facility];  Service: Cardiovascular;  Laterality: N/A;  . KNEE ARTHROSCOPY Right   . LAPAROSCOPIC CHOLECYSTECTOMY    . LEFT HEART CATH AND CORONARY ANGIOGRAPHY N/A 10/07/2018   Procedure: LEFT HEART CATH AND CORONARY ANGIOGRAPHY;  Surgeon: Adrian Prows, MD;  Location: [place] [facility];  Service: Cardiovascular;  Laterality: N/A;  . REPAIR ANKLE LIGAMENT Left    "tied up ligament; had tore up qthing in my ankle"  . RIGHT HEART CATH  10/07/2018  . RIGHT/LEFT HEART CATH AND CORONARY ANGIOGRAPHY N/A 08/02/2018   Procedure: RIGHT/LEFT HEART CATH AND CORONARY ANGIOGRAPHY;  Surgeon: Adrian Prows, MD;  Location: [place] [facility];  Service: Cardiovascular;  Laterality: N/A;  . SHOULDER SURGERY     bilateral  . TONSILLECTOMY    . TUBAL LIGATION      There were no vitals filed for this visit.  Subjective Assessment - 08/02/19 0852    Subjective   S: My elbow is a little sore today.    Currently in Pain?  Yes    Pain Score  4     Pain Location  Elbow    Pain Orientation  Right    Pain Descriptors / Indicators  Sore    Pain Type  Acute pain    Pain Radiating Towards  n/a    Pain Onset  1 to 4 weeks ago    Pain Frequency  Intermittent    Aggravating Factors   unsure    Pain Relieving Factors  compression sleeve on elbow    Effect of Pain on Daily Activities  min/mod effect on ADLs    Multiple Pain Sites  No         [facility] OT Assessment - 08/02/19 0851      Assessment   Medical Diagnosis  stroke-like episode      Precautions   Precautions  None               OT Treatments/Exercises (OP) - 08/02/19 0856      Exercises   Exercises  Hand      Hand Exercises   Hand Gripper with Large Beads  all beads gripper at 38#    Hand Gripper with Medium Beads  all beads gripper at 35#     Hand Gripper with Small Beads  all beads gripper at 25#    Other Hand Exercises  Pt used blue clothespin and 3 point pinch to place 20 sponges into bucket.     Other Hand Exercises  Pt utilized pvc pipe to push circles into flattened red putty to focus on UB strengthening and grip strength.       Neurological Re-education Exercises   Shoulder Flexion  Strengthening;15 reps   1#   Shoulder ABduction  Strengthening;15 reps   1#   Shoulder Protraction  Strengthening;15 reps   1#   Shoulder Horizontal ABduction  Strengthening;15 reps   1#   Shoulder External Rotation  Strengthening;15 reps   1#   Shoulder Internal Rotation  Strengthening;15 reps   1#   Elbow Flexion  Strengthening;15 reps   1# for supinated; A/ROM for hammer and pronated   Elbow Extension  Strengthening;15 reps   1# for supinated; A/ROM for hammer and pronated   Other Exercises 1  attempted UBE: pt unable to complete due to elbow pain; also attempted ball on wall, pt unable to complete    Other Exercises 2  proximal shoulder strengthening: 10X each, 1#, no rest breaks    Theraputty - Flatten  red-seated               OT Short Term Goals - 07/06/19 1022      OT SHORT TERM GOAL #1   Title  Pt will be provided with and educated on HEP to improve RUE use as non-dominant during functional tasks.    Time  3    Period  Weeks    Status  On-going    Target Date  08/09/19      OT SHORT TERM GOAL #2   Title  Pt will improve right grip strength by 8# and pinch strength by 3# to improve ability to hold dishes when washing.    Time  3    Period  Weeks    Status  On-going        OT Long Term Goals - 07/06/19 1022      OT LONG TERM GOAL #1   Title  Pt will increase RUE strength to 4+/5 to improve ability to lift items into overhead cabinets safely using RUE as assist.    Time  6    Period  Weeks  Status  On-going      OT LONG TERM GOAL #2   Title  Pt will increase right grip strength by 15# and pinch  strength by 6# to increase ability to carry bags or handheld items during ADLs.    Time  6    Period  Weeks    Status  On-going      OT LONG TERM GOAL #3   Title  Pt will improve fine motor coordination by completing 9 hole peg test in under 26" to improve skills required for fixing hair.    Time  6    Period  Weeks    Status  On-going      OT LONG TERM GOAL #4   Title  Pt will decrease pain in RUE to 3/10 or less to improve ability to sleep at night.    Time  6    Period  Weeks    Status  On-going            Plan - 08/02/19 0913    Clinical Impression Statement  A: Completed grip strengthening first this session, resuming red putty tasks with pvc pipe and flattening. Pt requiring occasional rest breaks for elbow soreness. Continued with RUE strengthening this session, resuming free weight tasks, reduced weight for elbow exercises due to soreness. Attempted UBE and ball on wall, pt unable to complete due to elbow pain. Verbal cuing for form and technique.    Body Structure / Function / Physical Skills  ADL;UE functional use;Pain;FMC;GMC;Sensation;IADL;Strength    Plan  P: Reassessment, Discharge with HEP       Patient will benefit from skilled therapeutic intervention in order to improve the following deficits and impairments:   Body Structure / Function / Physical Skills: ADL, UE functional use, Pain, FMC, GMC, Sensation, IADL, Strength       Visit Diagnosis: Other lack of coordination  Other symptoms and signs involving the musculoskeletal system    Problem List Patient Active Problem List   Diagnosis Date Noted  . Raynaud's disease without gangrene 10/15/2018  . Angina pectoris ([facility]) 10/07/2018  . Essential hypertension 08/03/2018  . CAD (coronary artery disease), native coronary artery 08/03/2018  . Post PTCA 08/02/2018  . Elevated IgE level 07/19/2018  . Dyspnea on exertion 07/19/2018  . Long-term exposure involving bird droppings 07/19/2018  . Iliotibial  band syndrome of left side 10/21/2011   Ezra SitesLeslie Liora Myles, OTR/L  [phone] 08/02/2019, 9:35 AM  [facility] [address] [place], [place], [address] Phone: [phone]   Fax:  [phone]  Name: Drucie Ipatricia A Buzan MRN: [id number] Date of Birth: 10/26/1955

## 2019-08-02 NOTE — Therapy (Signed)
[place], [place], [address] Phone: [phone]   Fax:  [phone]  Physical Therapy Treatment  Patient Details  Name: Tonya Boyd MRN: [id number] Date of Birth: Apr 17, 1956 Referring Provider (PT): Garvin Fila, MD   Encounter Date: 08/02/2019  PT End of Session - 08/02/19 1039    Visit Number  10    Number of Visits  12    Date for PT Re-Evaluation  08/09/19    Authorization Type  BCBS MET dedcutible and OOPvisits are combined with PT/OT/Chiro - 30 total per benefit period    Authorization Time Period  06/28/19-08/09/19    Authorization - Visit Number  10    Authorization - Number of Visits  12    PT Start Time  6734    PT Stop Time  1117    PT Time Calculation (min)  42 min    Equipment Utilized During Treatment  Gait belt    Activity Tolerance  Patient tolerated treatment well    Behavior During Therapy  [facility] for tasks assessed/performed       Past Medical History:  Diagnosis Date  . Angina pectoris ([place])   . Asthma    "as a child and as an adult" (08/02/2018)  . CAD (coronary artery disease), native coronary artery 08/03/2018  . Chronic bronchitis ([place])   . Chronic kidney disease   . Coronary artery disease   . Family history of adverse reaction to anesthesia    "cousin stopped breathing; he was allergic to the anesthesia" (08/02/2018)  . GERD (gastroesophageal reflux disease)   . High cholesterol   . History of gout   . History of hiatal hernia   . History of kidney stones   . Hypertension 07/2018  . Interstitial cystitis   . NSVT (nonsustained ventricular tachycardia) ([place]) 07/2018  . Raynaud's disease    "feet and hands" (08/02/2018)  . Raynaud's disease without gangrene 10/15/2018    Past Surgical History:  Procedure Laterality Date  . ABDOMINAL HYSTERECTOMY    . ANTERIOR CERVICAL DECOMP/DISCECTOMY FUSION    . APPENDECTOMY    . AUGMENTATION MAMMAPLASTY Bilateral   . BACK  SURGERY    . CORONARY ANGIOPLASTY WITH STENT PLACEMENT  08/02/2018  . CORONARY STENT INTERVENTION N/A 08/02/2018   Procedure: CORONARY STENT INTERVENTION;  Surgeon: Adrian Prows, MD;  Location: [place] [facility];  Service: Cardiovascular;  Laterality: N/A;  RCA   . CYSTOSCOPY W/ STONE MANIPULATION    . INTRAVASCULAR PRESSURE WIRE/FFR STUDY N/A 10/07/2018   Procedure: INTRAVASCULAR PRESSURE WIRE/FFR STUDY;  Surgeon: Adrian Prows, MD;  Location: [place] [facility];  Service: Cardiovascular;  Laterality: N/A;  . KNEE ARTHROSCOPY Right   . LAPAROSCOPIC CHOLECYSTECTOMY    . LEFT HEART CATH AND CORONARY ANGIOGRAPHY N/A 10/07/2018   Procedure: LEFT HEART CATH AND CORONARY ANGIOGRAPHY;  Surgeon: Adrian Prows, MD;  Location: [place] [facility];  Service: Cardiovascular;  Laterality: N/A;  . REPAIR ANKLE LIGAMENT Left    "tied up ligament; had tore up qthing in my ankle"  . RIGHT HEART CATH  10/07/2018  . RIGHT/LEFT HEART CATH AND CORONARY ANGIOGRAPHY N/A 08/02/2018   Procedure: RIGHT/LEFT HEART CATH AND CORONARY ANGIOGRAPHY;  Surgeon: Adrian Prows, MD;  Location: [place] [facility];  Service: Cardiovascular;  Laterality: N/A;  . SHOULDER SURGERY     bilateral  . TONSILLECTOMY    . TUBAL LIGATION      There were no vitals filed for  this visit.  Subjective Assessment - 08/02/19 1038    Subjective  Patient says she can march with her RT leg now. Patient says it hurts her knee to do, but she is happy that she can lift her RT leg now.    Pertinent History  Cardiac stent 2019, HTN, Angina    Limitations  Standing;Walking;House hold activities;Other (comment)    How long can you stand comfortably?  10 minutes    How long can you walk comfortably?  Not sure    Diagnostic tests  MRI    Patient Stated Goals  "Have less pain, walk better"    Currently in Pain?  Yes    Pain Score  3     Pain Location  Knee    Pain Orientation  Right;Anterior    Pain Descriptors / Indicators  Aching    Pain Onset  1 to 4 weeks  ago    Pain Onset  1 to 4 weeks ago                       [facility] Adult PT Treatment/Exercise - 08/02/19 1043      Knee/Hip Exercises: Machines for Strengthening   Other [employer] walkouts 40# x 5 fwd; 5 x 20#  lateral both sides      Knee/Hip Exercises: Standing   Heel Raises  20 reps;Both    Heel Raises Limitations  Toe raise on slope x20    Hip Flexion  Both;2 sets;10 reps    Hip Flexion Limitations  red bands    Hip Abduction  Both;2 sets;10 reps    Abduction Limitations  red band    Hip Extension  2 sets;10 reps;Both    Extension Limitations  red band    Stairs  7"; 4 x with cane; 2 x step to; 2 x reciprocal    Gait Training  Gait training no AD, 226 feet, cueing for heel to toe transition on RLE      Knee/Hip Exercises: Seated   Sit to Sand  10 reps;without UE support;2 sets          Balance Exercises - 08/02/19 1049      Balance Exercises: Standing   Tandem Stance  30 secs;Eyes open;Foam/compliant surface;Intermittent upper extremity support;2 reps    SLS  Eyes open;15 secs;Intermittent upper extremity support;Other (comment);3 reps;Foam/compliant surface    SLS with Vectors  --   3 cone tap/ SLS vectors with cones x5 each leg    Gait with Head Turns  3 reps;Forward    Tandem Gait  Forward;3 reps    Turning  --   stand pivot turns; 6 x    Step Over Hurdles / Cones  15'; 2RT; 4 6 inch hurdles no AD          PT Short Term Goals - 07/26/19 1037      PT SHORT TERM GOAL #1   Title  Patient will be IND with initial HEP to improve functional outcomes    Time  3    Period  Weeks    Status  Achieved    Target Date  07/19/19      PT SHORT TERM GOAL #2   Title  Patient will have pain in LT knee no greater than 3/10 on average to improve funcitonal mobility    Baseline  Current average 0/10; has been 4/10 at worst this week but average at 0/10    Time  3  Period  Weeks    Status  Achieved    Target Date  07/19/19      PT SHORT TERM  GOAL #3   Title  Patient will improve RLE MMT by 1/2 grade to reduce pain and improve functional mobility    Baseline  07/26/19: remains limited in RT hip flexion MMT    Time  3    Period  Weeks    Status  Partially Met    Target Date  07/19/19        PT Long Term Goals - 07/26/19 1040      PT LONG TERM GOAL #1   Title  Patient will improve RLE MMT to greater than or equal to 4/5 throuhout to improve funcional mobility and standing tolerance    Baseline  Has met all but RT hip flexion    Time  6    Period  Weeks    Status  Partially Met      PT LONG TERM GOAL #2   Title  Patient will be able to perform SLS > or equal to 10 seconds on BLE to improve funcitonal mobility and reduce risk of falls    Baseline  Current: 3 seconds bilaterally    Time  6    Period  Weeks    Status  On-going      PT LONG TERM GOAL #3   Title  Patient will be able to walk >30 minutes IND with no increase in pain level to improve funcitonal mobility and ADLs    Baseline  Currently able to walk several hours (reports walking 6000 steps per day)    Time  6    Period  Weeks    Status  Achieved            Plan - 08/02/19 1042    Clinical Impression Statement  Patient tolerated session well today. Patient shows continued improvement in gait and dynamic balance activity. Patient able to ambulate stairs 2x with reciprocal gait using hand rail x 1, but had increased RT knee pain with descending. Patient educated on step to pattern for descending when RT knee is hurting to avoid increased pain and improve safety on stairs. Patient had decreased RT foot clearance with gait training but was able to correct with verbal cueing for heel to toe transitioning. Patient progressed to SLS on foam. Patient to transition to IND with home program for DC next visit.    Personal Factors and Comorbidities  Comorbidity 3+    Comorbidities  HTN, cardiac stent, angina    Examination-Activity Limitations  Locomotion  Level;Lift;Transfers;Stand;Stairs;Squat    Examination-Participation Restrictions  Cleaning;Yard Work;Laundry;Community Activity    Stability/Clinical Decision Making  Evolving/Moderate complexity    Rehab Potential  Good    PT Frequency  2x / week    PT Duration  2 weeks    PT Treatment/Interventions  ADLs/Self Care Home Management;Aquatic Therapy;Biofeedback;Cryotherapy;Electrical Stimulation;Moist Heat;Ultrasound;DME Instruction;Gait training;Stair training;Functional mobility training;Therapeutic activities;Therapeutic exercise;Balance training;Manual techniques;Orthotic Fit/Training;Patient/family education;Neuromuscular re-education;Passive range of motion;Joint Manipulations;Taping;Vasopneumatic Device    PT Next Visit Plan  Progress to IND with home program next visit.    PT Home Exercise Plan  10/23.3/20:  Supine bridge and SLR, sidelying clamshell.    Consulted and Agree with Plan of Care  Patient       Patient will benefit from skilled therapeutic intervention in order to improve the following deficits and impairments:  Abnormal gait, Decreased endurance, Impaired sensation, Decreased activity tolerance, Decreased strength, Pain, Difficulty  walking, Decreased mobility, Decreased balance, Decreased coordination, Improper body mechanics  Visit Diagnosis: Muscle weakness (generalized)  Other abnormalities of gait and mobility     Problem List Patient Active Problem List   Diagnosis Date Noted  . Raynaud's disease without gangrene 10/15/2018  . Angina pectoris ([place]) 10/07/2018  . Essential hypertension 08/03/2018  . CAD (coronary artery disease), native coronary artery 08/03/2018  . Post PTCA 08/02/2018  . Elevated IgE level 07/19/2018  . Dyspnea on exertion 07/19/2018  . Long-term exposure involving bird droppings 07/19/2018  . Iliotibial band syndrome of left side 10/21/2011   11:31 AM, 08/02/19 Josue Hector PT DPT  Physical Therapist with [facility]  [phone] [place] [address] [place], [place], [address] Phone: [phone]   Fax:  [phone]  Name: Tonya Boyd MRN: [id number] Date of Birth: [birth year]

## 2019-08-07 ENCOUNTER — Other Ambulatory Visit: Payer: Self-pay

## 2019-08-07 ENCOUNTER — Encounter (HOSPITAL_COMMUNITY): Payer: Self-pay

## 2019-08-07 ENCOUNTER — Encounter (HOSPITAL_COMMUNITY): Payer: Self-pay | Admitting: Physical Therapy

## 2019-08-07 ENCOUNTER — Ambulatory Visit (HOSPITAL_COMMUNITY): Payer: BC Managed Care – PPO

## 2019-08-07 ENCOUNTER — Ambulatory Visit (HOSPITAL_COMMUNITY): Payer: BC Managed Care – PPO | Admitting: Physical Therapy

## 2019-08-07 DIAGNOSIS — M79601 Pain in right arm: Secondary | ICD-10-CM

## 2019-08-07 DIAGNOSIS — R29898 Other symptoms and signs involving the musculoskeletal system: Secondary | ICD-10-CM

## 2019-08-07 DIAGNOSIS — R2689 Other abnormalities of gait and mobility: Secondary | ICD-10-CM

## 2019-08-07 DIAGNOSIS — M6281 Muscle weakness (generalized): Secondary | ICD-10-CM

## 2019-08-07 DIAGNOSIS — R278 Other lack of coordination: Secondary | ICD-10-CM

## 2019-08-07 DIAGNOSIS — R41841 Cognitive communication deficit: Secondary | ICD-10-CM | POA: Diagnosis not present

## 2019-08-07 NOTE — Therapy (Signed)
**Note De-Identified Tonya Obfuscation** [place], [place], [address] Phone: [phone]   Fax:  [phone]  Occupational Therapy Treatment Reassessment/discharge Patient Details  Name: Tonya Boyd MRN: [id number] Date of Birth: 25-Oct-1955 Referring Provider (OT): Dr. Antony Contras   Encounter Date: 08/07/2019  OT End of Session - 08/07/19 1355    Visit Number  11    Number of Visits  12    Authorization Type  BCBS COMM PPO    OT Start Time  5277   Pt arrived late   OT Stop Time  1346    OT Time Calculation (min)  33 min    Activity Tolerance  Patient tolerated treatment well    Behavior During Therapy  [facility] for tasks assessed/performed       Past Medical History:  Diagnosis Date  . Angina pectoris ([place])   . Asthma    "as a child and as an adult" (08/02/2018)  . CAD (coronary artery disease), native coronary artery 08/03/2018  . Chronic bronchitis ([place])   . Chronic kidney disease   . Coronary artery disease   . Family history of adverse reaction to anesthesia    "cousin stopped breathing; he was allergic to the anesthesia" (08/02/2018)  . GERD (gastroesophageal reflux disease)   . High cholesterol   . History of gout   . History of hiatal hernia   . History of kidney stones   . Hypertension 07/2018  . Interstitial cystitis   . NSVT (nonsustained ventricular tachycardia) ([place]) 07/2018  . Raynaud's disease    "feet and hands" (08/02/2018)  . Raynaud's disease without gangrene 10/15/2018    Past Surgical History:  Procedure Laterality Date  . ABDOMINAL HYSTERECTOMY    . ANTERIOR CERVICAL DECOMP/DISCECTOMY FUSION    . APPENDECTOMY    . AUGMENTATION MAMMAPLASTY Bilateral   . BACK SURGERY    . CORONARY ANGIOPLASTY WITH STENT PLACEMENT  08/02/2018  . CORONARY STENT INTERVENTION N/A 08/02/2018   Procedure: CORONARY STENT INTERVENTION;  Surgeon: Adrian Prows, MD;  Location: [facility];  Service: Cardiovascular;  Laterality: N/A;   RCA   . CYSTOSCOPY W/ STONE MANIPULATION    . INTRAVASCULAR PRESSURE WIRE/FFR STUDY N/A 10/07/2018   Procedure: INTRAVASCULAR PRESSURE WIRE/FFR STUDY;  Surgeon: Adrian Prows, MD;  Location: [facility];  Service: Cardiovascular;  Laterality: N/A;  . KNEE ARTHROSCOPY Right   . LAPAROSCOPIC CHOLECYSTECTOMY    . LEFT HEART CATH AND CORONARY ANGIOGRAPHY N/A 10/07/2018   Procedure: LEFT HEART CATH AND CORONARY ANGIOGRAPHY;  Surgeon: Adrian Prows, MD;  Location: [facility];  Service: Cardiovascular;  Laterality: N/A;  . REPAIR ANKLE LIGAMENT Left    "tied up ligament; had tore up qthing in my ankle"  . RIGHT HEART CATH  10/07/2018  . RIGHT/LEFT HEART CATH AND CORONARY ANGIOGRAPHY N/A 08/02/2018   Procedure: RIGHT/LEFT HEART CATH AND CORONARY ANGIOGRAPHY;  Surgeon: Adrian Prows, MD;  Location: [facility];  Service: Cardiovascular;  Laterality: N/A;  . SHOULDER SURGERY     bilateral  . TONSILLECTOMY    . TUBAL LIGATION      There were no vitals filed for this visit.  Subjective Assessment - 08/07/19 1321    Currently in Pain?  Yes    Pain Score  5     Pain Location  Elbow    Pain Orientation  Right    Pain Descriptors / Indicators  Sore    Pain Type  Acute pain  Pain Radiating Towards  N/A    Pain Onset  1 to 4 weeks ago    Pain Frequency  Constant    Aggravating Factors   unsure. increased use    Pain Relieving Factors  compression sleeve on elbow. myofascial release    Effect of Pain on Daily Activities  min/mod effect on ADLs    Multiple Pain Sites  No         [facility] OT Assessment - 08/07/19 1322      Assessment   Medical Diagnosis  stroke-like episode      Precautions   Precautions  None      Coordination   9 Hole Peg Test  Right    Right 9 Hole Peg Test  24.3"   previous: 32.06."     ROM / Strength   AROM / PROM / Strength  Strength      Strength   Overall Strength Comments  Assessed seated, shoulder er/IR abducted    Strength Assessment Site   Shoulder;Elbow;Forearm;Wrist    Right/Left Shoulder  Right    Right Shoulder Flexion  4+/5   previous: 4-/5   Right Shoulder ABduction  5/5   previous: 4-/5   Right Shoulder Internal Rotation  3+/5   previous: 4-/5 elbow pain   Right Shoulder External Rotation  3+/5   previous: same *elbow pain   Right/Left Elbow  Right    Right Elbow Flexion  4-/5   previous: 3/5 *elbow pain   Right Elbow Extension  3+/5   previous: 3-/5 *elbow pain   Right/Left Forearm  Right    Right Forearm Pronation  5/5   previous: 3/5   Right Forearm Supination  3/5   previous: 3/5 *elbow pain   Right Wrist Flexion  5/5   previous: 4-/5   Right Wrist Extension  3+/5   previous: 3/5 *elbow pain   Right Wrist Radial Deviation  3/5   previous: same *elbow pain   Right Wrist Ulnar Deviation  3/5   previous: same *elbow pain   Right/Left hand  Right    Right Hand Grip (lbs)  35   previous: 35   Right Hand Lateral Pinch  13 lbs   previous: 7   Right Hand 3 Point Pinch  10 lbs   preivous: 6                      OT Education - 08/07/19 1354    Education Details  Reviewed HEP. Added shoulder and elbow strengthening while telling patient to complete only if her elbow pain allows her. Encouraged her to discuss her elbow pain with her MD at the next follow up appointment; either PCP or nuerologist.    Person(s) Educated  Patient    Methods  Explanation;Handout    Comprehension  Verbalized understanding       OT Short Term Goals - 08/07/19 1335      OT SHORT TERM GOAL #1   Title  Pt will be provided with and educated on HEP to improve RUE use as non-dominant during functional tasks.    Time  3    Period  Weeks    Status  Achieved    Target Date  08/09/19      OT SHORT TERM GOAL #2   Title  Pt will improve right grip strength by 8# and pinch strength by 3# to improve ability to hold dishes when washing.    Time  3  Period  Weeks    Status  Partially Met        OT Long Term  Goals - 08/07/19 1336      OT LONG TERM GOAL #1   Title  Pt will increase RUE strength to 4+/5 to improve ability to lift items into overhead cabinets safely using RUE as assist.    Time  6    Period  Weeks    Status  Partially Met      OT LONG TERM GOAL #2   Title  Pt will increase right grip strength by 15# and pinch strength by 6# to increase ability to carry bags or handheld items during ADLs.    Time  6    Period  Weeks    Status  Partially Met      OT LONG TERM GOAL #3   Title  Pt will improve fine motor coordination by completing 9 hole peg test in under 26" to improve skills required for fixing hair.    Time  6    Period  Weeks    Status  Achieved      OT LONG TERM GOAL #4   Title  Pt will decrease pain in RUE to 3/10 or less to improve ability to sleep at night.    Baseline  Pt reports that her pain will fluctuates from 3/10-4/10    Time  6    Period  Weeks    Status  Partially Met            Plan - 08/07/19 1357    Clinical Impression Statement  A: Reassessment completed this date as patient requesting to be discharged with HEP due to insurance coverage starting over. She has met 1/2 STGs and 1/4LTGs with all remaining goals partially met. Pt reports increased right elbow pain which has been present since her stroke-life episode. Pt has made improvement with pinch strength and some RUE strengthening. Elbow pain was a contributing factor when attempting strengthening testing today. HEP was updated and reviewed. All questions were addressed.    Body Structure / Function / Physical Skills  ADL;UE functional use;Pain;FMC;GMC;Sensation;IADL;Strength    Plan  P:D/C from OT Services with HEP.    Consulted and Agree with Plan of Care  Patient       Patient will benefit from skilled therapeutic intervention in order to improve the following deficits and impairments:   Body Structure / Function / Physical Skills: ADL, UE functional use, Pain, FMC, GMC, Sensation, IADL,  Strength       Visit Diagnosis: Pain in right arm  Other lack of coordination  Other symptoms and signs involving the musculoskeletal system    Problem List Patient Active Problem List   Diagnosis Date Noted  . Raynaud's disease without gangrene 10/15/2018  . Angina pectoris ([place]) 10/07/2018  . Essential hypertension 08/03/2018  . CAD (coronary artery disease), native coronary artery 08/03/2018  . Post PTCA 08/02/2018  . Elevated IgE level 07/19/2018  . Dyspnea on exertion 07/19/2018  . Long-term exposure involving bird droppings 07/19/2018  . Iliotibial band syndrome of left side 10/21/2011    OCCUPATIONAL THERAPY DISCHARGE SUMMARY  Visits from Start of Care: 11  Current functional level related to goals / functional outcomes: See above   Remaining deficits: See above   Education / Equipment: See above Plan: Patient agrees to discharge.  Patient goals were partially met. Patient is being discharged due to the patient's request.  ?????  Ailene Ravel, OTR/L,CBIS  [phone]  08/07/2019, 2:09 PM  [facility] [address] [place], [place], [address] Phone: [phone]   Fax:  [phone]  Name: Tonya Boyd MRN: [id number] Date of Birth: 19-Sep-1955

## 2019-08-07 NOTE — Patient Instructions (Signed)
Access Code: [email or web address]  URL: [email or web address]  Date: 08/07/2019  Prepared by: Josue Hector   Exercises Standing 3-Way Leg Reach with Resistance at Ankles and Unilateral Counter Support - 10 reps - 2 sets - 1x daily - 4x weekly Standing Tandem Balance with Counter Support - 3 reps - 1 sets - 30 hold - 1x daily - 7x weekly Standing Single Leg Stance with Unilateral Counter Support - 3 reps - 1 sets - 15 hold - 1x daily - 7x weekly Forward Step Over with Beam (AKA) - 10 reps - 2 sets - 1x daily - 7x weekly Side Stepping with Counter Support - 10 reps - 1 sets - 1x daily - 7x weekly

## 2019-08-07 NOTE — Patient Instructions (Signed)
Complete the following exercises as your elbow pain allows you to once a day. Complete 1 set. 12-15 repetitions.  FREE WEIGHT FLEXION IN NEUTRAL ROTATION  Start with your arms down by your side. While holding a free weight with your palm facing your side and your elbows straight raise up your arm forward as shown then return to starting position.       Overhead Press  Raise your elbows to shoulder height out to your side. Raise your arms over your head. Hold and then gently lower your arms to shoulder height.        FREE WEIGHT - ABDUCTION  While holding a weight and elbow straight, bring up your arm to the side.   SHOULDER ABC's  While standing and holding a small ball or free weight, write out the alphabet in the air with your arm.  Only your arm should be moving as you perform this.        BICEP CURLS  With your arm at your side, draw up your hand by bending at the elbow.   Keep your palm face up the entire time.      BICEPS CURLS - BRACHIORADIALIS - HAMMER CURL  With your arm at your side, draw up your hand by bending at the elbow. Keep your palm pointed inward towards your body the entire time.     HAMMER PRONATION SUPINATION  Slowly lower a hammer towards the inside and then outside of the body as shown.

## 2019-08-07 NOTE — Therapy (Addendum)
[facility] [address] [place], [place], [address] Phone: [phone]   Fax:  [phone]  Physical Therapy Treatment/ Discharge Summary  Patient Details  Name: Tonya Boyd MRN: [id number] Date of Birth: 12/27/1955 Referring Provider (PT): Garvin Fila, MD   Encounter Date: 08/07/2019   PHYSICAL THERAPY DISCHARGE SUMMARY  Visits from Start of Care: 11  Current functional level related to goals / functional outcomes: See below    Remaining deficits: See below   Education / Equipment: See assessment  Plan: Patient agrees to discharge.  Patient goals were met. Patient is being discharged due to being pleased with the current functional level.  ?????       PT End of Session - 08/07/19 1421    Visit Number  11    Number of Visits  12    Date for PT Re-Evaluation  08/09/19    Authorization Type  BCBS MET dedcutible and OOPvisits are combined with PT/OT/Chiro - 30 total per benefit period    Authorization Time Period  06/28/19-08/09/19    Authorization - Visit Number  11    Authorization - Number of Visits  12    PT Start Time  1346    PT Stop Time  1430    PT Time Calculation (min)  44 min    Activity Tolerance  Patient tolerated treatment well    Behavior During Therapy  WFL for tasks assessed/performed       Past Medical History:  Diagnosis Date  . Angina pectoris ([place])   . Asthma    "as a child and as an adult" (08/02/2018)  . CAD (coronary artery disease), native coronary artery 08/03/2018  . Chronic bronchitis ([place])   . Chronic kidney disease   . Coronary artery disease   . Family history of adverse reaction to anesthesia    "cousin stopped breathing; he was allergic to the anesthesia" (08/02/2018)  . GERD (gastroesophageal reflux disease)   . High cholesterol   . History of gout   . History of hiatal hernia   . History of kidney stones   . Hypertension 07/2018  . Interstitial cystitis   . NSVT  (nonsustained ventricular tachycardia) ([place]) 07/2018  . Raynaud's disease    "feet and hands" (08/02/2018)  . Raynaud's disease without gangrene 10/15/2018    Past Surgical History:  Procedure Laterality Date  . ABDOMINAL HYSTERECTOMY    . ANTERIOR CERVICAL DECOMP/DISCECTOMY FUSION    . APPENDECTOMY    . AUGMENTATION MAMMAPLASTY Bilateral   . BACK SURGERY    . CORONARY ANGIOPLASTY WITH STENT PLACEMENT  08/02/2018  . CORONARY STENT INTERVENTION N/A 08/02/2018   Procedure: CORONARY STENT INTERVENTION;  Surgeon: Adrian Prows, MD;  Location: [facility];  Service: Cardiovascular;  Laterality: N/A;  RCA   . CYSTOSCOPY W/ STONE MANIPULATION    . INTRAVASCULAR PRESSURE WIRE/FFR STUDY N/A 10/07/2018   Procedure: INTRAVASCULAR PRESSURE WIRE/FFR STUDY;  Surgeon: Adrian Prows, MD;  Location: [facility];  Service: Cardiovascular;  Laterality: N/A;  . KNEE ARTHROSCOPY Right   . LAPAROSCOPIC CHOLECYSTECTOMY    . LEFT HEART CATH AND CORONARY ANGIOGRAPHY N/A 10/07/2018   Procedure: LEFT HEART CATH AND CORONARY ANGIOGRAPHY;  Surgeon: Adrian Prows, MD;  Location: [facility];  Service: Cardiovascular;  Laterality: N/A;  . REPAIR ANKLE LIGAMENT Left    "tied up ligament; had tore up qthing in my ankle"  . RIGHT HEART CATH  10/07/2018  . RIGHT/LEFT  HEART CATH AND CORONARY ANGIOGRAPHY N/A 08/02/2018   Procedure: RIGHT/LEFT HEART CATH AND CORONARY ANGIOGRAPHY;  Surgeon: Adrian Prows, MD;  Location: [facility];  Service: Cardiovascular;  Laterality: N/A;  . SHOULDER SURGERY     bilateral  . TONSILLECTOMY    . TUBAL LIGATION      There were no vitals filed for this visit.  Subjective Assessment - 08/07/19 1349    Subjective  Patient says she is doing well and is ready to be finished with therapy. Patient says she still has intermittent pain in RT knee but has been able to manage symptoms with rest and icing as needed.    Pertinent History  Cardiac stent 2019, HTN, Angina     Limitations  Standing;Walking;House hold activities;Other (comment)    How long can you stand comfortably?  20 minutes    How long can you walk comfortably?  20 minutes    Diagnostic tests  MRI    Patient Stated Goals  "Have less pain, walk better"    Currently in Pain?  Yes    Pain Score  2     Pain Location  Knee    Pain Orientation  Right    Pain Descriptors / Indicators  Aching    Pain Type  Acute pain    Pain Onset  Yesterday    Pain Frequency  Intermittent    Aggravating Factors   walking far, prolonged standing    Pain Relieving Factors  rest, ice, elevation    Effect of Pain on Daily Activities  Limiting    Pain Onset  1 to 4 weeks ago         [facility] PT Assessment - 08/07/19 1357      Assessment   Medical Diagnosis  stroke-like episode    Referring Provider (PT)  Garvin Fila, MD    Onset Date/Surgical Date  06/08/19    Next MD Visit  08/15/19      Precautions   Precautions  None      Restrictions   Weight Bearing Restrictions  No      Balance Screen   Has the patient fallen in the past 6 months  No    Has the patient had a decrease in activity level because of a fear of falling?   No    Is the patient reluctant to leave their home because of a fear of falling?   No      Prior Function   Level of Independence  Independent      Cognition   Overall Cognitive Status  Within Functional Limits for tasks assessed      Strength   Right Hip Flexion  4/5   was 3+; tested in standing    Right Hip Extension  4+/5   was 3+; tested in standing    Right Hip ABduction  4/5   tested in standing    Left Hip Flexion  4+/5    Left Hip Extension  4/5    Left Hip ABduction  4+/5    Right Knee Flexion  4/5    Right Knee Extension  4/5    Left Knee Flexion  5/5    Left Knee Extension  5/5    Right Ankle Dorsiflexion  5/5    Left Ankle Dorsiflexion  5/5      Ambulation/Gait   Ambulation/Gait  Yes    Ambulation/Gait Assistance  6: Modified independent  (Device/Increase time)    Ambulation Distance (Feet)  360 Feet    Assistive device  None    Gait Pattern  Poor foot clearance - right;Trendelenburg;Lateral trunk lean to right    Ambulation Surface  Level    Gait Comments  2MWT      Balance   Balance Assessed  Yes      Static Standing Balance   Static Standing Balance -  Activities   Single Leg Stance - Right Leg;Tandam Stance - Right Leg;Single Leg Stance - Left Leg;Tandam Stance - Left Leg    Static Standing - Comment/# of Minutes  16 sec; 30 sec; 2 sec; 30 sec      Berg Balance Test   Sit to Stand  Able to stand without using hands and stabilize independently    Standing Unsupported  Able to stand safely 2 minutes    Sitting with Back Unsupported but Feet Supported on Floor or Stool  Able to sit safely and securely 2 minutes    Stand to Sit  Sits safely with minimal use of hands    Transfers  Able to transfer safely, minor use of hands    Standing Unsupported with Eyes Closed  Able to stand 10 seconds safely    Standing Unsupported with Feet Together  Able to place feet together independently and stand 1 minute safely    From Standing, Reach Forward with Outstretched Arm  Can reach forward >12 cm safely (5")    From Standing Position, Pick up Object from Floor  Able to pick up shoe safely and easily    From Standing Position, Turn to Look Behind Over each Shoulder  Looks behind one side only/other side shows less weight shift    Turn 360 Degrees  Able to turn 360 degrees safely in 4 seconds or less    Standing Unsupported, Alternately Place Feet on Step/Stool  Able to stand independently and safely and complete 8 steps in 20 seconds    Standing Unsupported, One Foot in Front  Able to plae foot ahead of the other independently and hold 30 seconds    Standing on One Leg  Tries to lift leg/unable to hold 3 seconds but remains standing independently    Total Score  50                   [facility] Adult PT Treatment/Exercise -  08/07/19 0001      Knee/Hip Exercises: Standing   Heel Raises  20 reps;Both    Heel Raises Limitations  Toe raise on slope x20    Hip Flexion  Both;2 sets;10 reps    Hip Flexion Limitations  red bands    Hip Abduction  Both;2 sets;10 reps    Abduction Limitations  red band    Hip Extension  2 sets;10 reps;Both    Extension Limitations  red band          Balance Exercises - 08/07/19 1414      Balance Exercises: Standing   Tandem Stance  Eyes open;1 rep;30 secs    SLS  1 rep;15 secs;Eyes open;Upper extremity support 1    Sidestepping  1 rep        PT Education - 08/07/19 1428    Education Details  Patient educated on POC, DC status and HEP    Person(s) Educated  Patient    Methods  Explanation;Handout    Comprehension  Verbalized understanding       PT Short Term Goals - 08/07/19 1421      PT SHORT  TERM GOAL #1   Title  Patient will be IND with initial HEP to improve functional outcomes    Time  3    Period  Weeks    Status  Achieved    Target Date  07/19/19      PT SHORT TERM GOAL #2   Title  Patient will have pain in LT knee no greater than 3/10 on average to improve funcitonal mobility    Baseline  Current average 0/10; has been 4/10 at worst this week but average at 0/10    Time  3    Period  Weeks    Status  Achieved    Target Date  07/19/19      PT SHORT TERM GOAL #3   Title  Patient will improve RLE MMT by 1/2 grade to reduce pain and improve functional mobility    Time  3    Period  Weeks    Status  Achieved    Target Date  07/19/19        PT Long Term Goals - 08/07/19 1422      PT LONG TERM GOAL #1   Title  Patient will improve RLE MMT to greater than or equal to 4/5 throuhout to improve funcional mobility and standing tolerance    Time  6    Period  Weeks    Status  Achieved      PT LONG TERM GOAL #2   Title  Patient will be able to perform SLS > or equal to 10 seconds on BLE to improve funcitonal mobility and reduce risk of falls     Baseline  Current: 16 second on R SLS; 2 sec on LT    Time  6    Period  Weeks    Status  Partially Met      PT LONG TERM GOAL #3   Title  Patient will be able to walk >30 minutes IND with no increase in pain level to improve funcitonal mobility and ADLs    Baseline  Currently able to walk several hours (reports walking 6000 steps per day)    Time  6    Period  Weeks    Status  Achieved            Plan - 08/07/19 1612    Clinical Impression Statement  Patient has made good progress to LTGs. Patient has met all goals, except continues to have some difficulty with balancing on LT in SLS. Patient has made good progress with strength and balance otherwise, though does demo cogwheel type movement with resisted testing of RLE. Patient demos significant improvement in BERG score as well as 2MWT, but occasionally drops RT foot especially when fatigued. Patient is able to correct RT foot drop with verbal cueing. Patient educated on progress and updated HEP handout. Patient being DC today with all goals met/ partially met. Patient instructed to follow up with physical therapy services with any further questions or concerns.    Personal Factors and Comorbidities  Comorbidity 3+    Comorbidities  HTN, cardiac stent, angina    Examination-Activity Limitations  Locomotion Level;Lift;Transfers;Stand;Stairs;Squat    Examination-Participation Restrictions  Cleaning;Yard Work;Laundry;Community Activity    Stability/Clinical Decision Making  Evolving/Moderate complexity    Rehab Potential  Good    PT Treatment/Interventions  ADLs/Self Care Home Management;Aquatic Therapy;Biofeedback;Cryotherapy;Electrical Stimulation;Moist Heat;Ultrasound;DME Instruction;Gait training;Stair training;Functional mobility training;Therapeutic activities;Therapeutic exercise;Balance training;Manual techniques;Orthotic Fit/Training;Patient/family education;Neuromuscular re-education;Passive range of motion;Joint  Manipulations;Taping;Vasopneumatic Device    PT Next Visit  Plan  DC    PT Home Exercise Plan  10/23.3/20:  Supine bridge and SLR, sidelying clamshell.    Consulted and Agree with Plan of Care  Patient       Patient will benefit from skilled therapeutic intervention in order to improve the following deficits and impairments:  Abnormal gait, Decreased endurance, Impaired sensation, Decreased activity tolerance, Decreased strength, Pain, Difficulty walking, Decreased mobility, Decreased balance, Decreased coordination, Improper body mechanics  Visit Diagnosis: Muscle weakness (generalized)  Other abnormalities of gait and mobility     Problem List Patient Active Problem List   Diagnosis Date Noted  . Raynaud's disease without gangrene 10/15/2018  . Angina pectoris ([place]) 10/07/2018  . Essential hypertension 08/03/2018  . CAD (coronary artery disease), native coronary artery 08/03/2018  . Post PTCA 08/02/2018  . Elevated IgE level 07/19/2018  . Dyspnea on exertion 07/19/2018  . Long-term exposure involving bird droppings 07/19/2018  . Iliotibial band syndrome of left side 10/21/2011   4:20 PM, 08/07/19 Josue Hector PT DPT  Physical Therapist with [facility]  ([phone] [place] [address] [place], [place], [address] Phone: [phone]   Fax:  [phone]  Name: Tonya Boyd MRN: [id number] Date of Birth: August 17, 1956

## 2019-08-09 ENCOUNTER — Encounter (HOSPITAL_COMMUNITY): Payer: BC Managed Care – PPO | Admitting: Occupational Therapy

## 2019-08-09 ENCOUNTER — Ambulatory Visit (HOSPITAL_COMMUNITY): Payer: BC Managed Care – PPO | Admitting: Physical Therapy

## 2019-08-14 ENCOUNTER — Telehealth: Payer: Self-pay

## 2019-08-14 NOTE — Telephone Encounter (Signed)
Pt called and wants to know if her knee pain is a side effect of the Repatha. She says the pain is very bad and keeps her up at night. Please advise if there is another option

## 2019-08-14 NOTE — Telephone Encounter (Signed)
Pt will hold repatha and see if her symptoms improve.

## 2019-08-14 NOTE — Telephone Encounter (Signed)
Repatha has no effect on anything except cholesterol, just like insulin on sugar, so unlikely it is from [place],  She can hold for couple weeks and check

## 2019-08-15 ENCOUNTER — Encounter: Payer: Self-pay | Admitting: Adult Health

## 2019-08-15 ENCOUNTER — Other Ambulatory Visit: Payer: Self-pay | Admitting: Cardiology

## 2019-08-15 ENCOUNTER — Ambulatory Visit: Payer: BC Managed Care – PPO | Admitting: Adult Health

## 2019-08-15 ENCOUNTER — Other Ambulatory Visit: Payer: Self-pay

## 2019-08-15 VITALS — BP 125/82 | HR 91 | Temp 97.1°F | Ht 65.0 in | Wt 160.2 lb

## 2019-08-15 DIAGNOSIS — E785 Hyperlipidemia, unspecified: Secondary | ICD-10-CM

## 2019-08-15 DIAGNOSIS — R479 Unspecified speech disturbances: Secondary | ICD-10-CM

## 2019-08-15 DIAGNOSIS — R2990 Unspecified symptoms and signs involving the nervous system: Secondary | ICD-10-CM | POA: Diagnosis not present

## 2019-08-15 DIAGNOSIS — I10 Essential (primary) hypertension: Secondary | ICD-10-CM | POA: Diagnosis not present

## 2019-08-15 DIAGNOSIS — R202 Paresthesia of skin: Secondary | ICD-10-CM

## 2019-08-15 DIAGNOSIS — R41841 Cognitive communication deficit: Secondary | ICD-10-CM

## 2019-08-15 DIAGNOSIS — R200 Anesthesia of skin: Secondary | ICD-10-CM

## 2019-08-15 MED ORDER — DIVALPROEX SODIUM ER 250 MG PO TB24: 250.0000 mg | ORAL_TABLET | Freq: Every day | ORAL | 0 refills | Status: DC

## 2019-08-15 NOTE — Patient Instructions (Addendum)
Trial decreasing Depakote dosage to 250 mg daily -if you experience reoccurring headaches or worsening of your symptoms, please call office and it would be recommended to increase dosage back to 500 mg daily  Continue aspirin 81 mg daily for secondary stroke prevention  Continue to follow with your cardiologist for cholesterol management  Continue to follow up with PCP/cardiology regarding cholesterol and blood pressure management   Continue to do exercises at home as recommended during therapy sessions for ongoing improvement  Referral placed to neuropsychology for neurocognitive evaluation  Continue to monitor blood pressure at home  Maintain strict control of hypertension with blood pressure goal below 130/90, diabetes with hemoglobin A1c goal below 6.5% and cholesterol with LDL cholesterol (bad cholesterol) goal below 70 mg/dL. I also advised the patient to eat a healthy diet with plenty of whole grains, cereals, fruits and vegetables, exercise regularly and maintain ideal body weight.  Followup in the future with me in 4 months or call earlier if needed       Thank you for coming to see [place] at [facility]. I hope we have been able to provide you high quality care today.  You may receive a patient satisfaction survey over the next few weeks. We would appreciate your feedback and comments so that we may continue to improve ourselves and the health of our patients.

## 2019-08-15 NOTE — Progress Notes (Signed)
[facility] [address] [place]. [place] [address] [phone]       OFFICE FOLLOW UP NOTE  Ms. Jeronica Stlouis Wey Date of Birth:  1955/09/26 Medical Record Number:  [id number]   Referring MD: Kennis Carina Reason for Referral: Stroke like episode  Chief Complaint  Patient presents with   Follow-up    RM 9, w/ husband. Last seen 06/15/2019. Taking  Depakote ER 500 mg daily. PT/OT done via [facility] outpt. She is now done with this. She is ambulating with cane. Doing exercises at home. She fell about a month ago, was sitting on front porch on swing. Did not hit her head., no signifiicant injuries.      HPI:  Initial visit 06/15/2019 PS: Ms. Digeronimo is a 63 year old Caucasian lady seen today for initial office consultation visit.  She is accompanied by her husband.  History is obtained from them, review of electronic medical records and I personally reviewed imaging films in PACS.  She has past medical history of hypertension, hyperlipidemia, coronary artery disease, kidney stones, hiatal hernia, gastroesophageal reflux disease, chronic bronchitis.  She states she had episode in early September when she woke up she felt she could not think right and she had brain fog she was off balance numbness on the right side of the face and tongue as well as right hand she is went and saw her primary care physician who ordered an outpatient MRI scan of the brain done on 05/17/2019 at [facility] imaging which I personally reviewed showed no acute abnormalities.  Mild changes of chronic small vessel disease only noted.  She was seen in the ER on 06/08/2019 with sudden onset of speech change and right-sided weakness.  She also had some chest pain in the morning and took some nitroglycerin.  She also had a headache.  She states she does not get many headaches.  CT scan of the head in the ER was unremarkable.  She had some subjective speech difficulties and right-sided weakness with giveaway weakness as  per Dr. Alene Mires exam.  MRI scan of the brain was obtained which showed no acute abnormality and only changes of small vessel disease.  MRI of the brain showed no significant large vessel stenosis..  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   Patient states symptoms have persisted since then.  She is able to speak now but has trouble speaking and speaks in with a childlike quality to her speech.  Still has subjective weakness and numbness on the right side but is able to walk without assistance.  She states she cannot think because she has brain fog and will not be able to return to work.  She feels better.  She denies any prior known history of strokes TIAs or seizures.  She does take aspirin every day and is on medication for blood pressure and cholesterol for her coronary artery disease.  She denies prior history of migraines or headaches with neurological symptoms.  She denies any significant ongoing stress in her life.  Her daughter died 5 years ago but she states she is coping with that.  Patient feels she is unable to work because she gets confused and the brain cannot think right.  She has in fact not even been driving.  Update 08/15/2019: Ms. Furnari is a 63 year old female who is being seen today for follow-up accompanied by her husband.  She continues to have speech difficulty with hesitancy and childlike quality.  She also continues to  complain of cognition and memory difficulties with getting confused easily as well as subjective right-sided weakness and numbness.  She continues to ambulate with a cane and does endorse a couple falls due to tripping on her right foot due to reported weakness.  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  Currently on short-term disability by PCP and plans on pursuing disability in the near future.  She has since completed therapies with reports of improvement of prior  deficits.  After prior visit, underwent EEG which was normal. She was started on Depakote 500 mg daily at prior visit due to potential complicated migraine versus seizure activity which she continues on without difficulty.  She denies reoccurring headaches or worsening of symptoms.  She does endorse occasional depression or sadness which has been more present thinking of her daughter that passed away 5 years ago.  She also endorses increased stress/anxiety at times that can worsen her cognition and speech.  She continues on aspirin 81 mg daily without bleeding or bruising.  Previously prescribed Repatha but due to joint pain, this has been discontinued and plans on following with cardiology for initiation of a different type of cholesterol lowering agent.    Continues to follow with cardiology regularly for HTN and HLD management.  Blood pressure today 125/82.  No further concerns at this time.      ROS:   14 system review of systems is positive for speech difficulty, memory difficulty, trouble thinking, weakness, numbness, gait difficulty, joint pain all other systems negative   PMH:  Past Medical History:  Diagnosis Date   Angina pectoris (HCC)    Asthma    "as a child and as an adult" (08/02/2018)   CAD (coronary artery disease), native coronary artery 08/03/2018   Chronic bronchitis (HCC)    Chronic kidney disease    Coronary artery disease    Family history of adverse reaction to anesthesia    "cousin stopped breathing; he was allergic to the anesthesia" (08/02/2018)   GERD (gastroesophageal reflux disease)    High cholesterol    History of gout    History of hiatal hernia    History of kidney stones    Hypertension 07/2018   Interstitial cystitis    NSVT (nonsustained ventricular tachycardia) (HCC) 07/2018   Raynaud's disease    "feet and hands" (08/02/2018)   Raynaud's disease without gangrene 10/15/2018    Social History:  Social History   Socioeconomic  History   Marital status: Married    Spouse name: Not on file   Number of children: 3   Years of education: GED   Highest education level: Not on file  Occupational History    Employer: [employer]  Social Needs   Financial resource strain: Not on file   Food insecurity    Worry: Not on file    Inability: Not on file   Transportation needs    Medical: Not on file    Non-medical: Not on file  Tobacco Use   Smoking status: Never Smoker   Smokeless tobacco: Never Used  Substance and Sexual Activity   Alcohol use: Not Currently    Comment: 08/02/2018 "not since my 20's"   Drug use: Never   Sexual activity: Not on file  Lifestyle   Physical activity    Days per week: Not on file    Minutes per session: Not on file   Stress: Not on file  Relationships   Social Musicianconnections    Talks on phone: Not on file    Gets together: Not on file    Attends religious service: Not on file    Active member of club or organization: Not on file    Attends meetings of clubs or organizations: Not on file    Relationship status: Not on file   Intimate partner violence    Fear of current or ex partner: Not on file    Emotionally abused: Not on file    Physically abused: Not on file    Forced sexual activity: Not on file  Other Topics Concern   Not on file  Social History Narrative   Not on file    Medications:   Current Outpatient Medications on File Prior to Visit  Medication Sig Dispense Refill   aspirin EC 81 MG tablet Take 81 mg by mouth daily.     cyclobenzaprine (FLEXERIL) 10 MG tablet Take 10 mg by mouth at bedtime.     labetalol (NORMODYNE) 100 MG tablet TAKE ONE TABLET BY MOUTH TWICE A DAY (Patient taking differently: Take 50 mg by mouth 2 (two) times daily. ) 180 tablet 0   nitroGLYCERIN (NITROSTAT) 0.4 MG SL tablet Place 1 tablet (0.4 mg total) under the tongue every 5 (five) minutes as needed for chest pain. 30 tablet 2   oxybutynin  (DITROPAN) 5 MG tablet Take 2.5 mg by mouth at bedtime.   1   pantoprazole (PROTONIX) 20 MG tablet TAKE 1 TABLET BY MOUTH ONCE A DAY. (Patient taking differently: Take 20 mg by mouth daily before breakfast. ) 90 tablet 0   ranolazine (RANEXA) 500 MG 12 hr tablet Take 1 tablet (500 mg total) by mouth 2 (two) times daily. 60 tablet 3   REPATHA SURECLICK 140 MG/ML SOAJ INJECT 140 MG INTO THE SKIN EVERY 2 WEEKS. 2 mL 0   triazolam (HALCION) 0.125 MG tablet Take 0.125 mg by mouth at bedtime.     verapamil (CALAN-SR) 120 MG CR tablet TAKE 1 TABLET BY MOUTH ONCE A DAY. 30 tablet 0   Vitamin D, Ergocalciferol, (DRISDOL) 1.25 MG (50000 UT) CAPS capsule Take 50,000 Units by mouth every 7 (seven) days. Tuesdays     isosorbide mononitrate (IMDUR) 30 MG 24 hr tablet Take 1 tablet (30 mg total) by mouth 2 (two) times daily. 1/2 after waking in am and 2-3 hours later 60 tablet 3   No current facility-administered medications on file prior to visit.     Allergies:   Allergies  Allergen Reactions   Adhesive [Tape] Other (See Comments)    SKIN WILL TEAR EASILY!!!!   Statins Other (See Comments)    MYAILGIAS    Carvedilol    Diovan [Valsartan]     Tingling    Hydrochlorothiazide    Ibuprofen    Keflex [Cephalexin]     confusion   Metoclopramide Hcl     "Made me feel goofy"   Montelukast Sodium Diarrhea   Morphine And Related     hyper   Norvasc [Amlodipine Besylate]     Numbness and tingling    Nsaids     Esophagus became red and swollen (affected it)   Oxycodone Itching and Other (See Comments)    Welts, also (Tylox)   Oxycodone-Acetaminophen Itching and Other (See Comments)    Welts, also   Paracetamol [Acetaminophen] Itching    Tylox = Welts, also   Plavix [Clopidogrel Bisulfate] Other (See Comments)    Red  brusing   Prednisone     Elevated BP   Reglan [Metoclopramide]     confusion   Singulair [Montelukast] Diarrhea    Physical Exam  Today's Vitals    08/15/19 0716  BP: 125/82  Pulse: 91  Temp: (!) 97.1 F (36.2 C)  SpO2: 95%  Weight: 160 lb 3.2 oz (72.7 kg)  Height:  (1.651 m)   Body mass index is 26.66 kg/m.  General: well developed, well nourished pleasant middle aged Caucasian lady, seated, in no evident distress Head: head normocephalic and atraumatic.   Neck: supple with no carotid or supraclavicular bruits Cardiovascular: regular rate and rhythm, no murmurs Musculoskeletal: no deformity Skin:  no rash/petichiae Vascular:  Normal pulses all extremities  Neurologic Exam Mental Status: Awake and fully alert. Oriented to place and time. Recent and remote memory intact during visit. Attention span, concentration and fund of knowledge appropriate during visit but subjectively endorses difficulty. Mood and affect appropriate. Bizarre child like quality to speech with occasional word hesitancy Cranial Nerves: Pupils equal, briskly reactive to light. Extraocular movements full without nystagmus (did have difficulty performing extraocular movements where she had to close her eyes and shake her head stating she got confused on what she needed to do). Visual fields full to confrontation. Hearing intact. Facial sensation intact. Face, tongue, palate moves normally and symmetrically.  Motor: Normal bulk and tone.  Great difficulty adequately testing right-sided muscle strength due to poor effort and prominent giveaway weakness.  Full strength left upper and lower extremity Sensory.: diminished  touch , pinprick , position and vibratory sensation on right hemibody including forehead with splitting of midline.  She also endorses increased numbness/tingling on right side with light touch Coordination: Rapid alternating movements normal in all extremities. Finger-to-nose and heel-to-shin performed accurately on left side.  Exaggerated movements on right side but did not clinically fit ataxia or dysmetria Gait and Station: Arises from chair  without difficulty. Stance is normal. Gait demonstrates normal stride length and balance with slight favoring of right leg and use of cane.  Poor effort/unable to perform heel, toe and tandem walk  Reflexes: 1+ and symmetric. Toes downgoing.       ASSESSMENT: 62 year patient with speech difficulty, right sided weakness and numbness from stroke like episode of unclear etiology. MRI brain x 2 negative for stroke. 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 Exam shows nonorganic features.continues to have subjective right-sided weakness with numbness/tingling, speech difficulty and cognitive impairment.  Difficulty with exam due to poor effort and giveaway weakness.  Would get frustrated quickly with speech and would ask husband to repeat what she just said.  Highly question underlying anxiety/depression with difficulty handling daily stressors but patient denies     PLAN: -Unclear etiology of symptoms possibly related to conversion disorder versus small stroke not seen on imaging but less likely.  Referral placed to neuropsychology with possible neurocognitive evaluation -Discussion regarding potential benefit with antidepressant due to increased stress which interferes with cognition and speech.  Patient declines at this time but will discuss further with PCP -Due to lack of improvement/benefit on Depakote, recommend decreasing dosage to 250 mg daily but advised to increase back to 500 mg daily if she experiences recurrent headaches or worsening symptoms.  She states she initially experienced headaches but due to trying to get speech out correctly but endorses resolution -Continue aspirin 81 mg daily for stroke prevention -Continue to follow with cardiology for HTN and HLD management with initiation of new  cholesterol lowering agent in the near future -Currently receiving short-term disability through PCP and potentially pursuing application for ongoing disability and plans  on discussing further with PCP  She will follow up in 4 months or call earlier if needed  Greater than 50% of time during this 25 minute visit was spent on counseling,explanation of diagnosis of strokelike episode unclear etiology, reviewing risk factor management of HTN and HLD, discussion regarding her ongoing symptoms and further evaluation by neuropsych, planning of further management, discussion with patient and family and coordination of care   Frann Rider, [facility]  [facility] [address] [place], [place] [address]  Phone [phone] Fax [phone] Note: This document was prepared with digital dictation and possible smart phrase technology. Any transcriptional errors that result from this process are unintentional.

## 2019-08-15 NOTE — Progress Notes (Signed)
I agree with the above plan 

## 2019-08-17 ENCOUNTER — Encounter: Payer: Self-pay | Admitting: Psychology

## 2019-08-31 ENCOUNTER — Other Ambulatory Visit: Payer: Self-pay | Admitting: Cardiology

## 2019-09-13 ENCOUNTER — Other Ambulatory Visit: Payer: Self-pay | Admitting: Internal Medicine

## 2019-09-13 DIAGNOSIS — Z1231 Encounter for screening mammogram for malignant neoplasm of breast: Secondary | ICD-10-CM

## 2019-09-22 ENCOUNTER — Other Ambulatory Visit: Payer: Self-pay | Admitting: Cardiology

## 2019-09-25 ENCOUNTER — Telehealth: Payer: Self-pay | Admitting: Adult Health

## 2019-09-25 ENCOUNTER — Telehealth: Payer: Self-pay

## 2019-09-25 DIAGNOSIS — I25118 Atherosclerotic heart disease of native coronary artery with other forms of angina pectoris: Secondary | ICD-10-CM

## 2019-09-25 MED ORDER — DIVALPROEX SODIUM ER 250 MG PO TB24: 250.0000 mg | ORAL_TABLET | Freq: Every day | ORAL | 0 refills | Status: DC

## 2019-09-25 NOTE — Telephone Encounter (Signed)
Pt has called for a refill on her divalproex (DEPAKOTE ER) 250 MG 24 hr tablet [facility]

## 2019-09-25 NOTE — Telephone Encounter (Signed)
Pt called and states that she has been having joint pain with the repatha and she wants to know if she can be switched to something else like a pill?

## 2019-09-25 NOTE — Telephone Encounter (Signed)
She said that she had stopped it for two weeks or more and then she did another one and then when she took the shot again the pain started again

## 2019-09-25 NOTE — Addendum Note (Signed)
Addended by: Guy Begin on: 09/25/2019 11:33 AM   Modules accepted: Orders

## 2019-09-25 NOTE — Telephone Encounter (Signed)
May can change to Praluent and see if she tolerates well. Only other pill options would be Nexlizet

## 2019-09-25 NOTE — Telephone Encounter (Signed)
Before we explore other options, I would make sure that it is related to that. Is it right after her injections. Repatha has a pretty low side effect profile. She can hold her next dose and see if it is truly related and let us know.

## 2019-09-26 MED ORDER — NEXLIZET 180-10 MG PO TABS: 180.0000 mg | ORAL_TABLET | Freq: Every day | ORAL | 3 refills | Status: DC

## 2019-09-26 NOTE — Telephone Encounter (Signed)
Sent in script and she will pick It up

## 2019-09-27 NOTE — Telephone Encounter (Signed)
Error

## 2019-10-19 ENCOUNTER — Telehealth: Payer: Self-pay

## 2019-10-19 NOTE — Telephone Encounter (Signed)
No response needed: FYI only  Debbie from [facility] called and stated that patient (Tonya Boyd to their office to be seen for SOB. She told the MA that the Nexlizet that you prescribed her is the cause because she looked it up on [employer]. Patient wanted to be seen today for SOB, and I informed the MA Debbie, to let her know per you, to STOP the Nexlizet for about 2-3 days and then restart and let us know if she is still having SOB. Patient has not called [place] directly, but passing messages through [facility]. She (Debbie) has informed patient, and patient states that she will call back to PCV if the SOB continues.

## 2019-10-20 ENCOUNTER — Ambulatory Visit: Payer: BC Managed Care – PPO

## 2019-11-01 ENCOUNTER — Ambulatory Visit: Payer: Commercial Managed Care - PPO | Attending: Internal Medicine

## 2019-11-01 ENCOUNTER — Other Ambulatory Visit: Payer: Self-pay

## 2019-11-01 DIAGNOSIS — Z20822 Contact with and (suspected) exposure to covid-19: Secondary | ICD-10-CM

## 2019-11-02 LAB — NOVEL CORONAVIRUS, NAA: SARS-CoV-2, NAA: NOT DETECTED

## 2019-11-16 ENCOUNTER — Ambulatory Visit: Payer: Self-pay

## 2019-11-23 ENCOUNTER — Other Ambulatory Visit: Payer: Self-pay | Admitting: Cardiology

## 2019-11-30 ENCOUNTER — Other Ambulatory Visit: Payer: Self-pay | Admitting: Cardiology

## 2019-12-08 ENCOUNTER — Other Ambulatory Visit: Payer: Self-pay

## 2019-12-08 ENCOUNTER — Ambulatory Visit
Admission: RE | Admit: 2019-12-08 | Discharge: 2019-12-08 | Disposition: A | Discharge status: Home / Self Care | Payer: Commercial Managed Care - PPO | Source: Ambulatory Visit | Attending: Internal Medicine | Admitting: Internal Medicine

## 2019-12-08 DIAGNOSIS — Z1231 Encounter for screening mammogram for malignant neoplasm of breast: Secondary | ICD-10-CM

## 2019-12-19 ENCOUNTER — Other Ambulatory Visit: Payer: Self-pay | Admitting: Cardiology

## 2019-12-20 ENCOUNTER — Ambulatory Visit: Payer: BC Managed Care – PPO | Admitting: Adult Health

## 2019-12-20 NOTE — Progress Notes (Deleted)
[facility] [address] [place]. [place] [address] [phone]       OFFICE FOLLOW UP NOTE  [name] Date of Birth:  Sep 14, 1955 Medical Record Number:  [id number]   Referring MD: Gerlene Fee Reason for Referral: Stroke like episode   Chief Complaint:  No chief complaint on file.    HPI:   Tonya Boyd is a 64 year old female who is being seen today, 12/20/2019, for strokelike episode follow-up.  She has been stable from a neurological standpoint without new or worsening stroke/TIA symptoms.  Prior complaints of cognitive impairment, right-sided weakness and numbness.  Previously referred to Dr. Sima Matas due to subjective cognitive complaints and concern for underlying anxiety/depression but has not been evaluated at this time ***.  Continues on Depakote 250 mg daily without any recent headaches or migraines.  Continues on aspirin 81 mg daily and Repatha for stroke prevention.  Blood pressure today ***.  No further concerns at this time.      History provided for reference purposes only  Update 08/15/2019: Tonya Boyd is a 64 year old female who is being seen today for follow-up accompanied by her husband.  She continues to have speech difficulty with hesitancy and childlike quality.  She also continues to complain of cognition and memory difficulties with getting confused easily as well as subjective right-sided weakness and numbness.  She continues to ambulate with a cane and does endorse a couple falls due to tripping on her right foot due to reported weakness.  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  Currently on short-term disability by PCP and plans on pursuing disability in the near future.  She has since completed therapies with reports of improvement of prior deficits.  After prior visit, underwent EEG which was normal. She was started on Depakote 500 mg daily at prior visit due to  potential complicated migraine versus seizure activity which she continues on without difficulty.  She denies reoccurring headaches or worsening of symptoms.  She does endorse occasional depression or sadness which has been more present thinking of her daughter that passed away 5 years ago.  She also endorses increased stress/anxiety at times that can worsen her cognition and speech.  She continues on aspirin 81 mg daily without bleeding or bruising.  Previously prescribed Repatha but due to joint pain, this has been discontinued and plans on following with cardiology for initiation of a different type of cholesterol lowering agent.    Continues to follow with cardiology regularly for HTN and HLD management.  Blood pressure today 125/82.  No further concerns at this time.    Initial visit 06/15/2019 PS: Tonya Boyd is a 64 year old Caucasian lady seen today for initial office consultation visit.  She is accompanied by her husband.  History is obtained from them, review of electronic medical records and I personally reviewed imaging films in PACS.  She has past medical history of hypertension, hyperlipidemia, coronary artery disease, kidney stones, hiatal hernia, gastroesophageal reflux disease, chronic bronchitis.  She states she had episode in early September when she woke up she felt she could not think right and she had brain fog she was off balance numbness on the right side of the face and tongue as well as right hand she is went and saw her primary care physician who ordered an outpatient MRI scan of the brain done on [facility]/17/2019 [facility] which I personally reviewed showed no acute abnormalities.  Mild changes of chronic small vessel disease only noted.  She was seen in the ER on 06/08/2019 with sudden onset of speech change and right-sided weakness.  She also had some chest pain in the morning and took some nitroglycerin.  She also had a headache.  She states she does not get many headaches.  CT scan of  the head in the ER was unremarkable.  She had some subjective speech difficulties and right-sided weakness with giveaway weakness as per Dr. Alene Mires exam.  MRI scan of the brain was obtained which showed no acute abnormality and only changes of small vessel disease.  MRI of the brain showed no significant large vessel stenosis..  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   Patient states symptoms have persisted since then.  She is able to speak now but has trouble speaking and speaks in with a childlike quality to her speech.  Still has subjective weakness and numbness on the right side but is able to walk without assistance.  She states she cannot think because she has brain fog and will not be able to return to work.  She feels better.  She denies any prior known history of strokes TIAs or seizures.  She does take aspirin every day and is on medication for blood pressure and cholesterol for her coronary artery disease.  She denies prior history of migraines or headaches with neurological symptoms.  She denies any significant ongoing stress in her life.  Her daughter died 5 years ago but she states she is coping with that.  Patient feels she is unable to work because she gets confused and the brain cannot think right.  She has in fact not even been driving.       ROS:   14 system review of systems is positive for speech difficulty, memory difficulty, trouble thinking, weakness, numbness, gait difficulty, joint pain all other systems negative   PMH:  Past Medical History:  Diagnosis Date  . Angina pectoris (HCC)   . Asthma    "as a child and as an adult" (08/02/2018)  . CAD (coronary artery disease), native coronary artery 08/03/2018  . Chronic bronchitis (HCC)   . Chronic kidney disease   . Coronary artery disease   . Family history of adverse reaction to anesthesia    "cousin stopped breathing; he was allergic to the anesthesia" (08/02/2018)  . GERD  (gastroesophageal reflux disease)   . High cholesterol   . History of gout   . History of hiatal hernia   . History of kidney stones   . Hypertension 07/2018  . Interstitial cystitis   . NSVT (nonsustained ventricular tachycardia) (HCC) 07/2018  . Raynaud's disease    "feet and hands" (08/02/2018)  . Raynaud's disease without gangrene 10/15/2018    Social History:  Social History   Socioeconomic History  . Marital status: Married    Spouse name: Not on file  . Number of children: 3  . Years of education: GED  . Highest education level: Not on file  Occupational History    Employer: [employer]  Tobacco Use  . Smoking status: Never Smoker  . Smokeless tobacco: Never Used  Substance and Sexual Activity  . Alcohol use: Not Currently    Comment: 08/02/2018 "not since my 20's"  . Drug use: Never  . Sexual activity: Not on file  Other Topics Concern  . Not on file  Social History Narrative  . Not on file  Social Determinants of Health   Financial Resource Strain:   . Difficulty of Paying Living Expenses:   Food Insecurity:   . Worried About Programme researcher, broadcasting/film/videounning Out of Food in the Last Year:   . Baristaan Out of Food in the Last Year:   Transportation Needs:   . Freight forwarderLack of Transportation (Medical):   Marland Kitchen. Lack of Transportation (Non-Medical):   Physical Activity:   . Days of Exercise per Week:   . Minutes of Exercise per Session:   Stress:   . Feeling of Stress :   Social Connections:   . Frequency of Communication with Friends and Family:   . Frequency of Social Gatherings with Friends and Family:   . Attends Religious Services:   . Active Member of Clubs or Organizations:   . Attends BankerClub or Organization Meetings:   Marland Kitchen. Marital Status:   Intimate Partner Violence:   . Fear of Current or Ex-Partner:   . Emotionally Abused:   Marland Kitchen. Physically Abused:   . Sexually Abused:     Medications:   Current Outpatient Medications on File Prior to Visit  Medication Sig Dispense  Refill  . aspirin EC 81 MG tablet Take 81 mg by mouth daily.    . Bempedoic Acid-Ezetimibe (NEXLIZET) 180-10 MG TABS Take 180 mg by mouth daily. 30 tablet 3  . cyclobenzaprine (FLEXERIL) 10 MG tablet Take 10 mg by mouth at bedtime.    . divalproex (DEPAKOTE ER) 250 MG 24 hr tablet Take 1 tablet (250 mg total) by mouth daily. 90 tablet 0  . isosorbide mononitrate (IMDUR) 30 MG 24 hr tablet Take 1 tablet (30 mg total) by mouth 2 (two) times daily. 1/2 after waking in am and 2-3 hours later 60 tablet 3  . labetalol (NORMODYNE) 100 MG tablet TAKE ONE TABLET BY MOUTH TWICE A DAY 180 tablet 3  . nitroGLYCERIN (NITROSTAT) 0.4 MG SL tablet Place 1 tablet (0.4 mg total) under the tongue every 5 (five) minutes as needed for chest pain. 30 tablet 2  . oxybutynin (DITROPAN) 5 MG tablet Take 2.5 mg by mouth at bedtime.   1  . pantoprazole (PROTONIX) 20 MG tablet TAKE 1 TABLET BY MOUTH ONCE A DAY. 90 tablet 1  . ranolazine (RANEXA) 500 MG 12 hr tablet Take 1 tablet (500 mg total) by mouth 2 (two) times daily. 60 tablet 3  . REPATHA SURECLICK 140 MG/ML SOAJ INJECT 140 MG INTO THE SKIN EVERY 2 WEEKS. 2 mL 0  . triazolam (HALCION) 0.125 MG tablet Take 0.125 mg by mouth at bedtime.    . verapamil (CALAN-SR) 120 MG CR tablet TAKE 1 TABLET BY MOUTH ONCE A DAY. 90 tablet 0  . Vitamin D, Ergocalciferol, (DRISDOL) 1.25 MG (50000 UT) CAPS capsule Take 50,000 Units by mouth every 7 (seven) days. Tuesdays     No current facility-administered medications on file prior to visit.    Allergies:   Allergies  Allergen Reactions  . Adhesive [Tape] Other (See Comments)    SKIN WILL TEAR EASILY!!!!  . Statins Other (See Comments)    MYAILGIAS   . Carvedilol   . Diovan [Valsartan]     Tingling   . Hydrochlorothiazide   . Ibuprofen   . Keflex [Cephalexin]     confusion  . Metoclopramide Hcl     "Made me feel goofy"  . Montelukast Sodium Diarrhea  . Morphine And Related     hyper  . Norvasc [Amlodipine Besylate]      Numbness and tingling   .  Nsaids     Esophagus became red and swollen (affected it)  . Oxycodone Itching and Other (See Comments)    Welts, also (Tylox)  . Oxycodone-Acetaminophen Itching and Other (See Comments)    Welts, also  . Paracetamol [Acetaminophen] Itching    Tylox = Welts, also  . Plavix [Clopidogrel Bisulfate] Other (See Comments)    Red brusing  . Prednisone     Elevated BP  . Reglan [Metoclopramide]     confusion  . Singulair [Montelukast] Diarrhea    Physical Exam  There were no vitals filed for this visit. There is no height or weight on file to calculate BMI.  General: well developed, well nourished pleasant middle aged Caucasian lady, seated, in no evident distress Head: head normocephalic and atraumatic.   Neck: supple with no carotid or supraclavicular bruits Cardiovascular: regular rate and rhythm, no murmurs Musculoskeletal: no deformity Skin:  no rash/petichiae Vascular:  Normal pulses all extremities  Neurologic Exam Mental Status: Awake and fully alert. Oriented to place and time. Recent and remote memory intact during visit. Attention span, concentration and fund of knowledge appropriate during visit but subjectively endorses difficulty. Mood and affect appropriate. Bizarre child like quality to speech with occasional word hesitancy Cranial Nerves: Pupils equal, briskly reactive to light. Extraocular movements full without nystagmus (did have difficulty performing extraocular movements where she had to close her eyes and shake her head stating she got confused on what she needed to do). Visual fields full to confrontation. Hearing intact. Facial sensation intact. Face, tongue, palate moves normally and symmetrically.  Motor: Normal bulk and tone.  Great difficulty adequately testing right-sided muscle strength due to poor effort and prominent giveaway weakness.  Full strength left upper and lower extremity Sensory.: diminished  touch , pinprick , position  and vibratory sensation on right hemibody including forehead with splitting of midline.  She also endorses increased numbness/tingling on right side with light touch Coordination: Rapid alternating movements normal in all extremities. Finger-to-nose and heel-to-shin performed accurately on left side.  Exaggerated movements on right side but did not clinically fit ataxia or dysmetria Gait and Station: Arises from chair without difficulty. Stance is normal. Gait demonstrates normal stride length and balance with slight favoring of right leg and use of cane.  Poor effort/unable to perform heel, toe and tandem walk  Reflexes: 1+ and symmetric. Toes downgoing.       ASSESSMENT: 85 year patient with speech difficulty, right sided weakness and numbness from stroke like episode of unclear etiology. MRI brain x 2 negative for stroke. 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 Exam shows nonorganic features.continues to have subjective right-sided weakness with numbness/tingling, speech difficulty and cognitive impairment.  Difficulty with exam due to poor effort and giveaway weakness.  Would get frustrated quickly with speech and would ask husband to repeat what she just said.  Highly question underlying anxiety/depression with difficulty handling daily stressors but patient denies     PLAN: -Unclear etiology of symptoms possibly related to conversion disorder versus small stroke not seen on imaging but less likely.  Referral placed to neuropsychology with possible neurocognitive evaluation -Discussion regarding potential benefit with antidepressant due to increased stress which interferes with cognition and speech.  Patient declines at this time but will discuss further with PCP -Due to lack of improvement/benefit on Depakote, recommend decreasing dosage to 250 mg daily but advised to increase back to 500 mg daily if she experiences recurrent headaches or worsening symptoms.  She  states  she initially experienced headaches but due to trying to get speech out correctly but endorses resolution -Continue aspirin 81 mg daily for stroke prevention -Continue to follow with cardiology for HTN and HLD management with initiation of new cholesterol lowering agent in the near future -Currently receiving short-term disability through PCP and potentially pursuing application for ongoing disability and plans on discussing further with PCP  She will follow up in 4 months or call earlier if needed  Greater than 50% of time during this 25 minute visit was spent on counseling,explanation of diagnosis of strokelike episode unclear etiology, reviewing risk factor management of HTN and HLD, discussion regarding her ongoing symptoms and further evaluation by neuropsych, planning of further management, discussion with patient and family and coordination of care   Ihor Austin, [facility]  [facility] [address] [place], [place] [address]  Phone [phone] Fax [phone] Note: This document was prepared with digital dictation and possible smart phrase technology. Any transcriptional errors that result from this process are unintentional.

## 2019-12-21 ENCOUNTER — Other Ambulatory Visit: Payer: Self-pay | Admitting: Adult Health

## 2019-12-21 ENCOUNTER — Encounter: Payer: Self-pay | Admitting: Adult Health

## 2019-12-22 ENCOUNTER — Ambulatory Visit: Payer: BC Managed Care – PPO | Admitting: Cardiology

## 2019-12-26 ENCOUNTER — Other Ambulatory Visit: Payer: Self-pay

## 2019-12-26 ENCOUNTER — Other Ambulatory Visit: Payer: Self-pay | Admitting: Cardiology

## 2019-12-26 ENCOUNTER — Encounter: Payer: Self-pay | Admitting: Psychology

## 2019-12-26 ENCOUNTER — Encounter: Payer: Commercial Managed Care - PPO | Attending: Psychology | Admitting: Psychology

## 2019-12-26 DIAGNOSIS — R41841 Cognitive communication deficit: Secondary | ICD-10-CM | POA: Diagnosis not present

## 2019-12-26 DIAGNOSIS — I10 Essential (primary) hypertension: Secondary | ICD-10-CM | POA: Diagnosis present

## 2019-12-26 DIAGNOSIS — I69328 Other speech and language deficits following cerebral infarction: Secondary | ICD-10-CM | POA: Diagnosis present

## 2019-12-26 DIAGNOSIS — R2990 Unspecified symptoms and signs involving the nervous system: Secondary | ICD-10-CM

## 2019-12-26 DIAGNOSIS — F0150 Vascular dementia without behavioral disturbance: Secondary | ICD-10-CM | POA: Insufficient documentation

## 2019-12-26 MED ORDER — NITROGLYCERIN 0.4 MG SL SUBL: 0.4000 mg | SUBLINGUAL_TABLET | SUBLINGUAL | 2 refills | Status: DC | PRN

## 2019-12-26 NOTE — Progress Notes (Signed)
Neuropsychological Consultation   Patient:   MALIK RUFFINO   DOB:   1956-05-17  MR Number:  [id number]  Location:  [facility] FOR PAIN AND [facility] MEDICINE [facility] PHYSICAL MEDICINE AND REHABILITATION [address] [place], [id number] [facility] [place] [address] Dept: [phone]           Date of Service:   12/26/2019  Start Time:   10 AM End Time:   12 PM  Today's visit was 2 hours in duration.  The first hour was spent in a face-to-face in person clinical interview that was conducted in my outpatient clinic office with the patient, myself and her husband present for this interview.  The second hour was spent with records review and report writing.  Provider/Observer:  Arley Phenix, Psy.D.       Clinical Neuropsychologist       Billing Code/Service: Neurobehavioral status diagnostic interview  Chief Complaint:    DELICIA BERENS is a 64 year old left-handed female that was referred by Dr. Suzie Portela, NP with [facility] for neuropsychological evaluation due to significant changes in expressive language and speech, balance changes, motor functioning changes for right arm and right leg and memory changes/disturbance.  The patient has a past medical history of hypertension, hyperlipidemia, coronary artery disease, kidney stones, hiatal hernia, gastroesophageal reflux disease, chronic bronchitis.  The patient had an initial event in early September after she woke up and felt cognitive changes and "brain fog" as well as balance and numbness issues on the right side of her face and tongue and right hand.  The patient saw her PCP who ordered an outpatient MRI scan that was conducted on 05/17/2019.  The only issues identified on this MRI related to mild changes of chronic small vessel disease.  The patient describes a much more significant event that happened on 06/08/2019.  The patient experienced significant and severe changes in expressive  language, right arm and right leg motor deficits and other right-sided changes.  The patient described having chest pains and headache at this time as well and she took her nitroglycerin.  CT scan of the head in the ER was unremarkable.  MRI scan was obtained which showed no acute abnormality and only changes of small vessel disease.  There was no significant large vessel stenosis.  At the time it was felt that this was a strokelike event or possibly atypical migraine.  The patient denies ever having prior migraine history.  Current ongoing symptoms include significant changes in gait that have resulted in pain in her knee.  The patient has balance issues, changes in expressive language and difficulties with memory particularly retrieval of information.  She also describes difficulty with tracking her eyes across a written page when trying to read.  Her memory is described as having a hard time figuring out things and remembering stuff that she had recently read or heard and reports that sometimes her memory will return.  The patient has been through outpatient therapies and has had significant improvement in her speech but he continues to be quite problematic and changed.  Motor functioning has improved but she is still having significant fine motor control and strength issues.  There is a great simplicity to her speech with almost a childlike quality and reduction in articulation and some simplification and sentence structure.  Reason for Service:  BATYA CITRON is a 64 year old left-handed female that was referred by Dr. Suzie Portela, NP with [facility] for neuropsychological evaluation  due to significant changes in expressive language and speech, balance changes, motor functioning changes for right arm and right leg and memory changes/disturbance.  The patient has a past medical history of hypertension, hyperlipidemia, coronary artery disease, kidney stones, hiatal hernia,  gastroesophageal reflux disease, chronic bronchitis.  The patient had an initial event in early September after she woke up and felt cognitive changes and "brain fog" as well as balance and numbness issues on the right side of her face and tongue and right hand.  The patient saw her PCP who ordered an outpatient MRI scan that was conducted on 05/17/2019.  The only issues identified on this MRI related to mild changes of chronic small vessel disease.  The patient describes a much more significant event that happened on 06/08/2019.  The patient experienced significant and severe changes in expressive language, right arm and right leg motor deficits and other right-sided changes.  The patient described having chest pains and headache at this time as well and she took her nitroglycerin.  CT scan of the head in the ER was unremarkable.  MRI scan was obtained which showed no acute abnormality and only changes of small vessel disease.  There was no significant large vessel stenosis.  At the time it was felt that this was a strokelike event or possibly atypical migraine.  The patient denies ever having prior migraine history.  Current ongoing symptoms include significant changes in gait that have resulted in pain in her knee.  The patient has balance issues, changes in expressive language and difficulties with memory particularly retrieval of information.  She also describes difficulty with tracking her eyes across a written page when trying to read.  Her memory is described as having a hard time figuring out things and remembering stuff that she had recently read or heard and reports that sometimes her memory will return.  The patient has been through outpatient therapies and has had significant improvement in her speech but he continues to be quite problematic and changed.  Motor functioning has improved but she is still having significant fine motor control and strength issues.  There is a great simplicity to her speech  with almost a childlike quality and reduction in articulation and some simplification and sentence structure.  Ongoing cognitive complaints include these memory difficulties as well as becoming very easily confused as well as subjective reports of right-sided weakness and numbness.  The patient does use a cane for ambulating and has a couple of falls due to tripping.  The patient describes her right arm is feeling very heavy and weak.  She has had a normal EEG.  The patient was started on Depakote due to concerns of migraine/TIA/seizure activity potential.  There has not been a worsening of symptoms.  The patient had stopped taking blood thinning agents 2 weeks before her strokelike event on June 08, 2019.  She has now taking aspirin as she stopped taking the blood thinners due to side effects from these medications.  The patient is not currently driving.  The patient has had a previous hospitalization for stenting and arteries feeding her heart.  The patient has participated in rehab at [facility] for speech, PT and OT.  The patient describes some difficulties with sleep.  She reports it is hard to sleep at times and while she take sleep medications it is sometimes hard for her to "turn her mind off" and that her leg hurts which also disturbs sleep.  The patient describes appetite is  good.  She also describes some change in her sense of smell or taste.  The patient is not driving now.  The patient does describe some mild issues of depression and residual bereavement issues due to her daughter passing away 5 years ago.  She reports more anxiety and stress with the onset of her symptoms in October 2020.  She reports that at times she feels "stupid" because she cannot express what she is intending to stay adequately and has trouble remembering things.  Reliability of Information: Information is derived from 1 hour face-to-face clinical interview with the patient and her husband as well as review  of available medical records.  Behavioral Observation: Camilla Skeen Trudo  presents as a 64 y.o.-year-old Left Caucasian Female who appeared her stated age. her dress was Appropriate and she was Well Groomed and her manners were Appropriate to the situation.  her participation was indicative of Appropriate and Attentive behaviors.  There were  physical disabilities noted.  she displayed an appropriate level of cooperation and motivation.     Interactions:    Active Appropriate and Attentive  Attention:   within normal limits and attention span and concentration were age appropriate  Memory:   abnormal; remote memory intact, recent memory impaired  Visuo-spatial:  not examined  Speech (Volume):  normal  Speech:   non-fluent aphasia; articulation issues in a childlike quality to her speech and sentence construction.  This was also present in some of her handwritten portions of the initial paperwork she filled out.  Thought Process:  Coherent and Relevant  Though Content:  WNL; not suicidal and not homicidal  Orientation:   person, place, time/date and situation  Judgment:   Good  Planning:   Good  Affect:    Anxious  Mood:    Dysphoric  Insight:   Fair  Intelligence:   normal  Marital Status/Living: The patient was born in [place] along with 1 sibling.  The only issue around pregnancy was that the patient developed a kidney stone.  No major childhood illnesses noted.  The patient is married to her husband of 62 years.  She had 1 previous marriage that lasted for 2 years.  The patient had 1 adult daughter who passed away 5 years ago.  Current Employment: The patient is not currently working due to her residual difficulties and disability.  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  Substance Use:  No concerns of substance abuse are  reported.    Education:   GED  Medical History:   Past Medical History:  Diagnosis Date  . Angina pectoris ([place])   . Asthma    "as a child and as an adult" (08/02/2018)  . CAD (coronary artery disease), native coronary artery 08/03/2018  . Chronic bronchitis ([place])   . Chronic kidney disease   . Coronary artery disease   . Family history of adverse reaction to anesthesia    "cousin stopped breathing; he was allergic to the anesthesia" (08/02/2018)  . GERD (gastroesophageal reflux disease)   . High cholesterol   . History of gout   . History of hiatal hernia   . History of kidney stones   . Hypertension 07/2018  . Interstitial cystitis   . NSVT (nonsustained ventricular tachycardia) ([place]) 07/2018  . Raynaud's disease    "feet and hands" (08/02/2018)  . Raynaud's  disease without gangrene 10/15/2018            Abuse/Trauma History: While the patient denies any significant history of trauma or abuse she did struggle coping with the death of her daughter 5 years ago.  Psychiatric History:  The patient has had some mild residual bereavement/depressive symptoms with the death of her daughter.  Anxiety has also become exacerbated post apparent vascular event.  Family Med/Psych History:  Family History  Problem Relation Age of Onset  . Heart disease Other   . Arthritis Other   . Cancer Other   . Asthma Other   . Diabetes Other   . Kidney disease Other   . Kidney failure Mother   . Diabetes Mother   . Heart disease Father   . Heart attack Father     Risk of Suicide/Violence: virtually non-existent   Impression/DX:  PHENIX GREIN is a 64 year old left-handed female that was referred by Dr. Suzie Portela, NP with [facility] for neuropsychological evaluation due to significant changes in expressive language and speech, balance changes, motor functioning changes for right arm and right leg and memory changes/disturbance.  The patient has a past medical  history of hypertension, hyperlipidemia, coronary artery disease, kidney stones, hiatal hernia, gastroesophageal reflux disease, chronic bronchitis.  The patient had an initial event in early September after she woke up and felt cognitive changes and "brain fog" as well as balance and numbness issues on the right side of her face and tongue and right hand.  The patient saw her PCP who ordered an outpatient MRI scan that was conducted on 05/17/2019.  The only issues identified on this MRI related to mild changes of chronic small vessel disease.  The patient describes a much more significant event that happened on 06/08/2019.  The patient experienced significant and severe changes in expressive language, right arm and right leg motor deficits and other right-sided changes.  The patient described having chest pains and headache at this time as well and she took her nitroglycerin.  CT scan of the head in the ER was unremarkable.  MRI scan was obtained which showed no acute abnormality and only changes of small vessel disease.  There was no significant large vessel stenosis.  At the time it was felt that this was a strokelike event or possibly atypical migraine.  The patient denies ever having prior migraine history.  Current ongoing symptoms include significant changes in gait that have resulted in pain in her knee.  The patient has balance issues, changes in expressive language and difficulties with memory particularly retrieval of information.  She also describes difficulty with tracking her eyes across a written page when trying to read.  Her memory is described as having a hard time figuring out things and remembering stuff that she had recently read or heard and reports that sometimes her memory will return.  The patient has been through outpatient therapies and has had significant improvement in her speech but he continues to be quite problematic and changed.  Motor functioning has improved but she is still having  significant fine motor control and strength issues.  There is a great simplicity to her speech with almost a childlike quality and reduction in articulation and some simplification and sentence structure.   Disposition/Plan:  We have set the patient up for formal neuropsychological testing.  We will perform the Wechsler Adult Intelligence Scale-IV as well as the Wechsler Memory Scale.  We will also do expressive language testing, measures of  fine motor control including finger tapping and grooved pegboard measures and hand dynamometer.  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

## 2019-12-27 NOTE — Progress Notes (Signed)
Lab 12/22/2019: TSH normal, Hb 14.4/HCT 43.0, platelets 264  Lab 05/09/2019: Serum glucose 88, BUN 27, creatinine 1.2, EGFR 50.1/60.83.  Potassium 4.8.  Sodium 139.  CMP otherwise normal.  Total cholesterol 149, triglycerides 73, HDL 67, LDL 67, non-HDL cholesterol 82.

## 2019-12-28 NOTE — Progress Notes (Signed)
Primary Physician/Referring:  Merrilee Seashore, MD  Patient ID: Tonya Boyd, female    DOB: July 14, 1956, 64 y.o.   MRN: [id number]  Chief Complaint  Patient presents with  . Follow-up    6 month  . Chest Pain  . Coronary Artery Disease   HPI:    Tonya Boyd  is a 64 y.o. 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  She now presents here for follow-up of coronary artery disease and hyperlipidemia.  She has had 1 episode of angina for which she took nitroglycerin with complete relief.  Advised she has been doing the routine activities without dyspnea or worsening chest pain, tolerating Repatha without any complications.    She reportedly had TIA symptoms in early September with with waking up feeling brain fog, off balance,right side of her face and tongue felt numb and her right hand felt heavy. Seen by her PCP. Underwent MRI of the brain on 05/17/19 that was negative for acute CVA.      Past Medical History:  Diagnosis Date  . Angina pectoris ([place])   . Asthma    "as a child and as an adult" (08/02/2018)  . CAD (coronary artery disease), native coronary artery 08/03/2018  . Chronic bronchitis ([place])   . Chronic kidney disease   . Coronary artery disease   . Family history of adverse reaction to anesthesia    "cousin stopped breathing; he was allergic to the anesthesia" (08/02/2018)  . GERD (gastroesophageal reflux disease)   . High cholesterol   . History of gout   . History of hiatal hernia   . History of kidney stones   . Hypertension 07/2018  . Interstitial cystitis   . NSVT (nonsustained ventricular tachycardia) ([place]) 07/2018  . Raynaud's disease    "feet and hands" (08/02/2018)   . Raynaud's disease without gangrene 10/15/2018   Past Surgical History:  Procedure Laterality Date  . ABDOMINAL HYSTERECTOMY    . ANTERIOR CERVICAL DECOMP/DISCECTOMY FUSION    . APPENDECTOMY    . AUGMENTATION MAMMAPLASTY Bilateral   . BACK SURGERY    . CORONARY ANGIOPLASTY WITH STENT PLACEMENT  08/02/2018  . CORONARY STENT INTERVENTION N/A 08/02/2018   Procedure: CORONARY STENT INTERVENTION;  Surgeon: Adrian Prows, MD;  Location: [facility];  Service: Cardiovascular;  Laterality: N/A;  RCA   . CYSTOSCOPY W/ STONE MANIPULATION    . INTRAVASCULAR PRESSURE WIRE/FFR STUDY N/A 10/07/2018   Procedure: INTRAVASCULAR PRESSURE WIRE/FFR STUDY;  Surgeon: Adrian Prows, MD;  Location: [facility];  Service: Cardiovascular;  Laterality: N/A;  . KNEE ARTHROSCOPY Right   . LAPAROSCOPIC CHOLECYSTECTOMY    . LEFT HEART CATH AND CORONARY ANGIOGRAPHY N/A 10/07/2018   Procedure: LEFT HEART CATH AND CORONARY ANGIOGRAPHY;  Surgeon: Adrian Prows, MD;  Location: [facility];  Service: Cardiovascular;  Laterality: N/A;  . REPAIR ANKLE LIGAMENT Left    "tied up ligament; had tore up qthing in my ankle"  . RIGHT HEART CATH  10/07/2018  . RIGHT/LEFT HEART CATH AND CORONARY ANGIOGRAPHY N/A 08/02/2018   Procedure: RIGHT/LEFT HEART CATH AND CORONARY ANGIOGRAPHY;  Surgeon: Adrian Prows, MD;  Location: [facility];  Service: Cardiovascular;  Laterality: N/A;  . SHOULDER SURGERY  bilateral  . TONSILLECTOMY    . TUBAL LIGATION     Social History   Tobacco Use  . Smoking status: Never Smoker  . Smokeless tobacco: Never Used  Substance Use Topics  . Alcohol use: Not Currently    Comment: 08/02/2018 "not since my 20's"   Marital Status: Married  ROS  Review of Systems  Cardiovascular: Positive for chest pain. Negative for dyspnea on exertion and leg swelling.  Gastrointestinal: Negative for melena.  Psychiatric/Behavioral: The patient is nervous/anxious.    Objective  Blood pressure  135/86, pulse 82, temperature (!) 95.1 F (35.1 C), temperature source Temporal, resp. rate 16, height 5' 4" (1.626 m), weight 168 lb (76.2 kg), SpO2 95 %.  Vitals with BMI 12/29/2019 08/15/2019 06/23/2019  Height 5' 4" 5' 5" -  Weight 168 lbs 160 lbs 3 oz -  BMI 37.62 83.15 -  Systolic 176 160 737  Diastolic 86 82 91  Pulse 82 91 80     Physical Exam  Constitutional: She appears well-developed and well-nourished.  Cardiovascular: Normal rate, regular rhythm and normal heart sounds. Exam reveals no gallop.  No murmur heard. Pulmonary/Chest: Effort normal. No respiratory distress.  Abdominal: Soft. Bowel sounds are normal.   Laboratory examination:   Recent Labs    06/08/19 1043 06/08/19 1058 06/13/19 1400 06/13/19 1402 06/13/19 1430  NA 138   < > 139 136 142  K 4.5   < > 5.7* 7.3* 4.0  CL 107   < > 103 107 104  CO2 20*  --  23  --  26  GLUCOSE 76   < > 78 81 81  BUN 21   < > 18 26* 15  CREATININE 0.85   < > 1.04* 1.00 0.99  CALCIUM 8.9  --  9.5  --  9.2  GFRNONAA >60  --  58*  --  >60  GFRAA >60  --  >60  --  >60   < > = values in this interval not displayed.   CrCl cannot be calculated (Patient's most recent lab result is older than the maximum 21 days allowed.).  CMP Latest Ref Rng & Units 06/13/2019 06/13/2019 06/13/2019  Glucose 70 - 99 mg/dL 81 81 78  BUN 8 - 23 mg/dL 15 26(H) 18  Creatinine 0.44 - 1.00 mg/dL 0.99 1.00 1.04(H)  Sodium 135 - 145 mmol/L 142 136 139  Potassium 3.5 - 5.1 mmol/L 4.0 7.3(HH) 5.7(H)  Chloride 98 - 111 mmol/L 104 107 103  CO2 22 - 32 mmol/L 26 - 23  Calcium 8.9 - 10.3 mg/dL 9.2 - 9.5  Total Protein 6.5 - 8.1 g/dL - - 7.1  Total Bilirubin 0.3 - 1.2 mg/dL - - 1.3(H)  Alkaline Phos 38 - 126 U/L - - 85  AST 15 - 41 U/L - - 36  ALT 0 - 44 U/L - - 24   CBC Latest Ref Rng & Units 06/13/2019 06/13/2019 06/08/2019  WBC 4.0 - 10.5 K/uL - 6.9 -  Hemoglobin 12.0 - 15.0 g/dL 15.6(H) 15.0 13.3  Hematocrit 36.0 - 46.0 % 46.0 46.5(H) 39.0  Platelets  150 - 400 K/uL - 187 -   Lipid Panel  No results found for: CHOL, TRIG, HDL, CHOLHDL, VLDL, LDLCALC, LDLDIRECT HEMOGLOBIN A1C No results found for: HGBA1C, MPG TSH No results for input(s): TSH in the last 8760 hours.  External labs:   Lab 11/18/2019: BUN 26, creatinine 1.76, EGFR 56 mL, potassium 4.3.  CMP normal.  Total cholesterol  271, triglycerides 142, HDL 63, LDL 182.  A1c 5.2%.  Lab 12/22/2019: TSH normal, Hb 14.4/HCT 43.0, platelets 264  Lab 05/09/2019: Serum glucose 88, BUN 27, creatinine 1.2, EGFR 50.1/60.83. Potassium 4.8. Sodium 139. CMP otherwise normal.  Total cholesterol 149, triglycerides 73, HDL 67, LDL 67, non-HDL cholesterol 82.  Medications and allergies   Allergies  Allergen Reactions  . Adhesive [Tape] Other (See Comments)    SKIN WILL TEAR EASILY!!!!  . Statins Other (See Comments)    MYAILGIAS   . Carvedilol   . Diovan [Valsartan]     Tingling   . Hydrochlorothiazide   . Ibuprofen   . Keflex [Cephalexin]     confusion  . Metoclopramide Hcl     "Made me feel goofy"  . Montelukast Sodium Diarrhea  . Morphine And Related     hyper  . Norvasc [Amlodipine Besylate]     Numbness and tingling   . Nsaids     Esophagus became red and swollen (affected it)  . Oxycodone Itching and Other (See Comments)    Welts, also (Tylox)  . Oxycodone-Acetaminophen Itching and Other (See Comments)    Welts, also  . Paracetamol [Acetaminophen] Itching    Tylox = Welts, also  . Plavix [Clopidogrel Bisulfate] Other (See Comments)    Red brusing  . Prednisone     Elevated BP  . Reglan [Metoclopramide]     confusion  . Singulair [Montelukast] Diarrhea     Current Outpatient Medications  Medication Instructions  . aspirin EC 81 mg, Oral, Daily  . Bempedoic Acid-Ezetimibe (NEXLIZET) 180-10 MG TABS 180 mg, Oral, Daily  . cyclobenzaprine (FLEXERIL) 10 mg, Oral, Daily at bedtime  . divalproex (DEPAKOTE ER) 250 MG 24 hr tablet TAKE ONE TABLET BY MOUTH ONCE  DAILY.  . isosorbide mononitrate (IMDUR) 30 mg, Oral, 2 times daily, 1/2 after waking in am and 2-3 hours later  . labetalol (NORMODYNE) 100 MG tablet TAKE ONE TABLET BY MOUTH TWICE A DAY  . nitroGLYCERIN (NITROSTAT) 0.4 mg, Sublingual, Every 5 min PRN  . oxybutynin (DITROPAN) 2.5 mg, Oral, Daily at bedtime  . pantoprazole (PROTONIX) 20 MG tablet TAKE 1 TABLET BY MOUTH ONCE A DAY.  Marland Kitchen REPATHA SURECLICK 737 MG/ML SOAJ INJECT 140 MG INTO THE SKIN EVERY 2 WEEKS.  . triazolam (HALCION) 0.125 mg, Oral, Daily at bedtime  . verapamil (CALAN-SR) 120 MG CR tablet TAKE 1 TABLET BY MOUTH ONCE A DAY.  Marland Kitchen Vitamin D (Ergocalciferol) (DRISDOL) 50,000 Units, Oral, Every 7 days, Tuesdays    Radiology:   Chest X-Ray 01/09/2017: IMPRESSION: No active cardiopulmonary disease.  MR Angio Head WO Contrast 06/13/2019: IMPRESSION: 1. No evidence of ischemia on repeat diffusion imaging. 2. No abnormal enhancement following contrast. 3. Intracranial MRA is within normal limits.  EEG 07/05/19: Normal electroencephalogram, awake, asleep and with activation procedures. There are no focal lateralizing or epileptiform features.   EKG:  EKG 12/29/2019: Normal sinus rhythm with rate of 80 bpm, left axis deviation, left anterior fascicular block.  Poor R progression, cannot exclude anteroseptal infarct old.  Nonspecific abnormality, cannot exclude anterolateral ischemia.  Abnormal EKG.  Low-voltage.  No significant change from 06/13/2019.  Cardiac Studies:   Lexiscan Sestamibi stress test 07/22/2018:  1. Lexiscan stress test was performed. Exercise capacity was not assessed. Stress symptoms included chest pain, dyspnea, nausea, dizziness. Peak blood pressure was 148/98 mmHg. The resting and stress electrocardiogram demonstrated normal sinus rhythm, normal resting conduction, no resting arrhythmias and normal rest repolarization.  Stress EKG  is non diagnostic for ischemia as it is a pharmacologic stress.  2. The overall  quality of the study is good.  Left ventricular cavity is noted to be normal on the rest and stress studies.  Gated SPECT images reveal normal myocardial thickening and wall motion.  The left ventricular ejection fraction was calculated 45%, although visually appears normal. SPECT images reveal small area of mild intensity, reversible perfusion defect in apical inferolateral myocardium, that may represent ischemia.  3. Intermediate risk study due to small perfusion defect and mildly reduced LVEF. Recommend correlation with echocardiogram.  Echocardiogram 07/23/2018: Normal LV systolic function, grade 1 diastolic dysfunction, mildly calcified aortic leaflets, no significant valvular abnormality.  7 day event monitor 07/13/2018-07/19/2018: Dominant rhythm: Normal sinus rhythm.  11 patient activated events occurred with symptoms of chest pain, shortness of breath, rapid heart rate, correlating with sinus tachycardia.  Fastest tachycardia episode was day one at 1400 with 133 beats per without reported symptoms.  When autodetect event of sinus rhythm with 6 beats of NSVT on day 2 10:51 AM with symptoms of chest pain, shortness of breath.  No A. fib or SVT was noted.  Coronary angiogram 10/07/2018: No change from 08/02/2018 except D1 stenosis 50-60%, DFR negative. Normal right heart catheterization. The left ventricular systolic function is normal. LV end diastolic pressure is normal. Superdominant large RCA. Prox RCA to Mid RCA lesion is 80% stenosed S/P STENT ORSIRO DES 4.0X30 with 0% residual stenosis. Ost RPDA lesion is 50% stenosed. Small LAD ends before reaching apex.   Assessment     ICD-10-CM   1. Coronary artery disease of native artery of native heart with stable angina pectoris ([place])  I25.118 EKG 12-Lead  2. Essential hypertension  I10   3. Pure hypercholesterolemia  E78.00 Lipid Panel With LDL/HDL Ratio    No orders of the defined types were placed in this encounter.   Medications  Discontinued During This Encounter  Medication Reason  . ranolazine (RANEXA) 500 MG 12 hr tablet Ineffective     Recommendations:   CHENEY GOSCH  is a 64 y.o. 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  She now presents here for follow-up of coronary artery disease and hyperlipidemia.   From cardiac standpoint she has had stable angina, no significant change in EKG, I reviewed her external labs, since discontinuing statins LDL has markedly increased.  She is now on Repatha.  I will repeat lipid profile testing.  Otherwise blood pressures well controlled, no clinical evidence heart failure, I will see her back in 6 months.  Adrian Prows, MD, [facility] 12/29/2019, 2:55 PM [facility]. [place] Office: [phone]

## 2019-12-29 ENCOUNTER — Ambulatory Visit: Payer: Commercial Managed Care - PPO | Admitting: Cardiology

## 2019-12-29 ENCOUNTER — Other Ambulatory Visit: Payer: Self-pay

## 2019-12-29 ENCOUNTER — Encounter: Payer: Self-pay | Admitting: Cardiology

## 2019-12-29 VITALS — BP 135/86 | HR 82 | Temp 95.1°F | Resp 16 | Ht 64.0 in | Wt 168.0 lb

## 2019-12-29 DIAGNOSIS — I10 Essential (primary) hypertension: Secondary | ICD-10-CM

## 2019-12-29 DIAGNOSIS — I25118 Atherosclerotic heart disease of native coronary artery with other forms of angina pectoris: Secondary | ICD-10-CM

## 2019-12-29 DIAGNOSIS — E7800 Pure hypercholesterolemia, unspecified: Secondary | ICD-10-CM

## 2019-12-29 DIAGNOSIS — R471 Dysarthria and anarthria: Secondary | ICD-10-CM

## 2020-01-01 ENCOUNTER — Encounter (HOSPITAL_BASED_OUTPATIENT_CLINIC_OR_DEPARTMENT_OTHER): Payer: Commercial Managed Care - PPO | Admitting: Psychology

## 2020-01-01 ENCOUNTER — Encounter: Payer: Self-pay | Admitting: Psychology

## 2020-01-01 ENCOUNTER — Other Ambulatory Visit: Payer: Self-pay

## 2020-01-01 ENCOUNTER — Encounter: Payer: Self-pay | Admitting: Adult Health

## 2020-01-01 ENCOUNTER — Telehealth (INDEPENDENT_AMBULATORY_CARE_PROVIDER_SITE_OTHER): Payer: Self-pay | Admitting: Adult Health

## 2020-01-01 DIAGNOSIS — R2990 Unspecified symptoms and signs involving the nervous system: Secondary | ICD-10-CM

## 2020-01-01 DIAGNOSIS — R499 Unspecified voice and resonance disorder: Secondary | ICD-10-CM

## 2020-01-01 DIAGNOSIS — E785 Hyperlipidemia, unspecified: Secondary | ICD-10-CM

## 2020-01-01 DIAGNOSIS — I10 Essential (primary) hypertension: Secondary | ICD-10-CM

## 2020-01-01 NOTE — Addendum Note (Signed)
Addended by: Thayer Headings R on: 01/01/2020 04:46 PM   Modules accepted: Level of Service

## 2020-01-01 NOTE — Progress Notes (Addendum)
The patient arrived on time to her 13:00 testing appointment, which lasted 240 minutes. Mental Status & Behavioral Observations:   Appearance: Casually dressed and appropriately groomed. Gait: walked with a cane for assistance. Notable right sided weakness.             Speech: Dysarthria noted with normal rate, tone and volume.                                   Thought process: Linear, mostly organized, and inattentive. Mild perseveration noted.      Mood/Affect:  Anxious and depressed, congruent with mood and appropriate.                                 Interpersonal: Pleasant, appropriate                               Orientation: O x 4 She was cooperative with all assigned tasks and appeared to give good effort. She did not have much difficulty understanding test instructions but required some additional prompting including repeated questioning. She exhibited adequate frustration tolerance on questions she did not know or tasks that were more difficult.  Tests Administered:  Animal Naming    [employer], 3rd Edition, Standard Form (CVLT-III)  Controlled Oral Word Association Test (COWAT)   Finger Tapping Test (FTT)  Grooved Pegboard  Hand Dynamometer   Wechsler Adult Intelligence Scale-Fourth Edition (WAIS-IV)   Wechsler Memory Scale-Fourth Edition (WMS-IV) Adult Battery, select subtests Results:  COWAT . FAS Total=18, T=31, 3% . Animals Total= 16, T=42, 21% Finger Tapping Test (average of 5 trials) . Dominant Hand (Lt.)  o Taps=33, T=37, 9% . Non-Dominant Hand o Taps=15, T=20, <1%  Grooved Pegboard . Dominant Hand (Lt.)  o Time=107", T=31, 3% o Drops=0 . Non-Dominant Hand o Time=206", T=16, <1% o Drops=6 Hand Dynamometer  . Dominant Hand (Lt.)  o 22-23 Kg, Z=0.0, 50% . Non-Dominant Hand o 7-8 Kg, Z < -1.0, <16th  WAIS-IV Composite Score Summary  Scale Sum of Scaled Scores Composite Score Percentile Rank 95% Conf. Interval Qualitative  Description  Verbal Comprehension 22 VCI 85 16 80-91 Low Average  Perceptual Reasoning 16 PRI 73 4 68-81 Borderline  Working Memory 12 WMI 77 6 72-85 Borderline  Processing Speed 12 PSI 79 8 73-89 Borderline  Full Scale 62 FSIQ 74 4 70-79 Borderline  General Ability 38 GAI 77 6 73-83 Borderline   Index Level Discrepancy Comparisons  Comparison Score 1 Score 2 Difference Critical Value .05 Significant Difference Y/N Base Rate by Overall Sample  VCI - PRI 85 73 12 8.31 Y 19.3  VCI - WMI 85 77 8 8.82 N 27.1  VCI - PSI 85 79 6 10.19 N 36.3  PRI - WMI 73 77 -4 9.74 N 39.5  PRI - PSI 73 79 -6 11.00 N 36.0  WMI - PSI 77 79 -2 11.38 N 46.5  FSIQ - GAI 74 77 -3 3.51 N 30.3   Differences Between Subtest and Overall Mean of Subtest Scores  Subtest Subtest Scaled Score Mean Scaled Score Difference Critical Value .05 Strength or Weakness Base Rate  Block Design 5 6.20 -1.20 2.85  >25%  Similarities 8 6.20 1.80 2.82  >25%  Digit Span 5 6.20 -1.20 2.22  >25%  Matrix Reasoning  5 6.20 -1.20 2.54  >25%  Vocabulary 8 6.20 1.80 2.03  >25%  Arithmetic 7 6.20 0.80 2.73  >25%  Symbol Search 5 6.20 -1.20 3.42  >25%  Visual Puzzles 6 6.20 -0.20 2.71  >25%  Information 6 6.20 -0.20 2.19  >25%  Coding 7 6.20 0.80 2.97  >25%   WMS-IV (Adult Battery) Brief Cognitive Status Exam Classification  Age Years of Education Raw Score Classification Level Base Rate  63 years 4 months 12 36 Low 3.5   Index Score Summary  Index Sum of Scaled Scores Index Score Percentile Rank 95% Confidence Interval Qualitative Descriptor  Visual Working Memory (VWMI) 12 77 6 71-86 Borderline   Primary Subtest Scaled Score Summary  Subtest Domain Raw Score Scaled Score Percentile Rank  Logical Memory I AM 15 5 5   Logical Memory II AM 8 4 2   Visual Reproduction I VM 20 3 1   Visual Reproduction II VM 14 7 16   Spatial Addition VWM 9 8 25   Symbol Span VWM 8 4 2    PROCESS SCORE CONVERSIONS Auditory Memory Process Score  Summary  Process Score Raw Score Scaled Score Percentile Rank Cumulative Percentage (Base Rate)  LM II Recognition 21 - - 10-16%   Visual Memory Process Score Summary  Process Score Raw Score Scaled Score Percentile Rank Cumulative Percentage (Base Rate)  VR II Recognition 1 - - <=2%   CVLT-III Standard Form Demographically Adjusted Standard Score Summary Index Index score Demographically adjusted score    Trials 1-5 Correct 82 37    Delayed Recall Correct 80 34    Total Recall Correct 81 46      Demographically Adjusted Core Scores Summary    Immediate Recall Score Scaled score Demographically adjusted score    Trial 1 Correct 10 50    Trial 2 Correct 7 40    Trial 3 Correct 6 37    Trial 4 Correct 6 35    Trial 5 Correct 6 35    List B Correct 9 47      Delayed Recall Score Scaled score Demographically adjusted score    Short Delay Free Recall Correct 7 40    Short Delay Cued Recall Correct 6 34    Long Delay Free Recall Correct 6 35    Long Delay Cued Recall Correct 6 34      Yes/No Recognition Score Scaled score Demographically adjusted score    Total Hits 4 29    Total False Positives 10 49    Recognition Discriminability (d') 5 35      Recall Errors Score Scaled score Demographically adjusted score    Total Intrusions 11 53      Standard Score Score Standard score Demographically adjusted score    Total Recall Responses 79 35

## 2020-01-01 NOTE — Progress Notes (Signed)
[facility] [address] [place]. [place] [address] [phone]       VIRTUAL VISIT FOLLOW UP NOTE  [name] Date of Birth:  1956/05/24 Medical Record Number:  [id number]   Referring MD: Kennis Carina Reason for Referral: Stroke like episode   Virtual Visit via Video Note  I connected with Tonya Boyd on 01/01/20 at 10:45 AM EDT by a video enabled telemedicine application located at [facility] and verified that I am speaking with the correct person using two identifiers who was located at their own home.   Visit scheduled by Jeneen Rinks, administrative assistant, who discussed the limitations of evaluation and management by telemedicine and the availability of in person appointments. The patient expressed understanding and agreed to proceed.     HPI:   Today, 01/01/2020, Tonya Boyd is is being seen via virtual visit for follow-up regarding prior strokelike episode in 06/2019.  Residual symptoms of right sided weakness, speech impairment with childlike quality and cognitive deficit which have been stable without worsening. Evaluated by Dr. Kieth Brightly on 12/26/2019 and plans on undergoing neurocognitive evaluation today.  Continues on Depakote 250 mg daily for ongoing migraine prophylaxis with benefit.  Previously on 500 mg dosage and denies any worsening with lowering dose.  Mild occasional headaches but denies migrainous headaches.  Continues on aspirin 81 mg daily and Repatha for stroke prevention.  Continues to follow with Dr. Jacinto Halim, cardiology, routinely.  No concerns at this time.       History provided for reference purposes only  Update 08/15/2019: Tonya Boyd is a 64 year old female who is being seen today for follow-up accompanied by her husband.  She continues to have speech difficulty with hesitancy and childlike quality.  She also continues to complain of cognition and memory difficulties with getting confused easily as  well as subjective right-sided weakness and numbness.  She continues to ambulate with a cane and does endorse a couple falls due to tripping on her right foot due to reported weakness.  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  Currently on short-term disability by PCP and plans on pursuing disability in the near future.  She has since completed therapies with reports of improvement of prior deficits.  After prior visit, underwent EEG which was normal. She was started on Depakote 500 mg daily at prior visit due to potential complicated migraine versus seizure activity which she continues on without difficulty.  She denies reoccurring headaches or worsening of symptoms.  She does endorse occasional depression or sadness which has been more present thinking of her daughter that passed away 5 years ago.  She also endorses increased stress/anxiety at times that can worsen her cognition and speech.  She continues on aspirin 81 mg daily without bleeding or bruising.  Previously prescribed Repatha but due to joint pain, this has been discontinued and plans on following with cardiology for initiation of a different type of cholesterol lowering agent.    Continues to follow with cardiology regularly for HTN and HLD management.  Blood pressure today 125/82.  No further concerns at this time.    Initial visit 06/15/2019 Dr. Pearlean Brownie: Tonya Boyd is a 64 year old Caucasian lady seen today for initial office consultation visit.  She is accompanied by her husband.  History is obtained from them, review of electronic medical records and I personally reviewed imaging films in PACS.  She has past medical history of  hypertension, hyperlipidemia, coronary artery disease, kidney stones, hiatal hernia, gastroesophageal reflux disease, chronic bronchitis.  She states she had episode in early September when she woke up she felt she could not think right and she had brain fog she  was off balance numbness on the right side of the face and tongue as well as right hand she is went and saw her primary care physician who ordered an outpatient MRI scan of the brain done on 05/17/2019 [facility] which I personally reviewed showed no acute abnormalities.  Mild changes of chronic small vessel disease only noted.  She was seen in the ER on 06/08/2019 with sudden onset of speech change and right-sided weakness.  She also had some chest pain in the morning and took some nitroglycerin.  She also had a headache.  She states she does not get many headaches.  CT scan of the head in the ER was unremarkable.  She had some subjective speech difficulties and right-sided weakness with giveaway weakness as per Dr. Alene Mires exam.  MRI scan of the brain was obtained which showed no acute abnormality and only changes of small vessel disease.  MRI of the brain showed no significant large vessel stenosis..  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   Patient states symptoms have persisted since then.  She is able to speak now but has trouble speaking and speaks in with a childlike quality to her speech.  Still has subjective weakness and numbness on the right side but is able to walk without assistance.  She states she cannot think because she has brain fog and will not be able to return to work.  She feels better.  She denies any prior known history of strokes TIAs or seizures.  She does take aspirin every day and is on medication for blood pressure and cholesterol for her coronary artery disease.  She denies prior history of migraines or headaches with neurological symptoms.  She denies any significant ongoing stress in her life.  Her daughter died 5 years ago but she states she is coping with that.  Patient feels she is unable to work because she gets confused and the brain cannot think right.  She has in fact not even been driving.       ROS:   14 system review of  systems is positive for speech difficulty, memory loss and weakness, all other systems negative   PMH:  Past Medical History:  Diagnosis Date  . Angina pectoris (HCC)   . Asthma    "as a child and as an adult" (08/02/2018)  . CAD (coronary artery disease), native coronary artery 08/03/2018  . Chronic bronchitis (HCC)   . Chronic kidney disease   . Coronary artery disease   . Family history of adverse reaction to anesthesia    "cousin stopped breathing; he was allergic to the anesthesia" (08/02/2018)  . GERD (gastroesophageal reflux disease)   . High cholesterol   . History of gout   . History of hiatal hernia   . History of kidney stones   . Hypertension 07/2018  . Interstitial cystitis   . NSVT (nonsustained ventricular tachycardia) (HCC) 07/2018  . Raynaud's disease    "feet and hands" (08/02/2018)  . Raynaud's disease without gangrene 10/15/2018    Social History:  Social History   Socioeconomic History  . Marital status: Married    Spouse name: Not on file  . Number of children: 3  . Years of education: GED  .  Highest education level: Not on file  Occupational History    Employer: [employer]  Tobacco Use  . Smoking status: Never Smoker  . Smokeless tobacco: Never Used  Substance and Sexual Activity  . Alcohol use: Not Currently    Comment: 08/02/2018 "not since my 20's"  . Drug use: Never  . Sexual activity: Not Currently  Other Topics Concern  . Not on file  Social History Narrative  . Not on file   Social Determinants of Health   Financial Resource Strain:   . Difficulty of Paying Living Expenses:   Food Insecurity:   . Worried About Charity fundraiser in the Last Year:   . Arboriculturist in the Last Year:   Transportation Needs:   . Film/video editor (Medical):   Marland Kitchen Lack of Transportation (Non-Medical):   Physical Activity:   . Days of Exercise per Week:   . Minutes of Exercise per Session:   Stress:   . Feeling of Stress  :   Social Connections:   . Frequency of Communication with Friends and Family:   . Frequency of Social Gatherings with Friends and Family:   . Attends Religious Services:   . Active Member of Clubs or Organizations:   . Attends Archivist Meetings:   Marland Kitchen Marital Status:   Intimate Partner Violence:   . Fear of Current or Ex-Partner:   . Emotionally Abused:   Marland Kitchen Physically Abused:   . Sexually Abused:     Medications:   Current Outpatient Medications on File Prior to Visit  Medication Sig Dispense Refill  . aspirin EC 81 MG tablet Take 81 mg by mouth daily.    . Bempedoic Acid-Ezetimibe (NEXLIZET) 180-10 MG TABS Take 180 mg by mouth daily. 30 tablet 3  . cyclobenzaprine (FLEXERIL) 10 MG tablet Take 10 mg by mouth at bedtime.    . divalproex (DEPAKOTE ER) 250 MG 24 hr tablet TAKE ONE TABLET BY MOUTH ONCE DAILY. 90 tablet 0  . isosorbide mononitrate (IMDUR) 30 MG 24 hr tablet Take 1 tablet (30 mg total) by mouth 2 (two) times daily. 1/2 after waking in am and 2-3 hours later 60 tablet 3  . labetalol (NORMODYNE) 100 MG tablet TAKE ONE TABLET BY MOUTH TWICE A DAY 180 tablet 3  . nitroGLYCERIN (NITROSTAT) 0.4 MG SL tablet Place 1 tablet (0.4 mg total) under the tongue every 5 (five) minutes as needed for chest pain. 30 tablet 2  . oxybutynin (DITROPAN) 5 MG tablet Take 2.5 mg by mouth at bedtime.   1  . pantoprazole (PROTONIX) 20 MG tablet TAKE 1 TABLET BY MOUTH ONCE A DAY. 90 tablet 1  . REPATHA SURECLICK 606 MG/ML SOAJ INJECT 140 MG INTO THE SKIN EVERY 2 WEEKS. 2 mL 0  . triazolam (HALCION) 0.125 MG tablet Take 0.125 mg by mouth at bedtime.    . verapamil (CALAN-SR) 120 MG CR tablet TAKE 1 TABLET BY MOUTH ONCE A DAY. 90 tablet 0  . Vitamin D, Ergocalciferol, (DRISDOL) 1.25 MG (50000 UT) CAPS capsule Take 50,000 Units by mouth every 7 (seven) days. Tuesdays     No current facility-administered medications on file prior to visit.    Allergies:   Allergies  Allergen Reactions    . Adhesive [Tape] Other (See Comments)    SKIN WILL TEAR EASILY!!!!  . Statins Other (See Comments)    MYAILGIAS   . Carvedilol   . Diovan [Valsartan]     Tingling   .  Hydrochlorothiazide   . Ibuprofen   . Keflex [Cephalexin]     confusion  . Metoclopramide Hcl     "Made me feel goofy"  . Montelukast Sodium Diarrhea  . Morphine And Related     hyper  . Norvasc [Amlodipine Besylate]     Numbness and tingling   . Nsaids     Esophagus became red and swollen (affected it)  . Oxycodone Itching and Other (See Comments)    Welts, also (Tylox)  . Oxycodone-Acetaminophen Itching and Other (See Comments)    Welts, also  . Paracetamol [Acetaminophen] Itching    Tylox = Welts, also  . Plavix [Clopidogrel Bisulfate] Other (See Comments)    Red brusing  . Prednisone     Elevated BP  . Reglan [Metoclopramide]     confusion  . Singulair [Montelukast] Diarrhea    Physical Exam *Limited due to visit type  General: well developed, well nourished, pleasant middle-age Caucasian female, seated, in no evident distress Head: head normocephalic and atraumatic.    Neurologic Exam Mental Status: Awake and fully alert.  Childlike quality of speech -unable to appreciate dysarthria or aphasia.  Oriented to place and time. Recent memory impaired and remote memory intact. Attention span, concentration and fund of knowledge appropriate during visit. Mood and affect appropriate.  Cranial Nerves: Extraocular movements full without nystagmus. Hearing intact to voice. Facial sensation intact. Face, tongue, palate moves normally and symmetrically.  Shoulder shrug symmetric. Motor: No evidence of weakness per drift assessment Coordination: Rapid alternating movements normal in all extremities. Finger-to-nose and heel-to-shin performed accurately bilaterally with exaggerated movements on right but not clinically correlated with ataxia or dysmetria. Gait and Station: Arises from chair without difficulty.  Stance is normal. Gait demonstrates normal stride length and balance without assistive device within her home.  Reflexes: UTA       ASSESSMENT: 63 year patient with speech difficulty, right sided weakness and numbness from stroke like episode of unclear etiology. MRI brain x 2 negative for stroke. 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 Exam shows nonorganic features.vascular risk factors of CAD s/p angioplasty to right RCA 07/2018, HTN and HLD with statin intolerance.  Continues to have subjective right-sided weakness with numbness/tingling, speech difficulty and cognitive impairment which have been stable without worsening.      PLAN: -Undergo neurocognitive evaluation today with Dr. Kieth Brightly for further evaluation of cognitive concerns and suspicion for underlying depression/anxiety contributing -Continue Depakote 250 mg daily for migraine prophylaxis -May consider discontinuing in future -Continue aspirin 81 mg daily and Repatha for stroke prevention -Continue to follow with cardiology routinely for chronic disease management and monitoring -Continue to follow with PCP for disability assistance  She will follow up in 4 months or call earlier if needed   I spent 22 minutes of non-face-to-face time with patient.  This included previsit chart review, lab review, study review, order entry, electronic health record documentation, patient education   Ihor Austin, [facility]  [facility] [address] [place], [place] [address]  Phone [phone] Fax [phone] Note: This document was prepared with digital dictation and possible smart phrase technology. Any transcriptional errors that result from this process are unintentional.

## 2020-01-03 ENCOUNTER — Encounter (HOSPITAL_BASED_OUTPATIENT_CLINIC_OR_DEPARTMENT_OTHER): Payer: Commercial Managed Care - PPO | Admitting: Psychology

## 2020-01-03 ENCOUNTER — Other Ambulatory Visit: Payer: Self-pay

## 2020-01-03 ENCOUNTER — Encounter: Payer: Self-pay | Admitting: Psychology

## 2020-01-03 DIAGNOSIS — F0150 Vascular dementia without behavioral disturbance: Secondary | ICD-10-CM | POA: Diagnosis not present

## 2020-01-03 DIAGNOSIS — R2990 Unspecified symptoms and signs involving the nervous system: Secondary | ICD-10-CM | POA: Diagnosis not present

## 2020-01-03 DIAGNOSIS — F01A0 Vascular dementia, mild, without behavioral disturbance, psychotic disturbance, mood disturbance, and anxiety: Secondary | ICD-10-CM

## 2020-01-04 ENCOUNTER — Telehealth: Payer: Self-pay

## 2020-01-04 NOTE — Progress Notes (Addendum)
Neuropsychological Evaluation     Patient:                       Tonya Boyd             DOB:                           01-26-1956   MR Number:               [id number]   Location:                    [facility]ICINE AND REHABILITATION [address] [place], [address] [id number] [facility] [place] [address]  Dept: [phone]                                                                                                              Start Time:                             2 PM End Time:                               3 PM  Chief Complaint:    Chief Complaint  Patient presents with    Memory Loss    Speech and language changes  Right sided motor weakness   Migraine   Provider/Observer:              Arley Phenix, Psy.D.                                                   Clinical Neuropsychologist  Reason for Service:              Tonya Boyd is a 64 year old left-handed female that was referred by Dr. Suzie Portela, NP with [facility] for neuropsychological evaluation due to significant changes in expressive language and speech, balance changes, motor functioning changes for right arm and right leg and memory changes/disturbance.  The patient has a past medical history of hypertension, hyperlipidemia, coronary artery disease, kidney stones, hiatal hernia, gastroesophageal reflux disease, chronic bronchitis.  The patient had an initial event in early September after she woke up and felt cognitive changes and "brain fog" as well as balance and numbness issues on the right side of her face and tongue and right hand.  The patient saw her PCP who ordered an outpatient MRI scan that was conducted on 05/17/2019.  The only issues identified on this MRI related to mild changes of chronic small vessel disease.  The patient describes a much more significant event that happened on 06/08/2019.  The  patient experienced significant and severe changes  in expressive language, right arm and right leg motor deficits and other right-sided changes.  The patient described having chest pains and headache at this time as well and she took her nitroglycerin.  CT scan of the head in the ER was unremarkable.  MRI scan was obtained which showed no acute abnormality and only changes of small vessel disease.  There was no significant large vessel stenosis.  At the time it was felt that this was a stroke like event or possibly atypical migraine.  The patient denies ever having prior migraine history.  Current ongoing symptoms include significant changes in gait that have resulted in pain in her knee.  The patient has balance issues, changes in expressive language and difficulties with memory particularly retrieval of information.  She also describes difficulty with tracking her eyes across a written page when trying to read.  Her memory is described as having a hard time figuring out things and remembering stuff that she had recently read or heard and reports that sometimes her memory will return.  The patient has been through outpatient therapies and has had significant improvement in her speech but he continues to be quite problematic and changed.  Motor functioning has improved but she is still having significant fine motor control and strength issues.  There is a great simplicity to her speech with almost a childlike quality and reduction in articulation and some simplification and sentence structure.   Ongoing cognitive complaints include these memory difficulties as well as becoming very easily confused as well as subjective reports of right-sided weakness and numbness.  The patient does use a cane for ambulating and has a couple of falls due to tripping.  The patient describes her right arm is feeling very heavy and weak.  She has had a normal EEG.  The patient was started on Depakote due to concerns of  migraine/TIA/seizure activity potential.  There has not been a worsening of symptoms.   The patient had stopped taking blood thinning agents 2 weeks before her stroke like event on June 08, 2019.  She has now taking aspirin as she stopped taking the blood thinners due to side effects from these medications.  The patient is not currently driving.  The patient has had a previous hospitalization for stenting and arteries feeding her heart.  The patient has participated in rehab at [facility] for speech, PT and OT.   The patient describes some difficulties with sleep.  She reports it is hard to sleep at times and while she takes sleep medications it is sometimes hard for her to "turn her mind off" and that her leg hurts which also disturbs sleep.  The patient describes appetite is good.  She also describes some change in her sense of smell or taste.  The patient is not driving now.   The patient does describe some mild issues of depression and residual bereavement issues due to her daughter passing away 5 years ago.  She reports more anxiety and stress with the onset of her symptoms in October 2020.  She reports that at times she feels "stupid" because she cannot express what she is intending to stay adequately and has trouble remembering things.  Mental Status & Behavioral Observations:  Appearance: Casually and appropriately dressed with adequate hygiene.    Gait: Ambulated independently w/ cane for assistance. Slow pace w/poor balance. Right sided weakness.            Speech: Dysarthria noted with normal rate, tone &  volume. Childlike quality to her speech and Freight forwardersentence construction.               Thought process:  Linear, mostly organized and inattentive. Perseverative at times.  Mood/Affect:  Anxious and depressed with congruent affect.      Interpersonal: Polite and appropriate.          Orientation: Oriented x 4          Effort/Motivation: Good   She was cooperative with all assigned  tasks and appeared to give good effort. She did not have much difficulty understanding test instructions but required some additional prompting including repeated questioning. She exhibited adequate frustration tolerance on questions she did not know or tasks that were more difficult.  Tests Administered: . Animal Naming   . [place] Verbal Learning Test, 3rd Edition, Standard Form (CVLT-III) . Controlled Oral Word Association Test (COWAT)  . Finger Tapping Test (FTT) . Grooved Pegboard . Hand Dynamometer  . Wechsler Adult Intelligence Scale-Fourth Edition (WAIS-IV) . Wechsler Memory Scale-Fourth Edition (WMS-IV) Adult Battery, select subtests   TEST RESULTS AND INTERPRETATION  ADEQUACY OF EFFORT:  Behaviorally, it appeared the patient was putting forth adequate effort on cognitive measures and persisted when challenged. Performance on all validity indicators imbedded in tests of auditory attention and verbal memory (WAIS-IV Digit Span: Reliable Digit Span; CVLT-III Forced Choice Recognition) was within normal limits. Overall, the current results were likely a valid estimate of her abilities.   PREMORBID INTELLECTUAL ABILITY:  Given her reported developmental and educational history, the patient's premorbid intellectual ability was estimated as low average to average. It is in that context that neuropsychological test results were interpreted.  WAIS-IV  Composite Score Summary  Scale Sum of Scaled Scores Composite Score Percentile Rank 95% Conf. Interval Qualitative Description  Verbal Comprehension 22 VCI 85 16 80-91 Low Average  Perceptual Reasoning 16 PRI 73 4 68-81 Borderline  Working Memory 12 WMI 77 6 72-85 Borderline  Processing Speed 12 PSI 79 8 73-89 Borderline  Full Scale 62 FSIQ 74 4 70-79 Borderline  General Ability 38 GAI 77 6 73-83 Borderline   Differences Between Subtest and Overall Mean of Subtest Scores  Subtest Subtest Scaled Score Mean Scaled Score Difference  Critical Value .05 Strength or Weakness Base Rate  Block Design 5 6.20 -1.20 2.85  >25%  Similarities 8 6.20 1.80 2.82  >25%  Digit Span 5 6.20 -1.20 2.22  >25%  Matrix Reasoning 5 6.20 -1.20 2.54  >25%  Vocabulary 8 6.20 1.80 2.03  >25%  Arithmetic 7 6.20 0.80 2.73  >25%  Symbol Search 5 6.20 -1.20 3.42  >25%  Visual Puzzles 6 6.20 -0.20 2.71  >25%  Information 6 6.20 -0.20 2.19  >25%  Coding 7 6.20 0.80 2.97  >25%   CURRENT COGNITIVE FUNCTIONING: While utilizing full-scale IQ score and the other global composite score of general abilities index score for predicting historical functioning is quite limited as the patient is describing some significant decrease in overall cognitive functioning and therefore the patient's full-scale IQ scores likely been impacted and should not be seen as a consistent measure with lifelong functioning.  The patient produced a full-scale IQ score of 74 which falls at the 4th percentile and is in the Borderline range.  This is significantly below predicted levels based on the patient's educational and occupational history and does suggest 1 or more domains are currently being adversely impacted.  The patient produced a similar score on the general abilities index (GAI=77). This  measure places less emphasis on the most acutely impacted measures and this comprehensive battery of cognitive measures; less emphasis on working memory and information processing speed variables.  The patient produced a verbal comprehension index score of 85 which falls at the 16th percentile and is in the Low Average range.  This is slightly below expectation but generally within predicted levels and indicates that she is maintaining and preserving overall verbal comprehensive skills.  Individual subtests making up this measure shows that the patient is doing relatively well on measures of vocabulary and abstract verbal reasoning. General fund of information is relatively weak but consistent  with reported educational history (e.g., dropped out of high school in 10th grade and earned GED).   The patient produced a perceptual reasoning index score of 73 which falls at the 4th percentile.  This performance falls in the Borderline range relative to a normative population.  This pattern suggest visual spatial and visual constructional abilities are significantly weaker compared to her verbal comprehension skills and predicted levels of intellectual functioning.  The patient produced a working memory index score of 77 which falls at the 6th percentile and is in the Borderline range of functioning.  This is below predicted levels and suggests that issues related to auditory encoding are mildly impaired and below historical functioning for the patient.  However, the patient displayed some variability in this domain as she performed in the Low Average range on a timed measure of basic arithmetic (WAIS-IV Arithmetic=16th percentile) while her performance on a pure auditory encoding test fell in the Borderline range (WAIS-IV Digit Span=5th percentile).   The patient produced a processing speed index score of 79 which falls at the 8th percentile and is also in the Borderline range.  This is slightly below expectation and somewhat inconsistent with predicted levels. Individual subtests making up this domain do suggest a clinically meaningful decline in overall information processing speed, with mild- moderate difficulties related to visual scanning and visual searching abilities.   The patient's auditory and visual encoding measures derived from both the WAIS-IV and WMS-IV (adult Battery) are variable and relatively weak compared to predicted levels.  WMS-IV (Adult Battery) Primary Subtest Scaled Score Summary  Subtest Domain Raw Score Scaled Score Percentile Rank  Logical Memory I AM Logical Memory II AM Visual Reproduction I VM Visual Reproduction II VM Spatial  Addition VWM Symbol Span VWM CVLT-III Standard Form         Demographically Adjusted Standard Score Summary Index Index score Demographically adjusted score    Trials 1-5 Correct 82 37    Delayed Recall Correct 80 34    Total Recall Correct 81 36      LEARNING & MEMORY:  The patient's performance on a task that required her to learn a list of words over 5 learning trials (CVLT-III Trials 1-5) was Low Average (12th percentile) compared to her immediate recall for two paragraph-length stories (WMS-IV Logical Memory-I), which fell in the Borderline range compared to age and education matched peers.   The patient's ability to retain information over time as measured by the CVLT-III Delayed Recall Correct Index score of 81 also placed her performance in the Low Average range for her age and education (10th percentile). Her score on a recognition version of the task that required discrimination between novel words and words from the original list (  CVLT-III Yes/No Recognition) fell to the low end of the Borderline range (2nd percentile).  Her memory for the stories she had heard around 20-30 minutes before (WMS-IV Logical Memory-II) was relatively impaired for her age (2nd percentile) but she answered true/false questions about the story with some improved accuracy placing her performance on this task in the Low Average range (LM-II Recognition=10th-16th %).   Her recall for simple geometric figures she had copied 20-30 minutes before (WMS-IV Visual Reproduction II) was Low Average (16th percentile) compared to age and education matched peers. The patient did retain much of the information she initially learned but with some missing details and a lack of exactness in some parts of the reproduction.  Her performance on a recognition trial was impaired (WMS-IV VR-II Recognition= ?2nd %).   COWAT . FAS Total=18, T=31, 3% . Animals Total= 16, T=42, 21%  LANGUAGE: On a test of semantic  verbal fluency: Her ability to quickly generate a list of animals was in the Low Average range (Animal Naming= 21st percentile). Lexical verbal fluency (FAS) was found to be in the borderline impaired range (COWAT Letter Fluency: T=31, 3rd percentile).   Finger Tapping Test (average of 5 trials) . Dominant Hand (Lt.)  o Taps=33, T=37, 9% . Non-Dominant Hand o Taps=15, T=20, <1%   Grooved Pegboard . Dominant Hand (Lt.)  o Time=107", T=31, 3% o Drops=0 . Non-Dominant Hand o Time=206", T=16, <1% o Drops=6  FINE MOTOR COORDINATION: The patient was also administered the finger tapping test and grooved pegboard test to assess for issues related to manual dexterity as well as fine motor speed.  The patient showed mild-moderate impairment with regard to fine motor speed and manual dexterity and showed some indication of lateralized deficits, particularly on her right side.    Hand Dynamometer  . Dominant Hand (Lt.)  o 22-23 Kg, Z=0.0, 50% . Non-Dominant Hand o 7-8 Kg, Z < -1.0, <16th   GROSS MOTOR: The patient was also administered the Hand Dynamometer test to assess general body strength and further compare right hand vs. left hand strength to determine magnitude of lateralized deficits. Consistent with the results of fine motor tests, the patient showed a significant difference between the strength of her left vs. right hand, with the latter found to be significantly weaker.   Summary of Results: Overall, the results of the current neuropsychological evaluation are generally consistent with known neurological history including chronic small vessel disease. Performance was relatively intact for verbal comprehension. Aspects of processing speed (e.g., visual scanning and searching abilities), basic visual and auditory attention, semantic fluency, as well as visual memory were within broad normal limits and may be interpreted as generally persevered but may also represent mild weakness compared to  historical functioning. Visuospatial and constructional abilities as well as lexical fluency were relatively weaker to her verbal comprehension and reasoning skills and likely attributable to her metabolic/neurologic history. Results also show some significant issues on her right side (non-dominant) compared to her left with respect to motor dexterity, motor speed, and overall strength, providing some support for lateralized impairment. The patient showed relative weakness in aspects of both immediate and delayed recall. Her ability to encode, store, and retrieve verbal information was variable overall. She did show some benefit from repeated exposure to information and slightly benefitted from cueing. However, she performed variably on recognition tasks. Results do suggest the presence of some neurocognitive decline compared to estimates of baseline functioning and appear inconsistent with normal aging. There is likely involvement  of vascular risk factors, which seem to be impacting different areas of cognitive functioning.    Impression/Diagnosis: Overall, the results of the current neuropsychological evaluation are consistent with a mild major neurocognitive disorder due to vascular disease, but not with a progressive neurodegenerative dementia. Changes to speech and language, reduced encoding and new learning, slowed processing speed, lateralized motor deficits, and memory dysfunction are likely residual symptoms of prior infarct. Given the nature of the patient's cognitive limitations, she is likely to experience some problems with visually presented information,  visual spatial integration and reasoning, reduced verbal fluency and processing speed, learning and memory, motor functioning (impacting right side more than left), as well as balance and coordination. She is likely to get easily derailed when performing moderate to complex tasks.  The prognosis for further decline is medium, as she may not  experience further decline if her vascular risk factors are well controlled.We strongly recommend she and her providers continue to treat any cerebrovascular risk factors (e.g., blood pressure, diet, etc.) to prevent further decline.  The patient showed fair judgment when describing activities, she thinks she can perform safely and showed fair reasoning about different safety situations. Her description of her living plans appeared reasonable suggesting fair judgment. Currently, she is reportedly able to perform many daily activities (e.g., clean dishes, household chores), but has recently stopped driving and is limited at times due to problems with balance and coordination. She sometimes forgets to use her cane for assistance and has fallen in the past; she remains a high fall risk.  The patient does describe some mild issues of depression and residual bereavement issues due to her daughter passing away around 6 years ago.  She reports more anxiety and stress with the onset of her symptoms in October 2020.      I will provide feedback to the patient regarding the results of this objective evaluation and we will work on ways to better cope with some of the resulting changes that she is experiencing and reductions in functional capacity.  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 Neuropsychologist

## 2020-01-04 NOTE — Telephone Encounter (Signed)
[id number]

## 2020-01-05 ENCOUNTER — Other Ambulatory Visit: Payer: Self-pay

## 2020-01-05 ENCOUNTER — Ambulatory Visit: Payer: Commercial Managed Care - PPO | Attending: Internal Medicine

## 2020-01-05 DIAGNOSIS — Z20822 Contact with and (suspected) exposure to covid-19: Secondary | ICD-10-CM

## 2020-01-06 LAB — LIPID PANEL WITH LDL/HDL RATIO
Cholesterol, Total: 141 mg/dL (ref 100–199)
HDL: 61 mg/dL (ref >39)
LDL Chol Calc (NIH): 64 mg/dL (ref 0–99)
LDL/HDL Ratio: 1 ratio (ref 0.0–3.2)
Triglycerides: 86 mg/dL (ref 0–149)
VLDL Cholesterol Cal: 16 mg/dL (ref 5–40)

## 2020-01-06 LAB — NOVEL CORONAVIRUS, NAA: SARS-CoV-2, NAA: NOT DETECTED

## 2020-01-06 LAB — SARS-COV-2, NAA 2 DAY TAT

## 2020-01-08 NOTE — Progress Notes (Signed)
I agree with the above plan 

## 2020-01-16 ENCOUNTER — Ambulatory Visit: Payer: Commercial Managed Care - PPO | Admitting: Psychology

## 2020-01-22 ENCOUNTER — Other Ambulatory Visit: Payer: Self-pay | Admitting: Cardiology

## 2020-01-22 ENCOUNTER — Encounter: Payer: Commercial Managed Care - PPO | Attending: Psychology | Admitting: Psychology

## 2020-01-22 ENCOUNTER — Other Ambulatory Visit: Payer: Self-pay

## 2020-01-22 DIAGNOSIS — F0150 Vascular dementia without behavioral disturbance: Secondary | ICD-10-CM | POA: Diagnosis not present

## 2020-01-22 DIAGNOSIS — I69328 Other speech and language deficits following cerebral infarction: Secondary | ICD-10-CM | POA: Insufficient documentation

## 2020-01-22 DIAGNOSIS — R2990 Unspecified symptoms and signs involving the nervous system: Secondary | ICD-10-CM | POA: Diagnosis not present

## 2020-01-22 DIAGNOSIS — R41841 Cognitive communication deficit: Secondary | ICD-10-CM | POA: Insufficient documentation

## 2020-01-22 DIAGNOSIS — F01A0 Vascular dementia, mild, without behavioral disturbance, psychotic disturbance, mood disturbance, and anxiety: Secondary | ICD-10-CM

## 2020-01-22 DIAGNOSIS — I10 Essential (primary) hypertension: Secondary | ICD-10-CM | POA: Diagnosis present

## 2020-01-23 DIAGNOSIS — Z0289 Encounter for other administrative examinations: Secondary | ICD-10-CM

## 2020-01-24 NOTE — Progress Notes (Signed)
Today I provided feedback to the patient regarding the results of the recent neuropsychological evaluation.  We reviewed the implications of the formal objective neuropsychological testing and worked on recommendations going forward.  Below you will find the summary and impressions/diagnoses from that formal evaluation in the evaluation can be found in its entirety in the patient's EMR dated 01/03/2020.    Summary of Results:            Overall, the results of the current neuropsychological evaluation are generally consistent with known neurological history including chronic small vessel disease. Performance was relatively intact for verbal comprehension. Aspects of processing speed (e.g., visual scanning and searching abilities), basic visual and auditory attention, semantic fluency, as well as visual memory were within broad normal limits and may be interpreted as generally persevered but may also represent mild weakness compared to historical functioning. Visuospatial and constructional abilities as well as lexical fluency were relatively weaker to her verbal comprehension and reasoning skills and likely attributable to her metabolic/neurologic history. Results also show some significant issues on her right side (non-dominant) compared to her left with respect to motor dexterity, motor speed, and overall strength, providing some support for lateralized impairment. The patient showed relative weakness in aspects of both immediate and delayed recall. Her ability to encode, store, and retrieve verbal information was variable overall. She did show some benefit from repeated exposure to information and slightly benefitted from cueing. However, she performed variably on recognition tasks. Results do suggest the presence of some neurocognitive decline compared to estimates of baseline functioning and appear inconsistent with normal aging. There is likely involvement of vascular risk factors, which seem to be  impacting different areas of cognitive functioning.    Impression/Diagnosis:         Overall, the results of the current neuropsychological evaluation are consistent with a mild major neurocognitive disorder due to vascular disease, but not with a progressive neurodegenerative dementia. Changes to speech and language, reduced encoding and new learning, slowed processing speed, lateralized motor deficits, and memory dysfunction are likely residual symptoms of prior infarct. Given the nature of the patient's cognitive limitations, she is likely to experience some problems with visually presented information,  visual spatial integration and reasoning, reduced verbal fluency and processing speed, learning and memory, motor functioning (impacting right side more than left), as well as balance and coordination. She is likely to get easily derailed when performing moderate to complex tasks.  The prognosis for further decline is medium, as she may not experience further decline if her vascular risk factors are well controlled.We strongly recommend she and her providers continue to treat any cerebrovascular risk factors (e.g., blood pressure, diet, etc.) to prevent further decline.  The patient showed fair judgment when describing activities, she thinks she can perform safely and showed fair reasoning about different safety situations. Her description of her living plans appeared reasonable suggesting fair judgment. Currently, she is reportedly able to perform many daily activities (e.g., clean dishes, household chores), but has recently stopped driving and is limited at times due to problems with balance and coordination. She sometimes forgets to use her cane for assistance and has fallen in the past; she remains a high fall risk.  The patient does describe some mild issues of depression and residual bereavement issues due to her daughter passing away around 6 years ago. She reports more anxiety and stress with the  onset of her symptoms in October 2020.      I will provide feedback to  the patient regarding the results of this objective evaluation and we will work on ways to better cope with some of the resulting changes that she is experiencing and reductions in functional capacity.  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 Neuropsychologist

## 2020-03-06 ENCOUNTER — Ambulatory Visit: Payer: Self-pay | Admitting: Podiatry

## 2020-03-14 ENCOUNTER — Other Ambulatory Visit: Payer: Self-pay | Admitting: Cardiology

## 2020-03-14 ENCOUNTER — Telehealth: Payer: Self-pay

## 2020-03-14 DIAGNOSIS — I209 Angina pectoris, unspecified: Secondary | ICD-10-CM

## 2020-03-14 NOTE — Telephone Encounter (Signed)
Having headaches bp has been 144/79 -157/78, feeling pressure behind her eye. When she moves around bp tends to go up. She had been taking labetalol 1 time a day up until 7 days ago and then started taking it one tablet in the morning and one half to one whole tablet in the evening. She is concerned that she may have another stroke. Pt is willing to come in to see you if needed.

## 2020-03-14 NOTE — Telephone Encounter (Signed)
Increase labetalol to BID for now and make an appointment in 2 weeks

## 2020-03-15 NOTE — Telephone Encounter (Signed)
Spoke with patient gave Dr. Verl Dicker recommendations. She verbalized understanding.

## 2020-03-18 ENCOUNTER — Other Ambulatory Visit: Payer: Self-pay | Admitting: Cardiology

## 2020-03-18 NOTE — Telephone Encounter (Signed)
Patient called and stated that she needs her Ranexa to help prevent chest pains. She said she may been confused after her stroke and said it didn't work. Is it ok to refill?

## 2020-03-19 NOTE — Telephone Encounter (Signed)
Patient already picked up medication.

## 2020-03-20 ENCOUNTER — Other Ambulatory Visit: Payer: Self-pay | Admitting: Adult Health

## 2020-03-29 ENCOUNTER — Ambulatory Visit: Payer: Commercial Managed Care - PPO | Admitting: Cardiology

## 2020-03-29 ENCOUNTER — Other Ambulatory Visit: Payer: Self-pay

## 2020-03-29 ENCOUNTER — Encounter: Payer: Self-pay | Admitting: Cardiology

## 2020-03-29 VITALS — BP 171/93 | HR 86 | Resp 16 | Ht 64.0 in | Wt 190.0 lb

## 2020-03-29 DIAGNOSIS — I25118 Atherosclerotic heart disease of native coronary artery with other forms of angina pectoris: Secondary | ICD-10-CM

## 2020-03-29 DIAGNOSIS — I10 Essential (primary) hypertension: Secondary | ICD-10-CM

## 2020-03-29 MED ORDER — LABETALOL HCL 200 MG PO TABS: 200.0000 mg | ORAL_TABLET | Freq: Two times a day (BID) | ORAL | 3 refills | Status: DC

## 2020-03-29 NOTE — Progress Notes (Signed)
Primary Physician/Referring:  Merrilee Seashore, MD  Patient ID: Tonya Boyd, female    DOB: 1956-06-20, 64 y.o.   MRN: [id number]  Chief Complaint  Patient presents with  . Hypertension    BP Medication  . Follow-up    2 week   HPI:    Tonya Boyd  is a 64 y.o. 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  She now presents here for follow-up of coronary artery disease and hyperlipidemia.  She made an appointment to see [place] due to elevated blood pressure.  She has occasional episodes of headaches with elevated blood pressure.  She has not had any recurrence of angina pectoris.   Past Medical History:  Diagnosis Date  . Angina pectoris ([place])   . Asthma    "as a child and as an adult" (08/02/2018)  . CAD (coronary artery disease), native coronary artery 08/03/2018  . Chronic bronchitis ([place])   . Chronic kidney disease   . Coronary artery disease   . Family history of adverse reaction to anesthesia    "cousin stopped breathing; he was allergic to the anesthesia" (08/02/2018)  . GERD (gastroesophageal reflux disease)   . High cholesterol   . History of gout   . History of hiatal hernia   . History of kidney stones   . Hypertension 07/2018  . Interstitial cystitis   . NSVT (nonsustained ventricular tachycardia) ([place]) 07/2018  . Raynaud's disease    "feet and hands" (08/02/2018)  . Raynaud's disease without gangrene 10/15/2018   Past Surgical History:  Procedure Laterality Date  . ABDOMINAL HYSTERECTOMY    . ANTERIOR CERVICAL DECOMP/DISCECTOMY FUSION    . APPENDECTOMY    . AUGMENTATION MAMMAPLASTY Bilateral   . BACK SURGERY    . CORONARY ANGIOPLASTY WITH STENT PLACEMENT  08/02/2018  .  CORONARY STENT INTERVENTION N/A 08/02/2018   Procedure: CORONARY STENT INTERVENTION;  Surgeon: Adrian Prows, MD;  Location: [facility];  Service: Cardiovascular;  Laterality: N/A;  RCA   . CYSTOSCOPY W/ STONE MANIPULATION    . INTRAVASCULAR PRESSURE WIRE/FFR STUDY N/A 10/07/2018   Procedure: INTRAVASCULAR PRESSURE WIRE/FFR STUDY;  Surgeon: Adrian Prows, MD;  Location: [facility];  Service: Cardiovascular;  Laterality: N/A;  . KNEE ARTHROSCOPY Right   . LAPAROSCOPIC CHOLECYSTECTOMY    . LEFT HEART CATH AND CORONARY ANGIOGRAPHY N/A 10/07/2018   Procedure: LEFT HEART CATH AND CORONARY ANGIOGRAPHY;  Surgeon: Adrian Prows, MD;  Location: [facility];  Service: Cardiovascular;  Laterality: N/A;  . REPAIR ANKLE LIGAMENT Left    "tied up ligament; had tore up qthing in my ankle"  . RIGHT HEART CATH  10/07/2018  . RIGHT/LEFT HEART CATH AND CORONARY ANGIOGRAPHY N/A 08/02/2018   Procedure: RIGHT/LEFT HEART CATH AND CORONARY ANGIOGRAPHY;  Surgeon: Adrian Prows, MD;  Location: [facility];  Service: Cardiovascular;  Laterality: N/A;  . SHOULDER SURGERY     bilateral  . TONSILLECTOMY    . TUBAL LIGATION     Social History   Tobacco Use  . Smoking status: Never Smoker  . Smokeless tobacco: Never Used  Substance Use Topics  . Alcohol use: Not Currently    Comment: 08/02/2018 "not since my 20's"  Marital Status: Married  ROS  Review of Systems  Cardiovascular: Positive for chest pain. Negative for dyspnea on exertion and leg swelling.  Gastrointestinal: Negative for melena.  Psychiatric/Behavioral: The patient is nervous/anxious.    Objective  Blood pressure (!) 171/93, pulse 86, resp. rate 16, height 5' 4"  (1.626 m), weight 190 lb (86.2 kg), SpO2 97 %.  Vitals with BMI 03/29/2020 03/29/2020 12/29/2019  Height - 5' 4"  5' 4"   Weight - 190 lbs 168 lbs  BMI - 00.9 23.30  Systolic 076 226 333  Diastolic 93 87 86  Pulse 86 67 82     Physical Exam Constitutional:       Appearance: She is well-developed.  Cardiovascular:     Rate and Rhythm: Normal rate and regular rhythm.     Heart sounds: Normal heart sounds. No murmur heard.  No gallop.   Pulmonary:     Effort: Pulmonary effort is normal. No respiratory distress.  Abdominal:     General: Bowel sounds are normal.     Palpations: Abdomen is soft.    Laboratory examination:   Recent Labs    06/08/19 1043 06/08/19 1058 06/13/19 1400 06/13/19 1402 06/13/19 1430  NA 138   < > 139 136 142  K 4.5   < > 5.7* 7.3* 4.0  CL 107   < > 103 107 104  CO2 20*  --  23  --  26  GLUCOSE 76   < > 78 81 81  BUN 21   < > 18 26* 15  CREATININE 0.85   < > 1.04* 1.00 0.99  CALCIUM 8.9  --  9.5  --  9.2  GFRNONAA >60  --  58*  --  >60  GFRAA >60  --  >60  --  >60   < > = values in this interval not displayed.   CrCl cannot be calculated (Patient's most recent lab result is older than the maximum 21 days allowed.).  CMP Latest Ref Rng & Units 06/13/2019 06/13/2019 06/13/2019  Glucose 70 - 99 mg/dL 81 81 78  BUN 8 - 23 mg/dL 15 26(H) 18  Creatinine 0.44 - 1.00 mg/dL 0.99 1.00 1.04(H)  Sodium 135 - 145 mmol/L 142 136 139  Potassium 3.5 - 5.1 mmol/L 4.0 7.3(HH) 5.7(H)  Chloride 98 - 111 mmol/L 104 107 103  CO2 22 - 32 mmol/L 26 - 23  Calcium 8.9 - 10.3 mg/dL 9.2 - 9.5  Total Protein 6.5 - 8.1 g/dL - - 7.1  Total Bilirubin 0.3 - 1.2 mg/dL - - 1.3(H)  Alkaline Phos 38 - 126 U/L - - 85  AST 15 - 41 U/L - - 36  ALT 0 - 44 U/L - - 24   CBC Latest Ref Rng & Units 06/13/2019 06/13/2019 06/08/2019  WBC 4.0 - 10.5 K/uL - 6.9 -  Hemoglobin 12.0 - 15.0 g/dL 15.6(H) 15.0 13.3  Hematocrit 36 - 46 % 46.0 46.5(H) 39.0  Platelets 150 - 400 K/uL - 187 -   Lipid Panel     Component Value Date/Time   CHOL 141 01/05/2020 0816   TRIG 86 01/05/2020 0816   HDL 61 01/05/2020 0816   LDLCALC 64 01/05/2020 0816   HEMOGLOBIN A1C No results found for: HGBA1C, MPG TSH No results for input(s): TSH in the last 8760  hours.  External labs:   Lab 11/18/2019: BUN 26, creatinine 1.76, EGFR 56 mL, potassium 4.3.  CMP normal.  Total cholesterol 271, triglycerides 142, HDL 63, LDL  182.  A1c 5.2%.  Lab 12/22/2019: TSH normal, Hb 14.4/HCT 43.0, platelets 264  Lab 05/09/2019: Serum glucose 88, BUN 27, creatinine 1.2, EGFR 50.1/60.83. Potassium 4.8. Sodium 139. CMP otherwise normal.  Total cholesterol 149, triglycerides 73, HDL 67, LDL 67, non-HDL cholesterol 82.  Medications and allergies   Allergies  Allergen Reactions  . Adhesive [Tape] Other (See Comments)    SKIN WILL TEAR EASILY!!!!  . Statins Other (See Comments)    MYAILGIAS   . Carvedilol   . Diovan [Valsartan]     Tingling   . Hydrochlorothiazide   . Ibuprofen   . Keflex [Cephalexin]     confusion  . Metoclopramide Hcl     "Made me feel goofy"  . Montelukast Sodium Diarrhea  . Morphine And Related     hyper  . Norvasc [Amlodipine Besylate]     Numbness and tingling   . Nsaids     Esophagus became red and swollen (affected it)  . Oxycodone Itching and Other (See Comments)    Welts, also (Tylox)  . Oxycodone-Acetaminophen Itching and Other (See Comments)    Welts, also  . Paracetamol [Acetaminophen] Itching    Tylox = Welts, also  . Plavix [Clopidogrel Bisulfate] Other (See Comments)    Red brusing  . Prednisone     Elevated BP  . Reglan [Metoclopramide]     confusion  . Singulair [Montelukast] Diarrhea     Current Outpatient Medications  Medication Instructions  . aspirin EC 81 mg, Oral, Daily  . cyclobenzaprine (FLEXERIL) 10 mg, Oral, Daily at bedtime  . divalproex (DEPAKOTE ER) 250 MG 24 hr tablet TAKE ONE TABLET BY MOUTH ONCE DAILY.  . hydrOXYzine (ATARAX/VISTARIL) 25 MG tablet 1 tablet, Daily PRN  . isosorbide mononitrate (IMDUR) 30 MG 24 hr tablet TAKE 1/2 A TABLET AFTER WAKING IN THE AM AND 1/2 TABLET 2-3 HOURS LATER.  Marland Kitchen labetalol (NORMODYNE) 200 mg, Oral, 2 times daily  . meloxicam (MOBIC) 15 mg, Oral,  Daily  . nitroGLYCERIN (NITROSTAT) 0.4 mg, Sublingual, Every 5 min PRN  . oxybutynin (DITROPAN) 2.5 mg, Oral, Daily at bedtime  . pantoprazole (PROTONIX) 20 MG tablet TAKE 1 TABLET BY MOUTH ONCE A DAY.  . ranolazine (RANEXA) 500 MG 12 hr tablet TAKE ONE TABLET BY MOUTH TWICE DAILY.  Marland Kitchen REPATHA SURECLICK 295 MG/ML SOAJ INJECT 140 MG INTO THE SKIN EVERY 2 WEEKS.  . triazolam (HALCION) 0.125 mg, Oral, Daily at bedtime  . verapamil (CALAN-SR) 120 MG CR tablet TAKE 1 TABLET BY MOUTH ONCE A DAY.  Marland Kitchen Vitamin D (Ergocalciferol) (DRISDOL) 50,000 Units, Oral, Every 7 days, Tuesdays    Radiology:   Chest X-Ray 01/09/2017: IMPRESSION: No active cardiopulmonary disease.  MR Angio Head WO Contrast 06/13/2019: IMPRESSION: 1. No evidence of ischemia on repeat diffusion imaging. 2. No abnormal enhancement following contrast. 3. Intracranial MRA is within normal limits.  EEG 07/05/19: Normal electroencephalogram, awake, asleep and with activation procedures. There are no focal lateralizing or epileptiform features.   EKG:  EKG 12/29/2019: Normal sinus rhythm with rate of 80 bpm, left axis deviation, left anterior fascicular block.  Poor R progression, cannot exclude anteroseptal infarct old.  Nonspecific abnormality, cannot exclude anterolateral ischemia.  Abnormal EKG.  Low-voltage.  No significant change from 06/13/2019.  Cardiac Studies:   Lexiscan Sestamibi stress test 07/22/2018:  1. Lexiscan stress test was performed. Exercise capacity was not assessed. Stress symptoms included chest pain, dyspnea, nausea, dizziness. Peak blood pressure was 148/98 mmHg. The resting and stress electrocardiogram  demonstrated normal sinus rhythm, normal resting conduction, no resting arrhythmias and normal rest repolarization.  Stress EKG is non diagnostic for ischemia as it is a pharmacologic stress.  2. The overall quality of the study is good.  Left ventricular cavity is noted to be normal on the rest and stress  studies.  Gated SPECT images reveal normal myocardial thickening and wall motion.  The left ventricular ejection fraction was calculated 45%, although visually appears normal. SPECT images reveal small area of mild intensity, reversible perfusion defect in apical inferolateral myocardium, that may represent ischemia.  3. Intermediate risk study due to small perfusion defect and mildly reduced LVEF. Recommend correlation with echocardiogram.  Echocardiogram 07/23/2018: Normal LV systolic function, grade 1 diastolic dysfunction, mildly calcified aortic leaflets, no significant valvular abnormality.  7 day event monitor 07/13/2018-07/19/2018: Dominant rhythm: Normal sinus rhythm.  11 patient activated events occurred with symptoms of chest pain, shortness of breath, rapid heart rate, correlating with sinus tachycardia.  Fastest tachycardia episode was day one at 1400 with 133 beats per without reported symptoms.  When autodetect event of sinus rhythm with 6 beats of NSVT on day 2 10:51 AM with symptoms of chest pain, shortness of breath.  No A. fib or SVT was noted.  Coronary angiogram 10/07/2018: No change from 08/02/2018 except D1 stenosis 50-60%, DFR negative. Normal right heart catheterization. The left ventricular systolic function is normal. LV end diastolic pressure is normal. Superdominant large RCA. Prox RCA to Mid RCA lesion is 80% stenosed S/P STENT ORSIRO DES 4.0X30 with 0% residual stenosis. Ost RPDA lesion is 50% stenosed. Small LAD ends before reaching apex.   Assessment     ICD-10-CM   1. Essential hypertension  I10 labetalol (NORMODYNE) 200 MG tablet  2. Coronary artery disease of native artery of native heart with stable angina pectoris ([place])  I25.118     Meds ordered this encounter  Medications  . labetalol (NORMODYNE) 200 MG tablet    Sig: Take 1 tablet (200 mg total) by mouth 2 (two) times daily.    Dispense:  180 tablet    Refill:  3    Medications Discontinued During This  Encounter  Medication Reason  . Bempedoic Acid-Ezetimibe (NEXLIZET) 180-10 MG TABS Patient Preference  . labetalol (NORMODYNE) 100 MG tablet Reorder     Recommendations:   DALMA PANCHAL  is a 64 y.o. 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    She made an appointment to see me due to elevated blood pressure.  She has also gained weight since her strokelike symptoms.  Suspect this is the etiology for her elevated blood pressure.  She has been intolerant to many medications, but tolerating labetalol, will increase it to 200 mg p.o. twice daily.  With regard to coronary artery disease, she is remained stable.  She is presently taking Ranexa 1 tablet once a day and isosorbide mononitrate 30 mg 1/2 tablet daily and states that she has not had recurrence of angina pectoris.  Advised her to discontinue Ranexa and increase isosorbide mononitrate to 30 mg daily however if she has recurrence of angina she can go back to taking the medication as she was previously taking.  Hence medication changes were not done at the  AVS level.  She will continue to monitor her blood pressure, I will see her back in 6 months.   Adrian Prows, MD, [facility] 03/29/2020, 3:27 PM Office: [phone]

## 2020-04-19 ENCOUNTER — Other Ambulatory Visit: Payer: Self-pay | Admitting: Adult Health

## 2020-04-27 ENCOUNTER — Other Ambulatory Visit: Payer: Self-pay | Admitting: Cardiology

## 2020-05-07 ENCOUNTER — Other Ambulatory Visit: Payer: Self-pay

## 2020-05-07 ENCOUNTER — Encounter: Payer: Self-pay | Admitting: Adult Health

## 2020-05-07 ENCOUNTER — Ambulatory Visit (INDEPENDENT_AMBULATORY_CARE_PROVIDER_SITE_OTHER): Payer: Commercial Managed Care - PPO | Admitting: Adult Health

## 2020-05-07 VITALS — BP 140/85 | HR 75 | Ht 64.0 in | Wt 193.0 lb

## 2020-05-07 DIAGNOSIS — G43009 Migraine without aura, not intractable, without status migrainosus: Secondary | ICD-10-CM

## 2020-05-07 DIAGNOSIS — I10 Essential (primary) hypertension: Secondary | ICD-10-CM

## 2020-05-07 DIAGNOSIS — E785 Hyperlipidemia, unspecified: Secondary | ICD-10-CM

## 2020-05-07 DIAGNOSIS — R2990 Unspecified symptoms and signs involving the nervous system: Secondary | ICD-10-CM | POA: Diagnosis not present

## 2020-05-07 MED ORDER — DIVALPROEX SODIUM ER 250 MG PO TB24: 250.0000 mg | ORAL_TABLET | Freq: Every day | ORAL | 3 refills | Status: DC

## 2020-05-07 NOTE — Progress Notes (Signed)
I agree with the above plan 

## 2020-05-07 NOTE — Progress Notes (Signed)
[facility] [address] [place]. [place] [address] [phone]       OFFICE FOLLOW UP NOTE  [name] Date of Birth:  [birth year] Medical Record Number:  [id number]   Referring MD: Tonya Boyd Reason for Referral: Stroke like episode  Chief Complaint  Patient presents with  . Follow-up    tx rm  . Cerebrovascular Accident    Pt here for a f/u from a stroke. Pt is having no new sx everything is the same.      HPI:   Today, 05/07/2020, Tonya Boyd returns for stroke like episode follow-up unaccompanied  Continues to experience right hemiparesis, speech impairment and cognitive deficit which have been stable She did have formal neurocognitive evaluation by Dr. 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  Reported changes to speech and language, reduced encoding and new learning, slowed processing speed, lateralized motor deficits and memory dysfunction are likely residual symptoms of prior infarct. Denies new stroke/TIA symptoms  Continues on aspirin 81 mg daily and Repatha for secondary stroke prevention without side effects Dr. Jacinto Boyd manages repatha and did recently have lipid panel completed with LDL 64 Blood pressure today 140/85 -monitors at home which has been stable Remains on Depakote ER 250 mg daily for migraine prophylaxis.  She denies any recent migraine occurrence. Does have have occasional mild headaches but typically associated with motion Was having increased headaches likely due to elevated blood pressure and after medication adjustments by Dr. Jacinto Boyd, blood pressures improved and worsening headaches resolved  No further concerns at this time    History provided for reference purposes only Virtual visit 01/01/2020 Tonya Boyd: Tonya Boyd is is being seen via virtual visit for follow-up regarding prior strokelike episode in 06/2019.  Residual symptoms of right sided weakness, speech impairment with  childlike quality and cognitive deficit which have been stable without worsening. Evaluated by Dr. Kieth Boyd on 12/26/2019 and plans on undergoing neurocognitive evaluation today.  Continues on Depakote 250 mg daily for ongoing migraine prophylaxis with benefit.  Previously on 500 mg dosage and denies any worsening with lowering dose.  Mild occasional headaches but denies migrainous headaches.  Continues on aspirin 81 mg daily and Repatha for stroke prevention.  Continues to follow with Dr. Jacinto Boyd, cardiology, routinely.  No concerns at this time.  Update 08/15/2019: Tonya Boyd is a 64 year old female who is being seen today for follow-up accompanied by her husband.  She continues to have speech difficulty with hesitancy and childlike quality.  She also continues to complain of cognition and memory difficulties with getting confused easily as well as subjective right-sided weakness and numbness.  She continues to ambulate with a cane and does endorse a couple falls due to tripping on her right foot due to reported weakness.  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  Currently on short-term disability by PCP and plans on pursuing disability in the near future.  She has since completed therapies with reports of improvement of prior deficits.  After prior visit, underwent EEG which was normal. She was started on Depakote 500 mg daily at prior visit due to potential complicated migraine versus seizure activity which she continues on without difficulty.  She denies reoccurring headaches or worsening of symptoms.  She does endorse occasional depression or sadness which has been more present thinking of her daughter that passed away 5 years ago.  She also endorses increased stress/anxiety  at times that can worsen her cognition and speech.  She continues on aspirin 81 mg daily without bleeding or bruising.  Previously prescribed Repatha but due to joint pain,  this has been discontinued and plans on following with cardiology for initiation of a different type of cholesterol lowering agent.    Continues to follow with cardiology regularly for HTN and HLD management.  Blood pressure today 125/82.  No further concerns at this time.    Initial visit 06/15/2019 Dr. Pearlean Boyd: [name] is a 64 year old Caucasian lady seen today for initial office consultation visit.  She is accompanied by her husband.  History is obtained from them, review of electronic medical records and I personally reviewed imaging films in PACS.  She has past medical history of hypertension, hyperlipidemia, coronary artery disease, kidney stones, hiatal hernia, gastroesophageal reflux disease, chronic bronchitis.  She states she had episode in early September when she woke up she felt she could not think right and she had brain fog she was off balance numbness on the right side of the face and tongue as well as right hand she is went and saw her primary care physician who ordered an outpatient MRI scan of the brain done on 05/17/2019 [facility] which I personally reviewed showed no acute abnormalities.  Mild changes of chronic small vessel disease only noted.  She was seen in the ER on 06/08/2019 with sudden onset of speech change and right-sided weakness.  She also had some chest pain in the morning and took some nitroglycerin.  She also had a headache.  She states she does not get many headaches.  CT scan of the head in the ER was unremarkable.  She had some subjective speech difficulties and right-sided weakness with giveaway weakness as per Dr. Alene Boyd's exam.  MRI scan of the brain was obtained which showed no acute abnormality and only changes of small vessel disease.  MRI of the brain showed no significant large vessel stenosis..  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   Patient states symptoms have persisted since then.  She is able to speak now but has  trouble speaking and speaks in with a childlike quality to her speech.  Still has subjective weakness and numbness on the right side but is able to walk without assistance.  She states she cannot think because she has brain fog and will not be able to return to work.  She feels better.  She denies any prior known history of strokes TIAs or seizures.  She does take aspirin every day and is on medication for blood pressure and cholesterol for her coronary artery disease.  She denies prior history of migraines or headaches with neurological symptoms.  She denies any significant ongoing stress in her life.  Her daughter died 5 years ago but she states she is coping with that.  Patient feels she is unable to work because she gets confused and the brain cannot think right.  She has in fact not even been driving.      ROS:   14 system review of systems is positive for headaches, speech difficulty, memory loss and weakness, all other systems negative   PMH:  Past Medical History:  Diagnosis Date  . Angina pectoris (HCC)   . Asthma    "as a child and as an adult" (08/02/2018)  . CAD (coronary artery disease), native coronary artery 08/03/2018  . Chronic bronchitis (HCC)   . Chronic kidney disease   .  Coronary artery disease   . Family history of adverse reaction to anesthesia    "cousin stopped breathing; he was allergic to the anesthesia" (08/02/2018)  . GERD (gastroesophageal reflux disease)   . High cholesterol   . History of gout   . History of hiatal hernia   . History of kidney stones   . Hypertension 07/2018  . Interstitial cystitis   . NSVT (nonsustained ventricular tachycardia) (HCC) 07/2018  . Raynaud's disease    "feet and hands" (08/02/2018)  . Raynaud's disease without gangrene 10/15/2018    Social History:  Social History   Socioeconomic History  . Marital status: Married    Spouse name: Not on file  . Number of children: 3  . Years of education: GED  . Highest education  level: Not on file  Occupational History    Employer: [employer]  Tobacco Use  . Smoking status: Never Smoker  . Smokeless tobacco: Never Used  Vaping Use  . Vaping Use: Never used  Substance and Sexual Activity  . Alcohol use: Not Currently    Comment: 08/02/2018 "not since my 20's"  . Drug use: Never  . Sexual activity: Not Currently  Other Topics Concern  . Not on file  Social History Narrative  . Not on file   Social Determinants of Health   Financial Resource Strain:   . Difficulty of Paying Living Expenses: Not on file  Food Insecurity:   . Worried About Programme researcher, broadcasting/film/video in the Last Year: Not on file  . Ran Out of Food in the Last Year: Not on file  Transportation Needs:   . Lack of Transportation (Medical): Not on file  . Lack of Transportation (Non-Medical): Not on file  Physical Activity:   . Days of Exercise per Week: Not on file  . Minutes of Exercise per Session: Not on file  Stress:   . Feeling of Stress : Not on file  Social Connections:   . Frequency of Communication with Friends and Family: Not on file  . Frequency of Social Gatherings with Friends and Family: Not on file  . Attends Religious Services: Not on file  . Active Member of Clubs or Organizations: Not on file  . Attends Banker Meetings: Not on file  . Marital Status: Not on file  Intimate Partner Violence:   . Fear of Current or Ex-Partner: Not on file  . Emotionally Abused: Not on file  . Physically Abused: Not on file  . Sexually Abused: Not on file    Medications:   Current Outpatient Medications on File Prior to Visit  Medication Sig Dispense Refill  . aspirin EC 81 MG tablet Take 81 mg by mouth daily.    . cyclobenzaprine (FLEXERIL) 10 MG tablet Take 10 mg by mouth at bedtime.    . divalproex (DEPAKOTE ER) 250 MG 24 hr tablet TAKE ONE TABLET BY MOUTH ONCE DAILY. 30 tablet 0  . hydrOXYzine (ATARAX/VISTARIL) 25 MG tablet Take 1 tablet by mouth daily  as needed. (Patient not taking: Reported on 03/29/2020)    . isosorbide mononitrate (IMDUR) 30 MG 24 hr tablet TAKE 1/2 A TABLET AFTER WAKING IN THE AM AND 1/2 TABLET 2-3 HOURS LATER. 30 tablet 11  . labetalol (NORMODYNE) 200 MG tablet Take 1 tablet (200 mg total) by mouth 2 (two) times daily. 180 tablet 3  . meloxicam (MOBIC) 15 MG tablet Take 15 mg by mouth daily.    . nitroGLYCERIN (NITROSTAT) 0.4 MG  SL tablet Place 1 tablet (0.4 mg total) under the tongue every 5 (five) minutes as needed for chest pain. 30 tablet 2  . oxybutynin (DITROPAN) 5 MG tablet Take 2.5 mg by mouth at bedtime.   1  . pantoprazole (PROTONIX) 20 MG tablet TAKE 1 TABLET BY MOUTH ONCE A DAY. 30 tablet 6  . ranolazine (RANEXA) 500 MG 12 hr tablet TAKE ONE TABLET BY MOUTH TWICE DAILY. 60 tablet 0  . REPATHA SURECLICK 140 MG/ML SOAJ INJECT 140 MG INTO THE SKIN EVERY 2 WEEKS. 2 mL 6  . triazolam (HALCION) 0.125 MG tablet Take 0.125 mg by mouth at bedtime.    . verapamil (CALAN-SR) 120 MG CR tablet TAKE 1 TABLET BY MOUTH ONCE A DAY. 90 tablet 1  . Vitamin D, Ergocalciferol, (DRISDOL) 1.25 MG (50000 UT) CAPS capsule Take 50,000 Units by mouth every 7 (seven) days. Tuesdays     No current facility-administered medications on file prior to visit.    Allergies:   Allergies  Allergen Reactions  . Adhesive [Tape] Other (See Comments)    SKIN WILL TEAR EASILY!!!!  . Statins Other (See Comments)    MYAILGIAS   . Carvedilol   . Diovan [Valsartan]     Tingling   . Hydrochlorothiazide   . Ibuprofen   . Keflex [Cephalexin]     confusion  . Metoclopramide Hcl     "Made me feel goofy"  . Montelukast Sodium Diarrhea  . Morphine And Related     hyper  . Norvasc [Amlodipine Besylate]     Numbness and tingling   . Nsaids     Esophagus became red and swollen (affected it)  . Oxycodone Itching and Other (See Comments)    Welts, also (Tylox)  . Oxycodone-Acetaminophen Itching and Other (See Comments)    Welts, also  .  Paracetamol [Acetaminophen] Itching    Tylox = Welts, also  . Plavix [Clopidogrel Bisulfate] Other (See Comments)    Red brusing  . Prednisone     Elevated BP  . Reglan [Metoclopramide]     confusion  . Singulair [Montelukast] Diarrhea     Today's Vitals   05/07/20 1509  BP: 140/85  Pulse: 75  Weight: 193 lb (87.5 kg)  Height:  (1.626 m)   Body mass index is 33.13 kg/m.   Physical Exam General: well developed, well nourished,  pleasant middle-aged Caucasian female, seated, in no evident distress Head: head normocephalic and atraumatic.   Neck: supple with no carotid or supraclavicular bruits Cardiovascular: regular rate and rhythm, no murmurs Musculoskeletal: no deformity Skin:  no rash/petichiae Vascular:  Normal pulses all extremities   Neurologic Exam Mental Status: Awake and fully alert.   Childlike quality of speech but no evidence of true dysarthria or aphasia.  Oriented to place and time. Recent memory impaired and remote memory intact. Attention span, concentration and fund of knowledge appropriate during visit. Mood and affect appropriate.  Cranial Nerves: Pupils equal, briskly reactive to light. Extraocular movements full without nystagmus. Visual fields full to confrontation. Hearing intact. Facial sensation intact. Face, tongue, palate moves normally and symmetrically.  Motor: Normal bulk and tone.  Normal strength left upper and lower extremity.  Difficulty fully assessing right upper and lower extremity due to poor effort and giveaway weakness Sensory.:  Diminished touch, pinprick, position and vibratory sensation on right hemibody including forehead with splitting of midline Coordination: Rapid alternating movements normal in all extremities. Finger-to-nose and heel-to-shin performed accurately on left side.  She  was unable to perform testing on the right side due to subjective weakness Gait and Station: Arises from chair without difficulty. Stance is normal.  Gait demonstrates normal stride length and balance with use of cane Reflexes: 1+ and symmetric. Toes downgoing.       ASSESSMENT/PLAN: 88 year patient with speech difficulty, right sided weakness and numbness from stroke like episode of unclear etiology. MRI brain x 2 negative for stroke. 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 Exam shows nonorganic features.vascular risk factors of CAD s/p angioplasty to right RCA 07/2018, HTN and HLD with statin intolerance.     Strokelike episode -Residual subjective right-sided weakness with sensory impairment, abnormal speech impairment and cognitive impairment -Continue aspirin 81 mg daily and Repatha for secondary stroke prevention -Continue to follow PCP/cardiology for aggressive stroke risk factor management including HTN with BP goal<130/90 and HLD with LDL goal<70  Migraines -Continue Depakote ER 250 mg daily for migraine prophylaxis -She will call with any worsening headaches or migraines -May consider discontinuing at follow-up visit if remains stable   Follow-up in 6 months or earlier if needed   I spent 20 minutes of face-to-face and non-face-to-face time with patient.  This included previsit chart review, lab review, study review, order entry, electronic health record documentation, patient education regarding strokelike episode with residual deficits, managing stroke risk factors, ongoing use of Depakote for migraine prophylaxis and answered all other questions to patient satisfaction    Ihor Austin, AGNP-BC  [facility] [address] [place], [place] [address]  Phone [phone] Fax [phone] Note: This document was prepared with digital dictation and possible smart phrase technology. Any transcriptional errors that result from this process are unintentional.

## 2020-05-07 NOTE — Patient Instructions (Signed)
Continue aspirin 81 mg daily  and Repatha  for secondary stroke prevention  Continue depakote ER 250mg  daily for headache management   Continue to follow up with PCP regarding cholesterol and blood pressure management  Maintain strict control of hypertension with blood pressure goal below 130/90 and cholesterol with LDL cholesterol (bad cholesterol) goal below 70 mg/dL.       Followup in the future with me in 6 months or call earlier if needed       Thank you for coming to see at [facility]. I hope we have been able to provide you high quality care today.  You may receive a patient satisfaction survey over the next few weeks. We would appreciate your feedback and comments so that we may continue to improve ourselves and the health of our patients.

## 2020-05-27 ENCOUNTER — Other Ambulatory Visit: Payer: Commercial Managed Care - PPO

## 2020-05-27 ENCOUNTER — Other Ambulatory Visit: Payer: Self-pay

## 2020-05-27 DIAGNOSIS — Z20822 Contact with and (suspected) exposure to covid-19: Secondary | ICD-10-CM

## 2020-05-29 ENCOUNTER — Telehealth: Payer: Self-pay

## 2020-05-29 LAB — SPECIMEN STATUS REPORT

## 2020-05-29 LAB — NOVEL CORONAVIRUS, NAA: SARS-CoV-2, NAA: NOT DETECTED

## 2020-05-29 LAB — SARS-COV-2, NAA 2 DAY TAT

## 2020-05-29 NOTE — Telephone Encounter (Signed)
Can come in for an EKG if covid -ve

## 2020-05-29 NOTE — Telephone Encounter (Signed)
Patient called and wanted to be seen for ekg or monitor.  She said she is having chest pain/sob. Primary care did covid test which is still pending.

## 2020-05-30 ENCOUNTER — Ambulatory Visit: Payer: Commercial Managed Care - PPO | Admitting: Student

## 2020-05-30 ENCOUNTER — Other Ambulatory Visit: Payer: Self-pay

## 2020-05-30 DIAGNOSIS — R002 Palpitations: Secondary | ICD-10-CM

## 2020-05-30 DIAGNOSIS — I25118 Atherosclerotic heart disease of native coronary artery with other forms of angina pectoris: Secondary | ICD-10-CM

## 2020-05-30 DIAGNOSIS — I209 Angina pectoris, unspecified: Secondary | ICD-10-CM

## 2020-05-30 NOTE — Progress Notes (Signed)
ICD-10-CM   1. Angina pectoris (HCC)  I20.9 EKG 12-Lead  2. Coronary artery disease of native artery of native heart with stable angina pectoris (HCC)  I25.118   3. Palpitations  R00.2     EKG 05/30/2020: Normal sinus rhythm with rate of 73 bpm, left axis deviation, left intrafascicular block, incomplete right bundle branch block.  Poor R wave progression, cannot exclude anterolateral infarct old, low-voltage complexes.  Pulmonary disease pattern.   Discussed with patient EKG reveals normal sinus rhythm at a rate of 73 bpm.  Reassured her she is not having an acute event.  Patient expressed understanding.  Will keep follow-up appointment as previously scheduled.

## 2020-06-04 ENCOUNTER — Encounter (HOSPITAL_COMMUNITY): Payer: Self-pay | Admitting: Physical Therapy

## 2020-07-05 ENCOUNTER — Ambulatory Visit: Payer: Commercial Managed Care - PPO | Admitting: Cardiology

## 2020-07-24 ENCOUNTER — Other Ambulatory Visit: Payer: Self-pay | Admitting: Cardiology

## 2020-09-19 ENCOUNTER — Other Ambulatory Visit: Payer: Self-pay | Admitting: Cardiology

## 2020-10-04 ENCOUNTER — Ambulatory Visit: Payer: Commercial Managed Care - PPO | Admitting: Cardiology

## 2020-10-18 ENCOUNTER — Encounter: Payer: Self-pay | Admitting: Cardiology

## 2020-10-18 ENCOUNTER — Other Ambulatory Visit: Payer: Self-pay

## 2020-10-18 ENCOUNTER — Ambulatory Visit: Payer: Commercial Managed Care - PPO | Admitting: Cardiology

## 2020-10-18 VITALS — BP 143/88 | HR 86 | Temp 97.9°F | Ht 64.0 in | Wt 199.0 lb

## 2020-10-18 DIAGNOSIS — I209 Angina pectoris, unspecified: Secondary | ICD-10-CM

## 2020-10-18 DIAGNOSIS — R0600 Dyspnea, unspecified: Secondary | ICD-10-CM

## 2020-10-18 DIAGNOSIS — I10 Essential (primary) hypertension: Secondary | ICD-10-CM

## 2020-10-18 DIAGNOSIS — R0789 Other chest pain: Secondary | ICD-10-CM

## 2020-10-18 DIAGNOSIS — E7800 Pure hypercholesterolemia, unspecified: Secondary | ICD-10-CM

## 2020-10-18 DIAGNOSIS — R0609 Other forms of dyspnea: Secondary | ICD-10-CM

## 2020-10-18 DIAGNOSIS — I25118 Atherosclerotic heart disease of native coronary artery with other forms of angina pectoris: Secondary | ICD-10-CM

## 2020-10-18 MED ORDER — LABETALOL HCL 100 MG PO TABS: 200.0000 mg | ORAL_TABLET | Freq: Two times a day (BID) | ORAL | 3 refills | Status: DC

## 2020-10-18 MED ORDER — ISOSORBIDE MONONITRATE ER 30 MG PO TB24
30.0000 mg | ORAL_TABLET | Freq: Every day | ORAL | 3 refills | Status: DC
Start: 2020-10-18 — End: 2021-06-19

## 2020-10-18 MED ORDER — VERAPAMIL HCL ER 120 MG PO TBCR: 120.0000 mg | EXTENDED_RELEASE_TABLET | Freq: Every evening | ORAL | 3 refills | Status: DC

## 2020-10-18 MED ORDER — EZETIMIBE 10 MG PO TABS: 10.0000 mg | ORAL_TABLET | Freq: Every day | ORAL | 2 refills | Status: DC

## 2020-10-18 NOTE — Progress Notes (Signed)
Primary Physician/Referring:  Merrilee Seashore, MD  Patient ID: Tonya Boyd, female    DOB: 1956/03/10, 65 y.o.   MRN: [id number]  Chief Complaint  Patient presents with  . Coronary Artery Disease  . Follow-up   HPI:    Tonya Boyd  is a 65 y.o. 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  Tonya Boyd now presents here for follow-up of coronary artery disease and hyperlipidemia.  Tonya Boyd is extremely emotional today as Tonya Boyd lost her brother recently from cancer.  States that her blood pressure has been very low in fact Tonya Boyd has to stop taking the medications in the morning as Tonya Boyd has had significant dizziness and Tonya Boyd has recorded her blood pressure to be around 90 to 100 mmHg.  Tonya Boyd also complains of sharp chest pain in the left upper part of the chest.  Tonya Boyd has gained about 35 pounds in weight since I last saw her and states that Tonya Boyd notices decreased exercise tolerance and mild dyspnea.   Past Medical History:  Diagnosis Date  . Angina pectoris ([place])   . Asthma    "as a child and as an adult" (08/02/2018)  . CAD (coronary artery disease), native coronary artery 08/03/2018  . Chronic bronchitis ([place])   . Chronic kidney disease   . Coronary artery disease   . Family history of adverse reaction to anesthesia    "cousin stopped breathing; he was allergic to the anesthesia" (08/02/2018)  . GERD (gastroesophageal reflux disease)   . High cholesterol   . History of gout   . History of hiatal hernia   . History of kidney stones   . Hypertension 07/2018  . Interstitial cystitis   . NSVT (nonsustained ventricular tachycardia) ([place]) 07/2018  . Raynaud's disease    "feet and hands" (08/02/2018)  . Raynaud's  disease without gangrene 10/15/2018   Past Surgical History:  Procedure Laterality Date  . ABDOMINAL HYSTERECTOMY    . ANTERIOR CERVICAL DECOMP/DISCECTOMY FUSION    . APPENDECTOMY    . AUGMENTATION MAMMAPLASTY Bilateral   . BACK SURGERY    . CORONARY ANGIOPLASTY WITH STENT PLACEMENT  08/02/2018  . CORONARY STENT INTERVENTION N/A 08/02/2018   Procedure: CORONARY STENT INTERVENTION;  Surgeon: Adrian Prows, MD;  Location: [facility];  Service: Cardiovascular;  Laterality: N/A;  RCA   . CYSTOSCOPY W/ STONE MANIPULATION    . INTRAVASCULAR PRESSURE WIRE/FFR STUDY N/A 10/07/2018   Procedure: INTRAVASCULAR PRESSURE WIRE/FFR STUDY;  Surgeon: Adrian Prows, MD;  Location: [facility];  Service: Cardiovascular;  Laterality: N/A;  . KNEE ARTHROSCOPY Right   . LAPAROSCOPIC CHOLECYSTECTOMY    . LEFT HEART CATH AND CORONARY ANGIOGRAPHY N/A 10/07/2018   Procedure: LEFT HEART CATH AND CORONARY ANGIOGRAPHY;  Surgeon: Adrian Prows, MD;  Location: [facility];  Service: Cardiovascular;  Laterality: N/A;  . REPAIR ANKLE LIGAMENT Left    "tied up ligament; had tore up qthing in my ankle"  . RIGHT HEART CATH  10/07/2018  . RIGHT/LEFT HEART CATH AND CORONARY ANGIOGRAPHY N/A 08/02/2018   Procedure: RIGHT/LEFT HEART CATH AND CORONARY ANGIOGRAPHY;  Surgeon: Adrian Prows, MD;  Location: [facility];  Service: Cardiovascular;  Laterality: N/A;  . SHOULDER SURGERY  bilateral  . TONSILLECTOMY    . TUBAL LIGATION     Social History   Tobacco Use  . Smoking status: Never Smoker  . Smokeless tobacco: Never Used  Substance Use Topics  . Alcohol use: Not Currently    Comment: 08/02/2018 "not since my 20's"   Marital Status: Married  ROS  Review of Systems  Constitutional: Positive for weight gain.  Cardiovascular: Positive for chest pain and dyspnea on exertion. Negative for leg swelling.  Gastrointestinal: Negative for melena.  Psychiatric/Behavioral: Positive for depression. The patient  is nervous/anxious.    Objective  Blood pressure (!) 143/88, pulse 86, temperature 97.9 F (36.6 C), height 5' 4"  (1.626 m), weight 199 lb (90.3 kg), SpO2 96 %.  Vitals with BMI 10/18/2020 05/07/2020 03/29/2020  Height 5' 4"  5' 4"  -  Weight 199 lbs 193 lbs -  BMI 27.51 70.01 -  Systolic 749 449 675  Diastolic 88 85 93  Pulse 86 75 86     Physical Exam Constitutional:      Appearance: Tonya Boyd is well-developed. Tonya Boyd is obese.  Cardiovascular:     Rate and Rhythm: Normal rate and regular rhythm.     Heart sounds: Normal heart sounds. No murmur heard. No gallop.   Pulmonary:     Effort: Pulmonary effort is normal. No respiratory distress.  Abdominal:     General: Bowel sounds are normal.     Palpations: Abdomen is soft.    Laboratory examination:   No results for input(s): NA, K, CL, CO2, GLUCOSE, BUN, CREATININE, CALCIUM, GFRNONAA, GFRAA in the last 8760 hours. CrCl cannot be calculated (Patient's most recent lab result is older than the maximum 21 days allowed.).  CMP Latest Ref Rng & Units 06/13/2019 06/13/2019 06/13/2019  Glucose 70 - 99 mg/dL 81 81 78  BUN 8 - 23 mg/dL 15 26(H) 18  Creatinine 0.44 - 1.00 mg/dL 0.99 1.00 1.04(H)  Sodium 135 - 145 mmol/L 142 136 139  Potassium 3.5 - 5.1 mmol/L 4.0 7.3(HH) 5.7(H)  Chloride 98 - 111 mmol/L 104 107 103  CO2 22 - 32 mmol/L 26 - 23  Calcium 8.9 - 10.3 mg/dL 9.2 - 9.5  Total Protein 6.5 - 8.1 g/dL - - 7.1  Total Bilirubin 0.3 - 1.2 mg/dL - - 1.3(H)  Alkaline Phos 38 - 126 U/L - - 85  AST 15 - 41 U/L - - 36  ALT 0 - 44 U/L - - 24   CBC Latest Ref Rng & Units 06/13/2019 06/13/2019 06/08/2019  WBC 4.0 - 10.5 K/uL - 6.9 -  Hemoglobin 12.0 - 15.0 g/dL 15.6(H) 15.0 13.3  Hematocrit 36.0 - 46.0 % 46.0 46.5(H) 39.0  Platelets 150 - 400 K/uL - 187 -   Lipid Panel     Component Value Date/Time   CHOL 141 01/05/2020 0816   TRIG 86 01/05/2020 0816   HDL 61 01/05/2020 0816   LDLCALC 64 01/05/2020 0816   HEMOGLOBIN A1C No results found  for: HGBA1C, MPG TSH No results for input(s): TSH in the last 8760 hours.  External labs:     Labs 06/21/2020:   Serum glucose 84, BUN 25, creatinine 0.94, EGFR 65 mill, potassium 4.5, CMP otherwise normal.  Hb 14.4/HCT 43.0, platelets 264.  Cholesterol, Total 174  100-199  Triglycerides 88  0-149  HDL Cholesterol 68  >39  VLDL Cholesterol Cal 16  5-40  LDL Chol Calc (NIH) 90  0-99  LDL/HDL Ratio 1.3  0.0-3.2     Lab  11/18/2019: BUN 26, creatinine 1.76, EGFR 56 mL, potassium 4.3.  CMP normal.  Total cholesterol 271, triglycerides 142, HDL 63, LDL 182.  A1c 5.2%.  Lab 12/22/2019: TSH normal, Hb 14.4/HCT 43.0, platelets 264  Lab 05/09/2019: Serum glucose 88, BUN 27, creatinine 1.2, EGFR 50.1/60.83. Potassium 4.8. Sodium 139. CMP otherwise normal.  Total cholesterol 149, triglycerides 73, HDL 67, LDL 67, non-HDL cholesterol 82.  Medications and allergies   Allergies  Allergen Reactions  . Adhesive [Tape] Other (See Comments)    SKIN WILL TEAR EASILY!!!!  . Statins Other (See Comments)    MYAILGIAS   . Carvedilol   . Diovan [Valsartan]     Tingling   . Hydrochlorothiazide   . Ibuprofen   . Keflex [Cephalexin]     confusion  . Metoclopramide Hcl     "Made me feel goofy"  . Montelukast Sodium Diarrhea  . Morphine And Related     hyper  . Norvasc [Amlodipine Besylate]     Numbness and tingling   . Nsaids     Esophagus became red and swollen (affected it)  . Oxycodone Itching and Other (See Comments)    Welts, also (Tylox)  . Oxycodone-Acetaminophen Itching and Other (See Comments)    Welts, also  . Paracetamol [Acetaminophen] Itching    Tylox = Welts, also  . Plavix [Clopidogrel Bisulfate] Other (See Comments)    Red brusing  . Prednisone     Elevated BP  . Reglan [Metoclopramide]     confusion  . Singulair [Montelukast] Diarrhea    Current Outpatient Medications on File Prior to Visit  Medication Sig Dispense Refill  . aspirin EC 81 MG  tablet Take 81 mg by mouth daily.    . cyclobenzaprine (FLEXERIL) 10 MG tablet Take 10 mg by mouth at bedtime.    . divalproex (DEPAKOTE ER) 250 MG 24 hr tablet Take 1 tablet (250 mg total) by mouth daily. 90 tablet 3  . hydrOXYzine (ATARAX/VISTARIL) 25 MG tablet Take 1 tablet by mouth daily as needed.    . meloxicam (MOBIC) 15 MG tablet Take 15 mg by mouth daily.    . nitroGLYCERIN (NITROSTAT) 0.4 MG SL tablet Place 1 tablet (0.4 mg total) under the tongue every 5 (five) minutes as needed for chest pain. 30 tablet 2  . oxybutynin (DITROPAN) 5 MG tablet Take 2.5 mg by mouth at bedtime.   1  . pantoprazole (PROTONIX) 20 MG tablet TAKE 1 TABLET BY MOUTH ONCE A DAY. 30 tablet 6  . REPATHA SURECLICK 832 MG/ML SOAJ INJECT 140 MG INTO THE SKIN EVERY 2 WEEKS. 2 mL 6  . triazolam (HALCION) 0.125 MG tablet Take 0.125 mg by mouth at bedtime.    . Vitamin D, Ergocalciferol, (DRISDOL) 1.25 MG (50000 UT) CAPS capsule Take 50,000 Units by mouth every 7 (seven) days. Tuesdays     No current facility-administered medications on file prior to visit.    Radiology:   Chest X-Ray 01/09/2017: IMPRESSION: No active cardiopulmonary disease.  MR Angio Head WO Contrast 06/13/2019: IMPRESSION: 1. No evidence of ischemia on repeat diffusion imaging. 2. No abnormal enhancement following contrast. 3. Intracranial MRA is within normal limits.  EEG 07/05/19: Normal electroencephalogram, awake, asleep and with activation procedures. There are no focal lateralizing or epileptiform features.   EKG:   EKG 10/18/2020: Normal sinus rhythm at rate of 82 bpm, left axis deviation, left anterior fascicular block.  Incomplete right bundle branch block.  Poor R wave progression, cannot exclude anteroseptal infarct old.  Borderline low voltage complexes.  No significant change from EKG 12/29/2019.  Cardiac Studies:   Lexiscan Sestamibi stress test 07/22/2018:  1. Lexiscan stress test was performed. Exercise capacity was not  assessed. Stress symptoms included chest pain, dyspnea, nausea, dizziness. Peak blood pressure was 148/98 mmHg. The resting and stress electrocardiogram demonstrated normal sinus rhythm, normal resting conduction, no resting arrhythmias and normal rest repolarization.  Stress EKG is non diagnostic for ischemia as it is a pharmacologic stress.  2. The overall quality of the study is good.  Left ventricular cavity is noted to be normal on the rest and stress studies.  Gated SPECT images reveal normal myocardial thickening and wall motion.  The left ventricular ejection fraction was calculated 45%, although visually appears normal. SPECT images reveal small area of mild intensity, reversible perfusion defect in apical inferolateral myocardium, that may represent ischemia.  3. Intermediate risk study due to small perfusion defect and mildly reduced LVEF. Recommend correlation with echocardiogram.  Echocardiogram 07/23/2018: Normal LV systolic function, grade 1 diastolic dysfunction, mildly calcified aortic leaflets, no significant valvular abnormality.  7 day event monitor 07/13/2018-07/19/2018: Dominant rhythm: Normal sinus rhythm.  11 patient activated events occurred with symptoms of chest pain, shortness of breath, rapid heart rate, correlating with sinus tachycardia.  Fastest tachycardia episode was day one at 1400 with 133 beats per without reported symptoms.  When autodetect event of sinus rhythm with 6 beats of NSVT on day 2 10:51 AM with symptoms of chest pain, shortness of breath.  No A. fib or SVT was noted.  Coronary angiogram 10/07/2018: No change from 08/02/2018 except D1 stenosis 50-60%, DFR negative. Normal right heart catheterization. The left ventricular systolic function is normal. LV end diastolic pressure is normal. Superdominant large RCA. Prox RCA to Mid RCA lesion is 80% stenosed S/P STENT ORSIRO DES 4.0X30 with 0% residual stenosis. Ost RPDA lesion is 50% stenosed. Small LAD ends  before reaching apex.   Assessment     ICD-10-CM   1. Coronary artery disease of native artery of native heart with stable angina pectoris ([place])  I25.118 EKG 12-Lead    PCV ECHOCARDIOGRAM COMPLETE  2. Angina pectoris ([facility])  I20.9 isosorbide mononitrate (IMDUR) 30 MG 24 hr tablet  3. Chest pain, musculoskeletal  R07.89   4. Essential hypertension  I10 verapamil (CALAN-SR) 120 MG CR tablet    labetalol (NORMODYNE) 100 MG tablet  5. Pure hypercholesterolemia  E78.00 ezetimibe (ZETIA) 10 MG tablet  6. Dyspnea on exertion  R06.00 PCV ECHOCARDIOGRAM COMPLETE    Meds ordered this encounter  Medications  . verapamil (CALAN-SR) 120 MG CR tablet    Sig: Take 1 tablet (120 mg total) by mouth every evening.    Dispense:  90 tablet    Refill:  3  . isosorbide mononitrate (IMDUR) 30 MG 24 hr tablet    Sig: Take 1 tablet (30 mg total) by mouth daily at 12 noon.    Dispense:  90 tablet    Refill:  3  . labetalol (NORMODYNE) 100 MG tablet    Sig: Take 2 tablets (200 mg total) by mouth 2 (two) times daily at 10 AM and 5 PM.    Dispense:  180 tablet    Refill:  3  . ezetimibe (ZETIA) 10 MG tablet    Sig: Take 1 tablet (10 mg total) by mouth daily after supper.    Dispense:  30 tablet    Refill:  2    Medications Discontinued During This Encounter  Medication Reason  . isosorbide mononitrate (IMDUR) 30 MG 24 hr tablet   . labetalol (NORMODYNE) 200 MG tablet   . verapamil (CALAN-SR) 120 MG CR tablet      Recommendations:   Tonya Boyd  is a 65 y.o. 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  Tonya Boyd now presents here for follow-up of coronary artery disease and hyperlipidemia.   Tonya Boyd has gained about 35 pounds  in weight over the past 6 to 8 months, suspect her dyspnea is related to weight gain.  We discussed regarding weight loss extensively.  I will obtain an echocardiogram.  With regard to chest pain, clearly musculoskeletal, sharp pain worse on taking deep breath last a few seconds, does not occur on a regular basis, I simply reassured her.  With regard to hyperlipidemia, with weight gain her LDL is not at goal.  Tonya Boyd is presently on Repatha as Tonya Boyd has got statin intolerance, I will add Zetia 10 mg daily, Tonya Boyd has labs including lipids pending to be done in April by Dr. Ashby Dawes and I will certainly obtain these from her records.  With regard to hypertension, although blood pressure was elevated, home recordings clearly reveal markedly reduced blood pressure episodes.  Today Tonya Boyd was extremely emotional with losing her brother from cancer and Tonya Boyd also had to put her dog to sleep.  Suspect reactive hypertension.  In view of low blood pressure and dizziness, Tonya Boyd is in fact bruised her abdomen after a near fall, I have reduced her labetalol from 200 mg twice daily to 100 and twice daily.  I will also spread the medications.  I would like to see her back in 3 months for follow-up.   Adrian Prows, MD, [facility] 10/18/2020, 1:40 PM Office: [phone] Pager: [phone]

## 2020-10-24 ENCOUNTER — Telehealth: Payer: Self-pay

## 2020-10-24 NOTE — Telephone Encounter (Signed)
Pt called to say labetalol 200 mg bid is too much for her and it is making her bp bottom out

## 2020-10-25 ENCOUNTER — Other Ambulatory Visit: Payer: Self-pay | Admitting: Cardiology

## 2020-11-01 ENCOUNTER — Other Ambulatory Visit: Payer: Self-pay

## 2020-11-01 ENCOUNTER — Ambulatory Visit: Payer: Commercial Managed Care - PPO

## 2020-11-01 ENCOUNTER — Other Ambulatory Visit: Payer: Commercial Managed Care - PPO

## 2020-11-01 DIAGNOSIS — R0600 Dyspnea, unspecified: Secondary | ICD-10-CM

## 2020-11-01 DIAGNOSIS — I25118 Atherosclerotic heart disease of native coronary artery with other forms of angina pectoris: Secondary | ICD-10-CM

## 2020-11-01 DIAGNOSIS — R0609 Other forms of dyspnea: Secondary | ICD-10-CM

## 2020-11-04 ENCOUNTER — Encounter: Payer: Self-pay | Admitting: Adult Health

## 2020-11-04 ENCOUNTER — Other Ambulatory Visit: Payer: Self-pay

## 2020-11-04 ENCOUNTER — Ambulatory Visit: Payer: Commercial Managed Care - PPO | Admitting: Adult Health

## 2020-11-04 VITALS — BP 144/89 | HR 90 | Ht 65.0 in | Wt 200.0 lb

## 2020-11-04 DIAGNOSIS — R2990 Unspecified symptoms and signs involving the nervous system: Secondary | ICD-10-CM

## 2020-11-04 DIAGNOSIS — G43009 Migraine without aura, not intractable, without status migrainosus: Secondary | ICD-10-CM | POA: Diagnosis not present

## 2020-11-04 NOTE — Progress Notes (Signed)
[facility] [address] [place]. [place] [address] [phone]       OFFICE FOLLOW UP NOTE  [name] Date of Birth:  03-Mar-1956 Medical Record Number:  [id number]   Referring MD: Kennis Carina Reason for Referral: Stroke like episode  Chief Complaint  Patient presents with  . Follow-up    RM 14  Pt is well, things are the same       HPI:   Today, 11/04/2020, Tonya Boyd returns for scheduled 78-month follow-up.  Doing well from a stroke standpoint without new or reoccurring stroke/TIA symptoms Residual right-sided weakness, speech impairment and cognitive impairment stable  Reports compliance on aspirin 81 mg daily without any bruising Reports compliance on Repatha and cardiology recently initiated Zetia 10 mg daily -denies side effects and plans on repeating lipid panel in April with PCP Blood pressure today 144/89  On Depakote ER 250 mg daily for migraine prophylaxis She will occassionally have migraines which have slightly worsened over the past few months likely in setting of family stressors including the passing of her brother at the end of January after being told of a cancer diagnosis in December as well as putting her dog down.  She does have family support as well as close friends and very active in her church.  No new concerns at this time.    History provided for reference purposes only Update 05/07/2020 JM: Tonya Boyd returns for stroke like episode follow-up unaccompanied Continues to experience right hemiparesis, speech impairment and cognitive deficit which have been stable She did have formal neurocognitive evaluation by Dr. 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  Reported changes to speech and language, reduced encoding and new learning, slowed processing speed, lateralized motor deficits and memory dysfunction are likely residual symptoms of prior infarct. Denies new stroke/TIA  symptoms Continues on aspirin 81 mg daily and Repatha for secondary stroke prevention without side effects Dr. Jacinto Halim manages repatha and did recently have lipid panel completed with LDL 64 Blood pressure today 140/85 -monitors at home which has been stable Remains on Depakote ER 250 mg daily for migraine prophylaxis.  She denies any recent migraine occurrence. Does have have occasional mild headaches but typically associated with motion Was having increased headaches likely due to elevated blood pressure and after medication adjustments by Dr. Jacinto Halim, blood pressures improved and worsening headaches resolved No further concerns at this time  Virtual visit 01/01/2020 JM: Tonya Boyd is is being seen via virtual visit for follow-up regarding prior strokelike episode in 06/2019.  Residual symptoms of right sided weakness, speech impairment with childlike quality and cognitive deficit which have been stable without worsening. Evaluated by Dr. Kieth Brightly on 12/26/2019 and plans on undergoing neurocognitive evaluation today.  Continues on Depakote 250 mg daily for ongoing migraine prophylaxis with benefit.  Previously on 500 mg dosage and denies any worsening with lowering dose.  Mild occasional headaches but denies migrainous headaches.  Continues on aspirin 81 mg daily and Repatha for stroke prevention.  Continues to follow with Dr. Jacinto Halim, cardiology, routinely.  No concerns at this time.  Update 08/15/2019: Tonya Boyd is a 65 year old female who is being seen today for follow-up accompanied by her husband.  She continues to have speech difficulty with hesitancy and childlike quality.  She also continues to complain of cognition and memory difficulties with getting confused easily as well as subjective right-sided weakness and numbness.  She continues to ambulate with a cane and does endorse  a couple falls due to tripping on her right foot due to reported weakness.  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  Currently on short-term disability by PCP and plans on pursuing disability in the near future.  She has since completed therapies with reports of improvement of prior deficits.  After prior visit, underwent EEG which was normal. She was started on Depakote 500 mg daily at prior visit due to potential complicated migraine versus seizure activity which she continues on without difficulty.  She denies reoccurring headaches or worsening of symptoms.  She does endorse occasional depression or sadness which has been more present thinking of her daughter that passed away 5 years ago.  She also endorses increased stress/anxiety at times that can worsen her cognition and speech.  She continues on aspirin 81 mg daily without bleeding or bruising.  Previously prescribed Repatha but due to joint pain, this has been discontinued and plans on following with cardiology for initiation of a different type of cholesterol lowering agent.    Continues to follow with cardiology regularly for HTN and HLD management.  Blood pressure today 125/82.  No further concerns at this time.    Initial visit 06/15/2019 Dr. Pearlean BrownieSethi: Tonya Boyd is a 65 year old Caucasian lady seen today for initial office consultation visit.  She is accompanied by her husband.  History is obtained from them, review of electronic medical records and I personally reviewed imaging films in PACS.  She has past medical history of hypertension, hyperlipidemia, coronary artery disease, kidney stones, hiatal hernia, gastroesophageal reflux disease, chronic bronchitis.  She states she had episode in early September when she woke up she felt she could not think right and she had brain fog she was off balance numbness on the right side of the face and tongue as well as right hand she is went and saw her primary care physician who ordered an outpatient MRI scan of the brain done on 05/17/2019 [facility] which I personally  reviewed showed no acute abnormalities.  Mild changes of chronic small vessel disease only noted.  She was seen in the ER on 06/08/2019 with sudden onset of speech change and right-sided weakness.  She also had some chest pain in the morning and took some nitroglycerin.  She also had a headache.  She states she does not get many headaches.  CT scan of the head in the ER was unremarkable.  She had some subjective speech difficulties and right-sided weakness with giveaway weakness as per Dr. Alene MiresKirkpatrick's exam.  MRI scan of the brain was obtained which showed no acute abnormality and only changes of small vessel disease.  MRI of the brain showed no significant large vessel stenosis..  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   Patient states symptoms have persisted since then.  She is able to speak now but has trouble speaking and speaks in with a childlike quality to her speech.  Still has subjective weakness and numbness on the right side but is able to walk without assistance.  She states she cannot think because she has brain fog and will not be able to return to work.  She feels better.  She denies any prior known history of strokes TIAs or seizures.  She does take aspirin every day and is on medication for blood pressure and cholesterol for her coronary artery disease.  She denies prior history of migraines or headaches  with neurological symptoms.  She denies any significant ongoing stress in her life.  Her daughter died 5 years ago but she states she is coping with that.  Patient feels she is unable to work because she gets confused and the brain cannot think right.  She has in fact not even been driving.      ROS:   14 system review of systems is positive for headaches, speech difficulty, memory loss and weakness, all other systems negative   PMH:  Past Medical History:  Diagnosis Date  . Angina pectoris (HCC)   . Asthma    "as a child and as an adult" (08/02/2018)   . CAD (coronary artery disease), native coronary artery 08/03/2018  . Chronic bronchitis (HCC)   . Chronic kidney disease   . Coronary artery disease   . Family history of adverse reaction to anesthesia    "cousin stopped breathing; he was allergic to the anesthesia" (08/02/2018)  . GERD (gastroesophageal reflux disease)   . High cholesterol   . History of gout   . History of hiatal hernia   . History of kidney stones   . Hypertension 07/2018  . Interstitial cystitis   . NSVT (nonsustained ventricular tachycardia) (HCC) 07/2018  . Raynaud's disease    "feet and hands" (08/02/2018)  . Raynaud's disease without gangrene 10/15/2018    Social History:  Social History   Socioeconomic History  . Marital status: Married    Spouse name: Not on file  . Number of children: 3  . Years of education: GED  . Highest education level: Not on file  Occupational History    Employer: [employer]  Tobacco Use  . Smoking status: Never Smoker  . Smokeless tobacco: Never Used  Vaping Use  . Vaping Use: Never used  Substance and Sexual Activity  . Alcohol use: Not Currently    Comment: 08/02/2018 "not since my 20's"  . Drug use: Never  . Sexual activity: Not Currently  Other Topics Concern  . Not on file  Social History Narrative  . Not on file   Social Determinants of Health   Financial Resource Strain: Not on file  Food Insecurity: Not on file  Transportation Needs: Not on file  Physical Activity: Not on file  Stress: Not on file  Social Connections: Not on file  Intimate Partner Violence: Not on file    Medications:   Current Outpatient Medications on File Prior to Visit  Medication Sig Dispense Refill  . aspirin EC 81 MG tablet Take 81 mg by mouth daily.    . cyclobenzaprine (FLEXERIL) 10 MG tablet Take 10 mg by mouth at bedtime.    . divalproex (DEPAKOTE ER) 250 MG 24 hr tablet Take 1 tablet (250 mg total) by mouth daily. 90 tablet 3  . ezetimibe (ZETIA)  10 MG tablet Take 1 tablet (10 mg total) by mouth daily after supper. 30 tablet 2  . hydrOXYzine (ATARAX/VISTARIL) 25 MG tablet Take 1 tablet by mouth daily as needed.    . isosorbide mononitrate (IMDUR) 30 MG 24 hr tablet Take 1 tablet (30 mg total) by mouth daily at 12 noon. 90 tablet 3  . labetalol (NORMODYNE) 100 MG tablet Take 2 tablets (200 mg total) by mouth 2 (two) times daily at 10 AM and 5 PM. 180 tablet 3  . meloxicam (MOBIC) 15 MG tablet Take 15 mg by mouth daily.    . nitroGLYCERIN (NITROSTAT) 0.4 MG SL tablet Place 1 tablet (0.4 mg  total) under the tongue every 5 (five) minutes as needed for chest pain. 30 tablet 2  . oxybutynin (DITROPAN) 5 MG tablet Take 2.5 mg by mouth at bedtime.   1  . pantoprazole (PROTONIX) 20 MG tablet TAKE 1 TABLET BY MOUTH ONCE A DAY. 30 tablet 6  . REPATHA SURECLICK 140 MG/ML SOAJ INJECT 140 MG INTO THE SKIN EVERY 2 WEEKS. 2 mL 0  . triazolam (HALCION) 0.125 MG tablet Take 0.125 mg by mouth at bedtime.    . verapamil (CALAN-SR) 120 MG CR tablet Take 1 tablet (120 mg total) by mouth every evening. 90 tablet 3  . Vitamin D, Ergocalciferol, (DRISDOL) 1.25 MG (50000 UT) CAPS capsule Take 50,000 Units by mouth every 7 (seven) days. Tuesdays     No current facility-administered medications on file prior to visit.    Allergies:   Allergies  Allergen Reactions  . Adhesive [Tape] Other (See Comments)    SKIN WILL TEAR EASILY!!!!  . Statins Other (See Comments)    MYAILGIAS   . Carvedilol   . Diovan [Valsartan]     Tingling   . Hydrochlorothiazide   . Ibuprofen   . Keflex [Cephalexin]     confusion  . Metoclopramide Hcl     "Made me feel goofy"  . Montelukast Sodium Diarrhea  . Morphine And Related     hyper  . Norvasc [Amlodipine Besylate]     Numbness and tingling   . Nsaids     Esophagus became red and swollen (affected it)  . Oxycodone Itching and Other (See Comments)    Welts, also (Tylox)  . Oxycodone-Acetaminophen Itching and Other (See  Comments)    Welts, also  . Paracetamol [Acetaminophen] Itching    Tylox = Welts, also  . Plavix [Clopidogrel Bisulfate] Other (See Comments)    Red brusing  . Prednisone     Elevated BP  . Reglan [Metoclopramide]     confusion  . Singulair [Montelukast] Diarrhea     Today's Vitals   11/04/20 1519  Weight: 200 lb (90.7 kg)  Height: 5\' 5"  (1.651 m)   Body mass index is 33.28 kg/m.   Physical Exam General: well developed, well nourished,  pleasant middle-aged Caucasian female, seated, in no evident distress Head: head normocephalic and atraumatic.   Neck: supple with no carotid or supraclavicular bruits Cardiovascular: regular rate and rhythm, no murmurs Musculoskeletal: no deformity Skin:  no rash/petichiae Vascular:  Normal pulses all extremities   Neurologic Exam Mental Status: Awake and fully alert. Childlike quality of speech but no evidence of true dysarthria or aphasia.  Oriented to place and time. Recent memory impaired and remote memory intact. Attention span, concentration and fund of knowledge appropriate during visit. Mood and affect appropriate.  Cranial Nerves: Pupils equal, briskly reactive to light. Extraocular movements full without nystagmus. Visual fields full to confrontation. Hearing intact. Facial sensation intact. Face, tongue, palate moves normally and symmetrically.  Motor: Normal bulk and tone.  Normal strength left upper and lower extremity.  Difficulty fully assessing right upper and lower extremity due to poor effort and giveaway weakness - during normal conversation, she was able to use her right arm to pick up and hold her phone as well as pick up her purse from on the ground Sensory.:  Diminished touch, pinprick, position and vibratory sensation on right hemibody including forehead with splitting of midline Coordination: Rapid alternating movements normal in all extremities. Finger-to-nose and heel-to-shin performed accurately on left side.  She was  unable to perform testing on the right side due to subjective weakness Gait and Station: Arises from chair without difficulty. Stance is normal. Gait demonstrates normal stride length and balance with use of cane and over exaggerated RLE step height Reflexes: 1+ and symmetric. Toes downgoing.       ASSESSMENT/PLAN: 63 year patient with speech difficulty, right sided weakness and numbness from stroke like episode of unclear etiology possibly conversion disorder. MRI brain x 2 negative for stroke. Exam shows nonorganic features. Vascular risk factors of chronic migraines, CAD s/p angioplasty to right RCA 07/2018, HTN and HLD with statin intolerance.     Strokelike episode with residual deficits -Residual subjective right-sided weakness with sensory impairment, abnormal speech impairment and cognitive impairment - stable w/o worsening -Continue aspirin 81 mg daily and Repatha and zetia for secondary stroke prevention -Continue to follow PCP/cardiology for aggressive stroke risk factor management including HTN with BP goal<130/90 and HLD with LDL goal<70  Migraines -Continue Depakote ER 250 mg daily for migraine prophylaxis -Educated on fluctuation of headaches/migraines in setting of increased stressors and encouraged continued stress reduction techniques as well as increasing physical activity and improving diet   Follow-up in 6 months or earlier if needed   CC:  [facility] provider: Dr. Brion Aliment, Campbell Lerner, MD    I spent 30 minutes of face-to-face and non-face-to-face time with patient.  This included previsit chart review, lab review, study review, order entry, electronic health record documentation, patient education regarding strokelike episode with residual deficits, managing stroke risk factors, ongoing use of Depakote for migraine prophylaxis and answered all other questions to patient satisfaction   Ihor Austin, AGNP-BC  [facility] [address] [place], [place] [address]  Phone [phone] Fax [phone] Note: This document was prepared with digital dictation and possible smart phrase technology. Any transcriptional errors that result from this process are unintentional.

## 2020-11-04 NOTE — Patient Instructions (Signed)
Continue depakote ER 250mg  nightly for headache management  Continue aspirin 81 mg daily  and Repatha and Zetia  for secondary stroke prevention  Continue to follow up with PCP regarding cholesterol and blood pressure management  Maintain strict control of hypertension with blood pressure goal below 130/90 and cholesterol with LDL cholesterol (bad cholesterol) goal below 70 mg/dL.       Followup in the future with me in 6 months or call earlier if needed       Thank you for coming to see at [facility]. I hope we have been able to provide you high quality care today.  You may receive a patient satisfaction survey over the next few weeks. We would appreciate your feedback and comments so that we may continue to improve ourselves and the health of our patients.

## 2020-11-05 NOTE — Progress Notes (Signed)
I agree with the above plan 

## 2020-11-07 NOTE — Progress Notes (Signed)
Called and spoke with patient regarding her echo test results.

## 2020-11-22 ENCOUNTER — Other Ambulatory Visit: Payer: Self-pay | Admitting: Cardiology

## 2020-11-25 ENCOUNTER — Other Ambulatory Visit: Payer: Self-pay

## 2020-11-25 MED ORDER — REPATHA SURECLICK 140 MG/ML ~~LOC~~ SOAJ: SUBCUTANEOUS | 6 refills | Status: DC

## 2020-12-12 ENCOUNTER — Encounter (HOSPITAL_COMMUNITY): Payer: Self-pay | Admitting: Physical Therapy

## 2021-01-05 ENCOUNTER — Telehealth: Payer: Self-pay | Admitting: Student

## 2021-01-05 DIAGNOSIS — R002 Palpitations: Secondary | ICD-10-CM

## 2021-01-05 NOTE — Telephone Encounter (Signed)
ON-CALL CARDIOLOGY 01/05/21  Patient's name: Tonya Boyd.   MRN: [id number].    DOB: April 28, 1956 Primary care provider: Georgianne Fick, MD. Primary cardiologist: Burnett Kanaris  Interaction regarding this patient's care today: Patient states yesterday noted her heart rate to be elevated at 150 bpm with associated chest pressure. States this lasted about 2 hours and relieved spontaneously. She then woke up with morning with heart rate 130 bpm, she took her daily verapamil and heart rate is now 70 bpm at time of phone call with blood pressure 130/70 mmHg. Patient states she continues to have chest pressure with activity this morning, which is rapidly relieved by rest. Denies dizziness, lightheadedness, syncope, near-syncope. Patient also reports intermittent palpations.   Notably she is presently being treated by PCP for acute asthma exacerbation with bronchitis with prednisone and levofloxacin.   Patient has known history of stable angina, which she states symptoms are similar to, with the exception of elevated heart rate.   Impression:   ICD-10-CM   1. Palpitations  R00.2     No orders of the defined types were placed in this encounter.   No orders of the defined types were placed in this encounter.   Recommendations: Advised patient to continue to monitor heart rate and symptoms. Advised her to take sublingual nitroglycerin and monitor chest pain. Patient verbalized understanding of nitroglycerin use. Advised patient that if symptoms do not improve would advise her to be evaluated in the emergency department given her history of CAD and exertional symptoms. Counseled patient regarding signs and symptoms at would warrant urgent evaluation, she verbalized understanding and agreement.   Telephone encounter total time: 14 minutes     Rayford Halsted, PA-C 01/05/2021, 12:52 PM Office: [phone]

## 2021-01-17 ENCOUNTER — Ambulatory Visit: Payer: Commercial Managed Care - PPO | Admitting: Cardiology

## 2021-01-24 ENCOUNTER — Other Ambulatory Visit: Payer: Self-pay | Admitting: Cardiology

## 2021-01-24 DIAGNOSIS — E7800 Pure hypercholesterolemia, unspecified: Secondary | ICD-10-CM

## 2021-02-12 ENCOUNTER — Other Ambulatory Visit: Payer: Self-pay | Admitting: Internal Medicine

## 2021-02-12 DIAGNOSIS — Z1231 Encounter for screening mammogram for malignant neoplasm of breast: Secondary | ICD-10-CM

## 2021-02-14 ENCOUNTER — Encounter: Payer: Self-pay | Admitting: Cardiology

## 2021-02-14 ENCOUNTER — Other Ambulatory Visit: Payer: Self-pay

## 2021-02-14 ENCOUNTER — Ambulatory Visit: Payer: Commercial Managed Care - PPO | Admitting: Cardiology

## 2021-02-14 VITALS — BP 156/92 | HR 86 | Temp 98.0°F | Resp 17 | Ht 65.0 in | Wt 192.0 lb

## 2021-02-14 DIAGNOSIS — E7800 Pure hypercholesterolemia, unspecified: Secondary | ICD-10-CM

## 2021-02-14 DIAGNOSIS — I25118 Atherosclerotic heart disease of native coronary artery with other forms of angina pectoris: Secondary | ICD-10-CM

## 2021-02-14 DIAGNOSIS — R002 Palpitations: Secondary | ICD-10-CM

## 2021-02-14 DIAGNOSIS — I10 Essential (primary) hypertension: Secondary | ICD-10-CM

## 2021-02-14 MED ORDER — VERAPAMIL HCL ER 120 MG PO TBCR: 120.0000 mg | EXTENDED_RELEASE_TABLET | Freq: Two times a day (BID) | ORAL | 1 refills | Status: DC

## 2021-02-14 NOTE — Progress Notes (Signed)
Primary Physician/Referring:  Merrilee Seashore, MD  Patient ID: Tonya Boyd, female    DOB: 1956/01/08, 65 y.o.   MRN: [id number]  Chief Complaint  Patient presents with   Chest Pain   Follow-up   Palpitations   HPI:    Tonya Boyd  is a 65 y.o. 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  She now presents here for follow-up of coronary artery disease and hyperlipidemia.  She had called all group on call regarding palpitations.  She has been worried that she may be having A. fib as she has a "smart watch" with EKG monitoring capability.  She is also worried about frequent episodes of chest pain.  States that about 2 to 3 weeks ago developed marked dyspnea, and hypoxemia, needing treatment with antibiotics and also steroid therapy for COPD/bronchial asthma exacerbation.  Symptoms of palpitations and chest tightness started about a month ago.  She still continues to do activities of daily living and has not had exertional chest pain.  She does feel symptoms improve by taking verapamil.    Past Medical History:  Diagnosis Date   Angina pectoris ([place])    Asthma    "as a child and as an adult" (08/02/2018)   CAD (coronary artery disease), native coronary artery 08/03/2018   Chronic bronchitis ([place])    Chronic kidney disease    Coronary artery disease    Family history of adverse reaction to anesthesia    "cousin stopped breathing; he was allergic to the anesthesia" (08/02/2018)   GERD (gastroesophageal reflux disease)    High cholesterol    History of gout    History of hiatal hernia    History of kidney stones    Hypertension 07/2018   Interstitial cystitis    NSVT (nonsustained ventricular  tachycardia) ([place]) 07/2018   Raynaud's disease    "feet and hands" (08/02/2018)   Raynaud's disease without gangrene 10/15/2018   Past Surgical History:  Procedure Laterality Date   ABDOMINAL HYSTERECTOMY     ANTERIOR CERVICAL DECOMP/DISCECTOMY FUSION     APPENDECTOMY     AUGMENTATION MAMMAPLASTY Bilateral    BACK SURGERY     CORONARY ANGIOPLASTY WITH STENT PLACEMENT  08/02/2018   CORONARY STENT INTERVENTION N/A 08/02/2018   Procedure: CORONARY STENT INTERVENTION;  Surgeon: Adrian Prows, MD;  Location: [facility];  Service: Cardiovascular;  Laterality: N/A;  RCA    CYSTOSCOPY W/ STONE MANIPULATION     INTRAVASCULAR PRESSURE WIRE/FFR STUDY N/A 10/07/2018   Procedure: INTRAVASCULAR PRESSURE WIRE/FFR STUDY;  Surgeon: Adrian Prows, MD;  Location: [facility];  Service: Cardiovascular;  Laterality: N/A;   KNEE ARTHROSCOPY Right    LAPAROSCOPIC CHOLECYSTECTOMY     LEFT HEART CATH AND CORONARY ANGIOGRAPHY N/A 10/07/2018   Procedure: LEFT HEART CATH AND CORONARY ANGIOGRAPHY;  Surgeon: Adrian Prows, MD;  Location: [facility];  Service: Cardiovascular;  Laterality: N/A;   REPAIR ANKLE LIGAMENT Left    "tied up ligament; had tore up qthing in my ankle"   RIGHT HEART CATH  10/07/2018   RIGHT/LEFT HEART CATH AND CORONARY ANGIOGRAPHY N/A 08/02/2018   Procedure: RIGHT/LEFT HEART CATH AND CORONARY ANGIOGRAPHY;  Surgeon: Adrian Prows, MD;  Location: [facility];  Service: Cardiovascular;  Laterality: N/A;   SHOULDER SURGERY     bilateral   TONSILLECTOMY     TUBAL LIGATION     Social History   Tobacco Use   Smoking status: Never   Smokeless tobacco: Never  Substance Use Topics   Alcohol use: Not Currently    Comment: 08/02/2018 "not since my 20's"   Marital Status: Married  ROS  Review of Systems  Constitutional: Positive for weight gain.  Cardiovascular:  Positive for chest pain and dyspnea on exertion. Negative for leg swelling.  Gastrointestinal:  Negative for  melena.  Psychiatric/Behavioral:  Positive for depression. The patient is nervous/anxious.   Objective  Blood pressure (!) 156/92, pulse 86, temperature 98 F (36.7 C), temperature source Temporal, resp. rate 17, height 5' 5"  (1.651 m), weight 192 lb (87.1 kg), SpO2 96 %.  Vitals with BMI 02/14/2021 02/14/2021 11/04/2020  Height - 5' 5"  5' 5"   Weight - 192 lbs 200 lbs  BMI - 34.91 79.15  Systolic 056 979 480  Diastolic 92 95 89  Pulse 86 96 90     Physical Exam Constitutional:      Appearance: She is well-developed. She is obese.  Cardiovascular:     Rate and Rhythm: Normal rate and regular rhythm.     Heart sounds: Normal heart sounds. No murmur heard.   No gallop.  Pulmonary:     Effort: Pulmonary effort is normal. No respiratory distress.  Abdominal:     General: Bowel sounds are normal.     Palpations: Abdomen is soft.   Laboratory examination:    Lipid Panel     Component Value Date/Time   CHOL 141 01/05/2020 0816   TRIG 86 01/05/2020 0816   HDL 61 01/05/2020 0816   LDLCALC 64 01/05/2020 0816    External labs:    Labs 06/21/2020:   Serum glucose 84, BUN 25, creatinine 0.94, EGFR 65 mill, potassium 4.5, CMP otherwise normal.  Hb 14.4/HCT 43.0, platelets 264.  Cholesterol, Total 174   100-199  Triglycerides 88   0-149  HDL Cholesterol 68   >39  VLDL Cholesterol Cal 16   5-40  LDL Chol Calc (NIH) 90   0-99  LDL/HDL Ratio 1.3   0.0-3.2     Lab 11/18/2019: BUN 26, creatinine 1.76, EGFR 56 mL, potassium 4.3.  CMP normal.  Total cholesterol 271, triglycerides 142, HDL 63, LDL 182.  A1c 5.2%.  Lab 12/22/2019: TSH normal, Hb 14.4/HCT 43.0, platelets 264   Lab 05/09/2019: Serum glucose 88, BUN 27, creatinine 1.2, EGFR 50.1/60.83.  Potassium 4.8.  Sodium 139.  CMP otherwise normal.   Total cholesterol 149, triglycerides 73, HDL 67, LDL 67, non-HDL cholesterol 82.  Medications and allergies   Allergies  Allergen Reactions   Adhesive [Tape] Other (See  Comments)    SKIN WILL TEAR EASILY!!!!   Statins Other (See Comments)    MYAILGIAS    Carvedilol    Diovan [Valsartan]     Tingling    Hydrochlorothiazide    Ibuprofen    Keflex [Cephalexin]     confusion   Metoclopramide Hcl     "Made me feel goofy"   Montelukast Sodium Diarrhea   Morphine And Related     hyper   Norvasc [Amlodipine Besylate]     Numbness and tingling    Nsaids     Esophagus became red and swollen (affected it)   Oxycodone Itching and Other (See Comments)    Welts, also (Tylox)   Oxycodone-Acetaminophen Itching and  Other (See Comments)    Welts, also   Paracetamol [Acetaminophen] Itching    Tylox = Welts, also   Plavix [Clopidogrel Bisulfate] Other (See Comments)    Red brusing   Prednisone     Elevated BP   Reglan [Metoclopramide]     confusion   Singulair [Montelukast] Diarrhea    Current Outpatient Medications on File Prior to Visit  Medication Sig Dispense Refill   aspirin EC 81 MG tablet Take 81 mg by mouth daily.     cyanocobalamin (,VITAMIN B-12,) 1000 MCG/ML injection Inject 1,000 mcg into the skin every 30 (thirty) days.     cyclobenzaprine (FLEXERIL) 10 MG tablet Take 10 mg by mouth at bedtime.     divalproex (DEPAKOTE ER) 250 MG 24 hr tablet Take 1 tablet (250 mg total) by mouth daily. 90 tablet 3   Evolocumab (REPATHA SURECLICK) 161 MG/ML SOAJ INJECT 140 MG INTO THE SKIN EVERY 2 WEEKS. 2 mL 6   ezetimibe (ZETIA) 10 MG tablet TAKE 1 TABLET BY MOUTH ONCE DAILY AFTER SUPPER. 30 tablet 0   hydrOXYzine (ATARAX/VISTARIL) 25 MG tablet Take 1 tablet by mouth daily as needed.     isosorbide mononitrate (IMDUR) 30 MG 24 hr tablet Take 1 tablet (30 mg total) by mouth daily at 12 noon. 90 tablet 3   labetalol (NORMODYNE) 100 MG tablet Take 2 tablets (200 mg total) by mouth 2 (two) times daily at 10 AM and 5 PM. 180 tablet 3   meloxicam (MOBIC) 15 MG tablet Take 15 mg by mouth daily.     nitroGLYCERIN (NITROSTAT) 0.4 MG SL tablet Place 1 tablet (0.4 mg  total) under the tongue every 5 (five) minutes as needed for chest pain. 30 tablet 2   oxybutynin (DITROPAN) 5 MG tablet Take 2.5 mg by mouth at bedtime.   1   pantoprazole (PROTONIX) 20 MG tablet TAKE 1 TABLET BY MOUTH ONCE A DAY. 30 tablet 6   triazolam (HALCION) 0.125 MG tablet Take 0.125 mg by mouth at bedtime.     Vitamin D, Ergocalciferol, (DRISDOL) 1.25 MG (50000 UT) CAPS capsule Take 50,000 Units by mouth every 7 (seven) days. Tuesdays     No current facility-administered medications on file prior to visit.    Radiology:   Chest X-Ray 01/09/2017: IMPRESSION: No active cardiopulmonary disease.  MR Angio Head WO Contrast 06/13/2019: IMPRESSION: 1. No evidence of ischemia on repeat diffusion imaging. 2. No abnormal enhancement following contrast. 3. Intracranial MRA is within normal limits.  EEG 07/05/19: Normal electroencephalogram, awake, asleep and with activation procedures. There are no focal lateralizing or epileptiform features.    Cardiac Studies:   Lexiscan Sestamibi stress test 07/22/2018:  1. Lexiscan stress test was performed. Exercise capacity was not assessed. Stress symptoms included chest pain, dyspnea, nausea, dizziness. Peak blood pressure was 148/98 mmHg. The resting and stress electrocardiogram demonstrated normal sinus rhythm, normal resting conduction, no resting arrhythmias and normal rest repolarization.  Stress EKG is non diagnostic for ischemia as it is a pharmacologic stress.  2. The overall quality of the study is good.  Left ventricular cavity is noted to be normal on the rest and stress studies.  Gated SPECT images reveal normal myocardial thickening and wall motion.  The left ventricular ejection fraction was calculated 45%, although visually appears normal. SPECT images reveal small area of mild intensity, reversible perfusion defect in apical inferolateral myocardium, that may represent ischemia.  3. Intermediate risk study due to small perfusion defect  and mildly  reduced LVEF. Recommend correlation with echocardiogram.   Echocardiogram 07/23/2018: Normal LV systolic function, grade 1 diastolic dysfunction, mildly calcified aortic leaflets, no significant valvular abnormality.  7 day event monitor 07/13/2018-07/19/2018: Dominant rhythm: Normal sinus rhythm.  11 patient activated events occurred with symptoms of chest pain, shortness of breath, rapid heart rate, correlating with sinus tachycardia.  Fastest tachycardia episode was day one at 1400 with 133 beats per without reported symptoms.  When autodetect event of sinus rhythm with 6 beats of NSVT on day 2 10:51 AM with symptoms of chest pain, shortness of breath.  No A. fib or SVT was noted.  Coronary angiogram 10/07/2018: No change from 08/02/2018 except D1 stenosis 50-60%, DFR negative. Normal right heart catheterization. The left ventricular systolic function is normal. LV end diastolic pressure is normal. Superdominant large RCA. Prox RCA to Mid RCA lesion is 80% stenosed S/P STENT ORSIRO DES 4.0X30 with 0% residual stenosis. Ost RPDA lesion is 50% stenosed. Small LAD ends before reaching apex.   EKG:  EKG 02/14/2021: Normal sinus rhythm at rate of 86 bpm, left axis deviation, cannot exclude inferior infarct old.  Incomplete right bundle branch block.  Poor R wave progression, cannot exclude anteroseptal infarct old.  Normal QT interval.  No significant change from EKG 10/18/2020.    Assessment     ICD-10-CM   1. Essential hypertension  I10 EKG 12-Lead    verapamil (CALAN-SR) 120 MG CR tablet    2. Coronary artery disease of native artery of native heart with stable angina pectoris ([place])  I25.118     3. Palpitations  R00.2 verapamil (CALAN-SR) 120 MG CR tablet    4. Pure hypercholesterolemia  E78.00       Meds ordered this encounter  Medications   verapamil (CALAN-SR) 120 MG CR tablet    Sig: Take 1 tablet (120 mg total) by mouth 2 (two) times daily.    Dispense:  180 tablet     Refill:  1    Medications Discontinued During This Encounter  Medication Reason   levalbuterol (XOPENEX) 0.63 MG/3ML nebulizer solution Error   verapamil (CALAN-SR) 120 MG CR tablet      Recommendations:   Tonya Boyd  is a 65 y.o. 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  She now presents here for follow-up of coronary artery disease and hyperlipidemia.  She had called our group on call regarding palpitations.  She has been worried that she may be having A. fib as she has a "smart watch" with EKG monitoring capability.  She is also worried about frequent episodes of chest pain.  States that about 2 to 3 weeks ago developed marked dyspnea, and hypoxemia, needing treatment with antibiotics and also steroid therapy for COPD/bronchial asthma exacerbation.  I reviewed her EKG tracings on her smart watch and essentially revealing artifact, no atrial fibrillation.  Her blood pressure is elevated today, probably related to recent steroid use.  I have advised her to increase verapamil 120 mg SR to twice daily dosing to see if her symptoms do get better as patient feels both her chest pain symptoms and palpitations improved by taking verapamil.  Her blood pressure is elevated today and hopefully this will also improve her blood pressure control.   Since being  on Repatha, lipids are very well controlled.  I would like to see her back in 6 weeks for follow-up of palpitations, atypical chest pain and hypertension.   Adrian Prows, MD, [facility] 02/14/2021, 3:07 PM Office: [phone] Pager: [phone]

## 2021-02-21 ENCOUNTER — Ambulatory Visit: Payer: Commercial Managed Care - PPO | Admitting: Cardiology

## 2021-03-08 ENCOUNTER — Other Ambulatory Visit: Payer: Self-pay | Admitting: Cardiology

## 2021-03-08 DIAGNOSIS — E7800 Pure hypercholesterolemia, unspecified: Secondary | ICD-10-CM

## 2021-03-27 ENCOUNTER — Ambulatory Visit: Payer: Commercial Managed Care - PPO | Admitting: Cardiology

## 2021-03-27 ENCOUNTER — Other Ambulatory Visit: Payer: Self-pay

## 2021-03-27 ENCOUNTER — Encounter: Payer: Self-pay | Admitting: Cardiology

## 2021-03-27 VITALS — BP 138/84 | HR 79 | Temp 97.7°F | Resp 17 | Ht 65.0 in | Wt 200.6 lb

## 2021-03-27 DIAGNOSIS — I10 Essential (primary) hypertension: Secondary | ICD-10-CM

## 2021-03-27 DIAGNOSIS — K7689 Other specified diseases of liver: Secondary | ICD-10-CM

## 2021-03-27 DIAGNOSIS — R002 Palpitations: Secondary | ICD-10-CM

## 2021-03-27 DIAGNOSIS — I25118 Atherosclerotic heart disease of native coronary artery with other forms of angina pectoris: Secondary | ICD-10-CM

## 2021-03-27 NOTE — Progress Notes (Signed)
Primary Physician/Referring:  Georgianne Fick, MD  Patient ID: Tonya Boyd, female    DOB: 1955-11-01, 65 y.o.   MRN: [id number]  Chief Complaint  Patient presents with   Coronary Artery Disease   Follow-up    6 weeks   Palpitations   HPI:    Tonya Boyd  is a 65 y.o. 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  She now presents here for follow-up of coronary artery disease and hyperlipidemia.  I did seen her 6 weeks ago for palpitations and chest pain.  Patient continues to have recurrent chest pain, states that even minimal activities around the house she often has chest pain and has to sit down.  She has not used nitroglycerin but states that chest pain symptoms are very similar to her anginal episodes previously.  Palpitations are stable, she has not had any significant palpitations and she has not been too worried about this.  She does feel symptoms improve by taking verapamil.    Past Medical History:  Diagnosis Date   Angina pectoris (HCC)    Asthma    "as a child and as an adult" (08/02/2018)   CAD (coronary artery disease), native coronary artery 08/03/2018   Chronic bronchitis (HCC)    Chronic kidney disease    Coronary artery disease    Family history of adverse reaction to anesthesia    "cousin stopped breathing; he was allergic to the anesthesia" (08/02/2018)   GERD (gastroesophageal reflux disease)    High cholesterol    History of gout    History of hiatal hernia    History of kidney stones    Hypertension 07/2018   Interstitial cystitis    NSVT (nonsustained ventricular tachycardia) (HCC) 07/2018   Raynaud's disease    "feet and hands" (08/02/2018)   Raynaud's disease  without gangrene 10/15/2018   Past Surgical History:  Procedure Laterality Date   ABDOMINAL HYSTERECTOMY     ANTERIOR CERVICAL DECOMP/DISCECTOMY FUSION     APPENDECTOMY     AUGMENTATION MAMMAPLASTY Bilateral    BACK SURGERY     CORONARY ANGIOPLASTY WITH STENT PLACEMENT  08/02/2018   CORONARY STENT INTERVENTION N/A 08/02/2018   Procedure: CORONARY STENT INTERVENTION;  Surgeon: [name], MD;  Location: [facility];  Service: Cardiovascular;  Laterality: N/A;  RCA    CYSTOSCOPY W/ STONE MANIPULATION     INTRAVASCULAR PRESSURE WIRE/FFR STUDY N/A 10/07/2018   Procedure: INTRAVASCULAR PRESSURE WIRE/FFR STUDY;  Surgeon: [name], MD;  Location: [facility];  Service: Cardiovascular;  Laterality: N/A;   KNEE ARTHROSCOPY Right    LAPAROSCOPIC CHOLECYSTECTOMY     LEFT HEART CATH AND CORONARY ANGIOGRAPHY N/A 10/07/2018   Procedure: LEFT HEART CATH AND CORONARY ANGIOGRAPHY;  Surgeon: [name], MD;  Location: [facility];  Service: Cardiovascular;  Laterality: N/A;   REPAIR ANKLE LIGAMENT Left    "tied up ligament; had tore up qthing in my ankle"   RIGHT HEART CATH  10/07/2018   RIGHT/LEFT HEART CATH AND CORONARY ANGIOGRAPHY N/A 08/02/2018   Procedure: RIGHT/LEFT HEART CATH AND CORONARY ANGIOGRAPHY;  Surgeon: [name], MD;  Location: [facility];  Service: Cardiovascular;  Laterality: N/A;   SHOULDER SURGERY     bilateral  TONSILLECTOMY     TUBAL LIGATION     Social History   Tobacco Use   Smoking status: Never   Smokeless tobacco: Never  Substance Use Topics   Alcohol use: Not Currently    Comment: 08/02/2018 "not since my 20's"   Marital Status: Married  ROS  Review of Systems  Constitutional: Positive for weight gain.  Cardiovascular:  Positive for chest pain and dyspnea on exertion. Negative for leg swelling.  Gastrointestinal:  Negative for melena.  Psychiatric/Behavioral:  Positive for depression. The patient is nervous/anxious.   Objective   Blood pressure 138/84, pulse 79, temperature 97.7 F (36.5 C), temperature source Temporal, resp. rate 17, height 5' 5" (1.651 m), weight 200 lb 9.6 oz (91 kg), SpO2 96 %.  Vitals with BMI 03/27/2021 03/27/2021 02/14/2021  Height - 5' 5" -  Weight - 200 lbs 10 oz -  BMI - 33.38 -  Systolic 138 140 156  Diastolic 84 72 92  Pulse 79 85 86     Physical Exam Constitutional:      Appearance: She is well-developed. She is obese.  Cardiovascular:     Rate and Rhythm: Normal rate and regular rhythm.     Heart sounds: Normal heart sounds. No murmur heard.   No gallop.  Pulmonary:     Effort: Pulmonary effort is normal. No respiratory distress.  Abdominal:     General: Bowel sounds are normal.     Palpations: Abdomen is soft.   Laboratory examination:    Lipid Panel     Component Value Date/Time   CHOL 141 01/05/2020 0816   TRIG 86 01/05/2020 0816   HDL 61 01/05/2020 0816   LDLCALC 64 01/05/2020 0816    External labs:   Hemoglobin 14.700 G/ 12/18/2020 Platelets 265.000 X1 12/18/2020  Creatinine, Serum 1.020 MG/ 12/18/2020 Potassium 4.000 mm 06/13/2019 ALT (SGPT) 18.000 IU/ 12/18/2020  TSH 2.100 12/18/2020  Medications and allergies   Allergies  Allergen Reactions   Adhesive [Tape] Other (See Comments)    SKIN WILL TEAR EASILY!!!!   Statins Other (See Comments)    MYAILGIAS    Carvedilol    Diovan [Valsartan]     Tingling    Hydrochlorothiazide    Ibuprofen    Keflex [Cephalexin]     confusion   Metoclopramide Hcl     "Made me feel goofy"   Montelukast Sodium Diarrhea   Morphine And Related     hyper   Norvasc [Amlodipine Besylate]     Numbness and tingling    Nsaids     Esophagus became red and swollen (affected it)   Oxycodone Itching and Other (See Comments)    Welts, also (Tylox)   Oxycodone-Acetaminophen Itching and Other (See Comments)    Welts, also   Paracetamol [Acetaminophen] Itching    Tylox = Welts, also   Plavix [Clopidogrel Bisulfate] Other (See  Comments)    Red brusing   Prednisone     Elevated BP   Reglan [Metoclopramide]     confusion   Singulair [Montelukast] Diarrhea    Current Outpatient Medications on File Prior to Visit  Medication Sig Dispense Refill   aspirin EC 81 MG tablet Take 81 mg by mouth daily.     cyanocobalamin (,VITAMIN B-12,) 1000 MCG/ML injection Inject 1,000 mcg into the skin every 30 (thirty) days.     cyclobenzaprine (FLEXERIL) 10 MG tablet Take 10 mg by mouth at bedtime.     divalproex (DEPAKOTE ER) 250 MG 24 hr tablet   Take 1 tablet (250 mg total) by mouth daily. 90 tablet 3   Evolocumab (REPATHA SURECLICK) 140 MG/ML SOAJ INJECT 140 MG INTO THE SKIN EVERY 2 WEEKS. 2 mL 6   ezetimibe (ZETIA) 10 MG tablet TAKE 1 TABLET BY MOUTH ONCE DAILY AFTER SUPPER. 30 tablet 0   hydrOXYzine (ATARAX/VISTARIL) 25 MG tablet Take 1 tablet by mouth daily as needed.     isosorbide mononitrate (IMDUR) 30 MG 24 hr tablet Take 1 tablet (30 mg total) by mouth daily at 12 noon. 90 tablet 3   labetalol (NORMODYNE) 100 MG tablet Take 2 tablets (200 mg total) by mouth 2 (two) times daily at 10 AM and 5 PM. 180 tablet 3   meloxicam (MOBIC) 15 MG tablet Take 15 mg by mouth daily.     nitroGLYCERIN (NITROSTAT) 0.4 MG SL tablet Place 1 tablet (0.4 mg total) under the tongue every 5 (five) minutes as needed for chest pain. 30 tablet 2   oxybutynin (DITROPAN) 5 MG tablet Take 2.5 mg by mouth at bedtime.   1   pantoprazole (PROTONIX) 20 MG tablet TAKE 1 TABLET BY MOUTH ONCE A DAY. 30 tablet 6   triazolam (HALCION) 0.125 MG tablet Take 0.125 mg by mouth at bedtime.     verapamil (CALAN-SR) 120 MG CR tablet Take 1 tablet (120 mg total) by mouth 2 (two) times daily. 180 tablet 1   Vitamin D, Ergocalciferol, (DRISDOL) 1.25 MG (50000 UT) CAPS capsule Take 50,000 Units by mouth every 7 (seven) days. Tuesdays     No current facility-administered medications on file prior to visit.    Radiology:   Chest X-Ray 01/09/2017: IMPRESSION: No active  cardiopulmonary disease.  MR Angio Head WO Contrast 06/13/2019: IMPRESSION: 1. No evidence of ischemia on repeat diffusion imaging. 2. No abnormal enhancement following contrast. 3. Intracranial MRA is within normal limits.  EEG 07/05/19: Normal electroencephalogram, awake, asleep and with activation procedures. There are no focal lateralizing or epileptiform features.    Cardiac Studies:   Lexiscan Sestamibi stress test 07/22/2018:  1. Lexiscan stress test was performed. Exercise capacity was not assessed. Stress symptoms included chest pain, dyspnea, nausea, dizziness. Peak blood pressure was 148/98 mmHg. The resting and stress electrocardiogram demonstrated normal sinus rhythm, normal resting conduction, no resting arrhythmias and normal rest repolarization.  Stress EKG is non diagnostic for ischemia as it is a pharmacologic stress.  2. The overall quality of the study is good.  Left ventricular cavity is noted to be normal on the rest and stress studies.  Gated SPECT images reveal normal myocardial thickening and wall motion.  The left ventricular ejection fraction was calculated 45%, although visually appears normal. SPECT images reveal small area of mild intensity, reversible perfusion defect in apical inferolateral myocardium, that may represent ischemia.  3. Intermediate risk study due to small perfusion defect and mildly reduced LVEF. Recommend correlation with echocardiogram.   7 day event monitor 07/13/2018-07/19/2018: Dominant rhythm: Normal sinus rhythm.  11 patient activated events occurred with symptoms of chest pain, shortness of breath, rapid heart rate, correlating with sinus tachycardia.  Fastest tachycardia episode was day one at 1400 with 133 beats per without reported symptoms.  When autodetect event of sinus rhythm with 6 beats of NSVT on day 2 10:51 AM with symptoms of chest pain, shortness of breath.  No A. fib or SVT was noted.  Coronary angiogram 10/07/2018: No change  from 08/02/2018 except D1 stenosis 50-60%, DFR negative. Normal right heart catheterization. The left ventricular systolic  function is normal. LV end diastolic pressure is normal. Superdominant large RCA. Prox RCA to Mid RCA lesion is 80% stenosed S/P STENT ORSIRO DES 4.0X30 with 0% residual stenosis. Ost RPDA lesion is 50% stenosed. Small LAD ends before reaching apex.  PCV ECHOCARDIOGRAM COMPLETE 11/01/2020 Left ventricle cavity is normal in size and wall thickness. Normal global wall motion. Normal LV systolic function with EF 55%. Doppler evidence of grade I (impaired) diastolic dysfunction, normal LAP. Calculated EF 55%. Left atrial cavity is normal in size. Aneurysmal interatrial septum without 2D or color Doppler evidence of shunting. Trileaflet aortic valve.  Trace aortic regurgitation. Mild (Grade I) mitral regurgitation. Normal right atrial pressure. 4X6 cm echolucent area seen in liver, likely s/o liver cyst. Consider dedicated liver ultrasound. Compared to previous study in 2014, liver finding is new. Correlates with MRI of the abdomen done 01/11/2019 revealing multiple liver cysts.      EKG:  EKG 02/14/2021: Normal sinus rhythm at rate of 86 bpm, left axis deviation, cannot exclude inferior infarct old.  Incomplete right bundle branch block.  Poor R wave progression, cannot exclude anteroseptal infarct old.  Normal QT interval.  No significant change from EKG 10/18/2020.    Assessment     ICD-10-CM   1. Coronary artery disease of native artery of native heart with stable angina pectoris (HCC)  I25.118 PCV MYOCARDIAL PERFUSION WITH LEXISCAN    2. Essential hypertension  I10     3. Palpitations  R00.2     4. Benign liver cyst  K76.89       No orders of the defined types were placed in this encounter.   There are no discontinued medications.    Recommendations:   Tonya Boyd  is a 65 y.o. 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  She now presents here for follow-up of coronary artery disease and hyperlipidemia.  Had seen her 6 weeks ago, for chest pain and also for palpitations, and chest pain patient has had several episodes of chest tightness since last office visit and states that she is very worried and reminds her of her prior angina pectoris.  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  Echocardiogram reviewed, she does have simple liver cysts that was confirmed by MRI.  No further evaluation is indicated.  Blood pressure is now well controlled.  She has not had any further palpitations.  Unless nuclear stress test is abnormal, I will see her back in 6 months.  If nuclear stress test is abnormal, I will refer her back to Dr. Elnoria Howard for GI evaluation.  [name], MD, [facility] 03/27/2021, 10:28 AM Office: [phone] Pager: [phone]

## 2021-03-27 NOTE — H&P (View-Only) (Signed)
Primary Physician/Referring:  Georgianne Fick, MD  Patient ID: Tonya Boyd, female    DOB: 1955-11-01, 65 y.o.   MRN: [id number]  Chief Complaint  Patient presents with   Coronary Artery Disease   Follow-up    6 weeks   Palpitations   HPI:    Tonya Boyd  is a 65 y.o. 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  She now presents here for follow-up of coronary artery disease and hyperlipidemia.  I did seen her 6 weeks ago for palpitations and chest pain.  Patient continues to have recurrent chest pain, states that even minimal activities around the house she often has chest pain and has to sit down.  She has not used nitroglycerin but states that chest pain symptoms are very similar to her anginal episodes previously.  Palpitations are stable, she has not had any significant palpitations and she has not been too worried about this.  She does feel symptoms improve by taking verapamil.    Past Medical History:  Diagnosis Date   Angina pectoris (HCC)    Asthma    "as a child and as an adult" (08/02/2018)   CAD (coronary artery disease), native coronary artery 08/03/2018   Chronic bronchitis (HCC)    Chronic kidney disease    Coronary artery disease    Family history of adverse reaction to anesthesia    "cousin stopped breathing; he was allergic to the anesthesia" (08/02/2018)   GERD (gastroesophageal reflux disease)    High cholesterol    History of gout    History of hiatal hernia    History of kidney stones    Hypertension 07/2018   Interstitial cystitis    NSVT (nonsustained ventricular tachycardia) (HCC) 07/2018   Raynaud's disease    "feet and hands" (08/02/2018)   Raynaud's disease  without gangrene 10/15/2018   Past Surgical History:  Procedure Laterality Date   ABDOMINAL HYSTERECTOMY     ANTERIOR CERVICAL DECOMP/DISCECTOMY FUSION     APPENDECTOMY     AUGMENTATION MAMMAPLASTY Bilateral    BACK SURGERY     CORONARY ANGIOPLASTY WITH STENT PLACEMENT  08/02/2018   CORONARY STENT INTERVENTION N/A 08/02/2018   Procedure: CORONARY STENT INTERVENTION;  Surgeon: [name], MD;  Location: [facility];  Service: Cardiovascular;  Laterality: N/A;  RCA    CYSTOSCOPY W/ STONE MANIPULATION     INTRAVASCULAR PRESSURE WIRE/FFR STUDY N/A 10/07/2018   Procedure: INTRAVASCULAR PRESSURE WIRE/FFR STUDY;  Surgeon: [name], MD;  Location: [facility];  Service: Cardiovascular;  Laterality: N/A;   KNEE ARTHROSCOPY Right    LAPAROSCOPIC CHOLECYSTECTOMY     LEFT HEART CATH AND CORONARY ANGIOGRAPHY N/A 10/07/2018   Procedure: LEFT HEART CATH AND CORONARY ANGIOGRAPHY;  Surgeon: [name], MD;  Location: [facility];  Service: Cardiovascular;  Laterality: N/A;   REPAIR ANKLE LIGAMENT Left    "tied up ligament; had tore up qthing in my ankle"   RIGHT HEART CATH  10/07/2018   RIGHT/LEFT HEART CATH AND CORONARY ANGIOGRAPHY N/A 08/02/2018   Procedure: RIGHT/LEFT HEART CATH AND CORONARY ANGIOGRAPHY;  Surgeon: [name], MD;  Location: [facility];  Service: Cardiovascular;  Laterality: N/A;   SHOULDER SURGERY     bilateral  TONSILLECTOMY     TUBAL LIGATION     Social History   Tobacco Use   Smoking status: Never   Smokeless tobacco: Never  Substance Use Topics   Alcohol use: Not Currently    Comment: 08/02/2018 "not since my 20's"   Marital Status: Married  ROS  Review of Systems  Constitutional: Positive for weight gain.  Cardiovascular:  Positive for chest pain and dyspnea on exertion. Negative for leg swelling.  Gastrointestinal:  Negative for melena.  Psychiatric/Behavioral:  Positive for depression. The patient is nervous/anxious.   Objective   Blood pressure 138/84, pulse 79, temperature 97.7 F (36.5 C), temperature source Temporal, resp. rate 17, height 5\' 5"  (1.651 m), weight 200 lb 9.6 oz (91 kg), SpO2 96 %.  Vitals with BMI 03/27/2021 03/27/2021 02/14/2021  Height - 5\' 5"  -  Weight - 200 lbs 10 oz -  BMI - 33.38 -  Systolic 138 140 [id number]  Diastolic 84 72 92  Pulse 79 85 86     Physical Exam Constitutional:      Appearance: She is well-developed. She is obese.  Cardiovascular:     Rate and Rhythm: Normal rate and regular rhythm.     Heart sounds: Normal heart sounds. No murmur heard.   No gallop.  Pulmonary:     Effort: Pulmonary effort is normal. No respiratory distress.  Abdominal:     General: Bowel sounds are normal.     Palpations: Abdomen is soft.   Laboratory examination:    Lipid Panel     Component Value Date/Time   CHOL 141 01/05/2020 0816   TRIG 86 01/05/2020 0816   HDL 61 01/05/2020 0816   LDLCALC 64 01/05/2020 0816    External labs:   Hemoglobin 14.700 G/ 12/18/2020 Platelets 265.000 X1 12/18/2020  Creatinine, Serum 1.020 MG/ 12/18/2020 Potassium 4.000 mm 06/13/2019 ALT (SGPT) 18.000 IU/ 12/18/2020  TSH 2.100 12/18/2020  Medications and allergies   Allergies  Allergen Reactions   Adhesive [Tape] Other (See Comments)    SKIN WILL TEAR EASILY!!!!   Statins Other (See Comments)    MYAILGIAS    Carvedilol    Diovan [Valsartan]     Tingling    Hydrochlorothiazide    Ibuprofen    Keflex [Cephalexin]     confusion   Metoclopramide Hcl     "Made me feel goofy"   Montelukast Sodium Diarrhea   Morphine And Related     hyper   Norvasc [Amlodipine Besylate]     Numbness and tingling    Nsaids     Esophagus became red and swollen (affected it)   Oxycodone Itching and Other (See Comments)    Welts, also (Tylox)   Oxycodone-Acetaminophen Itching and Other (See Comments)    Welts, also   Paracetamol [Acetaminophen] Itching    Tylox = Welts, also   Plavix [Clopidogrel Bisulfate] Other (See  Comments)    Red brusing   Prednisone     Elevated BP   Reglan [Metoclopramide]     confusion   Singulair [Montelukast] Diarrhea    Current Outpatient Medications on File Prior to Visit  Medication Sig Dispense Refill   aspirin EC 81 MG tablet Take 81 mg by mouth daily.     cyanocobalamin (,VITAMIN B-12,) 1000 MCG/ML injection Inject 1,000 mcg into the skin every 30 (thirty) days.     cyclobenzaprine (FLEXERIL) 10 MG tablet Take 10 mg by mouth at bedtime.     divalproex (DEPAKOTE ER) 250 MG 24 hr tablet  Take 1 tablet (250 mg total) by mouth daily. 90 tablet 3   Evolocumab (REPATHA SURECLICK) 140 MG/ML SOAJ INJECT 140 MG INTO THE SKIN EVERY 2 WEEKS. 2 mL 6   ezetimibe (ZETIA) 10 MG tablet TAKE 1 TABLET BY MOUTH ONCE DAILY AFTER SUPPER. 30 tablet 0   hydrOXYzine (ATARAX/VISTARIL) 25 MG tablet Take 1 tablet by mouth daily as needed.     isosorbide mononitrate (IMDUR) 30 MG 24 hr tablet Take 1 tablet (30 mg total) by mouth daily at 12 noon. 90 tablet 3   labetalol (NORMODYNE) 100 MG tablet Take 2 tablets (200 mg total) by mouth 2 (two) times daily at 10 AM and 5 PM. 180 tablet 3   meloxicam (MOBIC) 15 MG tablet Take 15 mg by mouth daily.     nitroGLYCERIN (NITROSTAT) 0.4 MG SL tablet Place 1 tablet (0.4 mg total) under the tongue every 5 (five) minutes as needed for chest pain. 30 tablet 2   oxybutynin (DITROPAN) 5 MG tablet Take 2.5 mg by mouth at bedtime.   1   pantoprazole (PROTONIX) 20 MG tablet TAKE 1 TABLET BY MOUTH ONCE A DAY. 30 tablet 6   triazolam (HALCION) 0.125 MG tablet Take 0.125 mg by mouth at bedtime.     verapamil (CALAN-SR) 120 MG CR tablet Take 1 tablet (120 mg total) by mouth 2 (two) times daily. 180 tablet 1   Vitamin D, Ergocalciferol, (DRISDOL) 1.25 MG (50000 UT) CAPS capsule Take 50,000 Units by mouth every 7 (seven) days. Tuesdays     No current facility-administered medications on file prior to visit.    Radiology:   Chest X-Ray 01/09/2017: IMPRESSION: No active  cardiopulmonary disease.  MR Angio Head WO Contrast 06/13/2019: IMPRESSION: 1. No evidence of ischemia on repeat diffusion imaging. 2. No abnormal enhancement following contrast. 3. Intracranial MRA is within normal limits.  EEG 07/05/19: Normal electroencephalogram, awake, asleep and with activation procedures. There are no focal lateralizing or epileptiform features.    Cardiac Studies:   Lexiscan Sestamibi stress test 07/22/2018:  1. Lexiscan stress test was performed. Exercise capacity was not assessed. Stress symptoms included chest pain, dyspnea, nausea, dizziness. Peak blood pressure was 148/98 mmHg. The resting and stress electrocardiogram demonstrated normal sinus rhythm, normal resting conduction, no resting arrhythmias and normal rest repolarization.  Stress EKG is non diagnostic for ischemia as it is a pharmacologic stress.  2. The overall quality of the study is good.  Left ventricular cavity is noted to be normal on the rest and stress studies.  Gated SPECT images reveal normal myocardial thickening and wall motion.  The left ventricular ejection fraction was calculated 45%, although visually appears normal. SPECT images reveal small area of mild intensity, reversible perfusion defect in apical inferolateral myocardium, that may represent ischemia.  3. Intermediate risk study due to small perfusion defect and mildly reduced LVEF. Recommend correlation with echocardiogram.   7 day event monitor 07/13/2018-07/19/2018: Dominant rhythm: Normal sinus rhythm.  11 patient activated events occurred with symptoms of chest pain, shortness of breath, rapid heart rate, correlating with sinus tachycardia.  Fastest tachycardia episode was day one at 1400 with 133 beats per without reported symptoms.  When autodetect event of sinus rhythm with 6 beats of NSVT on day 2 10:51 AM with symptoms of chest pain, shortness of breath.  No A. fib or SVT was noted.  Coronary angiogram 10/07/2018: No change  from 08/02/2018 except D1 stenosis 50-60%, DFR negative. Normal right heart catheterization. The left ventricular systolic  function is normal. LV end diastolic pressure is normal. Superdominant large RCA. Prox RCA to Mid RCA lesion is 80% stenosed S/P STENT ORSIRO DES 4.0X30 with 0% residual stenosis. Ost RPDA lesion is 50% stenosed. Small LAD ends before reaching apex.  PCV ECHOCARDIOGRAM COMPLETE 11/01/2020 Left ventricle cavity is normal in size and wall thickness. Normal global wall motion. Normal LV systolic function with EF 55%. Doppler evidence of grade I (impaired) diastolic dysfunction, normal LAP. Calculated EF 55%. Left atrial cavity is normal in size. Aneurysmal interatrial septum without 2D or color Doppler evidence of shunting. Trileaflet aortic valve.  Trace aortic regurgitation. Mild (Grade I) mitral regurgitation. Normal right atrial pressure. 4X6 cm echolucent area seen in liver, likely s/o liver cyst. Consider dedicated liver ultrasound. Compared to previous study in 2014, liver finding is new. Correlates with MRI of the abdomen done 01/11/2019 revealing multiple liver cysts.      EKG:  EKG 02/14/2021: Normal sinus rhythm at rate of 86 bpm, left axis deviation, cannot exclude inferior infarct old.  Incomplete right bundle branch block.  Poor R wave progression, cannot exclude anteroseptal infarct old.  Normal QT interval.  No significant change from EKG 10/18/2020.    Assessment     ICD-10-CM   1. Coronary artery disease of native artery of native heart with stable angina pectoris (HCC)  I25.118 PCV MYOCARDIAL PERFUSION WITH LEXISCAN    2. Essential hypertension  I10     3. Palpitations  R00.2     4. Benign liver cyst  K76.89       No orders of the defined types were placed in this encounter.   There are no discontinued medications.    Recommendations:   Tonya Boyd  is a 65 y.o. 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  She now presents here for follow-up of coronary artery disease and hyperlipidemia.  Had seen her 6 weeks ago, for chest pain and also for palpitations, and chest pain patient has had several episodes of chest tightness since last office visit and states that she is very worried and reminds her of her prior angina pectoris.  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  Echocardiogram reviewed, she does have simple liver cysts that was confirmed by MRI.  No further evaluation is indicated.  Blood pressure is now well controlled.  She has not had any further palpitations.  Unless nuclear stress test is abnormal, I will see her back in 6 months.  If nuclear stress test is abnormal, I will refer her back to Dr. Elnoria Howard for GI evaluation.  [name], MD, [facility] 03/27/2021, 10:28 AM Office: [phone] Pager: [phone]

## 2021-03-31 ENCOUNTER — Ambulatory Visit: Payer: Commercial Managed Care - PPO

## 2021-03-31 ENCOUNTER — Other Ambulatory Visit: Payer: Self-pay

## 2021-03-31 DIAGNOSIS — I25118 Atherosclerotic heart disease of native coronary artery with other forms of angina pectoris: Secondary | ICD-10-CM

## 2021-04-01 NOTE — Progress Notes (Signed)
Please inform the stress test is high risk and she will need coronary angiogram as discussed. Place CBC and BMP order and LCP

## 2021-04-02 ENCOUNTER — Telehealth: Payer: Self-pay

## 2021-04-02 ENCOUNTER — Other Ambulatory Visit: Payer: Self-pay

## 2021-04-02 DIAGNOSIS — I10 Essential (primary) hypertension: Secondary | ICD-10-CM

## 2021-04-02 DIAGNOSIS — R0600 Dyspnea, unspecified: Secondary | ICD-10-CM

## 2021-04-02 DIAGNOSIS — I25118 Atherosclerotic heart disease of native coronary artery with other forms of angina pectoris: Secondary | ICD-10-CM

## 2021-04-02 DIAGNOSIS — R0609 Other forms of dyspnea: Secondary | ICD-10-CM

## 2021-04-02 NOTE — Telephone Encounter (Signed)
done

## 2021-04-02 NOTE — Telephone Encounter (Signed)
I called the patient personally, I reviewed the results of the stress test again with the patient.  Patient has symptomatic angina pectoris and initially the procedure was scheduled for sometime in middle of August that is 2 weeks from now.  I have reassured the patient, which will be done within the next few days, to have the labs done including BMP and CBC for the procedure.  Patient is not having any rest pain but has to use nitroglycerin with activity and symptoms are suggestive of more accelerated angina than unstable angina pectoris.  Patient is aware to go to the emergency room if she has rest pain or if pain is not relieved with sublingual nitroglycerin.  Schedule for cardiac catheterization, and possible angioplasty. We discussed regarding risks, benefits, alternatives to this including stress testing, CTA and continued medical therapy. Patient wants to proceed. Understands <1-2% risk of death, stroke, MI, urgent CABG, bleeding, infection, renal failure but not limited to these.    [name], MD, [facility] 04/02/2021, 6:19 PM Office: [phone] Fax: [phone] Pager: [phone]

## 2021-04-04 LAB — BASIC METABOLIC PANEL
BUN/Creatinine Ratio: 24 (ref 12–28)
BUN: 25 mg/dL (ref 8–27)
CO2: 24 mmol/L (ref 20–29)
Calcium: 9.9 mg/dL (ref 8.7–10.3)
Chloride: 101 mmol/L (ref 96–106)
Creatinine, Ser: 1.05 mg/dL — ABNORMAL HIGH (ref 0.57–1.00)
Glucose: 75 mg/dL (ref 65–99)
Potassium: 4.8 mmol/L (ref 3.5–5.2)
Sodium: 140 mmol/L (ref 134–144)
eGFR: 59 mL/min/{1.73_m2} — ABNORMAL LOW (ref >59)

## 2021-04-04 LAB — CBC WITH DIFFERENTIAL/PLATELET
Basophils Absolute: 0.1 10*3/uL (ref 0.0–0.2)
Basos: 1 %
EOS (ABSOLUTE): 0.2 10*3/uL (ref 0.0–0.4)
Eos: 1 %
Hematocrit: 41.3 % (ref 34.0–46.6)
Hemoglobin: 13.9 g/dL (ref 11.1–15.9)
Immature Grans (Abs): 0 10*3/uL (ref 0.0–0.1)
Immature Granulocytes: 0 %
Lymphocytes Absolute: 1.6 10*3/uL (ref 0.7–3.1)
Lymphs: 12 %
MCH: 31.4 pg (ref 26.6–33.0)
MCHC: 33.7 g/dL (ref 31.5–35.7)
MCV: 93 fL (ref 79–97)
Monocytes Absolute: 1.1 10*3/uL — ABNORMAL HIGH (ref 0.1–0.9)
Monocytes: 9 %
Neutrophils Absolute: 9.8 10*3/uL — ABNORMAL HIGH (ref 1.4–7.0)
Neutrophils: 77 %
Platelets: 265 10*3/uL (ref 150–450)
RBC: 4.42 x10E6/uL (ref 3.77–5.28)
RDW: 12.8 % (ref 11.7–15.4)
WBC: 12.8 10*3/uL — ABNORMAL HIGH (ref 3.4–10.8)

## 2021-04-08 ENCOUNTER — Encounter (HOSPITAL_COMMUNITY): Payer: Self-pay | Admitting: Cardiology

## 2021-04-08 ENCOUNTER — Ambulatory Visit (HOSPITAL_COMMUNITY)
Admission: RE | Admit: 2021-04-08 | Discharge: 2021-04-08 | Disposition: A | Discharge status: Home / Self Care | Payer: Commercial Managed Care - PPO | Attending: Cardiology | Admitting: Cardiology

## 2021-04-08 ENCOUNTER — Other Ambulatory Visit (HOSPITAL_COMMUNITY): Payer: Self-pay

## 2021-04-08 ENCOUNTER — Encounter (HOSPITAL_COMMUNITY)
Admission: RE | Disposition: A | Discharge status: Home / Self Care | Payer: Self-pay | Source: Home / Self Care | Attending: Cardiology

## 2021-04-08 ENCOUNTER — Other Ambulatory Visit: Payer: Self-pay

## 2021-04-08 DIAGNOSIS — I10 Essential (primary) hypertension: Secondary | ICD-10-CM | POA: Insufficient documentation

## 2021-04-08 DIAGNOSIS — Z955 Presence of coronary angioplasty implant and graft: Secondary | ICD-10-CM | POA: Diagnosis not present

## 2021-04-08 DIAGNOSIS — R002 Palpitations: Secondary | ICD-10-CM | POA: Diagnosis not present

## 2021-04-08 DIAGNOSIS — Z881 Allergy status to other antibiotic agents status: Secondary | ICD-10-CM | POA: Insufficient documentation

## 2021-04-08 DIAGNOSIS — I209 Angina pectoris, unspecified: Secondary | ICD-10-CM | POA: Diagnosis present

## 2021-04-08 DIAGNOSIS — Z885 Allergy status to narcotic agent status: Secondary | ICD-10-CM | POA: Insufficient documentation

## 2021-04-08 DIAGNOSIS — Z7982 Long term (current) use of aspirin: Secondary | ICD-10-CM | POA: Insufficient documentation

## 2021-04-08 DIAGNOSIS — R0602 Shortness of breath: Secondary | ICD-10-CM | POA: Diagnosis not present

## 2021-04-08 DIAGNOSIS — Z79899 Other long term (current) drug therapy: Secondary | ICD-10-CM | POA: Insufficient documentation

## 2021-04-08 DIAGNOSIS — I25118 Atherosclerotic heart disease of native coronary artery with other forms of angina pectoris: Secondary | ICD-10-CM | POA: Insufficient documentation

## 2021-04-08 DIAGNOSIS — K7689 Other specified diseases of liver: Secondary | ICD-10-CM | POA: Insufficient documentation

## 2021-04-08 DIAGNOSIS — Z886 Allergy status to analgesic agent status: Secondary | ICD-10-CM | POA: Diagnosis not present

## 2021-04-08 DIAGNOSIS — Z888 Allergy status to other drugs, medicaments and biological substances status: Secondary | ICD-10-CM | POA: Diagnosis not present

## 2021-04-08 HISTORY — PX: LEFT HEART CATH AND CORONARY ANGIOGRAPHY: CATH118249

## 2021-04-08 HISTORY — PX: CORONARY STENT INTERVENTION: CATH118234

## 2021-04-08 LAB — POCT ACTIVATED CLOTTING TIME
Activated Clotting Time: 277 seconds
Activated Clotting Time: 289 seconds

## 2021-04-08 SURGERY — LEFT HEART CATH AND CORONARY ANGIOGRAPHY
Anesthesia: LOCAL

## 2021-04-08 MED ORDER — HEPARIN SODIUM (PORCINE) 1000 UNIT/ML IJ SOLN
INTRAMUSCULAR | Status: AC
  Filled 2021-04-08: qty 1

## 2021-04-08 MED ORDER — SODIUM CHLORIDE 0.9 % WEIGHT BASED INFUSION: 1.0000 mL/kg/h | INTRAVENOUS | Status: DC

## 2021-04-08 MED ORDER — ACETAMINOPHEN 325 MG PO TABS: 650.0000 mg | ORAL_TABLET | ORAL | Status: DC | PRN

## 2021-04-08 MED ORDER — MIDAZOLAM HCL 2 MG/2ML IJ SOLN
INTRAMUSCULAR | Status: DC | PRN
  Administered 2021-04-08 (×4): 1 mg via INTRAVENOUS

## 2021-04-08 MED ORDER — FENTANYL CITRATE (PF) 100 MCG/2ML IJ SOLN
INTRAMUSCULAR | Status: AC
  Filled 2021-04-08: qty 2

## 2021-04-08 MED ORDER — LIDOCAINE HCL (PF) 1 % IJ SOLN
INTRAMUSCULAR | Status: AC
  Filled 2021-04-08: qty 30

## 2021-04-08 MED ORDER — ASPIRIN 81 MG PO CHEW
CHEWABLE_TABLET | ORAL | Status: AC
  Administered 2021-04-08: 81 mg via ORAL
  Filled 2021-04-08: qty 1

## 2021-04-08 MED ORDER — IOHEXOL 350 MG/ML SOLN
INTRAVENOUS | Status: DC | PRN
  Administered 2021-04-08: 100 mL

## 2021-04-08 MED ORDER — VERAPAMIL HCL 2.5 MG/ML IV SOLN
INTRAVENOUS | Status: DC | PRN
  Administered 2021-04-08: 5 mL via INTRA_ARTERIAL

## 2021-04-08 MED ORDER — SODIUM CHLORIDE 0.9 % IV SOLN: 250.0000 mL | INTRAVENOUS | Status: DC | PRN

## 2021-04-08 MED ORDER — TICAGRELOR 90 MG PO TABS
ORAL_TABLET | ORAL | Status: AC
  Filled 2021-04-08: qty 2

## 2021-04-08 MED ORDER — ASPIRIN 81 MG PO CHEW: 81.0000 mg | CHEWABLE_TABLET | ORAL | Status: AC

## 2021-04-08 MED ORDER — ONDANSETRON HCL 4 MG/2ML IJ SOLN: 4.0000 mg | Freq: Four times a day (QID) | INTRAMUSCULAR | Status: DC | PRN

## 2021-04-08 MED ORDER — MIDAZOLAM HCL 2 MG/2ML IJ SOLN
INTRAMUSCULAR | Status: AC
  Filled 2021-04-08: qty 2

## 2021-04-08 MED ORDER — SODIUM CHLORIDE 0.9% FLUSH: 3.0000 mL | Freq: Two times a day (BID) | INTRAVENOUS | Status: DC

## 2021-04-08 MED ORDER — LABETALOL HCL 100 MG PO TABS
100.0000 mg | ORAL_TABLET | Freq: Two times a day (BID) | ORAL | Status: DC
Start: 2021-04-08 — End: 2021-05-20

## 2021-04-08 MED ORDER — HEPARIN SODIUM (PORCINE) 1000 UNIT/ML IJ SOLN
INTRAMUSCULAR | Status: DC | PRN
  Administered 2021-04-08: 5000 [IU] via INTRAVENOUS
  Administered 2021-04-08: 4000 [IU] via INTRAVENOUS
  Administered 2021-04-08: 2000 [IU] via INTRAVENOUS

## 2021-04-08 MED ORDER — HEPARIN (PORCINE) IN NACL 1000-0.9 UT/500ML-% IV SOLN
INTRAVENOUS | Status: DC | PRN
Start: 2021-04-08 — End: 2021-04-08
  Administered 2021-04-08: 500 mL

## 2021-04-08 MED ORDER — NITROGLYCERIN 1 MG/10 ML FOR IR/CATH LAB
INTRA_ARTERIAL | Status: DC | PRN
  Administered 2021-04-08: 200 ug via INTRACORONARY

## 2021-04-08 MED ORDER — SODIUM CHLORIDE 0.9% FLUSH: 3.0000 mL | INTRAVENOUS | Status: DC | PRN

## 2021-04-08 MED ORDER — HEPARIN (PORCINE) IN NACL 1000-0.9 UT/500ML-% IV SOLN
INTRAVENOUS | Status: DC | PRN
  Administered 2021-04-08: 500 mL

## 2021-04-08 MED ORDER — FENTANYL CITRATE (PF) 100 MCG/2ML IJ SOLN
INTRAMUSCULAR | Status: DC | PRN
  Administered 2021-04-08: 50 ug via INTRAVENOUS
  Administered 2021-04-08 (×4): 25 ug via INTRAVENOUS

## 2021-04-08 MED ORDER — DIPHENHYDRAMINE HCL 50 MG/ML IJ SOLN
INTRAMUSCULAR | Status: AC
  Filled 2021-04-08: qty 1

## 2021-04-08 MED ORDER — ANGIOPLASTY BOOK
Status: AC
  Filled 2021-04-08: qty 1

## 2021-04-08 MED ORDER — SODIUM CHLORIDE 0.9 % WEIGHT BASED INFUSION
3.0000 mL/kg/h | INTRAVENOUS | Status: AC
  Administered 2021-04-08: 3 mL/kg/h via INTRAVENOUS

## 2021-04-08 MED ORDER — HEPARIN (PORCINE) IN NACL 1000-0.9 UT/500ML-% IV SOLN
INTRAVENOUS | Status: AC
  Filled 2021-04-08: qty 1000

## 2021-04-08 MED ORDER — DIPHENHYDRAMINE HCL 50 MG/ML IJ SOLN
12.5000 mg | Freq: Once | INTRAMUSCULAR | Status: AC
  Administered 2021-04-08: 12.5 mg via INTRAVENOUS

## 2021-04-08 MED ORDER — VERAPAMIL HCL 2.5 MG/ML IV SOLN
INTRAVENOUS | Status: AC
  Filled 2021-04-08: qty 2

## 2021-04-08 MED ORDER — LIDOCAINE HCL (PF) 1 % IJ SOLN
INTRAMUSCULAR | Status: DC | PRN
  Administered 2021-04-08: 3 mL

## 2021-04-08 MED ORDER — TICAGRELOR 90 MG PO TABS
90.0000 mg | ORAL_TABLET | Freq: Two times a day (BID) | ORAL | 5 refills | Status: DC
  Filled 2021-04-08: qty 60, 30d supply, fill #0

## 2021-04-08 MED ORDER — NITROGLYCERIN 1 MG/10 ML FOR IR/CATH LAB
INTRA_ARTERIAL | Status: AC
  Filled 2021-04-08: qty 10

## 2021-04-08 MED ORDER — TICAGRELOR 90 MG PO TABS
ORAL_TABLET | ORAL | Status: DC | PRN
  Administered 2021-04-08: 180 mg via ORAL

## 2021-04-08 SURGICAL SUPPLY — 18 items
BALLN SAPPHIRE ~~LOC~~ 4.0X8 (BALLOONS) ×2 IMPLANT
BALLN SCOREFLEX 2.50X10 (BALLOONS) ×2
BALLOON SCOREFLEX 2.50X10 (BALLOONS) ×1 IMPLANT
CATH LAUNCHER 6FR JR4 (CATHETERS) ×2 IMPLANT
CATH OPTITORQUE TIG 4.0 5F (CATHETERS) ×2 IMPLANT
GLIDESHEATH SLEND A-KIT 6F 22G (SHEATH) ×2 IMPLANT
GUIDEWIRE INQWIRE 1.5J.035X260 (WIRE) ×1 IMPLANT
INQWIRE 1.5J .035X260CM (WIRE) ×2
KIT ENCORE 26 ADVANTAGE (KITS) ×2 IMPLANT
KIT HEART LEFT (KITS) ×2 IMPLANT
PACK CARDIAC CATHETERIZATION (CUSTOM PROCEDURE TRAY) ×2 IMPLANT
STENT SYNERGY XD 2.50X12 (Permanent Stent) ×1 IMPLANT
STENT SYNERGY XD 4.0X16 (Permanent Stent) ×1 IMPLANT
SYNERGY XD 2.50X12 (Permanent Stent) ×2 IMPLANT
SYNERGY XD 4.0X16 (Permanent Stent) ×2 IMPLANT
TRANSDUCER W/STOPCOCK (MISCELLANEOUS) ×2 IMPLANT
TUBING CIL FLEX 10 FLL-RA (TUBING) ×2 IMPLANT
WIRE COUGAR XT STRL 190CM (WIRE) ×2 IMPLANT

## 2021-04-08 NOTE — Discharge Instructions (Signed)

## 2021-04-08 NOTE — Interval H&P Note (Signed)
History and Physical Interval Note:  04/08/2021 9:03 AM  Gilford Rile Sedlacek  has presented today for surgery, with the diagnosis of shortness of breath.  The various methods of treatment have been discussed with the patient and family. After consideration of risks, benefits and other options for treatment, the patient has consented to  Procedure(s): LEFT HEART CATH AND CORONARY ANGIOGRAPHY (N/A) and possible angioplasty as a surgical intervention.  The patient's history has been reviewed, patient examined, no change in status, stable for surgery.  I have reviewed the patient's chart and labs.  Questions were answered to the patient's satisfaction.    Cath Lab Visit (complete for each Cath Lab visit)  Clinical Evaluation Leading to the Procedure:   ACS: No.  Non-ACS:    Anginal Classification: CCS III  Anti-ischemic medical therapy: Maximal Therapy (2 or more classes of medications)  Non-Invasive Test Results: High-risk stress test findings: cardiac mortality >3%/year  Prior CABG: No previous CABG        [name]

## 2021-04-08 NOTE — Progress Notes (Signed)
Pt ambulated without difficulty or bleeding.   Discharged home with her husband who will drive and stay with pt x 24 hrs. 

## 2021-04-08 NOTE — Progress Notes (Signed)
CARDIAC REHAB PHASE I   Stent education completed with pt and spouse. Pt educated on importance of ASA and Brilinta. Pt given stent card along with heart healthy diet. Reviewed site care, restrictions, and exercise guidelines with emphasis on safety. Will refer to CRP II [place].  [id number] Reynold Bowen, RN BSN 04/08/2021 11:27 AM'

## 2021-04-08 NOTE — Progress Notes (Signed)
Client c/o itching and Dr Jacinto Halim notified and benadryl given as ordered and relieved itching

## 2021-04-11 ENCOUNTER — Other Ambulatory Visit: Payer: Self-pay | Admitting: Cardiology

## 2021-04-11 ENCOUNTER — Other Ambulatory Visit (HOSPITAL_COMMUNITY): Payer: Self-pay

## 2021-04-11 DIAGNOSIS — E7800 Pure hypercholesterolemia, unspecified: Secondary | ICD-10-CM

## 2021-04-14 ENCOUNTER — Telehealth: Payer: Self-pay | Admitting: Cardiology

## 2021-04-14 NOTE — Telephone Encounter (Signed)
Pt experiencing R side soreness on chest, some weakness and fatigue after getting cath put in. Would like to discuss with medical assistant.

## 2021-04-14 NOTE — Telephone Encounter (Signed)
Pt gets right sided chest pain only when she stands up and walks around. It is not a sharp pain, just soreness. Pt feels very tired and fatigued. Yesterday her BP was 130/90. She would like to know if this is normal after getting a cath done. Please advise.

## 2021-04-15 NOTE — Telephone Encounter (Signed)
*  8/9 Pt called back to inquire if this is normal, what's going on with the symptoms she's having, etc.  Pt experiencing R side soreness on chest, some weakness and fatigue after getting cath put in. Would like to discuss with medical assistant.

## 2021-04-15 NOTE — Telephone Encounter (Signed)
Attempted to get a response from Dr. Jacinto Halim. Pt gets right sided chest pain only when she stands up and walks around. It is not a sharp pain, just soreness. Pt feels very tired and fatigued. Yesterday her BP was 130/90. She would like to know if this is normal after getting a cath done. Please advise.

## 2021-04-16 ENCOUNTER — Ambulatory Visit: Payer: Commercial Managed Care - PPO | Admitting: Student

## 2021-04-16 ENCOUNTER — Encounter: Payer: Self-pay | Admitting: Student

## 2021-04-16 ENCOUNTER — Other Ambulatory Visit: Payer: Self-pay

## 2021-04-16 VITALS — BP 124/77 | HR 86 | Temp 97.6°F | Resp 16 | Ht 64.0 in | Wt 197.0 lb

## 2021-04-16 DIAGNOSIS — R0609 Other forms of dyspnea: Secondary | ICD-10-CM

## 2021-04-16 DIAGNOSIS — R0600 Dyspnea, unspecified: Secondary | ICD-10-CM

## 2021-04-16 DIAGNOSIS — I10 Essential (primary) hypertension: Secondary | ICD-10-CM

## 2021-04-16 DIAGNOSIS — I25118 Atherosclerotic heart disease of native coronary artery with other forms of angina pectoris: Secondary | ICD-10-CM

## 2021-04-16 MED ORDER — CLOPIDOGREL BISULFATE 75 MG PO TABS: 75.0000 mg | ORAL_TABLET | Freq: Every day | ORAL | 3 refills | Status: DC

## 2021-04-16 NOTE — Progress Notes (Signed)
Primary Physician/Referring:  Georgianne Fick, MD  Patient ID: Tonya Boyd, female    DOB: [birth year], 65 y.o.   MRN: [id number]  Chief Complaint  Patient presents with   Chest Pain   Dizziness   Hypertension   Follow-up   HPI:    Tonya Boyd  is a 65 y.o. 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  Due to ongoing angina symptoms at last visit patient underwent repeat coronary angiography with successful stenting to proximal RCA and ostial PDA.   Patient now presents for urgent visit with complaints of left-sided chest pain intermittent and associated with shortness of breath since cardiac catheterization.  She notes that she does not have the symptoms in the morning when she first wakes up.  Symptoms seem to worsen throughout the day.  Denies palpitations, syncope, near syncope.  She has not taken sublingual nitroglycerin.  Past Medical History:  Diagnosis Date   Angina pectoris (HCC)    Asthma    "as a child and as an adult" (08/02/2018)   CAD (coronary artery disease), native coronary artery 08/03/2018   Chronic bronchitis (HCC)    Chronic kidney disease    Coronary artery disease    Family history of adverse reaction to anesthesia    "cousin stopped breathing; he was allergic to the anesthesia" (08/02/2018)   GERD (gastroesophageal reflux disease)    High cholesterol    History of gout    History of hiatal hernia    History of kidney stones    Hypertension 07/2018   Interstitial cystitis    NSVT (nonsustained ventricular tachycardia) (HCC) 07/2018   Raynaud's disease    "feet and hands" (08/02/2018)   Raynaud's disease without gangrene 10/15/2018   Past Surgical History:  Procedure  Laterality Date   ABDOMINAL HYSTERECTOMY     ANTERIOR CERVICAL DECOMP/DISCECTOMY FUSION     APPENDECTOMY     AUGMENTATION MAMMAPLASTY Bilateral    BACK SURGERY     CORONARY ANGIOPLASTY WITH STENT PLACEMENT  08/02/2018   CORONARY STENT INTERVENTION N/A 08/02/2018   Procedure: CORONARY STENT INTERVENTION;  Surgeon: [name], MD;  Location: [facility];  Service: Cardiovascular;  Laterality: N/A;  RCA    CORONARY STENT INTERVENTION N/A 04/08/2021   Procedure: CORONARY STENT INTERVENTION;  Surgeon: [name], MD;  Location: [facility];  Service: Cardiovascular;  Laterality: N/A;  RCA and PDA   CYSTOSCOPY W/ STONE MANIPULATION     INTRAVASCULAR PRESSURE WIRE/FFR STUDY N/A 10/07/2018   Procedure: INTRAVASCULAR PRESSURE WIRE/FFR STUDY;  Surgeon: [name], MD;  Location: [facility];  Service: Cardiovascular;  Laterality: N/A;   KNEE ARTHROSCOPY Right    LAPAROSCOPIC CHOLECYSTECTOMY     LEFT HEART CATH AND CORONARY ANGIOGRAPHY N/A 10/07/2018   Procedure: LEFT HEART CATH AND CORONARY ANGIOGRAPHY;  Surgeon: [name], MD;  Location: [facility];  Service: Cardiovascular;  Laterality: N/A;   LEFT HEART CATH AND CORONARY ANGIOGRAPHY N/A 04/08/2021   Procedure: LEFT HEART CATH AND CORONARY ANGIOGRAPHY;  Surgeon: [name], MD;  Location: [facility];  Service: Cardiovascular;  Laterality: N/A;   REPAIR ANKLE LIGAMENT Left    "tied up ligament; had tore up qthing in my ankle"   RIGHT  HEART CATH  10/07/2018   RIGHT/LEFT HEART CATH AND CORONARY ANGIOGRAPHY N/A 08/02/2018   Procedure: RIGHT/LEFT HEART CATH AND CORONARY ANGIOGRAPHY;  Surgeon: [name], MD;  Location: [facility];  Service: Cardiovascular;  Laterality: N/A;   SHOULDER SURGERY     bilateral   TONSILLECTOMY     TUBAL LIGATION     Family History  Problem Relation Age of Onset   Kidney failure Mother    Diabetes Mother    Heart disease Father    Heart attack Father    Colon cancer Brother     Liver cancer Brother    Heart disease Other    Arthritis Other    Cancer Other    Asthma Other    Diabetes Other    Kidney disease Other    Social History   Tobacco Use   Smoking status: Never   Smokeless tobacco: Never  Substance Use Topics   Alcohol use: Not Currently    Comment: 08/02/2018 "not since my 20's"   Marital Status: Married  ROS  Review of Systems  Cardiovascular:  Positive for chest pain and dyspnea on exertion. Negative for claudication, leg swelling, near-syncope, orthopnea, palpitations, paroxysmal nocturnal dyspnea and syncope.  Gastrointestinal:  Negative for melena.  Neurological:  Negative for dizziness.  Psychiatric/Behavioral:  Positive for depression. The patient is nervous/anxious.   Objective  Blood pressure 124/77, pulse 86, temperature 97.6 F (36.4 C), resp. rate 16, height  (1.626 m), weight 197 lb (89.4 kg), SpO2 98 %.  Vitals with BMI 04/16/2021 04/08/2021 04/08/2021  Height  - -  Weight 197 lbs - -  BMI 33.8 - -  Systolic 124 150 191  Diastolic 77 71 79  Pulse 86 81 79    Orthostatic VS for the past 72 hrs (Last 3 readings):  Orthostatic BP Patient Position BP Location Cuff Size Orthostatic Pulse  04/16/21 1315 130/73 Standing Left Arm Large 83  04/16/21 1314 134/74 Sitting Left Arm Large 83  04/16/21 1313 124/60 Supine Left Arm Large 79    Physical Exam Vitals reviewed.  Constitutional:      Appearance: She is well-developed. She is obese.  Cardiovascular:     Rate and Rhythm: Normal rate and regular rhythm.     Heart sounds: Normal heart sounds. No murmur heard.   No gallop.  Pulmonary:     Effort: Pulmonary effort is normal. No respiratory distress.  Musculoskeletal:     Right lower leg: No edema.     Left lower leg: No edema.  Neurological:     Mental Status: She is alert.   Laboratory examination:   CMP Latest Ref Rng & Units 04/03/2021 06/13/2019 06/13/2019  Glucose 65 - 99 mg/dL 75 81 81  BUN 8 - 27 mg/dL 25  15 47(W)  Creatinine 0.57 - 1.00 mg/dL 2.95(A) 2.13 0.86  Sodium 134 - 144 mmol/L 140 142 136  Potassium 3.5 - 5.2 mmol/L 4.8 4.0 7.3(HH)  Chloride 96 - 106 mmol/L 101 104 107  CO2 20 - 29 mmol/L 24 26 -  Calcium 8.7 - 10.3 mg/dL 9.9 9.2 -  Total Protein 6.5 - 8.1 g/dL - - -  Total Bilirubin 0.3 - 1.2 mg/dL - - -  Alkaline Phos 38 - 126 U/L - - -  AST 15 - 41 U/L - - -  ALT 0 - 44 U/L - - -   CBC Latest Ref Rng & Units 04/03/2021 06/13/2019 06/13/2019  WBC 3.4 - 10.8  x10E3/uL 12.8(H) - 6.9  Hemoglobin 11.1 - 15.9 g/dL 16.1 15.6(H) 15.0  Hematocrit 34.0 - 46.6 % 41.3 46.0 46.5(H)  Platelets 150 - 450 x10E3/uL 265 - 187   Lipid Panel     Component Value Date/Time   CHOL 141 01/05/2020 0816   TRIG 86 01/05/2020 0816   HDL 61 01/05/2020 0816   LDLCALC 64 01/05/2020 0816   HEMOGLOBIN A1C No results found for: HGBA1C, MPG TSH No results for input(s): TSH in the last 8760 hours.  External labs:   Hemoglobin 14.700 G/ 12/18/2020 Platelets 265.000 X1 12/18/2020  Creatinine, Serum 1.020 MG/ 12/18/2020 Potassium 4.000 mm 06/13/2019 ALT (SGPT) 18.000 IU/ 12/18/2020  TSH 2.100 12/18/2020  Allergies   Allergies  Allergen Reactions   Adhesive [Tape] Other (See Comments)    SKIN WILL TEAR EASILY!!!!   Statins Other (See Comments)    myalgia   Carvedilol     Unknown reaction    Diovan [Valsartan]     Tingling    Hydrochlorothiazide     Unknown reaction    Ibuprofen     Causes bp to go up   Keflex [Cephalexin]     confusion   Morphine And Related     Hyper, ineffective    Norvasc [Amlodipine Besylate]     Numbness and tingling    Nsaids     Esophagus became red and swollen (tolerates meloxicam)    Oxycodone Itching and Other (See Comments)    Welts, also (Tylox)   Prednisone     Elevated BP   Reglan [Metoclopramide]     confusion   Singulair [Montelukast] Diarrhea      Medications Prior to Visit:   Outpatient Medications Prior to Visit  Medication Sig Dispense  Refill   acetaminophen (TYLENOL) 500 MG tablet Take 1,000 mg by mouth every 8 (eight) hours as needed for moderate pain or headache.     albuterol (VENTOLIN HFA) 108 (90 Base) MCG/ACT inhaler Inhale 1 puff into the lungs every 4 (four) hours as needed for wheezing or shortness of breath.     aspirin EC 81 MG tablet Take 81 mg by mouth daily.     cyanocobalamin (,VITAMIN B-12,) 1000 MCG/ML injection Inject 1,000 mcg into the skin every 30 (thirty) days.     cyclobenzaprine (FLEXERIL) 10 MG tablet Take 10 mg by mouth at bedtime.     divalproex (DEPAKOTE ER) 250 MG 24 hr tablet Take 1 tablet (250 mg total) by mouth daily. 90 tablet 3   Evolocumab (REPATHA SURECLICK) 140 MG/ML SOAJ INJECT 140 MG INTO THE SKIN EVERY 2 WEEKS. 2 mL 6   ezetimibe (ZETIA) 10 MG tablet TAKE 1 TABLET BY MOUTH ONCE DAILY AFTER SUPPER. 30 tablet 6   hydrOXYzine (ATARAX/VISTARIL) 25 MG tablet Take 25 mg by mouth daily as needed for itching.     isosorbide mononitrate (IMDUR) 30 MG 24 hr tablet Take 1 tablet (30 mg total) by mouth daily at 12 noon. 90 tablet 3   labetalol (NORMODYNE) 100 MG tablet Take 1 tablet (100 mg total) by mouth 2 (two) times daily at 10 AM and 5 PM.     loratadine (CLARITIN) 10 MG tablet Take 10 mg by mouth at bedtime.     nitroGLYCERIN (NITROSTAT) 0.4 MG SL tablet Place 1 tablet (0.4 mg total) under the tongue every 5 (five) minutes as needed for chest pain. 30 tablet 2   oxybutynin (DITROPAN) 5 MG tablet Take 2.5 mg by mouth at bedtime.  1   pantoprazole (PROTONIX) 20 MG tablet TAKE 1 TABLET BY MOUTH ONCE A DAY. 30 tablet 6   triazolam (HALCION) 0.25 MG tablet Take 0.25 mg by mouth at bedtime.     verapamil (CALAN-SR) 120 MG CR tablet Take 1 tablet (120 mg total) by mouth 2 (two) times daily. 180 tablet 1   Vitamin D, Ergocalciferol, (DRISDOL) 1.25 MG (50000 UT) CAPS capsule Take 50,000 Units by mouth every Monday.     ticagrelor (BRILINTA) 90 MG TABS tablet Take 1 tablet (90 mg total) by mouth 2 (two)  times daily. 60 tablet 5   meloxicam (MOBIC) 15 MG tablet Take 15 mg by mouth daily.     No facility-administered medications prior to visit.     Final Medications at End of Visit    Current Meds  Medication Sig   acetaminophen (TYLENOL) 500 MG tablet Take 1,000 mg by mouth every 8 (eight) hours as needed for moderate pain or headache.   albuterol (VENTOLIN HFA) 108 (90 Base) MCG/ACT inhaler Inhale 1 puff into the lungs every 4 (four) hours as needed for wheezing or shortness of breath.   aspirin EC 81 MG tablet Take 81 mg by mouth daily.   clopidogrel (PLAVIX) 75 MG tablet Take 1 tablet (75 mg total) by mouth daily.   cyanocobalamin (,VITAMIN B-12,) 1000 MCG/ML injection Inject 1,000 mcg into the skin every 30 (thirty) days.   cyclobenzaprine (FLEXERIL) 10 MG tablet Take 10 mg by mouth at bedtime.   divalproex (DEPAKOTE ER) 250 MG 24 hr tablet Take 1 tablet (250 mg total) by mouth daily.   Evolocumab (REPATHA SURECLICK) 140 MG/ML SOAJ INJECT 140 MG INTO THE SKIN EVERY 2 WEEKS.   ezetimibe (ZETIA) 10 MG tablet TAKE 1 TABLET BY MOUTH ONCE DAILY AFTER SUPPER.   hydrOXYzine (ATARAX/VISTARIL) 25 MG tablet Take 25 mg by mouth daily as needed for itching.   isosorbide mononitrate (IMDUR) 30 MG 24 hr tablet Take 1 tablet (30 mg total) by mouth daily at 12 noon.   labetalol (NORMODYNE) 100 MG tablet Take 1 tablet (100 mg total) by mouth 2 (two) times daily at 10 AM and 5 PM.   loratadine (CLARITIN) 10 MG tablet Take 10 mg by mouth at bedtime.   nitroGLYCERIN (NITROSTAT) 0.4 MG SL tablet Place 1 tablet (0.4 mg total) under the tongue every 5 (five) minutes as needed for chest pain.   oxybutynin (DITROPAN) 5 MG tablet Take 2.5 mg by mouth at bedtime.    pantoprazole (PROTONIX) 20 MG tablet TAKE 1 TABLET BY MOUTH ONCE A DAY.   triazolam (HALCION) 0.25 MG tablet Take 0.25 mg by mouth at bedtime.   verapamil (CALAN-SR) 120 MG CR tablet Take 1 tablet (120 mg total) by mouth 2 (two) times daily.    Vitamin D, Ergocalciferol, (DRISDOL) 1.25 MG (50000 UT) CAPS capsule Take 50,000 Units by mouth every Monday.   [DISCONTINUED] ticagrelor (BRILINTA) 90 MG TABS tablet Take 1 tablet (90 mg total) by mouth 2 (two) times daily.    Radiology:   Chest X-Ray 01/09/2017: IMPRESSION: No active cardiopulmonary disease.  MR Angio Head WO Contrast 06/13/2019: IMPRESSION: 1. No evidence of ischemia on repeat diffusion imaging. 2. No abnormal enhancement following contrast. 3. Intracranial MRA is within normal limits.  EEG 07/05/19: Normal electroencephalogram, awake, asleep and with activation procedures. There are no focal lateralizing or epileptiform features.   Cardiac Studies:   Lexiscan Sestamibi stress test 07/22/2018:  1. Lexiscan stress test was performed. Exercise capacity  was not assessed. Stress symptoms included chest pain, dyspnea, nausea, dizziness. Peak blood pressure was 148/98 mmHg. The resting and stress electrocardiogram demonstrated normal sinus rhythm, normal resting conduction, no resting arrhythmias and normal rest repolarization.  Stress EKG is non diagnostic for ischemia as it is a pharmacologic stress.  2. The overall quality of the study is good.  Left ventricular cavity is noted to be normal on the rest and stress studies.  Gated SPECT images reveal normal myocardial thickening and wall motion.  The left ventricular ejection fraction was calculated 45%, although visually appears normal. SPECT images reveal small area of mild intensity, reversible perfusion defect in apical inferolateral myocardium, that may represent ischemia.  3. Intermediate risk study due to small perfusion defect and mildly reduced LVEF. Recommend correlation with echocardiogram.   7 day event monitor 07/13/2018-07/19/2018: Dominant rhythm: Normal sinus rhythm.  11 patient activated events occurred with symptoms of chest pain, shortness of breath, rapid heart rate, correlating with sinus tachycardia.   Fastest tachycardia episode was day one at 1400 with 133 beats per without reported symptoms.  When autodetect event of sinus rhythm with 6 beats of NSVT on day 2 10:51 AM with symptoms of chest pain, shortness of breath.  No A. fib or SVT was noted.  Coronary angiogram 10/07/2018: No change from 08/02/2018 except D1 stenosis 50-60%, DFR negative. Normal right heart catheterization. The left ventricular systolic function is normal. LV end diastolic pressure is normal. Superdominant large RCA. Prox RCA to Mid RCA lesion is 80% stenosed S/P STENT ORSIRO DES 4.0X30 with 0% residual stenosis. Ost RPDA lesion is 50% stenosed. Small LAD ends before reaching apex.  PCV ECHOCARDIOGRAM COMPLETE 11/01/2020 Left ventricle cavity is normal in size and wall thickness. Normal global wall motion. Normal LV systolic function with EF 55%. Doppler evidence of grade I (impaired) diastolic dysfunction, normal LAP. Calculated EF 55%. Left atrial cavity is normal in size. Aneurysmal interatrial septum without 2D or color Doppler evidence of shunting. Trileaflet aortic valve.  Trace aortic regurgitation. Mild (Grade I) mitral regurgitation. Normal right atrial pressure. 4X6 cm echolucent area seen in liver, likely s/o liver cyst. Consider dedicated liver ultrasound. Compared to previous study in 2014, liver finding is new. Correlates with MRI of the abdomen done 01/11/2019 revealing multiple liver cysts.  Lexiscan Nuclear stress test 03/31/2021: Nondiagnostic ECG stress due to pharmacologic stress. Myocardial perfusion is abnormal. There is a fixed moderate defect in the lateral and inferior regions (Scar). There is a reversible moderate defect in the septal and apical regions (Ischemia). Overall LV systolic function is abnormal with Mild inferoseptal and inferolateral severe hypokinesis. Stress LV EF: 41%. High risk study.  Prior study done on 07/22/2018 revealed a small inferolateral and apical defect consistent with  ischemia and EF was 45%.  Left heart catheterization 04/08/2021: LV: 153/9, EDP 14 mmHg.  Ao 151/78, mean 109 mmHg.  No pressure gradient across the aortic valve. LM: Large vessel, smooth and normal. LAD: Large vessel in the proximal and, gives origin to a very large D1 and a large D2 and LAD itself is very small. CX: Moderate caliber vessel, smooth and normal. RI: Very tiny vessel with diffuse disease in the proximal segment. RCA: Dominant vessel.  Gives origin to large PL branch and large PDA.  Proximal to previously placed 3.0 x 30 mm Orsiro stent on 08/02/2020 is patent with mild neointimal hyperplasia.  Proximal to the stent there is a 80% stenosis.  Successful stenting with 4.0 x 16 mm Synergy  XD DES = 0% stenosis. Ostial PDA 80% calcific stenosis, successful stenting with 2.5 x 12 mm Synergy XD DES = 0% stenosis. Recommendation: Patient will be discharged home with outpatient follow-up, continued aggressive risk factor modification is indicated.  Hopefully now that she is on PCSK9 inhibitors and lipids are well controlled, she will not have progression of CAD.  125 mL contrast utilized.     EKG   04/16/2021: Sinus rhythm rate of 82 bpm.  Left axis, cannot exclude inferior infarct old.  Incomplete right bundle branch block.  Poor R wave progression, cannot exclude anteroseptal infarct old.  Nonspecific T wave abnormality.  02/14/2021: Normal sinus rhythm at rate of 86 bpm, left axis deviation, cannot exclude inferior infarct old.  Incomplete right bundle branch block.  Poor R wave progression, cannot exclude anteroseptal infarct old.  Normal QT interval.  No significant change from EKG 10/18/2020.   Assessment     ICD-10-CM   1. Essential hypertension  I10 EKG 12-Lead    2. Coronary artery disease of native artery of native heart with stable angina pectoris (HCC)  I25.118     3. Dyspnea on exertion  R06.00       Meds ordered this encounter  Medications   clopidogrel (PLAVIX) 75 MG  tablet    Sig: Take 1 tablet (75 mg total) by mouth daily.    Dispense:  90 tablet    Refill:  3    Patient intolerant to Brilinta due to shortness of breath.     Medications Discontinued During This Encounter  Medication Reason   meloxicam (MOBIC) 15 MG tablet Error   ticagrelor (BRILINTA) 90 MG TABS tablet Side effect (s)   Recommendations:   Tonya Boyd  is a 65 y.o. 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   Due to ongoing anginal symptoms patient underwent nuclear stress test which was markedly abnormal, and therefore she had subsequent repeat cardiac catheterization 04/08/2021 with stenting to proximal RCA and ostial PDA lesions.  She now presents with ongoing chest pain and shortness of breath which is now present in the morning.  Suspect this is secondary to Brilinta.  We will therefore discontinue Brilinta and switch patient to Plavix 75 mg daily.  Advised patient to notify our office if she continues to have symptoms in 2 days, could consider addition of Ranexa if so.  Follow-up in 1 week, sooner if needed, for chest pain and shortness of breath.  Patient was seen in collaboration with Dr. Jacinto Halim. He also reviewed patient's chart and examined the patient. Dr. Jacinto Halim is in agreement of the plan.    Rayford Halsted, PA-C 04/16/2021, 4:19 PM Office: [phone]

## 2021-04-18 ENCOUNTER — Telehealth: Payer: Self-pay | Admitting: Student

## 2021-04-18 ENCOUNTER — Other Ambulatory Visit (HOSPITAL_COMMUNITY): Payer: Self-pay

## 2021-04-18 MED ORDER — RANOLAZINE ER 500 MG PO TB12
500.0000 mg | ORAL_TABLET | Freq: Two times a day (BID) | ORAL | 3 refills | Status: DC
Start: 2021-04-18 — End: 2021-04-22

## 2021-04-18 NOTE — Telephone Encounter (Signed)
Called and spoke to pt. Pt still does not feel any better since her visit. She also stated that she is willing to try the new medication that you recommended. I am not sure which one this is, It looks like you had added Clopidogrel 75 mg.

## 2021-04-18 NOTE — Telephone Encounter (Signed)
Pt stated she was told to call if her symptoms did not improve; Pt still feeling unwell. She said she was told also to call about trying a new medication for again & getting that prescription to the pharmacy. She would like a call back today.

## 2021-04-18 NOTE — Telephone Encounter (Signed)
Called and spoke with patient.  She has switched to complete a grill 75 mg and discontinued Brilinta, however she continues to have dyspnea symptoms.  We will add Ranexa 500 mg twice daily as discussed at last office visit.

## 2021-04-21 ENCOUNTER — Other Ambulatory Visit (HOSPITAL_COMMUNITY): Payer: Self-pay

## 2021-04-21 ENCOUNTER — Telehealth (HOSPITAL_COMMUNITY): Payer: Self-pay

## 2021-04-21 NOTE — Progress Notes (Signed)
Primary Physician/Referring:  Georgianne Fick, MD  Patient ID: Tonya Boyd, female    DOB: 05-22-1956, 65 y.o.   MRN: [id number]  Chief Complaint  Patient presents with   Follow-up   Hypertension   stent placement   HPI:    Tonya Boyd  is a 65 y.o. 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  Due to ongoing angina symptoms at last visit patient underwent repeat coronary angiography with successful stenting to proximal RCA and ostial PDA.   Patient presents for 1 week follow-up.  At last visit she presented with concerns of shortness of breath following cardiac catheterization.  Discontinue Brilinta and switch patient to Plavix.  However she called the office with continued symptoms 3 days later.  Therefore added Ranexa 500 mg twice daily.  Patient states her symptoms have significantly improved with Plavix and addition of Ranexa.  She does however continue to have intermittent left-sided chest pain with associated shortness of breath, primarily with exertion and immediately relieved with rest.  She has taken single sublingual nitroglycerin since last visit due to chest pain.  She does note that with the addition of Ranexa she has been experiencing some constipation for which she is using Metamucil.  Denies palpitations, syncope, near syncope, dizziness.   Past Medical History:  Diagnosis Date   Angina pectoris (HCC)    Asthma    "as a child and as an adult" (08/02/2018)   CAD (coronary artery disease), native coronary artery 08/03/2018   Chronic bronchitis (HCC)    Chronic kidney disease    Coronary artery disease    Family history of adverse reaction to anesthesia    "cousin stopped breathing; he was  allergic to the anesthesia" (08/02/2018)   GERD (gastroesophageal reflux disease)    High cholesterol    History of gout    History of hiatal hernia    History of kidney stones    Hypertension 07/2018   Interstitial cystitis    NSVT (nonsustained ventricular tachycardia) (HCC) 07/2018   Raynaud's disease    "feet and hands" (08/02/2018)   Raynaud's disease without gangrene 10/15/2018   Past Surgical History:  Procedure Laterality Date   ABDOMINAL HYSTERECTOMY     ANTERIOR CERVICAL DECOMP/DISCECTOMY FUSION     APPENDECTOMY     AUGMENTATION MAMMAPLASTY Bilateral    BACK SURGERY     CORONARY ANGIOPLASTY WITH STENT PLACEMENT  08/02/2018   CORONARY STENT INTERVENTION N/A 08/02/2018   Procedure: CORONARY STENT INTERVENTION;  Surgeon: [name], MD;  Location: [facility];  Service: Cardiovascular;  Laterality: N/A;  RCA    CORONARY STENT INTERVENTION N/A 04/08/2021   Procedure: CORONARY STENT INTERVENTION;  Surgeon: [name], MD;  Location: [facility];  Service: Cardiovascular;  Laterality: N/A;  RCA and PDA   CYSTOSCOPY W/ STONE MANIPULATION     INTRAVASCULAR PRESSURE WIRE/FFR STUDY N/A 10/07/2018   Procedure: INTRAVASCULAR PRESSURE WIRE/FFR STUDY;  Surgeon: [name], MD;  Location: [facility];  Service: Cardiovascular;  Laterality: N/A;   KNEE ARTHROSCOPY Right    LAPAROSCOPIC CHOLECYSTECTOMY     LEFT HEART CATH AND CORONARY ANGIOGRAPHY N/A 10/07/2018   Procedure: LEFT HEART CATH AND CORONARY ANGIOGRAPHY;  Surgeon: [name], MD;  Location: [facility]  INVASIVE CV LAB;  Service: Cardiovascular;  Laterality: N/A;   LEFT HEART CATH AND CORONARY ANGIOGRAPHY N/A 04/08/2021   Procedure: LEFT HEART CATH AND CORONARY ANGIOGRAPHY;  Surgeon: [name], MD;  Location: [facility];  Service: Cardiovascular;  Laterality: N/A;   REPAIR ANKLE LIGAMENT Left    "tied up ligament; had tore up qthing in my ankle"   RIGHT HEART CATH  10/07/2018   RIGHT/LEFT HEART CATH AND CORONARY  ANGIOGRAPHY N/A 08/02/2018   Procedure: RIGHT/LEFT HEART CATH AND CORONARY ANGIOGRAPHY;  Surgeon: [name], MD;  Location: [facility];  Service: Cardiovascular;  Laterality: N/A;   SHOULDER SURGERY     bilateral   TONSILLECTOMY     TUBAL LIGATION     Family History  Problem Relation Age of Onset   Kidney failure Mother    Diabetes Mother    Heart disease Father    Heart attack Father    Colon cancer Brother    Liver cancer Brother    Heart disease Other    Arthritis Other    Cancer Other    Asthma Other    Diabetes Other    Kidney disease Other    Social History   Tobacco Use   Smoking status: Never   Smokeless tobacco: Never  Substance Use Topics   Alcohol use: Not Currently    Comment: 08/02/2018 "not since my 20's"   Marital Status: Married  ROS  Review of Systems  Cardiovascular:  Positive for chest pain (improved) and dyspnea on exertion (improved). Negative for claudication, leg swelling, near-syncope, orthopnea, palpitations, paroxysmal nocturnal dyspnea and syncope.  Gastrointestinal:  Negative for melena.  Neurological:  Negative for dizziness.  Psychiatric/Behavioral:  Positive for depression. The patient is nervous/anxious.   Objective  Blood pressure 120/78, pulse 68, temperature (!) 97.2 F (36.2 C), temperature source Temporal, resp. rate 17, height 5\' 4"  (1.626 m), weight 199 lb 3.2 oz (90.4 kg), SpO2 97 %.  Vitals with BMI 04/22/2021 04/16/2021 04/08/2021  Height 5\' 4"  5\' 4"  -  Weight 199 lbs 3 oz 197 lbs -  BMI 34.18 33.8 -  Systolic 120 124 06/08/2021  Diastolic 78 77 71  Pulse 68 86 81    Physical Exam Vitals reviewed.  Constitutional:      Appearance: She is well-developed. She is obese.  HENT:     Head: Normocephalic and atraumatic.  Cardiovascular:     Rate and Rhythm: Normal rate and regular rhythm.     Heart sounds: Normal heart sounds. No murmur heard.   No gallop.  Pulmonary:     Effort: Pulmonary effort is normal. No respiratory  distress.  Musculoskeletal:     Right lower leg: No edema.     Left lower leg: No edema.  Skin:    General: Skin is warm and dry.  Neurological:     Mental Status: She is alert.   Laboratory examination:   CMP Latest Ref Rng & Units 04/03/2021 06/13/2019 06/13/2019  Glucose 65 - 99 mg/dL 75 81 81  BUN 8 - 27 mg/dL 25 15 04/05/2021)  Creatinine 0.57 - 1.00 mg/dL 08/13/2019) 08/13/2019 70(J  Sodium 134 - 144 mmol/L 140 142 136  Potassium 3.5 - 5.2 mmol/L 4.8 4.0 7.3(HH)  Chloride 96 - 106 mmol/L 101 104 107  CO2 20 - 29 mmol/L 24 26 -  Calcium 8.7 - 10.3 mg/dL 9.9 9.2 -  Total Protein 6.5 - 8.1 g/dL - - -  Total Bilirubin 0.3 -  1.2 mg/dL - - -  Alkaline Phos 38 - 126 U/L - - -  AST 15 - 41 U/L - - -  ALT 0 - 44 U/L - - -   CBC Latest Ref Rng & Units 04/03/2021 06/13/2019 06/13/2019  WBC 3.4 - 10.8 x10E3/uL 12.8(H) - 6.9  Hemoglobin 11.1 - 15.9 g/dL 16.113.9 15.6(H) 15.0  Hematocrit 34.0 - 46.6 % 41.3 46.0 46.5(H)  Platelets 150 - 450 x10E3/uL 265 - 187   Lipid Panel     Component Value Date/Time   CHOL 141 01/05/2020 0816   TRIG 86 01/05/2020 0816   HDL 61 01/05/2020 0816   LDLCALC 64 01/05/2020 0816   HEMOGLOBIN A1C No results found for: HGBA1C, MPG TSH No results for input(s): TSH in the last 8760 hours.  External labs:   Hemoglobin 14.700 G/ 12/18/2020 Platelets 265.000 X1 12/18/2020  Creatinine, Serum 1.020 MG/ 12/18/2020 Potassium 4.000 mm 06/13/2019 ALT (SGPT) 18.000 IU/ 12/18/2020  TSH 2.100 12/18/2020  Allergies   Allergies  Allergen Reactions   Adhesive [Tape] Other (See Comments)    SKIN WILL TEAR EASILY!!!!   Brilinta [Ticagrelor] Shortness Of Breath   Statins Other (See Comments)    myalgia   Carvedilol     Unknown reaction    Diovan [Valsartan]     Tingling    Hydrochlorothiazide     Unknown reaction    Ibuprofen     Causes bp to go up   Keflex [Cephalexin]     confusion   Morphine And Related     Hyper, ineffective    Norvasc [Amlodipine Besylate]      Numbness and tingling    Nsaids     Esophagus became red and swollen (tolerates meloxicam)    Oxycodone Itching and Other (See Comments)    Welts, also (Tylox)   Prednisone     Elevated BP   Reglan [Metoclopramide]     confusion   Singulair [Montelukast] Diarrhea    Medications Prior to Visit:   Outpatient Medications Prior to Visit  Medication Sig Dispense Refill   acetaminophen (TYLENOL) 500 MG tablet Take 1,000 mg by mouth every 8 (eight) hours as needed for moderate pain or headache.     albuterol (VENTOLIN HFA) 108 (90 Base) MCG/ACT inhaler Inhale 1 puff into the lungs every 4 (four) hours as needed for wheezing or shortness of breath.     aspirin EC 81 MG tablet Take 81 mg by mouth daily.     clopidogrel (PLAVIX) 75 MG tablet Take 1 tablet (75 mg total) by mouth daily. 90 tablet 3   cyanocobalamin (,VITAMIN B-12,) 1000 MCG/ML injection Inject 1,000 mcg into the skin every 30 (thirty) days.     cyclobenzaprine (FLEXERIL) 10 MG tablet Take 10 mg by mouth at bedtime.     divalproex (DEPAKOTE ER) 250 MG 24 hr tablet Take 1 tablet (250 mg total) by mouth daily. 90 tablet 3   Evolocumab (REPATHA SURECLICK) 140 MG/ML SOAJ INJECT 140 MG INTO THE SKIN EVERY 2 WEEKS. 2 mL 6   ezetimibe (ZETIA) 10 MG tablet TAKE 1 TABLET BY MOUTH ONCE DAILY AFTER SUPPER. 30 tablet 6   hydrOXYzine (ATARAX/VISTARIL) 25 MG tablet Take 25 mg by mouth daily as needed for itching.     isosorbide mononitrate (IMDUR) 30 MG 24 hr tablet Take 1 tablet (30 mg total) by mouth daily at 12 noon. 90 tablet 3   labetalol (NORMODYNE) 100 MG tablet Take 1 tablet (100 mg total) by mouth  2 (two) times daily at 10 AM and 5 PM.     loratadine (CLARITIN) 10 MG tablet Take 10 mg by mouth at bedtime.     nitroGLYCERIN (NITROSTAT) 0.4 MG SL tablet Place 1 tablet (0.4 mg total) under the tongue every 5 (five) minutes as needed for chest pain. 30 tablet 2   oxybutynin (DITROPAN) 5 MG tablet Take 2.5 mg by mouth at bedtime.   1    pantoprazole (PROTONIX) 20 MG tablet TAKE 1 TABLET BY MOUTH ONCE A DAY. 30 tablet 6   triazolam (HALCION) 0.25 MG tablet Take 0.25 mg by mouth at bedtime.     verapamil (CALAN-SR) 120 MG CR tablet Take 1 tablet (120 mg total) by mouth 2 (two) times daily. 180 tablet 1   Vitamin D, Ergocalciferol, (DRISDOL) 1.25 MG (50000 UT) CAPS capsule Take 50,000 Units by mouth every Monday.     ranolazine (RANEXA) 500 MG 12 hr tablet Take 1 tablet (500 mg total) by mouth 2 (two) times daily. 60 tablet 3   No facility-administered medications prior to visit.   Final Medications at End of Visit    Current Meds  Medication Sig   acetaminophen (TYLENOL) 500 MG tablet Take 1,000 mg by mouth every 8 (eight) hours as needed for moderate pain or headache.   albuterol (VENTOLIN HFA) 108 (90 Base) MCG/ACT inhaler Inhale 1 puff into the lungs every 4 (four) hours as needed for wheezing or shortness of breath.   aspirin EC 81 MG tablet Take 81 mg by mouth daily.   clopidogrel (PLAVIX) 75 MG tablet Take 1 tablet (75 mg total) by mouth daily.   cyanocobalamin (,VITAMIN B-12,) 1000 MCG/ML injection Inject 1,000 mcg into the skin every 30 (thirty) days.   cyclobenzaprine (FLEXERIL) 10 MG tablet Take 10 mg by mouth at bedtime.   divalproex (DEPAKOTE ER) 250 MG 24 hr tablet Take 1 tablet (250 mg total) by mouth daily.   Evolocumab (REPATHA SURECLICK) 140 MG/ML SOAJ INJECT 140 MG INTO THE SKIN EVERY 2 WEEKS.   ezetimibe (ZETIA) 10 MG tablet TAKE 1 TABLET BY MOUTH ONCE DAILY AFTER SUPPER.   hydrOXYzine (ATARAX/VISTARIL) 25 MG tablet Take 25 mg by mouth daily as needed for itching.   isosorbide mononitrate (IMDUR) 30 MG 24 hr tablet Take 1 tablet (30 mg total) by mouth daily at 12 noon.   labetalol (NORMODYNE) 100 MG tablet Take 1 tablet (100 mg total) by mouth 2 (two) times daily at 10 AM and 5 PM.   loratadine (CLARITIN) 10 MG tablet Take 10 mg by mouth at bedtime.   nitroGLYCERIN (NITROSTAT) 0.4 MG SL tablet Place 1  tablet (0.4 mg total) under the tongue every 5 (five) minutes as needed for chest pain.   oxybutynin (DITROPAN) 5 MG tablet Take 2.5 mg by mouth at bedtime.    pantoprazole (PROTONIX) 20 MG tablet TAKE 1 TABLET BY MOUTH ONCE A DAY.   triazolam (HALCION) 0.25 MG tablet Take 0.25 mg by mouth at bedtime.   verapamil (CALAN-SR) 120 MG CR tablet Take 1 tablet (120 mg total) by mouth 2 (two) times daily.   Vitamin D, Ergocalciferol, (DRISDOL) 1.25 MG (50000 UT) CAPS capsule Take 50,000 Units by mouth every Monday.   [DISCONTINUED] ranolazine (RANEXA) 500 MG 12 hr tablet Take 1 tablet (500 mg total) by mouth 2 (two) times daily.    Radiology:   Chest X-Ray 01/09/2017: IMPRESSION: No active cardiopulmonary disease.  MR Angio Head WO Contrast 06/13/2019: IMPRESSION: 1. No evidence  of ischemia on repeat diffusion imaging. 2. No abnormal enhancement following contrast. 3. Intracranial MRA is within normal limits.  EEG 07/05/19: Normal electroencephalogram, awake, asleep and with activation procedures. There are no focal lateralizing or epileptiform features.   Cardiac Studies:   Lexiscan Sestamibi stress test 07/22/2018:  1. Lexiscan stress test was performed. Exercise capacity was not assessed. Stress symptoms included chest pain, dyspnea, nausea, dizziness. Peak blood pressure was 148/98 mmHg. The resting and stress electrocardiogram demonstrated normal sinus rhythm, normal resting conduction, no resting arrhythmias and normal rest repolarization.  Stress EKG is non diagnostic for ischemia as it is a pharmacologic stress.  2. The overall quality of the study is good.  Left ventricular cavity is noted to be normal on the rest and stress studies.  Gated SPECT images reveal normal myocardial thickening and wall motion.  The left ventricular ejection fraction was calculated 45%, although visually appears normal. SPECT images reveal small area of mild intensity, reversible perfusion defect in apical  inferolateral myocardium, that may represent ischemia.  3. Intermediate risk study due to small perfusion defect and mildly reduced LVEF. Recommend correlation with echocardiogram.   7 day event monitor 07/13/2018-07/19/2018: Dominant rhythm: Normal sinus rhythm.  11 patient activated events occurred with symptoms of chest pain, shortness of breath, rapid heart rate, correlating with sinus tachycardia.  Fastest tachycardia episode was day one at 1400 with 133 beats per without reported symptoms.  When autodetect event of sinus rhythm with 6 beats of NSVT on day 2 10:51 AM with symptoms of chest pain, shortness of breath.  No A. fib or SVT was noted.  Coronary angiogram 10/07/2018: No change from 08/02/2018 except D1 stenosis 50-60%, DFR negative. Normal right heart catheterization. The left ventricular systolic function is normal. LV end diastolic pressure is normal. Superdominant large RCA. Prox RCA to Mid RCA lesion is 80% stenosed S/P STENT ORSIRO DES 4.0X30 with 0% residual stenosis. Ost RPDA lesion is 50% stenosed. Small LAD ends before reaching apex.  PC[facility] ECHOCARDIOGRAM COMPLETE 11/01/2020 Left ventricle cavity is normal in size and wall thickness. Normal global wall motion. Normal LV systolic function with EF 55%. Doppler evidence of grade I (impaired) diastolic dysfunction, normal LAP. Calculated EF 55%. Left atrial cavity is normal in size. Aneurysmal interatrial septum without 2D or color Doppler evidence of shunting. Trileaflet aortic valve.  Trace aortic regurgitation. Mild (Grade I) mitral regurgitation. Normal right atrial pressure. 4X6 cm echolucent area seen in liver, likely s/o liver cyst. Consider dedicated liver ultrasound. Compared to previous study in 2014, liver finding is new. Correlates with MRI of the abdomen done 01/11/2019 revealing multiple liver cysts.  Lexiscan Nuclear stress test 03/31/2021: Nondiagnostic ECG stress due to pharmacologic stress. Myocardial perfusion  is abnormal. There is a fixed moderate defect in the lateral and inferior regions (Scar). There is a reversible moderate defect in the septal and apical regions (Ischemia). Overall LV systolic function is abnormal with Mild inferoseptal and inferolateral severe hypokinesis. Stress LV EF: 41%. High risk study.  Prior study done on 07/22/2018 revealed a small inferolateral and apical defect consistent with ischemia and EF was 45%.  Left heart catheterization 04/08/2021: LV: 153/9, EDP 14 mmHg.  Ao 151/78, mean 109 mmHg.  No pressure gradient across the aortic valve. LM: Large vessel, smooth and normal. LAD: Large vessel in the proximal and, gives origin to a very large D1 and a large D2 and LAD itself is very small. CX: Moderate caliber vessel, smooth and normal. RI: Very tiny vessel  with diffuse disease in the proximal segment. RCA: Dominant vessel.  Gives origin to large PL branch and large PDA.  Proximal to previously placed 3.0 x 30 mm Orsiro stent on 08/02/2020 is patent with mild neointimal hyperplasia.  Proximal to the stent there is a 80% stenosis.  Successful stenting with 4.0 x 16 mm Synergy XD DES = 0% stenosis. Ostial PDA 80% calcific stenosis, successful stenting with 2.5 x 12 mm Synergy XD DES = 0% stenosis. Recommendation: Patient will be discharged home with outpatient follow-up, continued aggressive risk factor modification is indicated.  Hopefully now that she is on PCSK9 inhibitors and lipids are well controlled, she will not have progression of CAD.  125 mL contrast utilized.     EKG   04/16/2021: Sinus rhythm rate of 82 bpm.  Left axis, cannot exclude inferior infarct old.  Incomplete right bundle branch block.  Poor R wave progression, cannot exclude anteroseptal infarct old.  Nonspecific T wave abnormality.  02/14/2021: Normal sinus rhythm at rate of 86 bpm, left axis deviation, cannot exclude inferior infarct old.  Incomplete right bundle branch block.  Poor R wave progression,  cannot exclude anteroseptal infarct old.  Normal QT interval.  No significant change from EKG 10/18/2020.   Assessment     ICD-10-CM   1. Coronary artery disease of native artery of native heart with stable angina pectoris (HCC)  I25.118     2. Dyspnea on exertion  R06.00     3. S/P coronary artery stent placement  Z95.5       Meds ordered this encounter  Medications   ranolazine (RANEXA) 1000 MG SR tablet    Sig: Take 1 tablet (1,000 mg total) by mouth 2 (two) times daily.    Dispense:  60 tablet    Refill:  3     Medications Discontinued During This Encounter  Medication Reason   ranolazine (RANEXA) 500 MG 12 hr tablet Reorder   Recommendations:   Tonya Boyd  is a 65 y.o. 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   Patient presents for 1 week follow-up.  At last visit she presented with concerns of shortness of breath following cardiac catheterization.  Discontinue Brilinta and switch patient to Plavix.  However she called the office with continued symptoms 3 days later.  Therefore added Ranexa 500 mg twice daily.  Patient's symptoms have significantly improved, however she does continue to have chest pain and dyspnea on exertion.  Suspect symptoms are multifactorial including deconditioning as well as underlying CAD.  Discussed with patient management options including up titration of antianginal therapy either Imdur or Ranexa.  Patient states higher doses of Imdur have caused her fatigue.  Shared decision was to proceed with increasing Ranexa from 500 mg 2000 mg twice daily.  Patient will also take MiraLAX on a daily basis to improve constipation.  Patient will monitor symptoms closely and notify our  office if symptoms worsen or fail to improve.  We will continue dual antiplatelet therapy.  No other changes made to patient's medications at this time.  Follow-up in 3 months, sooner if needed, for CAD.   Rayford Halsted, PA-C 04/22/2021, 9:58 AM Office: [phone]

## 2021-04-21 NOTE — Telephone Encounter (Signed)
Transitions of Care Pharmacy  ° °Call attempted for a pharmacy transitions of care follow-up. HIPAA appropriate voicemail was left with call back information provided.  ° °Call attempt #1. Will follow-up in 2-3 days.  °  °

## 2021-04-22 ENCOUNTER — Telehealth (HOSPITAL_COMMUNITY): Payer: Self-pay

## 2021-04-22 ENCOUNTER — Ambulatory Visit: Payer: Commercial Managed Care - PPO | Admitting: Student

## 2021-04-22 ENCOUNTER — Other Ambulatory Visit: Payer: Self-pay

## 2021-04-22 ENCOUNTER — Encounter: Payer: Self-pay | Admitting: Student

## 2021-04-22 VITALS — BP 120/78 | HR 68 | Temp 97.2°F | Resp 17 | Ht 64.0 in | Wt 199.2 lb

## 2021-04-22 DIAGNOSIS — Z955 Presence of coronary angioplasty implant and graft: Secondary | ICD-10-CM

## 2021-04-22 DIAGNOSIS — R0600 Dyspnea, unspecified: Secondary | ICD-10-CM

## 2021-04-22 DIAGNOSIS — R0609 Other forms of dyspnea: Secondary | ICD-10-CM

## 2021-04-22 DIAGNOSIS — I25118 Atherosclerotic heart disease of native coronary artery with other forms of angina pectoris: Secondary | ICD-10-CM

## 2021-04-22 MED ORDER — RANOLAZINE ER 1000 MG PO TB12: 1000.0000 mg | ORAL_TABLET | Freq: Two times a day (BID) | ORAL | 3 refills | Status: DC

## 2021-04-22 NOTE — Telephone Encounter (Signed)
Transitions of Care Pharmacy   Call attempted for a pharmacy transitions of care follow-up. HIPAA appropriate voicemail was left with call back information provided.   Call attempt #2. Will follow-up in 2-3 days.    

## 2021-04-23 ENCOUNTER — Telehealth (HOSPITAL_COMMUNITY): Payer: Self-pay

## 2021-04-23 NOTE — Telephone Encounter (Signed)
Pharmacy Transitions of Care Follow-up Telephone Call  Date of discharge: 04/08/21  Discharge Diagnosis: Coronary Artery Stent placement  How have you been since you were released from the hospital?  Patient has been doing well since changing from Brilinta to Plavix. Patient has a puppy and has noticed additional bruising from her dog jumping on her feet in particular. Cautioned patient about bleeding easier from puppy teething bites if they break the skin. Patient has been experiencing constipation but MD is aware and having her take Miralax. Also recommneded docusate.  Medication changes made at discharge:  - START: Ticagrelor; patient did not tolerate, so it was changed to Clopidogrel  Medication changes verified by the patient?  Yes    Medication Accessibility:  Home Pharmacy: [facility]   Was the patient provided with refills on discharged medications? Yes   Have all prescriptions been transferred from [facility] to home pharmacy? yes   Is the patient able to afford medications? Patient has [employer] insurance    Medication Review:  CLOPIDOGREL (PLAVIX) Clopidogrel 75 mg once daily.  - Educated patient on expected duration of therapy of ASA 81mg  with clopidogrel. - Advised patient of medications to avoid (NSAIDs, ASA)  - Educated that Tylenol (acetaminophen) will be the preferred analgesic to prevent risk of bleeding  - Emphasized importance of monitoring for signs and symptoms of bleeding (abnormal bruising, prolonged bleeding, nose bleeds, bleeding from gums, discolored urine, black tarry stools)  - Advised patient to alert all providers of anticoagulation therapy prior to starting a new medication or having a procedure   Follow-up Appointments:  PCP Hospital f/u appt confirmed?  None currently scheduled  Specialist Hospital f/u appt confirmed? Already had f/u with cardiology 04/16/21   If their condition worsens, is the pt aware to call PCP or go to the Emergency Dept.?  Yes  Final Patient Assessment:  Patient has already had follow up and refills are at home pharmacy

## 2021-04-25 ENCOUNTER — Telehealth: Payer: Self-pay

## 2021-04-25 NOTE — Telephone Encounter (Signed)
Advised patient to increase MiraLAX to one half caps daily and reevaluate symptoms in 3 to 4 days.  Could consider increasing Ranexa, however worried that constipation will become an inhibiting factor.

## 2021-04-25 NOTE — Telephone Encounter (Signed)
Called Tonya Boyd, she stated that she has been feeling SOB and has been having difficulty using the bathroom. Tonya Boyd has been taking Ranexa 500mg  in the am and 500mg  at night because of this. She does not want to take the 1000 mg yet. She also stated that you had mentioned upping the dose of her IMDUR. Patient has taken Miralax for 3 days and has only used the bathroom a little bit. Patients BP is 106/72 her HR is 83. Tonya Boyd stated the SOB mostly happens when she is up and walking around. Tonya Boyd stated she still has the angina.

## 2021-05-05 ENCOUNTER — Ambulatory Visit: Payer: Commercial Managed Care - PPO

## 2021-05-05 ENCOUNTER — Ambulatory Visit: Payer: Commercial Managed Care - PPO | Admitting: Adult Health

## 2021-05-08 ENCOUNTER — Telehealth: Payer: Self-pay | Admitting: Student

## 2021-05-08 NOTE — Telephone Encounter (Signed)
Pt tested negative for covid today, but still experiencing SOB. Says she hasn't done anything different w/her medication, but she wants to speak w/CC about any suggestions in order to reduce symptoms. Pt mentioned something about her ranolazine. Please call her back at [phone].

## 2021-05-08 NOTE — Telephone Encounter (Signed)
Spoke with patient.  She was diagnosed with COVID-19 last week and tested negative for the first time today.  She continues to have shortness of breath.  Patient is presently taking Ranexa 500 mg twice daily, wondering if she should increase this to 1000 mg.  Counseled patient that her shortness of breath is likely multifactorial including due to recent COVID-19 infection.  Advised patient that shortness of breath will likely improve as she recovers from COVID.  Recommended that she may try Ranexa 1000 mg twice daily, with the understanding that shortness of breath may take a while to improve following COVID-19 infection.

## 2021-05-08 NOTE — Telephone Encounter (Signed)
Pt tested negative for covid today, but still experiencing SOB. Says she hasn't done anything different w/her medication, but she wants to speak w/CC about any suggestions in order to reduce symptoms. Pt mentioned something about her ranolazine. Please call her back at [phone]. 

## 2021-05-15 ENCOUNTER — Other Ambulatory Visit: Payer: Self-pay | Admitting: Adult Health

## 2021-05-19 ENCOUNTER — Telehealth: Payer: Self-pay | Admitting: Student

## 2021-05-19 NOTE — Telephone Encounter (Signed)
Will you please call and get some more info and then I can follow up if need be accordingly.

## 2021-05-19 NOTE — Telephone Encounter (Signed)
Pt says she is taking all medications as instructed, but she is still experiencing sob. She wants someone to call her & discuss.

## 2021-05-20 ENCOUNTER — Other Ambulatory Visit: Payer: Self-pay

## 2021-05-20 ENCOUNTER — Ambulatory Visit: Payer: Commercial Managed Care - PPO | Admitting: Student

## 2021-05-20 ENCOUNTER — Encounter: Payer: Self-pay | Admitting: Student

## 2021-05-20 VITALS — BP 146/84 | HR 86 | Temp 97.4°F | Resp 17 | Ht 64.0 in | Wt 201.0 lb

## 2021-05-20 DIAGNOSIS — R002 Palpitations: Secondary | ICD-10-CM

## 2021-05-20 DIAGNOSIS — I10 Essential (primary) hypertension: Secondary | ICD-10-CM

## 2021-05-20 DIAGNOSIS — I951 Orthostatic hypotension: Secondary | ICD-10-CM

## 2021-05-20 MED ORDER — VERAPAMIL HCL ER 240 MG PO TBCR: 120.0000 mg | EXTENDED_RELEASE_TABLET | Freq: Two times a day (BID) | ORAL | 3 refills | Status: DC

## 2021-05-20 NOTE — Telephone Encounter (Signed)
Pt was seen today.

## 2021-05-20 NOTE — Progress Notes (Signed)
Primary Physician/Referring:  Georgianne Fick, MD  Patient ID: Tonya Boyd, female    DOB: 1956-08-03, 65 y.o.   MRN: [id number]  No chief complaint on file.  HPI:    Tonya Boyd  is a 65 y.o. 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  Due to ongoing angina symptoms at last visit patient underwent repeat coronary angiography with successful stenting to proximal RCA and ostial PDA.   Patient presents for urgent visit with concerns of shortness of breath as well as right-sided chest and shoulder pain.  Patient states that over the last 3 days noticed worsening pleuritic pain located at the right 3rd-4th intercostal space in the midclavicular line which is tender to palpation.  She also has right shoulder pain along her scapula which is also tender to palpation.  Since last visit patient has noticed worsening shortness of breath and fatigue over the last 1 week, worse with exertion.  She is also having episodes of dizziness upon standing.  She is currently taking Ranexa 1000 mg twice daily without issue.  Denies palpitations, syncope, near syncope.   Past Medical History:  Diagnosis Date   Angina pectoris (HCC)    Asthma    "as a child and as an adult" (08/02/2018)   CAD (coronary artery disease), native coronary artery 08/03/2018   Chronic bronchitis (HCC)    Chronic kidney disease    Coronary artery disease    Family history of adverse reaction to anesthesia    "cousin stopped breathing; he was allergic to the anesthesia" (08/02/2018)   GERD (gastroesophageal reflux disease)    High cholesterol    History of gout    History of hiatal hernia    History of kidney stones    Hypertension 07/2018    Interstitial cystitis    NSVT (nonsustained ventricular tachycardia) (HCC) 07/2018   Raynaud's disease    "feet and hands" (08/02/2018)   Raynaud's disease without gangrene 10/15/2018   Past Surgical History:  Procedure Laterality Date   ABDOMINAL HYSTERECTOMY     ANTERIOR CERVICAL DECOMP/DISCECTOMY FUSION     APPENDECTOMY     AUGMENTATION MAMMAPLASTY Bilateral    BACK SURGERY     CORONARY ANGIOPLASTY WITH STENT PLACEMENT  08/02/2018   CORONARY STENT INTERVENTION N/A 08/02/2018   Procedure: CORONARY STENT INTERVENTION;  Surgeon: [name], MD;  Location: [facility];  Service: Cardiovascular;  Laterality: N/A;  RCA    CORONARY STENT INTERVENTION N/A 04/08/2021   Procedure: CORONARY STENT INTERVENTION;  Surgeon: [name], MD;  Location: [facility];  Service: Cardiovascular;  Laterality: N/A;  RCA and PDA   CYSTOSCOPY W/ STONE MANIPULATION     INTRAVASCULAR PRESSURE WIRE/FFR STUDY N/A 10/07/2018   Procedure: INTRAVASCULAR PRESSURE WIRE/FFR STUDY;  Surgeon: [name], MD;  Location: [facility];  Service: Cardiovascular;  Laterality: N/A;   KNEE ARTHROSCOPY Right    LAPAROSCOPIC CHOLECYSTECTOMY     LEFT HEART CATH AND CORONARY ANGIOGRAPHY N/A 10/07/2018   Procedure: LEFT HEART CATH AND CORONARY ANGIOGRAPHY;  Surgeon: [name], MD;  Location: [facility];  Service: Cardiovascular;  Laterality: N/A;   LEFT HEART CATH AND CORONARY ANGIOGRAPHY N/A 04/08/2021   Procedure: LEFT HEART CATH AND CORONARY ANGIOGRAPHY;  Surgeon: [name], MD;  Location: [facility] CV [facility];  Service: Cardiovascular;  Laterality: N/A;   REPAIR ANKLE LIGAMENT Left    "tied up ligament; had tore up qthing in my ankle"   RIGHT HEART CATH  10/07/2018   RIGHT/LEFT HEART CATH AND CORONARY ANGIOGRAPHY N/A 08/02/2018   Procedure: RIGHT/LEFT HEART CATH AND CORONARY ANGIOGRAPHY;  Surgeon: [name], MD;  Location: [facility];  Service: Cardiovascular;  Laterality: N/A;   SHOULDER SURGERY      bilateral   TONSILLECTOMY     TUBAL LIGATION     Family History  Problem Relation Age of Onset   Kidney failure Mother    Diabetes Mother    Heart disease Father    Heart attack Father    Colon cancer Brother    Liver cancer Brother    Heart disease Other    Arthritis Other    Cancer Other    Asthma Other    Diabetes Other    Kidney disease Other    Social History   Tobacco Use   Smoking status: Never   Smokeless tobacco: Never  Substance Use Topics   Alcohol use: Not Currently    Comment: 08/02/2018 "not since my 20's"   Marital Status: Married  ROS  Review of Systems  Constitutional: Positive for malaise/fatigue.  Cardiovascular:  Positive for chest pain and dyspnea on exertion. Negative for claudication, leg swelling, near-syncope, orthopnea, palpitations, paroxysmal nocturnal dyspnea and syncope.  Gastrointestinal:  Negative for melena.  Neurological:  Positive for dizziness.  Psychiatric/Behavioral:  Positive for depression. The patient is nervous/anxious.   Objective  Blood pressure (!) 146/84, pulse 86, temperature (!) 97.4 F (36.3 C), temperature source Temporal, resp. rate 17, height 5\' 4"  (1.626 m), weight 201 lb (91.2 kg), SpO2 98 %.  Vitals with BMI 05/20/2021 04/22/2021 04/16/2021  Height 5\' 4"  5\' 4"  5\' 4"   Weight 201 lbs 199 lbs 3 oz 197 lbs  BMI 34.48 34.18 33.8  Systolic 146 120 06/16/2021  Diastolic 84 78 77  Pulse 86 68 86   Orthostatic VS for the past 72 hrs (Last 3 readings):  Orthostatic BP Patient Position BP Location Cuff Size Orthostatic Pulse  05/20/21 1000 108/70 Standing Left Arm Normal 84  05/20/21 0959 138/79 Sitting Left Arm Normal 82  05/20/21 0958 135/75 Supine Left Arm Normal 78     Physical Exam Vitals reviewed.  Constitutional:      Appearance: She is well-developed. She is obese.  HENT:     Head: Normocephalic and atraumatic.  Cardiovascular:     Rate and Rhythm: Normal rate and regular rhythm.     Heart sounds: Normal heart  sounds. No murmur heard.   No gallop.  Pulmonary:     Effort: Pulmonary effort is normal. No respiratory distress.  Musculoskeletal:       Arms:     Right lower leg: No edema.     Left lower leg: No edema.  Skin:    General: Skin is warm and dry.  Neurological:     Mental Status: She is alert.   Laboratory examination:   CMP Latest Ref Rng & Units 04/03/2021 06/13/2019 06/13/2019  Glucose 65 - 99 mg/dL 75 81 81  BUN 8 - 27 mg/dL 25 15 05/22/21)  Creatinine 0.57 - 1.00 mg/dL 04/05/2021) 08/13/2019 08/13/2019  Sodium 134 - 144 mmol/L 140 142 136  Potassium 3.5 - 5.2 mmol/L 4.8 4.0 7.3(HH)  Chloride 96 - 106 mmol/L 101 104 107  CO2 20 - 29 mmol/L 24 26 -  Calcium 8.7 - 10.3 mg/dL 9.9 9.2 -  Total Protein 6.5 - 8.1 g/dL - - -  Total Bilirubin 0.3 - 1.2 mg/dL - - -  Alkaline Phos 38 - 126 U/L - - -  AST 15 - 41 U/L - - -  ALT 0 - 44 U/L - - -   CBC Latest Ref Rng & Units 04/03/2021 06/13/2019 06/13/2019  WBC 3.4 - 10.8 x10E3/uL 12.8(H) - 6.9  Hemoglobin 11.1 - 15.9 g/dL 46.5 15.6(H) 15.0  Hematocrit 34.0 - 46.6 % 41.3 46.0 46.5(H)  Platelets 150 - 450 x10E3/uL 265 - 187   Lipid Panel     Component Value Date/Time   CHOL 141 01/05/2020 0816   TRIG 86 01/05/2020 0816   HDL 61 01/05/2020 0816   LDLCALC 64 01/05/2020 0816   HEMOGLOBIN A1C No results found for: HGBA1C, MPG TSH No results for input(s): TSH in the last 8760 hours.  External labs:   Hemoglobin 14.700 G/ 12/18/2020 Platelets 265.000 X1 12/18/2020  Creatinine, Serum 1.020 MG/ 12/18/2020 Potassium 4.000 mm 06/13/2019 ALT (SGPT) 18.000 IU/ 12/18/2020  TSH 2.100 12/18/2020  Allergies   Allergies  Allergen Reactions   Adhesive [Tape] Other (See Comments)    SKIN WILL TEAR EASILY!!!!   Brilinta [Ticagrelor] Shortness Of Breath   Statins Other (See Comments)    myalgia   Carvedilol     Unknown reaction    Diovan [Valsartan]     Tingling    Hydrochlorothiazide     Unknown reaction    Ibuprofen     Causes bp to go up   Keflex  [Cephalexin]     confusion   Morphine And Related     Hyper, ineffective    Norvasc [Amlodipine Besylate]     Numbness and tingling    Nsaids     Esophagus became red and swollen (tolerates meloxicam)    Oxycodone Itching and Other (See Comments)    Welts, also (Tylox)   Prednisone     Elevated BP   Reglan [Metoclopramide]     confusion   Singulair [Montelukast] Diarrhea    Medications Prior to Visit:   Outpatient Medications Prior to Visit  Medication Sig Dispense Refill   acetaminophen (TYLENOL) 500 MG tablet Take 1,000 mg by mouth every 8 (eight) hours as needed for moderate pain or headache.     albuterol (VENTOLIN HFA) 108 (90 Base) MCG/ACT inhaler Inhale 1 puff into the lungs every 4 (four) hours as needed for wheezing or shortness of breath.     aspirin EC 81 MG tablet Take 81 mg by mouth daily.     clopidogrel (PLAVIX) 75 MG tablet Take 1 tablet (75 mg total) by mouth daily. 90 tablet 3   cyanocobalamin (,VITAMIN B-12,) 1000 MCG/ML injection Inject 1,000 mcg into the skin every 30 (thirty) days.     cyclobenzaprine (FLEXERIL) 10 MG tablet Take 10 mg by mouth at bedtime.     divalproex (DEPAKOTE ER) 250 MG 24 hr tablet TAKE ONE TABLET BY MOUTH ONCE DAILY. 90 tablet 1   Evolocumab (REPATHA SURECLICK) 140 MG/ML SOAJ INJECT 140 MG INTO THE SKIN EVERY 2 WEEKS. 2 mL 6   ezetimibe (ZETIA) 10 MG tablet TAKE 1 TABLET BY MOUTH ONCE DAILY AFTER SUPPER. 30 tablet 6   hydrOXYzine (ATARAX/VISTARIL) 25 MG tablet Take 25 mg by mouth daily as needed for itching.     isosorbide mononitrate (IMDUR) 30 MG 24 hr tablet  Take 1 tablet (30 mg total) by mouth daily at 12 noon. 90 tablet 3   loratadine (CLARITIN) 10 MG tablet Take 10 mg by mouth at bedtime.     nitroGLYCERIN (NITROSTAT) 0.4 MG SL tablet Place 1 tablet (0.4 mg total) under the tongue every 5 (five) minutes as needed for chest pain. 30 tablet 2   oxybutynin (DITROPAN) 5 MG tablet Take 2.5 mg by mouth at bedtime.   1   pantoprazole  (PROTONIX) 20 MG tablet TAKE 1 TABLET BY MOUTH ONCE A DAY. 30 tablet 6   ranolazine (RANEXA) 1000 MG SR tablet Take 1 tablet (1,000 mg total) by mouth 2 (two) times daily. 60 tablet 3   triazolam (HALCION) 0.25 MG tablet Take 0.25 mg by mouth at bedtime.     Vitamin D, Ergocalciferol, (DRISDOL) 1.25 MG (50000 UT) CAPS capsule Take 50,000 Units by mouth every Monday.     labetalol (NORMODYNE) 100 MG tablet Take 1 tablet (100 mg total) by mouth 2 (two) times daily at 10 AM and 5 PM.     verapamil (CALAN-SR) 120 MG CR tablet Take 1 tablet (120 mg total) by mouth 2 (two) times daily. 180 tablet 1   No facility-administered medications prior to visit.   Final Medications at End of Visit    Current Meds  Medication Sig   acetaminophen (TYLENOL) 500 MG tablet Take 1,000 mg by mouth every 8 (eight) hours as needed for moderate pain or headache.   albuterol (VENTOLIN HFA) 108 (90 Base) MCG/ACT inhaler Inhale 1 puff into the lungs every 4 (four) hours as needed for wheezing or shortness of breath.   aspirin EC 81 MG tablet Take 81 mg by mouth daily.   clopidogrel (PLAVIX) 75 MG tablet Take 1 tablet (75 mg total) by mouth daily.   cyanocobalamin (,VITAMIN B-12,) 1000 MCG/ML injection Inject 1,000 mcg into the skin every 30 (thirty) days.   cyclobenzaprine (FLEXERIL) 10 MG tablet Take 10 mg by mouth at bedtime.   divalproex (DEPAKOTE ER) 250 MG 24 hr tablet TAKE ONE TABLET BY MOUTH ONCE DAILY.   Evolocumab (REPATHA SURECLICK) 140 MG/ML SOAJ INJECT 140 MG INTO THE SKIN EVERY 2 WEEKS.   ezetimibe (ZETIA) 10 MG tablet TAKE 1 TABLET BY MOUTH ONCE DAILY AFTER SUPPER.   hydrOXYzine (ATARAX/VISTARIL) 25 MG tablet Take 25 mg by mouth daily as needed for itching.   isosorbide mononitrate (IMDUR) 30 MG 24 hr tablet Take 1 tablet (30 mg total) by mouth daily at 12 noon.   loratadine (CLARITIN) 10 MG tablet Take 10 mg by mouth at bedtime.   nitroGLYCERIN (NITROSTAT) 0.4 MG SL tablet Place 1 tablet (0.4 mg total)  under the tongue every 5 (five) minutes as needed for chest pain.   oxybutynin (DITROPAN) 5 MG tablet Take 2.5 mg by mouth at bedtime.    pantoprazole (PROTONIX) 20 MG tablet TAKE 1 TABLET BY MOUTH ONCE A DAY.   ranolazine (RANEXA) 1000 MG SR tablet Take 1 tablet (1,000 mg total) by mouth 2 (two) times daily.   triazolam (HALCION) 0.25 MG tablet Take 0.25 mg by mouth at bedtime.   Vitamin D, Ergocalciferol, (DRISDOL) 1.25 MG (50000 UT) CAPS capsule Take 50,000 Units by mouth every Monday.   [DISCONTINUED] labetalol (NORMODYNE) 100 MG tablet Take 1 tablet (100 mg total) by mouth 2 (two) times daily at 10 AM and 5 PM.   [DISCONTINUED] verapamil (CALAN-SR) 120 MG CR tablet Take 1 tablet (120 mg total) by mouth 2 (two)  times daily.    Radiology:   Chest X-Ray 01/09/2017: IMPRESSION: No active cardiopulmonary disease.  MR Angio Head WO Contrast 06/13/2019: IMPRESSION: 1. No evidence of ischemia on repeat diffusion imaging. 2. No abnormal enhancement following contrast. 3. Intracranial MRA is within normal limits.  EEG 07/05/19: Normal electroencephalogram, awake, asleep and with activation procedures. There are no focal lateralizing or epileptiform features.   Cardiac Studies:   Lexiscan Sestamibi stress test 07/22/2018:  1. Lexiscan stress test was performed. Exercise capacity was not assessed. Stress symptoms included chest pain, dyspnea, nausea, dizziness. Peak blood pressure was 148/98 mmHg. The resting and stress electrocardiogram demonstrated normal sinus rhythm, normal resting conduction, no resting arrhythmias and normal rest repolarization.  Stress EKG is non diagnostic for ischemia as it is a pharmacologic stress.  2. The overall quality of the study is good.  Left ventricular cavity is noted to be normal on the rest and stress studies.  Gated SPECT images reveal normal myocardial thickening and wall motion.  The left ventricular ejection fraction was calculated 45%, although visually  appears normal. SPECT images reveal small area of mild intensity, reversible perfusion defect in apical inferolateral myocardium, that may represent ischemia.  3. Intermediate risk study due to small perfusion defect and mildly reduced LVEF. Recommend correlation with echocardiogram.   7 day event monitor 07/13/2018-07/19/2018: Dominant rhythm: Normal sinus rhythm.  11 patient activated events occurred with symptoms of chest pain, shortness of breath, rapid heart rate, correlating with sinus tachycardia.  Fastest tachycardia episode was day one at 1400 with 133 beats per without reported symptoms.  When autodetect event of sinus rhythm with 6 beats of NSVT on day 2 10:51 AM with symptoms of chest pain, shortness of breath.  No A. fib or SVT was noted.  Coronary angiogram 10/07/2018: No change from 08/02/2018 except D1 stenosis 50-60%, DFR negative. Normal right heart catheterization. The left ventricular systolic function is normal. LV end diastolic pressure is normal. Superdominant large RCA. Prox RCA to Mid RCA lesion is 80% stenosed S/P STENT ORSIRO DES 4.0X30 with 0% residual stenosis. Ost RPDA lesion is 50% stenosed. Small LAD ends before reaching apex.  PCV ECHOCARDIOGRAM COMPLETE 11/01/2020 Left ventricle cavity is normal in size and wall thickness. Normal global wall motion. Normal LV systolic function with EF 55%. Doppler evidence of grade I (impaired) diastolic dysfunction, normal LAP. Calculated EF 55%. Left atrial cavity is normal in size. Aneurysmal interatrial septum without 2D or color Doppler evidence of shunting. Trileaflet aortic valve.  Trace aortic regurgitation. Mild (Grade I) mitral regurgitation. Normal right atrial pressure. 4X6 cm echolucent area seen in liver, likely s/o liver cyst. Consider dedicated liver ultrasound. Compared to previous study in 2014, liver finding is new. Correlates with MRI of the abdomen done 01/11/2019 revealing multiple liver cysts.  Lexiscan  Nuclear stress test 03/31/2021: Nondiagnostic ECG stress due to pharmacologic stress. Myocardial perfusion is abnormal. There is a fixed moderate defect in the lateral and inferior regions (Scar). There is a reversible moderate defect in the septal and apical regions (Ischemia). Overall LV systolic function is abnormal with Mild inferoseptal and inferolateral severe hypokinesis. Stress LV EF: 41%. High risk study.  Prior study done on 07/22/2018 revealed a small inferolateral and apical defect consistent with ischemia and EF was 45%.  Left heart catheterization 04/08/2021: LV: 153/9, EDP 14 mmHg.  Ao 151/78, mean 109 mmHg.  No pressure gradient across the aortic valve. LM: Large vessel, smooth and normal. LAD: Large vessel in the proximal and, gives  origin to a very large D1 and a large D2 and LAD itself is very small. CX: Moderate caliber vessel, smooth and normal. RI: Very tiny vessel with diffuse disease in the proximal segment. RCA: Dominant vessel.  Gives origin to large PL branch and large PDA.  Proximal to previously placed 3.0 x 30 mm Orsiro stent on 08/02/2020 is patent with mild neointimal hyperplasia.  Proximal to the stent there is a 80% stenosis.  Successful stenting with 4.0 x 16 mm Synergy XD DES = 0% stenosis. Ostial PDA 80% calcific stenosis, successful stenting with 2.5 x 12 mm Synergy XD DES = 0% stenosis. Recommendation: Patient will be discharged home with outpatient follow-up, continued aggressive risk factor modification is indicated.  Hopefully now that she is on PCSK9 inhibitors and lipids are well controlled, she will not have progression of CAD.  125 mL contrast utilized.     EKG   04/16/2021: Sinus rhythm rate of 82 bpm.  Left axis, cannot exclude inferior infarct old.  Incomplete right bundle branch block.  Poor R wave progression, cannot exclude anteroseptal infarct old.  Nonspecific T wave abnormality.  02/14/2021: Normal sinus rhythm at rate of 86 bpm, left axis  deviation, cannot exclude inferior infarct old.  Incomplete right bundle branch block.  Poor R wave progression, cannot exclude anteroseptal infarct old.  Normal QT interval.  No significant change from EKG 10/18/2020.   Assessment     ICD-10-CM   1. Orthostatic hypotension  I95.1     2. Essential hypertension  I10 verapamil (CALAN-SR) 240 MG CR tablet    3. Palpitations  R00.2 verapamil (CALAN-SR) 240 MG CR tablet      Meds ordered this encounter  Medications   verapamil (CALAN-SR) 240 MG CR tablet    Sig: Take 0.5 tablets (120 mg total) by mouth 2 (two) times daily.    Dispense:  90 tablet    Refill:  3     Medications Discontinued During This Encounter  Medication Reason   labetalol (NORMODYNE) 100 MG tablet Discontinued by provider   verapamil (CALAN-SR) 120 MG CR tablet Reorder   Recommendations:   Tonya Boyd  is a 65 y.o. 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   Patient presents for urgent visit with her husband present at bedside.  Patient concerned regarding shortness of breath and dyspnea on exertion.  Patient is orthostatic in the office today.  We will discontinue labetalol and advised patient to monitor blood pressure closely at home.  Also discussed regarding conservative measures to reduce orthostatic symptoms.  Suspect epistasis may be contributing to patient's dizziness and dyspnea on exertion.  We will continue to follow closely.  In regard to patient's chest and back pain symptoms and physical exam are highly consistent with musculoskeletal etiology, reassured patient and advised conservative measures for treatment.  Follow-up in 1 week, sooner if needed, for hypertension, orthostatic  hypotension, and chest/back pain.   Rayford Halsted, PA-C 05/20/2021, 11:32 AM Office: [phone]

## 2021-05-22 ENCOUNTER — Other Ambulatory Visit: Payer: Self-pay | Admitting: Cardiology

## 2021-05-27 NOTE — Progress Notes (Signed)
Primary Physician/Referring:  Georgianne Fick, MD  Patient ID: Tonya Boyd, female    DOB: 1955/12/11, 65 y.o.   MRN: [id number]  Chief Complaint  Patient presents with  . Hypotension  . Follow-up  . Chest Pain    HPI:    Tonya Boyd  is a 65 y.o. 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  Due to ongoing angina symptoms at last visit patient underwent repeat coronary angiography with successful stenting to proximal RCA and ostial PDA.   Patient presents for 1 week follow-up of orthostatic hypotension.  At last 0office visit patient was complaining of chest and back pain suggestive of musculoskeletal etiology.  She was also concerned regarding ongoing dyspnea on exertion, likely related to soft blood pressure.  Last office visit discontinued labetalol.  Patient's blood pressure on home monitoring remains soft and she continues to experience dyspnea on exertion and occasional episodes of chest pain.  Patient states this morning she had left-sided chest pain associated with left arm pain which resolved with sublingual nitroglycerin.  Patient's musculoskeletal right-sided chest pain and back pain has resolved since last visit.  Denies palpitations, syncope, near syncope.   Past Medical History:  Diagnosis Date  . Angina pectoris (HCC)   . Asthma    "as a child and as an adult" (08/02/2018)  . CAD (coronary artery disease), native coronary artery 08/03/2018  . Chronic bronchitis (HCC)   . Chronic kidney disease   . Coronary artery disease   . Family history of adverse reaction to anesthesia    "cousin stopped breathing; he was allergic to the anesthesia" (08/02/2018)  . GERD (gastroesophageal reflux  disease)   . High cholesterol   . History of gout   . History of hiatal hernia   . History of kidney stones   . Hypertension 07/2018  . Interstitial cystitis   . NSVT (nonsustained ventricular tachycardia) (HCC) 07/2018  . Raynaud's disease    "feet and hands" (08/02/2018)  . Raynaud's disease without gangrene 10/15/2018   Past Surgical History:  Procedure Laterality Date  . ABDOMINAL HYSTERECTOMY    . ANTERIOR CERVICAL DECOMP/DISCECTOMY FUSION    . APPENDECTOMY    . AUGMENTATION MAMMAPLASTY Bilateral   . BACK SURGERY    . CORONARY ANGIOPLASTY WITH STENT PLACEMENT  08/02/2018  . CORONARY STENT INTERVENTION N/A 08/02/2018   Procedure: CORONARY STENT INTERVENTION;  Surgeon: [name], MD;  Location: [facility];  Service: Cardiovascular;  Laterality: N/A;  RCA   . CORONARY STENT INTERVENTION N/A 04/08/2021   Procedure: CORONARY STENT INTERVENTION;  Surgeon: [name], MD;  Location: [facility];  Service: Cardiovascular;  Laterality: N/A;  RCA and PDA  . CYSTOSCOPY W/ STONE MANIPULATION    . INTRAVASCULAR PRESSURE WIRE/FFR STUDY N/A 10/07/2018   Procedure: INTRAVASCULAR PRESSURE WIRE/FFR STUDY;  Surgeon: [name], MD;  Location: [facility];  Service: Cardiovascular;  Laterality: N/A;  . KNEE ARTHROSCOPY Right   . LAPAROSCOPIC CHOLECYSTECTOMY    . LEFT HEART CATH AND CORONARY ANGIOGRAPHY N/A 10/07/2018   Procedure: LEFT HEART CATH AND CORONARY ANGIOGRAPHY;  Surgeon: [name], MD;  Location: [facility];  Service: Cardiovascular;  Laterality: N/A;  . LEFT HEART CATH AND CORONARY ANGIOGRAPHY N/A  04/08/2021   Procedure: LEFT HEART CATH AND CORONARY ANGIOGRAPHY;  Surgeon: [name], MD;  Location: [facility];  Service: Cardiovascular;  Laterality: N/A;  . REPAIR ANKLE LIGAMENT Left    "tied up ligament; had tore up qthing in my ankle"  . RIGHT HEART CATH  10/07/2018  . RIGHT/LEFT HEART CATH AND CORONARY ANGIOGRAPHY N/A 08/02/2018   Procedure:  RIGHT/LEFT HEART CATH AND CORONARY ANGIOGRAPHY;  Surgeon: [name], MD;  Location: [facility];  Service: Cardiovascular;  Laterality: N/A;  . SHOULDER SURGERY     bilateral  . TONSILLECTOMY    . TUBAL LIGATION     Family History  Problem Relation Age of Onset  . Kidney failure Mother   . Diabetes Mother   . Heart disease Father   . Heart attack Father   . Colon cancer Brother   . Liver cancer Brother   . Heart disease Other   . Arthritis Other   . Cancer Other   . Asthma Other   . Diabetes Other   . Kidney disease Other    Social History   Tobacco Use  . Smoking status: Never  . Smokeless tobacco: Never  Substance Use Topics  . Alcohol use: Not Currently    Comment: 08/02/2018 "not since my 20's"   Marital Status: Married  ROS  Review of Systems  Constitutional: Positive for malaise/fatigue.  Cardiovascular:  Positive for chest pain (revlieved with NTG) and dyspnea on exertion. Negative for claudication, leg swelling, near-syncope, orthopnea, palpitations, paroxysmal nocturnal dyspnea and syncope.  Gastrointestinal:  Negative for melena.  Neurological:  Positive for dizziness.  Psychiatric/Behavioral:  Positive for depression. The patient is nervous/anxious.   Objective  Blood pressure (!) 150/100, pulse 95, temperature 97.8 F (36.6 C), height 5\' 4"  (1.626 m), weight 200 lb 12.8 oz (91.1 kg), SpO2 96 %.  Vitals with BMI 05/29/2021 05/20/2021 04/22/2021  Height 5\' 4"  5\' 4"  5\' 4"   Weight 200 lbs 13 oz 201 lbs 199 lbs 3 oz  BMI 34.45 34.48 34.18  Systolic 150 146 979  Diastolic 100 84 78  Pulse 95 86 68   Orthostatic VS for the past 72 hrs (Last 3 readings):  Orthostatic BP Patient Position BP Location Cuff Size Orthostatic Pulse  05/29/21 1418 (!) 157/99 Standing Left Arm Large 103  05/29/21 1417 (!) 166/99 Sitting Left Arm Large (!) 49  05/29/21 1416 (!) 179/97 Supine Left Arm Large 91     Physical Exam Vitals reviewed.  Constitutional:       Appearance: She is well-developed. She is obese.  HENT:     Head: Normocephalic and atraumatic.  Cardiovascular:     Rate and Rhythm: Normal rate and regular rhythm.     Heart sounds: Normal heart sounds. No murmur heard.   No gallop.  Pulmonary:     Effort: Pulmonary effort is normal. No respiratory distress.  Musculoskeletal:     Right lower leg: No edema.     Left lower leg: No edema.  Skin:    General: Skin is warm and dry.  Neurological:     Mental Status: She is alert.   Laboratory examination:   CMP Latest Ref Rng & Units 04/03/2021 06/13/2019 06/13/2019  Glucose 65 - 99 mg/dL 75 81 81  BUN 8 - 27 mg/dL 25 15 15(W)  Creatinine 0.57 - 1.00 mg/dL 4.13(S) 4.38 3.77  Sodium 134 - 144 mmol/L 140 142 136  Potassium 3.5 - 5.2 mmol/L 4.8 4.0 7.3(HH)  Chloride 96 - 106 mmol/L 101 104 107  CO2 20 - 29 mmol/L 24 26 -  Calcium 8.7 - 10.3 mg/dL 9.9 9.2 -  Total Protein 6.5 - 8.1 g/dL - - -  Total Bilirubin 0.3 - 1.2 mg/dL - - -  Alkaline Phos 38 - 126 U/L - - -  AST 15 - 41 U/L - - -  ALT 0 - 44 U/L - - -   CBC Latest Ref Rng & Units 04/03/2021 06/13/2019 06/13/2019  WBC 3.4 - 10.8 x10E3/uL 12.8(H) - 6.9  Hemoglobin 11.1 - 15.9 g/dL 95.1 15.6(H) 15.0  Hematocrit 34.0 - 46.6 % 41.3 46.0 46.5(H)  Platelets 150 - 450 x10E3/uL 265 - 187   Lipid Panel     Component Value Date/Time   CHOL 141 01/05/2020 0816   TRIG 86 01/05/2020 0816   HDL 61 01/05/2020 0816   LDLCALC 64 01/05/2020 0816   HEMOGLOBIN A1C No results found for: HGBA1C, MPG TSH No results for input(s): TSH in the last 8760 hours.  External labs:   Hemoglobin 14.700 G/ 12/18/2020 Platelets 265.000 X1 12/18/2020  Creatinine, Serum 1.020 MG/ 12/18/2020 Potassium 4.000 mm 06/13/2019 ALT (SGPT) 18.000 IU/ 12/18/2020  TSH 2.100 12/18/2020  Allergies   Allergies  Allergen Reactions  . Adhesive [Tape] Other (See Comments)    SKIN WILL TEAR EASILY!!!!  . Marden Noble [Ticagrelor] Shortness Of Breath  . Statins Other  (See Comments)    myalgia  . Carvedilol     Unknown reaction   . Diovan [Valsartan]     Tingling   . Hydrochlorothiazide     Unknown reaction   . Ibuprofen     Causes bp to go up  . Keflex [Cephalexin]     confusion  . Morphine And Related     Hyper, ineffective   . Norvasc [Amlodipine Besylate]     Numbness and tingling   . Nsaids     Esophagus became red and swollen (tolerates meloxicam)   . Oxycodone Itching and Other (See Comments)    Welts, also (Tylox)  . Prednisone     Elevated BP  . Reglan [Metoclopramide]     confusion  . Singulair [Montelukast] Diarrhea    Medications Prior to Visit:   Outpatient Medications Prior to Visit  Medication Sig Dispense Refill  . acetaminophen (TYLENOL) 500 MG tablet Take 1,000 mg by mouth every 8 (eight) hours as needed for moderate pain or headache.    . albuterol (VENTOLIN HFA) 108 (90 Base) MCG/ACT inhaler Inhale 1 puff into the lungs every 4 (four) hours as needed for wheezing or shortness of breath.    Marland Kitchen aspirin EC 81 MG tablet Take 81 mg by mouth daily.    . clopidogrel (PLAVIX) 75 MG tablet Take 1 tablet (75 mg total) by mouth daily. 90 tablet 3  . cyanocobalamin (,VITAMIN B-12,) 1000 MCG/ML injection Inject 1,000 mcg into the skin every 30 (thirty) days.    . cyclobenzaprine (FLEXERIL) 10 MG tablet Take 10 mg by mouth at bedtime.    . divalproex (DEPAKOTE ER) 250 MG 24 hr tablet TAKE ONE TABLET BY MOUTH ONCE DAILY. 90 tablet 1  . Evolocumab (REPATHA SURECLICK) 140 MG/ML SOAJ INJECT 140 MG INTO THE SKIN EVERY 2 WEEKS. 2 mL 6  . ezetimibe (ZETIA) 10 MG tablet TAKE 1 TABLET BY MOUTH ONCE DAILY AFTER SUPPER. 30 tablet 6  . hydrOXYzine (ATARAX/VISTARIL) 25 MG tablet Take 25 mg by mouth daily as needed for itching.    Marland Kitchen  isosorbide mononitrate (IMDUR) 30 MG 24 hr tablet Take 1 tablet (30 mg total) by mouth daily at 12 noon. (Patient taking differently: Take 15 mg by mouth daily at 12 noon.) 90 tablet 3  . loratadine (CLARITIN) 10 MG  tablet Take 10 mg by mouth at bedtime.    . nitroGLYCERIN (NITROSTAT) 0.4 MG SL tablet Place 1 tablet (0.4 mg total) under the tongue every 5 (five) minutes as needed for chest pain. 30 tablet 2  . oxybutynin (DITROPAN) 5 MG tablet Take 2.5 mg by mouth at bedtime.   1  . pantoprazole (PROTONIX) 20 MG tablet TAKE 1 TABLET BY MOUTH ONCE A DAY. 30 tablet 0  . ranolazine (RANEXA) 1000 MG SR tablet Take 1 tablet (1,000 mg total) by mouth 2 (two) times daily. 60 tablet 3  . triazolam (HALCION) 0.25 MG tablet Take 0.25 mg by mouth at bedtime.    . Vitamin D, Ergocalciferol, (DRISDOL) 1.25 MG (50000 UT) CAPS capsule Take 50,000 Units by mouth every Monday.    . verapamil (CALAN-SR) 240 MG CR tablet Take 0.5 tablets (120 mg total) by mouth 2 (two) times daily. 90 tablet 3   No facility-administered medications prior to visit.   Final Medications at End of Visit    Current Meds  Medication Sig  . acebutolol (SECTRAL) 200 MG capsule Take 1 capsule (200 mg total) by mouth 2 (two) times daily.  Marland Kitchen acetaminophen (TYLENOL) 500 MG tablet Take 1,000 mg by mouth every 8 (eight) hours as needed for moderate pain or headache.  . albuterol (VENTOLIN HFA) 108 (90 Base) MCG/ACT inhaler Inhale 1 puff into the lungs every 4 (four) hours as needed for wheezing or shortness of breath.  Marland Kitchen aspirin EC 81 MG tablet Take 81 mg by mouth daily.  . clopidogrel (PLAVIX) 75 MG tablet Take 1 tablet (75 mg total) by mouth daily.  . cyanocobalamin (,VITAMIN B-12,) 1000 MCG/ML injection Inject 1,000 mcg into the skin every 30 (thirty) days.  . cyclobenzaprine (FLEXERIL) 10 MG tablet Take 10 mg by mouth at bedtime.  . divalproex (DEPAKOTE ER) 250 MG 24 hr tablet TAKE ONE TABLET BY MOUTH ONCE DAILY.  Marland Kitchen Evolocumab (REPATHA SURECLICK) 140 MG/ML SOAJ INJECT 140 MG INTO THE SKIN EVERY 2 WEEKS.  . ezetimibe (ZETIA) 10 MG tablet TAKE 1 TABLET BY MOUTH ONCE DAILY AFTER SUPPER.  . hydrOXYzine (ATARAX/VISTARIL) 25 MG tablet Take 25 mg by mouth  daily as needed for itching.  . isosorbide mononitrate (IMDUR) 30 MG 24 hr tablet Take 1 tablet (30 mg total) by mouth daily at 12 noon. (Patient taking differently: Take 15 mg by mouth daily at 12 noon.)  . loratadine (CLARITIN) 10 MG tablet Take 10 mg by mouth at bedtime.  . nitroGLYCERIN (NITROSTAT) 0.4 MG SL tablet Place 1 tablet (0.4 mg total) under the tongue every 5 (five) minutes as needed for chest pain.  Marland Kitchen oxybutynin (DITROPAN) 5 MG tablet Take 2.5 mg by mouth at bedtime.   . pantoprazole (PROTONIX) 20 MG tablet TAKE 1 TABLET BY MOUTH ONCE A DAY.  . ranolazine (RANEXA) 1000 MG SR tablet Take 1 tablet (1,000 mg total) by mouth 2 (two) times daily.  . triazolam (HALCION) 0.25 MG tablet Take 0.25 mg by mouth at bedtime.  . Vitamin D, Ergocalciferol, (DRISDOL) 1.25 MG (50000 UT) CAPS capsule Take 50,000 Units by mouth every Monday.  . [DISCONTINUED] verapamil (CALAN-SR) 240 MG CR tablet Take 0.5 tablets (120 mg total) by mouth 2 (two) times daily.  Radiology:   Chest X-Ray 01/09/2017: IMPRESSION: No active cardiopulmonary disease.  MR Angio Head WO Contrast 06/13/2019: IMPRESSION: 1. No evidence of ischemia on repeat diffusion imaging. 2. No abnormal enhancement following contrast. 3. Intracranial MRA is within normal limits.  EEG 07/05/19: Normal electroencephalogram, awake, asleep and with activation procedures. There are no focal lateralizing or epileptiform features.   Cardiac Studies:   Lexiscan Sestamibi stress test 07/22/2018:  1. Lexiscan stress test was performed. Exercise capacity was not assessed. Stress symptoms included chest pain, dyspnea, nausea, dizziness. Peak blood pressure was 148/98 mmHg. The resting and stress electrocardiogram demonstrated normal sinus rhythm, normal resting conduction, no resting arrhythmias and normal rest repolarization.  Stress EKG is non diagnostic for ischemia as it is a pharmacologic stress.  2. The overall quality of the study is good.   Left ventricular cavity is noted to be normal on the rest and stress studies.  Gated SPECT images reveal normal myocardial thickening and wall motion.  The left ventricular ejection fraction was calculated 45%, although visually appears normal. SPECT images reveal small area of mild intensity, reversible perfusion defect in apical inferolateral myocardium, that may represent ischemia.  3. Intermediate risk study due to small perfusion defect and mildly reduced LVEF. Recommend correlation with echocardiogram.   7 day event monitor 07/13/2018-07/19/2018: Dominant rhythm: Normal sinus rhythm.  11 patient activated events occurred with symptoms of chest pain, shortness of breath, rapid heart rate, correlating with sinus tachycardia.  Fastest tachycardia episode was day one at 1400 with 133 beats per without reported symptoms.  When autodetect event of sinus rhythm with 6 beats of NSVT on day 2 10:51 AM with symptoms of chest pain, shortness of breath.  No A. fib or SVT was noted.  Coronary angiogram 10/07/2018: No change from 08/02/2018 except D1 stenosis 50-60%, DFR negative. Normal right heart catheterization. The left ventricular systolic function is normal. LV end diastolic pressure is normal. Superdominant large RCA. Prox RCA to Mid RCA lesion is 80% stenosed S/P STENT ORSIRO DES 4.0X30 with 0% residual stenosis. Ost RPDA lesion is 50% stenosed. Small LAD ends before reaching apex.  PCV ECHOCARDIOGRAM COMPLETE 11/01/2020 Left ventricle cavity is normal in size and wall thickness. Normal global wall motion. Normal LV systolic function with EF 55%. Doppler evidence of grade I (impaired) diastolic dysfunction, normal LAP. Calculated EF 55%. Left atrial cavity is normal in size. Aneurysmal interatrial septum without 2D or color Doppler evidence of shunting. Trileaflet aortic valve.  Trace aortic regurgitation. Mild (Grade I) mitral regurgitation. Normal right atrial pressure. 4X6 cm echolucent area  seen in liver, likely s/o liver cyst. Consider dedicated liver ultrasound. Compared to previous study in 2014, liver finding is new. Correlates with MRI of the abdomen done 01/11/2019 revealing multiple liver cysts.  Lexiscan Nuclear stress test 03/31/2021: Nondiagnostic ECG stress due to pharmacologic stress. Myocardial perfusion is abnormal. There is a fixed moderate defect in the lateral and inferior regions (Scar). There is a reversible moderate defect in the septal and apical regions (Ischemia). Overall LV systolic function is abnormal with Mild inferoseptal and inferolateral severe hypokinesis. Stress LV EF: 41%. High risk study.  Prior study done on 07/22/2018 revealed a small inferolateral and apical defect consistent with ischemia and EF was 45%.  Left heart catheterization 04/08/2021: LV: 153/9, EDP 14 mmHg.  Ao 151/78, mean 109 mmHg.  No pressure gradient across the aortic valve. LM: Large vessel, smooth and normal. LAD: Large vessel in the proximal and, gives origin to a very large  D1 and a large D2 and LAD itself is very small. CX: Moderate caliber vessel, smooth and normal. RI: Very tiny vessel with diffuse disease in the proximal segment. RCA: Dominant vessel.  Gives origin to large PL branch and large PDA.  Proximal to previously placed 3.0 x 30 mm Orsiro stent on 08/02/2020 is patent with mild neointimal hyperplasia.  Proximal to the stent there is a 80% stenosis.  Successful stenting with 4.0 x 16 mm Synergy XD DES = 0% stenosis. Ostial PDA 80% calcific stenosis, successful stenting with 2.5 x 12 mm Synergy XD DES = 0% stenosis. Recommendation: Patient will be discharged home with outpatient follow-up, continued aggressive risk factor modification is indicated.  Hopefully now that she is on PCSK9 inhibitors and lipids are well controlled, she will not have progression of CAD.  125 mL contrast utilized.     EKG  05/29/2021: Sinus rhythm at a rate of 91 bpm.  Left axis, left anterior  fascicular block.  Cannot exclude inferior infarct old.  Incomplete right bundle branch block.  Poor R wave progression, cannot exclude anteroseptal infarct old.  Diffuse nonspecific T wave abnormality.  04/16/2021: Sinus rhythm rate of 82 bpm.  Left axis, cannot exclude inferior infarct old.  Incomplete right bundle branch block.  Poor R wave progression, cannot exclude anteroseptal infarct old.  Nonspecific T wave abnormality.  02/14/2021: Normal sinus rhythm at rate of 86 bpm, left axis deviation, cannot exclude inferior infarct old.  Incomplete right bundle branch block.  Poor R wave progression, cannot exclude anteroseptal infarct old.  Normal QT interval.  No significant change from EKG 10/18/2020.   Assessment     ICD-10-CM   1. Coronary artery disease of native artery of native heart with stable angina pectoris (HCC)  I25.118 EKG 12-Lead    2. Orthostatic hypotension  I95.1     3. Dyspnea on exertion  R06.00     4. Essential hypertension  I10       Meds ordered this encounter  Medications  . acebutolol (SECTRAL) 200 MG capsule    Sig: Take 1 capsule (200 mg total) by mouth 2 (two) times daily.    Dispense:  60 capsule    Refill:  3      Medications Discontinued During This Encounter  Medication Reason  . verapamil (CALAN-SR) 240 MG CR tablet Change in therapy    Recommendations:   PEGGYE Boyd  is a 65 y.o. 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   Patient presents for 1 week follow-up of orthostatic hypotension.  At last office visit patient was complaining of chest and back pain suggestive of musculoskeletal etiology.  She was also concerned regarding ongoing dyspnea on exertion, likely related  to soft blood pressure.  Last office visit discontinued labetalol.  Patient's blood pressure is elevated in the office today, however she brings with her home blood pressure readings which remain soft.  Notably her heart rate is also elevated compared to baseline.  Patient continues to have symptoms of dyspnea and chest discomfort, however she recently had cardiac catheterization on 04/08/2021 with successful PCI.  Given recent cardiac catheterization do not stress test is indicated at this time.  Reassured patient.  Will discontinue verapamil and switch patient to acebutolol 200  mg twice daily.  We will continue to monitor patient's blood pressure and heart rate closely.  Also advised patient to try stopping Ranexa for 3 to 4 days, and advised she may discontinue completely if she notices no change in symptoms with stopping Ranexa.  For now we will continue sublingual nitroglycerin use as needed, however could consider switching her to nitroglycerin paste in the future as her pain may be related to underlying coronary vasospasm.  Follow-up in 3 weeks, sooner if needed.   Patient was seen in collaboration with Dr. Jacinto Halim. He also reviewed patient's chart and examined the patient. Dr. Jacinto Halim is in agreement of the plan.    Rayford Halsted, PA-C 05/29/2021, 2:58 PM Office: [phone]

## 2021-05-29 ENCOUNTER — Other Ambulatory Visit: Payer: Self-pay

## 2021-05-29 ENCOUNTER — Ambulatory Visit: Payer: Commercial Managed Care - PPO | Admitting: Student

## 2021-05-29 ENCOUNTER — Encounter: Payer: Self-pay | Admitting: Student

## 2021-05-29 VITALS — BP 150/100 | HR 95 | Temp 97.8°F | Ht 64.0 in | Wt 200.8 lb

## 2021-05-29 DIAGNOSIS — I951 Orthostatic hypotension: Secondary | ICD-10-CM

## 2021-05-29 DIAGNOSIS — R0609 Other forms of dyspnea: Secondary | ICD-10-CM

## 2021-05-29 DIAGNOSIS — R0600 Dyspnea, unspecified: Secondary | ICD-10-CM

## 2021-05-29 DIAGNOSIS — I25118 Atherosclerotic heart disease of native coronary artery with other forms of angina pectoris: Secondary | ICD-10-CM

## 2021-05-29 DIAGNOSIS — I10 Essential (primary) hypertension: Secondary | ICD-10-CM

## 2021-05-29 MED ORDER — ACEBUTOLOL HCL 200 MG PO CAPS: 200.0000 mg | ORAL_CAPSULE | Freq: Two times a day (BID) | ORAL | 3 refills | Status: DC

## 2021-06-03 ENCOUNTER — Other Ambulatory Visit: Payer: Self-pay

## 2021-06-03 ENCOUNTER — Ambulatory Visit
Admission: RE | Admit: 2021-06-03 | Discharge: 2021-06-03 | Disposition: A | Discharge status: Home / Self Care | Payer: Commercial Managed Care - PPO | Source: Ambulatory Visit | Attending: Internal Medicine | Admitting: Internal Medicine

## 2021-06-03 DIAGNOSIS — Z1231 Encounter for screening mammogram for malignant neoplasm of breast: Secondary | ICD-10-CM

## 2021-06-14 ENCOUNTER — Other Ambulatory Visit: Payer: Self-pay | Admitting: Cardiology

## 2021-06-19 ENCOUNTER — Ambulatory Visit: Payer: Commercial Managed Care - PPO | Admitting: Student

## 2021-06-19 ENCOUNTER — Other Ambulatory Visit: Payer: Self-pay

## 2021-06-19 ENCOUNTER — Encounter: Payer: Self-pay | Admitting: Student

## 2021-06-19 VITALS — BP 137/83 | HR 81 | Temp 97.6°F | Ht 64.0 in | Wt 196.0 lb

## 2021-06-19 DIAGNOSIS — I25118 Atherosclerotic heart disease of native coronary artery with other forms of angina pectoris: Secondary | ICD-10-CM

## 2021-06-19 DIAGNOSIS — I951 Orthostatic hypotension: Secondary | ICD-10-CM

## 2021-06-19 NOTE — Progress Notes (Signed)
Primary Physician/Referring:  Georgianne Fick, MD  Patient ID: Tonya Boyd, female    DOB: 1955/11/13, 65 y.o.   MRN: [id number]  Chief Complaint  Patient presents with   Coronary Artery Disease   Follow-up    HPI:    Tonya Boyd  is a 65 y.o. 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  Due to ongoing angina symptoms at last visit patient underwent repeat coronary angiography with successful stenting to proximal RCA and ostial PDA.   Patient presents for 3-week follow-up.  Last office visit switch patient from verapamil to his pedal 20 mg twice daily and advised patient to try stopping Ranexa.  Patient is feeling much improved since last office visit.  She has had no recurrence of dizziness or fatigue.  Also no recurrence of chest pain.  She has discontinued both Ranexa and Imdur.  Home blood pressure readings are averaging 130/80 mmHg.  Denies palpitations, syncope, near syncope.  Past Medical History:  Diagnosis Date   Angina pectoris (HCC)    Asthma    "as a child and as an adult" (08/02/2018)   CAD (coronary artery disease), native coronary artery 08/03/2018   Chronic bronchitis (HCC)    Chronic kidney disease    Coronary artery disease    Family history of adverse reaction to anesthesia    "cousin stopped breathing; he was allergic to the anesthesia" (08/02/2018)   GERD (gastroesophageal reflux disease)    High cholesterol    History of gout    History of hiatal hernia    History of kidney stones    Hypertension 07/2018   Interstitial cystitis    NSVT (nonsustained ventricular tachycardia) 07/2018   Raynaud's disease    "feet and hands" (08/02/2018)   Raynaud's disease without gangrene  10/15/2018   Past Surgical History:  Procedure Laterality Date   ABDOMINAL HYSTERECTOMY     ANTERIOR CERVICAL DECOMP/DISCECTOMY FUSION     APPENDECTOMY     AUGMENTATION MAMMAPLASTY Bilateral    BACK SURGERY     CORONARY ANGIOPLASTY WITH STENT PLACEMENT  08/02/2018   CORONARY STENT INTERVENTION N/A 08/02/2018   Procedure: CORONARY STENT INTERVENTION;  Surgeon: [name], MD;  Location: [facility];  Service: Cardiovascular;  Laterality: N/A;  RCA    CORONARY STENT INTERVENTION N/A 04/08/2021   Procedure: CORONARY STENT INTERVENTION;  Surgeon: [name], MD;  Location: [facility];  Service: Cardiovascular;  Laterality: N/A;  RCA and PDA   CYSTOSCOPY W/ STONE MANIPULATION     INTRAVASCULAR PRESSURE WIRE/FFR STUDY N/A 10/07/2018   Procedure: INTRAVASCULAR PRESSURE WIRE/FFR STUDY;  Surgeon: [name], MD;  Location: [facility];  Service: Cardiovascular;  Laterality: N/A;   KNEE ARTHROSCOPY Right    LAPAROSCOPIC CHOLECYSTECTOMY     LEFT HEART CATH AND CORONARY ANGIOGRAPHY N/A 10/07/2018   Procedure: LEFT HEART CATH AND CORONARY ANGIOGRAPHY;  Surgeon: [name], MD;  Location: [facility];  Service: Cardiovascular;  Laterality: N/A;   LEFT HEART CATH AND CORONARY ANGIOGRAPHY N/A 04/08/2021   Procedure: LEFT HEART CATH AND CORONARY ANGIOGRAPHY;  Surgeon: [name], MD;  Location: [facility];  Service: Cardiovascular;  Laterality: N/A;   REPAIR ANKLE LIGAMENT Left    "tied up  ligament; had tore up qthing in my ankle"   RIGHT HEART CATH  10/07/2018   RIGHT/LEFT HEART CATH AND CORONARY ANGIOGRAPHY N/A 08/02/2018   Procedure: RIGHT/LEFT HEART CATH AND CORONARY ANGIOGRAPHY;  Surgeon: [name], MD;  Location: [facility];  Service: Cardiovascular;  Laterality: N/A;   SHOULDER SURGERY     bilateral   TONSILLECTOMY     TUBAL LIGATION     Family History  Problem Relation Age of Onset   Kidney failure Mother    Diabetes Mother    Heart disease Father     Heart attack Father    Colon cancer Brother    Liver cancer Brother    Heart disease Other    Arthritis Other    Cancer Other    Asthma Other    Diabetes Other    Kidney disease Other    Social History   Tobacco Use   Smoking status: Never   Smokeless tobacco: Never  Substance Use Topics   Alcohol use: Not Currently    Comment: 08/02/2018 "not since my 20's"   Marital Status: Married  ROS  Review of Systems  Constitutional: Negative for malaise/fatigue.  Cardiovascular:  Negative for chest pain, claudication, dyspnea on exertion, leg swelling, near-syncope, orthopnea, palpitations, paroxysmal nocturnal dyspnea and syncope.  Gastrointestinal:  Negative for melena.  Neurological:  Positive for dizziness.  Psychiatric/Behavioral:  Positive for depression. The patient is nervous/anxious.   Objective  Blood pressure 137/83, pulse 81, temperature 97.6 F (36.4 C), temperature source Temporal, height  (1.626 m), weight 196 lb (88.9 kg), SpO2 98 %.  Vitals with BMI 06/19/2021 05/29/2021 05/20/2021  Height     Weight 196 lbs 200 lbs 13 oz 201 lbs  BMI 33.63 34.45 34.48  Systolic 137 150 147  Diastolic 83 100 84  Pulse 81 95 86   Orthostatic VS for the past 72 hrs (Last 3 readings):  Patient Position BP Location Cuff Size  06/19/21 1348 Sitting Left Arm Normal     Physical Exam Vitals reviewed.  Constitutional:      Appearance: She is well-developed. She is obese.  HENT:     Head: Normocephalic and atraumatic.  Cardiovascular:     Rate and Rhythm: Normal rate and regular rhythm.     Heart sounds: Normal heart sounds. No murmur heard.   No gallop.  Pulmonary:     Effort: Pulmonary effort is normal. No respiratory distress.  Musculoskeletal:     Right lower leg: No edema.     Left lower leg: No edema.  Skin:    General: Skin is warm and dry.  Neurological:     Mental Status: She is alert.  Physical exam unchanged compared to previous office  visit.  Laboratory examination:   CMP Latest Ref Rng & Units 04/03/2021 06/13/2019 06/13/2019  Glucose 65 - 99 mg/dL 75 81 81  BUN 8 - 27 mg/dL 25 15 82(N)  Creatinine 0.57 - 1.00 mg/dL 5.62(Z) 3.08 6.57  Sodium 134 - 144 mmol/L 140 142 136  Potassium 3.5 - 5.2 mmol/L 4.8 4.0 7.3(HH)  Chloride 96 - 106 mmol/L 101 104 107  CO2 20 - 29 mmol/L 24 26 -  Calcium 8.7 - 10.3 mg/dL 9.9 9.2 -  Total Protein 6.5 - 8.1 g/dL - - -  Total Bilirubin 0.3 - 1.2 mg/dL - - -  Alkaline Phos 38 - 126 U/L - - -  AST 15 - 41 U/L - - -  ALT 0 - 44 U/L - - -   CBC Latest Ref Rng & Units 04/03/2021 06/13/2019 06/13/2019  WBC 3.4 - 10.8 x10E3/uL 12.8(H) - 6.9  Hemoglobin 11.1 - 15.9 g/dL 80.9 15.6(H) 15.0  Hematocrit 34.0 - 46.6 % 41.3 46.0 46.5(H)  Platelets 150 - 450 x10E3/uL 265 - 187   Lipid Panel     Component Value Date/Time   CHOL 141 01/05/2020 0816   TRIG 86 01/05/2020 0816   HDL 61 01/05/2020 0816   LDLCALC 64 01/05/2020 0816   HEMOGLOBIN A1C No results found for: HGBA1C, MPG TSH No results for input(s): TSH in the last 8760 hours.  External labs:   Hemoglobin 14.700 G/ 12/18/2020 Platelets 265.000 X1 12/18/2020  Creatinine, Serum 1.020 MG/ 12/18/2020 Potassium 4.000 mm 06/13/2019 ALT (SGPT) 18.000 IU/ 12/18/2020  TSH 2.100 12/18/2020  Allergies   Allergies  Allergen Reactions   Adhesive [Tape] Other (See Comments)    SKIN WILL TEAR EASILY!!!!   Brilinta [Ticagrelor] Shortness Of Breath   Statins Other (See Comments)    myalgia   Carvedilol     Unknown reaction    Diovan [Valsartan]     Tingling    Hydrochlorothiazide     Unknown reaction    Ibuprofen     Causes bp to go up   Keflex [Cephalexin]     confusion   Morphine And Related     Hyper, ineffective    Norvasc [Amlodipine Besylate]     Numbness and tingling    Nsaids     Esophagus became red and swollen (tolerates meloxicam)    Oxycodone Itching and Other (See Comments)    Welts, also (Tylox)   Prednisone      Elevated BP   Reglan [Metoclopramide]     confusion   Singulair [Montelukast] Diarrhea    Medications Prior to Visit:   Outpatient Medications Prior to Visit  Medication Sig Dispense Refill   acebutolol (SECTRAL) 200 MG capsule Take 1 capsule (200 mg total) by mouth 2 (two) times daily. 60 capsule 3   acetaminophen (TYLENOL) 500 MG tablet Take 1,000 mg by mouth every 8 (eight) hours as needed for moderate pain or headache.     albuterol (VENTOLIN HFA) 108 (90 Base) MCG/ACT inhaler Inhale 1 puff into the lungs every 4 (four) hours as needed for wheezing or shortness of breath.     aspirin EC 81 MG tablet Take 81 mg by mouth daily.     clopidogrel (PLAVIX) 75 MG tablet Take 1 tablet (75 mg total) by mouth daily. 90 tablet 3   cyanocobalamin (,VITAMIN B-12,) 1000 MCG/ML injection Inject 1,000 mcg into the skin every 30 (thirty) days.     cyclobenzaprine (FLEXERIL) 10 MG tablet Take 10 mg by mouth at bedtime.     divalproex (DEPAKOTE ER) 250 MG 24 hr tablet TAKE ONE TABLET BY MOUTH ONCE DAILY. 90 tablet 1   ezetimibe (ZETIA) 10 MG tablet TAKE 1 TABLET BY MOUTH ONCE DAILY AFTER SUPPER. 30 tablet 6   hydrOXYzine (ATARAX/VISTARIL) 25 MG tablet Take 25 mg by mouth daily as needed for itching.     loratadine (CLARITIN) 10 MG tablet Take 10 mg by mouth at bedtime.     nitroGLYCERIN (NITROSTAT) 0.4 MG SL tablet Place 1 tablet (0.4 mg total) under the tongue every 5 (five) minutes as needed for chest pain. 30 tablet 2   oxybutynin (DITROPAN) 5 MG tablet Take 2.5 mg by mouth at bedtime.   1   pantoprazole (  PROTONIX) 20 MG tablet TAKE 1 TABLET BY MOUTH ONCE A DAY. 30 tablet 0   REPATHA SURECLICK 140 MG/ML SOAJ INJECT 140 MG INTO THE SKIN EVERY 2 WEEKS. 2 mL 0   triazolam (HALCION) 0.25 MG tablet Take 0.25 mg by mouth at bedtime.     Vitamin D, Ergocalciferol, (DRISDOL) 1.25 MG (50000 UT) CAPS capsule Take 50,000 Units by mouth every Monday.     isosorbide mononitrate (IMDUR) 30 MG 24 hr tablet Take 1  tablet (30 mg total) by mouth daily at 12 noon. (Patient not taking: Reported on 06/19/2021) 90 tablet 3   ranolazine (RANEXA) 1000 MG SR tablet Take 1 tablet (1,000 mg total) by mouth 2 (two) times daily. (Patient not taking: Reported on 06/19/2021) 60 tablet 3   No facility-administered medications prior to visit.   Final Medications at End of Visit    Current Meds  Medication Sig   acebutolol (SECTRAL) 200 MG capsule Take 1 capsule (200 mg total) by mouth 2 (two) times daily.   acetaminophen (TYLENOL) 500 MG tablet Take 1,000 mg by mouth every 8 (eight) hours as needed for moderate pain or headache.   albuterol (VENTOLIN HFA) 108 (90 Base) MCG/ACT inhaler Inhale 1 puff into the lungs every 4 (four) hours as needed for wheezing or shortness of breath.   aspirin EC 81 MG tablet Take 81 mg by mouth daily.   clopidogrel (PLAVIX) 75 MG tablet Take 1 tablet (75 mg total) by mouth daily.   cyanocobalamin (,VITAMIN B-12,) 1000 MCG/ML injection Inject 1,000 mcg into the skin every 30 (thirty) days.   cyclobenzaprine (FLEXERIL) 10 MG tablet Take 10 mg by mouth at bedtime.   divalproex (DEPAKOTE ER) 250 MG 24 hr tablet TAKE ONE TABLET BY MOUTH ONCE DAILY.   ezetimibe (ZETIA) 10 MG tablet TAKE 1 TABLET BY MOUTH ONCE DAILY AFTER SUPPER.   hydrOXYzine (ATARAX/VISTARIL) 25 MG tablet Take 25 mg by mouth daily as needed for itching.   loratadine (CLARITIN) 10 MG tablet Take 10 mg by mouth at bedtime.   nitroGLYCERIN (NITROSTAT) 0.4 MG SL tablet Place 1 tablet (0.4 mg total) under the tongue every 5 (five) minutes as needed for chest pain.   oxybutynin (DITROPAN) 5 MG tablet Take 2.5 mg by mouth at bedtime.    pantoprazole (PROTONIX) 20 MG tablet TAKE 1 TABLET BY MOUTH ONCE A DAY.   REPATHA SURECLICK 140 MG/ML SOAJ INJECT 140 MG INTO THE SKIN EVERY 2 WEEKS.   triazolam (HALCION) 0.25 MG tablet Take 0.25 mg by mouth at bedtime.   Vitamin D, Ergocalciferol, (DRISDOL) 1.25 MG (50000 UT) CAPS capsule Take  50,000 Units by mouth every Monday.    Radiology:   Chest X-Ray 01/09/2017: IMPRESSION: No active cardiopulmonary disease.  MR Angio Head WO Contrast 06/13/2019: IMPRESSION: 1. No evidence of ischemia on repeat diffusion imaging. 2. No abnormal enhancement following contrast. 3. Intracranial MRA is within normal limits.  EEG 07/05/19: Normal electroencephalogram, awake, asleep and with activation procedures. There are no focal lateralizing or epileptiform features.   Cardiac Studies:   Lexiscan Sestamibi stress test 07/22/2018:  1. Lexiscan stress test was performed. Exercise capacity was not assessed. Stress symptoms included chest pain, dyspnea, nausea, dizziness. Peak blood pressure was 148/98 mmHg. The resting and stress electrocardiogram demonstrated normal sinus rhythm, normal resting conduction, no resting arrhythmias and normal rest repolarization.  Stress EKG is non diagnostic for ischemia as it is a pharmacologic stress.  2. The overall quality of the study is good.  Left ventricular cavity is noted to be normal on the rest and stress studies.  Gated SPECT images reveal normal myocardial thickening and wall motion.  The left ventricular ejection fraction was calculated 45%, although visually appears normal. SPECT images reveal small area of mild intensity, reversible perfusion defect in apical inferolateral myocardium, that may represent ischemia.  3. Intermediate risk study due to small perfusion defect and mildly reduced LVEF. Recommend correlation with echocardiogram.   7 day event monitor 07/13/2018-07/19/2018: Dominant rhythm: Normal sinus rhythm.  11 patient activated events occurred with symptoms of chest pain, shortness of breath, rapid heart rate, correlating with sinus tachycardia.  Fastest tachycardia episode was day one at 1400 with 133 beats per without reported symptoms.  When autodetect event of sinus rhythm with 6 beats of NSVT on day 2 10:51 AM with symptoms of chest  pain, shortness of breath.  No A. fib or SVT was noted.  Coronary angiogram 10/07/2018: No change from 08/02/2018 except D1 stenosis 50-60%, DFR negative. Normal right heart catheterization. The left ventricular systolic function is normal. LV end diastolic pressure is normal. Superdominant large RCA. Prox RCA to Mid RCA lesion is 80% stenosed S/P STENT ORSIRO DES 4.0X30 with 0% residual stenosis. Ost RPDA lesion is 50% stenosed. Small LAD ends before reaching apex.  PCV ECHOCARDIOGRAM COMPLETE 11/01/2020 Left ventricle cavity is normal in size and wall thickness. Normal global wall motion. Normal LV systolic function with EF 55%. Doppler evidence of grade I (impaired) diastolic dysfunction, normal LAP. Calculated EF 55%. Left atrial cavity is normal in size. Aneurysmal interatrial septum without 2D or color Doppler evidence of shunting. Trileaflet aortic valve.  Trace aortic regurgitation. Mild (Grade I) mitral regurgitation. Normal right atrial pressure. 4X6 cm echolucent area seen in liver, likely s/o liver cyst. Consider dedicated liver ultrasound. Compared to previous study in 2014, liver finding is new. Correlates with MRI of the abdomen done 01/11/2019 revealing multiple liver cysts.  Lexiscan Nuclear stress test 03/31/2021: Nondiagnostic ECG stress due to pharmacologic stress. Myocardial perfusion is abnormal. There is a fixed moderate defect in the lateral and inferior regions (Scar). There is a reversible moderate defect in the septal and apical regions (Ischemia). Overall LV systolic function is abnormal with Mild inferoseptal and inferolateral severe hypokinesis. Stress LV EF: 41%. High risk study.  Prior study done on 07/22/2018 revealed a small inferolateral and apical defect consistent with ischemia and EF was 45%.  Left heart catheterization 04/08/2021: LV: 153/9, EDP 14 mmHg.  Ao 151/78, mean 109 mmHg.  No pressure gradient across the aortic valve. LM: Large vessel, smooth and  normal. LAD: Large vessel in the proximal and, gives origin to a very large D1 and a large D2 and LAD itself is very small. CX: Moderate caliber vessel, smooth and normal. RI: Very tiny vessel with diffuse disease in the proximal segment. RCA: Dominant vessel.  Gives origin to large PL branch and large PDA.  Proximal to previously placed 3.0 x 30 mm Orsiro stent on 08/02/2020 is patent with mild neointimal hyperplasia.  Proximal to the stent there is a 80% stenosis.  Successful stenting with 4.0 x 16 mm Synergy XD DES = 0% stenosis. Ostial PDA 80% calcific stenosis, successful stenting with 2.5 x 12 mm Synergy XD DES = 0% stenosis. Recommendation: Patient will be discharged home with outpatient follow-up, continued aggressive risk factor modification is indicated.  Hopefully now that she is on PCSK9 inhibitors and lipids are well controlled, she will not have progression of CAD.  125 mL contrast utilized.     EKG  05/29/2021: Sinus rhythm at a rate of 91 bpm.  Left axis, left anterior fascicular block.  Cannot exclude inferior infarct old.  Incomplete right bundle branch block.  Poor R wave progression, cannot exclude anteroseptal infarct old.  Diffuse nonspecific T wave abnormality.  04/16/2021: Sinus rhythm rate of 82 bpm.  Left axis, cannot exclude inferior infarct old.  Incomplete right bundle branch block.  Poor R wave progression, cannot exclude anteroseptal infarct old.  Nonspecific T wave abnormality.  02/14/2021: Normal sinus rhythm at rate of 86 bpm, left axis deviation, cannot exclude inferior infarct old.  Incomplete right bundle branch block.  Poor R wave progression, cannot exclude anteroseptal infarct old.  Normal QT interval.  No significant change from EKG 10/18/2020.   Assessment     ICD-10-CM   1. Coronary artery disease of native artery of native heart with stable angina pectoris (HCC)  I25.118     2. Orthostatic hypotension  I95.1       No orders of the defined types were  placed in this encounter.     Medications Discontinued During This Encounter  Medication Reason   ranolazine (RANEXA) 1000 MG SR tablet Discontinued by provider   isosorbide mononitrate (IMDUR) 30 MG 24 hr tablet Discontinued by provider    Recommendations:   Tonya Boyd  is a 65 y.o. 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   Patient presents for 3-week follow-up.  Last office visit switch patient from verapamil to his pedal 20 mg twice daily and advised patient to try stopping Ranexa.  Patient is feeling well since last office visit.  She has had no recurrence of dizziness or chest pain.  She has also discontinued both Ranexa and Imdur.  Patient's blood pressure is well controlled in the office.  Will not make changes to medications at this time.  Patient is otherwise stable from a cardiovascular standpoint.  Follow-up in 6 months, sooner if needed, for CAD.   Rayford Halsted, PA-C 06/19/2021, 2:49 PM Office: [phone]

## 2021-06-24 ENCOUNTER — Other Ambulatory Visit: Payer: Self-pay | Admitting: Student

## 2021-06-24 NOTE — Progress Notes (Signed)
External labs 06/13/2021: BUN 15, creatinine 1.03, GFR 61, sodium 140, potassium 4.6, Total cholesterol 73, triglycerides 96, HDL 44, LDL 10

## 2021-06-30 ENCOUNTER — Ambulatory Visit (INDEPENDENT_AMBULATORY_CARE_PROVIDER_SITE_OTHER): Payer: Commercial Managed Care - PPO | Admitting: Adult Health

## 2021-06-30 ENCOUNTER — Encounter: Payer: Self-pay | Admitting: Adult Health

## 2021-06-30 ENCOUNTER — Other Ambulatory Visit: Payer: Self-pay

## 2021-06-30 VITALS — BP 150/90 | HR 79 | Ht 65.0 in | Wt 195.0 lb

## 2021-06-30 DIAGNOSIS — R2990 Unspecified symptoms and signs involving the nervous system: Secondary | ICD-10-CM | POA: Diagnosis not present

## 2021-06-30 DIAGNOSIS — G43009 Migraine without aura, not intractable, without status migrainosus: Secondary | ICD-10-CM | POA: Diagnosis not present

## 2021-06-30 MED ORDER — DIVALPROEX SODIUM ER 250 MG PO TB24: 250.0000 mg | ORAL_TABLET | Freq: Every day | ORAL | 3 refills | Status: DC

## 2021-06-30 NOTE — Progress Notes (Signed)
[facility] [address] [place]. [place] [address] [phone]       OFFICE FOLLOW UP NOTE  Tonya Boyd Date of Birth:  03/07/1956 Medical Record Number:  [id number]   Primary neurologist: Dr. Pearlean Brownie Reason for Referral: Stroke like episode   Chief Complaint  Patient presents with   Follow-up    Rm 3 alone  Pt is well, occasional R arm weakness. No new concerns        HPI:   Update 06/30/2021 JM: Returns for overdue 49-month follow-up unaccompanied. She missed prior visit due to having COVID in August - recovered well. Overall stable from neurological standpoint without new neurological symptoms.  She reports continued occasional right arm weakness (difficulty gripping) and occasional pain. Recently obtained R AFO brace for right foot weakness which she reports has been ongoing since her stroke. Did have a right foot fracture 12/2020 but feels like she recovered well form this. Routinely followed by ortho for right knee pain. Continues to use cane at all times - denies any recent falls. She remains on social security disability. Remains on Depakote ER 250 mg daily - denies side effects. She has only had 2-3 headaches since prior visit. Will improve after closing eyes and use of ice. Compliant on aspirin, Repatha and Zetia without side effects as well as plavix for s/p proximal RCA and ostial PDA stenting 04/08/2021 by Dr. Jacinto Halim.  Continues to be routinely followed by cardiology. Blood pressure today 150/90. Is not currently monitoring at home. Medications changes recently due to hypotension.  No further concerns at this time.    History provided for reference purposes only Update 11/04/2020 JM: Tonya Boyd returns for scheduled 47-month follow-up.  Doing well from a stroke standpoint without new or reoccurring stroke/TIA symptoms Residual right-sided weakness, speech impairment and cognitive impairment stable  Reports compliance on aspirin 81 mg daily without  any bruising Reports compliance on Repatha and cardiology recently initiated Zetia 10 mg daily -denies side effects and plans on repeating lipid panel in April with PCP Blood pressure today 144/89  On Depakote ER 250 mg daily for migraine prophylaxis She will occassionally have migraines which have slightly worsened over the past few months likely in setting of family stressors including the passing of her brother at the end of January after being told of a cancer diagnosis in December as well as putting her dog down.  She does have family support as well as close friends and very active in her church.  No new concerns at this time.  Update 05/07/2020 JM: Tonya Boyd returns for stroke like episode follow-up unaccompanied Continues to experience right hemiparesis, speech impairment and cognitive deficit which have been stable She did have formal neurocognitive evaluation by Dr. 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  Reported changes to speech and language, reduced encoding and new learning, slowed processing speed, lateralized motor deficits and memory dysfunction are likely residual symptoms of prior infarct. Denies new stroke/TIA symptoms Continues on aspirin 81 mg daily and Repatha for secondary stroke prevention without side effects Dr. Jacinto Halim manages repatha and did recently have lipid panel completed with LDL 64 Blood pressure today 140/85 -monitors at home which has been stable Remains on Depakote ER 250 mg daily for migraine prophylaxis.  She denies any recent migraine occurrence. Does have have occasional mild headaches but typically associated with motion Was having increased headaches likely due to elevated blood pressure and after medication adjustments by Dr. Jacinto Halim,  blood pressures improved and worsening headaches resolved No further concerns at this time  Virtual visit 01/01/2020 JM: Tonya Boyd is is being seen via virtual visit for  follow-up regarding prior strokelike episode in 06/2019.  Residual symptoms of right sided weakness, speech impairment with childlike quality and cognitive deficit which have been stable without worsening. Evaluated by Dr. Kieth Brightly on 12/26/2019 and plans on undergoing neurocognitive evaluation today.  Continues on Depakote 250 mg daily for ongoing migraine prophylaxis with benefit.  Previously on 500 mg dosage and denies any worsening with lowering dose.  Mild occasional headaches but denies migrainous headaches.  Continues on aspirin 81 mg daily and Repatha for stroke prevention.  Continues to follow with Dr. Jacinto Halim, cardiology, routinely.  No concerns at this time.  Update 08/15/2019: Tonya Boyd is a 65 year old female who is being seen today for follow-up accompanied by her husband.  She continues to have speech difficulty with hesitancy and childlike quality.  She also continues to complain of cognition and memory difficulties with getting confused easily as well as subjective right-sided weakness and numbness.  She continues to ambulate with a cane and does endorse a couple falls due to tripping on her right foot due to reported weakness.  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  Currently on short-term disability by PCP and plans on pursuing disability in the near future.  She has since completed therapies with reports of improvement of prior deficits.  After prior visit, underwent EEG which was normal. She was started on Depakote 500 mg daily at prior visit due to potential complicated migraine versus seizure activity which she continues on without difficulty.  She denies reoccurring headaches or worsening of symptoms.  She does endorse occasional depression or sadness which has been more present thinking of her daughter that passed away 5 years ago.  She also endorses increased stress/anxiety at times that can worsen her cognition and speech.   She continues on aspirin 81 mg daily without bleeding or bruising.  Previously prescribed Repatha but due to joint pain, this has been discontinued and plans on following with cardiology for initiation of a different type of cholesterol lowering agent.    Continues to follow with cardiology regularly for HTN and HLD management.  Blood pressure today 125/82.  No further concerns at this time.    Initial visit 06/15/2019 Dr. Pearlean Brownie: Tonya Boyd is a 65 year old Caucasian lady seen today for initial office consultation visit.  She is accompanied by her husband.  History is obtained from them, review of electronic medical records and I personally reviewed imaging films in PACS.  She has past medical history of hypertension, hyperlipidemia, coronary artery disease, kidney stones, hiatal hernia, gastroesophageal reflux disease, chronic bronchitis.  She states she had episode in early September when she woke up she felt she could not think right and she had brain fog she was off balance numbness on the right side of the face and tongue as well as right hand she is went and saw her primary care physician who ordered an outpatient MRI scan of the brain done on 05/17/2019 [facility] which I personally reviewed showed no acute abnormalities.  Mild changes of chronic small vessel disease only noted.  She was seen in the ER on 06/08/2019 with sudden onset of speech change and right-sided weakness.  She also had some chest pain in the morning and took some nitroglycerin.  She also had  a headache.  She states she does not get many headaches.  CT scan of the head in the ER was unremarkable.  She had some subjective speech difficulties and right-sided weakness with giveaway weakness as per Dr. Alene Mires exam.  MRI scan of the brain was obtained which showed no acute abnormality and only changes of small vessel disease.  MRI of the brain showed no significant large vessel stenosis..  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   Patient states symptoms have persisted since then.  She is able to speak now but has trouble speaking and speaks in with a childlike quality to her speech.  Still has subjective weakness and numbness on the right side but is able to walk without assistance.  She states she cannot think because she has brain fog and will not be able to return to work.  She feels better.  She denies any prior known history of strokes TIAs or seizures.  She does take aspirin every day and is on medication for blood pressure and cholesterol for her coronary artery disease.  She denies prior history of migraines or headaches with neurological symptoms.  She denies any significant ongoing stress in her life.  Her daughter died 5 years ago but she states she is coping with that.  Patient feels she is unable to work because she gets confused and the brain cannot think right.  She has in fact not even been driving.      ROS:   14 system review of systems is positive for those listed in HPI, all other systems negative   PMH:  Past Medical History:  Diagnosis Date   Angina pectoris (HCC)    Asthma    "as a child and as an adult" (08/02/2018)   CAD (coronary artery disease), native coronary artery 08/03/2018   Chronic bronchitis (HCC)    Chronic kidney disease    Coronary artery disease    Family history of adverse reaction to anesthesia    "cousin stopped breathing; he was allergic to the anesthesia" (08/02/2018)   GERD (gastroesophageal reflux disease)    High cholesterol    History of gout    History of hiatal hernia    History of kidney stones    Hypertension 07/2018   Interstitial cystitis    NSVT (nonsustained ventricular tachycardia) 07/2018   Raynaud's disease    "feet and hands" (08/02/2018)   Raynaud's disease without gangrene 10/15/2018    Social History:  Social History   Socioeconomic History   Marital status: Married    Spouse name: Not on file   Number of  children: 3   Years of education: GED   Highest education level: Not on file  Occupational History    Employer: [employer]  Tobacco Use   Smoking status: Never   Smokeless tobacco: Never  Vaping Use   Vaping Use: Never used  Substance and Sexual Activity   Alcohol use: Not Currently    Comment: 08/02/2018 "not since my 20's"   Drug use: Never   Sexual activity: Not Currently  Other Topics Concern   Not on file  Social History Narrative   One child deceased   Social Determinants of Health   Financial Resource Strain: Not on file  Food Insecurity: Not on file  Transportation Needs: Not on file  Physical Activity: Not on file  Stress: Not on file  Social Connections: Not on file  Intimate Partner Violence: Not on file  Medications:   Current Outpatient Medications on File Prior to Visit  Medication Sig Dispense Refill   acebutolol (SECTRAL) 200 MG capsule Take 1 capsule (200 mg total) by mouth 2 (two) times daily. 60 capsule 3   acetaminophen (TYLENOL) 500 MG tablet Take 1,000 mg by mouth every 8 (eight) hours as needed for moderate pain or headache.     albuterol (VENTOLIN HFA) 108 (90 Base) MCG/ACT inhaler Inhale 1 puff into the lungs every 4 (four) hours as needed for wheezing or shortness of breath.     aspirin EC 81 MG tablet Take 81 mg by mouth daily.     clopidogrel (PLAVIX) 75 MG tablet Take 1 tablet (75 mg total) by mouth daily. 90 tablet 3   cyanocobalamin (,VITAMIN B-12,) 1000 MCG/ML injection Inject 1,000 mcg into the skin every 30 (thirty) days.     cyclobenzaprine (FLEXERIL) 10 MG tablet Take 10 mg by mouth at bedtime.     divalproex (DEPAKOTE ER) 250 MG 24 hr tablet TAKE ONE TABLET BY MOUTH ONCE DAILY. 90 tablet 1   ezetimibe (ZETIA) 10 MG tablet TAKE 1 TABLET BY MOUTH ONCE DAILY AFTER SUPPER. 30 tablet 6   hydrOXYzine (ATARAX/VISTARIL) 25 MG tablet Take 25 mg by mouth daily as needed for itching.     loratadine (CLARITIN) 10 MG tablet Take  10 mg by mouth at bedtime.     nitroGLYCERIN (NITROSTAT) 0.4 MG SL tablet Place 1 tablet (0.4 mg total) under the tongue every 5 (five) minutes as needed for chest pain. 30 tablet 2   oxybutynin (DITROPAN) 5 MG tablet Take 2.5 mg by mouth at bedtime.   1   pantoprazole (PROTONIX) 20 MG tablet TAKE 1 TABLET BY MOUTH ONCE A DAY. 30 tablet 0   REPATHA SURECLICK 140 MG/ML SOAJ INJECT 140 MG INTO THE SKIN EVERY 2 WEEKS. 2 mL 0   triazolam (HALCION) 0.25 MG tablet Take 0.25 mg by mouth at bedtime.     Vitamin D, Ergocalciferol, (DRISDOL) 1.25 MG (50000 UT) CAPS capsule Take 50,000 Units by mouth every Monday.     No current facility-administered medications on file prior to visit.    Allergies:   Allergies  Allergen Reactions   Adhesive [Tape] Other (See Comments)    SKIN WILL TEAR EASILY!!!!   Brilinta [Ticagrelor] Shortness Of Breath   Statins Other (See Comments)    myalgia   Carvedilol     Unknown reaction    Diovan [Valsartan]     Tingling    Hydrochlorothiazide     Unknown reaction    Ibuprofen     Causes bp to go up   Keflex [Cephalexin]     confusion   Morphine And Related     Hyper, ineffective    Norvasc [Amlodipine Besylate]     Numbness and tingling    Nsaids     Esophagus became red and swollen (tolerates meloxicam)    Oxycodone Itching and Other (See Comments)    Welts, also (Tylox)   Prednisone     Elevated BP   Reglan [Metoclopramide]     confusion   Singulair [Montelukast] Diarrhea     Today's Vitals   06/30/21 0831  BP: (!) 150/90  Pulse: 79  Weight: 195 lb (88.5 kg)  Height: 5\' 5"  (1.651 m)    Body mass index is 32.45 kg/m.   Physical Exam General: well developed, well nourished,  pleasant middle-aged Caucasian female, seated, in no evident distress Head: head normocephalic and atraumatic.  Neck: supple with no carotid or supraclavicular bruits Cardiovascular: regular rate and rhythm, no murmurs Musculoskeletal: no deformity Skin:  no  rash/petichiae Vascular:  Normal pulses all extremities   Neurologic Exam Mental Status: Awake and fully alert. Childlike quality of speech but no evidence of true dysarthria or aphasia.  Oriented to place and time. Recent memory impaired and remote memory intact. Attention span, concentration and fund of knowledge appropriate during visit. Mood and affect appropriate.  Cranial Nerves: Pupils equal, briskly reactive to light. Extraocular movements full without nystagmus. Visual fields full to confrontation. Hearing intact. Facial sensation intact. Face, tongue, palate moves normally and symmetrically.  Motor: Normal bulk and tone.  Normal strength left upper and lower extremity.  Unable to appreciate weakness on exam although difficulty fully assessing right upper and lower extremity due to poor effort and giveaway weakness Sensory.:  Diminished touch, pinprick, position and vibratory sensation on right hemibody including forehead with splitting of midline Coordination: Rapid alternating movements normal in all extremities. Finger-to-nose and heel-to-shin performed accurately on left side.  She was unable to perform testing on the right side due to subjective weakness and abnormal RUE movement not consistent with apraxia or dysmetria Gait and Station: Arises from chair without difficulty and without assistance. Stance is normal. Gait demonstrates normal stride length and balance with use of cane and AFO brace Reflexes: 1+ and symmetric. Toes downgoing.       ASSESSMENT/PLAN: 21 year patient with speech difficulty, right sided weakness and numbness from stroke like episode of unclear etiology possibly conversion disorder. MRI brain x 2 negative for stroke. Exam shows nonorganic features with subjective right-sided weakness, sensory impairment, abnormal speech, abnormal RUE movement and cognitive impairment. Vascular risk factors of chronic migraines, CAD s/p angioplasty to right RCA 07/2018 and s/p  stent proximal right RCA and ostial PDA, HTN and HLD with statin intolerance.     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 -Continue aspirin 81 mg daily and Repatha and zetia for secondary stroke prevention -Continue to follow PCP/cardiology for aggressive stroke risk factor management including HTN with BP goal<130/90 and HLD with LDL goal<70  Migraines -Continue Depakote ER 250 mg daily for migraine prophylaxis -refill provided -May consider stopped at next f/u visit - recently ran out of rx for a few days and noticed worsening of headaches at that time -Recent CMP and CBC satisfactory    Follow-up in 1 year or earlier if needed   CC:  Georgianne Fick, MD    I spent 28 minutes of face-to-face and non-face-to-face time with patient.  This included previsit chart review, lab review, study review, order entry, electronic health record documentation, patient education regarding strokelike episode vs conversion disorder with residual deficits, managing stroke risk factors, ongoing use of Depakote for migraine prophylaxis and answered all other questions to patient satisfaction   Ihor Austin, AGNP-BC  [facility] [address] [place], [place] [address]  Phone [phone] Fax [phone] Note: This document was prepared with digital dictation and possible smart phrase technology. Any transcriptional errors that result from this process are unintentional.

## 2021-06-30 NOTE — Patient Instructions (Addendum)
Continue Depakote ER 250mg  daily for headache prevention  Continue aspirin 81 mg daily  and Repatha and Zetia for secondary stroke prevention  Continue to follow up with PCP regarding cholesterol and blood pressure management  Maintain strict control of hypertension with blood pressure goal below 130/90 and cholesterol with LDL cholesterol (bad cholesterol) goal below 70 mg/dL.      Followup in the future with me in 1 year or call earlier if needed      Thank you for coming to see at [facility]. I hope we have been able to provide you high quality care today.  You may receive a patient satisfaction survey over the next few weeks. We would appreciate your feedback and comments so that we may continue to improve ourselves and the health of our patients.

## 2021-07-15 DIAGNOSIS — F5101 Primary insomnia: Secondary | ICD-10-CM | POA: Diagnosis not present

## 2021-07-15 DIAGNOSIS — I25118 Atherosclerotic heart disease of native coronary artery with other forms of angina pectoris: Secondary | ICD-10-CM | POA: Diagnosis not present

## 2021-07-15 DIAGNOSIS — G459 Transient cerebral ischemic attack, unspecified: Secondary | ICD-10-CM | POA: Diagnosis not present

## 2021-07-15 DIAGNOSIS — I10 Essential (primary) hypertension: Secondary | ICD-10-CM | POA: Diagnosis not present

## 2021-07-15 DIAGNOSIS — E782 Mixed hyperlipidemia: Secondary | ICD-10-CM | POA: Diagnosis not present

## 2021-07-23 ENCOUNTER — Ambulatory Visit: Payer: Commercial Managed Care - PPO | Admitting: Student

## 2021-07-25 ENCOUNTER — Other Ambulatory Visit: Payer: Self-pay | Admitting: Student

## 2021-07-25 ENCOUNTER — Other Ambulatory Visit: Payer: Self-pay | Admitting: Cardiology

## 2021-07-25 DIAGNOSIS — E7800 Pure hypercholesterolemia, unspecified: Secondary | ICD-10-CM

## 2021-07-27 ENCOUNTER — Emergency Department (HOSPITAL_COMMUNITY): Payer: Medicare Other

## 2021-07-27 ENCOUNTER — Other Ambulatory Visit: Payer: Self-pay

## 2021-07-27 ENCOUNTER — Emergency Department (HOSPITAL_COMMUNITY)
Admission: EM | Admit: 2021-07-27 | Discharge: 2021-07-27 | Disposition: A | Discharge status: Home / Self Care | Payer: Medicare Other | Attending: Emergency Medicine | Admitting: Emergency Medicine

## 2021-07-27 DIAGNOSIS — N189 Chronic kidney disease, unspecified: Secondary | ICD-10-CM | POA: Insufficient documentation

## 2021-07-27 DIAGNOSIS — Z79899 Other long term (current) drug therapy: Secondary | ICD-10-CM | POA: Diagnosis not present

## 2021-07-27 DIAGNOSIS — I2510 Atherosclerotic heart disease of native coronary artery without angina pectoris: Secondary | ICD-10-CM | POA: Diagnosis not present

## 2021-07-27 DIAGNOSIS — R319 Hematuria, unspecified: Secondary | ICD-10-CM | POA: Insufficient documentation

## 2021-07-27 DIAGNOSIS — Z87442 Personal history of urinary calculi: Secondary | ICD-10-CM | POA: Diagnosis not present

## 2021-07-27 DIAGNOSIS — I129 Hypertensive chronic kidney disease with stage 1 through stage 4 chronic kidney disease, or unspecified chronic kidney disease: Secondary | ICD-10-CM | POA: Insufficient documentation

## 2021-07-27 DIAGNOSIS — J45909 Unspecified asthma, uncomplicated: Secondary | ICD-10-CM | POA: Insufficient documentation

## 2021-07-27 DIAGNOSIS — K5730 Diverticulosis of large intestine without perforation or abscess without bleeding: Secondary | ICD-10-CM | POA: Diagnosis not present

## 2021-07-27 DIAGNOSIS — M5136 Other intervertebral disc degeneration, lumbar region: Secondary | ICD-10-CM | POA: Diagnosis not present

## 2021-07-27 DIAGNOSIS — R300 Dysuria: Secondary | ICD-10-CM | POA: Insufficient documentation

## 2021-07-27 DIAGNOSIS — Z7982 Long term (current) use of aspirin: Secondary | ICD-10-CM | POA: Diagnosis not present

## 2021-07-27 DIAGNOSIS — K7689 Other specified diseases of liver: Secondary | ICD-10-CM | POA: Diagnosis not present

## 2021-07-27 DIAGNOSIS — R309 Painful micturition, unspecified: Secondary | ICD-10-CM | POA: Diagnosis not present

## 2021-07-27 DIAGNOSIS — R109 Unspecified abdominal pain: Secondary | ICD-10-CM | POA: Insufficient documentation

## 2021-07-27 LAB — COMPREHENSIVE METABOLIC PANEL
ALT: 16 U/L (ref 0–44)
AST: 20 U/L (ref 15–41)
Albumin: 3.9 g/dL (ref 3.5–5.0)
Alkaline Phosphatase: 87 U/L (ref 38–126)
Anion gap: 6 (ref 5–15)
BUN: 16 mg/dL (ref 8–23)
CO2: 29 mmol/L (ref 22–32)
Calcium: 9 mg/dL (ref 8.9–10.3)
Chloride: 101 mmol/L (ref 98–111)
Creatinine, Ser: 0.99 mg/dL (ref 0.44–1.00)
GFR, Estimated: >60 mL/min (ref >60)
Glucose, Bld: 85 mg/dL (ref 70–99)
Potassium: 4.2 mmol/L (ref 3.5–5.1)
Sodium: 136 mmol/L (ref 135–145)
Total Bilirubin: 0.6 mg/dL (ref 0.3–1.2)
Total Protein: 7.4 g/dL (ref 6.5–8.1)

## 2021-07-27 LAB — URINALYSIS, ROUTINE W REFLEX MICROSCOPIC
Bacteria, UA: NONE SEEN
Bilirubin Urine: NEGATIVE
Glucose, UA: NEGATIVE mg/dL
Ketones, ur: NEGATIVE mg/dL
Leukocytes,Ua: NEGATIVE
Nitrite: NEGATIVE
Protein, ur: NEGATIVE mg/dL
Specific Gravity, Urine: 1.006 (ref 1.005–1.030)
pH: 6 (ref 5.0–8.0)

## 2021-07-27 LAB — CBC WITH DIFFERENTIAL/PLATELET
Abs Immature Granulocytes: 0.01 10*3/uL (ref 0.00–0.07)
Basophils Absolute: 0.1 10*3/uL (ref 0.0–0.1)
Basophils Relative: 1 %
Eosinophils Absolute: 0.1 10*3/uL (ref 0.0–0.5)
Eosinophils Relative: 2 %
HCT: 42.6 % (ref 36.0–46.0)
Hemoglobin: 13.7 g/dL (ref 12.0–15.0)
Immature Granulocytes: 0 %
Lymphocytes Relative: 21 %
Lymphs Abs: 1.2 10*3/uL (ref 0.7–4.0)
MCH: 31.4 pg (ref 26.0–34.0)
MCHC: 32.2 g/dL (ref 30.0–36.0)
MCV: 97.5 fL (ref 80.0–100.0)
Monocytes Absolute: 0.6 10*3/uL (ref 0.1–1.0)
Monocytes Relative: 9 %
Neutro Abs: 4 10*3/uL (ref 1.7–7.7)
Neutrophils Relative %: 67 %
Platelets: 248 10*3/uL (ref 150–400)
RBC: 4.37 MIL/uL (ref 3.87–5.11)
RDW: 13.1 % (ref 11.5–15.5)
WBC: 6 10*3/uL (ref 4.0–10.5)
nRBC: 0 % (ref 0.0–0.2)

## 2021-07-27 MED ORDER — SODIUM CHLORIDE 0.9 % IV BOLUS
500.0000 mL | Freq: Once | INTRAVENOUS | Status: AC
  Administered 2021-07-27: 500 mL via INTRAVENOUS

## 2021-07-27 MED ORDER — KETOROLAC TROMETHAMINE 30 MG/ML IJ SOLN
15.0000 mg | Freq: Once | INTRAMUSCULAR | Status: AC
  Administered 2021-07-27: 15 mg via INTRAVENOUS
  Filled 2021-07-27: qty 1

## 2021-07-27 MED ORDER — PHENAZOPYRIDINE HCL 95 MG PO TABS: 95.0000 mg | ORAL_TABLET | Freq: Three times a day (TID) | ORAL | 0 refills | Status: AC | PRN

## 2021-07-27 NOTE — ED Provider Notes (Signed)
[facility] EMERGENCY DEPARTMENT Provider Note   CSN: [id number] Arrival date & time: 07/27/21  1144     History Chief Complaint  Patient presents with   Dysuria   Flank Pain    GAVIN FAIVRE is a 65 y.o. female.  HPI  65 year old female with past medical history of HTN, HLD, interstitial cystitis, kidney stones presents emergency department left-sided flank pain and dysuria.  Patient states been going on intermittently for the past week, worse yesterday.  When urinating yesterday she had pain, hematuria and thought she passed a small kidney stone.  She is now complaining of some soreness with passing urine and some mild achiness in the left flank.  Denies any fever, nausea/vomiting/diarrhea.  States the last time she passed a kidney stone was around 15 years ago.  Past Medical History:  Diagnosis Date   Angina pectoris (HCC)    Asthma    "as a child and as an adult" (08/02/2018)   CAD (coronary artery disease), native coronary artery 08/03/2018   Chronic bronchitis (HCC)    Chronic kidney disease    Coronary artery disease    Family history of adverse reaction to anesthesia    "cousin stopped breathing; he was allergic to the anesthesia" (08/02/2018)   GERD (gastroesophageal reflux disease)    High cholesterol    History of gout    History of hiatal hernia    History of kidney stones    Hypertension 07/2018   Interstitial cystitis    NSVT (nonsustained ventricular tachycardia) 07/2018   Raynaud's disease    "feet and hands" (08/02/2018)   Raynaud's disease without gangrene 10/15/2018    Patient Active Problem List   Diagnosis Date Noted   Raynaud's disease without gangrene 10/15/2018   Angina pectoris (HCC) 10/07/2018   Essential hypertension 08/03/2018   CAD (coronary artery disease), native coronary artery 08/03/2018   Post PTCA 08/02/2018   Elevated IgE level 07/19/2018   Dyspnea on exertion 07/19/2018   Long-term exposure involving bird droppings  07/19/2018   Iliotibial band syndrome of left side 10/21/2011    Past Surgical History:  Procedure Laterality Date   ABDOMINAL HYSTERECTOMY     ANTERIOR CERVICAL DECOMP/DISCECTOMY FUSION     APPENDECTOMY     AUGMENTATION MAMMAPLASTY Bilateral    BACK SURGERY     CORONARY ANGIOPLASTY WITH STENT PLACEMENT  08/02/2018   CORONARY STENT INTERVENTION N/A 08/02/2018   Procedure: CORONARY STENT INTERVENTION;  Surgeon: [name], MD;  Location: [facility] IN[facility]AS[facility];  Service: Cardiovascular;  Laterality: N/A;  RCA    CORONARY STENT INTERVENTION N/A 04/08/2021   Procedure: CORONARY STENT INTERVENTION;  Surgeon: [name], MD;  Location: [facility];  Service: Cardiovascular;  Laterality: N/A;  RCA and PDA   CYSTOSCOPY W/ STONE MANIPULATION     INTRAVASCULAR PRESSURE WIRE/FFR STUDY N/A 10/07/2018   Procedure: INTRAVASCULAR PRESSURE WIRE/FFR STUDY;  Surgeon: [name], MD;  Location: [facility];  Service: Cardiovascular;  Laterality: N/A;   KNEE ARTHROSCOPY Right    LAPAROSCOPIC CHOLECYSTECTOMY     LEFT HEART CATH AND CORONARY ANGIOGRAPHY N/A 10/07/2018   Procedure: LEFT HEART CATH AND CORONARY ANGIOGRAPHY;  Surgeon: [name], MD;  Location: [facility];  Service: Cardiovascular;  Laterality: N/A;   LEFT HEART CATH AND CORONARY ANGIOGRAPHY N/A 04/08/2021   Procedure: LEFT HEART CATH AND CORONARY ANGIOGRAPHY;  Surgeon: [name], MD;  Location: [facility];  Service: Cardiovascular;  Laterality: N/A;   REPAIR  ANKLE LIGAMENT Left    "tied up ligament; had tore up qthing in my ankle"   RIGHT HEART CATH  10/07/2018   RIGHT/LEFT HEART CATH AND CORONARY ANGIOGRAPHY N/A 08/02/2018   Procedure: RIGHT/LEFT HEART CATH AND CORONARY ANGIOGRAPHY;  Surgeon: Adrian Prows, MD;  Location: [facility];  Service: Cardiovascular;  Laterality: N/A;   SHOULDER SURGERY     bilateral   TONSILLECTOMY     TUBAL LIGATION       OB History   No obstetric history on file.     Family  History  Problem Relation Age of Onset   Kidney failure Mother    Diabetes Mother    Heart disease Father    Heart attack Father    Colon cancer Brother    Liver cancer Brother    Heart disease Other    Arthritis Other    Cancer Other    Asthma Other    Diabetes Other    Kidney disease Other     Social History   Tobacco Use   Smoking status: Never   Smokeless tobacco: Never  Vaping Use   Vaping Use: Never used  Substance Use Topics   Alcohol use: Not Currently    Comment: 08/02/2018 "not since my 20's"   Drug use: Never    Home Medications Prior to Admission medications   Medication Sig Start Date End Date Taking? Authorizing Provider  phenazopyridine (PYRIDIUM) 95 MG tablet Take 1 tablet (95 mg total) by mouth 3 (three) times daily as needed for up to 2 days for pain. 07/27/21 07/29/21 Yes Toy Samarin, Alvin Critchley, DO  traZODone (DESYREL) 50 MG tablet 1-2 tablet at bedtime as needed 07/15/21  Yes [provider]  acebutolol (SECTRAL) 200 MG capsule Take 1 capsule (200 mg total) by mouth 2 (two) times daily. 05/29/21   Cantwell, Celeste C, PA-C  acetaminophen (TYLENOL) 500 MG tablet Take 1,000 mg by mouth every 8 (eight) hours as needed for moderate pain or headache.    [provider]  albuterol (VENTOLIN HFA) 108 (90 Base) MCG/ACT inhaler Inhale 1 puff into the lungs every 4 (four) hours as needed for wheezing or shortness of breath.    [provider]  aspirin EC 81 MG tablet Take 81 mg by mouth daily.    [provider]  clopidogrel (PLAVIX) 75 MG tablet Take 1 tablet (75 mg total) by mouth daily. 04/16/21   Cantwell, Celeste C, PA-C  clopidogrel (PLAVIX) 75 MG tablet 1 tablet    [provider]  cyanocobalamin (,VITAMIN B-12,) 1000 MCG/ML injection Inject 1,000 mcg into the skin every 30 (thirty) days. 01/31/21   [provider]  cyclobenzaprine (FLEXERIL) 10 MG tablet Take 10 mg by mouth at bedtime.    [provider]   divalproex (DEPAKOTE ER) 250 MG 24 hr tablet Take 1 tablet (250 mg total) by mouth daily. 06/30/21   Frann Rider, NP  ezetimibe (ZETIA) 10 MG tablet TAKE 1 TABLET BY MOUTH ONCE DAILY AFTER SUPPER. 07/25/21   Adrian Prows, MD  hydrOXYzine (ATARAX/VISTARIL) 25 MG tablet Take 25 mg by mouth daily as needed for itching. 10/30/19   [provider]  loratadine (CLARITIN) 10 MG tablet Take 10 mg by mouth at bedtime.    [provider]  loratadine (CLARITIN) 10 MG tablet 1 tablet    [provider]  nitroGLYCERIN (NITROSTAT) 0.4 MG SL tablet Place 1 tablet (0.4 mg total) under the tongue every 5 (five) minutes as  needed for chest pain. 12/26/19   Adrian Prows, MD  oxybutynin (DITROPAN) 5 MG tablet Take 2.5 mg by mouth at bedtime.  05/02/18   [provider]  pantoprazole (PROTONIX) 20 MG tablet TAKE 1 TABLET BY MOUTH ONCE A DAY. 07/25/21   Cantwell, Celeste C, PA-C  REPATHA SURECLICK [id number] MG/ML SOAJ INJECT 140 MG INTO THE SKIN EVERY 2 WEEKS. 06/17/21   Adrian Prows, MD  temazepam (RESTORIL) 15 MG capsule Take 15 mg by mouth at bedtime as needed. 06/20/21   [provider]  triazolam (HALCION) 0.25 MG tablet Take 0.25 mg by mouth at bedtime.    [provider]  Vitamin D, Ergocalciferol, (DRISDOL) 1.25 MG (50000 UT) CAPS capsule Take 50,000 Units by mouth every Monday.    [provider]    Allergies    Adhesive [tape], Brilinta [ticagrelor], Statins, Carvedilol, Diovan [valsartan], Hydrochlorothiazide, Ibuprofen, Keflex [cephalexin], Morphine and related, Norvasc [amlodipine besylate], Nsaids, Oxycodone, Prednisone, Reglan [metoclopramide], and Singulair [montelukast]  Review of Systems   Review of Systems  Constitutional:  Negative for chills and fever.  HENT:  Negative for congestion.   Respiratory:  Negative for shortness of breath.   Cardiovascular:  Negative for chest pain.  Gastrointestinal:  Negative for abdominal pain, diarrhea, nausea  and vomiting.  Genitourinary:  Positive for dysuria, flank pain and hematuria.  Skin:  Negative for rash.  Neurological:  Negative for headaches.   Physical Exam Updated Vital Signs BP (!) 161/85 (BP Location: Left Arm)   Pulse 66   Temp (!) 97.5 F (36.4 C) (Oral)   Resp 17   Ht 5\' 5"  (1.651 m)   Wt 89.4 kg   SpO2 95%   BMI 32.78 kg/m   Physical Exam Vitals and nursing note reviewed.  Constitutional:      General: She is not in acute distress.    Appearance: Normal appearance. She is not diaphoretic.  HENT:     Head: Normocephalic.     Mouth/Throat:     Mouth: Mucous membranes are moist.  Cardiovascular:     Rate and Rhythm: Normal rate.  Pulmonary:     Effort: Pulmonary effort is normal. No respiratory distress.  Abdominal:     General: There is no distension.     Palpations: Abdomen is soft.     Tenderness: There is no abdominal tenderness. There is no guarding.  Skin:    General: Skin is warm.  Neurological:     Mental Status: She is alert and oriented to person, place, and time. Mental status is at baseline.  Psychiatric:        Mood and Affect: Mood normal.    ED Results / Procedures / Treatments   Labs (all labs ordered are listed, but only abnormal results are displayed) Labs Reviewed  URINALYSIS, ROUTINE W REFLEX MICROSCOPIC - Abnormal; Notable for the following components:      Result Value   Color, Urine STRAW (*)    Hgb urine dipstick MODERATE (*)    All other components within normal limits  CBC WITH DIFFERENTIAL/PLATELET  COMPREHENSIVE METABOLIC PANEL    EKG None  Radiology CT Renal Stone Study  Result Date: 07/27/2021 CLINICAL DATA:  Left flank pain for 2 weeks. Remote history of kidney stones. EXAM: CT ABDOMEN AND PELVIS WITHOUT CONTRAST TECHNIQUE: Multidetector CT imaging of the abdomen and pelvis was performed following the standard protocol without IV contrast. COMPARISON:  None. FINDINGS: Lower chest: No acute abnormality. Evaluation  of the abdominal viscera limited  by the lack of IV contrast. Additionally there is motion limiting evaluation of the upper abdomen. Hepatobiliary: There is a large cyst in the left hepatic lobe measuring 6.5 cm. There is a cyst in the right hepatic lobe measuring 5.2 cm. There are additional scattered smaller hypodensities which are too small to fully characterize but most likely represent cysts. Gallbladder is surgically absent. Pancreas: Unremarkable. No surrounding inflammatory changes. Spleen: Normal in size without focal abnormality. Adrenals/Urinary Tract: Adrenal glands are unremarkable. Kidneys are symmetric in size. No renal calculi or hydronephrosis identified. No large mass lesion. Urinary bladder is unremarkable. Stomach/Bowel: Stomach is within normal limits. No evidence of bowel wall thickening, distention, or inflammatory changes. Colonic diverticula without evidence of diverticulitis. Vascular/Lymphatic: Aortic atherosclerosis. No enlarged abdominal or pelvic lymph nodes. Reproductive: Status post hysterectomy. No adnexal masses. Other: No abdominal wall hernia or abnormality. No abdominopelvic ascites. Musculoskeletal: Multilevel degenerative disc disease in the lumbar spine. IMPRESSION: 1. No acute intra-abdominal or intrapelvic pathology on a noncontrast exam. No renal calculi or hydronephrosis. 2. Colonic diverticulosis without evidence of diverticulitis. 3. Aortic atherosclerosis. Aortic Atherosclerosis (ICD10-I70.0). Electronically Signed   By: Audie Pinto M.D.   On: 07/27/2021 13:51    Procedures Procedures   Medications Ordered in ED Medications  sodium chloride 0.9 % bolus 500 mL (0 mLs Intravenous Stopped 07/27/21 1455)  ketorolac (TORADOL) 30 MG/ML injection 15 mg (15 mg Intravenous Given 07/27/21 1404)    ED Course  I have reviewed the triage vital signs and the nursing notes.  Pertinent labs & imaging results that were available during my care of the patient were  reviewed by me and considered in my medical decision making (see chart for details).    MDM Rules/Calculators/A&P                           65 year old female presents emergency department with intermittent left flank pain, dysuria, hematuria and finding debris in her urine yesterday suspicious for passing a small kidney stone.  Vitals are stable on arrival, she is well-appearing, abdominal exam is benign.  Urinalysis has hematuria without signs of infection.  Blood work is reassuring, kidney function is normal.  CT renal study identifies no passing/obstructing stone.  Possible that the patient passed a kidney stone or this could be a component of her interstitial cystitis.  Patient feels improved after medication.  Plan to treat symptomatically with outpatient follow-up.  Patient at this time appears safe and stable for discharge and will be treated as an outpatient.  Discharge plan and strict return to ED precautions discussed, patient verbalizes understanding and agreement.  Final Clinical Impression(s) / ED Diagnoses Final diagnoses:  Hematuria, unspecified type    Rx / DC Orders ED Discharge Orders          Ordered    phenazopyridine (PYRIDIUM) 95 MG tablet  3 times daily PRN        07/27/21 1450             Alexsandria Kivett, Alvin Critchley, DO 07/27/21 1510

## 2021-07-27 NOTE — Discharge Instructions (Signed)
You have been seen and discharged from the emergency department.  Your blood work was normal, urinalysis showed blood.  CAT scan shows no obstructing or current passing stone.  I suspect you have passed a kidney stone.  Take medication for relief, stay well-hydrated.  Follow-up with your primary provider for reevaluation and further care. Take home medications as prescribed. If you have any worsening symptoms or further concerns for your health please return to an emergency department for further evaluation.

## 2021-07-27 NOTE — ED Triage Notes (Signed)
Left flank pain x 2 weeks, denies fever, hx of kidney stones 20 years ago, thinks she may have passed a kidney stone yesterday.

## 2021-07-28 ENCOUNTER — Ambulatory Visit: Payer: Self-pay

## 2021-08-08 DIAGNOSIS — M25561 Pain in right knee: Secondary | ICD-10-CM | POA: Diagnosis not present

## 2021-08-08 DIAGNOSIS — M1711 Unilateral primary osteoarthritis, right knee: Secondary | ICD-10-CM | POA: Diagnosis not present

## 2021-08-08 DIAGNOSIS — G8929 Other chronic pain: Secondary | ICD-10-CM | POA: Diagnosis not present

## 2021-08-08 DIAGNOSIS — M533 Sacrococcygeal disorders, not elsewhere classified: Secondary | ICD-10-CM | POA: Diagnosis not present

## 2021-08-12 ENCOUNTER — Other Ambulatory Visit: Payer: Self-pay | Admitting: Cardiology

## 2021-08-12 ENCOUNTER — Telehealth: Payer: Self-pay | Admitting: Adult Health

## 2021-08-12 DIAGNOSIS — H519 Unspecified disorder of binocular movement: Secondary | ICD-10-CM

## 2021-08-12 DIAGNOSIS — I639 Cerebral infarction, unspecified: Secondary | ICD-10-CM

## 2021-08-12 NOTE — Telephone Encounter (Signed)
Are you willing to write a letter for appeal? Please advise

## 2021-08-12 NOTE — Telephone Encounter (Signed)
Contact pt back, informed her of Jessicas message. She stated PCP is writing a letter but her employer would like one from [place] stating how she is doing and what she can and cant do from our perspective. Did reiterate since PCP is assisting with this she needs to contact them and usually employers will send [place] a form to feel out. She stated there is no form to fill out they, just want a letter regarding her condition.  She also mentioned going to the neuro-ophthalmologist that was previously discussed.

## 2021-08-12 NOTE — Telephone Encounter (Signed)
Pt called, need a letter to appeal case for disability at former employer. Asking letter state  not able to work due to condition since stroke and condition has not changed. Would like a call from the nurse.  Also have decided to see the neuro ophthalmologist discuss at last office visit.

## 2021-08-12 NOTE — Telephone Encounter (Signed)
She was on disability thru her PCP previously, now on social security disability. This letter should come from PCP who was assisting with disability initially. Thank you

## 2021-08-13 NOTE — Telephone Encounter (Signed)
Letter completed. I do not recall discussing a neuro-ophthalmologist during our visit (or at least I did not make mention of this in the note). Can you please have her remind me what this would be for? Thank you

## 2021-08-13 NOTE — Telephone Encounter (Addendum)
Contacted pt back, informed her letter was ready for pick up. Letter Given to debra, medical records, She also asked for last OV notes when picking up letter.   She stated Shanda Bumps mentioned  neuro-ophthalmologist due to her eyes not staying still due to stroke.

## 2021-08-14 NOTE — Addendum Note (Signed)
Addended by: Ihor Austin L on: 08/14/2021 05:07 PM   Modules accepted: Orders

## 2021-08-19 ENCOUNTER — Encounter: Payer: Self-pay | Admitting: *Deleted

## 2021-08-20 ENCOUNTER — Other Ambulatory Visit: Payer: Self-pay

## 2021-08-20 DIAGNOSIS — E7800 Pure hypercholesterolemia, unspecified: Secondary | ICD-10-CM

## 2021-08-20 MED ORDER — CLOPIDOGREL BISULFATE 75 MG PO TABS: ORAL_TABLET | ORAL | 1 refills | Status: DC

## 2021-08-20 MED ORDER — PANTOPRAZOLE SODIUM 20 MG PO TBEC: 20.0000 mg | DELAYED_RELEASE_TABLET | Freq: Every day | ORAL | 1 refills | Status: DC

## 2021-08-20 MED ORDER — EZETIMIBE 10 MG PO TABS: 10.0000 mg | ORAL_TABLET | Freq: Every day | ORAL | 1 refills | Status: DC

## 2021-08-20 MED ORDER — ACEBUTOLOL HCL 200 MG PO CAPS: 200.0000 mg | ORAL_CAPSULE | Freq: Two times a day (BID) | ORAL | 1 refills | Status: DC

## 2021-08-25 ENCOUNTER — Other Ambulatory Visit: Payer: Self-pay | Admitting: Student

## 2021-08-26 NOTE — Telephone Encounter (Signed)
Referral has been sent to Dr. Gentry Roch @ [facility]. Phone: [phone].

## 2021-08-27 ENCOUNTER — Telehealth: Payer: Self-pay | Admitting: Adult Health

## 2021-08-27 MED ORDER — DIVALPROEX SODIUM ER 250 MG PO TB24: 250.0000 mg | ORAL_TABLET | Freq: Every day | ORAL | 3 refills | Status: DC

## 2021-08-27 NOTE — Telephone Encounter (Signed)
Pt has called to inform Pincus Sanes, RN that she does not use the My Chart.  And yes, pt would very much like for the divalproex (DEPAKOTE ER) 250 MG 24 hr tablet to be sent thru  [facility]

## 2021-08-27 NOTE — Telephone Encounter (Signed)
Depakote Rx sent to [facility] per patient request.

## 2021-09-12 DIAGNOSIS — S46011A Strain of muscle(s) and tendon(s) of the rotator cuff of right shoulder, initial encounter: Secondary | ICD-10-CM | POA: Diagnosis not present

## 2021-09-15 ENCOUNTER — Other Ambulatory Visit: Payer: Self-pay | Admitting: Orthopedic Surgery

## 2021-09-15 DIAGNOSIS — G8929 Other chronic pain: Secondary | ICD-10-CM | POA: Diagnosis not present

## 2021-09-15 DIAGNOSIS — S46011A Strain of muscle(s) and tendon(s) of the rotator cuff of right shoulder, initial encounter: Secondary | ICD-10-CM

## 2021-09-15 DIAGNOSIS — M545 Low back pain, unspecified: Secondary | ICD-10-CM | POA: Diagnosis not present

## 2021-09-15 DIAGNOSIS — M533 Sacrococcygeal disorders, not elsewhere classified: Secondary | ICD-10-CM | POA: Diagnosis not present

## 2021-09-18 ENCOUNTER — Other Ambulatory Visit: Payer: Self-pay

## 2021-09-18 ENCOUNTER — Ambulatory Visit (HOSPITAL_COMMUNITY)
Admission: RE | Admit: 2021-09-18 | Discharge: 2021-09-18 | Disposition: A | Discharge status: Home / Self Care | Payer: Medicare Other | Source: Ambulatory Visit | Attending: Orthopedic Surgery | Admitting: Orthopedic Surgery

## 2021-09-18 DIAGNOSIS — M25511 Pain in right shoulder: Secondary | ICD-10-CM | POA: Diagnosis not present

## 2021-09-18 DIAGNOSIS — S46011A Strain of muscle(s) and tendon(s) of the rotator cuff of right shoulder, initial encounter: Secondary | ICD-10-CM

## 2021-09-26 ENCOUNTER — Other Ambulatory Visit: Payer: Self-pay | Admitting: Family Medicine

## 2021-09-26 DIAGNOSIS — M5136 Other intervertebral disc degeneration, lumbar region: Secondary | ICD-10-CM | POA: Diagnosis not present

## 2021-09-26 DIAGNOSIS — M5416 Radiculopathy, lumbar region: Secondary | ICD-10-CM

## 2021-09-26 DIAGNOSIS — M4807 Spinal stenosis, lumbosacral region: Secondary | ICD-10-CM | POA: Diagnosis not present

## 2021-09-29 ENCOUNTER — Ambulatory Visit: Payer: Commercial Managed Care - PPO | Admitting: Cardiology

## 2021-09-30 ENCOUNTER — Other Ambulatory Visit: Payer: Self-pay

## 2021-09-30 ENCOUNTER — Ambulatory Visit
Admission: RE | Admit: 2021-09-30 | Discharge: 2021-09-30 | Disposition: A | Discharge status: Home / Self Care | Payer: Commercial Managed Care - PPO | Source: Ambulatory Visit | Attending: Family Medicine | Admitting: Family Medicine

## 2021-09-30 DIAGNOSIS — M5416 Radiculopathy, lumbar region: Secondary | ICD-10-CM | POA: Diagnosis not present

## 2021-09-30 DIAGNOSIS — R200 Anesthesia of skin: Secondary | ICD-10-CM | POA: Diagnosis not present

## 2021-09-30 DIAGNOSIS — M4126 Other idiopathic scoliosis, lumbar region: Secondary | ICD-10-CM | POA: Diagnosis not present

## 2021-09-30 DIAGNOSIS — M545 Low back pain, unspecified: Secondary | ICD-10-CM | POA: Diagnosis not present

## 2021-09-30 DIAGNOSIS — M4807 Spinal stenosis, lumbosacral region: Secondary | ICD-10-CM | POA: Diagnosis not present

## 2021-10-03 DIAGNOSIS — M1711 Unilateral primary osteoarthritis, right knee: Secondary | ICD-10-CM | POA: Diagnosis not present

## 2021-10-03 DIAGNOSIS — M7050 Other bursitis of knee, unspecified knee: Secondary | ICD-10-CM | POA: Diagnosis not present

## 2021-10-07 DIAGNOSIS — M48062 Spinal stenosis, lumbar region with neurogenic claudication: Secondary | ICD-10-CM | POA: Diagnosis not present

## 2021-10-07 DIAGNOSIS — M5416 Radiculopathy, lumbar region: Secondary | ICD-10-CM | POA: Diagnosis not present

## 2021-10-28 DIAGNOSIS — M5136 Other intervertebral disc degeneration, lumbar region: Secondary | ICD-10-CM | POA: Diagnosis not present

## 2021-10-28 DIAGNOSIS — M4807 Spinal stenosis, lumbosacral region: Secondary | ICD-10-CM | POA: Diagnosis not present

## 2021-10-28 DIAGNOSIS — M48062 Spinal stenosis, lumbar region with neurogenic claudication: Secondary | ICD-10-CM | POA: Diagnosis not present

## 2021-10-28 DIAGNOSIS — M5416 Radiculopathy, lumbar region: Secondary | ICD-10-CM | POA: Diagnosis not present

## 2021-10-31 ENCOUNTER — Other Ambulatory Visit: Payer: Self-pay | Admitting: Student

## 2021-10-31 DIAGNOSIS — M48062 Spinal stenosis, lumbar region with neurogenic claudication: Secondary | ICD-10-CM | POA: Diagnosis not present

## 2021-10-31 DIAGNOSIS — M5416 Radiculopathy, lumbar region: Secondary | ICD-10-CM | POA: Diagnosis not present

## 2021-11-25 ENCOUNTER — Other Ambulatory Visit: Payer: Self-pay

## 2021-11-25 MED ORDER — REPATHA SURECLICK 140 MG/ML ~~LOC~~ SOAJ: SUBCUTANEOUS | 3 refills | Status: DC

## 2021-12-17 NOTE — Progress Notes (Signed)
? ?Primary Physician/Referring:  Georgianne Fick, MD ? ?Patient ID: Tonya Boyd, female    DOB: Aug 23, 1956, 66 y.o.   MRN: [id number] ? ?Chief Complaint  ?Patient presents with  ? Coronary Artery Disease  ? Hyperlipidemia  ? Follow-up  ?  6 month  ? ? ?HPI:   ? ?Tonya Boyd  is a 66 y.o. 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  Due to ongoing angina symptoms at last visit patient underwent repeat coronary angiography with successful stenting to proximal RCA and ostial PDA.  ? ?Patient presents for 6 month follow up. At last office visit patient had stopped Ranexa and Imdur and was doing well.  Patient continues to feel well overall.  Her primary concern is that over the last few weeks she has noticed an itchy rash on her arms, which she is wondering if it is related to Plavix.  Denies palpitations, syncope, near syncope, chest pain, dyspnea.  ? ?Has upcoming labs with PCP in 2 weeks.  His blood pressure is elevated in the office today, however on home readings she reports it is well controlled. ? ?Past Medical History:  ?Diagnosis Date  ? Angina pectoris (HCC)   ? Asthma   ? "as a child and as an adult" (08/02/2018)  ? CAD (coronary artery disease), native coronary artery 08/03/2018  ? Chronic bronchitis (HCC)   ? Chronic kidney disease   ? Coronary artery disease   ? Family history of adverse reaction to anesthesia   ? "cousin stopped breathing; he was allergic to the anesthesia" (08/02/2018)  ? GERD (gastroesophageal reflux disease)   ? High cholesterol   ? History of gout   ? History of hiatal hernia   ? History of kidney stones   ? Hypertension 07/2018  ? Interstitial cystitis   ? NSVT (nonsustained ventricular tachycardia)  (HCC) 07/2018  ? Raynaud's disease   ? "feet and hands" (08/02/2018)  ? Raynaud's disease without gangrene 10/15/2018  ? ?Past Surgical History:  ?Procedure Laterality Date  ? ABDOMINAL HYSTERECTOMY    ? ANTERIOR CERVICAL DECOMP/DISCECTOMY FUSION    ? APPENDECTOMY    ? AUGMENTATION MAMMAPLASTY Bilateral   ? BACK SURGERY    ? CORONARY ANGIOPLASTY WITH STENT PLACEMENT  08/02/2018  ? CORONARY STENT INTERVENTION N/A 08/02/2018  ? Procedure: CORONARY STENT INTERVENTION;  Surgeon: [name], MD;  Location: [facility];  Service: Cardiovascular;  Laterality: N/A;  RCA   ? CORONARY STENT INTERVENTION N/A 04/08/2021  ? Procedure: CORONARY STENT INTERVENTION;  Surgeon: [name], MD;  Location: [facility];  Service: Cardiovascular;  Laterality: N/A;  RCA and PDA  ? CYSTOSCOPY W/ STONE MANIPULATION    ? INTRAVASCULAR PRESSURE WIRE/FFR STUDY N/A 10/07/2018  ? Procedure: INTRAVASCULAR PRESSURE WIRE/FFR STUDY;  Surgeon: [name], MD;  Location: [facility];  Service: Cardiovascular;  Laterality: N/A;  ? KNEE ARTHROSCOPY Right   ? LAPAROSCOPIC CHOLECYSTECTOMY    ? LEFT HEART CATH AND CORONARY ANGIOGRAPHY N/A 10/07/2018  ? Procedure: LEFT HEART CATH AND CORONARY ANGIOGRAPHY;  Surgeon: [name], MD;  Location: [facility];  Service: Cardiovascular;  Laterality: N/A;  ? LEFT HEART CATH AND CORONARY ANGIOGRAPHY N/A 04/08/2021  ? Procedure: LEFT HEART CATH AND CORONARY  ANGIOGRAPHY;  Surgeon: Yates DecampGanji, Jay, MD;  [facility];  Service: Cardiovascular;  Laterality: N/A;  ? REPAIR ANKLE LIGAMENT Left   ? "tied up ligament; had tore up qthing in my ankle"  ? RIGHT HEART CATH  10/07/2018  ? RIGHT/LEFT HEART CATH AND CORONARY ANGIOGRAPHY N/A 08/02/2018  ? Procedure: RIGHT/LEFT HEART CATH AND CORONARY ANGIOGRAPHY;  Surgeon: Yates DecampGanji, Jay, MD;  Location: [facility];  Service: Cardiovascular;  Laterality: N/A;  ? SHOULDER SURGERY    ? bilateral  ? TONSILLECTOMY    ? TUBAL LIGATION    ? ?Family  History  ?Problem Relation Age of Onset  ? Kidney failure Mother   ? Diabetes Mother   ? Heart disease Father   ? Heart attack Father   ? Colon cancer Brother   ? Liver cancer Brother   ? Heart disease Other   ? Arthritis Other   ? Cancer Other   ? Asthma Other   ? Diabetes Other   ? Kidney disease Other   ? ?Social History  ? ?Tobacco Use  ? Smoking status: Never  ? Smokeless tobacco: Never  ?Substance Use Topics  ? Alcohol use: Not Currently  ?  Comment: 08/02/2018 "not since my 20's"  ? ?Marital Status: Married ? ?ROS  ?Review of Systems  ?Constitutional: Negative for malaise/fatigue.  ?Cardiovascular:  Negative for chest pain, claudication, dyspnea on exertion, leg swelling, near-syncope, orthopnea, palpitations, paroxysmal nocturnal dyspnea and syncope.  ?Gastrointestinal:  Negative for melena.  ?Neurological:  Negative for dizziness.  ?Psychiatric/Behavioral:  Positive for depression. The patient is nervous/anxious.   ?Objective  ?Blood pressure (!) 151/85, pulse 85, temperature 98 ?F (36.7 ?C), resp. rate 16, height 5\' 5"  (1.651 m), weight 205 lb (93 kg), SpO2 96 %.  ? ?  12/18/2021  ?  1:50 PM 07/27/2021  ?  2:55 PM 07/27/2021  ?  2:06 PM  ?Vitals with BMI  ?Height 5\' 5"     ?Weight 205 lbs    ?BMI 34.11    ?Systolic 151 [id number]  ?Diastolic 85 85 73  ?Pulse 85 66 65  ? ?Orthostatic VS for the past 72 hrs (Last 3 readings): ? Patient Position BP Location Cuff Size  ?12/18/21 1350 Sitting Left Arm Normal  ? ? ? Physical Exam ?Vitals reviewed.  ?Constitutional:   ?   Appearance: She is well-developed. She is obese.  ?HENT:  ?   Head: Normocephalic and atraumatic.  ?Cardiovascular:  ?   Rate and Rhythm: Normal rate and regular rhythm.  ?   Heart sounds: Normal heart sounds. No murmur heard. ?  No gallop.  ?Pulmonary:  ?   Effort: Pulmonary effort is normal. No respiratory distress.  ?Musculoskeletal:  ?   Right lower leg: No edema.  ?   Left lower leg: No edema.  ?Skin: ?   General: Skin is warm and dry.   ?Neurological:  ?   Mental Status: She is alert.  ?Physical exam is unchanged compared to previous office visit. ? ?Laboratory examination:  ? ? ?  Latest Ref Rng & Units 07/27/2021  ?  1:02 PM 04/03/2021  ? 11:17 AM 06/13/2019  ?  2:30 PM  ?CMP  ?Glucose 70 - 99 mg/dL 85   75   81    ?BUN 8 - 23 mg/dL 16   25   15     ?Creatinine 0.44 - 1.00 mg/dL 0.960.99   0.451.05   4.090.99    ?Sodium 135 - 145  mmol/L 136   140   142    ?Potassium 3.5 - 5.1 mmol/L 4.2   4.8   4.0    ?Chloride 98 - 111 mmol/L 101   101   104    ?CO2 22 - 32 mmol/L 29   24   26     ?Calcium 8.9 - 10.3 mg/dL 9.0   9.9   9.2    ?Total Protein 6.5 - 8.1 g/dL 7.4      ?Total Bilirubin 0.3 - 1.2 mg/dL 0.6      ?Alkaline Phos 38 - 126 U/L 87      ?AST 15 - 41 U/L 20      ?ALT 0 - 44 U/L 16      ? ? ?  Latest Ref Rng & Units 07/27/2021  ?  1:02 PM 04/03/2021  ? 11:17 AM 06/13/2019  ?  2:02 PM  ?CBC  ?WBC 4.0 - 10.5 K/uL 6.0   12.8     ?Hemoglobin 12.0 - 15.0 g/dL 08/13/2019   62.0   35.5    ?Hematocrit 36.0 - 46.0 % 42.6   41.3   46.0    ?Platelets 150 - 400 K/uL 248   265     ? ?Lipid Panel  ?   ?Component Value Date/Time  ? CHOL 141 01/05/2020 0816  ? TRIG 86 01/05/2020 0816  ? HDL 61 01/05/2020 0816  ? LDLCALC 64 01/05/2020 0816  ? ?HEMOGLOBIN A1C ?No results found for: HGBA1C, MPG ?TSH ?No results for input(s): TSH in the last 8760 hours. ? ?External labs:  ? ?Hemoglobin 14.700 G/ 12/18/2020 ?Platelets 265.000 X1 12/18/2020 ? ?Creatinine, Serum 1.020 MG/ 12/18/2020 ?Potassium 4.000 mm 06/13/2019 ?ALT (SGPT) 18.000 IU/ 12/18/2020 ? ?TSH 2.100 12/18/2020 ? ?Allergies  ? ?Allergies  ?Allergen Reactions  ? Adhesive [Tape] Other (See Comments)  ?  SKIN WILL TEAR EASILY!!!!  ? Brilinta [Ticagrelor] Shortness Of Breath  ? Statins Other (See Comments)  ?  myalgia  ? Carvedilol   ?  Unknown reaction   ? Diovan [Valsartan]   ?  Tingling   ? Hydrochlorothiazide   ?  Unknown reaction   ? Ibuprofen   ?  Causes bp to go up  ? Keflex [Cephalexin]   ?  confusion  ? Morphine And Related   ?   Hyper, ineffective   ? Norvasc [Amlodipine Besylate]   ?  Numbness and tingling   ? Nsaids   ?  Esophagus became red and swollen (tolerates meloxicam)   ? Oxycodone Itching and Other (See Comments)  ?  Welts, also (Tyl

## 2021-12-18 ENCOUNTER — Encounter: Payer: Self-pay | Admitting: Student

## 2021-12-18 ENCOUNTER — Ambulatory Visit: Payer: Medicare Other | Admitting: Student

## 2021-12-18 VITALS — BP 151/85 | HR 85 | Temp 98.0°F | Resp 16 | Ht 65.0 in | Wt 205.0 lb

## 2021-12-18 DIAGNOSIS — I25118 Atherosclerotic heart disease of native coronary artery with other forms of angina pectoris: Secondary | ICD-10-CM

## 2021-12-18 DIAGNOSIS — I951 Orthostatic hypotension: Secondary | ICD-10-CM

## 2021-12-18 DIAGNOSIS — E7800 Pure hypercholesterolemia, unspecified: Secondary | ICD-10-CM

## 2021-12-18 MED ORDER — REPATHA SURECLICK 140 MG/ML ~~LOC~~ SOAJ: SUBCUTANEOUS | 3 refills | Status: DC

## 2021-12-18 MED ORDER — CLOPIDOGREL BISULFATE 75 MG PO TABS: ORAL_TABLET | ORAL | 1 refills | Status: DC

## 2021-12-18 MED ORDER — EZETIMIBE 10 MG PO TABS: 10.0000 mg | ORAL_TABLET | Freq: Every day | ORAL | 3 refills | Status: DC

## 2021-12-18 MED ORDER — ACEBUTOLOL HCL 200 MG PO CAPS: ORAL_CAPSULE | ORAL | 3 refills | Status: DC

## 2021-12-30 ENCOUNTER — Other Ambulatory Visit: Payer: Self-pay | Admitting: Cardiology

## 2022-01-02 DIAGNOSIS — M7051 Other bursitis of knee, right knee: Secondary | ICD-10-CM | POA: Diagnosis not present

## 2022-01-02 DIAGNOSIS — M1711 Unilateral primary osteoarthritis, right knee: Secondary | ICD-10-CM | POA: Diagnosis not present

## 2022-01-09 DIAGNOSIS — N1831 Chronic kidney disease, stage 3a: Secondary | ICD-10-CM | POA: Diagnosis not present

## 2022-01-09 DIAGNOSIS — G8193 Hemiplegia, unspecified affecting right nondominant side: Secondary | ICD-10-CM | POA: Diagnosis not present

## 2022-01-09 DIAGNOSIS — R2990 Unspecified symptoms and signs involving the nervous system: Secondary | ICD-10-CM | POA: Diagnosis not present

## 2022-01-09 DIAGNOSIS — E782 Mixed hyperlipidemia: Secondary | ICD-10-CM | POA: Diagnosis not present

## 2022-01-16 DIAGNOSIS — Z0000 Encounter for general adult medical examination without abnormal findings: Secondary | ICD-10-CM | POA: Diagnosis not present

## 2022-01-16 DIAGNOSIS — R2990 Unspecified symptoms and signs involving the nervous system: Secondary | ICD-10-CM | POA: Diagnosis not present

## 2022-01-16 DIAGNOSIS — I10 Essential (primary) hypertension: Secondary | ICD-10-CM | POA: Diagnosis not present

## 2022-01-16 DIAGNOSIS — N1831 Chronic kidney disease, stage 3a: Secondary | ICD-10-CM | POA: Diagnosis not present

## 2022-01-16 DIAGNOSIS — E538 Deficiency of other specified B group vitamins: Secondary | ICD-10-CM | POA: Diagnosis not present

## 2022-01-16 DIAGNOSIS — I700 Atherosclerosis of aorta: Secondary | ICD-10-CM | POA: Diagnosis not present

## 2022-01-16 DIAGNOSIS — Z780 Asymptomatic menopausal state: Secondary | ICD-10-CM | POA: Diagnosis not present

## 2022-01-16 DIAGNOSIS — R202 Paresthesia of skin: Secondary | ICD-10-CM | POA: Diagnosis not present

## 2022-01-16 DIAGNOSIS — E782 Mixed hyperlipidemia: Secondary | ICD-10-CM | POA: Diagnosis not present

## 2022-01-16 DIAGNOSIS — J432 Centrilobular emphysema: Secondary | ICD-10-CM | POA: Diagnosis not present

## 2022-01-16 DIAGNOSIS — G8193 Hemiplegia, unspecified affecting right nondominant side: Secondary | ICD-10-CM | POA: Diagnosis not present

## 2022-01-16 DIAGNOSIS — I25118 Atherosclerotic heart disease of native coronary artery with other forms of angina pectoris: Secondary | ICD-10-CM | POA: Diagnosis not present

## 2022-01-29 ENCOUNTER — Other Ambulatory Visit: Payer: Self-pay | Admitting: Student

## 2022-02-02 ENCOUNTER — Other Ambulatory Visit: Payer: Self-pay | Admitting: Student

## 2022-02-03 ENCOUNTER — Telehealth: Payer: Self-pay

## 2022-02-03 ENCOUNTER — Telehealth: Payer: Self-pay | Admitting: Adult Health

## 2022-02-03 DIAGNOSIS — E7800 Pure hypercholesterolemia, unspecified: Secondary | ICD-10-CM

## 2022-02-03 MED ORDER — REPATHA SURECLICK 140 MG/ML ~~LOC~~ SOAJ: SUBCUTANEOUS | 3 refills | Status: DC

## 2022-02-03 MED ORDER — EZETIMIBE 10 MG PO TABS: 10.0000 mg | ORAL_TABLET | Freq: Every day | ORAL | 3 refills | Status: DC

## 2022-02-03 MED ORDER — DIVALPROEX SODIUM ER 250 MG PO TB24: 250.0000 mg | ORAL_TABLET | Freq: Every day | ORAL | 3 refills | Status: DC

## 2022-02-03 MED ORDER — PANTOPRAZOLE SODIUM 20 MG PO TBEC: 20.0000 mg | DELAYED_RELEASE_TABLET | Freq: Every day | ORAL | 3 refills | Status: DC

## 2022-02-03 MED ORDER — CLOPIDOGREL BISULFATE 75 MG PO TABS: ORAL_TABLET | ORAL | 1 refills | Status: DC

## 2022-02-03 NOTE — Telephone Encounter (Signed)
Depakote refill sent to [facility].

## 2022-02-03 NOTE — Telephone Encounter (Signed)
Error

## 2022-02-03 NOTE — Telephone Encounter (Signed)
Pt request 90-day supply refill fordivalproex (DEPAKOTE ER) 250 MG 24 hr tablet at [place] ([facility] )

## 2022-02-09 ENCOUNTER — Telehealth: Payer: Self-pay

## 2022-02-09 NOTE — Telephone Encounter (Signed)
Patient called stating that she need a new authorization for Repatha because it cannot be done under a PA, it has to be done under a medical doctor. Patient would like a call back once it's corrected. MM/S

## 2022-02-10 DIAGNOSIS — M1711 Unilateral primary osteoarthritis, right knee: Secondary | ICD-10-CM | POA: Diagnosis not present

## 2022-02-10 DIAGNOSIS — M7051 Other bursitis of knee, right knee: Secondary | ICD-10-CM | POA: Diagnosis not present

## 2022-02-10 MED ORDER — ACEBUTOLOL HCL 200 MG PO CAPS: ORAL_CAPSULE | ORAL | 3 refills | Status: DC

## 2022-02-10 NOTE — Telephone Encounter (Signed)
Pt called today and stated that her insurance will not cover her repatha if it is prescribed by a PA. Celeste agreed that you could start refilling pts repatha if you are okay with it. Please advise.

## 2022-02-10 NOTE — Telephone Encounter (Signed)
Patient called about a refill on Acebutolol.

## 2022-02-11 NOTE — Telephone Encounter (Signed)
Of course!

## 2022-02-12 ENCOUNTER — Other Ambulatory Visit: Payer: Self-pay

## 2022-02-12 MED ORDER — REPATHA SURECLICK 140 MG/ML ~~LOC~~ SOAJ: SUBCUTANEOUS | 3 refills | Status: DC

## 2022-02-12 MED ORDER — CLOPIDOGREL BISULFATE 75 MG PO TABS: 75.0000 mg | ORAL_TABLET | Freq: Every day | ORAL | 1 refills | Status: DC

## 2022-02-12 NOTE — Telephone Encounter (Signed)
Refill has been sent to pts pharmacy.

## 2022-02-23 ENCOUNTER — Other Ambulatory Visit: Payer: Self-pay

## 2022-02-23 MED ORDER — REPATHA SURECLICK 140 MG/ML ~~LOC~~ SOAJ: SUBCUTANEOUS | 3 refills | Status: DC

## 2022-03-19 DIAGNOSIS — M7051 Other bursitis of knee, right knee: Secondary | ICD-10-CM | POA: Diagnosis not present

## 2022-03-19 DIAGNOSIS — M1711 Unilateral primary osteoarthritis, right knee: Secondary | ICD-10-CM | POA: Diagnosis not present

## 2022-03-19 DIAGNOSIS — S86811A Strain of other muscle(s) and tendon(s) at lower leg level, right leg, initial encounter: Secondary | ICD-10-CM | POA: Diagnosis not present

## 2022-04-02 ENCOUNTER — Other Ambulatory Visit: Payer: Self-pay

## 2022-04-02 ENCOUNTER — Encounter (HOSPITAL_COMMUNITY): Payer: Self-pay

## 2022-04-02 ENCOUNTER — Emergency Department (HOSPITAL_COMMUNITY): Payer: Medicare Other

## 2022-04-02 ENCOUNTER — Inpatient Hospital Stay (HOSPITAL_COMMUNITY)
Admission: EM | Admit: 2022-04-02 | Discharge: 2022-04-05 | DRG: 202 | Disposition: A | Discharge status: Home / Self Care | Payer: Medicare Other | Attending: Internal Medicine | Admitting: Internal Medicine

## 2022-04-02 DIAGNOSIS — Z8249 Family history of ischemic heart disease and other diseases of the circulatory system: Secondary | ICD-10-CM

## 2022-04-02 DIAGNOSIS — R0602 Shortness of breath: Secondary | ICD-10-CM | POA: Diagnosis not present

## 2022-04-02 DIAGNOSIS — I2510 Atherosclerotic heart disease of native coronary artery without angina pectoris: Secondary | ICD-10-CM | POA: Diagnosis not present

## 2022-04-02 DIAGNOSIS — G43909 Migraine, unspecified, not intractable, without status migrainosus: Secondary | ICD-10-CM | POA: Diagnosis present

## 2022-04-02 DIAGNOSIS — R059 Cough, unspecified: Secondary | ICD-10-CM | POA: Diagnosis not present

## 2022-04-02 DIAGNOSIS — J45901 Unspecified asthma with (acute) exacerbation: Secondary | ICD-10-CM

## 2022-04-02 DIAGNOSIS — J9601 Acute respiratory failure with hypoxia: Secondary | ICD-10-CM

## 2022-04-02 DIAGNOSIS — G43109 Migraine with aura, not intractable, without status migrainosus: Secondary | ICD-10-CM

## 2022-04-02 DIAGNOSIS — Z79899 Other long term (current) drug therapy: Secondary | ICD-10-CM

## 2022-04-02 DIAGNOSIS — J4552 Severe persistent asthma with status asthmaticus: Principal | ICD-10-CM | POA: Diagnosis present

## 2022-04-02 DIAGNOSIS — Z91048 Other nonmedicinal substance allergy status: Secondary | ICD-10-CM

## 2022-04-02 DIAGNOSIS — Z7982 Long term (current) use of aspirin: Secondary | ICD-10-CM | POA: Diagnosis not present

## 2022-04-02 DIAGNOSIS — Z981 Arthrodesis status: Secondary | ICD-10-CM

## 2022-04-02 DIAGNOSIS — B9789 Other viral agents as the cause of diseases classified elsewhere: Secondary | ICD-10-CM | POA: Diagnosis present

## 2022-04-02 DIAGNOSIS — E7800 Pure hypercholesterolemia, unspecified: Secondary | ICD-10-CM | POA: Diagnosis present

## 2022-04-02 DIAGNOSIS — K219 Gastro-esophageal reflux disease without esophagitis: Secondary | ICD-10-CM | POA: Diagnosis not present

## 2022-04-02 DIAGNOSIS — Z886 Allergy status to analgesic agent status: Secondary | ICD-10-CM

## 2022-04-02 DIAGNOSIS — M109 Gout, unspecified: Secondary | ICD-10-CM | POA: Diagnosis present

## 2022-04-02 DIAGNOSIS — I10 Essential (primary) hypertension: Secondary | ICD-10-CM | POA: Diagnosis not present

## 2022-04-02 DIAGNOSIS — Z888 Allergy status to other drugs, medicaments and biological substances status: Secondary | ICD-10-CM

## 2022-04-02 DIAGNOSIS — Z7902 Long term (current) use of antithrombotics/antiplatelets: Secondary | ICD-10-CM

## 2022-04-02 DIAGNOSIS — Z885 Allergy status to narcotic agent status: Secondary | ICD-10-CM | POA: Diagnosis not present

## 2022-04-02 DIAGNOSIS — Z955 Presence of coronary angioplasty implant and graft: Secondary | ICD-10-CM

## 2022-04-02 DIAGNOSIS — I7300 Raynaud's syndrome without gangrene: Secondary | ICD-10-CM | POA: Diagnosis present

## 2022-04-02 DIAGNOSIS — Z825 Family history of asthma and other chronic lower respiratory diseases: Secondary | ICD-10-CM | POA: Diagnosis not present

## 2022-04-02 DIAGNOSIS — Z20822 Contact with and (suspected) exposure to covid-19: Secondary | ICD-10-CM | POA: Diagnosis present

## 2022-04-02 DIAGNOSIS — E785 Hyperlipidemia, unspecified: Secondary | ICD-10-CM

## 2022-04-02 LAB — CBC WITH DIFFERENTIAL/PLATELET
Abs Immature Granulocytes: 0.05 10*3/uL (ref 0.00–0.07)
Basophils Absolute: 0.1 10*3/uL (ref 0.0–0.1)
Basophils Relative: 0 %
Eosinophils Absolute: 0.2 10*3/uL (ref 0.0–0.5)
Eosinophils Relative: 2 %
HCT: 42.1 % (ref 36.0–46.0)
Hemoglobin: 14 g/dL (ref 12.0–15.0)
Immature Granulocytes: 0 %
Lymphocytes Relative: 9 %
Lymphs Abs: 1.2 10*3/uL (ref 0.7–4.0)
MCH: 31.8 pg (ref 26.0–34.0)
MCHC: 33.3 g/dL (ref 30.0–36.0)
MCV: 95.7 fL (ref 80.0–100.0)
Monocytes Absolute: 1 10*3/uL (ref 0.1–1.0)
Monocytes Relative: 8 %
Neutro Abs: 11.3 10*3/uL — ABNORMAL HIGH (ref 1.7–7.7)
Neutrophils Relative %: 81 %
Platelets: 274 10*3/uL (ref 150–400)
RBC: 4.4 MIL/uL (ref 3.87–5.11)
RDW: 12.6 % (ref 11.5–15.5)
WBC: 13.9 10*3/uL — ABNORMAL HIGH (ref 4.0–10.5)
nRBC: 0 % (ref 0.0–0.2)

## 2022-04-02 LAB — BASIC METABOLIC PANEL
Anion gap: 9 (ref 5–15)
BUN: 13 mg/dL (ref 8–23)
CO2: 25 mmol/L (ref 22–32)
Calcium: 9 mg/dL (ref 8.9–10.3)
Chloride: 102 mmol/L (ref 98–111)
Creatinine, Ser: 1.06 mg/dL — ABNORMAL HIGH (ref 0.44–1.00)
GFR, Estimated: 58 mL/min — ABNORMAL LOW (ref >60)
Glucose, Bld: 135 mg/dL — ABNORMAL HIGH (ref 70–99)
Potassium: 3.9 mmol/L (ref 3.5–5.1)
Sodium: 136 mmol/L (ref 135–145)

## 2022-04-02 LAB — BLOOD GAS, VENOUS
Acid-Base Excess: 2.5 mmol/L — ABNORMAL HIGH (ref 0.0–2.0)
Bicarbonate: 26.3 mmol/L (ref 20.0–28.0)
Drawn by: 61882
O2 Saturation: 96.5 %
Patient temperature: 36.9
pCO2, Ven: 37 mmHg — ABNORMAL LOW (ref 44–60)
pH, Ven: 7.46 — ABNORMAL HIGH (ref 7.25–7.43)
pO2, Ven: 73 mmHg — ABNORMAL HIGH (ref 32–45)

## 2022-04-02 LAB — RESPIRATORY PANEL BY PCR

## 2022-04-02 LAB — RESP PANEL BY RT-PCR (FLU A&B, COVID) ARPGX2
Influenza A by PCR: NEGATIVE
Influenza B by PCR: NEGATIVE
SARS Coronavirus 2 by RT PCR: NEGATIVE

## 2022-04-02 LAB — HIV ANTIBODY (ROUTINE TESTING W REFLEX): HIV Screen 4th Generation wRfx: NONREACTIVE

## 2022-04-02 LAB — BRAIN NATRIURETIC PEPTIDE: B Natriuretic Peptide: 71 pg/mL (ref 0.0–100.0)

## 2022-04-02 LAB — TROPONIN I (HIGH SENSITIVITY): Troponin I (High Sensitivity): 2 ng/L (ref <18)

## 2022-04-02 LAB — PROCALCITONIN: Procalcitonin: <0.1 ng/mL

## 2022-04-02 MED ORDER — ONDANSETRON HCL 4 MG PO TABS: 4.0000 mg | ORAL_TABLET | Freq: Four times a day (QID) | ORAL | Status: DC | PRN

## 2022-04-02 MED ORDER — CLOPIDOGREL BISULFATE 75 MG PO TABS
75.0000 mg | ORAL_TABLET | Freq: Every day | ORAL | Status: DC
  Administered 2022-04-02 – 2022-04-05 (×4): 75 mg via ORAL
  Filled 2022-04-02 (×4): qty 1

## 2022-04-02 MED ORDER — CYCLOBENZAPRINE HCL 10 MG PO TABS
10.0000 mg | ORAL_TABLET | Freq: Every day | ORAL | Status: DC
  Administered 2022-04-02 – 2022-04-04 (×3): 10 mg via ORAL
  Filled 2022-04-02 (×3): qty 1

## 2022-04-02 MED ORDER — ALBUTEROL SULFATE (2.5 MG/3ML) 0.083% IN NEBU
10.0000 mg/h | INHALATION_SOLUTION | Freq: Once | RESPIRATORY_TRACT | Status: AC
  Administered 2022-04-02: 10 mg/h via RESPIRATORY_TRACT
  Filled 2022-04-02: qty 12

## 2022-04-02 MED ORDER — ENOXAPARIN SODIUM 40 MG/0.4ML IJ SOSY
40.0000 mg | PREFILLED_SYRINGE | INTRAMUSCULAR | Status: DC
Start: 2022-04-02 — End: 2022-04-05
  Administered 2022-04-02 – 2022-04-05 (×4): 40 mg via SUBCUTANEOUS
  Filled 2022-04-02 (×4): qty 0.4

## 2022-04-02 MED ORDER — METHYLPREDNISOLONE SODIUM SUCC 125 MG IJ SOLR
125.0000 mg | Freq: Once | INTRAMUSCULAR | Status: AC
  Administered 2022-04-02: 125 mg via INTRAVENOUS
  Filled 2022-04-02: qty 2

## 2022-04-02 MED ORDER — ACEBUTOLOL HCL 200 MG PO CAPS
200.0000 mg | ORAL_CAPSULE | Freq: Two times a day (BID) | ORAL | Status: DC
  Administered 2022-04-02 – 2022-04-05 (×7): 200 mg via ORAL
  Filled 2022-04-02 (×9): qty 1

## 2022-04-02 MED ORDER — IPRATROPIUM BROMIDE 0.02 % IN SOLN
0.5000 mg | Freq: Once | RESPIRATORY_TRACT | Status: AC
  Administered 2022-04-02: 0.5 mg via RESPIRATORY_TRACT
  Filled 2022-04-02: qty 2.5

## 2022-04-02 MED ORDER — ACETAMINOPHEN 325 MG PO TABS
650.0000 mg | ORAL_TABLET | Freq: Four times a day (QID) | ORAL | Status: DC | PRN
  Administered 2022-04-02 – 2022-04-04 (×2): 650 mg via ORAL
  Filled 2022-04-02 (×2): qty 2

## 2022-04-02 MED ORDER — PANTOPRAZOLE SODIUM 40 MG PO TBEC
40.0000 mg | DELAYED_RELEASE_TABLET | Freq: Every day | ORAL | Status: DC
  Administered 2022-04-02 – 2022-04-05 (×4): 40 mg via ORAL
  Filled 2022-04-02 (×4): qty 1

## 2022-04-02 MED ORDER — IPRATROPIUM-ALBUTEROL 0.5-2.5 (3) MG/3ML IN SOLN
3.0000 mL | Freq: Four times a day (QID) | RESPIRATORY_TRACT | Status: DC
  Administered 2022-04-02 (×2): 3 mL via RESPIRATORY_TRACT
  Filled 2022-04-02 (×2): qty 3

## 2022-04-02 MED ORDER — HYDROXYZINE HCL 25 MG PO TABS: 25.0000 mg | ORAL_TABLET | Freq: Every day | ORAL | Status: DC | PRN

## 2022-04-02 MED ORDER — ACETAMINOPHEN 650 MG RE SUPP: 650.0000 mg | Freq: Four times a day (QID) | RECTAL | Status: DC | PRN

## 2022-04-02 MED ORDER — ALBUTEROL SULFATE (2.5 MG/3ML) 0.083% IN NEBU
5.0000 mg | INHALATION_SOLUTION | Freq: Once | RESPIRATORY_TRACT | Status: AC
  Administered 2022-04-02: 5 mg via RESPIRATORY_TRACT
  Filled 2022-04-02: qty 6

## 2022-04-02 MED ORDER — ONDANSETRON HCL 4 MG/2ML IJ SOLN: 4.0000 mg | Freq: Four times a day (QID) | INTRAMUSCULAR | Status: DC | PRN

## 2022-04-02 MED ORDER — LORATADINE 10 MG PO TABS
10.0000 mg | ORAL_TABLET | Freq: Every day | ORAL | Status: DC
  Administered 2022-04-02 – 2022-04-04 (×3): 10 mg via ORAL
  Filled 2022-04-02 (×3): qty 1

## 2022-04-02 MED ORDER — ALBUTEROL SULFATE HFA 108 (90 BASE) MCG/ACT IN AERS: 2.0000 | INHALATION_SPRAY | RESPIRATORY_TRACT | Status: DC | PRN

## 2022-04-02 MED ORDER — BUDESONIDE 0.5 MG/2ML IN SUSP
0.5000 mg | Freq: Two times a day (BID) | RESPIRATORY_TRACT | Status: DC
  Administered 2022-04-02 – 2022-04-05 (×6): 0.5 mg via RESPIRATORY_TRACT
  Filled 2022-04-02 (×6): qty 2

## 2022-04-02 MED ORDER — METHYLPREDNISOLONE SODIUM SUCC 125 MG IJ SOLR
60.0000 mg | Freq: Two times a day (BID) | INTRAMUSCULAR | Status: DC
  Administered 2022-04-02 – 2022-04-05 (×7): 60 mg via INTRAVENOUS
  Filled 2022-04-02 (×7): qty 2

## 2022-04-02 MED ORDER — DIVALPROEX SODIUM ER 250 MG PO TB24
250.0000 mg | ORAL_TABLET | Freq: Every day | ORAL | Status: DC
  Administered 2022-04-02 – 2022-04-05 (×4): 250 mg via ORAL
  Filled 2022-04-02 (×5): qty 1

## 2022-04-02 MED ORDER — TRAZODONE HCL 50 MG PO TABS
100.0000 mg | ORAL_TABLET | Freq: Every evening | ORAL | Status: DC | PRN
  Administered 2022-04-02 – 2022-04-04 (×3): 100 mg via ORAL
  Filled 2022-04-02 (×3): qty 2

## 2022-04-02 MED ORDER — EZETIMIBE 10 MG PO TABS
10.0000 mg | ORAL_TABLET | Freq: Every day | ORAL | Status: DC
  Administered 2022-04-02 – 2022-04-05 (×4): 10 mg via ORAL
  Filled 2022-04-02 (×4): qty 1

## 2022-04-02 MED ORDER — OXYBUTYNIN CHLORIDE 5 MG PO TABS
2.5000 mg | ORAL_TABLET | Freq: Every day | ORAL | Status: DC
  Filled 2022-04-02 (×2): qty 1

## 2022-04-02 NOTE — Assessment & Plan Note (Signed)
Continue acebutolol ?

## 2022-04-02 NOTE — Assessment & Plan Note (Signed)
Continue Zetia. °

## 2022-04-02 NOTE — ED Provider Notes (Signed)
[facility] EMERGENCY DEPARTMENT Provider Note   CSN: [id number] Arrival date & time: 04/02/22  0354     History  Chief Complaint  Patient presents with   Cough   Shortness of Breath    Tonya Boyd is a 66 y.o. female.  The history is provided by the patient and the spouse.  Cough Cough characteristics:  Productive Sputum characteristics:  Green Onset quality:  Gradual Duration:  2 days Timing:  Intermittent Progression:  Worsening Chronicity:  New Smoker: no   Worsened by:  Activity Associated symptoms: chills and shortness of breath   Associated symptoms: no fever   Shortness of Breath Associated symptoms: cough   Associated symptoms: no fever   Patient with history of CAD, asthma presents with shortness of breath and cough.  She reports over the past 2 days she had increasing cough, congestion and having wheezing.  She reports green productive sputum.  No hemoptysis.  She is a non-smoker.  She has used albuterol at home without relief.  She is not on antibiotics.  She is not on home oxygen    Past Medical History:  Diagnosis Date   Angina pectoris (HCC)    Asthma    "as a child and as an adult" (08/02/2018)   CAD (coronary artery disease), native coronary artery 08/03/2018   Chronic bronchitis (HCC)    Chronic kidney disease    Coronary artery disease    Family history of adverse reaction to anesthesia    "cousin stopped breathing; he was allergic to the anesthesia" (08/02/2018)   GERD (gastroesophageal reflux disease)    High cholesterol    History of gout    History of hiatal hernia    History of kidney stones    Hypertension 07/2018   Interstitial cystitis    NSVT (nonsustained ventricular tachycardia) (HCC) 07/2018   Raynaud's disease    "feet and hands" (08/02/2018)   Raynaud's disease without gangrene 10/15/2018    Home Medications Prior to Admission medications   Medication Sig Start Date End Date Taking? Authorizing Provider  acebutolol  (SECTRAL) 200 MG capsule TAKE 1 CAPSULE TWICE A DAY 02/10/22   Cantwell, Celeste C, PA-C  acetaminophen (TYLENOL) 500 MG tablet Take 1,000 mg by mouth every 8 (eight) hours as needed for moderate pain or headache.    [provider]  albuterol (VENTOLIN HFA) 108 (90 Base) MCG/ACT inhaler Inhale 1 puff into the lungs every 4 (four) hours as needed for wheezing or shortness of breath.    [provider]  aspirin EC 81 MG tablet Take 81 mg by mouth daily.    [provider]  clopidogrel (PLAVIX) 75 MG tablet Take 1 tablet (75 mg total) by mouth daily. 02/12/22   Cantwell, Celeste C, PA-C  cyanocobalamin (,VITAMIN B-12,) 1000 MCG/ML injection Inject 1,000 mcg into the skin every 30 (thirty) days. 01/31/21   [provider]  cyclobenzaprine (FLEXERIL) 10 MG tablet Take 10 mg by mouth at bedtime.    [provider]  divalproex (DEPAKOTE ER) 250 MG 24 hr tablet Take 1 tablet (250 mg total) by mouth daily. 02/03/22   Ihor Austin, NP  Evolocumab (REPATHA SURECLICK) 140 MG/ML SOAJ PA approved 10/26/2021 through 11/05/2022. 02/23/22   [name], MD  ezetimibe (ZETIA) 10 MG tablet Take 1 tablet (10 mg total) by mouth daily. 02/03/22   Cantwell, Celeste C, PA-C  hydrOXYzine (ATARAX/VISTARIL) 25 MG tablet Take 25 mg by mouth daily as needed for itching. 10/30/19  [provider]  loratadine (CLARITIN) 10 MG tablet Take 10 mg by mouth at bedtime.    [provider]  loratadine (CLARITIN) 10 MG tablet 1 tablet    [provider]  nitroGLYCERIN (NITROSTAT) 0.4 MG SL tablet DISSOLVE 1 TABLET SUBLINGUALLY AS NEEDED FOR CHEST PAIN, MAY REPEAT EVERY5 MINUTES. AFTER 3 CALL 911. 12/30/21   [name], MD  oxybutynin (DITROPAN) 5 MG tablet Take 2.5 mg by mouth at bedtime.  05/02/18   [provider]  pantoprazole (PROTONIX) 20 MG tablet Take 1 tablet (20 mg total) by mouth daily. 02/03/22   Cantwell, Celeste C, PA-C  temazepam (RESTORIL) 15 MG capsule  Take 15 mg by mouth at bedtime as needed. 06/20/21   [provider]  traZODone (DESYREL) 50 MG tablet 1-2 tablet at bedtime as needed 07/15/21   [provider]  triazolam (HALCION) 0.25 MG tablet Take 0.25 mg by mouth at bedtime.    [provider]  Vitamin D, Ergocalciferol, (DRISDOL) 1.25 MG (50000 UT) CAPS capsule Take 50,000 Units by mouth every Monday.    [provider]      Allergies    Adhesive [tape], Brilinta [ticagrelor], Statins, Carvedilol, Diovan [valsartan], Hydrochlorothiazide, Ibuprofen, Keflex [cephalexin], Morphine and related, Norvasc [amlodipine besylate], Nsaids, Oxycodone, Prednisone, Reglan [metoclopramide], and Singulair [montelukast]    Review of Systems   Review of Systems  Constitutional:  Positive for chills. Negative for fever.  Respiratory:  Positive for cough and shortness of breath.     Physical Exam Updated Vital Signs BP 138/78   Pulse (!) 112   Temp 97.7 F (36.5 C) (Oral)   Resp 16   Ht 1.651 m (5\' 5" )   Wt 90.7 kg   SpO2 98%   BMI 33.28 kg/m  Physical Exam CONSTITUTIONAL: Elderly, ill-appearing HEAD: Normocephalic/atraumatic EYES: EOMI/PERRL ENMT: Mucous membranes moist NECK: supple no meningeal signs SPINE/BACK:entire spine nontender CV: S1/S2 noted, no murmurs/rubs/gallops noted LUNGS: Tachypneic and wheezing bilaterally ABDOMEN: soft, nontender NEURO: Pt is awake/alert/appropriate, moves all extremitiesx4.  No facial droop.   EXTREMITIES: pulses normal/equal, full ROM, no significant lower extremity edema SKIN: warm, color normal PSYCH: Mildly anxious  ED Results / Procedures / Treatments   Labs (all labs ordered are listed, but only abnormal results are displayed) Labs Reviewed  CBC WITH DIFFERENTIAL/PLATELET - Abnormal; Notable for the following components:      Result Value   WBC 13.9 (*)    Neutro Abs 11.3 (*)    All other components within normal limits  BASIC METABOLIC PANEL -  Abnormal; Notable for the following components:   Glucose, Bld 135 (*)    Creatinine, Ser 1.06 (*)    GFR, Estimated 58 (*)    All other components within normal limits  RESP PANEL BY RT-PCR (FLU A&B, COVID) ARPGX2  BRAIN NATRIURETIC PEPTIDE  TROPONIN I (HIGH SENSITIVITY)    EKG EKG Interpretation  Date/Time:  Thursday April 02 2022 04:17:07 EDT Ventricular Rate:  98 PR Interval:  130 QRS Duration: 96 QT Interval:  369 QTC Calculation: 472 R Axis:   -33 Text Interpretation: Sinus rhythm Left axis deviation Low voltage, precordial leads Consider anterior infarct Minimal ST depression, anterolateral leads Interpretation limited secondary to artifact Confirmed by 04-07-1998 (Zadie Rhine) on 04/02/2022 4:27:05 AM  Radiology DG Chest Portable 1 View  Result Date: 04/02/2022 CLINICAL DATA:  66 year old female with cough and shortness of breath for 2 days. EXAM: PORTABLE CHEST 1 VIEW COMPARISON:  Chest radiographs 01/09/2017 and earlier.  FINDINGS: Portable AP upright view at 0423 hours. The patient is mildly rotated to the left. Chronic cervical ACDF. Mildly lower lung volumes. Mediastinal contours remain normal. Visualized tracheal air column is within normal limits. Allowing for portable technique the lungs are clear. No pneumothorax or pleural effusion. Negative visible bowel gas. No acute osseous abnormality identified. IMPRESSION: No acute cardiopulmonary abnormality. Electronically Signed   By: Odessa Fleming M.D.   On: 04/02/2022 04:49    Procedures .Critical Care  Performed by: Zadie Rhine, MD Authorized by: Zadie Rhine, MD   Critical care provider statement:    Critical care time (minutes):  75   Critical care start time:  04/02/2022 5:50 AM   Critical care end time:  04/02/2022 7:05 AM   Critical care time was exclusive of:  Separately billable procedures and treating other patients   Critical care was necessary to treat or prevent imminent or life-threatening deterioration of  the following conditions:  Respiratory failure and cardiac failure   Critical care was time spent personally by me on the following activities:  Examination of patient, development of treatment plan with patient or surrogate, re-evaluation of patient's condition, pulse oximetry, ordering and review of radiographic studies, ordering and review of laboratory studies, ordering and performing treatments and interventions, review of old charts, evaluation of patient's response to treatment and obtaining history from patient or surrogate   I assumed direction of critical care for this patient from another provider in my specialty: no     Care discussed with: admitting provider       Medications Ordered in ED Medications  albuterol (PROVENTIL) (2.5 MG/3ML) 0.083% nebulizer solution 5 mg (5 mg Nebulization Given 04/02/22 0459)  ipratropium (ATROVENT) nebulizer solution 0.5 mg (0.5 mg Nebulization Given 04/02/22 0459)  methylPREDNISolone sodium succinate (SOLU-MEDROL) 125 mg/2 mL injection 125 mg (125 mg Intravenous Given 04/02/22 0456)  albuterol (PROVENTIL) (2.5 MG/3ML) 0.083% nebulizer solution (10 mg/hr Nebulization Given 04/02/22 0557)    ED Course/ Medical Decision Making/ A&P Clinical Course as of 04/02/22 0712  Thu Apr 02, 2022  0549 Glucose(!): 135 Mild hyperglycemia [DW]  0549 WBC(!): 13.9 Leukocytosis [DW]  0549 Patient still with wheezing and shortness of breath, will give hour-long treatment [DW]  [phone] Patient is very short of breath with any movement and she still has wheezing.  This is despite multiple nebulized therapies.  Patient be admitted to the hospital [DW]  0711 D/w Dr TAT for admission [DW]    Clinical Course User Index [DW] Zadie Rhine, MD                           Medical Decision Making Amount and/or Complexity of Data Reviewed Labs: ordered. Decision-making details documented in ED Course. Radiology: ordered.  Risk Prescription drug management. Decision  regarding hospitalization.   This patient presents to the ED for concern of shortness of breath, this involves an extensive number of treatment options, and is a complaint that carries with it a high risk of complications and morbidity.  The differential diagnosis includes but is not limited to Acute coronary syndrome, pneumonia, acute pulmonary edema, pneumothorax, acute anemia, pulmonary embolism, Asthma exacerbation  Comorbidities that complicate the patient evaluation: Patient's presentation is complicated by their history of CAD and asthma  Additional history obtained: Additional history obtained from spouse Records reviewed  cardiology records reviewed  Lab Tests: I Ordered, and personally interpreted labs.  The pertinent results include: Leukocytosis  Imaging Studies ordered: I  ordered imaging studies including X-ray chest   I independently visualized and interpreted imaging which showed no acute findings I agree with the radiologist interpretation  Cardiac Monitoring: The patient was maintained on a cardiac monitor.  I personally viewed and interpreted the cardiac monitor which showed an underlying rhythm of:  sinus rhythm  Medicines ordered and prescription drug management: I ordered medication including albuterol and Atrovent for wheezing Reevaluation of the patient after these medicines showed that the patient    stayed the same   Critical Interventions:  Nebulized therapy  Consultations Obtained: I requested consultation with the admitting physician [facility] , and discussed  findings as well as pertinent plan - they recommend: Admit  Reevaluation: After the interventions noted above, I reevaluated the patient and found that they have :stayed the same  Complexity of problems addressed: Patient's presentation is most consistent with  acute presentation with potential threat to life or bodily function  Disposition: After consideration of the diagnostic results and the  patient's response to treatment,  I feel that the patent would benefit from admission   .    Patient previous history of asthma coming in with cough wheezing shortness of breath.  She has not improved with multiple therapies, she will need to be admitted.       Final Clinical Impression(s) / ED Diagnoses Final diagnoses:  Severe persistent asthma with status asthmaticus    Rx / DC Orders ED Discharge Orders     None         Zadie Rhine, MD 04/02/22 [phone]

## 2022-04-02 NOTE — H&P (Addendum)
History and Physical    Patient: Tonya Boyd:[id number] DOB: Feb 23, 1956 DOA: 04/02/2022 DOS: the patient was seen and examined on 04/02/2022 PCP: Georgianne Fick, MD  Patient coming from: Home  Chief Complaint:  Chief Complaint  Patient presents with   Cough   Shortness of Breath   HPI: Tonya Boyd is a 66 year old female with a history of coronary artery disease, asthma, hypertension, GERD, hyperlipidemia, conversion disorder, migraine headaches presenting with coughing, chest congestion, shortness of breath for 2 days.  She states that she initially began coughing just clear type material but in the past 24 hours she has noticed some green sputum.  She denies any hemoptysis.  She has had some subjective fevers and chills.  She states that on average day she does not use her inhalers nor her nebulizer machine; however, she has been using her nebulizers for the past 2 days without much improvement.  She denies any recent illnesses or smoke exposure.  However she states that she has been cleaning her house exposing herself to lots of dust to which she states that she is allergic.  She states that she has never smoked.  She denies any headache, visual disturbance, neck pain, nausea, vomiting, direct abdominal pain, dysuria.  She has not had any sick contacts.  She endorses some chest discomfort with coughing. In the ED, the patient was afebrile hemodynamically stable.  She was mildly tachycardic with heart rate 100-110.  Chest x-ray was negative for infiltrates or edema.  Oxygen saturation was 90% on room air.  She was placed on 2 L with saturation 95-96%.  BMP showed sodium 136, potassium 3.9, serum creatinine 1.06.  WBC 13.9, hemoglobin 14.0, platelets 274,000.  The patient was given bronchodilators and Solu-Medrol.  She continued to have shortness of breath and wheezing.  As result, the patient was admitted for further evaluation and treatment.   Review of Systems: As mentioned in  the history of present illness. All other systems reviewed and are negative. Past Medical History:  Diagnosis Date   Angina pectoris (HCC)    Asthma    "as a child and as an adult" (08/02/2018)   CAD (coronary artery disease), native coronary artery 08/03/2018   Chronic bronchitis (HCC)    Chronic kidney disease    Coronary artery disease    Family history of adverse reaction to anesthesia    "cousin stopped breathing; he was allergic to the anesthesia" (08/02/2018)   GERD (gastroesophageal reflux disease)    High cholesterol    History of gout    History of hiatal hernia    History of kidney stones    Hypertension 07/2018   Interstitial cystitis    NSVT (nonsustained ventricular tachycardia) (HCC) 07/2018   Raynaud's disease    "feet and hands" (08/02/2018)   Raynaud's disease without gangrene 10/15/2018   Past Surgical History:  Procedure Laterality Date   ABDOMINAL HYSTERECTOMY     ANTERIOR CERVICAL DECOMP/DISCECTOMY FUSION     APPENDECTOMY     AUGMENTATION MAMMAPLASTY Bilateral    BACK SURGERY     CORONARY ANGIOPLASTY WITH STENT PLACEMENT  08/02/2018   CORONARY STENT INTERVENTION N/A 08/02/2018   Procedure: CORONARY STENT INTERVENTION;  Surgeon: [name], MD;  Location: [facility];  Service: Cardiovascular;  Laterality: N/A;  RCA    CORONARY STENT INTERVENTION N/A 04/08/2021   Procedure: CORONARY STENT INTERVENTION;  Surgeon: [name], MD;  Location: [facility];  Service: Cardiovascular;  Laterality: N/A;  RCA  and PDA   CYSTOSCOPY W/ STONE MANIPULATION     INTRAVASCULAR PRESSURE WIRE/FFR STUDY N/A 10/07/2018   Procedure: INTRAVASCULAR PRESSURE WIRE/FFR STUDY;  Surgeon: [name], MD;  Location: [facility];  Service: Cardiovascular;  Laterality: N/A;   KNEE ARTHROSCOPY Right    LAPAROSCOPIC CHOLECYSTECTOMY     LEFT HEART CATH AND CORONARY ANGIOGRAPHY N/A 10/07/2018   Procedure: LEFT HEART CATH AND CORONARY ANGIOGRAPHY;  Surgeon: [name], MD;   Location: [facility];  Service: Cardiovascular;  Laterality: N/A;   LEFT HEART CATH AND CORONARY ANGIOGRAPHY N/A 04/08/2021   Procedure: LEFT HEART CATH AND CORONARY ANGIOGRAPHY;  Surgeon: [name], MD;  Location: [facility];  Service: Cardiovascular;  Laterality: N/A;   REPAIR ANKLE LIGAMENT Left    "tied up ligament; had tore up qthing in my ankle"   RIGHT HEART CATH  10/07/2018   RIGHT/LEFT HEART CATH AND CORONARY ANGIOGRAPHY N/A 08/02/2018   Procedure: RIGHT/LEFT HEART CATH AND CORONARY ANGIOGRAPHY;  Surgeon: [name], MD;  Location: [facility];  Service: Cardiovascular;  Laterality: N/A;   SHOULDER SURGERY     bilateral   TONSILLECTOMY     TUBAL LIGATION     Social History:  reports that she has never smoked. She has never used smokeless tobacco. She reports that she does not currently use alcohol. She reports that she does not use drugs.  Allergies  Allergen Reactions   Adhesive [Tape] Other (See Comments)    SKIN WILL TEAR EASILY!!!!   Brilinta [Ticagrelor] Shortness Of Breath   Statins Other (See Comments)    myalgia   Carvedilol     Unknown reaction    Diovan [Valsartan]     Tingling    Hydrochlorothiazide     Unknown reaction    Ibuprofen     Causes bp to go up   Keflex [Cephalexin]     confusion   Morphine And Related     Hyper, ineffective    Norvasc [Amlodipine Besylate]     Numbness and tingling    Nsaids     Esophagus became red and swollen (tolerates meloxicam)    Oxycodone Itching and Other (See Comments)    Welts, also (Tylox)   Prednisone     Elevated BP   Reglan [Metoclopramide]     confusion   Singulair [Montelukast] Diarrhea   Temazepam Hives    Family History  Problem Relation Age of Onset   Kidney failure Mother    Diabetes Mother    Heart disease Father    Heart attack Father    Colon cancer Brother    Liver cancer Brother    Heart disease Other    Arthritis Other    Cancer Other    Asthma Other    Diabetes  Other    Kidney disease Other     Prior to Admission medications   Medication Sig Start Date End Date Taking? Authorizing Provider  acebutolol (SECTRAL) 200 MG capsule TAKE 1 CAPSULE TWICE A DAY 02/10/22   Cantwell, Celeste C, PA-C  acetaminophen (TYLENOL) 500 MG tablet Take 1,000 mg by mouth every 8 (eight) hours as needed for moderate pain or headache.    [provider]  albuterol (VENTOLIN HFA) 108 (90 Base) MCG/ACT inhaler Inhale 1 puff into the lungs every 4 (four) hours as needed for wheezing or shortness of breath.    [provider]  aspirin EC 81 MG tablet Take 81 mg by mouth daily.  [provider]  clopidogrel (PLAVIX) 75 MG tablet Take 1 tablet (75 mg total) by mouth daily. 02/12/22   Cantwell, Celeste C, PA-C  cyanocobalamin (,VITAMIN B-12,) 1000 MCG/ML injection Inject 1,000 mcg into the skin every 30 (thirty) days. 01/31/21   [provider]  cyclobenzaprine (FLEXERIL) 10 MG tablet Take 10 mg by mouth at bedtime.    [provider]  divalproex (DEPAKOTE ER) 250 MG 24 hr tablet Take 1 tablet (250 mg total) by mouth daily. 02/03/22   Ihor Austin, NP  Evolocumab (REPATHA SURECLICK) 140 MG/ML SOAJ PA approved 10/26/2021 through 11/05/2022. 02/23/22   [name], MD  ezetimibe (ZETIA) 10 MG tablet Take 1 tablet (10 mg total) by mouth daily. 02/03/22   Cantwell, Celeste C, PA-C  hydrOXYzine (ATARAX/VISTARIL) 25 MG tablet Take 25 mg by mouth daily as needed for itching. 10/30/19   [provider]  loratadine (CLARITIN) 10 MG tablet Take 10 mg by mouth at bedtime.    [provider]  loratadine (CLARITIN) 10 MG tablet 1 tablet    [provider]  nitroGLYCERIN (NITROSTAT) 0.4 MG SL tablet DISSOLVE 1 TABLET SUBLINGUALLY AS NEEDED FOR CHEST PAIN, MAY REPEAT EVERY5 MINUTES. AFTER 3 CALL 911. 12/30/21   [name], MD  oxybutynin (DITROPAN) 5 MG tablet Take 2.5 mg by mouth at bedtime.  05/02/18   [provider]   pantoprazole (PROTONIX) 20 MG tablet Take 1 tablet (20 mg total) by mouth daily. 02/03/22   Cantwell, Celeste C, PA-C  temazepam (RESTORIL) 15 MG capsule Take 15 mg by mouth at bedtime as needed. 06/20/21   [provider]  traZODone (DESYREL) 50 MG tablet 1-2 tablet at bedtime as needed 07/15/21   [provider]  triazolam (HALCION) 0.25 MG tablet Take 0.25 mg by mouth at bedtime.    [provider]  Vitamin D, Ergocalciferol, (DRISDOL) 1.25 MG (50000 UT) CAPS capsule Take 50,000 Units by mouth every Monday.    [provider]    Physical Exam: Vitals:   04/02/22 0459 04/02/22 0557 04/02/22 0600 04/02/22 0700  BP:   (!) 144/78 138/78  Pulse:   99 (!) 112  Resp:   17 16  Temp:      TempSrc:      SpO2: 97% 98% 98% 98%  Weight:      Height:       GENERAL:  A&O x 3, NAD, well developed, cooperative, follows commands HEENT: [place]/AT, No thrush, No icterus, No oral ulcers Neck:  No neck mass, No meningismus, soft, supple CV: RRR, no S3, no S4, no rub, no JVD Lungs: Bibasilar rales.  Bilateral expiratory wheeze. Abd: soft/NT +BS, nondistended Ext: No edema, no lymphangitis, no cyanosis, no rashes Neuro:  CN II-XII intact, strength 4/5 in RUE, RLE, strength 4/5 LUE, LLE; sensation intact bilateral; no dysmetria; babinski equivocal  Data Reviewed: Data reviewed above in the history  Assessment and Plan: * Acute respiratory failure with hypoxia (HCC) Secondary to asthma exacerbation -Presented with tachypnea and oxygen saturation 90% on room air Start DuoNebs Start Pulmicort Continue IV Solu-Medrol Viral respiratory panel COVID-19 and flu negative Wean oxygen for saturation greater 92% Check VBG  Hyperlipidemia Continue Zetia  GERD (gastroesophageal reflux disease) Continue pantoprazole  Migraine headache Continue current prophylactic medications including Depakote  Asthma, chronic, unspecified asthma severity, with acute  exacerbation Continue DuoNebs Start Pulmicort Continue IV Solu-Medrol Viral respiratory panel  CAD (coronary artery disease), native coronary artery No chest pain presently EKG--sinus rhythm, nonspecific  ST-T wave change Troponin unremarkable  Essential hypertension Continue acebutolol      Advance Care Planning: FULL  Consults: none  Family Communication: spouse updated 7/27  Severity of Illness: The appropriate patient status for this patient is INPATIENT. Inpatient status is judged to be reasonable and necessary in order to provide the required intensity of service to ensure the patient's safety. The patient's presenting symptoms, physical exam findings, and initial radiographic and laboratory data in the context of their chronic comorbidities is felt to place them at high risk for further clinical deterioration. Furthermore, it is not anticipated that the patient will be medically stable for discharge from the hospital within 2 midnights of admission.   * I certify that at the point of admission it is my clinical judgment that the patient will require inpatient hospital care spanning beyond 2 midnights from the point of admission due to high intensity of service, high risk for further deterioration and high frequency of surveillance required.*  Author: Catarina Hartshorn, MD 04/02/2022 7:44 AM  For on call review [email or web address].

## 2022-04-02 NOTE — Assessment & Plan Note (Signed)
Continue current prophylactic medications including Depakote

## 2022-04-02 NOTE — Assessment & Plan Note (Addendum)
No chest pain presently EKG--sinus rhythm, nonspecific ST-T wave change Troponin unremarkable

## 2022-04-02 NOTE — Assessment & Plan Note (Signed)
Continue pantoprazole. °

## 2022-04-02 NOTE — ED Triage Notes (Addendum)
POV from home. Cc of cough and congestion for 2 days that has made her SOB. Denies pain.  States she has been taking tylenol every 2 hours. Productive cough with green mucus secretions. Denies anyone sick at home.  Took a breathing treatment at 7pm. Used home inhaler.  Already talked to PCP. They sent in medication for her.  90% on room air after getting up from wc to stretcher. 92% after. Placed on 2l [place] 95%.

## 2022-04-02 NOTE — Assessment & Plan Note (Addendum)
Continued DuoNebs continued Pulmicort Continue IV Solu-Medrol>>d/c home with prednisone x 4 more days Viral respiratory panel--positive rhinovirus/enterovius Started hycodan for cough

## 2022-04-02 NOTE — Hospital Course (Addendum)
67 year old female with a history of coronary artery disease, asthma, hypertension, GERD, hyperlipidemia, conversion disorder, migraine headaches presenting with coughing, chest congestion, shortness of breath for 2 days.  She states that she initially began coughing just clear type material but in the past 24 hours she has noticed some green sputum.  She denies any hemoptysis.  She has had some subjective fevers and chills.  She states that on average day she does not use her inhalers nor her nebulizer machine; however, she has been using her nebulizers for the past 2 days without much improvement.  She denies any recent illnesses or smoke exposure.  However she states that she has been cleaning her house exposing herself to lots of dust to which she states that she is allergic.  She states that she has never smoked.  She denies any headache, visual disturbance, neck pain, nausea, vomiting, direct abdominal pain, dysuria.  She has not had any sick contacts.  She endorses some chest discomfort with coughing. In the ED, the patient was afebrile hemodynamically stable.  She was mildly tachycardic with heart rate 100-110.  Chest x-ray was negative for infiltrates or edema.  Oxygen saturation was 90% on room air.  She was placed on 2 L with saturation 95-96%.  BMP showed sodium 136, potassium 3.9, serum creatinine 1.06.  WBC 13.9, hemoglobin 14.0, platelets 274,000.  The patient was given bronchodilators and Solu-Medrol.  She continued to have shortness of breath and wheezing.  As result, the patient was admitted for further evaluation and treatment.  Patient slowly improved with IV steroids, bronchodilators.  Her oxygen was weaned off.  On day of d/c, she ambulated on RA without oxygen desaturation.

## 2022-04-02 NOTE — Assessment & Plan Note (Addendum)
Secondary to asthma exacerbation -Presented with tachypnea and oxygen saturation 90% on room air Start DuoNebs Start Pulmicort -7/27 VBG--7.46/37/73/26 (3L) Continue IV Solu-Medrol Viral respiratory panel>>positive rhino/entervirus COVID-19 and flu negative Wean oxygen for saturation greater 92%

## 2022-04-03 DIAGNOSIS — J45901 Unspecified asthma with (acute) exacerbation: Secondary | ICD-10-CM | POA: Diagnosis not present

## 2022-04-03 DIAGNOSIS — J9601 Acute respiratory failure with hypoxia: Secondary | ICD-10-CM | POA: Diagnosis not present

## 2022-04-03 DIAGNOSIS — I10 Essential (primary) hypertension: Secondary | ICD-10-CM | POA: Diagnosis not present

## 2022-04-03 LAB — HEMOGLOBIN A1C
Hgb A1c MFr Bld: 5.4 % (ref 4.8–5.6)
Mean Plasma Glucose: 108.28 mg/dL

## 2022-04-03 MED ORDER — SENNA 8.6 MG PO TABS
2.0000 | ORAL_TABLET | Freq: Every day | ORAL | Status: DC
  Administered 2022-04-03 – 2022-04-04 (×2): 17.2 mg via ORAL
  Filled 2022-04-03 (×3): qty 2

## 2022-04-03 MED ORDER — ALBUTEROL SULFATE (2.5 MG/3ML) 0.083% IN NEBU: 2.5000 mg | INHALATION_SOLUTION | RESPIRATORY_TRACT | Status: DC | PRN

## 2022-04-03 MED ORDER — IPRATROPIUM-ALBUTEROL 0.5-2.5 (3) MG/3ML IN SOLN
3.0000 mL | Freq: Three times a day (TID) | RESPIRATORY_TRACT | Status: DC
  Administered 2022-04-03 – 2022-04-05 (×7): 3 mL via RESPIRATORY_TRACT
  Filled 2022-04-03 (×7): qty 3

## 2022-04-03 MED ORDER — POLYETHYLENE GLYCOL 3350 17 G PO PACK
17.0000 g | PACK | Freq: Every day | ORAL | Status: DC
  Administered 2022-04-03: 17 g via ORAL
  Filled 2022-04-03 (×3): qty 1

## 2022-04-03 NOTE — Progress Notes (Signed)
  Transition of Care (TOC) Screening Note   Patient Details  Name: Sandeep Delagarza Mosco Date of Birth: Feb 13, 1956   [facility] CM/SW Contact:    Annice Needy, LCSW Phone Number: 04/03/2022, 1:05 PM    Transition of Care Department [facility]) has reviewed patient and no TOC needs have been identified at this time. We will continue to monitor patient advancement through interdisciplinary progression rounds. If new patient transition needs arise, please place a TOC consult.

## 2022-04-03 NOTE — Progress Notes (Signed)
Patient has rested well through night.  Has had no complaints of respiratory distress.  Vitals stable.  No new issues with patient.

## 2022-04-03 NOTE — Progress Notes (Signed)
PROGRESS NOTE  Tonya Boyd YQM:[id number] DOB: 1955-11-22 DOA: 04/02/2022 PCP: Georgianne Fick, MD  Brief History:  66 year old female with a history of coronary artery disease, asthma, hypertension, GERD, hyperlipidemia, conversion disorder, migraine headaches presenting with coughing, chest congestion, shortness of breath for 2 days.  She states that she initially began coughing just clear type material but in the past 24 hours she has noticed some green sputum.  She denies any hemoptysis.  She has had some subjective fevers and chills.  She states that on average day she does not use her inhalers nor her nebulizer machine; however, she has been using her nebulizers for the past 2 days without much improvement.  She denies any recent illnesses or smoke exposure.  However she states that she has been cleaning her house exposing herself to lots of dust to which she states that she is allergic.  She states that she has never smoked.  She denies any headache, visual disturbance, neck pain, nausea, vomiting, direct abdominal pain, dysuria.  She has not had any sick contacts.  She endorses some chest discomfort with coughing. In the ED, the patient was afebrile hemodynamically stable.  She was mildly tachycardic with heart rate 100-110.  Chest x-ray was negative for infiltrates or edema.  Oxygen saturation was 90% on room air.  She was placed on 2 L with saturation 95-96%.  BMP showed sodium 136, potassium 3.9, serum creatinine 1.06.  WBC 13.9, hemoglobin 14.0, platelets 274,000.  The patient was given bronchodilators and Solu-Medrol.  She continued to have shortness of breath and wheezing.  As result, the patient was admitted for further evaluation and treatment.     Assessment and Plan: * Acute respiratory failure with hypoxia (HCC) Secondary to asthma exacerbation -Presented with tachypnea and oxygen saturation 90% on room air Start DuoNebs Start Pulmicort -7/27 VBG--7.46/37/73/26  (3L) Continue IV Solu-Medrol Viral respiratory panel>>positive rhino/entervirus COVID-19 and flu negative Wean oxygen for saturation greater 92%  Hyperlipidemia Continue Zetia  GERD (gastroesophageal reflux disease) Continue pantoprazole  Migraine headache Continue current prophylactic medications including Depakote  Asthma, chronic, unspecified asthma severity, with acute exacerbation Continue DuoNebs continue Pulmicort Continue IV Solu-Medrol Viral respiratory panel--positive rhinovirus/enterovius  CAD (coronary artery disease), native coronary artery No chest pain presently EKG--sinus rhythm, nonspecific ST-T wave change Troponin unremarkable  Essential hypertension Continue acebutolol      Family Communication:   spouse updated at bedside 7/28  Consultants:  none  Code Status:  FULL  DVT Prophylaxis:  [place] Lovenox   Procedures: As Listed in Progress Note Above  Antibiotics: None       Subjective: Pt states she is breathing better but still has dyspnea on exertion.  Denies f/c, cp, n/v/d.  Still has mostly dry cough.  Denies abd pain, hematochezia, melena  Objective: Vitals:   04/02/22 2038 04/03/22 0439 04/03/22 0821 04/03/22 0823  BP: 133/79 (!) 142/62    Pulse: 94 79    Resp: 20 19    Temp: 97.6 F (36.4 C) 97.6 F (36.4 C)    TempSrc: Oral     SpO2: 97% 99% 98% 100%  Weight:      Height:        Intake/Output Summary (Last 24 hours) at 04/03/2022 0945 Last data filed at 04/02/2022 1900 Gross per 24 hour  Intake 480 ml  Output --  Net 480 ml   Weight change:  Exam:  General:  Pt is alert, follows commands  appropriately, not in acute distress HEENT: No icterus, No thrush, No neck mass, [place]/AT Cardiovascular: RRR, S1/S2, no rubs, no gallops Respiratory: diminished BS bilateral.  Bibasilar wheeze and rales Abdomen: Soft/+BS, non tender, non distended, no guarding Extremities: No edema, No lymphangitis, No petechiae, No rashes, no  synovitis   Data Reviewed: I have personally reviewed following labs and imaging studies Basic Metabolic Panel: Recent Labs  Lab 04/02/22 0444  NA 136  K 3.9  CL 102  CO2 25  GLUCOSE 135*  BUN 13  CREATININE 1.06*  CALCIUM 9.0   Liver Function Tests: No results for input(s): "AST", "ALT", "ALKPHOS", "BILITOT", "PROT", "ALBUMIN" in the last 168 hours. No results for input(s): "LIPASE", "AMYLASE" in the last 168 hours. No results for input(s): "AMMONIA" in the last 168 hours. Coagulation Profile: No results for input(s): "INR", "PROTIME" in the last 168 hours. CBC: Recent Labs  Lab 04/02/22 0444  WBC 13.9*  NEUTROABS 11.3*  HGB 14.0  HCT 42.1  MCV 95.7  PLT 274   Cardiac Enzymes: No results for input(s): "CKTOTAL", "CKMB", "CKMBINDEX", "TROPONINI" in the last 168 hours. BNP: Invalid input(s): "POCBNP" CBG: No results for input(s): "GLUCAP" in the last 168 hours. HbA1C: Recent Labs    04/03/22 0458  HGBA1C 5.4   Urine analysis:    Component Value Date/Time   COLORURINE STRAW (A) 07/27/2021 1324   APPEARANCEUR CLEAR 07/27/2021 1324   LABSPEC 1.006 07/27/2021 1324   PHURINE 6.0 07/27/2021 1324   GLUCOSEU NEGATIVE 07/27/2021 1324   HGBUR MODERATE (A) 07/27/2021 1324   BILIRUBINUR NEGATIVE 07/27/2021 1324   KETONESUR NEGATIVE 07/27/2021 1324   PROTEINUR NEGATIVE 07/27/2021 1324   NITRITE NEGATIVE 07/27/2021 1324   LEUKOCYTESUR NEGATIVE 07/27/2021 1324   Sepsis Labs: @LABRCNTIP (procalcitonin:4,lacticidven:4) ) Recent Results (from the past 240 hour(s))  Resp Panel by RT-PCR (Flu A&B, Covid) Anterior Nasal Swab     Status: None   Collection Time: 04/02/22  4:45 AM   Specimen: Anterior Nasal Swab  Result Value Ref Range Status   SARS Coronavirus 2 by RT PCR NEGATIVE NEGATIVE Final    Comment: (NOTE) SARS-CoV-2 target nucleic acids are NOT DETECTED.  The SARS-CoV-2 RNA is generally detectable in upper respiratory specimens during the acute phase of  infection. The lowest concentration of SARS-CoV-2 viral copies this assay can detect is 138 copies/mL. A negative result does not preclude SARS-Cov-2 infection and should not be used as the sole basis for treatment or other patient management decisions. A negative result may occur with  improper specimen collection/handling, submission of specimen other than nasopharyngeal swab, presence of viral mutation(s) within the areas targeted by this assay, and inadequate number of viral copies(<138 copies/mL). A negative result must be combined with clinical observations, patient history, and epidemiological information. The expected result is Negative.  Fact Sheet for Patients:  04/04/22  Fact Sheet for Healthcare Providers:  BloggerCourse.com  This test is no t yet approved or cleared by the [place] FDA and  has been authorized for detection and/or diagnosis of SARS-CoV-2 by FDA under an Emergency Use Authorization (EUA). This EUA will remain  in effect (meaning this test can be used) for the duration of the COVID-19 declaration under Section 564(b)(1) of the Act, 21 U.S.C.section 360bbb-3(b)(1), unless the authorization is terminated  or revoked sooner.       Influenza A by PCR NEGATIVE NEGATIVE Final   Influenza B by PCR NEGATIVE NEGATIVE Final    Comment: (NOTE) The Xpert Xpress SARS-CoV-2/FLU/RSV plus assay is  intended as an aid in the diagnosis of influenza from Nasopharyngeal swab specimens and should not be used as a sole basis for treatment. Nasal washings and aspirates are unacceptable for Xpert Xpress SARS-CoV-2/FLU/RSV testing.  Fact Sheet for Patients: BloggerCourse.com  Fact Sheet for Healthcare Providers: SeriousBroker.it  This test is not yet approved or cleared by the [place] FDA and has been authorized for detection and/or diagnosis of SARS-CoV-2  by FDA under an Emergency Use Authorization (EUA). This EUA will remain in effect (meaning this test can be used) for the duration of the COVID-19 declaration under Section 564(b)(1) of the Act, 21 U.S.C. section 360bbb-3(b)(1), unless the authorization is terminated or revoked.  Performed at [facility], [address] [place], [place] [address]   Respiratory (~20 pathogens) panel by PCR     Status: Abnormal   Collection Time: 04/02/22  8:47 AM   Specimen: Nasopharyngeal Swab; Respiratory  Result Value Ref Range Status   Adenovirus NOT DETECTED NOT DETECTED Final   Coronavirus 229E NOT DETECTED NOT DETECTED Final    Comment: (NOTE) The Coronavirus on the Respiratory Panel, DOES NOT test for the novel  Coronavirus (2019 nCoV)    Coronavirus HKU1 NOT DETECTED NOT DETECTED Final   Coronavirus NL63 NOT DETECTED NOT DETECTED Final   Coronavirus OC43 NOT DETECTED NOT DETECTED Final   Metapneumovirus NOT DETECTED NOT DETECTED Final   Rhinovirus / Enterovirus DETECTED (A) NOT DETECTED Final   Influenza A NOT DETECTED NOT DETECTED Final   Influenza B NOT DETECTED NOT DETECTED Final   Parainfluenza Virus 1 NOT DETECTED NOT DETECTED Final   Parainfluenza Virus 2 NOT DETECTED NOT DETECTED Final   Parainfluenza Virus 3 NOT DETECTED NOT DETECTED Final   Parainfluenza Virus 4 NOT DETECTED NOT DETECTED Final   Respiratory Syncytial Virus NOT DETECTED NOT DETECTED Final   Bordetella pertussis NOT DETECTED NOT DETECTED Final   Bordetella Parapertussis NOT DETECTED NOT DETECTED Final   Chlamydophila pneumoniae NOT DETECTED NOT DETECTED Final   Mycoplasma pneumoniae NOT DETECTED NOT DETECTED Final    Comment: Performed at [facility], [address] [place], [place] [address]     Scheduled Meds:  acebutolol  200 mg Oral BID   budesonide (PULMICORT) nebulizer solution  0.5 mg Nebulization BID   clopidogrel  75 mg Oral Daily   cyclobenzaprine  10 mg Oral QHS   divalproex  250 mg Oral  Daily   enoxaparin (LOVENOX) injection  40 mg Subcutaneous Q24H   ezetimibe  10 mg Oral Daily   ipratropium-albuterol  3 mL Nebulization TID   loratadine  10 mg Oral QHS   methylPREDNISolone (SOLU-MEDROL) injection  60 mg Intravenous Q12H   oxybutynin  2.5 mg Oral QHS   pantoprazole  40 mg Oral Daily   Continuous Infusions:  Procedures/Studies: DG Chest Portable 1 View  Result Date: 04/02/2022 CLINICAL DATA:  66 year old female with cough and shortness of breath for 2 days. EXAM: PORTABLE CHEST 1 VIEW COMPARISON:  Chest radiographs 01/09/2017 and earlier. FINDINGS: Portable AP upright view at 0423 hours. The patient is mildly rotated to the left. Chronic cervical ACDF. Mildly lower lung volumes. Mediastinal contours remain normal. Visualized tracheal air column is within normal limits. Allowing for portable technique the lungs are clear. No pneumothorax or pleural effusion. Negative visible bowel gas. No acute osseous abnormality identified. IMPRESSION: No acute cardiopulmonary abnormality. Electronically Signed   By: Odessa Fleming M.D.   On: 04/02/2022 04:49    Catarina Hartshorn, DO  [facility]  If 7PM-7AM, please contact night-coverage [email or web address] Password [email or web address] 04/03/2022, 9:45 AM   LOS: 1 day

## 2022-04-04 DIAGNOSIS — J45901 Unspecified asthma with (acute) exacerbation: Secondary | ICD-10-CM | POA: Diagnosis not present

## 2022-04-04 DIAGNOSIS — I10 Essential (primary) hypertension: Secondary | ICD-10-CM | POA: Diagnosis not present

## 2022-04-04 DIAGNOSIS — J9601 Acute respiratory failure with hypoxia: Secondary | ICD-10-CM | POA: Diagnosis not present

## 2022-04-04 NOTE — Progress Notes (Signed)
PROGRESS NOTE  Tonya Boyd SVX:[id number] DOB: October 21, 1955 DOA: 04/02/2022 PCP: Georgianne Fick, MD  Brief History:  66 year old female with a history of coronary artery disease, asthma, hypertension, GERD, hyperlipidemia, conversion disorder, migraine headaches presenting with coughing, chest congestion, shortness of breath for 2 days.  She states that she initially began coughing just clear type material but in the past 24 hours she has noticed some green sputum.  She denies any hemoptysis.  She has had some subjective fevers and chills.  She states that on average day she does not use her inhalers nor her nebulizer machine; however, she has been using her nebulizers for the past 2 days without much improvement.  She denies any recent illnesses or smoke exposure.  However she states that she has been cleaning her house exposing herself to lots of dust to which she states that she is allergic.  She states that she has never smoked.  She denies any headache, visual disturbance, neck pain, nausea, vomiting, direct abdominal pain, dysuria.  She has not had any sick contacts.  She endorses some chest discomfort with coughing. In the ED, the patient was afebrile hemodynamically stable.  She was mildly tachycardic with heart rate 100-110.  Chest x-ray was negative for infiltrates or edema.  Oxygen saturation was 90% on room air.  She was placed on 2 L with saturation 95-96%.  BMP showed sodium 136, potassium 3.9, serum creatinine 1.06.  WBC 13.9, hemoglobin 14.0, platelets 274,000.  The patient was given bronchodilators and Solu-Medrol.  She continued to have shortness of breath and wheezing.  As result, the patient was admitted for further evaluation and treatment.     Assessment and Plan: * Acute respiratory failure with hypoxia (HCC) Secondary to asthma exacerbation -Presented with tachypnea and oxygen saturation 90% on room air Continue DuoNebs Continue Pulmicort -7/27  VBG--7.46/37/73/26 (3L) Continue IV Solu-Medrol Viral respiratory panel>>positive rhino/entervirus COVID-19 and flu negative Wean oxygen for saturation greater 92% Continues to have sob with minimal exertion  Hyperlipidemia Continue Zetia  GERD (gastroesophageal reflux disease) Continue pantoprazole  Migraine headache Continue current prophylactic medications including Depakote  Asthma, chronic, unspecified asthma severity, with acute exacerbation Continue DuoNebs continue Pulmicort Continue IV Solu-Medrol Viral respiratory panel--positive rhinovirus/enterovius  CAD (coronary artery disease), native coronary artery No chest pain presently EKG--sinus rhythm, nonspecific ST-T wave change Troponin unremarkable  Essential hypertension Continue acebutolol      Family Communication:   spouse updated at bedside 7/29  Consultants:  none  Code Status:  FULL  DVT Prophylaxis: [place] Lovenox   Procedures: As Listed in Progress Note Above  Antibiotics: None     Subjective: Patient complains of sob with minimal exertion.  Still complains of dry cough.  Denies f/c, cp, n/v/d, abd pain  Objective: Vitals:   04/03/22 2029 04/03/22 2158 04/04/22 0440 04/04/22 0842  BP:  128/63 (!) 147/87   Pulse:  86 81   Resp:  19 19   Temp:  97.9 F (36.6 C) 97.9 F (36.6 C)   TempSrc:      SpO2: 94% 98% 97% 90%  Weight:      Height:        Intake/Output Summary (Last 24 hours) at 04/04/2022 1013 Last data filed at 04/04/2022 0842 Gross per 24 hour  Intake 960 ml  Output --  Net 960 ml   Weight change:  Exam:  General:  Pt is alert, follows commands appropriately, not in acute distress  HEENT: No icterus, No thrush, No neck mass, [place]/AT Cardiovascular: RRR, S1/S2, no rubs, no gallops Respiratory: diminished BS,  bibasilar rales.  Mild bibasilar wheeze Abdomen: Soft/+BS, non tender, non distended, no guarding Extremities: No edema, No lymphangitis, No petechiae, No rashes,  no synovitis   Data Reviewed: I have personally reviewed following labs and imaging studies Basic Metabolic Panel: Recent Labs  Lab 04/02/22 0444  NA 136  K 3.9  CL 102  CO2 25  GLUCOSE 135*  BUN 13  CREATININE 1.06*  CALCIUM 9.0   Liver Function Tests: No results for input(s): "AST", "ALT", "ALKPHOS", "BILITOT", "PROT", "ALBUMIN" in the last 168 hours. No results for input(s): "LIPASE", "AMYLASE" in the last 168 hours. No results for input(s): "AMMONIA" in the last 168 hours. Coagulation Profile: No results for input(s): "INR", "PROTIME" in the last 168 hours. CBC: Recent Labs  Lab 04/02/22 0444  WBC 13.9*  NEUTROABS 11.3*  HGB 14.0  HCT 42.1  MCV 95.7  PLT 274   Cardiac Enzymes: No results for input(s): "CKTOTAL", "CKMB", "CKMBINDEX", "TROPONINI" in the last 168 hours. BNP: Invalid input(s): "POCBNP" CBG: No results for input(s): "GLUCAP" in the last 168 hours. HbA1C: Recent Labs    04/03/22 0458  HGBA1C 5.4   Urine analysis:    Component Value Date/Time   COLORURINE STRAW (A) 07/27/2021 1324   APPEARANCEUR CLEAR 07/27/2021 1324   LABSPEC 1.006 07/27/2021 1324   PHURINE 6.0 07/27/2021 1324   GLUCOSEU NEGATIVE 07/27/2021 1324   HGBUR MODERATE (A) 07/27/2021 1324   BILIRUBINUR NEGATIVE 07/27/2021 1324   KETONESUR NEGATIVE 07/27/2021 1324   PROTEINUR NEGATIVE 07/27/2021 1324   NITRITE NEGATIVE 07/27/2021 1324   LEUKOCYTESUR NEGATIVE 07/27/2021 1324   Sepsis Labs: @LABRCNTIP (procalcitonin:4,lacticidven:4) ) Recent Results (from the past 240 hour(s))  Resp Panel by RT-PCR (Flu A&B, Covid) Anterior Nasal Swab     Status: None   Collection Time: 04/02/22  4:45 AM   Specimen: Anterior Nasal Swab  Result Value Ref Range Status   SARS Coronavirus 2 by RT PCR NEGATIVE NEGATIVE Final    Comment: (NOTE) SARS-CoV-2 target nucleic acids are NOT DETECTED.  The SARS-CoV-2 RNA is generally detectable in upper respiratory specimens during the acute phase of  infection. The lowest concentration of SARS-CoV-2 viral copies this assay can detect is 138 copies/mL. A negative result does not preclude SARS-Cov-2 infection and should not be used as the sole basis for treatment or other patient management decisions. A negative result may occur with  improper specimen collection/handling, submission of specimen other than nasopharyngeal swab, presence of viral mutation(s) within the areas targeted by this assay, and inadequate number of viral copies(<138 copies/mL). A negative result must be combined with clinical observations, patient history, and epidemiological information. The expected result is Negative.  Fact Sheet for Patients:  04/04/22  Fact Sheet for Healthcare Providers:  BloggerCourse.com  This test is no t yet approved or cleared by the [employer] FDA and  has been authorized for detection and/or diagnosis of SARS-CoV-2 by FDA under an Emergency Use Authorization (EUA). This EUA will remain  in effect (meaning this test can be used) for the duration of the COVID-19 declaration under Section 564(b)(1) of the Act, 21 U.S.C.section 360bbb-3(b)(1), unless the authorization is terminated  or revoked sooner.       Influenza A by PCR NEGATIVE NEGATIVE Final   Influenza B by PCR NEGATIVE NEGATIVE Final    Comment: (NOTE) The Xpert Xpress SARS-CoV-2/FLU/RSV plus assay is intended as an aid  in the diagnosis of influenza from Nasopharyngeal swab specimens and should not be used as a sole basis for treatment. Nasal washings and aspirates are unacceptable for Xpert Xpress SARS-CoV-2/FLU/RSV testing.  Fact Sheet for Patients: BloggerCourse.com  Fact Sheet for Healthcare Providers: SeriousBroker.it  This test is not yet approved or cleared by the [place] FDA and has been authorized for detection and/or diagnosis of SARS-CoV-2  by FDA under an Emergency Use Authorization (EUA). This EUA will remain in effect (meaning this test can be used) for the duration of the COVID-19 declaration under Section 564(b)(1) of the Act, 21 U.S.C. section 360bbb-3(b)(1), unless the authorization is terminated or revoked.  Performed at [facility], [address] [place], [place] [address]   Respiratory (~20 pathogens) panel by PCR     Status: Abnormal   Collection Time: 04/02/22  8:47 AM   Specimen: Nasopharyngeal Swab; Respiratory  Result Value Ref Range Status   Adenovirus NOT DETECTED NOT DETECTED Final   Coronavirus 229E NOT DETECTED NOT DETECTED Final    Comment: (NOTE) The Coronavirus on the Respiratory Panel, DOES NOT test for the novel  Coronavirus (2019 nCoV)    Coronavirus HKU1 NOT DETECTED NOT DETECTED Final   Coronavirus NL63 NOT DETECTED NOT DETECTED Final   Coronavirus OC43 NOT DETECTED NOT DETECTED Final   Metapneumovirus NOT DETECTED NOT DETECTED Final   Rhinovirus / Enterovirus DETECTED (A) NOT DETECTED Final   Influenza A NOT DETECTED NOT DETECTED Final   Influenza B NOT DETECTED NOT DETECTED Final   Parainfluenza Virus 1 NOT DETECTED NOT DETECTED Final   Parainfluenza Virus 2 NOT DETECTED NOT DETECTED Final   Parainfluenza Virus 3 NOT DETECTED NOT DETECTED Final   Parainfluenza Virus 4 NOT DETECTED NOT DETECTED Final   Respiratory Syncytial Virus NOT DETECTED NOT DETECTED Final   Bordetella pertussis NOT DETECTED NOT DETECTED Final   Bordetella Parapertussis NOT DETECTED NOT DETECTED Final   Chlamydophila pneumoniae NOT DETECTED NOT DETECTED Final   Mycoplasma pneumoniae NOT DETECTED NOT DETECTED Final    Comment: Performed at [facility], [address], [place], [place] [address]     Scheduled Meds:  acebutolol  200 mg Oral BID   budesonide (PULMICORT) nebulizer solution  0.5 mg Nebulization BID   clopidogrel  75 mg Oral Daily   cyclobenzaprine  10 mg Oral QHS   divalproex  250 mg Oral  Daily   enoxaparin (LOVENOX) injection  40 mg Subcutaneous Q24H   ezetimibe  10 mg Oral Daily   ipratropium-albuterol  3 mL Nebulization TID   loratadine  10 mg Oral QHS   methylPREDNISolone (SOLU-MEDROL) injection  60 mg Intravenous Q12H   oxybutynin  2.5 mg Oral QHS   pantoprazole  40 mg Oral Daily   polyethylene glycol  17 g Oral Daily   senna  2 tablet Oral Daily   Continuous Infusions:  Procedures/Studies: DG Chest Portable 1 View  Result Date: 04/02/2022 CLINICAL DATA:  66 year old female with cough and shortness of breath for 2 days. EXAM: PORTABLE CHEST 1 VIEW COMPARISON:  Chest radiographs 01/09/2017 and earlier. FINDINGS: Portable AP upright view at 0423 hours. The patient is mildly rotated to the left. Chronic cervical ACDF. Mildly lower lung volumes. Mediastinal contours remain normal. Visualized tracheal air column is within normal limits. Allowing for portable technique the lungs are clear. No pneumothorax or pleural effusion. Negative visible bowel gas. No acute osseous abnormality identified. IMPRESSION: No acute cardiopulmonary abnormality. Electronically Signed   By: Rexene Edison  Margo Aye M.D.   On: 04/02/2022 04:49    Catarina Hartshorn, DO  [facility]  If 7PM-7AM, please contact night-coverage [email or web address] Password [email or web address] 04/04/2022, 10:13 AM   LOS: 2 days

## 2022-04-05 DIAGNOSIS — J9601 Acute respiratory failure with hypoxia: Secondary | ICD-10-CM | POA: Diagnosis not present

## 2022-04-05 DIAGNOSIS — J4552 Severe persistent asthma with status asthmaticus: Principal | ICD-10-CM

## 2022-04-05 DIAGNOSIS — I10 Essential (primary) hypertension: Secondary | ICD-10-CM | POA: Diagnosis not present

## 2022-04-05 MED ORDER — PREDNISONE 20 MG PO TABS: 50.0000 mg | ORAL_TABLET | Freq: Every day | ORAL | Status: DC

## 2022-04-05 MED ORDER — IPRATROPIUM-ALBUTEROL 0.5-2.5 (3) MG/3ML IN SOLN: 3.0000 mL | Freq: Three times a day (TID) | RESPIRATORY_TRACT | 1 refills | Status: DC

## 2022-04-05 MED ORDER — HYDROCODONE BIT-HOMATROP MBR 5-1.5 MG/5ML PO SOLN
5.0000 mL | ORAL | Status: DC | PRN
  Administered 2022-04-05: 5 mL via ORAL
  Filled 2022-04-05: qty 5

## 2022-04-05 MED ORDER — PREDNISONE 50 MG PO TABS: 50.0000 mg | ORAL_TABLET | Freq: Every day | ORAL | 0 refills | Status: DC

## 2022-04-05 MED ORDER — HYDROCODONE BIT-HOMATROP MBR 5-1.5 MG/5ML PO SOLN: 5.0000 mL | ORAL | 0 refills | Status: DC | PRN

## 2022-04-05 NOTE — Progress Notes (Signed)
Walked in hallway with Dr. Arbutus Leas and O2 sat remained above 95% on room air.  Order in for hycodan to relieve cough which is main complaint causing ribs to hurt.  Dose given.  IV removed and discharge instructions reviewed with scripts sent to pharmacy.  Husband present and to drive home

## 2022-04-05 NOTE — Discharge Summary (Addendum)
Physician Discharge Summary   Patient: Tonya Boyd MRN: [id number] DOB: [birth year]  Admit date:     04/02/2022  Discharge date: 04/05/22  Discharge Physician: Onalee Hua Marleah Beever   PCP: Georgianne Fick, MD   Recommendations at discharge:   Please follow up with primary care provider within 1-2 weeks  Please repeat BMP and CBC in one week     Hospital Course: 66 year old female with a history of coronary artery disease, asthma, hypertension, GERD, hyperlipidemia, conversion disorder, migraine headaches presenting with coughing, chest congestion, shortness of breath for 2 days.  She states that she initially began coughing just clear type material but in the past 24 hours she has noticed some green sputum.  She denies any hemoptysis.  She has had some subjective fevers and chills.  She states that on average day she does not use her inhalers nor her nebulizer machine; however, she has been using her nebulizers for the past 2 days without much improvement.  She denies any recent illnesses or smoke exposure.  However she states that she has been cleaning her house exposing herself to lots of dust to which she states that she is allergic.  She states that she has never smoked.  She denies any headache, visual disturbance, neck pain, nausea, vomiting, direct abdominal pain, dysuria.  She has not had any sick contacts.  She endorses some chest discomfort with coughing. In the ED, the patient was afebrile hemodynamically stable.  She was mildly tachycardic with heart rate 100-110.  Chest x-ray was negative for infiltrates or edema.  Oxygen saturation was 90% on room air.  She was placed on 2 L with saturation 95-96%.  BMP showed sodium 136, potassium 3.9, serum creatinine 1.06.  WBC 13.9, hemoglobin 14.0, platelets 274,000.  The patient was given bronchodilators and Solu-Medrol.  She continued to have shortness of breath and wheezing.  As result, the patient was admitted for further evaluation and treatment.   Patient slowly improved with IV steroids, bronchodilators.  Her oxygen was weaned off.  On day of d/c, she ambulated on RA without oxygen desaturation.   Assessment and Plan: * Acute respiratory failure with hypoxia (HCC) Secondary to asthma exacerbation -Presented with tachypnea and oxygen saturation 90% on room air Continue DuoNebs Continue Pulmicort -7/27 VBG--7.46/37/73/26 (3L) Continue IV Solu-Medrol>>d/c home with prednisone x 4 more days Viral respiratory panel>>positive rhino/entervirus COVID-19 and flu negative Wean oxygen for saturation greater 92% Weaned to RA 7/30--ambulated hall on RA without oxygen desaturation  Hyperlipidemia Continue Zetia  GERD (gastroesophageal reflux disease) Continue pantoprazole  Migraine headache Continue current prophylactic medications including Depakote  Asthma, chronic, unspecified asthma severity, with acute exacerbation Continued DuoNebs continued Pulmicort Continue IV Solu-Medrol>>d/c home with prednisone x 4 more days Viral respiratory panel--positive rhinovirus/enterovius Started hycodan for cough  CAD (coronary artery disease), native coronary artery No chest pain presently EKG--sinus rhythm, nonspecific ST-T wave change Troponin unremarkable  Essential hypertension Continue acebutolol         Consultants: none Procedures performed: none  Disposition: Home Diet recommendation:  Cardiac diet DISCHARGE MEDICATION: Allergies as of 04/05/2022       Reactions   Adhesive [tape] Other (See Comments)   SKIN WILL TEAR EASILY!!!!   Brilinta [ticagrelor] Shortness Of Breath   Statins Other (See Comments)   myalgia   Carvedilol    Unknown reaction    Diovan [valsartan]    Tingling    Hydrochlorothiazide    Unknown reaction    Ibuprofen    Causes bp to  go up   Keflex [cephalexin]    confusion   Morphine And Related    Hyper, ineffective    Norvasc [amlodipine Besylate]    Numbness and tingling    Nsaids     Esophagus became red and swollen (tolerates meloxicam)    Oxycodone Itching, Other (See Comments)   Welts, also (Tylox)   Prednisone    Elevated BP   Reglan [metoclopramide]    confusion   Singulair [montelukast] Diarrhea   Temazepam Hives        Medication List     TAKE these medications    acebutolol 200 MG capsule Commonly known as: SECTRAL TAKE 1 CAPSULE TWICE A DAY What changed:  how much to take how to take this when to take this additional instructions   acetaminophen 500 MG tablet Commonly known as: TYLENOL Take 1,000 mg by mouth every 8 (eight) hours as needed for moderate pain or headache.   albuterol 108 (90 Base) MCG/ACT inhaler Commonly known as: VENTOLIN HFA Inhale 1 puff into the lungs every 4 (four) hours as needed for wheezing or shortness of breath.   aspirin EC 81 MG tablet Take 81 mg by mouth daily.   clopidogrel 75 MG tablet Commonly known as: Plavix Take 1 tablet (75 mg total) by mouth daily.   cyanocobalamin 1000 MCG/ML injection Commonly known as: VITAMIN B12 Inject 1,000 mcg into the skin every 30 (thirty) days.   cyclobenzaprine 10 MG tablet Commonly known as: FLEXERIL Take 10 mg by mouth at bedtime.   divalproex 250 MG 24 hr tablet Commonly known as: DEPAKOTE ER Take 1 tablet (250 mg total) by mouth daily.   ezetimibe 10 MG tablet Commonly known as: ZETIA Take 1 tablet (10 mg total) by mouth daily.   HYDROcodone bit-homatropine 5-1.5 MG/5ML syrup Commonly known as: HYCODAN Take 5 mLs by mouth every 4 (four) hours as needed for cough.   hydrOXYzine 25 MG tablet Commonly known as: ATARAX Take 25 mg by mouth daily as needed for itching.   ipratropium-albuterol 0.5-2.5 (3) MG/3ML Soln Commonly known as: DUONEB Take 3 mLs by nebulization 3 (three) times daily.   loratadine 10 MG tablet Commonly known as: CLARITIN Take 10 mg by mouth at bedtime.   nitroGLYCERIN 0.4 MG SL tablet Commonly known as: NITROSTAT DISSOLVE 1  TABLET SUBLINGUALLY AS NEEDED FOR CHEST PAIN, MAY REPEAT EVERY5 MINUTES. AFTER 3 CALL 911. What changed: See the new instructions.   oxybutynin 5 MG tablet Commonly known as: DITROPAN Take 2.5 mg by mouth daily as needed for bladder spasms.   pantoprazole 20 MG tablet Commonly known as: PROTONIX Take 1 tablet (20 mg total) by mouth daily.   predniSONE 50 MG tablet Commonly known as: DELTASONE Take 1 tablet (50 mg total) by mouth daily with breakfast. Start taking on: April 06, 2022   Repatha SureClick 140 MG/ML Soaj Generic drug: Evolocumab PA approved 10/26/2021 through 11/05/2022. What changed: when to take this   traZODone 50 MG tablet Commonly known as: DESYREL Take 100 mg by mouth at bedtime.   Vitamin D (Ergocalciferol) 1.25 MG (50000 UNIT) Caps capsule Commonly known as: DRISDOL Take 50,000 Units by mouth every Monday.        Discharge Exam: Filed Weights   04/02/22 0412  Weight: 90.7 kg   HEENT:  /AT, No thrush, no icterus CV:  RRR, no rub, no S3, no S4 Lung:  bibasilar rales.  No wheeze Abd:  soft/+BS, NT Ext:  No edema, no lymphangitis,  no synovitis, no rash   Condition at discharge: stable  The results of significant diagnostics from this hospitalization (including imaging, microbiology, ancillary and laboratory) are listed below for reference.   Imaging Studies: DG Chest Portable 1 View  Result Date: 04/02/2022 CLINICAL DATA:  66 year old female with cough and shortness of breath for 2 days. EXAM: PORTABLE CHEST 1 VIEW COMPARISON:  Chest radiographs 01/09/2017 and earlier. FINDINGS: Portable AP upright view at 0423 hours. The patient is mildly rotated to the left. Chronic cervical ACDF. Mildly lower lung volumes. Mediastinal contours remain normal. Visualized tracheal air column is within normal limits. Allowing for portable technique the lungs are clear. No pneumothorax or pleural effusion. Negative visible bowel gas. No acute osseous abnormality  identified. IMPRESSION: No acute cardiopulmonary abnormality. Electronically Signed   By: Odessa Fleming M.D.   On: 04/02/2022 04:49    Microbiology: Results for orders placed or performed during the hospital encounter of 04/02/22  Resp Panel by RT-PCR (Flu A&B, Covid) Anterior Nasal Swab     Status: None   Collection Time: 04/02/22  4:45 AM   Specimen: Anterior Nasal Swab  Result Value Ref Range Status   SARS Coronavirus 2 by RT PCR NEGATIVE NEGATIVE Final    Comment: (NOTE) SARS-CoV-2 target nucleic acids are NOT DETECTED.  The SARS-CoV-2 RNA is generally detectable in upper respiratory specimens during the acute phase of infection. The lowest concentration of SARS-CoV-2 viral copies this assay can detect is 138 copies/mL. A negative result does not preclude SARS-Cov-2 infection and should not be used as the sole basis for treatment or other patient management decisions. A negative result may occur with  improper specimen collection/handling, submission of specimen other than nasopharyngeal swab, presence of viral mutation(s) within the areas targeted by this assay, and inadequate number of viral copies(<138 copies/mL). A negative result must be combined with clinical observations, patient history, and epidemiological information. The expected result is Negative.  Fact Sheet for Patients:  BloggerCourse.com  Fact Sheet for Healthcare Providers:  SeriousBroker.it  This test is no t yet approved or cleared by the [place] FDA and  has been authorized for detection and/or diagnosis of SARS-CoV-2 by FDA under an Emergency Use Authorization (EUA). This EUA will remain  in effect (meaning this test can be used) for the duration of the COVID-19 declaration under Section 564(b)(1) of the Act, 21 U.S.C.section 360bbb-3(b)(1), unless the authorization is terminated  or revoked sooner.       Influenza A by PCR NEGATIVE NEGATIVE Final    Influenza B by PCR NEGATIVE NEGATIVE Final    Comment: (NOTE) The Xpert Xpress SARS-CoV-2/FLU/RSV plus assay is intended as an aid in the diagnosis of influenza from Nasopharyngeal swab specimens and should not be used as a sole basis for treatment. Nasal washings and aspirates are unacceptable for Xpert Xpress SARS-CoV-2/FLU/RSV testing.  Fact Sheet for Patients: BloggerCourse.com  Fact Sheet for Healthcare Providers: SeriousBroker.it  This test is not yet approved or cleared by the [place] FDA and has been authorized for detection and/or diagnosis of SARS-CoV-2 by FDA under an Emergency Use Authorization (EUA). This EUA will remain in effect (meaning this test can be used) for the duration of the COVID-19 declaration under Section 564(b)(1) of the Act, 21 U.S.C. section 360bbb-3(b)(1), unless the authorization is terminated or revoked.  Performed at [facility], [address], [place], [place] [address]   Respiratory (~20 pathogens) panel by PCR     Status: Abnormal   Collection Time: 04/02/22  8:47 AM   Specimen: Nasopharyngeal Swab; Respiratory  Result Value Ref Range Status   Adenovirus NOT DETECTED NOT DETECTED Final   Coronavirus 229E NOT DETECTED NOT DETECTED Final    Comment: (NOTE) The Coronavirus on the Respiratory Panel, DOES NOT test for the novel  Coronavirus (2019 nCoV)    Coronavirus HKU1 NOT DETECTED NOT DETECTED Final   Coronavirus NL63 NOT DETECTED NOT DETECTED Final   Coronavirus OC43 NOT DETECTED NOT DETECTED Final   Metapneumovirus NOT DETECTED NOT DETECTED Final   Rhinovirus / Enterovirus DETECTED (A) NOT DETECTED Final   Influenza A NOT DETECTED NOT DETECTED Final   Influenza B NOT DETECTED NOT DETECTED Final   Parainfluenza Virus 1 NOT DETECTED NOT DETECTED Final   Parainfluenza Virus 2 NOT DETECTED NOT DETECTED Final   Parainfluenza Virus 3 NOT DETECTED NOT DETECTED Final   Parainfluenza  Virus 4 NOT DETECTED NOT DETECTED Final   Respiratory Syncytial Virus NOT DETECTED NOT DETECTED Final   Bordetella pertussis NOT DETECTED NOT DETECTED Final   Bordetella Parapertussis NOT DETECTED NOT DETECTED Final   Chlamydophila pneumoniae NOT DETECTED NOT DETECTED Final   Mycoplasma pneumoniae NOT DETECTED NOT DETECTED Final    Comment: Performed at [facility], [address], [place], [place] [address]    Labs: CBC: Recent Labs  Lab 04/02/22 0444  WBC 13.9*  NEUTROABS 11.3*  HGB 14.0  HCT 42.1  MCV 95.7  PLT 274   Basic Metabolic Panel: Recent Labs  Lab 04/02/22 0444  NA 136  K 3.9  CL 102  CO2 25  GLUCOSE 135*  BUN 13  CREATININE 1.06*  CALCIUM 9.0   Liver Function Tests: No results for input(s): "AST", "ALT", "ALKPHOS", "BILITOT", "PROT", "ALBUMIN" in the last 168 hours. CBG: No results for input(s): "GLUCAP" in the last 168 hours.  Discharge time spent: greater than 30 minutes.  Signed: Catarina Hartshorn, MD [facility] 04/05/2022

## 2022-04-27 DIAGNOSIS — M1711 Unilateral primary osteoarthritis, right knee: Secondary | ICD-10-CM | POA: Diagnosis not present

## 2022-04-30 ENCOUNTER — Other Ambulatory Visit: Payer: Self-pay | Admitting: Surgery

## 2022-04-30 DIAGNOSIS — G8193 Hemiplegia, unspecified affecting right nondominant side: Secondary | ICD-10-CM | POA: Diagnosis not present

## 2022-04-30 DIAGNOSIS — E782 Mixed hyperlipidemia: Secondary | ICD-10-CM | POA: Diagnosis not present

## 2022-04-30 DIAGNOSIS — I25118 Atherosclerotic heart disease of native coronary artery with other forms of angina pectoris: Secondary | ICD-10-CM | POA: Diagnosis not present

## 2022-04-30 DIAGNOSIS — I700 Atherosclerosis of aorta: Secondary | ICD-10-CM | POA: Diagnosis not present

## 2022-04-30 DIAGNOSIS — E538 Deficiency of other specified B group vitamins: Secondary | ICD-10-CM | POA: Diagnosis not present

## 2022-04-30 DIAGNOSIS — R471 Dysarthria and anarthria: Secondary | ICD-10-CM | POA: Diagnosis not present

## 2022-04-30 DIAGNOSIS — N1831 Chronic kidney disease, stage 3a: Secondary | ICD-10-CM | POA: Diagnosis not present

## 2022-04-30 DIAGNOSIS — J449 Chronic obstructive pulmonary disease, unspecified: Secondary | ICD-10-CM | POA: Diagnosis not present

## 2022-04-30 DIAGNOSIS — R202 Paresthesia of skin: Secondary | ICD-10-CM | POA: Diagnosis not present

## 2022-04-30 DIAGNOSIS — I10 Essential (primary) hypertension: Secondary | ICD-10-CM | POA: Diagnosis not present

## 2022-04-30 DIAGNOSIS — J432 Centrilobular emphysema: Secondary | ICD-10-CM | POA: Diagnosis not present

## 2022-05-01 ENCOUNTER — Telehealth: Payer: Self-pay

## 2022-05-01 ENCOUNTER — Other Ambulatory Visit: Payer: Self-pay

## 2022-05-01 ENCOUNTER — Other Ambulatory Visit: Payer: Self-pay | Admitting: Surgery

## 2022-05-01 MED ORDER — CLOPIDOGREL BISULFATE 75 MG PO TABS: 75.0000 mg | ORAL_TABLET | Freq: Every day | ORAL | 1 refills | Status: DC

## 2022-05-01 NOTE — Telephone Encounter (Signed)
Patient called about a surgery clearance.  Date os surgery : 05/12/2022 Surgeon : Dr. Derald Macleod Phone number to office : [phone] Fax number to office : [phone]  Surgery: Knee replacement  Medication needing attention: STOP Plavix days ( ? ) prior to day of surgery.  Please contact patient today after you have faxed clearance.   TODAY IF THE LAST DAY TO FAX OVER CLEARANCE!!

## 2022-05-02 ENCOUNTER — Encounter: Payer: Self-pay | Admitting: Cardiology

## 2022-05-05 ENCOUNTER — Other Ambulatory Visit: Payer: Self-pay

## 2022-05-05 ENCOUNTER — Encounter
Admission: RE | Admit: 2022-05-05 | Discharge: 2022-05-05 | Disposition: A | Discharge status: Home / Self Care | Payer: Medicare Other | Source: Ambulatory Visit | Attending: Surgery | Admitting: Surgery

## 2022-05-05 VITALS — BP 136/88 | HR 86 | Resp 15 | Ht 64.5 in | Wt 215.0 lb

## 2022-05-05 DIAGNOSIS — Z01818 Encounter for other preprocedural examination: Secondary | ICD-10-CM | POA: Diagnosis not present

## 2022-05-05 HISTORY — DX: Other complications of anesthesia, initial encounter: T88.59XA

## 2022-05-05 HISTORY — DX: Cerebral infarction, unspecified: I63.9

## 2022-05-05 LAB — URINALYSIS, ROUTINE W REFLEX MICROSCOPIC
Bacteria, UA: NONE SEEN
Bilirubin Urine: NEGATIVE
Glucose, UA: NEGATIVE mg/dL
Ketones, ur: NEGATIVE mg/dL
Leukocytes,Ua: NEGATIVE
Nitrite: NEGATIVE
Protein, ur: NEGATIVE mg/dL
Specific Gravity, Urine: 1.025 (ref 1.005–1.030)
WBC, UA: NONE SEEN WBC/hpf (ref 0–5)
pH: 5 (ref 5.0–8.0)

## 2022-05-05 LAB — TYPE AND SCREEN
ABO/RH(D): O POS
Antibody Screen: NEGATIVE

## 2022-05-05 LAB — SURGICAL PCR SCREEN
MRSA, PCR: NEGATIVE
Staphylococcus aureus: NEGATIVE

## 2022-05-05 NOTE — Patient Instructions (Signed)
Your procedure is scheduled on: 05/12/22 Report to DAY SURGERY DEPARTMENT LOCATED ON 2ND FLOOR MEDICAL MALL ENTRANCE. To find out your arrival time please call [phone] between 1PM - 3PM on 05/08/22.  Remember: Instructions that are not followed completely may result in serious medical risk, up to and including death, or upon the discretion of your surgeon and anesthesiologist your surgery may need to be rescheduled.     _X__ 1. Do not eat food after midnight the night before your procedure.                 No gum chewing or hard candies. You may drink clear liquids up to 2 hours                 before you are scheduled to arrive for your surgery- DO not drink clear                 liquids within 2 hours of the start of your surgery.                 Clear Liquids include:  water, Ritchey juice without pulp, clear carbohydrate                 drink such as Clearfast or Gatorade, Black Coffee or Tea (Do not add                 anything to coffee or tea). Diabetics water only  Drink the ENSURE pre surgery drink 2 hours prior to arriving to surgery  __X__2.  On the morning of surgery brush your teeth with toothpaste and water, you                 may rinse your mouth with mouthwash if you wish.  Do not swallow any              toothpaste of mouthwash.     _X__ 3.  No Alcohol for 24 hours before or after surgery.   _X__ 4.  Do Not Smoke or use e-cigarettes For 24 Hours Prior to Your Surgery.                 Do not use any chewable tobacco products for at least 6 hours prior to                 surgery.  ____  5.  Bring all medications with you on the day of surgery if instructed.   __X__  6.  Notify your doctor if there is any change in your medical condition      (cold, fever, infections).     Do not wear jewelry, make-up, hairpins, clips or nail polish. Do not wear lotions, powders, or perfumes.  Do not shave body hair 48 hours prior to surgery. Men may shave face and neck. Do not bring  valuables to the hospital.    [facility] is not responsible for any belongings or valuables.  Contacts, dentures/partials or body piercings may not be worn into surgery. Bring a case for your contacts, glasses or hearing aids, a denture cup will be supplied. Leave your suitcase in the car. After surgery it may be brought to your room. For patients admitted to the hospital, discharge time is determined by your treatment team.   Patients discharged the day of surgery will not be allowed to drive home.   Please read over the following fact sheets that you were given:   MRSA Information,  CHG soap, Incentive Spirometer, Ensure  __X__ Take these medicines the morning of surgery with A SIP OF WATER:    1. acebutolol (SECTRAL) 200 MG capsule  2. divalproex (DEPAKOTE ER) 250 MG 24 hr tablet  3. ezetimibe (ZETIA) 10 MG tablet  4. pantoprazole (PROTONIX) 20 MG tablet  5. hydrOXYzine (ATARAX/VISTARIL) 25 MG tablet if needed  6. oxybutynin (DITROPAN) 5 MG tablet if needed  ____ Fleet Enema (as directed)   __X__ Use CHG Soap/SAGE wipes as directed  ____ Use inhalers on the day of surgery  ____ Stop metformin/Janumet/Farxiga 2 days prior to surgery    ____ Take 1/2 of usual insulin dose the night before surgery. No insulin the morning          of surgery.   __X__ Stop Blood Thinners (plavix) 5 DAYS PRIOR TO SURGERY. (LAST DOSE 05/07/22) you may continue your aspirin but do not take it the day of your surgery   __X__ Stop Anti-inflammatories 7 days before surgery such as Advil, Ibuprofen, Motrin,  BC or Goodies Powder, Naprosyn, Naproxen, Aleve  YOU MAY REMAIN ON TYLENOL AS NEEDED   __X__ Stop all herbals and supplements, fish oil or vitamins  for 1 week  until after surgery. (Hold B12 and Vitamin D today 8/29)   ____ Bring C-Pap to the hospital.

## 2022-05-05 NOTE — Progress Notes (Signed)
Patient does not drive. She does have friends and family to assist.

## 2022-05-06 ENCOUNTER — Encounter: Payer: Self-pay | Admitting: Surgery

## 2022-05-06 NOTE — Progress Notes (Signed)
Perioperative Services  Pre-Admission/Anesthesia Testing Clinical Review  Date: 05/08/22  Patient Demographics:  Name: Tonya Boyd DOB:   March 28, 1956 MRN:   [id number]  Planned Surgical Procedure(s):    Case: [id number] Date/Time: 05/12/22 1011   Procedure: TOTAL KNEE ARTHROPLASTY (Right: Knee)   Anesthesia type: Choice   Pre-op diagnosis: PRIMARY OSTEOARTHRITIS OF RIGHT KNEE.   Location: [facility] OR ROOM 03 / [place] [facility]S FOR ANESTHESIA GROUP   Surgeons: Corky Mull, MD   NOTE: Available PAT nursing documentation and vital signs have been reviewed. Clinical nursing staff has updated patient's PMH/PSHx, current medication list, and drug allergies/intolerances to ensure comprehensive history available to assist in medical decision making as it pertains to the aforementioned surgical procedure and anticipated anesthetic course. Extensive review of available clinical information performed. [place] PMH and PSHx updated with any diagnoses/procedures that  may have been inadvertently omitted during her intake with the pre-admission testing department's nursing staff.  Clinical Discussion:  Tonya Boyd is a 66 y.o. female who is submitted for pre-surgical anesthesia review and clearance prior to her undergoing the above procedure. Patient has never been a smoker. Pertinent PMH includes: CAD, NSVT, diastolic dysfunction, angina, stroke like episode, HTN, HLD, CKD, GERD (on daily PPI), COPD, asthma, OA, anxiety, depression.   Patient is followed by cardiology Einar Gip, MD). She was last seen in the cardiology clinic on 12/18/2021; notes reviewed.  At the time of her clinic visit, patient doing well overall from a cardiovascular perspective.  She denied any episodes of chest pain, however continued to have chronic exertional dyspnea.  She denied any PND, orthopnea, palpitations, significant peripheral edema, vertiginous symptoms, or presyncope/syncope.  Patient with rash that she felt was  potentially drug mediated (clopidogrel). Patient with past medical history significant for cardiovascular diagnoses.  Diagnostic RIGHT/LEFT heart catheterization performed on 08/02/2018 revealed a mildly reduced left ventricular systolic function with an EF of 45%.  Patient with a super dominant large RCA.  There was multivessel CAD noted; 80% proximal to mid RCA, 50% ostial RPDA, and 80% D1.  Mean PA pressure 30 mmHg, mean PCWP 8 mmHg.  PA saturation 81%, Ao saturation 99%.  Cardiac output 6.95 L/min and cardiac index 3.65 L/min/m.  PCI was performed placing a 4.0 x 30 mm Orsiro DES x1 to the mid RCA yielding excellent angiographic result and TIMI-3 flow.  Patient with a single 6 beat run of NSVT noted on long-term cardiac event monitor study that was performed on 07/13/2018.  Study revealed a predominant underlying normal sinus rhythm with episodes of sinus tachycardia; maximum rate of 133 bpm.  Patient with ongoing anginal symptoms.  Repeat diagnostic LEFT heart catheterization was performed on 10/07/2018.  There was 50-60% stenosis of the ostial D1 noted.  FFR = 0.96.  There was diffuse high-grade stenosis of the proximal segment of a very small (<1 mm) ramus intermedius.  Further intervention at this time was deferred opting for medical management.  TTE performed on 11/01/2020 revealed a normal left ventricular systolic function with an EF of 55%.  There were no regional wall motion abnormalities. Diastolic Doppler parameters consistent with abnormal relaxation (G1DD).  RVSF normal.  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  There was no evidence of a significant transvalvular gradient to suggest stenosis.  Myocardial perfusion imaging study performed on 03/31/2021 revealed a mildly reduced left ventricular systolic function with an EF of 41%.  There was mild inferoseptal and severe inferolateral hypokinesis.  There was a moderate fixed perfusion defect  of the lateral and inferior  regions consistent with scar.  Additionally, there was a reversible moderate defect in the septal and apical regions consistent with ischemia.  ECG was nondiagnostic due to pharmacologic stress.  Study determined to be abnormal and high risk.  Cardiac catheterization was recommended.  Again anginal symptoms persisted necessitating repeat diagnostic LEFT heart catheterization on 04/08/2021.  Study revealed an 80% proximal RCA and 80% ostial PDA stenosis.  PCI was performed placing a 4.0 x 16 mm Synergy XD DES x1 to the proximal RCA and a 2.5 x 12 mm Synergy XD DES x1 to the ostial PDA lesions.  Intervention yielded excellent angiographic result and TIMI-3 flow.  Patient experienced a "stroke like episode" on 06/08/2019.  She presented with speech difficulties and RIGHT-sided weakness and paresthesias.  MRI of the brain x 2 were negative for CVA.  Etiology of neurovascular symptoms unclear, however possible conversion disorder episode being considered.  Following stent placement, patient remains on daily DAPT therapy (ASA + clopidogrel).  Patient reportedly compliant with therapy with no evidence or reports of GI bleeding.  Blood pressure elevated at 151/85 mmHg on prescribed beta-blocker (acebutolol) monotherapy.  Patient has a supply of short acting nitrates (NTG) to use on a as needed basis for recurrent anginal symptoms; denied use since last visit.  She is on a PCSK9i (evolocumab) plus ezetimibe for her HLD diagnosis and further ASCVD prevention.  She is not diabetic.  Patient does not have an OSAH diagnosis.  Functional capacity somewhat limited by exertional dyspnea, however patient still felt to be able to achieve at least 4 METS of activity without experiencing any angina/anginal equivalent symptoms.  No changes were made to her medication regimen.  Patient to follow-up with outpatient cardiology in 6 months or sooner if needed.  Tonya Boyd is scheduled for an elective RIGHT TOTAL KNEE  ARTHROPLASTY on 05/12/2022 with Dr. Milagros Evener, MD. Given patient's past medical history significant for cardiovascular diagnoses, presurgical cardiac clearance was sought by the PAT team. Per cardiology, "this patient is optimized for surgery and may proceed with the planned procedural course with a LOW risk of significant perioperative cardiovascular complications". Again, this patient is on daily DAPT therapy. She has been instructed on recommendations for holding her clopidogrel dose for 5 days with plans to restart as soon as postoperative bleeding risk felt to be minimized by her prior attending surgeon. She is aware that her last dose of clopidogrel should be on 05/06/2022. She will continue her daily low dose ASA throughout her perioperative course.   Patient reports previous perioperative complications with anesthesia in the past. She reports (+) postoperative nausea only (no vomiting). She also reports (+) "shaking" that is relieved with intravenous diphenhydramine. In review of the available records, it is noted that patient underwent a general anesthetic course at [facility] (ASA II) in 06/2012 without documented complications.      05/05/2022   10:18 AM 04/05/2022    5:41 AM 04/04/2022    9:22 PM  Vitals with BMI  Height 5' 4.5"    Weight 215 lbs    BMI [id number]    Systolic [id number]  Diastolic 88 80 79  Pulse 86 77 79    Providers/Specialists:   NOTE: Primary physician provider listed below. Patient may have been seen by APP or partner within same practice.   PROVIDER ROLE / SPECIALTY LAST OV  Poggi, Marshall Cork, MD Orthopedics (Surgeon) 04/27/2022  Merrilee Seashore,  MD Primary Care Provider 04/30/2022  [name], MD Cardiology 12/18/2021  Merri Ray, MD Physiatry 10/31/2021   Allergies:  Adhesive [tape], Brilinta [ticagrelor], Statins, Carvedilol, Diovan [valsartan], Hydrochlorothiazide, Ibuprofen, Keflex [cephalexin], Morphine and  related, Norvasc [amlodipine besylate], Nsaids, Oxycodone, Prednisone, Reglan [metoclopramide], Singulair [montelukast], and Temazepam  Current Home Medications:   No current facility-administered medications for this encounter.    acebutolol (SECTRAL) 200 MG capsule   acetaminophen (TYLENOL) 500 MG tablet   albuterol (VENTOLIN HFA) 108 (90 Base) MCG/ACT inhaler   aspirin EC 81 MG tablet   clopidogrel (PLAVIX) 75 MG tablet   cyanocobalamin (,VITAMIN B-12,) 1000 MCG/ML injection   cyclobenzaprine (FLEXERIL) 10 MG tablet   divalproex (DEPAKOTE ER) 250 MG 24 hr tablet   Evolocumab (REPATHA SURECLICK) 140 MG/ML SOAJ   ezetimibe (ZETIA) 10 MG tablet   hydrOXYzine (ATARAX/VISTARIL) 25 MG tablet   ipratropium-albuterol (DUONEB) 0.5-2.5 (3) MG/3ML SOLN   loratadine (CLARITIN) 10 MG tablet   nitroGLYCERIN (NITROSTAT) 0.4 MG SL tablet   oxybutynin (DITROPAN) 5 MG tablet   pantoprazole (PROTONIX) 20 MG tablet   predniSONE (DELTASONE) 50 MG tablet   traZODone (DESYREL) 50 MG tablet   Vitamin D, Ergocalciferol, (DRISDOL) 1.25 MG (50000 UT) CAPS capsule   History:   Past Medical History:  Diagnosis Date   Angina pectoris (HCC)    Anxiety    Asthma    CAD (coronary artery disease), native coronary artery 08/02/2018   a.) [facility] 08/02/2018: EF 45%, super-dominant large RCA, 80% p-mRCA, 50% oRPDA, 80% D1; mPA 13, mPCWP 8, PA sat 81%, Ao sat 99, CO 6.95, CI 3.65 --> 4.0 x 30 mm Orsiro DES x1 to [facility]; b.) [facility] 10/07/2018: 50-60% oD1 (FFR 0.96), diffuse HG stenosis prox seg very small (<1 mm) RI --> med mgmt; c.) [facility] 04/08/2021: 80% pRCA (4.0 x 16 mm Synergy XD DES) and 80% oPDA (2.5 x 12 mm Synergy XD DES)   Chronic bronchitis (HCC)    Chronic kidney disease    Complication of anesthesia    a.) postoperative nausea only (no vomiting); also "shakes"; per patient relieved w/ IV diphenhydramine   COPD (chronic obstructive pulmonary disease) (HCC)    Depression    Diastolic dysfunction 07/23/2018    a.) TTE 07/23/2018: EF 55-60%, GLS -19.6%, G1DD; b.) TTE 11/01/2020: EF 55%, triv AR, G1DD   Family history of adverse reaction to anesthesia    "cousin stopped breathing; he was allergic to the anesthesia" (08/02/2018)   GERD (gastroesophageal reflux disease)    High cholesterol    History of gout    History of hiatal hernia    History of kidney stones    Hypertension 07/2018   Interstitial cystitis    Long term current use of antithrombotics/antiplatelets    a.) daily DAPT therapy (ASA + clopidogrel)   Migraines    NSVT (nonsustained ventricular tachycardia) (HCC) 07/2018   a.) holter 07/13/2018: 6 beat run NSVT; maximum rate 133 bpm.   OA (osteoarthritis)    Raynaud's disease    Stroke-like episode 06/08/2019   a.) speech difficulty, right sided weakness/paraesthesias; unclear etiology ?? possibly conversion disorder. MRI brain x 2 negative for stroke.   Past Surgical History:  Procedure Laterality Date   ABDOMINAL HYSTERECTOMY     ANTERIOR CERVICAL DECOMP/DISCECTOMY FUSION     APPENDECTOMY     AUGMENTATION MAMMAPLASTY Bilateral    BACK SURGERY     CORONARY ANGIOPLASTY WITH STENT PLACEMENT  08/02/2018   CORONARY STENT INTERVENTION N/A 08/02/2018   Procedure:  CORONARY STENT INTERVENTION;  Surgeon: Adrian Prows, MD;  Location: [place] [facility];  Service: Cardiovascular;  Laterality: N/A;  RCA    CORONARY STENT INTERVENTION N/A 04/08/2021   Procedure: CORONARY STENT INTERVENTION;  Surgeon: Adrian Prows, MD;  Location: [place] [facility];  Service: Cardiovascular;  Laterality: N/A;  RCA and PDA   CYSTOSCOPY W/ STONE MANIPULATION     INTRAVASCULAR PRESSURE WIRE/FFR STUDY N/A 10/07/2018   Procedure: INTRAVASCULAR PRESSURE WIRE/FFR STUDY;  Surgeon: Adrian Prows, MD;  Location: [place] [facility];  Service: Cardiovascular;  Laterality: N/A;   KNEE ARTHROSCOPY Right    LAPAROSCOPIC CHOLECYSTECTOMY     LEFT HEART CATH AND CORONARY ANGIOGRAPHY N/A 10/07/2018   Procedure: LEFT HEART CATH AND  CORONARY ANGIOGRAPHY;  Surgeon: Adrian Prows, MD;  Location: [place] [facility];  Service: Cardiovascular;  Laterality: N/A;   LEFT HEART CATH AND CORONARY ANGIOGRAPHY N/A 04/08/2021   Procedure: LEFT HEART CATH AND CORONARY ANGIOGRAPHY;  Surgeon: Adrian Prows, MD;  Location: [place] [facility];  Service: Cardiovascular;  Laterality: N/A;   REPAIR ANKLE LIGAMENT Left    "tied up ligament; had tore up qthing in my ankle"   RIGHT/LEFT HEART CATH AND CORONARY ANGIOGRAPHY N/A 08/02/2018   Procedure: RIGHT/LEFT HEART CATH AND CORONARY ANGIOGRAPHY;  Surgeon: Adrian Prows, MD;  Location: [place] [facility];  Service: Cardiovascular;  Laterality: N/A;   SHOULDER SURGERY Bilateral    TONSILLECTOMY     TUBAL LIGATION     Family History  Problem Relation Age of Onset   Kidney failure Mother    Diabetes Mother    Heart disease Father    Heart attack Father    Colon cancer Brother    Liver cancer Brother    Heart disease Other    Arthritis Other    Cancer Other    Asthma Other    Diabetes Other    Kidney disease Other    Social History   Tobacco Use   Smoking status: Never   Smokeless tobacco: Never  Vaping Use   Vaping Use: Never used  Substance Use Topics   Alcohol use: Not Currently    Comment: 08/02/2018 "not since my 20's"   Drug use: Never    Pertinent Clinical Results:  LABS: Labs reviewed: Acceptable for surgery.  Hospital Outpatient Visit on 05/05/2022  Component Date Value Ref Range Status   Color, Urine 05/05/2022 YELLOW (A)  YELLOW Final   APPearance 05/05/2022 HAZY (A)  CLEAR Final   Specific Gravity, Urine 05/05/2022 1.025  1.005 - 1.030 Final   pH 05/05/2022 5.0  5.0 - 8.0 Final   Glucose, UA 05/05/2022 NEGATIVE  NEGATIVE mg/dL Final   Hgb urine dipstick 05/05/2022 SMALL (A)  NEGATIVE Final   Bilirubin Urine 05/05/2022 NEGATIVE  NEGATIVE Final   Ketones, ur 05/05/2022 NEGATIVE  NEGATIVE mg/dL Final   Protein, ur 05/05/2022 NEGATIVE  NEGATIVE mg/dL Final   Nitrite  05/05/2022 NEGATIVE  NEGATIVE Final   Leukocytes,Ua 05/05/2022 NEGATIVE  NEGATIVE Final   RBC / HPF 05/05/2022 0-5  0 - 5 RBC/hpf Final   WBC, UA 05/05/2022 NONE SEEN  0 - 5 WBC/hpf Final   Bacteria, UA 05/05/2022 NONE SEEN  NONE SEEN Final   Squamous Epithelial / LPF 05/05/2022 0-5  0 - 5 Final   Mucus 05/05/2022 PRESENT   Final   Performed at [facility], [address] [place], [place] [address]   ABO/RH(D) 05/05/2022 O POS   Final   Antibody  Screen 05/05/2022 NEG   Final   Sample Expiration 05/05/2022 05/19/2022,2359   Final   Extend sample reason 05/05/2022    Final                   Value:NO TRANSFUSIONS OR PREGNANCY IN THE PAST 3 MONTHS Performed at [facility], [address] [place], [place] [address]    MRSA, PCR 05/05/2022 NEGATIVE  NEGATIVE Final   Staphylococcus aureus 05/05/2022 NEGATIVE  NEGATIVE Final   Comment: (NOTE) The Xpert SA Assay (FDA approved for NASAL specimens in patients 48 years of age and older), is one component of a comprehensive surveillance program. It is not intended to diagnose infection nor to guide or monitor treatment. Performed at [facility], [address] [place], [place] [address]   WBC 04/02/2022 13.9 (H)  4.0 - 10.5 K/uL Final   RBC 04/02/2022 4.40  3.87 - 5.11 MIL/uL Final   Hemoglobin 04/02/2022 14.0  12.0 - 15.0 g/dL Final   HCT 86/57/8469 42.1  36.0 - 46.0 % Final   MCV 04/02/2022 95.7  80.0 - 100.0 fL Final   MCH 04/02/2022 31.8  26.0 - 34.0 pg Final   MCHC 04/02/2022 33.3  30.0 - 36.0 g/dL Final   RDW 62/95/2841 12.6  11.5 - 15.5 % Final   Platelets 04/02/2022 274  150 - 400 K/uL Final   nRBC 04/02/2022 0.0  0.0 - 0.2 % Final   Neutrophils Relative % 04/02/2022 81  % Final   Neutro Abs 04/02/2022 11.3 (H)  1.7 - 7.7 K/uL Final   Lymphocytes Relative 04/02/2022 9  % Final   Lymphs Abs 04/02/2022 1.2  0.7 - 4.0 K/uL Final   Monocytes Relative 04/02/2022 8  % Final   Monocytes Absolute 04/02/2022  1.0  0.1 - 1.0 K/uL Final   Eosinophils Relative 04/02/2022 2  % Final   Eosinophils Absolute 04/02/2022 0.2  0.0 - 0.5 K/uL Final   Basophils Relative 04/02/2022 0  % Final   Basophils Absolute 04/02/2022 0.1  0.0 - 0.1 K/uL Final   Immature Granulocytes 04/02/2022 0  % Final   Abs Immature Granulocytes 04/02/2022 0.05  0.00 - 0.07 K/uL Final   Performed at [facility], [address] [place], [place] [address]   Sodium 04/02/2022 136  135 - 145 mmol/L Final   Potassium 04/02/2022 3.9  3.5 - 5.1 mmol/L Final   Chloride 04/02/2022 102  98 - 111 mmol/L Final   CO2 04/02/2022 25  22 - 32 mmol/L Final   Glucose, Bld 04/02/2022 135 (H)  70 - 99 mg/dL Final   Glucose reference range applies only to samples taken after fasting for at least 8 hours.   BUN 04/02/2022 13  8 - 23 mg/dL Final   Creatinine, Ser 04/02/2022 1.06 (H)  0.44 - 1.00 mg/dL Final   Calcium 07/04/2535 9.0  8.9 - 10.3 mg/dL Final   GFR, Estimated 04/02/2022 58 (L)  >60 mL/min Final   Comment: (NOTE) Calculated using the CKD-EPI Creatinine Equation (2021)    Anion gap 04/02/2022 9  5 - 15 Final   Performed at [facility], [address] [place], [place] [address]    ECG: Date: 04/02/2022 Time ECG obtained: 0417 AM Rate: 98 bpm Rhythm:  Sinus rhythm Axis (leads I and aVF): Left axis deviation Intervals: PR 130 ms. QRS 96 ms. QTc 472 ms. ST segment and T wave changes: Minimal anterolateral ST depression.   Comparison: Similar to previous tracing  obtained on 12/18/2021.   IMAGING / PROCEDURES: LEFT HEART CATHETERIZATION AND CORONARY ANGIOGRAPHY performed on 04/08/2021 Normal left ventricular systolic function LVEDP 14 mmHg Obstructive CAD 80% proximal RCA 80% ostial PDA No ISR of previously placed mid RCA stent Successful PCI yielding excellent angiographic result and TIMI-3 flow 4.0 x 16 mm Synergy XD DES x1 to the proximal RCA 2.5 x 12 mm Synergy XD DES to the ostial PDA Recommendations Continue aggressive  risk factor modification   MYOCARDIAL PERFUSION IMAGING STUDY (LEXISCAN) performed on 03/31/2021 Mildly reduced left ventricular systolic function with an LVEF of 41% Mild inferoseptal and severe inferolateral hypokinesis  The left ventricular cavity size is normal Moderate fixed lateral and inferior perfusion defect consistent with scar Moderate reversible septal and apical perfusion defect consistent with ischemia ECG was not diagnostic due to pharmacological stress Study determined to be high risk  TRANSTHORACIC ECHOCARDIOGRAM performed on 11/01/2020 Normal left ventricular systolic function with an EF of [id number] Diastolic Doppler parameters consistent with abnormal relaxation (G1DD). Normal right ventricular systolic function Trace aortic and mild mitral valve regurgitation Normal transvalvular gradients; no valvular stenosis No pericardial effusion  Impression and Plan:  Tonya Boyd has been referred for pre-anesthesia review and clearance prior to her undergoing the planned anesthetic and procedural courses. Available labs, pertinent testing, and imaging results were personally reviewed by me. This patient has been appropriately cleared by cardiology with an overall LOW risk of significant perioperative cardiovascular complications.  Based on clinical review performed today (05/08/22), barring any significant acute changes in the patient's overall condition, it is anticipated that she will be able to proceed with the planned surgical intervention. Any acute changes in clinical condition may necessitate her procedure being postponed and/or cancelled. Patient will meet with anesthesia team (MD and/or CRNA) on the day of her procedure for preoperative evaluation/assessment. Questions regarding anesthetic course will be fielded at that time.   Pre-surgical instructions were reviewed with the patient during her PAT appointment and questions were fielded by PAT clinical staff. Patient was  advised that if any questions or concerns arise prior to her procedure then she should return a call to PAT and/or her surgeon's office to discuss.  Honor Loh, MSN, APRN, FNP-C, CEN [facility]  Peri-operative Services Nurse Practitioner Phone: [phone] Fax: [phone] 05/08/22 11:00 AM  NOTE: This note has been prepared using Dragon dictation software. Despite my best ability to proofread, there is always the potential that unintentional transcriptional errors may still occur from this process.

## 2022-05-08 ENCOUNTER — Encounter: Payer: Self-pay | Admitting: Surgery

## 2022-05-12 ENCOUNTER — Ambulatory Visit: Payer: Medicare Other | Admitting: Urgent Care

## 2022-05-12 ENCOUNTER — Ambulatory Visit: Payer: Medicare Other

## 2022-05-12 ENCOUNTER — Observation Stay
Admission: RE | Admit: 2022-05-12 | Discharge: 2022-05-13 | Disposition: A | Discharge status: Home Health | Payer: Medicare Other | Attending: Surgery | Admitting: Surgery

## 2022-05-12 ENCOUNTER — Encounter: Payer: Self-pay | Admitting: Surgery

## 2022-05-12 ENCOUNTER — Other Ambulatory Visit: Payer: Self-pay

## 2022-05-12 ENCOUNTER — Encounter
Admission: RE | Disposition: A | Discharge status: Home Health | Payer: Self-pay | Source: Home / Self Care | Attending: Surgery

## 2022-05-12 DIAGNOSIS — J449 Chronic obstructive pulmonary disease, unspecified: Secondary | ICD-10-CM | POA: Insufficient documentation

## 2022-05-12 DIAGNOSIS — J4552 Severe persistent asthma with status asthmaticus: Secondary | ICD-10-CM | POA: Diagnosis not present

## 2022-05-12 DIAGNOSIS — Z8673 Personal history of transient ischemic attack (TIA), and cerebral infarction without residual deficits: Secondary | ICD-10-CM | POA: Insufficient documentation

## 2022-05-12 DIAGNOSIS — Z79899 Other long term (current) drug therapy: Secondary | ICD-10-CM | POA: Diagnosis not present

## 2022-05-12 DIAGNOSIS — J45909 Unspecified asthma, uncomplicated: Secondary | ICD-10-CM | POA: Diagnosis not present

## 2022-05-12 DIAGNOSIS — Z471 Aftercare following joint replacement surgery: Secondary | ICD-10-CM | POA: Diagnosis not present

## 2022-05-12 DIAGNOSIS — N189 Chronic kidney disease, unspecified: Secondary | ICD-10-CM | POA: Insufficient documentation

## 2022-05-12 DIAGNOSIS — I25119 Atherosclerotic heart disease of native coronary artery with unspecified angina pectoris: Secondary | ICD-10-CM | POA: Insufficient documentation

## 2022-05-12 DIAGNOSIS — I129 Hypertensive chronic kidney disease with stage 1 through stage 4 chronic kidney disease, or unspecified chronic kidney disease: Secondary | ICD-10-CM | POA: Insufficient documentation

## 2022-05-12 DIAGNOSIS — Z955 Presence of coronary angioplasty implant and graft: Secondary | ICD-10-CM | POA: Insufficient documentation

## 2022-05-12 DIAGNOSIS — J45901 Unspecified asthma with (acute) exacerbation: Secondary | ICD-10-CM | POA: Diagnosis not present

## 2022-05-12 DIAGNOSIS — M25461 Effusion, right knee: Secondary | ICD-10-CM | POA: Diagnosis not present

## 2022-05-12 DIAGNOSIS — J9601 Acute respiratory failure with hypoxia: Secondary | ICD-10-CM | POA: Diagnosis not present

## 2022-05-12 DIAGNOSIS — M1711 Unilateral primary osteoarthritis, right knee: Secondary | ICD-10-CM | POA: Diagnosis not present

## 2022-05-12 DIAGNOSIS — I2510 Atherosclerotic heart disease of native coronary artery without angina pectoris: Secondary | ICD-10-CM | POA: Diagnosis not present

## 2022-05-12 DIAGNOSIS — I10 Essential (primary) hypertension: Secondary | ICD-10-CM | POA: Diagnosis not present

## 2022-05-12 DIAGNOSIS — Z7902 Long term (current) use of antithrombotics/antiplatelets: Secondary | ICD-10-CM | POA: Insufficient documentation

## 2022-05-12 DIAGNOSIS — Z96651 Presence of right artificial knee joint: Secondary | ICD-10-CM | POA: Diagnosis not present

## 2022-05-12 HISTORY — DX: Chronic obstructive pulmonary disease, unspecified: J44.9

## 2022-05-12 HISTORY — DX: Unspecified osteoarthritis, unspecified site: M19.90

## 2022-05-12 HISTORY — DX: Depression, unspecified: F32.A

## 2022-05-12 HISTORY — DX: Anxiety disorder, unspecified: F41.9

## 2022-05-12 HISTORY — DX: Long term (current) use of antithrombotics/antiplatelets: Z79.02

## 2022-05-12 HISTORY — PX: TOTAL KNEE ARTHROPLASTY: SHX125

## 2022-05-12 HISTORY — DX: Migraine, unspecified, not intractable, without status migrainosus: G43.909

## 2022-05-12 LAB — ABO/RH: ABO/RH(D): O POS

## 2022-05-12 SURGERY — ARTHROPLASTY, KNEE, TOTAL
Anesthesia: General | Site: Knee | Laterality: Right

## 2022-05-12 MED ORDER — CHLORHEXIDINE GLUCONATE 0.12 % MT SOLN: 15.0000 mL | Freq: Once | OROMUCOSAL | Status: AC

## 2022-05-12 MED ORDER — ONDANSETRON HCL 4 MG PO TABS: 4.0000 mg | ORAL_TABLET | Freq: Four times a day (QID) | ORAL | Status: DC | PRN

## 2022-05-12 MED ORDER — ONDANSETRON HCL 4 MG/2ML IJ SOLN
INTRAMUSCULAR | Status: DC | PRN
  Administered 2022-05-12: 4 mg via INTRAVENOUS

## 2022-05-12 MED ORDER — ACETAMINOPHEN 500 MG PO TABS
1000.0000 mg | ORAL_TABLET | Freq: Four times a day (QID) | ORAL | Status: DC
  Administered 2022-05-12 – 2022-05-13 (×3): 1000 mg via ORAL
  Filled 2022-05-12 (×3): qty 2

## 2022-05-12 MED ORDER — METOCLOPRAMIDE HCL 5 MG/ML IJ SOLN: 5.0000 mg | Freq: Three times a day (TID) | INTRAMUSCULAR | Status: DC | PRN

## 2022-05-12 MED ORDER — NITROGLYCERIN 0.4 MG SL SUBL: 0.4000 mg | SUBLINGUAL_TABLET | SUBLINGUAL | Status: DC | PRN

## 2022-05-12 MED ORDER — VITAMIN D (ERGOCALCIFEROL) 1.25 MG (50000 UNIT) PO CAPS
50000.0000 [IU] | ORAL_CAPSULE | ORAL | Status: DC
  Filled 2022-05-12: qty 1

## 2022-05-12 MED ORDER — ESMOLOL HCL 100 MG/10ML IV SOLN
INTRAVENOUS | Status: DC | PRN
  Administered 2022-05-12 (×2): 20 mg via INTRAVENOUS

## 2022-05-12 MED ORDER — SODIUM CHLORIDE 0.9 % IV SOLN: INTRAVENOUS | Status: DC

## 2022-05-12 MED ORDER — TRANEXAMIC ACID 1000 MG/10ML IV SOLN
INTRAVENOUS | Status: AC
  Filled 2022-05-12: qty 10

## 2022-05-12 MED ORDER — ORAL CARE MOUTH RINSE: 15.0000 mL | Freq: Once | OROMUCOSAL | Status: AC

## 2022-05-12 MED ORDER — FENTANYL CITRATE (PF) 100 MCG/2ML IJ SOLN
INTRAMUSCULAR | Status: AC
  Administered 2022-05-12: 25 ug via INTRAVENOUS
  Filled 2022-05-12: qty 2

## 2022-05-12 MED ORDER — LABETALOL HCL 5 MG/ML IV SOLN
INTRAVENOUS | Status: DC | PRN
  Administered 2022-05-12 (×2): 5 mg via INTRAVENOUS

## 2022-05-12 MED ORDER — PROPOFOL 10 MG/ML IV BOLUS
INTRAVENOUS | Status: AC
  Filled 2022-05-12: qty 20

## 2022-05-12 MED ORDER — TRIAMCINOLONE ACETONIDE 40 MG/ML IJ SUSP
INTRAMUSCULAR | Status: AC
Start: 2022-05-12 — End: ?
  Filled 2022-05-12: qty 2

## 2022-05-12 MED ORDER — APIXABAN 2.5 MG PO TABS
2.5000 mg | ORAL_TABLET | Freq: Two times a day (BID) | ORAL | Status: DC
  Administered 2022-05-13: 2.5 mg via ORAL
  Filled 2022-05-12 (×2): qty 1

## 2022-05-12 MED ORDER — LORATADINE 10 MG PO TABS
10.0000 mg | ORAL_TABLET | Freq: Every day | ORAL | Status: DC
  Administered 2022-05-12: 10 mg via ORAL
  Filled 2022-05-12: qty 1

## 2022-05-12 MED ORDER — MIDAZOLAM HCL 2 MG/2ML IJ SOLN
INTRAMUSCULAR | Status: AC
  Filled 2022-05-12: qty 2

## 2022-05-12 MED ORDER — METOCLOPRAMIDE HCL 10 MG PO TABS: 5.0000 mg | ORAL_TABLET | Freq: Three times a day (TID) | ORAL | Status: DC | PRN

## 2022-05-12 MED ORDER — METOPROLOL TARTRATE 5 MG/5ML IV SOLN
INTRAVENOUS | Status: AC
  Filled 2022-05-12: qty 5

## 2022-05-12 MED ORDER — ACETAMINOPHEN 325 MG PO TABS: 325.0000 mg | ORAL_TABLET | Freq: Four times a day (QID) | ORAL | Status: DC | PRN

## 2022-05-12 MED ORDER — DEXMEDETOMIDINE HCL IN NACL 200 MCG/50ML IV SOLN
INTRAVENOUS | Status: DC | PRN
  Administered 2022-05-12 (×2): 8 ug via INTRAVENOUS
  Administered 2022-05-12: 4 ug via INTRAVENOUS

## 2022-05-12 MED ORDER — MAGNESIUM HYDROXIDE 400 MG/5ML PO SUSP
30.0000 mL | Freq: Every day | ORAL | Status: DC | PRN
  Filled 2022-05-12: qty 30

## 2022-05-12 MED ORDER — OXYCODONE HCL 5 MG PO TABS
5.0000 mg | ORAL_TABLET | Freq: Once | ORAL | Status: AC | PRN
  Administered 2022-05-12: 5 mg via ORAL

## 2022-05-12 MED ORDER — SUGAMMADEX SODIUM 200 MG/2ML IV SOLN
INTRAVENOUS | Status: DC | PRN
  Administered 2022-05-12: 200 mg via INTRAVENOUS

## 2022-05-12 MED ORDER — DEXAMETHASONE SODIUM PHOSPHATE 10 MG/ML IJ SOLN
INTRAMUSCULAR | Status: DC | PRN
  Administered 2022-05-12: 10 mg via INTRAVENOUS

## 2022-05-12 MED ORDER — METOPROLOL TARTRATE 5 MG/5ML IV SOLN
INTRAVENOUS | Status: DC | PRN
  Administered 2022-05-12 (×2): 2 mg via INTRAVENOUS
  Administered 2022-05-12: 1 mg via INTRAVENOUS

## 2022-05-12 MED ORDER — OXYCODONE HCL 5 MG PO TABS
5.0000 mg | ORAL_TABLET | ORAL | Status: DC | PRN
  Administered 2022-05-12: 5 mg via ORAL
  Filled 2022-05-12: qty 2

## 2022-05-12 MED ORDER — DEXAMETHASONE SODIUM PHOSPHATE 10 MG/ML IJ SOLN
INTRAMUSCULAR | Status: AC
Start: 2022-05-12 — End: ?
  Filled 2022-05-12: qty 1

## 2022-05-12 MED ORDER — DOCUSATE SODIUM 100 MG PO CAPS
100.0000 mg | ORAL_CAPSULE | Freq: Two times a day (BID) | ORAL | Status: DC
  Administered 2022-05-12: 100 mg via ORAL
  Filled 2022-05-12 (×2): qty 1

## 2022-05-12 MED ORDER — FENTANYL CITRATE (PF) 100 MCG/2ML IJ SOLN
25.0000 ug | INTRAMUSCULAR | Status: DC | PRN
  Administered 2022-05-12: 25 ug via INTRAVENOUS

## 2022-05-12 MED ORDER — FENTANYL CITRATE (PF) 100 MCG/2ML IJ SOLN
INTRAMUSCULAR | Status: DC | PRN
Start: 2022-05-12 — End: 2022-05-12
  Administered 2022-05-12 (×2): 50 ug via INTRAVENOUS

## 2022-05-12 MED ORDER — BUPIVACAINE LIPOSOME 1.3 % IJ SUSP
INTRAMUSCULAR | Status: AC
  Filled 2022-05-12: qty 20

## 2022-05-12 MED ORDER — VANCOMYCIN HCL IN DEXTROSE 1-5 GM/200ML-% IV SOLN: 1000.0000 mg | INTRAVENOUS | Status: AC

## 2022-05-12 MED ORDER — DROPERIDOL 2.5 MG/ML IJ SOLN
0.6250 mg | Freq: Once | INTRAMUSCULAR | Status: AC | PRN
Start: 2022-05-12 — End: 2022-05-12

## 2022-05-12 MED ORDER — ACETAMINOPHEN 10 MG/ML IV SOLN
INTRAVENOUS | Status: DC | PRN
  Administered 2022-05-12: 1000 mg via INTRAVENOUS

## 2022-05-12 MED ORDER — OXYCODONE HCL 5 MG PO TABS
ORAL_TABLET | ORAL | Status: AC
  Administered 2022-05-12: 5 mg via ORAL
  Filled 2022-05-12: qty 1

## 2022-05-12 MED ORDER — CHLORHEXIDINE GLUCONATE 0.12 % MT SOLN
OROMUCOSAL | Status: AC
  Administered 2022-05-12: 15 mL via OROMUCOSAL
  Filled 2022-05-12: qty 15

## 2022-05-12 MED ORDER — PHENYLEPHRINE 80 MCG/ML (10ML) SYRINGE FOR IV PUSH (FOR BLOOD PRESSURE SUPPORT)
PREFILLED_SYRINGE | INTRAVENOUS | Status: AC
Start: 2022-05-12 — End: ?
  Filled 2022-05-12: qty 10

## 2022-05-12 MED ORDER — KETAMINE HCL 10 MG/ML IJ SOLN
INTRAMUSCULAR | Status: DC | PRN
  Administered 2022-05-12: 10 mg via INTRAVENOUS
  Administered 2022-05-12: 20 mg via INTRAVENOUS

## 2022-05-12 MED ORDER — ONDANSETRON HCL 4 MG/2ML IJ SOLN
INTRAMUSCULAR | Status: AC
  Filled 2022-05-12: qty 2

## 2022-05-12 MED ORDER — FLEET ENEMA 7-19 GM/118ML RE ENEM: 1.0000 | ENEMA | Freq: Once | RECTAL | Status: DC | PRN

## 2022-05-12 MED ORDER — OXYCODONE HCL 5 MG/5ML PO SOLN: 5.0000 mg | Freq: Once | ORAL | Status: AC | PRN

## 2022-05-12 MED ORDER — ACEBUTOLOL HCL 200 MG PO CAPS: 200.0000 mg | ORAL_CAPSULE | Freq: Two times a day (BID) | ORAL | Status: DC

## 2022-05-12 MED ORDER — TRAZODONE HCL 100 MG PO TABS
100.0000 mg | ORAL_TABLET | Freq: Two times a day (BID) | ORAL | Status: DC | PRN
  Administered 2022-05-12: 100 mg via ORAL
  Filled 2022-05-12: qty 1

## 2022-05-12 MED ORDER — PANTOPRAZOLE SODIUM 20 MG PO TBEC
20.0000 mg | DELAYED_RELEASE_TABLET | Freq: Every day | ORAL | Status: DC
  Administered 2022-05-13: 20 mg via ORAL
  Filled 2022-05-12: qty 1

## 2022-05-12 MED ORDER — HYDROXYZINE HCL 25 MG PO TABS: 25.0000 mg | ORAL_TABLET | Freq: Four times a day (QID) | ORAL | Status: DC | PRN

## 2022-05-12 MED ORDER — ROCURONIUM BROMIDE 100 MG/10ML IV SOLN
INTRAVENOUS | Status: DC | PRN
  Administered 2022-05-12: 10 mg via INTRAVENOUS
  Administered 2022-05-12: 50 mg via INTRAVENOUS
  Administered 2022-05-12: 30 mg via INTRAVENOUS

## 2022-05-12 MED ORDER — LIDOCAINE HCL (CARDIAC) PF 100 MG/5ML IV SOSY
PREFILLED_SYRINGE | INTRAVENOUS | Status: DC | PRN
  Administered 2022-05-12: 100 mg via INTRAVENOUS

## 2022-05-12 MED ORDER — DIPHENHYDRAMINE HCL 50 MG/ML IJ SOLN
INTRAMUSCULAR | Status: AC
  Filled 2022-05-12: qty 1

## 2022-05-12 MED ORDER — FENTANYL CITRATE (PF) 100 MCG/2ML IJ SOLN
INTRAMUSCULAR | Status: AC
  Filled 2022-05-12: qty 2

## 2022-05-12 MED ORDER — APIXABAN 2.5 MG PO TABS: 2.5000 mg | ORAL_TABLET | Freq: Two times a day (BID) | ORAL | 0 refills | Status: DC

## 2022-05-12 MED ORDER — 0.9 % SODIUM CHLORIDE (POUR BTL) OPTIME
TOPICAL | Status: DC | PRN
  Administered 2022-05-12: 500 mL

## 2022-05-12 MED ORDER — ACETAMINOPHEN 10 MG/ML IV SOLN
INTRAVENOUS | Status: AC
  Filled 2022-05-12: qty 100

## 2022-05-12 MED ORDER — DIVALPROEX SODIUM ER 250 MG PO TB24
250.0000 mg | ORAL_TABLET | Freq: Every day | ORAL | Status: DC
  Administered 2022-05-12 – 2022-05-13 (×2): 250 mg via ORAL
  Filled 2022-05-12 (×2): qty 1

## 2022-05-12 MED ORDER — LACTATED RINGERS IV SOLN: INTRAVENOUS | Status: DC

## 2022-05-12 MED ORDER — DIPHENHYDRAMINE HCL 12.5 MG/5ML PO ELIX: 12.5000 mg | ORAL_SOLUTION | ORAL | Status: DC | PRN

## 2022-05-12 MED ORDER — HYDROMORPHONE HCL 1 MG/ML IJ SOLN
INTRAMUSCULAR | Status: AC
  Filled 2022-05-12: qty 1

## 2022-05-12 MED ORDER — DIPHENHYDRAMINE HCL 50 MG/ML IJ SOLN
INTRAMUSCULAR | Status: DC | PRN
  Administered 2022-05-12: 10 mg via INTRAVENOUS

## 2022-05-12 MED ORDER — VANCOMYCIN HCL IN DEXTROSE 1-5 GM/200ML-% IV SOLN
INTRAVENOUS | Status: AC
  Administered 2022-05-12: 1000 mg via INTRAVENOUS
  Filled 2022-05-12: qty 200

## 2022-05-12 MED ORDER — DROPERIDOL 2.5 MG/ML IJ SOLN
INTRAMUSCULAR | Status: AC
  Administered 2022-05-12: 0.625 mg via INTRAVENOUS
  Filled 2022-05-12: qty 2

## 2022-05-12 MED ORDER — BISACODYL 10 MG RE SUPP: 10.0000 mg | Freq: Every day | RECTAL | Status: DC | PRN

## 2022-05-12 MED ORDER — EZETIMIBE 10 MG PO TABS
10.0000 mg | ORAL_TABLET | Freq: Every day | ORAL | Status: DC
  Administered 2022-05-12 – 2022-05-13 (×2): 10 mg via ORAL
  Filled 2022-05-12 (×2): qty 1

## 2022-05-12 MED ORDER — TRIAMCINOLONE ACETONIDE 40 MG/ML IJ SUSP
INTRAMUSCULAR | Status: DC | PRN
  Administered 2022-05-12: 72 mL

## 2022-05-12 MED ORDER — OXYCODONE HCL 5 MG PO TABS: 5.0000 mg | ORAL_TABLET | ORAL | 0 refills | Status: DC | PRN

## 2022-05-12 MED ORDER — BUPIVACAINE-EPINEPHRINE (PF) 0.5% -1:200000 IJ SOLN
INTRAMUSCULAR | Status: AC
Start: 2022-05-12 — End: ?
  Filled 2022-05-12: qty 30

## 2022-05-12 MED ORDER — DEXMEDETOMIDINE HCL IN NACL 80 MCG/20ML IV SOLN
INTRAVENOUS | Status: AC
  Filled 2022-05-12: qty 20

## 2022-05-12 MED ORDER — HYDROMORPHONE HCL 1 MG/ML IJ SOLN: 0.2500 mg | INTRAMUSCULAR | Status: DC | PRN

## 2022-05-12 MED ORDER — CYCLOBENZAPRINE HCL 10 MG PO TABS
10.0000 mg | ORAL_TABLET | Freq: Every evening | ORAL | Status: DC | PRN
  Administered 2022-05-13: 10 mg via ORAL
  Filled 2022-05-12: qty 1

## 2022-05-12 MED ORDER — TRANEXAMIC ACID 1000 MG/10ML IV SOLN
INTRAVENOUS | Status: DC | PRN
  Administered 2022-05-12: 1000 mg via TOPICAL

## 2022-05-12 MED ORDER — OXYCODONE HCL 5 MG PO TABS
5.0000 mg | ORAL_TABLET | ORAL | Status: DC | PRN
  Administered 2022-05-13 (×2): 10 mg via ORAL
  Filled 2022-05-12: qty 1
  Filled 2022-05-12: qty 2

## 2022-05-12 MED ORDER — HYDRALAZINE HCL 20 MG/ML IJ SOLN
INTRAMUSCULAR | Status: AC
  Filled 2022-05-12: qty 1

## 2022-05-12 MED ORDER — ONDANSETRON HCL 4 MG/2ML IJ SOLN
4.0000 mg | Freq: Four times a day (QID) | INTRAMUSCULAR | Status: DC | PRN
  Administered 2022-05-12: 4 mg via INTRAVENOUS

## 2022-05-12 MED ORDER — SODIUM CHLORIDE 0.9 % IR SOLN
Status: DC | PRN
  Administered 2022-05-12: 3000 mL

## 2022-05-12 MED ORDER — MIDAZOLAM HCL 2 MG/2ML IJ SOLN
INTRAMUSCULAR | Status: DC | PRN
  Administered 2022-05-12: 1 mg via INTRAVENOUS

## 2022-05-12 MED ORDER — FAMOTIDINE 20 MG PO TABS: 20.0000 mg | ORAL_TABLET | Freq: Once | ORAL | Status: DC

## 2022-05-12 MED ORDER — PROPOFOL 10 MG/ML IV BOLUS
INTRAVENOUS | Status: DC | PRN
  Administered 2022-05-12: 140 mg via INTRAVENOUS
  Administered 2022-05-12: 20 mg via INTRAVENOUS
  Administered 2022-05-12: 40 mg via INTRAVENOUS

## 2022-05-12 MED ORDER — ESMOLOL HCL 100 MG/10ML IV SOLN
INTRAVENOUS | Status: AC
Start: 2022-05-12 — End: ?
  Filled 2022-05-12: qty 10

## 2022-05-12 MED ORDER — HYDRALAZINE HCL 20 MG/ML IJ SOLN
INTRAMUSCULAR | Status: DC | PRN
  Administered 2022-05-12: 5 mg via INTRAVENOUS
  Administered 2022-05-12 (×2): 2.5 mg via INTRAVENOUS

## 2022-05-12 MED ORDER — SODIUM CHLORIDE FLUSH 0.9 % IV SOLN
INTRAVENOUS | Status: AC
  Filled 2022-05-12: qty 40

## 2022-05-12 MED ORDER — SODIUM CHLORIDE 0.9 % IV SOLN: 12.5000 mg | Freq: Four times a day (QID) | INTRAVENOUS | Status: DC | PRN

## 2022-05-12 MED ORDER — REPATHA SURECLICK 140 MG/ML ~~LOC~~ SOAJ
SUBCUTANEOUS | Status: DC
Start: 2022-05-12 — End: 2022-09-24

## 2022-05-12 MED ORDER — VANCOMYCIN HCL IN DEXTROSE 1-5 GM/200ML-% IV SOLN
1000.0000 mg | Freq: Two times a day (BID) | INTRAVENOUS | Status: AC
  Administered 2022-05-12: 1000 mg via INTRAVENOUS
  Filled 2022-05-12: qty 200

## 2022-05-12 MED ORDER — KETAMINE HCL 50 MG/5ML IJ SOSY
PREFILLED_SYRINGE | INTRAMUSCULAR | Status: AC
  Filled 2022-05-12: qty 5

## 2022-05-12 MED ORDER — HYDROMORPHONE HCL 1 MG/ML IJ SOLN
INTRAMUSCULAR | Status: DC | PRN
  Administered 2022-05-12: .5 mg via INTRAVENOUS

## 2022-05-12 MED ORDER — FAMOTIDINE 20 MG PO TABS
ORAL_TABLET | ORAL | Status: AC
  Administered 2022-05-12: 20 mg
  Filled 2022-05-12: qty 1

## 2022-05-12 SURGICAL SUPPLY — 59 items
BEARING TIBIAL 71 16 THK (Knees) IMPLANT
BIT DRILL QUICK REL 1/8 2PK SL (DRILL) IMPLANT
BLADE SAW SAG 25X90X1.19 (BLADE) ×1 IMPLANT
BLADE SURG SZ20 CARB STEEL (BLADE) ×1 IMPLANT
BNDG ELASTIC 6X5.8 VLCR NS LF (GAUZE/BANDAGES/DRESSINGS) ×1 IMPLANT
CEMENT BONE R 1X40 (Cement) ×2 IMPLANT
CEMENT VACUUM MIXING SYSTEM (MISCELLANEOUS) ×1 IMPLANT
CHLORAPREP W/TINT 26 (MISCELLANEOUS) ×1 IMPLANT
COMPONENT PATELLAR VGD 7.8X3 (Joint) IMPLANT
COOLER POLAR GLACIER W/PUMP (MISCELLANEOUS) ×1 IMPLANT
COVER MAYO STAND REUSABLE (DRAPES) ×1 IMPLANT
CUFF TOURN SGL QUICK 24 (TOURNIQUET CUFF)
CUFF TOURN SGL QUICK 34 (TOURNIQUET CUFF)
CUFF TRNQT CYL 24X4X16.5-23 (TOURNIQUET CUFF) IMPLANT
CUFF TRNQT CYL 34X4.125X (TOURNIQUET CUFF) IMPLANT
DRAPE 3/4 80X56 (DRAPES) ×1 IMPLANT
DRAPE IMP U-DRAPE 54X76 (DRAPES) ×1 IMPLANT
DRAPE U-SHAPE 47X51 STRL (DRAPES) ×1 IMPLANT
DRILL QUICK RELEASE 1/8 INCH (DRILL) ×3
DRSG MEPILEX SACRM 8.7X9.8 (GAUZE/BANDAGES/DRESSINGS) IMPLANT
DRSG OPSITE POSTOP 4X10 (GAUZE/BANDAGES/DRESSINGS) ×1 IMPLANT
DRSG OPSITE POSTOP 4X8 (GAUZE/BANDAGES/DRESSINGS) ×1 IMPLANT
ELECT REM PT RETURN 9FT ADLT (ELECTROSURGICAL) ×1
ELECTRODE REM PT RTRN 9FT ADLT (ELECTROSURGICAL) ×1 IMPLANT
GAUZE XEROFORM 1X8 LF (GAUZE/BANDAGES/DRESSINGS) ×1 IMPLANT
GLOVE BIO SURGEON STRL SZ7.5 (GLOVE) ×4 IMPLANT
GLOVE BIO SURGEON STRL SZ8 (GLOVE) ×4 IMPLANT
GLOVE BIOGEL PI IND STRL 8 (GLOVE) ×1 IMPLANT
GLOVE SURG UNDER LTX SZ8 (GLOVE) ×1 IMPLANT
GOWN STRL REUS W/ TWL LRG LVL3 (GOWN DISPOSABLE) ×1 IMPLANT
GOWN STRL REUS W/ TWL XL LVL3 (GOWN DISPOSABLE) ×1 IMPLANT
GOWN STRL REUS W/TWL LRG LVL3 (GOWN DISPOSABLE) ×1
GOWN STRL REUS W/TWL XL LVL3 (GOWN DISPOSABLE) ×1
HOOD PEEL AWAY FLYTE STAYCOOL (MISCELLANEOUS) ×3 IMPLANT
IV NS IRRIG 3000ML ARTHROMATIC (IV SOLUTION) ×1 IMPLANT
KIT TURNOVER KIT A (KITS) ×1 IMPLANT
KNEE CR FEMORAL RT 65MM (Femur) IMPLANT
MANIFOLD NEPTUNE II (INSTRUMENTS) ×1 IMPLANT
NDL SPNL 20GX3.5 QUINCKE YW (NEEDLE) ×1 IMPLANT
NEEDLE SPNL 20GX3.5 QUINCKE YW (NEEDLE) ×1 IMPLANT
NS IRRIG 1000ML POUR BTL (IV SOLUTION) ×1 IMPLANT
PACK TOTAL KNEE (MISCELLANEOUS) ×1 IMPLANT
PAD WRAPON POLAR KNEE (MISCELLANEOUS) ×1 IMPLANT
PENCIL SMOKE EVACUATOR (MISCELLANEOUS) ×1 IMPLANT
PLATE KNEE TIBIAL 71MM FIXED (Plate) IMPLANT
PULSAVAC PLUS IRRIG FAN TIP (DISPOSABLE) ×1
STAPLER SKIN PROX 35W (STAPLE) ×1 IMPLANT
SUCTION FRAZIER HANDLE 10FR (MISCELLANEOUS) ×1
SUCTION TUBE FRAZIER 10FR DISP (MISCELLANEOUS) ×1 IMPLANT
SUT VIC AB 0 CT1 36 (SUTURE) ×3 IMPLANT
SUT VIC AB 2-0 CT1 27 (SUTURE) ×3
SUT VIC AB 2-0 CT1 TAPERPNT 27 (SUTURE) ×3 IMPLANT
SYR 10ML LL (SYRINGE) ×1 IMPLANT
SYR 20ML LL LF (SYRINGE) ×1 IMPLANT
SYR 30ML LL (SYRINGE) IMPLANT
TIP FAN IRRIG PULSAVAC PLUS (DISPOSABLE) ×1 IMPLANT
TRAP FLUID SMOKE EVACUATOR (MISCELLANEOUS) ×2 IMPLANT
WATER STERILE IRR 500ML POUR (IV SOLUTION) ×1 IMPLANT
WRAPON POLAR PAD KNEE (MISCELLANEOUS) ×1

## 2022-05-12 NOTE — TOC Initial Note (Signed)
Transition of Care [facility]) - Initial/Assessment Note    Patient Details  Name: Tonya Boyd MRN: [id number] Date of Birth: 19-Jul-1956  Transition of Care [facility] CM/SW Contact:    Marlowe Sax, RN Phone Number: 05/12/2022, 3:43 PM  Clinical Narrative:                    The patient is open with [facility] for [facility] services, she has a RW and needs a 3 in1, [employer] to deliver the 3 in 1 to the bedside     Patient Goals and CMS Choice        Expected Discharge Plan and Services           Expected Discharge Date: 05/12/22                                    Prior Living Arrangements/Services                       Activities of Daily Living      Permission Sought/Granted                  Emotional Assessment              Admission diagnosis:  PRIMARY OSTEOARTHRITIS OF RIGHT KNEE. Patient Active Problem List   Diagnosis Date Noted   Severe persistent asthma with status asthmaticus    Acute respiratory failure with hypoxia (HCC) 04/02/2022   Asthma, chronic, unspecified asthma severity, with acute exacerbation 04/02/2022   Migraine headache 04/02/2022   GERD (gastroesophageal reflux disease) 04/02/2022   Hyperlipidemia 04/02/2022   Raynaud's disease without gangrene 10/15/2018   Angina pectoris (HCC) 10/07/2018   Essential hypertension 08/03/2018   CAD (coronary artery disease), native coronary artery 08/03/2018   Post PTCA 08/02/2018   Elevated IgE level 07/19/2018   Dyspnea on exertion 07/19/2018   Long-term exposure involving bird droppings 07/19/2018   Iliotibial band syndrome of left side 10/21/2011   PCP:  Georgianne Fick, MD Pharmacy:   [facility] [place] [place] - [address] [place] [address] Phone: [phone] Fax: [phone]  [facility] ([facility]) - [place], [place] - [address] [place] [address] Phone:  [phone] Fax: [phone]  [facility] [address], [place] - [address] [place] [address] [place] [address]  [place] [address] Phone: [phone] Fax: [phone]     Social Determinants of Health (SDOH) Interventions    Readmission Risk Interventions     No data to display

## 2022-05-12 NOTE — Evaluation (Signed)
Physical Therapy Evaluation Patient Details Name: Tonya Boyd MRN: [id number] DOB: Nov 29, 1955 Today's Date: 05/12/2022  History of Present Illness  Pt is a 66 y.o. female s/p R TKA secondary to DJD 05/12/22.  PMH includes asthma, COPD, htn, CAD (s/p stents), R sided CVA with residual symptoms, hiatal hernia, angina pectoris, anxiety, CKD, SVT, Raynaud's disease, ACDF, B mammoplasty, back surgery, L ankle ligament repair, B shoulder surgery.  Clinical Impression  Prior to surgery, pt was modified independent with ambulation (using SPC vs RW depending on knee pain); lives with her husband on main level of home with 4 STE R railing; pt's son plans to stay with pt for next 2 weeks to assist.  5/10 R knee pain during session.  Upon PT arrival, pt resting in post op room with lights off and nurse present (pt with nausea issues but feeling better with nurse assist).  Tolerated R LE ex's fairly well with assist for R LE as needed.  Pt received IV nausea medication prior to attempting mobility.  Unfortunately, upon initiating mobility pt's nausea returned--significantly limiting mobility (MD Poggi present and planning for overnight hospital stay d/t nausea issues).  Currently pt is CGA with transfers using RW, CGA ambulating 5 feet with RW; and min assist to lay down in bed.  Pt's BSC arrived towards end of session (pt plans to have her husband bring it home with him once he returns).  Pt would benefit from skilled PT to address noted impairments and functional limitations (see below for any additional details).  Upon hospital discharge, pt would benefit from [place] and support from family.    Recommendations for follow up therapy are one component of a multi-disciplinary discharge planning process, led by the attending physician.  Recommendations may be updated based on patient status, additional functional criteria and insurance authorization.  Follow Up Recommendations Home health PT      Assistance  Recommended at Discharge Frequent or constant Supervision/Assistance  Patient can return home with the following  A little help with walking and/or transfers;A little help with bathing/dressing/bathroom;Assistance with cooking/housework;Assist for transportation;Help with stairs or ramp for entrance    Equipment Recommendations Rolling walker (2 wheels);BSC/3in1  Recommendations for Other Services  OT consult    Functional Status Assessment Patient has had a recent decline in their functional status and demonstrates the ability to make significant improvements in function in a reasonable and predictable amount of time.     Precautions / Restrictions Precautions Precautions: Knee;Fall Precaution Booklet Issued: Yes (comment) Restrictions Weight Bearing Restrictions: Yes RLE Weight Bearing: Weight bearing as tolerated Other Position/Activity Restrictions: WBAT R LE using walker      Mobility  Bed Mobility Overal bed mobility: Needs Assistance Bed Mobility: Sit to Supine       Sit to supine: Min assist, HOB elevated   General bed mobility comments: assist for B LE's; vc's for technique    Transfers Overall transfer level: Needs assistance Equipment used: Rolling walker (2 wheels) Transfers: Sit to/from Stand, Bed to chair/wheelchair/BSC Sit to Stand: Min guard   Step pivot transfers: Min guard (recliner to [facility] with RW use)       General transfer comment: vc's for UE/LE placement and overall technique; x1 trial from recliner and x2 trials from [facility]    Ambulation/Gait Ambulation/Gait assistance: Min guard Gait Distance (Feet): 5 Feet Assistive device: Rolling walker (2 wheels)   Gait velocity: decreased     General Gait Details: antalgic; decreased stance time R LE; vc's for walker  use and overall gait technique  Stairs            Wheelchair Mobility    Modified Rankin (Stroke Patients Only)       Balance Overall balance assessment: Needs  assistance Sitting-balance support: No upper extremity supported, Feet supported Sitting balance-Leahy Scale: Good Sitting balance - Comments: steady sitting reaching within BOS   Standing balance support: Single extremity supported Standing balance-Leahy Scale: Fair Standing balance comment: pt steady standing with at least single UE support                             Pertinent Vitals/Pain Pain Assessment Pain Assessment: 0-10 Pain Score: 5  Pain Descriptors / Indicators: Aching, Tender, Sore Pain Intervention(s): Limited activity within patient's tolerance, Monitored during session, Premedicated before session, Repositioned, Other (comment) (polar care applied) Vitals (HR and O2 on room air) stable and WFL throughout treatment session.    Home Living Family/patient expects to be discharged to:: Private residence Living Arrangements: Spouse/significant other (pt's son plans to stay to assist for next 2 weeks) Available Help at Discharge: Family Type of Home: House Home Access: Stairs to enter Entrance Stairs-Rails: Right Entrance Stairs-Number of Steps: 4   Home Layout: Two level;Able to live on main level with bedroom/bathroom Home Equipment: Rolling Walker (2 wheels);Cane - single point      Prior Function Prior Level of Function : Independent/Modified Independent             Mobility Comments: Modified independent ambulating with SPC vs RW (recently using RW d/t R knee pain).       Hand Dominance        Extremity/Trunk Assessment   Upper Extremity Assessment Upper Extremity Assessment: Overall WFL for tasks assessed    Lower Extremity Assessment Lower Extremity Assessment: RLE deficits/detail (L LE WFL) RLE Deficits / Details: min assist for R LE SLR; at least 3/5 AROM hip flexion and ankle DF/PF RLE: Unable to fully assess due to pain    Cervical / Trunk Assessment Cervical / Trunk Assessment: Other exceptions Cervical / Trunk Exceptions:  forward head/shoulders  Communication   Communication: No difficulties  Cognition Arousal/Alertness: Awake/alert Behavior During Therapy: WFL for tasks assessed/performed Overall Cognitive Status: Within Functional Limits for tasks assessed                                          General Comments  Nursing cleared pt for participation in physical therapy and gave pt IV nausea meds prior to attempting mobility.  Pt agreeable to PT session.    Exercises Total Joint Exercises Ankle Circles/Pumps: AROM, Strengthening, Both, 10 reps, Supine Quad Sets: AROM, Strengthening, Both, 10 reps, Supine Short Arc Quad: AAROM, Strengthening, Right, 10 reps, Supine Heel Slides: AAROM, Strengthening, Right, 10 reps, Supine Hip ABduction/ADduction: AAROM, Strengthening, Right, 10 reps, Supine Straight Leg Raises: AAROM, Strengthening, Right, 10 reps, Supine Goniometric ROM: R knee AROM extension 10 degrees short of neutral and flexion to 60 degrees (limited d/t R knee pain)   Assessment/Plan    PT Assessment Patient needs continued PT services  PT Problem List Decreased strength;Decreased range of motion;Decreased activity tolerance;Decreased balance;Decreased mobility;Decreased knowledge of use of DME;Decreased knowledge of precautions;Pain;Decreased skin integrity       PT Treatment Interventions DME instruction;Gait training;Stair training;Functional mobility training;Therapeutic activities;Therapeutic exercise;Balance training;Patient/family education  PT Goals (Current goals can be found in the Care Plan section)  Acute Rehab PT Goals Patient Stated Goal: to improve nausea and pain PT Goal Formulation: With patient Time For Goal Achievement: 05/26/22 Potential to Achieve Goals: Good    Frequency BID     Co-evaluation               AM-PAC PT "6 Clicks" Mobility  Outcome Measure Help needed turning from your back to your side while in a flat bed without using  bedrails?: A Little Help needed moving from lying on your back to sitting on the side of a flat bed without using bedrails?: A Little Help needed moving to and from a bed to a chair (including a wheelchair)?: A Little Help needed standing up from a chair using your arms (e.g., wheelchair or bedside chair)?: A Little Help needed to walk in hospital room?: A Little Help needed climbing 3-5 steps with a railing? : A Little 6 Click Score: 18    End of Session Equipment Utilized During Treatment: Gait belt Activity Tolerance: Other (comment) (limited d/t pain and nausea) Patient left: in bed;with call bell/phone within reach;with bed alarm set;Other (comment) (R heel floating via towel roll and polar care in place; L heel floating via pillow support) Nurse Communication: Mobility status;Precautions;Weight bearing status;Other (comment) (pt's nausea) PT Visit Diagnosis: Other abnormalities of gait and mobility (R26.89);Muscle weakness (generalized) (M62.81);Pain Pain - Right/Left: Right Pain - part of body: Knee    Time: 8110-3159 PT Time Calculation (min) (ACUTE ONLY): 27 min   Charges:   PT Evaluation $PT Eval Low Complexity: 1 Low PT Treatments $Therapeutic Exercise: 8-22 mins       Hendricks Limes, PT 05/12/22, 5:13 PM

## 2022-05-12 NOTE — Anesthesia Preprocedure Evaluation (Addendum)
Anesthesia Evaluation  Patient identified by MRN, date of birth, ID band Patient awake    Reviewed: Allergy & Precautions, NPO status , Patient's Chart, lab work & pertinent test results  History of Anesthesia Complications (+) Family history of anesthesia reaction and history of anesthetic complications  Airway Mallampati: III  TM Distance: <3 FB Neck ROM: full    Dental  (+) Chipped, Poor Dentition   Pulmonary neg shortness of breath, asthma , COPD,    Pulmonary exam normal        Cardiovascular Exercise Tolerance: Good hypertension, (-) angina+ CAD and + Cardiac Stents  Normal cardiovascular exam     Neuro/Psych  Headaches, PSYCHIATRIC DISORDERS CVA (right sided), Residual Symptoms    GI/Hepatic Neg liver ROS, hiatal hernia, GERD  Controlled,  Endo/Other  negative endocrine ROS  Renal/GU Renal disease     Musculoskeletal  (+) Arthritis ,   Abdominal   Peds  Hematology negative hematology ROS (+)   Anesthesia Other Findings Past Medical History: No date: Angina pectoris (HCC) No date: Anxiety No date: Asthma 08/02/2018: CAD (coronary artery disease), native coronary artery     Comment:  a.) R/LHC 08/02/2018: EF 45%, super-dominant large RCA,               80% p-mRCA, 50% oRPDA, 80% D1; mPA 13, mPCWP 8, PA sat               81%, Ao sat 99, CO 6.95, CI 3.65 --> 4.0 x 30 mm Orsiro               DES x1 to [facility]; b.) LHC 10/07/2018: 50-60% oD1 (FFR               0.96), diffuse HG stenosis prox seg very small (<1 mm) RI              --> med mgmt; c.) LHC 04/08/2021: 80% pRCA (4.0 x 16 mm               Synergy XD DES) and 80% oPDA (2.5 x 12 mm Synergy XD DES) No date: Chronic bronchitis (HCC) No date: Chronic kidney disease No date: Complication of anesthesia     Comment:  a.) postoperative nausea only (no vomiting); also               "shakes"; per patient relieved w/ IV diphenhydramine No date: COPD (chronic  obstructive pulmonary disease) (HCC) No date: Depression 07/23/2018: Diastolic dysfunction     Comment:  a.) TTE 07/23/2018: EF 55-60%, GLS -19.6%, G1DD; b.) TTE              11/01/2020: EF 55%, triv AR, G1DD No date: Family history of adverse reaction to anesthesia     Comment:  "cousin stopped breathing; he was allergic to the               anesthesia" (08/02/2018) No date: GERD (gastroesophageal reflux disease) No date: High cholesterol No date: History of gout No date: History of hiatal hernia No date: History of kidney stones 07/2018: Hypertension No date: Interstitial cystitis No date: Long term current use of antithrombotics/antiplatelets     Comment:  a.) daily DAPT therapy (ASA + clopidogrel) No date: Migraines 07/2018: NSVT (nonsustained ventricular tachycardia) (HCC)     Comment:  a.) holter 07/13/2018: 6 beat run NSVT; maximum rate 133              bpm. No date: OA (osteoarthritis) No date: Raynaud's disease  06/08/2019: Stroke-like episode     Comment:  a.) speech difficulty, right sided               weakness/paraesthesias; unclear etiology ?? possibly               conversion disorder. MRI brain x 2 negative for stroke.  Past Surgical History: No date: ABDOMINAL HYSTERECTOMY No date: ANTERIOR CERVICAL DECOMP/DISCECTOMY FUSION No date: APPENDECTOMY No date: AUGMENTATION MAMMAPLASTY; Bilateral No date: BACK SURGERY 08/02/2018: CORONARY ANGIOPLASTY WITH STENT PLACEMENT 08/02/2018: CORONARY STENT INTERVENTION; N/A     Comment:  Procedure: CORONARY STENT INTERVENTION;  Surgeon: [name], MD;  Location: [facility] INV[facility]IVE CV [facility];  Service:               Cardiovascular;  Laterality: N/A;  RCA  04/08/2021: CORONARY STENT INTERVENTION; N/A     Comment:  Procedure: CORONARY STENT INTERVENTION;  Surgeon: [name], MD;  Location: [facility] INVASIVE CV [facility];  Service:               Cardiovascular;  Laterality: N/A;  RCA and PDA No date: CYSTOSCOPY W/ STONE  MANIPULATION 10/07/2018: INTRAVASCULAR PRESSURE WIRE/FFR STUDY; N/A     Comment:  Procedure: INTRAVASCULAR PRESSURE WIRE/FFR STUDY;                Surgeon: [name], MD;  Location: [facility] CV [facility];                Service: Cardiovascular;  Laterality: N/A; No date: KNEE ARTHROSCOPY; Right No date: LAPAROSCOPIC CHOLECYSTECTOMY 10/07/2018: LEFT HEART CATH AND CORONARY ANGIOGRAPHY; N/A     Comment:  Procedure: LEFT HEART CATH AND CORONARY ANGIOGRAPHY;                Surgeon: [name], MD;  Location: [facility];                Service: Cardiovascular;  Laterality: N/A; 04/08/2021: LEFT HEART CATH AND CORONARY ANGIOGRAPHY; N/A     Comment:  Procedure: LEFT HEART CATH AND CORONARY ANGIOGRAPHY;                Surgeon: [name], MD;  Location: [facility];                Service: Cardiovascular;  Laterality: N/A; No date: REPAIR ANKLE LIGAMENT; Left     Comment:  "tied up ligament; had tore up qthing in my ankle" 08/02/2018: RIGHT/LEFT HEART CATH AND CORONARY ANGIOGRAPHY; N/A     Comment:  Procedure: RIGHT/LEFT HEART CATH AND CORONARY               ANGIOGRAPHY;  Surgeon: [name], MD;  Location: [facility]               INVASIVE CV LAB;  Service: Cardiovascular;  Laterality:               N/A; No date: SHOULDER SURGERY; Bilateral No date: TONSILLECTOMY No date: TUBAL LIGATION  BMI    Body Mass Index: 36.00 kg/m      Reproductive/Obstetrics negative OB ROS                            Anesthesia Physical Anesthesia Plan  ASA: 3  Anesthesia Plan: General ETT   Post-op Pain Management:  Induction: Intravenous  PONV Risk Score and Plan: Ondansetron, Dexamethasone, Midazolam and Treatment may vary due to age or medical condition  Airway Management Planned: Oral ETT  Additional Equipment:   Intra-op Plan:   Post-operative Plan: Extubation in OR  Informed Consent: I have reviewed the patients History and Physical, chart, labs and  discussed the procedure including the risks, benefits and alternatives for the proposed anesthesia with the patient or authorized representative who has indicated his/her understanding and acceptance.     Dental Advisory Given  Plan Discussed with: Anesthesiologist, CRNA and Surgeon  Anesthesia Plan Comments: (Patient consented for risks of anesthesia including but not limited to:  - adverse reactions to medications - damage to eyes, teeth, lips or other oral mucosa - nerve damage due to positioning  - sore throat or hoarseness - Damage to heart, brain, nerves, lungs, other parts of body or loss of life  Patient voiced understanding.)        Anesthesia Quick Evaluation

## 2022-05-12 NOTE — Transfer of Care (Signed)
Immediate Anesthesia Transfer of Care Note  Patient: Tonya Boyd  Procedure(s) Performed: TOTAL KNEE ARTHROPLASTY (Right: Knee)  Patient Location: PACU  Anesthesia Type:General  Level of Consciousness: drowsy  Airway & Oxygen Therapy: Patient Spontanous Breathing and Patient connected to face mask oxygen  Post-op Assessment: Report given to RN and Post -op Vital signs reviewed and stable  Post vital signs: Reviewed and stable  Last Vitals:  Vitals Value Taken Time  BP 125/73 05/12/22 1400  Temp    Pulse 105 05/12/22 1403  Resp 16 05/12/22 1403  SpO2 98 % 05/12/22 1403  Vitals shown include unvalidated device data.  Last Pain:  Vitals:   05/12/22 0858  TempSrc: Temporal  PainSc: 0-No pain      Patients Stated Pain Goal: 0 (05/12/22 0858)  Complications: No notable events documented.

## 2022-05-12 NOTE — Discharge Instructions (Addendum)
Orthopedic discharge instructions: May shower with intact OpSite dressing.  Apply ice frequently to knee or use Polar Care. Start Eliquis 2.5 mg twice daily for 2 weeks on Wednesday, 05/13/2022, then resume Plavix and aspirin daily as prescribed. Take oxycodone as prescribed when needed.  May supplement with ES Tylenol if necessary. May weight-bear as tolerated on right leg - use walker for balance and support. Start physical therapy Thursday as scheduled. Follow-up in 10-14 days or as scheduled.   AMBULATORY SURGERY  DISCHARGE INSTRUCTIONS   The drugs that you were given will stay in your system until tomorrow so for the next 24 hours you should not:  Drive an automobile Make any legal decisions Drink any alcoholic beverage   You may resume regular meals tomorrow.  Today it is better to start with liquids and gradually work up to solid foods.  You may eat anything you prefer, but it is better to start with liquids, then soup and crackers, and gradually work up to solid foods.   Please notify your doctor immediately if you have any unusual bleeding, trouble breathing, redness and pain at the surgery site, drainage, fever, or pain not relieved by medication.    Additional Instructions: DO NOT REMOVE GREEN TEAL BAND FOR 4 DAYS  POLAR CARE INFORMATION  MassAdvertisement.it  How to use [employer] Polar Care [facility]?  YouTube   ShippingScam.co.uk  OPERATING INSTRUCTIONS  Start the product With dry hands, connect the transformer to the electrical connection located on the top of the cooler. Next, plug the transformer into an appropriate electrical outlet. The unit will automatically start running at this point.  To stop the pump, disconnect electrical power.  Unplug to stop the product when not in use. Unplugging the Polar Care unit turns it off. Always unplug immediately after use. Never leave it plugged in while unattended. Remove pad.    FIRST  ADD WATER TO FILL LINE, THEN ICE---Replace ice when existing ice is almost melted  1 Discuss Treatment with your Licensed Health Care Practitioner and Use Only as Prescribed 2 Apply Insulation Barrier & Cold Therapy Pad 3 Check for Moisture 4 Inspect Skin Regularly  Tips and Trouble Shooting Usage Tips 1. Use cubed or chunked ice for optimal performance. 2. It is recommended to drain the Pad between uses. To drain the pad, hold the Pad upright with the hose pointed toward the ground. Depress the black plunger and allow water to drain out. 3. You may disconnect the Pad from the unit without removing the pad from the affected area by depressing the silver tabs on the hose coupling and gently pulling the hoses apart. The Pad and unit will seal itself and will not leak. Note: Some dripping during release is normal. 4. DO NOT RUN PUMP WITHOUT WATER! The pump in this unit is designed to run with water. Running the unit without water will cause permanent damage to the pump. 5. Unplug unit before removing lid.  TROUBLESHOOTING GUIDE Pump not running, Water not flowing to the pad, Pad is not getting cold 1. Make sure the transformer is plugged into the wall outlet. 2. Confirm that the ice and water are filled to the indicated levels. 3. Make sure there are no kinks in the pad. 4. Gently pull on the blue tube to make sure the tube/pad junction is straight. 5. Remove the pad from the treatment site and ll it while the pad is lying at; then reapply. 6. Confirm that the pad couplings are  securely attached to the unit. Listen for the double clicks (Figure 1) to confirm the pad couplings are securely attached.  Leaks    Note: Some condensation on the lines, controller, and pads is unavoidable, especially in warmer climates. 1. If using a [employer] Polar Care Cold Therapy unit with a detachable Cold Therapy Pad, and a leak exists (other than condensation on the lines) disconnect the pad couplings. Make sure the  silver tabs on the couplings are depressed before reconnecting the pad to the pump hose; then confirm both sides of the coupling are properly clicked in. 2. If the coupling continues to leak or a leak is detected in the pad itself, stop using it and call [employer] Customer Care at [phone].  Cleaning After use, empty and dry the unit with a soft cloth. Warm water and mild detergent may be used occasionally to clean the pump and tubes.  WARNING: The Polar Care Cube can be cold enough to cause serious injury, including full skin necrosis. Follow these Operating Instructions, and carefully read the Product Insert (see pouch on side of unit) and the Cold Therapy Pad Fitting Instructions (provided with each Cold Therapy Pad) prior to use.         Please contact your physician with any problems or Same Day Surgery at [phone], Monday through Friday 6 am to 4 pm, or [place] at [facility] number at [phone].

## 2022-05-12 NOTE — Anesthesia Procedure Notes (Signed)
Procedure Name: Intubation Date/Time: 05/12/2022 11:26 AM  Performed by: Lynden Oxford, CRNAPre-anesthesia Checklist: Patient identified, Emergency Drugs available, Suction available and Patient being monitored Patient Re-evaluated:Patient Re-evaluated prior to induction Oxygen Delivery Method: Circle system utilized Preoxygenation: Pre-oxygenation with 100% oxygen Induction Type: IV induction Ventilation: Mask ventilation without difficulty Laryngoscope Size: McGraph and 3 Grade View: Grade I Tube type: Oral Tube size: 7.0 mm Number of attempts: 1 Airway Equipment and Method: Stylet Placement Confirmation: ETT inserted through vocal cords under direct vision, positive ETCO2 and breath sounds checked- equal and bilateral Secured at: 21 cm Tube secured with: Tape Dental Injury: Teeth and Oropharynx as per pre-operative assessment  Comments: Lauren Cozart, SRNA placed ETT under supervision.

## 2022-05-12 NOTE — Op Note (Signed)
05/12/2022  2:05 PM  Patient:   Tonya Boyd  Pre-Op Diagnosis:   Degenerative joint disease, right knee.  Post-Op Diagnosis:   Same  Procedure:   Right TKA using all-cemented Bi[employer] Vanguard system with a 65 mm PCR femur, a 71 mm tibial tray with a 16 mm anterior stabilized insert, and a 34 x 7.8 mm all-poly 3-pegged domed patella.  Surgeon:   Maryagnes Amos, MD  Assistant:   Horris Latino, PA-C   Anesthesia:   Spinal  Findings:   As above  Complications:   None  EBL:   25 cc  Fluids:   700 cc crystalloid  UOP:   None  TT:   105 minutes at 300 mmHg  Drains:   None  Closure:   Staples  Implants:   As above  Brief Clinical Note:   The patient is a 66 year old female with a long history of progressively worsening right knee pain. The patient's symptoms have progressed despite medications, activity modification, injections, etc. The patient's history and examination were consistent with advanced degenerative joint disease of the right knee confirmed by plain radiographs. The patient presents at this time for a right total knee arthroplasty.  Procedure:   The patient was brought into the operating room. After adequate spinal anesthesia was obtained, the patient was lain in the supine position before the right lower extremity was prepped with ChloraPrep solution and draped sterilely. Preoperative antibiotics were administered. A timeout was performed to verify the appropriate surgical site before the limb was exsanguinated with an Esmarch and the tourniquet inflated to 300 mmHg.   A standard anterior approach to the knee was made through an approximately 7 inch incision. The incision was carried down through the subcutaneous tissues to expose superficial retinaculum. This was split the length of the incision and the medial flap elevated sufficiently to expose the medial retinaculum. The medial retinaculum was incised, leaving a 3-4 mm cuff of tissue on the patella. This was  extended distally along the medial border of the patellar tendon and proximally through the medial third of the quadriceps tendon. A subtotal fat pad excision was performed before the soft tissues were elevated off the anteromedial and anterolateral aspects of the proximal tibia to the level of the collateral ligaments. The anterior portions of the medial and lateral menisci were removed, as was the anterior cruciate ligament. With the knee flexed to 90, the external tibial guide was positioned and the appropriate proximal tibial cut made. This piece was taken to the back table where it was measured and found to be optimally replicated by a 71 mm component.  Attention was directed to the distal femur. The intramedullary canal was accessed through a 3/8" drill hole. The intramedullary guide was inserted and positioned in order to obtain a neutral flexion gap. The intercondylar block was positioned with care taken to avoid notching the anterior cortex of the femur. The appropriate cut was made. Next, the distal cutting block was placed at 5 of valgus alignment. Using the 9 mm slot, the distal cut was made. The distal femur was measured and found to be optimally replicated by the 65 mm component. The 65 mm 4-in-1 cutting block was positioned and first the posterior, then the posterior chamfer, the anterior chamfer, and finally the anterior cuts were made. At this point, the posterior portions medial and lateral menisci were removed. A trial reduction was performed using the appropriate femoral and tibial components with the 16 mm insert. This demonstrated  excellent stability to varus and valgus stressing both in flexion and extension while permitting full extension. Patella tracking was assessed and found to be excellent. Therefore, the tibial guide position was marked on the proximal tibia. The patella thickness was measured and found to be 5 mm. Therefore, the appropriate cut was made. The patellar surface was  measured and found to be optimally replicated by the 34 mm component. The three peg holes were drilled in place before the trial button was inserted. Patella tracking was assessed and found to be excellent, passing the "no thumb test". The lug holes were drilled into the distal femur before the trial component was removed, leaving only the tibial tray. The keel was then created using the appropriate tower, reamer, and punch.  The bony surfaces were prepared for cementing by irrigating them thoroughly with sterile saline solution via the jet lavage system. A bone plug was fashioned from some of the bone that had been removed previously and used to plug the distal femoral canal. In addition, 20 cc of Exparel diluted out to 60 cc with normal saline and 30 cc of 0.5% Sensorcaine were injected into the postero-medial and postero-lateral aspects of the knee, the medial and lateral gutter regions, and the peri-incisional tissues to help with postoperative analgesia. Meanwhile, the cement was being mixed on the back table. When it was ready, the tibial tray was cemented in first. The excess cement was removed using Personal assistant. Next, the femoral component was impacted into place. Again, the excess cement was removed using Personal assistant. The 16 mm trial insert was positioned and the knee brought into extension while the cement hardened. Finally, the patella was cemented into place and secured using the patellar clamp. Again, the excess cement was removed using Personal assistant. Once the cement had hardened, the knee was placed through a range of motion with the findings as described above. Therefore, the trial insert was removed and, after verifying that no cement had been retained posteriorly, the permanent 16 mm anterior stabilized polyethylene insert was positioned and secured using the appropriate key locking mechanism. Again the knee was placed through a range of motion with the findings as described  above.  The wound was copiously irrigated with sterile saline solution using the jet lavage system before the quadriceps tendon and retinacular layer were reapproximated using #0 Vicryl interrupted sutures. The superficial retinacular layer also was closed using a running #0 Vicryl suture. A total of 10 cc of transexemic acid (TXA) was injected intra-articularly before the subcutaneous tissues were closed in several layers using 2-0 Vicryl interrupted sutures. The skin was closed using staples. A sterile honeycomb dressing was applied to the skin before the leg was wrapped with an Ace wrap to accommodate the Polar Care device. The patient was then awakened and returned to the recovery room in satisfactory condition after tolerating the procedure well.

## 2022-05-12 NOTE — H&P (Signed)
History of Present Illness: Tonya Boyd is a 66 y.o.female who is being referred by Horris Latino, PA-C, for right knee pain. The symptoms began several years ago and developed without any specific cause or injury. She has seen Horris Latino, PA-C, multiple times for the symptoms and has received steroid injections which are no longer providing any significant benefit. Therefore, she has been referred to me for further evaluation and treatment. She reports 8/10 pain on today's visit. The pain is located along the medial aspect of the knee. The pain is described as aching, dull, stabbing, and throbbing. The symptoms are aggravated constantly, with normal daily activities, with sleeping, using stairs, at higher levels of activity, rising from a chair, walking, and activity in general. She also describes occasional feelings of her leg giving way. However, she has some pain to stroke resulting in some right-sided weakness. She has mild associated swelling and deformity. She has tried acetaminophen, steroid injections, and ice with temporary partial relief of her symptoms.  Current Outpatient Medications: acebutoloL (SECTRAL) 200 MG capsule Take 200 mg by mouth 2 (two) times daily  acetaminophen (TYLENOL) 500 MG tablet Take 1,000 mg by mouth as needed for Pain  clopidogreL (PLAVIX) 75 mg tablet Take by mouth Take 1 tablet (75 mg total) by mouth daily.  cyanocobalamin (VITAMIN B12) 1,000 mcg/mL injection Inject into the muscle monthly  cyclobenzaprine (FLEXERIL) 10 MG tablet Take by mouth 1 tablet three times a day and at night  divalproex (DEPAKOTE ER) 250 MG ER tablet Take 250 mg by mouth once daily Take 1 tablet (250 mg total) by mouth daily.  ergocalciferol, vitamin D2, 1,250 mcg (50,000 unit) capsule Take 50,000 Units by mouth every Monday.  evolocumab (REPATHA SURECLICK) 140 mg/mL PnIj INJECT 140 MG INTO THE SKIN EVERY 2 WEEKS.  ezetimibe (ZETIA) 10 mg tablet Take 10 mg by mouth once daily   fluticasone propionate (FLONASE) 50 mcg/actuation nasal spray 1 spray in each nostril  hydrOXYzine (ATARAX) 25 MG tablet Take 1 tablet by mouth once daily as needed  loratadine (CLARITIN) 10 mg tablet Take by mouth Take 10 mg by mouth at bedtime.  nitroGLYcerin (NITROSTAT) 0.4 MG SL tablet Place under the tongue Place 1 tablet (0.4 mg total) under the tongue every 5 (five) minutes as needed for chest pain.  pantoprazole (PROTONIX) 20 MG DR tablet TAKE 1 TABLET BY MOUTH ONCE A DAY  SALONPAS, CAPSAICIN-MENTHOL, TOPICAL Apply 1 patch topically once daily  traZODone (DESYREL) 50 MG tablet 1-2 tablet at bedtime as needed   Allergies:  Ticagrelor Shortness Of Breath  Acetaminophen Itching  Amlodipine Besylate Other (Numbness and tingling)  Carvedilol Other (GI Upset)  Cephalexin Other (confusion)  Hydrochlorothiazide Other (Urinary frequency)  Metoclopramide Other (confusion - "Made me feel goofy") Montelukast Diarrhea  Morphine Other (hyper)  Naproxen Other (Infection in GI tract)  Nexlizet [Bempedoic Acid-Ezetimibe] Unknown  NSAIDs (Non-Steroidal Anti-Inflammatory Drug) Other (Esophagus became red and swollen)  Oxycodone Itching and Other (Welts and Hives)   Prednisone Other (Elevated BP)  Ranolazine Other (constipation)  Temazepam Hives  Tetanus Toxoid Fluid (Rash and edema) Valsartan Other (Tingling)   Past Medical History:  Angina pectoris (CMS-HCC)  Anxiety and depression  Asthma  CAD (coronary artery disease) - native coronary artery 08/03/2018  Chronic bronchitis (CMS-HCC)  Chronic kidney disease  Coronary artery disease  GERD (gastroesophageal reflux disease)  High cholesterol  History of gout  History of hiatal hernia  History of kidney stones  Hypertension  Interstitial cystitis  NSVT (  nonsustained ventricular tachycardia) (CMS-HCC) - 07/2018  Raynaud's disease   Past Surgical History:  CORONARY ANGIOPLASTY WITH STENT PLACEMENT 08/02/2018  RIGHT/LEFT HEART CATH  AND CORONARY ANGIOGRAPHY 08/02/2018  Procedure: RIGHT/LEFT HEART CATH AND CORONARY ANGIOGRAPHY; Surgeon: [name], MD; Location: [facility]; Service: Cardiovascular; Laterality: N/A  INTRAVASCULAR PRESSURE WIRE/FFR STUDY 10/07/2018  Procedure: INTRAVASCULAR PRESSURE WIRE/FFR STUDY; Surgeon: [name], MD; Location: [facility]; Service: Cardiovascular; Laterality: N/A;  Right Cardiac Cath 10/07/2018  LEFT HEART CATH AND CORONARY ANGIOGRAPHY (10/07/2018)  Surgeon: [name], MD  LEFT HEART CATH AND CORONARY ANGIOGRAPHY. CORONARY STENT INTERVENTION. Left 04/08/2021 (Surgeon: [name], MD)  ABDOMINAL HYSTERECTOMY  ANTERIOR CERVICAL DECOMP/DISCECTOMY FUSION  APPENDECTOMY  AUGMENTATION MAMMAPLASTY (Bilateral)  BACK SURGERY  CORONARY STENT INTERVENTION 1126/2019  Procedure: CORONARY STENT INTERVENTION; Surgeon: [name], MD; Location: [facility]; Service: Cardiovascular; Laterality: N/A; RCA  CYSTOSCOPY W/ STONE MANIPULATION  LAPAROSCOPIC CHOLECYSTECTOMY  REPAIR ANKLE LIGAMENT Left  Right Knee Arthroscopy  SHOULDER SURGERY  TONSILLECTOMY  TUBAL LIGATION   Family History:  Myocardial Infarction (Heart attack) Father  Heart disease Father  Diabetes Mother  Kidney failure Mother   Social History:   Socioeconomic History:  Marital status: Married  Tobacco Use  Smoking status: Never  Smokeless tobacco: Never  Vaping Use  Vaping Use: Never used  Substance and Sexual Activity  Alcohol use: Never  Drug use: Never   Review of Systems:  A comprehensive 14 point ROS was performed, reviewed, and the pertinent orthopaedic findings are documented in the HPI.  Physical Exam: Vitals:  04/27/22 1027  BP: 124/86  Weight: 96.9 kg (213 lb 9.6 oz)  Height: 165.1 cm (5\' 5" )  PainSc: 8  PainLoc: Knee   General/Constitutional: Pleasant overweight middle-aged female in no acute distress. Neuro/Psych: Normal mood and affect, oriented to person, place and  time. Eyes: Non-icteric. Pupils are equal, round, and reactive to light, and exhibit synchronous movement. Lymphatic: No palpable adenopathy. Respiratory: Lungs clear to auscultation, Normal chest excursion, No wheezes, and Non-labored breathing Cardiovascular: Regular rate and rhythm. No murmurs. and No edema, swelling or tenderness, except as noted in detailed exam. Vascular: No edema, swelling or tenderness, except as noted in detailed exam. Integumentary: No impressive skin lesions present, except as noted in detailed exam. Musculoskeletal: Unremarkable, except as noted in detailed exam.  Right knee exam: GAIT: mild limp and uses a cane. ALIGNMENT: mild varus SKIN: unremarkable SWELLING: mild EFFUSION: small WARMTH: no warmth TENDERNESS: moderate over the medial joint line, mild tenderness along lateral joint line ROM: 10 to 95 degrees with pain in maximal flexion McMURRAY'S: equivocal PATELLOFEMORAL: normal tracking with no peri-patellar tenderness and negative apprehension sign CREPITUS: Mild patellofemoral crepitance LACHMAN'S: negative PIVOT SHIFT: negative ANTERIOR DRAWER: negative POSTERIOR DRAWER: negative VARUS/VALGUS: Mildly positive pseudolaxity to varus stressing  She is grossly neurovascularly intact to the right lower extremity and foot, although she does have some weakness with ankle dorsiflexion resulting from her stroke.  Knee Imaging: Recent AP weightbearing of both knees, as well as lateral and merchant views of the right knee are available for review and have been reviewed by myself. These films demonstrate severe degenerative changes, primarily involving the medial compartment with 100% medial joint space narrowing. Overall alignment is mild varus. No fractures, lytic lesions, or abnormal calcifications are noted.  Assessment:  Primary osteoarthritis of right knee.   Plan: The treatment options were discussed with the patient and her sister-in-law. In  addition, patient educational materials were provided regarding the  diagnosis and treatment options. The patient is quite frustrated with her symptoms and functional limitations, and is ready to consider more aggressive treatment options. Therefore, I have recommended a surgical procedure, specifically a right total knee arthroplasty. The procedure was discussed with the patient, as were the potential risks (including bleeding, infection, nerve and/or blood vessel injury, persistent or recurrent pain, loosening and/or failure of the components, dislocation, need for further surgery, blood clots, strokes, heart attacks and/or arhythmias, pneumonia, etc.) and benefits. The patient states his/her understanding and wishes to proceed. All of the patient's questions and concerns were answered. She can call any time with further concerns. She will follow up post-surgery, routine.   H&P reviewed and patient re-examined. No changes.

## 2022-05-12 NOTE — Progress Notes (Cosign Needed)
Patient is not able to walk the distance required to go the bathroom, or he/she is unable to safely negotiate stairs required to access the bathroom.  A 3in1 BSC will alleviate this problem  

## 2022-05-13 ENCOUNTER — Encounter: Payer: Self-pay | Admitting: Surgery

## 2022-05-13 DIAGNOSIS — J449 Chronic obstructive pulmonary disease, unspecified: Secondary | ICD-10-CM | POA: Diagnosis not present

## 2022-05-13 DIAGNOSIS — Z7902 Long term (current) use of antithrombotics/antiplatelets: Secondary | ICD-10-CM | POA: Diagnosis not present

## 2022-05-13 DIAGNOSIS — Z8673 Personal history of transient ischemic attack (TIA), and cerebral infarction without residual deficits: Secondary | ICD-10-CM | POA: Diagnosis not present

## 2022-05-13 DIAGNOSIS — Z955 Presence of coronary angioplasty implant and graft: Secondary | ICD-10-CM | POA: Diagnosis not present

## 2022-05-13 DIAGNOSIS — M1711 Unilateral primary osteoarthritis, right knee: Secondary | ICD-10-CM | POA: Diagnosis not present

## 2022-05-13 DIAGNOSIS — I129 Hypertensive chronic kidney disease with stage 1 through stage 4 chronic kidney disease, or unspecified chronic kidney disease: Secondary | ICD-10-CM | POA: Diagnosis not present

## 2022-05-13 DIAGNOSIS — J45909 Unspecified asthma, uncomplicated: Secondary | ICD-10-CM | POA: Diagnosis not present

## 2022-05-13 DIAGNOSIS — N189 Chronic kidney disease, unspecified: Secondary | ICD-10-CM | POA: Diagnosis not present

## 2022-05-13 DIAGNOSIS — I25119 Atherosclerotic heart disease of native coronary artery with unspecified angina pectoris: Secondary | ICD-10-CM | POA: Diagnosis not present

## 2022-05-13 DIAGNOSIS — Z79899 Other long term (current) drug therapy: Secondary | ICD-10-CM | POA: Diagnosis not present

## 2022-05-13 LAB — CBC
HCT: 36 % (ref 36.0–46.0)
Hemoglobin: 12.1 g/dL (ref 12.0–15.0)
MCH: 31.3 pg (ref 26.0–34.0)
MCHC: 33.6 g/dL (ref 30.0–36.0)
MCV: 93 fL (ref 80.0–100.0)
Platelets: 292 10*3/uL (ref 150–400)
RBC: 3.87 MIL/uL (ref 3.87–5.11)
RDW: 12.5 % (ref 11.5–15.5)
WBC: 17.4 10*3/uL — ABNORMAL HIGH (ref 4.0–10.5)
nRBC: 0 % (ref 0.0–0.2)

## 2022-05-13 LAB — BASIC METABOLIC PANEL
Anion gap: 10 (ref 5–15)
BUN: 13 mg/dL (ref 8–23)
CO2: 22 mmol/L (ref 22–32)
Calcium: 9.2 mg/dL (ref 8.9–10.3)
Chloride: 104 mmol/L (ref 98–111)
Creatinine, Ser: 0.9 mg/dL (ref 0.44–1.00)
GFR, Estimated: >60 mL/min (ref >60)
Glucose, Bld: 196 mg/dL — ABNORMAL HIGH (ref 70–99)
Potassium: 4.5 mmol/L (ref 3.5–5.1)
Sodium: 136 mmol/L (ref 135–145)

## 2022-05-13 MED ORDER — ONDANSETRON HCL 4 MG PO TABS: 4.0000 mg | ORAL_TABLET | Freq: Four times a day (QID) | ORAL | 0 refills | Status: DC | PRN

## 2022-05-13 MED ORDER — ACEBUTOLOL HCL 200 MG PO CAPS
200.0000 mg | ORAL_CAPSULE | Freq: Two times a day (BID) | ORAL | Status: DC
  Administered 2022-05-13: 200 mg via ORAL
  Filled 2022-05-13: qty 1

## 2022-05-13 NOTE — Progress Notes (Signed)
Subjective: 1 Day Post-Op Procedure(s) (LRB): TOTAL KNEE ARTHROPLASTY (Right) Patient reports pain as moderate.   Patient is  well but is tachycardic this morning. Plan is to go Home after hospital stay. Negative for chest pain and shortness of breath Fever: no Gastrointestinal:Positive for nausea, but no vomiting this morning. Reports she is passing gas without pain.    Objective: Vital signs in last 24 hours: Temp:  [97.1 F (36.2 C)-98.8 F (37.1 C)] 97.8 F (36.6 C) (09/06 0727) Pulse Rate:  [93-108] 108 (09/06 0727) Resp:  [13-29] 16 (09/06 0727) BP: (116-168)/(68-103) 168/87 (09/06 0727) SpO2:  [90 %-100 %] 99 % (09/06 0727) Weight:  [96.6 kg] 96.6 kg (09/05 0858)  Intake/Output from previous day:  Intake/Output Summary (Last 24 hours) at 05/13/2022 0750 Last data filed at 05/13/2022 0327 Gross per 24 hour  Intake 1179.77 ml  Output 175 ml  Net 1004.77 ml    Intake/Output this shift: No intake/output data recorded.  Labs: No results for input(s): "HGB" in the last 72 hours. No results for input(s): "WBC", "RBC", "HCT", "PLT" in the last 72 hours. No results for input(s): "NA", "K", "CL", "CO2", "BUN", "CREATININE", "GLUCOSE", "CALCIUM" in the last 72 hours. No results for input(s): "LABPT", "INR" in the last 72 hours.   EXAM General - Patient is Alert, Appropriate, and Oriented Cardiac- Regular rhythm, tachycardic this morning. Extremity - ABD soft Neurovascular intact Dorsiflexion/Plantar flexion intact Incision: dressing C/D/I No cellulitis present Compartment soft Dressing/Incision - clean, dry, no drainage noted to the right knee. Motor Function - intact, moving foot and toes well on exam.  Abdomen soft with intact bowel sounds.  Past Medical History:  Diagnosis Date   Angina pectoris (HCC)    Anxiety    Asthma    CAD (coronary artery disease), native coronary artery 08/02/2018   a.) [facility] 08/02/2018: EF 45%, super-dominant large RCA, 80% p-mRCA,  50% oRPDA, 80% D1; mPA 13, mPCWP 8, PA sat 81%, Ao sat 99, CO 6.95, CI 3.65 --> 4.0 x 30 mm Orsiro DES x1 to [facility]; b.) [facility] 10/07/2018: 50-60% oD1 (FFR 0.96), diffuse HG stenosis prox seg very small (<1 mm) RI --> med mgmt; c.) [facility] 04/08/2021: 80% pRCA (4.0 x 16 mm Synergy XD DES) and 80% oPDA (2.5 x 12 mm Synergy XD DES)   Chronic bronchitis (HCC)    Chronic kidney disease    Complication of anesthesia    a.) postoperative nausea only (no vomiting); also "shakes"; per patient relieved w/ IV diphenhydramine   COPD (chronic obstructive pulmonary disease) (HCC)    Depression    Diastolic dysfunction 07/23/2018   a.) TTE 07/23/2018: EF 55-60%, GLS -19.6%, G1DD; b.) TTE 11/01/2020: EF 55%, triv AR, G1DD   Family history of adverse reaction to anesthesia    "cousin stopped breathing; he was allergic to the anesthesia" (08/02/2018)   GERD (gastroesophageal reflux disease)    High cholesterol    History of gout    History of hiatal hernia    History of kidney stones    Hypertension 07/2018   Interstitial cystitis    Long term current use of antithrombotics/antiplatelets    a.) daily DAPT therapy (ASA + clopidogrel)   Migraines    NSVT (nonsustained ventricular tachycardia) (HCC) 07/2018   a.) holter 07/13/2018: 6 beat run NSVT; maximum rate 133 bpm.   OA (osteoarthritis)    Raynaud's disease    Stroke-like episode 06/08/2019   a.) speech difficulty, right sided weakness/paraesthesias; unclear etiology ?? possibly conversion disorder.  MRI brain x 2 negative for stroke.    Assessment/Plan: 1 Day Post-Op Procedure(s) (LRB): TOTAL KNEE ARTHROPLASTY (Right) Principal Problem:   Status post total knee replacement using cement, right  Estimated body mass index is 36 kg/m as calculated from the following:   Height as of this encounter: 5' 4.5" (1.638 m).   Weight as of this encounter: 96.6 kg. Advance diet Up with therapy D/C IV fluids when tolerating po intake.  Vitals reviewed, HR 108,  BP 168/87.  Patient is on Acebutolol at home, re-started medication this morning.  Labs not available yet. Up with therapy today, patient has several steps to get over to get into home. Nausea has improved this morning.  Patient is passing gas without pain. Plan for possible d/c home this afternoon pending her progress with PT.  DVT Prophylaxis - TED hose and Eliquis Weight-Bearing as tolerated to right leg  J. Horris Latino, PA-C [facility] Orthopaedic Surgery 05/13/2022, 7:50 AM

## 2022-05-13 NOTE — Progress Notes (Signed)
Physical Therapy Treatment Patient Details Name: Tonya Boyd MRN: [id number] DOB: May 14, 1956 Today's Date: 05/13/2022   History of Present Illness Pt is a 66 y.o. female s/p R TKA secondary to DJD 05/12/22.  PMH includes asthma, COPD, htn, CAD (s/p stents), R sided CVA with residual symptoms, hiatal hernia, angina pectoris, anxiety, CKD, SVT, Raynaud's disease, ACDF, B mammoplasty, back surgery, L ankle ligament repair, B shoulder surgery.    PT Comments    Pt was sitting in recliner, alert and oriented. " I hope I can go home today." Pt was able to stand from recliner and ambulate to rehab gym, performed stair and returned to room. HR did elevated to 117 bpm at its peak but pt endorsed feeling well throughout. Several standing rest breaks during gait training. She performed ascending/descending 4 stair with side stepping up stairs with BUE on R rail/step to gait pattern. No difficulty or safety concerns. Demonstrated to son/spouse in prep for returning home. Pt is cleared from an acute PT standpoint to safely return home with HHPT to follow. Will continue to benefit from skilled PT to address strength, ROM, and balance deficits while maximizing independence with ADLs.      Recommendations for follow up therapy are one component of a multi-disciplinary discharge planning process, led by the attending physician.  Recommendations may be updated based on patient status, additional functional criteria and insurance authorization.  Follow Up Recommendations  Home health PT     Assistance Recommended at Discharge Frequent or constant Supervision/Assistance  Patient can return home with the following A little help with walking and/or transfers;A little help with bathing/dressing/bathroom;Assistance with cooking/housework;Assist for transportation;Help with stairs or ramp for entrance   Equipment Recommendations  Rolling walker (2 wheels);BSC/3in1       Precautions / Restrictions  Precautions Precautions: Fall;Knee Precaution Booklet Issued: Yes (comment) Restrictions Weight Bearing Restrictions: Yes RLE Weight Bearing: Weight bearing as tolerated Other Position/Activity Restrictions: WBAT R LE using walker     Mobility  Bed Mobility    General bed mobility comments: in recliner pre session. was able to return to supine with min assist. will have assistance available at DC.    Transfers Overall transfer level: Needs assistance Equipment used: Rolling walker (2 wheels) Transfers: Sit to/from Stand Sit to Stand: Supervision    General transfer comment: no physical assistance required to stand from recliner or mat table.    Ambulation/Gait Ambulation/Gait assistance: Supervision Gait Distance (Feet): 200 Feet Assistive device: Rolling walker (2 wheels) Gait Pattern/deviations: Step-through pattern, Antalgic Gait velocity: decreased     General Gait Details: pt was able to ambulate ~ 200 ft with RW. HR elevates to 117 bpm at peak but pt endorses feeling well throughout. does take sveerl standing rest breaks but O2 was able and pt endorses feeling well on room air.   Stairs Stairs: Yes Stairs assistance: Supervision Stair Management: One rail Right, Step to pattern, Sideways Number of Stairs: 4 General stair comments: pt was able to safely ambulate up/down 4 stair with supervision only    Balance Overall balance assessment: Modified Independent Sitting-balance support: Feet supported, No upper extremity supported Sitting balance-Leahy Scale: Good     Standing balance support: Bilateral upper extremity supported Standing balance-Leahy Scale: Good     Cognition Arousal/Alertness: Awake/alert Behavior During Therapy: WFL for tasks assessed/performed Overall Cognitive Status: Within Functional Limits for tasks assessed      General Comments: Pt is A and oriented however does report having some cognition deficits since CVA  3 years prior         Exercises Total Joint Exercises Goniometric ROM: 3-88    General Comments General comments (skin integrity, edema, etc.): reviewed HEP and had pt perform ROM while seated EOB. demonstarted ~ 88degrees of flexion with lacking ~ 3 degrees extension      Pertinent Vitals/Pain Pain Assessment Pain Assessment: 0-10 Pain Score: 5  Pain Location: knee Pain Descriptors / Indicators: Aching, Tender, Sore Pain Intervention(s): Limited activity within patient's tolerance, Monitored during session, Premedicated before session, Repositioned, Ice applied    Home Living Family/patient expects to be discharged to:: Private residence Living Arrangements: Spouse/significant other Available Help at Discharge: Family Type of Home: House Home Access: Stairs to enter Entrance Stairs-Rails: Right Entrance Stairs-Number of Steps: 4   Home Layout: Two level;Able to live on main level with bedroom/bathroom Home Equipment: Rolling Walker (2 wheels);Cane - single point;BSC/3in1;Shower seat          PT Goals (current goals can now be found in the care plan section) Acute Rehab PT Goals Patient Stated Goal: go home Progress towards PT goals: Progressing toward goals    Frequency    BID      PT Plan Current plan remains appropriate       AM-PAC PT "6 Clicks" Mobility   Outcome Measure  Help needed turning from your back to your side while in a flat bed without using bedrails?: A Little Help needed moving from lying on your back to sitting on the side of a flat bed without using bedrails?: A Little Help needed moving to and from a bed to a chair (including a wheelchair)?: A Little Help needed standing up from a chair using your arms (e.g., wheelchair or bedside chair)?: A Little Help needed to walk in hospital room?: A Little Help needed climbing 3-5 steps with a railing? : A Little 6 Click Score: 18    End of Session   Activity Tolerance: Patient tolerated treatment well Patient left:  in bed;with call bell/phone within reach;with bed alarm set;Other (comment) Nurse Communication: Mobility status;Precautions;Weight bearing status;Other (comment) PT Visit Diagnosis: Other abnormalities of gait and mobility (R26.89);Muscle weakness (generalized) (M62.81);Pain Pain - Right/Left: Right Pain - part of body: Knee     Time: 0926-1001 PT Time Calculation (min) (ACUTE ONLY): 35 min  Charges:  $Gait Training: 8-22 mins $Therapeutic Exercise: 8-22 mins                    Jetta Lout PTA 05/13/22, 12:43 PM

## 2022-05-13 NOTE — Discharge Summary (Signed)
Physician Discharge Summary  Patient ID: Tonya Boyd MRN: [id number] DOB/AGE: 1956-08-30 66 y.o.  Admit date: 05/12/2022 Discharge date: 05/13/2022  Admission Diagnoses:  Status post total knee replacement using cement, right [Z96.651] Right knee degenerative joint disease  Discharge Diagnoses: Patient Active Problem List   Diagnosis Date Noted   Status post total knee replacement using cement, right 05/12/2022   Severe persistent asthma with status asthmaticus    Acute respiratory failure with hypoxia (HCC) 04/02/2022   Asthma, chronic, unspecified asthma severity, with acute exacerbation 04/02/2022   Migraine headache 04/02/2022   GERD (gastroesophageal reflux disease) 04/02/2022   Hyperlipidemia 04/02/2022   Raynaud's disease without gangrene 10/15/2018   Angina pectoris (HCC) 10/07/2018   Essential hypertension 08/03/2018   CAD (coronary artery disease), native coronary artery 08/03/2018   Post PTCA 08/02/2018   Elevated IgE level 07/19/2018   Dyspnea on exertion 07/19/2018   Long-term exposure involving bird droppings 07/19/2018   Iliotibial band syndrome of left side 10/21/2011    Past Medical History:  Diagnosis Date   Angina pectoris (HCC)    Anxiety    Asthma    CAD (coronary artery disease), native coronary artery 08/02/2018   a.) [facility] 08/02/2018: EF 45%, super-dominant large RCA, 80% p-mRCA, 50% oRPDA, 80% D1; mPA 13, mPCWP 8, PA sat 81%, Ao sat 99, CO 6.95, CI 3.65 --> 4.0 x 30 mm Orsiro DES x1 to [facility]; b.) [facility] 10/07/2018: 50-60% oD1 (FFR 0.96), diffuse HG stenosis prox seg very small (<1 mm) RI --> med mgmt; c.) [facility] 04/08/2021: 80% pRCA (4.0 x 16 mm Synergy XD DES) and 80% oPDA (2.5 x 12 mm Synergy XD DES)   Chronic bronchitis (HCC)    Chronic kidney disease    Complication of anesthesia    a.) postoperative nausea only (no vomiting); also "shakes"; per patient relieved w/ IV diphenhydramine   COPD (chronic obstructive pulmonary disease) (HCC)     Depression    Diastolic dysfunction 07/23/2018   a.) TTE 07/23/2018: EF 55-60%, GLS -19.6%, G1DD; b.) TTE 11/01/2020: EF 55%, triv AR, G1DD   Family history of adverse reaction to anesthesia    "cousin stopped breathing; he was allergic to the anesthesia" (08/02/2018)   GERD (gastroesophageal reflux disease)    High cholesterol    History of gout    History of hiatal hernia    History of kidney stones    Hypertension 07/2018   Interstitial cystitis    Long term current use of antithrombotics/antiplatelets    a.) daily DAPT therapy (ASA + clopidogrel)   Migraines    NSVT (nonsustained ventricular tachycardia) (HCC) 07/2018   a.) holter 07/13/2018: 6 beat run NSVT; maximum rate 133 bpm.   OA (osteoarthritis)    Raynaud's disease    Stroke-like episode 06/08/2019   a.) speech difficulty, right sided weakness/paraesthesias; unclear etiology ?? possibly conversion disorder. MRI brain x 2 negative for stroke.     Transfusion: None.   Consultants (if any):   Discharged Condition: Improved  Hospital Course: Tonya Boyd is an 66 y.o. female who was admitted 05/12/2022 with a diagnosis of right knee degenerative joint disease and went to the operating room on 05/12/2022 and underwent the above named procedures.    Surgeries: Procedure(s): TOTAL KNEE ARTHROPLASTY on 05/12/2022 Patient tolerated the surgery well. Taken to PACU where she was stabilized and then transferred to the orthopedic floor.  Started on Eliquis 2.5mg  twice daily. Foot pumps applied bilaterally at 80 mm. Heels elevated on bed with  rolled towels. No evidence of DVT. Negative Homan. Physical therapy started on day #1 for gait training and transfer. OT started day #1 for ADL and assisted devices.  HR was found to be 108 on POD1.  Patient had no received her acebutolol for the past 24 hours.  Restarted on her beta blocker with improved rate control.  Patient's IV was removed on POD1.  Implants: Right TKA using  all-cemented [employer] Vanguard system with a 65 mm PCR femur, a 71 mm tibial tray with a 16 mm anterior stabilized insert, and a 34 x 7.8 mm all-poly 3-pegged domed patella.  She was given perioperative antibiotics:  Anti-infectives (From admission, onward)    Start     Dose/Rate Route Frequency Ordered Stop   05/12/22 2100  vancomycin (VANCOCIN) IVPB 1000 mg/200 mL premix        1,000 mg 200 mL/hr over 60 Minutes Intravenous Every 12 hours 05/12/22 1848 05/13/22 1139   05/12/22 0830  vancomycin (VANCOCIN) IVPB 1000 mg/200 mL premix        1,000 mg 200 mL/hr over 60 Minutes Intravenous On call to O.R. 05/12/22 0827 05/12/22 1053     .  She was given sequential compression devices, early ambulation, and Eliquis for DVT prophylaxis.  She benefited maximally from the hospital stay and there were no complications.    Recent vital signs:  Vitals:   05/13/22 0727 05/13/22 1257  BP: (!) 168/87 133/87  Pulse: (!) 108 98  Resp: 16   Temp: 97.8 F (36.6 C)   SpO2: 99% 99%    Recent laboratory studies:  Lab Results  Component Value Date   HGB 12.1 05/13/2022   HGB 14.0 04/02/2022   HGB 13.7 07/27/2021   Lab Results  Component Value Date   WBC 17.4 (H) 05/13/2022   PLT 292 05/13/2022   Lab Results  Component Value Date   INR 1.1 06/13/2019   Lab Results  Component Value Date   NA 136 05/13/2022   K 4.5 05/13/2022   CL 104 05/13/2022   CO2 22 05/13/2022   BUN 13 05/13/2022   CREATININE 0.90 05/13/2022   GLUCOSE 196 (H) 05/13/2022    Discharge Medications:   Allergies as of 05/13/2022       Reactions   Adhesive [tape] Other (See Comments)   SKIN WILL TEAR EASILY!!!!   Brilinta [ticagrelor] Shortness Of Breath   Statins Other (See Comments)   myalgia   Carvedilol    Unknown reaction    Diovan [valsartan]    Tingling    Hydrochlorothiazide    Unknown reaction    Ibuprofen    Causes bp to go up   Keflex [cephalexin]    confusion   Morphine And Related     Hyper, ineffective    Norvasc [amlodipine Besylate]    Numbness and tingling    Nsaids    Esophagus became red and swollen (tolerates meloxicam)    Prednisone    Elevated BP   Reglan [metoclopramide]    Confusion, altered mental status   Singulair [montelukast] Diarrhea   Temazepam Hives        Medication List     STOP taking these medications    albuterol 108 (90 Base) MCG/ACT inhaler Commonly known as: VENTOLIN HFA   clopidogrel 75 MG tablet Commonly known as: Plavix   ipratropium-albuterol 0.5-2.5 (3) MG/3ML Soln Commonly known as: DUONEB   oxybutynin 5 MG tablet Commonly known as: DITROPAN   predniSONE 50 MG tablet Commonly  known as: DELTASONE       TAKE these medications    acebutolol 200 MG capsule Commonly known as: SECTRAL Take 1 capsule (200 mg total) by mouth 2 (two) times daily.   acetaminophen 500 MG tablet Commonly known as: TYLENOL Take 1,000 mg by mouth every 8 (eight) hours as needed for moderate pain or headache.   apixaban 2.5 MG Tabs tablet Commonly known as: Eliquis Take 1 tablet (2.5 mg total) by mouth 2 (two) times daily.   aspirin EC 81 MG tablet Take 81 mg by mouth daily.   cyanocobalamin 1000 MCG/ML injection Commonly known as: VITAMIN B12 Inject 1,000 mcg into the skin every 30 (thirty) days.   cyclobenzaprine 10 MG tablet Commonly known as: FLEXERIL Take 10 mg by mouth 3 (three) times daily as needed for muscle spasms. TAKES 1 TAB AT BEDTIME   divalproex 250 MG 24 hr tablet Commonly known as: DEPAKOTE ER Take 1 tablet (250 mg total) by mouth daily.   ezetimibe 10 MG tablet Commonly known as: ZETIA Take 1 tablet (10 mg total) by mouth daily.   hydrOXYzine 25 MG tablet Commonly known as: ATARAX Take 25 mg by mouth 4 (four) times daily as needed for itching.   loratadine 10 MG tablet Commonly known as: CLARITIN Take 10 mg by mouth at bedtime.   nitroGLYCERIN 0.4 MG SL tablet Commonly known as: NITROSTAT DISSOLVE  1 TABLET SUBLINGUALLY AS NEEDED FOR CHEST PAIN, MAY REPEAT EVERY5 MINUTES. AFTER 3 CALL 911. What changed: See the new instructions.   ondansetron 4 MG tablet Commonly known as: Zofran Take 1 tablet (4 mg total) by mouth every 6 (six) hours as needed for nausea or vomiting.   oxyCODONE 5 MG immediate release tablet Commonly known as: Roxicodone Take 1-2 tablets (5-10 mg total) by mouth every 4 (four) hours as needed for moderate pain or severe pain.   pantoprazole 20 MG tablet Commonly known as: PROTONIX Take 1 tablet (20 mg total) by mouth daily.   Repatha SureClick 140 MG/ML Soaj Generic drug: Evolocumab PA approved 10/26/2021 through 11/05/2022. Take as prescribed every 2 weeks. What changed: additional instructions   traZODone 50 MG tablet Commonly known as: DESYREL Take 100 mg by mouth at bedtime.   Vitamin D (Ergocalciferol) 1.25 MG (50000 UNIT) Caps capsule Commonly known as: DRISDOL Take 50,000 Units by mouth every Monday.               Durable Medical Equipment  (From admission, onward)           Start     Ordered   05/12/22 1848  DME 3 n 1  Once        05/12/22 1848   05/12/22 1848  DME Bedside commode  Once       Question:  Patient needs a bedside commode to treat with the following condition  Answer:  Status post total knee replacement using cement, right   05/12/22 1848   05/12/22 1543  For home use only DME Bedside commode  Once       Question:  Patient needs a bedside commode to treat with the following condition  Answer:  Impaired mobility   05/12/22 1542           Diagnostic Studies: DG Knee Right Port  Result Date: 05/12/2022 CLINICAL DATA:  Status post right knee arthroplasty. EXAM: PORTABLE RIGHT KNEE - 1-2 VIEW COMPARISON:  None Available. FINDINGS: Status post right knee arthroplasty. There are recent postoperative changes including  a mild-to-moderate joint effusion and intra-articular air. Anterior surgical skin staples. No  perihardware lucency is seen to indicate hardware failure or loosening. No acute fracture or dislocation. IMPRESSION: Status post total right knee arthroplasty without evidence of hardware failure. Electronically Signed   By: Neita Garnet M.D.   On: 05/12/2022 15:26    Disposition: Plan for discharge home this afternoon pending progress with PT.   Follow-up Information     Anson [place], PA-C Follow up in 14 day(s).   Specialty: Physician Assistant Why: Mindi Slicker information: [address] [place] [address] [phone]                Signed: Meriel Pica PA-C 05/13/2022, 1:16 PM

## 2022-05-13 NOTE — Evaluation (Signed)
Occupational Therapy Evaluation Patient Details Name: Tonya Boyd MRN: [id number] DOB: 1955-09-12 Today's Date: 05/13/2022   History of Present Illness Pt is a 66 y.o. female s/p R TKA secondary to DJD 05/12/22.  PMH includes asthma, COPD, htn, CAD (s/p stents), R sided CVA with residual symptoms, hiatal hernia, angina pectoris, anxiety, CKD, SVT, Raynaud's disease, ACDF, B mammoplasty, back surgery, L ankle ligament repair, B shoulder surgery.   Clinical Impression   Patient presenting with decrease independence in self-care and functional mobility. Family also present in room upon arrival. Patient lives at home with spouse that has a ramp and 4 stairs at the entrance with railing only on the right side. Patient currently functioning at supervision for bed mobility, sit<>stand and LB dressing (donning and doffing socks, donning undergarments, and pants while seated) . Patient's spouse and son were able to educate therapist on polar care system and the wearing schedule. Pt stated she is familiar with OT and DME from previous stroke. Pt has SPC, BSC, RW, and shower chair in the home. Pt was also aware of WBAT precaution. Pt was also educated on proper body mechanics when transitioning from sit to stand. No Acute OT needed at this time. OT to complete order and sign off       Recommendations for follow up therapy are one component of a multi-disciplinary discharge planning process, led by the attending physician.  Recommendations may be updated based on patient status, additional functional criteria and insurance authorization.   Follow Up Recommendations  No OT follow up    Assistance Recommended at Discharge Set up Supervision/Assistance  Patient can return home with the following A little help with walking and/or transfers;A little help with bathing/dressing/bathroom;Assistance with cooking/housework;Assist for transportation    Functional Status Assessment  Patient has not had a recent  decline in their functional status  Equipment Recommendations       Recommendations for Other Services       Precautions / Restrictions Precautions Precautions: Fall;Knee Restrictions Weight Bearing Restrictions: Yes RLE Weight Bearing: Weight bearing as tolerated      Mobility Bed Mobility Overal bed mobility: Modified Independent Bed Mobility: Supine to Sit     Supine to sit: Supervision          Transfers   Equipment used: Rolling walker (2 wheels) Transfers: Sit to/from Stand Sit to Stand: Supervision                  Balance Overall balance assessment: Modified Independent Sitting-balance support: Feet supported, No upper extremity supported Sitting balance-Leahy Scale: Good       Standing balance-Leahy Scale: Good                             ADL either performed or assessed with clinical judgement   ADL Overall ADL's : Needs assistance/impaired                                             Vision Baseline Vision/History: 1 Wears glasses              Pertinent Vitals/Pain Pain Assessment Pain Assessment: No/denies pain           Communication Communication Communication: No difficulties   Cognition Arousal/Alertness: Awake/alert Behavior During Therapy: WFL for tasks assessed/performed Overall Cognitive Status: Within Functional Limits for  tasks assessed                                                  Home Living Family/patient expects to be discharged to:: Private residence Living Arrangements: Spouse/significant other Available Help at Discharge: Family Type of Home: House Home Access: Stairs to enter [employer] of Steps: 4 Entrance Stairs-Rails: Right Home Layout: Two level;Able to live on main level with bedroom/bathroom     Bathroom Shower/Tub: Chief Strategy Officer: Standard     Home Equipment: Agricultural consultant (2 wheels);Cane - single  point;BSC/3in1;Shower seat          Prior Functioning/Environment Prior Level of Function : Independent/Modified Independent                         AM-PAC OT "6 Clicks" Daily Activity     Outcome Measure Help from another person eating meals?: None Help from another person taking care of personal grooming?: A Little Help from another person toileting, which includes using toliet, bedpan, or urinal?: A Little Help from another person bathing (including washing, rinsing, drying)?: A Little Help from another person to put on and taking off regular upper body clothing?: None Help from another person to put on and taking off regular lower body clothing?: A Little 6 Click Score: 20   End of Session Equipment Utilized During Treatment: Rolling walker (2 wheels)  Activity Tolerance: Patient tolerated treatment well Patient left: in bed;with call bell/phone within reach;with bed alarm set;with family/visitor present                   Time: 2947-6546 OT Time Calculation (min): 18 min Charges:  OT General Charges $OT Visit: 1 Visit OT Evaluation $OT Eval Low Complexity: 1 Low OT Treatments $Self Care/Home Management : 8-22 mins   Randalyn Ahmed, OTS 05/13/2022, 11:19 AM

## 2022-05-13 NOTE — Anesthesia Postprocedure Evaluation (Signed)
Anesthesia Post Note  Patient: Tonya Boyd  Procedure(s) Performed: TOTAL KNEE ARTHROPLASTY (Right: Knee)  Patient location during evaluation: PACU Anesthesia Type: General Level of consciousness: awake and alert Pain management: pain level controlled Vital Signs Assessment: post-procedure vital signs reviewed and stable Respiratory status: spontaneous breathing, nonlabored ventilation and respiratory function stable Cardiovascular status: blood pressure returned to baseline and stable Postop Assessment: no apparent nausea or vomiting Anesthetic complications: no   No notable events documented.   Last Vitals:  Vitals:   05/13/22 0727 05/13/22 1257  BP: (!) 168/87 133/87  Pulse: (!) 108 98  Resp: 16   Temp: 36.6 C   SpO2: 99% 99%    Last Pain:  Vitals:   05/13/22 1257  TempSrc:   PainSc: 10-Worst pain ever                 Foye Deer

## 2022-05-14 DIAGNOSIS — G43909 Migraine, unspecified, not intractable, without status migrainosus: Secondary | ICD-10-CM | POA: Diagnosis not present

## 2022-05-14 DIAGNOSIS — K219 Gastro-esophageal reflux disease without esophagitis: Secondary | ICD-10-CM | POA: Diagnosis not present

## 2022-05-14 DIAGNOSIS — M7632 Iliotibial band syndrome, left leg: Secondary | ICD-10-CM | POA: Diagnosis not present

## 2022-05-14 DIAGNOSIS — E7800 Pure hypercholesterolemia, unspecified: Secondary | ICD-10-CM | POA: Diagnosis not present

## 2022-05-14 DIAGNOSIS — Z9181 History of falling: Secondary | ICD-10-CM | POA: Diagnosis not present

## 2022-05-14 DIAGNOSIS — J45909 Unspecified asthma, uncomplicated: Secondary | ICD-10-CM | POA: Diagnosis not present

## 2022-05-14 DIAGNOSIS — M109 Gout, unspecified: Secondary | ICD-10-CM | POA: Diagnosis not present

## 2022-05-14 DIAGNOSIS — Z471 Aftercare following joint replacement surgery: Secondary | ICD-10-CM | POA: Diagnosis not present

## 2022-05-14 DIAGNOSIS — Z7902 Long term (current) use of antithrombotics/antiplatelets: Secondary | ICD-10-CM | POA: Diagnosis not present

## 2022-05-14 DIAGNOSIS — I1310 Hypertensive heart and chronic kidney disease without heart failure, with stage 1 through stage 4 chronic kidney disease, or unspecified chronic kidney disease: Secondary | ICD-10-CM | POA: Diagnosis not present

## 2022-05-14 DIAGNOSIS — I69351 Hemiplegia and hemiparesis following cerebral infarction affecting right dominant side: Secondary | ICD-10-CM | POA: Diagnosis not present

## 2022-05-14 DIAGNOSIS — N189 Chronic kidney disease, unspecified: Secondary | ICD-10-CM | POA: Diagnosis not present

## 2022-05-14 DIAGNOSIS — I25119 Atherosclerotic heart disease of native coronary artery with unspecified angina pectoris: Secondary | ICD-10-CM | POA: Diagnosis not present

## 2022-05-14 DIAGNOSIS — F32A Depression, unspecified: Secondary | ICD-10-CM | POA: Diagnosis not present

## 2022-05-14 DIAGNOSIS — I7300 Raynaud's syndrome without gangrene: Secondary | ICD-10-CM | POA: Diagnosis not present

## 2022-05-14 DIAGNOSIS — I472 Ventricular tachycardia, unspecified: Secondary | ICD-10-CM | POA: Diagnosis not present

## 2022-05-14 DIAGNOSIS — Z7901 Long term (current) use of anticoagulants: Secondary | ICD-10-CM | POA: Diagnosis not present

## 2022-05-14 DIAGNOSIS — J42 Unspecified chronic bronchitis: Secondary | ICD-10-CM | POA: Diagnosis not present

## 2022-05-14 DIAGNOSIS — Z96651 Presence of right artificial knee joint: Secondary | ICD-10-CM | POA: Diagnosis not present

## 2022-05-14 DIAGNOSIS — J9601 Acute respiratory failure with hypoxia: Secondary | ICD-10-CM | POA: Diagnosis not present

## 2022-05-14 DIAGNOSIS — Z87442 Personal history of urinary calculi: Secondary | ICD-10-CM | POA: Diagnosis not present

## 2022-05-16 DIAGNOSIS — Z471 Aftercare following joint replacement surgery: Secondary | ICD-10-CM | POA: Diagnosis not present

## 2022-05-16 DIAGNOSIS — I1310 Hypertensive heart and chronic kidney disease without heart failure, with stage 1 through stage 4 chronic kidney disease, or unspecified chronic kidney disease: Secondary | ICD-10-CM | POA: Diagnosis not present

## 2022-05-16 DIAGNOSIS — J45909 Unspecified asthma, uncomplicated: Secondary | ICD-10-CM | POA: Diagnosis not present

## 2022-05-16 DIAGNOSIS — F32A Depression, unspecified: Secondary | ICD-10-CM | POA: Diagnosis not present

## 2022-05-16 DIAGNOSIS — M7632 Iliotibial band syndrome, left leg: Secondary | ICD-10-CM | POA: Diagnosis not present

## 2022-05-16 DIAGNOSIS — G43909 Migraine, unspecified, not intractable, without status migrainosus: Secondary | ICD-10-CM | POA: Diagnosis not present

## 2022-05-16 DIAGNOSIS — K219 Gastro-esophageal reflux disease without esophagitis: Secondary | ICD-10-CM | POA: Diagnosis not present

## 2022-05-16 DIAGNOSIS — N189 Chronic kidney disease, unspecified: Secondary | ICD-10-CM | POA: Diagnosis not present

## 2022-05-16 DIAGNOSIS — Z96651 Presence of right artificial knee joint: Secondary | ICD-10-CM | POA: Diagnosis not present

## 2022-05-16 DIAGNOSIS — E7800 Pure hypercholesterolemia, unspecified: Secondary | ICD-10-CM | POA: Diagnosis not present

## 2022-05-16 DIAGNOSIS — I472 Ventricular tachycardia, unspecified: Secondary | ICD-10-CM | POA: Diagnosis not present

## 2022-05-16 DIAGNOSIS — Z9181 History of falling: Secondary | ICD-10-CM | POA: Diagnosis not present

## 2022-05-16 DIAGNOSIS — Z7901 Long term (current) use of anticoagulants: Secondary | ICD-10-CM | POA: Diagnosis not present

## 2022-05-16 DIAGNOSIS — I7300 Raynaud's syndrome without gangrene: Secondary | ICD-10-CM | POA: Diagnosis not present

## 2022-05-16 DIAGNOSIS — I69351 Hemiplegia and hemiparesis following cerebral infarction affecting right dominant side: Secondary | ICD-10-CM | POA: Diagnosis not present

## 2022-05-16 DIAGNOSIS — J9601 Acute respiratory failure with hypoxia: Secondary | ICD-10-CM | POA: Diagnosis not present

## 2022-05-16 DIAGNOSIS — I25119 Atherosclerotic heart disease of native coronary artery with unspecified angina pectoris: Secondary | ICD-10-CM | POA: Diagnosis not present

## 2022-05-16 DIAGNOSIS — J42 Unspecified chronic bronchitis: Secondary | ICD-10-CM | POA: Diagnosis not present

## 2022-05-16 DIAGNOSIS — M109 Gout, unspecified: Secondary | ICD-10-CM | POA: Diagnosis not present

## 2022-05-16 DIAGNOSIS — Z87442 Personal history of urinary calculi: Secondary | ICD-10-CM | POA: Diagnosis not present

## 2022-05-16 DIAGNOSIS — Z7902 Long term (current) use of antithrombotics/antiplatelets: Secondary | ICD-10-CM | POA: Diagnosis not present

## 2022-05-18 DIAGNOSIS — N189 Chronic kidney disease, unspecified: Secondary | ICD-10-CM | POA: Diagnosis not present

## 2022-05-18 DIAGNOSIS — I472 Ventricular tachycardia, unspecified: Secondary | ICD-10-CM | POA: Diagnosis not present

## 2022-05-18 DIAGNOSIS — J45909 Unspecified asthma, uncomplicated: Secondary | ICD-10-CM | POA: Diagnosis not present

## 2022-05-18 DIAGNOSIS — Z96651 Presence of right artificial knee joint: Secondary | ICD-10-CM | POA: Diagnosis not present

## 2022-05-18 DIAGNOSIS — I69351 Hemiplegia and hemiparesis following cerebral infarction affecting right dominant side: Secondary | ICD-10-CM | POA: Diagnosis not present

## 2022-05-18 DIAGNOSIS — Z7901 Long term (current) use of anticoagulants: Secondary | ICD-10-CM | POA: Diagnosis not present

## 2022-05-18 DIAGNOSIS — G43909 Migraine, unspecified, not intractable, without status migrainosus: Secondary | ICD-10-CM | POA: Diagnosis not present

## 2022-05-18 DIAGNOSIS — K219 Gastro-esophageal reflux disease without esophagitis: Secondary | ICD-10-CM | POA: Diagnosis not present

## 2022-05-18 DIAGNOSIS — F32A Depression, unspecified: Secondary | ICD-10-CM | POA: Diagnosis not present

## 2022-05-18 DIAGNOSIS — I25119 Atherosclerotic heart disease of native coronary artery with unspecified angina pectoris: Secondary | ICD-10-CM | POA: Diagnosis not present

## 2022-05-18 DIAGNOSIS — M7632 Iliotibial band syndrome, left leg: Secondary | ICD-10-CM | POA: Diagnosis not present

## 2022-05-18 DIAGNOSIS — Z9181 History of falling: Secondary | ICD-10-CM | POA: Diagnosis not present

## 2022-05-18 DIAGNOSIS — Z7902 Long term (current) use of antithrombotics/antiplatelets: Secondary | ICD-10-CM | POA: Diagnosis not present

## 2022-05-18 DIAGNOSIS — I1310 Hypertensive heart and chronic kidney disease without heart failure, with stage 1 through stage 4 chronic kidney disease, or unspecified chronic kidney disease: Secondary | ICD-10-CM | POA: Diagnosis not present

## 2022-05-18 DIAGNOSIS — I7300 Raynaud's syndrome without gangrene: Secondary | ICD-10-CM | POA: Diagnosis not present

## 2022-05-18 DIAGNOSIS — J42 Unspecified chronic bronchitis: Secondary | ICD-10-CM | POA: Diagnosis not present

## 2022-05-18 DIAGNOSIS — Z87442 Personal history of urinary calculi: Secondary | ICD-10-CM | POA: Diagnosis not present

## 2022-05-18 DIAGNOSIS — M109 Gout, unspecified: Secondary | ICD-10-CM | POA: Diagnosis not present

## 2022-05-18 DIAGNOSIS — E7800 Pure hypercholesterolemia, unspecified: Secondary | ICD-10-CM | POA: Diagnosis not present

## 2022-05-18 DIAGNOSIS — J9601 Acute respiratory failure with hypoxia: Secondary | ICD-10-CM | POA: Diagnosis not present

## 2022-05-18 DIAGNOSIS — Z471 Aftercare following joint replacement surgery: Secondary | ICD-10-CM | POA: Diagnosis not present

## 2022-05-20 DIAGNOSIS — J45909 Unspecified asthma, uncomplicated: Secondary | ICD-10-CM | POA: Diagnosis not present

## 2022-05-20 DIAGNOSIS — Z7901 Long term (current) use of anticoagulants: Secondary | ICD-10-CM | POA: Diagnosis not present

## 2022-05-20 DIAGNOSIS — I1310 Hypertensive heart and chronic kidney disease without heart failure, with stage 1 through stage 4 chronic kidney disease, or unspecified chronic kidney disease: Secondary | ICD-10-CM | POA: Diagnosis not present

## 2022-05-20 DIAGNOSIS — M7632 Iliotibial band syndrome, left leg: Secondary | ICD-10-CM | POA: Diagnosis not present

## 2022-05-20 DIAGNOSIS — I69351 Hemiplegia and hemiparesis following cerebral infarction affecting right dominant side: Secondary | ICD-10-CM | POA: Diagnosis not present

## 2022-05-20 DIAGNOSIS — I472 Ventricular tachycardia, unspecified: Secondary | ICD-10-CM | POA: Diagnosis not present

## 2022-05-20 DIAGNOSIS — K219 Gastro-esophageal reflux disease without esophagitis: Secondary | ICD-10-CM | POA: Diagnosis not present

## 2022-05-20 DIAGNOSIS — Z87442 Personal history of urinary calculi: Secondary | ICD-10-CM | POA: Diagnosis not present

## 2022-05-20 DIAGNOSIS — Z7902 Long term (current) use of antithrombotics/antiplatelets: Secondary | ICD-10-CM | POA: Diagnosis not present

## 2022-05-20 DIAGNOSIS — G43909 Migraine, unspecified, not intractable, without status migrainosus: Secondary | ICD-10-CM | POA: Diagnosis not present

## 2022-05-20 DIAGNOSIS — E7800 Pure hypercholesterolemia, unspecified: Secondary | ICD-10-CM | POA: Diagnosis not present

## 2022-05-20 DIAGNOSIS — J42 Unspecified chronic bronchitis: Secondary | ICD-10-CM | POA: Diagnosis not present

## 2022-05-20 DIAGNOSIS — N189 Chronic kidney disease, unspecified: Secondary | ICD-10-CM | POA: Diagnosis not present

## 2022-05-20 DIAGNOSIS — Z471 Aftercare following joint replacement surgery: Secondary | ICD-10-CM | POA: Diagnosis not present

## 2022-05-20 DIAGNOSIS — F32A Depression, unspecified: Secondary | ICD-10-CM | POA: Diagnosis not present

## 2022-05-20 DIAGNOSIS — I25119 Atherosclerotic heart disease of native coronary artery with unspecified angina pectoris: Secondary | ICD-10-CM | POA: Diagnosis not present

## 2022-05-20 DIAGNOSIS — J9601 Acute respiratory failure with hypoxia: Secondary | ICD-10-CM | POA: Diagnosis not present

## 2022-05-20 DIAGNOSIS — Z9181 History of falling: Secondary | ICD-10-CM | POA: Diagnosis not present

## 2022-05-20 DIAGNOSIS — Z96651 Presence of right artificial knee joint: Secondary | ICD-10-CM | POA: Diagnosis not present

## 2022-05-20 DIAGNOSIS — M109 Gout, unspecified: Secondary | ICD-10-CM | POA: Diagnosis not present

## 2022-05-20 DIAGNOSIS — I7300 Raynaud's syndrome without gangrene: Secondary | ICD-10-CM | POA: Diagnosis not present

## 2022-05-22 DIAGNOSIS — G43909 Migraine, unspecified, not intractable, without status migrainosus: Secondary | ICD-10-CM | POA: Diagnosis not present

## 2022-05-22 DIAGNOSIS — Z96651 Presence of right artificial knee joint: Secondary | ICD-10-CM | POA: Diagnosis not present

## 2022-05-22 DIAGNOSIS — F32A Depression, unspecified: Secondary | ICD-10-CM | POA: Diagnosis not present

## 2022-05-22 DIAGNOSIS — I7300 Raynaud's syndrome without gangrene: Secondary | ICD-10-CM | POA: Diagnosis not present

## 2022-05-22 DIAGNOSIS — Z87442 Personal history of urinary calculi: Secondary | ICD-10-CM | POA: Diagnosis not present

## 2022-05-22 DIAGNOSIS — Z471 Aftercare following joint replacement surgery: Secondary | ICD-10-CM | POA: Diagnosis not present

## 2022-05-22 DIAGNOSIS — M109 Gout, unspecified: Secondary | ICD-10-CM | POA: Diagnosis not present

## 2022-05-22 DIAGNOSIS — Z7902 Long term (current) use of antithrombotics/antiplatelets: Secondary | ICD-10-CM | POA: Diagnosis not present

## 2022-05-22 DIAGNOSIS — I69351 Hemiplegia and hemiparesis following cerebral infarction affecting right dominant side: Secondary | ICD-10-CM | POA: Diagnosis not present

## 2022-05-22 DIAGNOSIS — J45909 Unspecified asthma, uncomplicated: Secondary | ICD-10-CM | POA: Diagnosis not present

## 2022-05-22 DIAGNOSIS — I1310 Hypertensive heart and chronic kidney disease without heart failure, with stage 1 through stage 4 chronic kidney disease, or unspecified chronic kidney disease: Secondary | ICD-10-CM | POA: Diagnosis not present

## 2022-05-22 DIAGNOSIS — K219 Gastro-esophageal reflux disease without esophagitis: Secondary | ICD-10-CM | POA: Diagnosis not present

## 2022-05-22 DIAGNOSIS — M7632 Iliotibial band syndrome, left leg: Secondary | ICD-10-CM | POA: Diagnosis not present

## 2022-05-22 DIAGNOSIS — N189 Chronic kidney disease, unspecified: Secondary | ICD-10-CM | POA: Diagnosis not present

## 2022-05-22 DIAGNOSIS — J42 Unspecified chronic bronchitis: Secondary | ICD-10-CM | POA: Diagnosis not present

## 2022-05-22 DIAGNOSIS — E7800 Pure hypercholesterolemia, unspecified: Secondary | ICD-10-CM | POA: Diagnosis not present

## 2022-05-22 DIAGNOSIS — J9601 Acute respiratory failure with hypoxia: Secondary | ICD-10-CM | POA: Diagnosis not present

## 2022-05-22 DIAGNOSIS — I25119 Atherosclerotic heart disease of native coronary artery with unspecified angina pectoris: Secondary | ICD-10-CM | POA: Diagnosis not present

## 2022-05-22 DIAGNOSIS — Z7901 Long term (current) use of anticoagulants: Secondary | ICD-10-CM | POA: Diagnosis not present

## 2022-05-22 DIAGNOSIS — Z9181 History of falling: Secondary | ICD-10-CM | POA: Diagnosis not present

## 2022-05-22 DIAGNOSIS — I472 Ventricular tachycardia, unspecified: Secondary | ICD-10-CM | POA: Diagnosis not present

## 2022-05-25 DIAGNOSIS — Z7902 Long term (current) use of antithrombotics/antiplatelets: Secondary | ICD-10-CM | POA: Diagnosis not present

## 2022-05-25 DIAGNOSIS — I472 Ventricular tachycardia, unspecified: Secondary | ICD-10-CM | POA: Diagnosis not present

## 2022-05-25 DIAGNOSIS — Z87442 Personal history of urinary calculi: Secondary | ICD-10-CM | POA: Diagnosis not present

## 2022-05-25 DIAGNOSIS — K219 Gastro-esophageal reflux disease without esophagitis: Secondary | ICD-10-CM | POA: Diagnosis not present

## 2022-05-25 DIAGNOSIS — G43909 Migraine, unspecified, not intractable, without status migrainosus: Secondary | ICD-10-CM | POA: Diagnosis not present

## 2022-05-25 DIAGNOSIS — Z9181 History of falling: Secondary | ICD-10-CM | POA: Diagnosis not present

## 2022-05-25 DIAGNOSIS — Z7901 Long term (current) use of anticoagulants: Secondary | ICD-10-CM | POA: Diagnosis not present

## 2022-05-25 DIAGNOSIS — I1310 Hypertensive heart and chronic kidney disease without heart failure, with stage 1 through stage 4 chronic kidney disease, or unspecified chronic kidney disease: Secondary | ICD-10-CM | POA: Diagnosis not present

## 2022-05-25 DIAGNOSIS — N189 Chronic kidney disease, unspecified: Secondary | ICD-10-CM | POA: Diagnosis not present

## 2022-05-25 DIAGNOSIS — I25119 Atherosclerotic heart disease of native coronary artery with unspecified angina pectoris: Secondary | ICD-10-CM | POA: Diagnosis not present

## 2022-05-25 DIAGNOSIS — J42 Unspecified chronic bronchitis: Secondary | ICD-10-CM | POA: Diagnosis not present

## 2022-05-25 DIAGNOSIS — M7632 Iliotibial band syndrome, left leg: Secondary | ICD-10-CM | POA: Diagnosis not present

## 2022-05-25 DIAGNOSIS — Z471 Aftercare following joint replacement surgery: Secondary | ICD-10-CM | POA: Diagnosis not present

## 2022-05-25 DIAGNOSIS — M109 Gout, unspecified: Secondary | ICD-10-CM | POA: Diagnosis not present

## 2022-05-25 DIAGNOSIS — I7300 Raynaud's syndrome without gangrene: Secondary | ICD-10-CM | POA: Diagnosis not present

## 2022-05-25 DIAGNOSIS — Z96651 Presence of right artificial knee joint: Secondary | ICD-10-CM | POA: Diagnosis not present

## 2022-05-25 DIAGNOSIS — E7800 Pure hypercholesterolemia, unspecified: Secondary | ICD-10-CM | POA: Diagnosis not present

## 2022-05-25 DIAGNOSIS — F32A Depression, unspecified: Secondary | ICD-10-CM | POA: Diagnosis not present

## 2022-05-25 DIAGNOSIS — J9601 Acute respiratory failure with hypoxia: Secondary | ICD-10-CM | POA: Diagnosis not present

## 2022-05-25 DIAGNOSIS — J45909 Unspecified asthma, uncomplicated: Secondary | ICD-10-CM | POA: Diagnosis not present

## 2022-05-25 DIAGNOSIS — I69351 Hemiplegia and hemiparesis following cerebral infarction affecting right dominant side: Secondary | ICD-10-CM | POA: Diagnosis not present

## 2022-05-27 DIAGNOSIS — G43909 Migraine, unspecified, not intractable, without status migrainosus: Secondary | ICD-10-CM | POA: Diagnosis not present

## 2022-05-27 DIAGNOSIS — N189 Chronic kidney disease, unspecified: Secondary | ICD-10-CM | POA: Diagnosis not present

## 2022-05-27 DIAGNOSIS — Z96651 Presence of right artificial knee joint: Secondary | ICD-10-CM | POA: Diagnosis not present

## 2022-05-27 DIAGNOSIS — J42 Unspecified chronic bronchitis: Secondary | ICD-10-CM | POA: Diagnosis not present

## 2022-05-27 DIAGNOSIS — I1310 Hypertensive heart and chronic kidney disease without heart failure, with stage 1 through stage 4 chronic kidney disease, or unspecified chronic kidney disease: Secondary | ICD-10-CM | POA: Diagnosis not present

## 2022-05-27 DIAGNOSIS — F32A Depression, unspecified: Secondary | ICD-10-CM | POA: Diagnosis not present

## 2022-05-27 DIAGNOSIS — I472 Ventricular tachycardia, unspecified: Secondary | ICD-10-CM | POA: Diagnosis not present

## 2022-05-27 DIAGNOSIS — Z9181 History of falling: Secondary | ICD-10-CM | POA: Diagnosis not present

## 2022-05-27 DIAGNOSIS — Z7901 Long term (current) use of anticoagulants: Secondary | ICD-10-CM | POA: Diagnosis not present

## 2022-05-27 DIAGNOSIS — K219 Gastro-esophageal reflux disease without esophagitis: Secondary | ICD-10-CM | POA: Diagnosis not present

## 2022-05-27 DIAGNOSIS — J9601 Acute respiratory failure with hypoxia: Secondary | ICD-10-CM | POA: Diagnosis not present

## 2022-05-27 DIAGNOSIS — M109 Gout, unspecified: Secondary | ICD-10-CM | POA: Diagnosis not present

## 2022-05-27 DIAGNOSIS — I69351 Hemiplegia and hemiparesis following cerebral infarction affecting right dominant side: Secondary | ICD-10-CM | POA: Diagnosis not present

## 2022-05-27 DIAGNOSIS — I25119 Atherosclerotic heart disease of native coronary artery with unspecified angina pectoris: Secondary | ICD-10-CM | POA: Diagnosis not present

## 2022-05-27 DIAGNOSIS — Z7902 Long term (current) use of antithrombotics/antiplatelets: Secondary | ICD-10-CM | POA: Diagnosis not present

## 2022-05-27 DIAGNOSIS — I7300 Raynaud's syndrome without gangrene: Secondary | ICD-10-CM | POA: Diagnosis not present

## 2022-05-27 DIAGNOSIS — Z87442 Personal history of urinary calculi: Secondary | ICD-10-CM | POA: Diagnosis not present

## 2022-05-27 DIAGNOSIS — E7800 Pure hypercholesterolemia, unspecified: Secondary | ICD-10-CM | POA: Diagnosis not present

## 2022-05-27 DIAGNOSIS — Z471 Aftercare following joint replacement surgery: Secondary | ICD-10-CM | POA: Diagnosis not present

## 2022-05-27 DIAGNOSIS — J45909 Unspecified asthma, uncomplicated: Secondary | ICD-10-CM | POA: Diagnosis not present

## 2022-05-27 DIAGNOSIS — M7632 Iliotibial band syndrome, left leg: Secondary | ICD-10-CM | POA: Diagnosis not present

## 2022-05-28 DIAGNOSIS — R269 Unspecified abnormalities of gait and mobility: Secondary | ICD-10-CM | POA: Diagnosis not present

## 2022-05-28 DIAGNOSIS — M25661 Stiffness of right knee, not elsewhere classified: Secondary | ICD-10-CM | POA: Diagnosis not present

## 2022-05-28 DIAGNOSIS — M62561 Muscle wasting and atrophy, not elsewhere classified, right lower leg: Secondary | ICD-10-CM | POA: Diagnosis not present

## 2022-05-28 DIAGNOSIS — M25561 Pain in right knee: Secondary | ICD-10-CM | POA: Diagnosis not present

## 2022-06-01 DIAGNOSIS — M62561 Muscle wasting and atrophy, not elsewhere classified, right lower leg: Secondary | ICD-10-CM | POA: Diagnosis not present

## 2022-06-01 DIAGNOSIS — M25561 Pain in right knee: Secondary | ICD-10-CM | POA: Diagnosis not present

## 2022-06-01 DIAGNOSIS — R269 Unspecified abnormalities of gait and mobility: Secondary | ICD-10-CM | POA: Diagnosis not present

## 2022-06-01 DIAGNOSIS — M25661 Stiffness of right knee, not elsewhere classified: Secondary | ICD-10-CM | POA: Diagnosis not present

## 2022-06-02 DIAGNOSIS — M48062 Spinal stenosis, lumbar region with neurogenic claudication: Secondary | ICD-10-CM | POA: Diagnosis not present

## 2022-06-02 DIAGNOSIS — M5416 Radiculopathy, lumbar region: Secondary | ICD-10-CM | POA: Diagnosis not present

## 2022-06-05 DIAGNOSIS — M25661 Stiffness of right knee, not elsewhere classified: Secondary | ICD-10-CM | POA: Diagnosis not present

## 2022-06-05 DIAGNOSIS — M25561 Pain in right knee: Secondary | ICD-10-CM | POA: Diagnosis not present

## 2022-06-05 DIAGNOSIS — R269 Unspecified abnormalities of gait and mobility: Secondary | ICD-10-CM | POA: Diagnosis not present

## 2022-06-05 DIAGNOSIS — M62561 Muscle wasting and atrophy, not elsewhere classified, right lower leg: Secondary | ICD-10-CM | POA: Diagnosis not present

## 2022-06-09 DIAGNOSIS — R269 Unspecified abnormalities of gait and mobility: Secondary | ICD-10-CM | POA: Diagnosis not present

## 2022-06-09 DIAGNOSIS — M25661 Stiffness of right knee, not elsewhere classified: Secondary | ICD-10-CM | POA: Diagnosis not present

## 2022-06-09 DIAGNOSIS — M25561 Pain in right knee: Secondary | ICD-10-CM | POA: Diagnosis not present

## 2022-06-09 DIAGNOSIS — M62561 Muscle wasting and atrophy, not elsewhere classified, right lower leg: Secondary | ICD-10-CM | POA: Diagnosis not present

## 2022-06-15 ENCOUNTER — Emergency Department (HOSPITAL_COMMUNITY): Payer: Medicare Other

## 2022-06-15 ENCOUNTER — Emergency Department (HOSPITAL_COMMUNITY)
Admission: EM | Admit: 2022-06-15 | Discharge: 2022-06-15 | Disposition: A | Discharge status: Home / Self Care | Payer: Medicare Other | Attending: Emergency Medicine | Admitting: Emergency Medicine

## 2022-06-15 ENCOUNTER — Other Ambulatory Visit: Payer: Self-pay

## 2022-06-15 ENCOUNTER — Encounter (HOSPITAL_COMMUNITY): Payer: Self-pay | Admitting: *Deleted

## 2022-06-15 DIAGNOSIS — Z20822 Contact with and (suspected) exposure to covid-19: Secondary | ICD-10-CM | POA: Diagnosis not present

## 2022-06-15 DIAGNOSIS — J029 Acute pharyngitis, unspecified: Secondary | ICD-10-CM

## 2022-06-15 DIAGNOSIS — Z7982 Long term (current) use of aspirin: Secondary | ICD-10-CM | POA: Insufficient documentation

## 2022-06-15 DIAGNOSIS — Z79899 Other long term (current) drug therapy: Secondary | ICD-10-CM | POA: Insufficient documentation

## 2022-06-15 DIAGNOSIS — I2510 Atherosclerotic heart disease of native coronary artery without angina pectoris: Secondary | ICD-10-CM | POA: Insufficient documentation

## 2022-06-15 DIAGNOSIS — J111 Influenza due to unidentified influenza virus with other respiratory manifestations: Secondary | ICD-10-CM | POA: Insufficient documentation

## 2022-06-15 DIAGNOSIS — R6889 Other general symptoms and signs: Secondary | ICD-10-CM

## 2022-06-15 DIAGNOSIS — R0602 Shortness of breath: Secondary | ICD-10-CM | POA: Diagnosis not present

## 2022-06-15 DIAGNOSIS — J449 Chronic obstructive pulmonary disease, unspecified: Secondary | ICD-10-CM | POA: Diagnosis not present

## 2022-06-15 DIAGNOSIS — Z7901 Long term (current) use of anticoagulants: Secondary | ICD-10-CM | POA: Insufficient documentation

## 2022-06-15 DIAGNOSIS — I10 Essential (primary) hypertension: Secondary | ICD-10-CM | POA: Insufficient documentation

## 2022-06-15 LAB — SARS CORONAVIRUS 2 BY RT PCR: SARS Coronavirus 2 by RT PCR: NEGATIVE

## 2022-06-15 LAB — GROUP A STREP BY PCR: Group A Strep by PCR: NOT DETECTED

## 2022-06-15 NOTE — ED Notes (Signed)
Husband at beside. Water and crackers given per request.

## 2022-06-15 NOTE — ED Triage Notes (Signed)
Pt c/o sore throat with sob that started last night

## 2022-06-15 NOTE — Discharge Instructions (Addendum)
Work-up for the sore throat strep negative COVID test negative.  Chest x-ray negative for any signs of pneumonia.  I would recommend extreme Tylenol for the throat pain if the cough gets worse would recommend Mucinex DM.  Return for any new or worse symptoms.

## 2022-06-15 NOTE — ED Notes (Signed)
Pt c/o sore throat with fever x 3 days. A/o. Color wnl. Lips dry but mm moist with appearance of thick saliva. Mild redness in back of throat. States was sob last night because "I felt like it was squeezing in my throat". No oral airway disturbance noted. Nad.

## 2022-06-15 NOTE — ED Provider Notes (Addendum)
[facility] EMERGENCY DEPARTMENT Provider Note   CSN: [id number] Arrival date & time: 06/15/22  3762     History  Chief Complaint  Patient presents with   Sore Throat    Tonya Boyd is a 66 y.o. female.  Patient with a history of sore throat, cough, and some shortness of breath.  Oxygen saturation here 99%.  Temp 99.5.  Patient also felt that she has had fevers.  Past medical history sniffing for lateral hypertension chronic bronchitis coronary artery disease chronic kidney disease COPD.  Past surgical history significant for appendectomy bilateral shoulder surgery coronary angioplasty with stent in 2019 follow-up heart cath in 2020 follow-up heart cath in 2022.  Patient former smoker but not currently.       Home Medications Prior to Admission medications   Medication Sig Start Date End Date Taking? Authorizing Provider  acebutolol (SECTRAL) 200 MG capsule Take 1 capsule (200 mg total) by mouth 2 (two) times daily. 05/12/22   Poggi, Marshall Cork, MD  acetaminophen (TYLENOL) 500 MG tablet Take 1,000 mg by mouth every 8 (eight) hours as needed for moderate pain or headache.    [provider]  apixaban (ELIQUIS) 2.5 MG TABS tablet Take 1 tablet (2.5 mg total) by mouth 2 (two) times daily. 05/13/22   Poggi, Marshall Cork, MD  aspirin EC 81 MG tablet Take 81 mg by mouth daily.    [provider]  cyanocobalamin (,VITAMIN B-12,) 1000 MCG/ML injection Inject 1,000 mcg into the skin every 30 (thirty) days. 01/31/21   [provider]  cyclobenzaprine (FLEXERIL) 10 MG tablet Take 10 mg by mouth 3 (three) times daily as needed for muscle spasms. TAKES 1 TAB AT BEDTIME    [provider]  divalproex (DEPAKOTE ER) 250 MG 24 hr tablet Take 1 tablet (250 mg total) by mouth daily. 02/03/22   Frann Rider, NP  Evolocumab (REPATHA SURECLICK) 831 MG/ML SOAJ PA approved 10/26/2021 through 11/05/2022. Take as prescribed every 2 weeks. 05/12/22   Poggi, Marshall Cork, MD  ezetimibe  (ZETIA) 10 MG tablet Take 1 tablet (10 mg total) by mouth daily. 02/03/22   Cantwell, Celeste C, PA-C  hydrOXYzine (ATARAX/VISTARIL) 25 MG tablet Take 25 mg by mouth 4 (four) times daily as needed for itching. 10/30/19   [provider]  loratadine (CLARITIN) 10 MG tablet Take 10 mg by mouth at bedtime.    [provider]  nitroGLYCERIN (NITROSTAT) 0.4 MG SL tablet DISSOLVE 1 TABLET SUBLINGUALLY AS NEEDED FOR CHEST PAIN, MAY REPEAT EVERY5 MINUTES. AFTER 3 CALL 911. Patient taking differently: Place 0.4 mg under the tongue every 5 (five) minutes as needed for chest pain. 12/30/21   Adrian Prows, MD  ondansetron (ZOFRAN) 4 MG tablet Take 1 tablet (4 mg total) by mouth every 6 (six) hours as needed for nausea or vomiting. 05/13/22   Lattie Corns, PA-C  oxyCODONE (ROXICODONE) 5 MG immediate release tablet Take 1-2 tablets (5-10 mg total) by mouth every 4 (four) hours as needed for moderate pain or severe pain. 05/12/22   Poggi, Marshall Cork, MD  pantoprazole (PROTONIX) 20 MG tablet Take 1 tablet (20 mg total) by mouth daily. 02/03/22   Cantwell, Celeste C, PA-C  traZODone (DESYREL) 50 MG tablet Take 100 mg by mouth at bedtime. 07/15/21   [provider]  Vitamin D, Ergocalciferol, (DRISDOL) 1.25 MG (50000 UT) CAPS capsule Take 50,000 Units by mouth every Monday.    [provider]      Allergies  Adhesive [tape], Brilinta [ticagrelor], Statins, Carvedilol, Diovan [valsartan], Hydrochlorothiazide, Ibuprofen, Keflex [cephalexin], Morphine and related, Norvasc [amlodipine besylate], Nsaids, Prednisone, Reglan [metoclopramide], Singulair [montelukast], and Temazepam    Review of Systems   Review of Systems  Constitutional:  Positive for fever. Negative for chills.  HENT:  Positive for sore throat. Negative for rhinorrhea.   Eyes:  Negative for visual disturbance.  Respiratory:  Positive for cough and shortness of breath.   Cardiovascular:  Negative for chest pain and leg  swelling.  Gastrointestinal:  Negative for abdominal pain, diarrhea, nausea and vomiting.  Genitourinary:  Negative for dysuria.  Musculoskeletal:  Negative for back pain and neck pain.  Skin:  Negative for rash.  Neurological:  Negative for dizziness, light-headedness and headaches.  Hematological:  Does not bruise/bleed easily.  Psychiatric/Behavioral:  Negative for confusion.     Physical Exam Updated Vital Signs BP (!) 151/74   Pulse (!) 102   Temp 99.5 F (37.5 C) (Oral)   Resp 18   Ht 1.638 m (5' 4.5")   Wt 99.3 kg   SpO2 99%   BMI 37.01 kg/m  Physical Exam Vitals and nursing note reviewed.  Constitutional:      General: She is not in acute distress.    Appearance: She is well-developed.  HENT:     Head: Normocephalic and atraumatic.     Mouth/Throat:     Mouth: Mucous membranes are moist. No oral lesions.     Pharynx: Posterior oropharyngeal erythema present. No pharyngeal swelling, oropharyngeal exudate or uvula swelling.     Tonsils: No tonsillar exudate or tonsillar abscesses.  Eyes:     Conjunctiva/sclera: Conjunctivae normal.  Cardiovascular:     Rate and Rhythm: Normal rate and regular rhythm.     Heart sounds: No murmur heard. Pulmonary:     Effort: Pulmonary effort is normal. No respiratory distress.     Breath sounds: Normal breath sounds. No wheezing.  Abdominal:     Palpations: Abdomen is soft.     Tenderness: There is no abdominal tenderness.  Musculoskeletal:        General: No swelling.     Cervical back: Neck supple.  Skin:    General: Skin is warm and dry.     Capillary Refill: Capillary refill takes less than 2 seconds.  Neurological:     Mental Status: She is alert.  Psychiatric:        Mood and Affect: Mood normal.     ED Results / Procedures / Treatments   Labs (all labs ordered are listed, but only abnormal results are displayed) Labs Reviewed  SARS CORONAVIRUS 2 BY RT PCR  GROUP A STREP BY PCR    EKG None  Radiology DG  Chest 2 View  Result Date: 06/15/2022 CLINICAL DATA:  Shortness of breath EXAM: CHEST - 2 VIEW COMPARISON:  04/02/2022 FINDINGS: There is poor inspiration. Transverse diameter of heart is increased. Coronary artery stent is noted. There are no signs of pulmonary edema. There are small linear densities in the lower lung fields. There is no pleural effusion or pneumothorax. There is surgical fusion in cervical spine. IMPRESSION: Small linear densities in lower lung fields suggest minimal scarring or minimal subsegmental atelectasis. There are no signs of pulmonary edema or focal pulmonary consolidation. Coronary artery disease. Electronically Signed   By: Ernie Avena M.D.   On: 06/15/2022 09:33    Procedures Procedures    Medications Ordered in ED Medications - No data to display  ED Course/  Medical Decision Making/ A&P                           Medical Decision Making Amount and/or Complexity of Data Reviewed Radiology: ordered.   COVID testing negative.  Strep testing pending.  Trip test negative.  Chest x-ray no signs of pulmonary edema or focal pulmonary consolidation.  Does show coronary artery disease which is a known finding.  We will treat patient symptomatically.  Final Clinical Impression(s) / ED Diagnoses Final diagnoses:  Flu-like symptoms  Sore throat    Rx / DC Orders ED Discharge Orders     None         Vanetta Mulders, MD 06/15/22 1136    Vanetta Mulders, MD 06/15/22 1251

## 2022-06-15 NOTE — ED Notes (Signed)
Pt ringing bell for second time wanting dr to come in and tell her results. EDP aware

## 2022-06-18 ENCOUNTER — Ambulatory Visit: Payer: Medicare Other | Admitting: Cardiology

## 2022-06-19 ENCOUNTER — Ambulatory Visit: Payer: Medicare Other | Admitting: Student

## 2022-06-23 DIAGNOSIS — M62561 Muscle wasting and atrophy, not elsewhere classified, right lower leg: Secondary | ICD-10-CM | POA: Diagnosis not present

## 2022-06-23 DIAGNOSIS — M25661 Stiffness of right knee, not elsewhere classified: Secondary | ICD-10-CM | POA: Diagnosis not present

## 2022-06-23 DIAGNOSIS — R269 Unspecified abnormalities of gait and mobility: Secondary | ICD-10-CM | POA: Diagnosis not present

## 2022-06-23 DIAGNOSIS — M25561 Pain in right knee: Secondary | ICD-10-CM | POA: Diagnosis not present

## 2022-06-25 DIAGNOSIS — M62561 Muscle wasting and atrophy, not elsewhere classified, right lower leg: Secondary | ICD-10-CM | POA: Diagnosis not present

## 2022-06-25 DIAGNOSIS — R269 Unspecified abnormalities of gait and mobility: Secondary | ICD-10-CM | POA: Diagnosis not present

## 2022-06-25 DIAGNOSIS — M25661 Stiffness of right knee, not elsewhere classified: Secondary | ICD-10-CM | POA: Diagnosis not present

## 2022-06-25 DIAGNOSIS — M25561 Pain in right knee: Secondary | ICD-10-CM | POA: Diagnosis not present

## 2022-06-26 DIAGNOSIS — M1711 Unilateral primary osteoarthritis, right knee: Secondary | ICD-10-CM | POA: Diagnosis not present

## 2022-06-26 DIAGNOSIS — Z96651 Presence of right artificial knee joint: Secondary | ICD-10-CM | POA: Diagnosis not present

## 2022-06-29 NOTE — Progress Notes (Signed)
[facility] [address] [place]. [place] [address] [phone]       OFFICE FOLLOW UP NOTE  Tonya Boyd Date of Birth:  06/11/1956 Medical Record Number:  [id number]   Primary neurologist: Dr. Pearlean Brownie Reason for Referral: Stroke like episode, headaches   Chief Complaint  Patient presents with   follow up stroke    Pt is feeling okay. No questions or concerns. Room 2 alone       HPI:   Update 06/30/2022 JM: Patient returns for yearly follow-up visit unaccompanied.  Overall stable from neurological standpoint without any new neurological symptoms.  Reports continued mild right-sided weakness and speech difficulty.  She remains on Social Security disability.  Remains on Depakote ER 250 mg daily for headache prophylaxis, has not experienced any recent headaches.  Compliant on aspirin, Repatha and Zetia.  Blood pressure well controlled. S/p R total knee arthroplasty back in September, recovering well and feels like her right leg is stronger now, closely being followed by orthopedics.  She has been enjoying going to the senior center twice weekly as well as more recently doing ceramic paintings and crocheting.  No new concerns at this time.    History provided for reference purposes only Update 06/30/2021 JM: Returns for overdue 34-month follow-up unaccompanied. She missed prior visit due to having COVID in August - recovered well. Overall stable from neurological standpoint without new neurological symptoms.  She reports continued occasional right arm weakness (difficulty gripping) and occasional pain. Recently obtained R AFO brace for right foot weakness which she reports has been ongoing since her stroke. Did have a right foot fracture 12/2020 but feels like she recovered well form this. Routinely followed by ortho for right knee pain. Continues to use cane at all times - denies any recent falls. She remains on social security disability. Remains on Depakote ER  250 mg daily - denies side effects. She has only had 2-3 headaches since prior visit. Will improve after closing eyes and use of ice. Compliant on aspirin, Repatha and Zetia without side effects as well as plavix for s/p proximal RCA and ostial PDA stenting 04/08/2021 by Dr. Jacinto Halim.  Continues to be routinely followed by cardiology. Blood pressure today 150/90. Is not currently monitoring at home. Medications changes recently due to hypotension.  No further concerns at this time.  Update 11/04/2020 JM: Tonya Boyd returns for scheduled 78-month follow-up.  Doing well from a stroke standpoint without new or reoccurring stroke/TIA symptoms Residual right-sided weakness, speech impairment and cognitive impairment stable  Reports compliance on aspirin 81 mg daily without any bruising Reports compliance on Repatha and cardiology recently initiated Zetia 10 mg daily -denies side effects and plans on repeating lipid panel in April with PCP Blood pressure today 144/89  On Depakote ER 250 mg daily for migraine prophylaxis She will occassionally have migraines which have slightly worsened over the past few months likely in setting of family stressors including the passing of her brother at the end of January after being told of a cancer diagnosis in December as well as putting her dog down.  She does have family support as well as close friends and very active in her church.  No new concerns at this time.  Update 05/07/2020 JM: Tonya Boyd returns for stroke like episode follow-up unaccompanied Continues to experience right hemiparesis, speech impairment and cognitive deficit which have been stable She did have formal neurocognitive evaluation by Dr. 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  Reported changes to speech and language, reduced encoding and new learning, slowed processing speed, lateralized motor deficits and memory dysfunction are likely residual symptoms of  prior infarct. Denies new stroke/TIA symptoms Continues on aspirin 81 mg daily and Repatha for secondary stroke prevention without side effects Dr. Jacinto Halim manages repatha and did recently have lipid panel completed with LDL 64 Blood pressure today 140/85 -monitors at home which has been stable Remains on Depakote ER 250 mg daily for migraine prophylaxis.  She denies any recent migraine occurrence. Does have have occasional mild headaches but typically associated with motion Was having increased headaches likely due to elevated blood pressure and after medication adjustments by Dr. Jacinto Halim, blood pressures improved and worsening headaches resolved No further concerns at this time  Virtual visit 01/01/2020 JM: Tonya Boyd is is being seen via virtual visit for follow-up regarding prior strokelike episode in 06/2019.  Residual symptoms of right sided weakness, speech impairment with childlike quality and cognitive deficit which have been stable without worsening. Evaluated by Dr. Kieth Brightly on 12/26/2019 and plans on undergoing neurocognitive evaluation today.  Continues on Depakote 250 mg daily for ongoing migraine prophylaxis with benefit.  Previously on 500 mg dosage and denies any worsening with lowering dose.  Mild occasional headaches but denies migrainous headaches.  Continues on aspirin 81 mg daily and Repatha for stroke prevention.  Continues to follow with Dr. Jacinto Halim, cardiology, routinely.  No concerns at this time.  Update 08/15/2019: Tonya Boyd is a 66 year old female who is being seen today for follow-up accompanied by her husband.  She continues to have speech difficulty with hesitancy and childlike quality.  She also continues to complain of cognition and memory difficulties with getting confused easily as well as subjective right-sided weakness and numbness.  She continues to ambulate with a cane and does endorse a couple falls due to tripping on her right foot due to reported weakness.  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  Currently on short-term disability by PCP and plans on pursuing disability in the near future.  She has since completed therapies with reports of improvement of prior deficits.  After prior visit, underwent EEG which was normal. She was started on Depakote 500 mg daily at prior visit due to potential complicated migraine versus seizure activity which she continues on without difficulty.  She denies reoccurring headaches or worsening of symptoms.  She does endorse occasional depression or sadness which has been more present thinking of her daughter that passed away 5 years ago.  She also endorses increased stress/anxiety at times that can worsen her cognition and speech.  She continues on aspirin 81 mg daily without bleeding or bruising.  Previously prescribed Repatha but due to joint pain, this has been discontinued and plans on following with cardiology for initiation of a different type of cholesterol lowering agent.    Continues to follow with cardiology regularly for HTN and HLD management.  Blood pressure today 125/82.  No further concerns at this time.    Initial visit 06/15/2019 Dr. Pearlean Brownie: Tonya Boyd is a 66 year old Caucasian lady seen today for initial office consultation visit.  She is accompanied by her husband.  History is obtained from them, review of electronic medical records and I personally reviewed imaging films in PACS.  She has past medical history of hypertension, hyperlipidemia, coronary artery disease, kidney stones, hiatal hernia, gastroesophageal reflux disease, chronic bronchitis.  She states  she had episode in early September when she woke up she felt she could not think right and she had brain fog she was off balance numbness on the right side of the face and tongue as well as right hand she is went and saw her primary care physician who ordered an outpatient MRI scan of the brain done on  05/17/2019 at [facility] which I personally reviewed showed no acute abnormalities.  Mild changes of chronic small vessel disease only noted.  She was seen in the ER on 06/08/2019 with sudden onset of speech change and right-sided weakness.  She also had some chest pain in the morning and took some nitroglycerin.  She also had a headache.  She states she does not get many headaches.  CT scan of the head in the ER was unremarkable.  She had some subjective speech difficulties and right-sided weakness with giveaway weakness as per Dr. Alene Mires exam.  MRI scan of the brain was obtained which showed no acute abnormality and only changes of small vessel disease.  MRI of the brain showed no significant large vessel stenosis..  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   Patient states symptoms have persisted since then.  She is able to speak now but has trouble speaking and speaks in with a childlike quality to her speech.  Still has subjective weakness and numbness on the right side but is able to walk without assistance.  She states she cannot think because she has brain fog and will not be able to return to work.  She feels better.  She denies any prior known history of strokes TIAs or seizures.  She does take aspirin every day and is on medication for blood pressure and cholesterol for her coronary artery disease.  She denies prior history of migraines or headaches with neurological symptoms.  She denies any significant ongoing stress in her life.  Her daughter died 5 years ago but she states she is coping with that.  Patient feels she is unable to work because she gets confused and the brain cannot think right.  She has in fact not even been driving.      ROS:   14 system review of systems is positive for those listed in HPI, all other systems negative   PMH:  Past Medical History:  Diagnosis Date   Angina pectoris (HCC)    Anxiety    Asthma    CAD (coronary artery  disease), native coronary artery 08/02/2018   a.) [facility] 08/02/2018: EF 45%, super-dominant large RCA, 80% p-mRCA, 50% oRPDA, 80% D1; mPA 13, mPCWP 8, PA sat 81%, Ao sat 99, CO 6.95, CI 3.65 --> 4.0 x 30 mm Orsiro DES x1 to [facility]; b.) [facility] 10/07/2018: 50-60% oD1 (FFR 0.96), diffuse HG stenosis prox seg very small (<1 mm) RI --> med mgmt; c.) [facility] 04/08/2021: 80% pRCA (4.0 x 16 mm Synergy XD DES) and 80% oPDA (2.5 x 12 mm Synergy XD DES)   Chronic bronchitis (HCC)    Chronic kidney disease    Complication of anesthesia    a.) postoperative nausea only (no vomiting); also "shakes"; per patient relieved w/ IV diphenhydramine   COPD (chronic obstructive pulmonary disease) (HCC)    Depression    Diastolic dysfunction 07/23/2018   a.) TTE 07/23/2018: EF 55-60%, GLS -19.6%, G1DD; b.) TTE 11/01/2020: EF 55%, triv AR, G1DD   Family history of adverse reaction to anesthesia    "cousin stopped breathing; he was allergic  to the anesthesia" (08/02/2018)   GERD (gastroesophageal reflux disease)    High cholesterol    History of gout    History of hiatal hernia    History of kidney stones    Hypertension 07/2018   Interstitial cystitis    Long term current use of antithrombotics/antiplatelets    a.) daily DAPT therapy (ASA + clopidogrel)   Migraines    NSVT (nonsustained ventricular tachycardia) ([place]) 07/2018   a.) holter 07/13/2018: 6 beat run NSVT; maximum rate 133 bpm.   OA (osteoarthritis)    Raynaud's disease    Stroke-like episode 06/08/2019   a.) speech difficulty, right sided weakness/paraesthesias; unclear etiology ?? possibly conversion disorder. MRI brain x 2 negative for stroke.    Social History:  Social History   Socioeconomic History   Marital status: Married    Spouse name: Not on file   Number of children: 3   Years of education: GED   Highest education level: Not on file  Occupational History    Employer: [employer]  Tobacco Use   Smoking status: Never    Smokeless tobacco: Never  Vaping Use   Vaping Use: Never used  Substance and Sexual Activity   Alcohol use: Not Currently    Comment: 08/02/2018 "not since my 20's"   Drug use: Never   Sexual activity: Not Currently  Other Topics Concern   Not on file  Social History Narrative   One child deceased   Social Determinants of Health   Financial Resource Strain: Not on file  Food Insecurity: No Food Insecurity (05/12/2022)   Hunger Vital Sign    Worried About Running Out of Food in the Last Year: Never true    Ran Out of Food in the Last Year: Never true  Transportation Needs: No Transportation Needs (05/12/2022)   PRAPARE - Hydrologist (Medical): No    Lack of Transportation (Non-Medical): No  Physical Activity: Not on file  Stress: Not on file  Social Connections: Not on file  Intimate Partner Violence: Not At Risk (05/12/2022)   Humiliation, Afraid, Rape, and Kick questionnaire    Fear of Current or Ex-Partner: No    Emotionally Abused: No    Physically Abused: No    Sexually Abused: No    Medications:   Current Outpatient Medications on File Prior to Visit  Medication Sig Dispense Refill   acebutolol (SECTRAL) 200 MG capsule Take 1 capsule (200 mg total) by mouth 2 (two) times daily.     acetaminophen (TYLENOL) 500 MG tablet Take 1,000 mg by mouth every 8 (eight) hours as needed for moderate pain or headache.     apixaban (ELIQUIS) 2.5 MG TABS tablet Take 1 tablet (2.5 mg total) by mouth 2 (two) times daily. 30 tablet 0   aspirin EC 81 MG tablet Take 81 mg by mouth daily.     cyanocobalamin (,VITAMIN B-12,) 1000 MCG/ML injection Inject 1,000 mcg into the skin every 30 (thirty) days.     cyclobenzaprine (FLEXERIL) 10 MG tablet Take 10 mg by mouth 3 (three) times daily as needed for muscle spasms. TAKES 1 TAB AT BEDTIME     divalproex (DEPAKOTE ER) 250 MG 24 hr tablet Take 1 tablet (250 mg total) by mouth daily. 90 tablet 3   Evolocumab (REPATHA  SURECLICK) 035 MG/ML SOAJ PA approved 10/26/2021 through 11/05/2022. Take as prescribed every 2 weeks.     ezetimibe (ZETIA) 10 MG tablet Take 1 tablet (10 mg  total) by mouth daily. 90 tablet 3   hydrOXYzine (ATARAX/VISTARIL) 25 MG tablet Take 25 mg by mouth 4 (four) times daily as needed for itching.     loratadine (CLARITIN) 10 MG tablet Take 10 mg by mouth at bedtime.     nitroGLYCERIN (NITROSTAT) 0.4 MG SL tablet DISSOLVE 1 TABLET SUBLINGUALLY AS NEEDED FOR CHEST PAIN, MAY REPEAT EVERY5 MINUTES. AFTER 3 CALL 911. (Patient taking differently: Place 0.4 mg under the tongue every 5 (five) minutes as needed for chest pain.) 25 tablet 0   ondansetron (ZOFRAN) 4 MG tablet Take 1 tablet (4 mg total) by mouth every 6 (six) hours as needed for nausea or vomiting. 30 tablet 0   oxyCODONE (ROXICODONE) 5 MG immediate release tablet Take 1-2 tablets (5-10 mg total) by mouth every 4 (four) hours as needed for moderate pain or severe pain. 40 tablet 0   pantoprazole (PROTONIX) 20 MG tablet Take 1 tablet (20 mg total) by mouth daily. 90 tablet 3   traZODone (DESYREL) 50 MG tablet Take 100 mg by mouth at bedtime.     Vitamin D, Ergocalciferol, (DRISDOL) 1.25 MG (50000 UT) CAPS capsule Take 50,000 Units by mouth every Monday.     No current facility-administered medications on file prior to visit.    Allergies:   Allergies  Allergen Reactions   Adhesive [Tape] Other (See Comments)    SKIN WILL TEAR EASILY!!!!   Brilinta [Ticagrelor] Shortness Of Breath   Statins Other (See Comments)    myalgia   Carvedilol     Unknown reaction    Diovan [Valsartan]     Tingling    Hydrochlorothiazide     Unknown reaction    Ibuprofen     Causes bp to go up   Keflex [Cephalexin]     confusion   Morphine And Related     Hyper, ineffective    Norvasc [Amlodipine Besylate]     Numbness and tingling    Nsaids     Esophagus became red and swollen (tolerates meloxicam)    Prednisone     Elevated BP   Reglan  [Metoclopramide]     Confusion, altered mental status   Singulair [Montelukast] Diarrhea   Temazepam Hives       Physical Exam Today's Vitals   06/30/22 1031  BP: 135/80  Pulse: 93  Weight: 212 lb 6 oz (96.3 kg)  Height: 5' 4.5" (1.638 m)   Body mass index is 35.89 kg/m.   General: well developed, well nourished,  pleasant middle-aged Caucasian female, seated, in no evident distress Head: head normocephalic and atraumatic.   Neck: supple with no carotid or supraclavicular bruits Cardiovascular: regular rate and rhythm, no murmurs Musculoskeletal: no deformity Skin: Surgical scar right knee Vascular:  Normal pulses all extremities   Neurologic Exam Mental Status: Awake and fully alert. Childlike quality of speech but no evidence of true dysarthria or aphasia.  Speech fluctuates during visit.  Oriented to place and time. Recent memory impaired and remote memory intact. Attention span, concentration and fund of knowledge appropriate during visit. Mood and affect appropriate.  Cranial Nerves: Pupils equal, briskly reactive to light. Extraocular movements full without nystagmus. Visual fields full to confrontation. Hearing intact. Facial sensation intact. Face, tongue, palate moves normally and symmetrically.  Motor: Normal bulk and tone.  Normal strength left upper and lower extremity.  Unable to appreciate weakness on exam although difficulty fully assessing right upper and lower extremity due to poor effort and giveaway weakness Sensory.:  Diminished touch, pinprick, position and vibratory sensation on right hemibody including forehead with splitting of midline Coordination: Rapid alternating movements normal in all extremities. Finger-to-nose and heel-to-shin performed accurately bilaterally.  Gait and Station: Arises from chair without difficulty and without assistance. Stance is normal. Gait demonstrates normal stride length and balance with mild favoring of RLE due to recent knee  procedure and use of rolling walker. Reflexes: 1+ and symmetric. Toes downgoing.        ASSESSMENT/PLAN: [age] year patient with speech difficulty, right sided weakness and numbness from stroke like episode of unclear etiology possibly conversion disorder. MRI brain x 2 negative for stroke. Exam shows nonorganic features with subjective right-sided weakness, giveaway weakness, sensory impairment, abnormal speech and cognitive impairment. Vascular risk factors of chronic migraines, CAD s/p angioplasty to right RCA 07/2018 and s/p stent proximal right RCA and ostial PDA, HTN and HLD with statin intolerance.     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 -Continue aspirin 81 mg daily and Repatha and zetia for secondary stroke prevention -Continue to follow PCP/cardiology for aggressive stroke risk factor management including HTN with BP goal<130/90 and HLD with LDL goal<70  Migraines -Continue Depakote ER 250 mg daily for migraine prophylaxis -refill provided     Follow-up in 1 year or earlier if needed   CC:  Georgianne Fickamachandran, Ajith, MD    I spent 31 minutes of face-to-face and non-face-to-face time with patient.  This included previsit chart review, lab review, study review, order entry, electronic health record documentation, patient education regarding strokelike episode vs conversion disorder with residual deficits, managing stroke risk factors, ongoing use of Depakote for migraine prophylaxis and answered all other questions to patient satisfaction   Ihor AustinJessica McCue, AGNP-BC  [facility] [address] [place], [place] [id number]  Phone [phone] Fax [phone] Note: This document was prepared with digital dictation and possible smart phrase technology. Any transcriptional errors that result from this process are unintentional.

## 2022-06-30 ENCOUNTER — Ambulatory Visit: Payer: Medicare Other | Admitting: Adult Health

## 2022-06-30 ENCOUNTER — Encounter: Payer: Self-pay | Admitting: Adult Health

## 2022-06-30 VITALS — BP 135/80 | HR 93 | Ht 64.5 in | Wt 212.4 lb

## 2022-06-30 DIAGNOSIS — R2990 Unspecified symptoms and signs involving the nervous system: Secondary | ICD-10-CM

## 2022-06-30 DIAGNOSIS — G43009 Migraine without aura, not intractable, without status migrainosus: Secondary | ICD-10-CM | POA: Diagnosis not present

## 2022-06-30 MED ORDER — DIVALPROEX SODIUM ER 250 MG PO TB24: 250.0000 mg | ORAL_TABLET | Freq: Every day | ORAL | 3 refills | Status: DC

## 2022-06-30 NOTE — Patient Instructions (Addendum)
Continue Depakote ER 250mg  daily for headache prevention  Continue aspirin 81 mg daily  and Repatha and Zetia for secondary stroke prevention and per cardiology recommendations  Continue to follow up with PCP regarding blood pressure and cholesterol management  Maintain strict control of hypertension with blood pressure goal below 130/90 and cholesterol with LDL cholesterol (bad cholesterol) goal below 70 mg/dL.   Signs of a Stroke? Follow the BEFAST method:  Balance Watch for a sudden loss of balance, trouble with coordination or vertigo Eyes Is there a sudden loss of vision in one or both eyes? Or double vision?  Face: Ask the person to smile. Does one side of the face droop or is it numb?  Arms: Ask the person to raise both arms. Does one arm drift downward? Is there weakness or numbness of a leg? Speech: Ask the person to repeat a simple phrase. Does the speech sound slurred/strange? Is the person confused ? Time: If you observe any of these signs, call 911.      Followup in the future with me in 1 year or call earlier if needed      Thank you for coming to see [place] at [facility]. I hope we have been able to provide you high quality care today.  You may receive a patient satisfaction survey over the next few weeks. We would appreciate your feedback and comments so that we may continue to improve ourselves and the health of our patients.

## 2022-07-01 DIAGNOSIS — M25661 Stiffness of right knee, not elsewhere classified: Secondary | ICD-10-CM | POA: Diagnosis not present

## 2022-07-01 DIAGNOSIS — R269 Unspecified abnormalities of gait and mobility: Secondary | ICD-10-CM | POA: Diagnosis not present

## 2022-07-01 DIAGNOSIS — M62561 Muscle wasting and atrophy, not elsewhere classified, right lower leg: Secondary | ICD-10-CM | POA: Diagnosis not present

## 2022-07-01 DIAGNOSIS — M25561 Pain in right knee: Secondary | ICD-10-CM | POA: Diagnosis not present

## 2022-07-03 DIAGNOSIS — M25661 Stiffness of right knee, not elsewhere classified: Secondary | ICD-10-CM | POA: Diagnosis not present

## 2022-07-03 DIAGNOSIS — M62561 Muscle wasting and atrophy, not elsewhere classified, right lower leg: Secondary | ICD-10-CM | POA: Diagnosis not present

## 2022-07-03 DIAGNOSIS — R269 Unspecified abnormalities of gait and mobility: Secondary | ICD-10-CM | POA: Diagnosis not present

## 2022-07-03 DIAGNOSIS — M25561 Pain in right knee: Secondary | ICD-10-CM | POA: Diagnosis not present

## 2022-07-09 DIAGNOSIS — J320 Chronic maxillary sinusitis: Secondary | ICD-10-CM | POA: Diagnosis not present

## 2022-07-13 ENCOUNTER — Ambulatory Visit: Payer: Medicare Other | Admitting: Cardiology

## 2022-07-14 DIAGNOSIS — R269 Unspecified abnormalities of gait and mobility: Secondary | ICD-10-CM | POA: Diagnosis not present

## 2022-07-14 DIAGNOSIS — M25661 Stiffness of right knee, not elsewhere classified: Secondary | ICD-10-CM | POA: Diagnosis not present

## 2022-07-14 DIAGNOSIS — M25561 Pain in right knee: Secondary | ICD-10-CM | POA: Diagnosis not present

## 2022-07-14 DIAGNOSIS — M62561 Muscle wasting and atrophy, not elsewhere classified, right lower leg: Secondary | ICD-10-CM | POA: Diagnosis not present

## 2022-07-16 ENCOUNTER — Other Ambulatory Visit: Payer: Self-pay | Admitting: Cardiology

## 2022-07-16 DIAGNOSIS — M25661 Stiffness of right knee, not elsewhere classified: Secondary | ICD-10-CM | POA: Diagnosis not present

## 2022-07-16 DIAGNOSIS — M25561 Pain in right knee: Secondary | ICD-10-CM | POA: Diagnosis not present

## 2022-07-16 DIAGNOSIS — R269 Unspecified abnormalities of gait and mobility: Secondary | ICD-10-CM | POA: Diagnosis not present

## 2022-07-16 DIAGNOSIS — M62561 Muscle wasting and atrophy, not elsewhere classified, right lower leg: Secondary | ICD-10-CM | POA: Diagnosis not present

## 2022-07-17 DIAGNOSIS — R7303 Prediabetes: Secondary | ICD-10-CM | POA: Diagnosis not present

## 2022-07-17 DIAGNOSIS — I10 Essential (primary) hypertension: Secondary | ICD-10-CM | POA: Diagnosis not present

## 2022-07-17 DIAGNOSIS — I700 Atherosclerosis of aorta: Secondary | ICD-10-CM | POA: Diagnosis not present

## 2022-07-17 DIAGNOSIS — I25118 Atherosclerotic heart disease of native coronary artery with other forms of angina pectoris: Secondary | ICD-10-CM | POA: Diagnosis not present

## 2022-07-17 DIAGNOSIS — E782 Mixed hyperlipidemia: Secondary | ICD-10-CM | POA: Diagnosis not present

## 2022-07-20 DIAGNOSIS — R269 Unspecified abnormalities of gait and mobility: Secondary | ICD-10-CM | POA: Diagnosis not present

## 2022-07-20 DIAGNOSIS — M62561 Muscle wasting and atrophy, not elsewhere classified, right lower leg: Secondary | ICD-10-CM | POA: Diagnosis not present

## 2022-07-20 DIAGNOSIS — M25561 Pain in right knee: Secondary | ICD-10-CM | POA: Diagnosis not present

## 2022-07-20 DIAGNOSIS — M25661 Stiffness of right knee, not elsewhere classified: Secondary | ICD-10-CM | POA: Diagnosis not present

## 2022-07-21 ENCOUNTER — Encounter: Payer: Self-pay | Admitting: Cardiology

## 2022-07-21 ENCOUNTER — Ambulatory Visit: Payer: Medicare Other | Admitting: Cardiology

## 2022-07-21 VITALS — BP 136/82 | HR 84 | Temp 98.5°F | Resp 16 | Ht 64.0 in | Wt 209.0 lb

## 2022-07-21 DIAGNOSIS — I25118 Atherosclerotic heart disease of native coronary artery with other forms of angina pectoris: Secondary | ICD-10-CM

## 2022-07-21 DIAGNOSIS — E7800 Pure hypercholesterolemia, unspecified: Secondary | ICD-10-CM

## 2022-07-21 DIAGNOSIS — I10 Essential (primary) hypertension: Secondary | ICD-10-CM | POA: Diagnosis not present

## 2022-07-21 MED ORDER — LOSARTAN POTASSIUM 25 MG PO TABS: 25.0000 mg | ORAL_TABLET | Freq: Every evening | ORAL | 2 refills | Status: DC

## 2022-07-21 MED ORDER — PANTOPRAZOLE SODIUM 20 MG PO TBEC: 20.0000 mg | DELAYED_RELEASE_TABLET | Freq: Every day | ORAL | 3 refills | Status: DC

## 2022-07-21 MED ORDER — ACEBUTOLOL HCL 200 MG PO CAPS: 200.0000 mg | ORAL_CAPSULE | Freq: Two times a day (BID) | ORAL | 3 refills | Status: DC

## 2022-07-21 NOTE — Progress Notes (Signed)
Primary Physician/Referring:  Merrilee Seashore, MD  Patient ID: Tonya Boyd, female    DOB: [birth year], 66 y.o.   MRN: [id number]  Chief Complaint  Patient presents with   Coronary Artery Disease   Hyperlipidemia   Follow-up    6 month    HPI:    Tonya Boyd  is a 66 y.o. ffemale with raynaud's disease and GERD, CAD SP angioplasty to RCA on 08/02/2018 and again in 2022 for stenosis in the proximal segment of the previous stent, hyperlipidemia, has not been able to tolerate statins now on Repatha, hypertension presents here for 66-monthoffice visit.  She remains asymptomatic.  Past Medical History:  Diagnosis Date   Angina pectoris ([facility]    Anxiety    Asthma    CAD (coronary artery disease), native coronary artery 08/02/2018   [facility] EF 45%, super-dominant large RCA, 80% p-mRCA, 50% oRPDA, 80% D1; mPA 13, mPCWP 8, PA sat 81%, Ao sat 99, CO 6.95, CI 3.65 --> 4.0 x 30 [facility] b.) LHC 10/07/2018: 50-60% oD1 (FFR 0.96), diffuse HG stenosis prox seg very small (<1 mm) RI --> med mgmt; c.) LHC 04/08/2021: 80% pRCA (4.0 x 16 mm Synergy XD DES) and 80% oPDA (2.5 x 12 mm Synergy XD DES)   Chronic bronchitis (HCC)    Chronic kidney disease    Complication of anesthesia    a.) postoperative nausea only (no vomiting); also "shakes"; per patient relieved w/ IV diphenhydramine   COPD (chronic obstructive pulmonary disease) ([place]    Depression    Diastolic dysfunction 146/27/0350  a.) TTE 07/23/2018: EF 55-60%, GLS -19.6%, G1DD; b.) TTE 11/01/2020: EF 55%, triv AR, G1DD   Family history of adverse reaction to anesthesia    "cousin stopped breathing; he was allergic to the anesthesia" (08/02/2018)   GERD (gastroesophageal reflux disease)    High cholesterol    History of gout    History of hiatal hernia    History of kidney stones    Hypertension 07/2018   Interstitial cystitis    Long term current use of antithrombotics/antiplatelets    a.)  daily DAPT therapy (ASA + clopidogrel)   Migraines    NSVT (nonsustained ventricular tachycardia) [place] 07/2018   a.) holter 07/13/2018: 6 beat run NSVT; maximum rate 133 bpm.   OA (osteoarthritis)    Raynaud's disease    Stroke-like episode 06/08/2019   a.) speech difficulty, right sided weakness/paraesthesias; unclear etiology ?? possibly conversion disorder. MRI brain x 2 negative for stroke.   Past Surgical History:  Procedure Laterality Date   ABDOMINAL HYSTERECTOMY     ANTERIOR CERVICAL DECOMP/DISCECTOMY FUSION     APPENDECTOMY     AUGMENTATION MAMMAPLASTY Bilateral    BACK SURGERY     CORONARY ANGIOPLASTY WITH STENT PLACEMENT  08/02/2018   CORONARY STENT INTERVENTION N/A 08/02/2018   Procedure: CORONARY STENT INTERVENTION;  Surgeon: GAdrian Prows MD;  [facility];  Service: Cardiovascular;  Laterality: N/A;  RCA    CORONARY STENT INTERVENTION N/A 04/08/2021   Procedure: CORONARY STENT INTERVENTION;  Surgeon: GAdrian Prows MD;  [facility];  Service: Cardiovascular;  Laterality: N/A;  RCA and PDA   CYSTOSCOPY W/ STONE MANIPULATION     INTRAVASCULAR PRESSURE WIRE/FFR STUDY N/A 10/07/2018   Procedure: INTRAVASCULAR PRESSURE WIRE/FFR STUDY;  Surgeon: GAdrian Prows MD;  [place] [facility];  Service: Cardiovascular;  Laterality: N/A;   KNEE ARTHROSCOPY Right  LAPAROSCOPIC CHOLECYSTECTOMY     LEFT HEART CATH AND CORONARY ANGIOGRAPHY N/A 10/07/2018   Procedure: LEFT HEART CATH AND CORONARY ANGIOGRAPHY;  Surgeon: Adrian Prows, MD;  Location: [place] [facility];  Service: Cardiovascular;  Laterality: N/A;   LEFT HEART CATH AND CORONARY ANGIOGRAPHY N/A 04/08/2021   Procedure: LEFT HEART CATH AND CORONARY ANGIOGRAPHY;  Surgeon: Adrian Prows, MD;  Location: [place] [facility];  Service: Cardiovascular;  Laterality: N/A;   REPAIR ANKLE LIGAMENT Left    "tied up ligament; had tore up qthing in my ankle"   REPLACEMENT TOTAL KNEE Right    RIGHT/LEFT HEART  CATH AND CORONARY ANGIOGRAPHY N/A 08/02/2018   Procedure: RIGHT/LEFT HEART CATH AND CORONARY ANGIOGRAPHY;  Surgeon: Adrian Prows, MD;  Location: [place] [facility];  Service: Cardiovascular;  Laterality: N/A;   SHOULDER SURGERY Bilateral    TONSILLECTOMY     TOTAL KNEE ARTHROPLASTY Right 05/12/2022   Procedure: TOTAL KNEE ARTHROPLASTY;  Surgeon: Corky Mull, MD;  Location: [facility];  Service: Orthopedics;  Laterality: Right;   TUBAL LIGATION     Family History  Problem Relation Age of Onset   Kidney failure Mother    Diabetes Mother    Heart disease Father    Heart attack Father    Colon cancer Brother    Liver cancer Brother    Heart disease Other    Arthritis Other    Cancer Other    Asthma Other    Diabetes Other    Kidney disease Other    Social History   Tobacco Use   Smoking status: Never   Smokeless tobacco: Never  Substance Use Topics   Alcohol use: Not Currently    Comment: 08/02/2018 "not since my 20's"   Marital Status: Married  ROS  Review of Systems  Cardiovascular:  Negative for chest pain, dyspnea on exertion and leg swelling.   Objective  Blood pressure 136/82, pulse 84, temperature 98.5 F (36.9 C), temperature source Temporal, resp. rate 16, height _0  (1.626 m), weight 209 lb (94.8 kg).     07/21/2022    9:54 AM 06/30/2022   10:31 AM 06/15/2022    1:02 PM  Vitals with BMI  Height _1  5' 4.5"   Weight 209 lbs 212 lbs 6 oz   BMI 36.62 94.7   Systolic 654 650 354  Diastolic 82 80 79  Pulse 84 93 97   Orthostatic VS for the past 72 hrs (Last 3 readings):  Patient Position BP Location Cuff Size  07/21/22 0954 Sitting Left Arm Large     Physical Exam Vitals reviewed.  Constitutional:      Appearance: She is well-developed. She is obese.  Neck:     Vascular: No carotid bruit or JVD.  Cardiovascular:     Rate and Rhythm: Normal rate and regular rhythm.     Pulses: Normal pulses and intact distal pulses.     Heart sounds: Normal heart  sounds. No murmur heard.    No gallop.  Pulmonary:     Effort: Pulmonary effort is normal. No respiratory distress.  Abdominal:     General: Bowel sounds are normal.     Palpations: Abdomen is soft.  Musculoskeletal:     Right lower leg: No edema.     Left lower leg: No edema.    Laboratory examination:      Latest Ref Rng & Units 05/13/2022    8:56 AM 04/02/2022    4:44 AM 07/27/2021  1:02 PM  CMP  Glucose 70 - 99 mg/dL 196  135  85   BUN 8 - 23 mg/dL _0 Creatinine 0.44 - 1.00 mg/dL 0.90  1.06  0.99   Sodium 135 - 145 mmol/L 136  136  136   Potassium 3.5 - 5.1 mmol/L 4.5  3.9  4.2   Chloride 98 - 111 mmol/L 104  102  101   CO2 22 - 32 mmol/L _1 Calcium 8.9 - 10.3 mg/dL 9.2  9.0  9.0   Total Protein 6.5 - 8.1 g/dL   7.4   Total Bilirubin 0.3 - 1.2 mg/dL   0.6   Alkaline Phos 38 - 126 U/L   87   AST 15 - 41 U/L   20   ALT 0 - 44 U/L   16       Latest Ref Rng & Units 05/13/2022   10:33 AM 04/02/2022    4:44 AM 07/27/2021    1:02 PM  CBC  WBC 4.0 - 10.5 K/uL 17.4  13.9  6.0   Hemoglobin 12.0 - 15.0 g/dL 12.1  14.0  13.7   Hematocrit 36.0 - 46.0 % 36.0  42.1  42.6   Platelets 150 - 400 K/uL 292  274  248    HEMOGLOBIN A1C Lab Results  Component Value Date   HGBA1C 5.4 04/03/2022   MPG 108.28 04/03/2022   External labs:   Labs 07/17/2022:  Serum glucose 97 mg, BUN 18, creatinine 0.93, EGFR 68 mL, LFTs normal.  Total cholesterol 124, triglycerides 146, HDL 62, LDL 38.  A1c 5.7%.    TSH 2.100 12/18/2020  Allergies   Allergies  Allergen Reactions   Adhesive [Tape] Other (See Comments)    SKIN WILL TEAR EASILY!!!!   Brilinta [Ticagrelor] Shortness Of Breath   Statins Other (See Comments)    myalgia   Carvedilol     Unknown reaction    Diovan [Valsartan]     Tingling    Hydrochlorothiazide     Unknown reaction    Ibuprofen     Causes bp to go up   Keflex [Cephalexin]     confusion   Morphine And Related     Hyper, ineffective     Norvasc [Amlodipine Besylate]     Numbness and tingling    Nsaids     Esophagus became red and swollen (tolerates meloxicam)    Prednisone     Elevated BP   Reglan [Metoclopramide]     Confusion, altered mental status   Singulair [Montelukast] Diarrhea   Temazepam Hives    Medications     Current Outpatient Medications:    acetaminophen (TYLENOL) 500 MG tablet, Take 1,000 mg by mouth every 8 (eight) hours as needed for moderate pain or headache., Disp: , Rfl:    aspirin EC 81 MG tablet, Take 81 mg by mouth daily., Disp: , Rfl:    cyanocobalamin (,VITAMIN B-12,) 1000 MCG/ML injection, Inject 1,000 mcg into the skin every 30 (thirty) days., Disp: , Rfl:    cyclobenzaprine (FLEXERIL) 10 MG tablet, Take 10 mg by mouth 3 (three) times daily as needed for muscle spasms. TAKES 1 TAB AT BEDTIME, Disp: , Rfl:    divalproex (DEPAKOTE ER) 250 MG 24 hr tablet, Take 1 tablet (250 mg total) by mouth daily., Disp: 90 tablet, Rfl: 3   Evolocumab (REPATHA SURECLICK) 503 MG/ML SOAJ, PA approved 10/26/2021 through 11/05/2022. Take as prescribed  every 2 weeks., Disp: , Rfl:    hydrOXYzine (ATARAX/VISTARIL) 25 MG tablet, Take 25 mg by mouth 4 (four) times daily as needed for itching., Disp: , Rfl:    loratadine (CLARITIN) 10 MG tablet, Take 10 mg by mouth at bedtime., Disp: , Rfl:    losartan (COZAAR) 25 MG tablet, Take 1 tablet (25 mg total) by mouth every evening., Disp: 30 tablet, Rfl: 2   nitroGLYCERIN (NITROSTAT) 0.4 MG SL tablet, DISSOLVE 1 TABLET SUBLINGUALLY AS NEEDED FOR CHEST PAIN, MAY REPEAT EVERY5 MINUTES. AFTER 3 CALL 911. (Patient taking differently: Place 0.4 mg under the tongue every 5 (five) minutes as needed for chest pain.), Disp: 25 tablet, Rfl: 0   ondansetron (ZOFRAN) 4 MG tablet, Take 1 tablet (4 mg total) by mouth every 6 (six) hours as needed for nausea or vomiting., Disp: 30 tablet, Rfl: 0   oxyCODONE (ROXICODONE) 5 MG immediate release tablet, Take 1-2 tablets (5-10 mg total) by mouth  every 4 (four) hours as needed for moderate pain or severe pain., Disp: 40 tablet, Rfl: 0   traZODone (DESYREL) 50 MG tablet, Take 100 mg by mouth at bedtime., Disp: , Rfl:    Vitamin D, Ergocalciferol, (DRISDOL) 1.25 MG (50000 UT) CAPS capsule, Take 50,000 Units by mouth every Monday., Disp: , Rfl:    acebutolol (SECTRAL) 200 MG capsule, Take 1 capsule (200 mg total) by mouth 2 (two) times daily., Disp: 180 capsule, Rfl: 3   pantoprazole (PROTONIX) 20 MG tablet, Take 1 tablet (20 mg total) by mouth daily., Disp: 90 tablet, Rfl: 3  Radiology:   Chest X-Ray 01/09/2017: IMPRESSION: No active cardiopulmonary disease.  MR Angio Head WO Contrast 06/13/2019: IMPRESSION: 1. No evidence of ischemia on repeat diffusion imaging. 2. No abnormal enhancement following contrast. 3. Intracranial MRA is within normal limits.  EEG 07/05/19: Normal electroencephalogram, awake, asleep and with activation procedures. There are no focal lateralizing or epileptiform features.   Cardiac Studies:   7 day event monitor 07/13/2018-07/19/2018: Dominant rhythm: Normal sinus rhythm.  11 patient activated events occurred with symptoms of chest pain, shortness of breath, rapid heart rate, correlating with sinus tachycardia.  Fastest tachycardia episode was day one at 1400 with 133 beats per without reported symptoms.  When autodetect event of sinus rhythm with 6 beats of NSVT on day 2 10:51 AM with symptoms of chest pain, shortness of breath.  No A. fib or SVT was noted.  Coronary angiogram 10/07/2018: No change from 08/02/2018 except D1 stenosis 50-60%, DFR negative. Normal right heart catheterization. The left ventricular systolic function is normal. LV end diastolic pressure is normal. Superdominant large RCA. Prox RCA to Mid RCA lesion is 80% stenosed S/P STENT ORSIRO DES 4.0X30 with 0% residual stenosis. Ost RPDA lesion is 50% stenosed. Small LAD ends before reaching apex.  PCV ECHOCARDIOGRAM COMPLETE  11/01/2020 Left ventricle cavity is normal in size and wall thickness. Normal global wall motion. Normal LV systolic function with EF 55%. Doppler evidence of grade I (impaired) diastolic dysfunction, normal LAP. Calculated EF 55%. Left atrial cavity is normal in size. Aneurysmal interatrial septum without 2D or color Doppler evidence of shunting. Trileaflet aortic valve.  Trace aortic regurgitation. Mild (Grade I) mitral regurgitation. Normal right atrial pressure. 4X6 cm echolucent area seen in liver, likely s/o liver cyst. Consider dedicated liver ultrasound. Compared to previous study in 2014, liver finding is new. Correlates with MRI of the abdomen done 01/11/2019 revealing multiple liver cysts.  Lexiscan Nuclear stress test 03/31/2021: Nondiagnostic ECG  stress due to pharmacologic stress. Myocardial perfusion is abnormal. There is a fixed moderate defect in the lateral and inferior regions ([place]). There is a reversible moderate defect in the septal and apical regions (Ischemia). Overall LV systolic function is abnormal with Mild inferoseptal and inferolateral severe hypokinesis. Stress LV EF: 41%. High risk study.  Prior study done on 07/22/2018 revealed a small inferolateral and apical defect consistent with ischemia and EF was 45%.  Left heart catheterization 04/08/2021: LV: 153/9, EDP 14 mmHg.  Ao 151/78, mean 109 mmHg.  No pressure gradient across the aortic valve. LM: Large vessel, smooth and normal. LAD: Large vessel in the proximal and, gives origin to a very large D1 and a large D2 and LAD itself is very small. CX: Moderate caliber vessel, smooth and normal. RI: Very tiny vessel with diffuse disease in the proximal segment. RCA: Dominant vessel.  Gives origin to large PL branch and large PDA.  Proximal to previously placed 3.0 x 30 mm Orsiro stent on 08/02/2020 is patent with mild neointimal hyperplasia.  Proximal to the stent there is a 80% stenosis.  Successful stenting with 4.0 x  16 mm Synergy XD DES = 0% stenosis. Ostial PDA 80% calcific stenosis, successful stenting with 2.5 x 12 mm Synergy XD DES = 0% stenosis.     Mid RCA 3.0 x 30 mm Orsiro stent on 08/02/2020; Prox RCA 4.0 x 16 mm Synergy XD 04/08/2021  EKG   EKG 07/21/2022: Normal sinus rhythm at a rate of 77 bpm, left axis axis, left intrafascicular block.  Incomplete right bundle branch block.  Poor R wave progression, cannot exclude anteroseptal infarct old. T wave inversion V1 to V3, normal variant.  No evidence of ischemia.  Compared to 12/18/2021, no change.  Assessment     ICD-10-CM   1. Coronary artery disease of native artery of native heart with stable angina pectoris ([place])  I25.118 EKG 12-Lead    2. Essential hypertension  I10 losartan (COZAAR) 25 MG tablet    3. Pure hypercholesterolemia  E78.00       Meds ordered this encounter  Medications   losartan (COZAAR) 25 MG tablet    Sig: Take 1 tablet (25 mg total) by mouth every evening.    Dispense:  30 tablet    Refill:  2   acebutolol (SECTRAL) 200 MG capsule    Sig: Take 1 capsule (200 mg total) by mouth 2 (two) times daily.    Dispense:  180 capsule    Refill:  3   pantoprazole (PROTONIX) 20 MG tablet    Sig: Take 1 tablet (20 mg total) by mouth daily.    Dispense:  90 tablet    Refill:  3      Medications Discontinued During This Encounter  Medication Reason   apixaban (ELIQUIS) 2.5 MG TABS tablet    ezetimibe (ZETIA) 10 MG tablet Discontinued by provider   pantoprazole (PROTONIX) 20 MG tablet Reorder   acebutolol (SECTRAL) 200 MG capsule Reorder    Recommendations:   Tonya Boyd  is a 66 y.o. female with raynaud's disease and GERD, CAD SP angioplasty to RCA on 08/02/2018 and again in 2022 for stenosis in the proximal segment of the previous stent, hyperlipidemia, has not been able to tolerate statins now on Repatha, hypertension presents here for 32-monthoffice visit.  1. Coronary artery disease of native artery of  native heart with stable angina pectoris ([facility] Patient without recurrence of angina pectoris, she underwent right knee replacement  without any perioperative complications.  Continue present medications.  2. Essential hypertension Blood pressure is well controlled, although is mildly elevated upon presentation.  She is not on an ACE inhibitor or an ARB due to dizziness in the past, she also had mild orthostasis in the past.  Now she has recovered well and hence I would like to try her back on losartan 25 mg in the evening.  Otherwise continue acebutolol that she is tolerating without any side effects.  3. Pure hypercholesterolemia Presently on Repatha, lipids under excellent control.  I reviewed her external labs, renal function is also normal.  In view of normalization of LDL, will discontinue Zetia.  Otherwise she remains stable from cardiac standpoint, I would like to see her back on annual basis.    Adrian Prows, MD, [facility] 07/21/2022, 10:57 AM Office: [phone] Fax: [phone] Pager: [phone]

## 2022-07-22 ENCOUNTER — Telehealth: Payer: Self-pay | Admitting: Cardiology

## 2022-07-22 DIAGNOSIS — R269 Unspecified abnormalities of gait and mobility: Secondary | ICD-10-CM | POA: Diagnosis not present

## 2022-07-22 DIAGNOSIS — M62561 Muscle wasting and atrophy, not elsewhere classified, right lower leg: Secondary | ICD-10-CM | POA: Diagnosis not present

## 2022-07-22 DIAGNOSIS — M25561 Pain in right knee: Secondary | ICD-10-CM | POA: Diagnosis not present

## 2022-07-22 DIAGNOSIS — M25661 Stiffness of right knee, not elsewhere classified: Secondary | ICD-10-CM | POA: Diagnosis not present

## 2022-07-22 NOTE — Telephone Encounter (Signed)
Patient called and asks for you to send in RX for Plavix 75mg  to [facility] mail order.  If she's NOT to be taking it, please let patient know. She may be reached at [phone]

## 2022-07-24 DIAGNOSIS — N1831 Chronic kidney disease, stage 3a: Secondary | ICD-10-CM | POA: Diagnosis not present

## 2022-07-24 DIAGNOSIS — R2990 Unspecified symptoms and signs involving the nervous system: Secondary | ICD-10-CM | POA: Diagnosis not present

## 2022-07-24 DIAGNOSIS — I25118 Atherosclerotic heart disease of native coronary artery with other forms of angina pectoris: Secondary | ICD-10-CM | POA: Diagnosis not present

## 2022-07-24 DIAGNOSIS — R202 Paresthesia of skin: Secondary | ICD-10-CM | POA: Diagnosis not present

## 2022-07-24 DIAGNOSIS — E538 Deficiency of other specified B group vitamins: Secondary | ICD-10-CM | POA: Diagnosis not present

## 2022-07-24 DIAGNOSIS — I700 Atherosclerosis of aorta: Secondary | ICD-10-CM | POA: Diagnosis not present

## 2022-07-24 DIAGNOSIS — E782 Mixed hyperlipidemia: Secondary | ICD-10-CM | POA: Diagnosis not present

## 2022-07-24 DIAGNOSIS — J432 Centrilobular emphysema: Secondary | ICD-10-CM | POA: Diagnosis not present

## 2022-07-24 DIAGNOSIS — I10 Essential (primary) hypertension: Secondary | ICD-10-CM | POA: Diagnosis not present

## 2022-07-24 DIAGNOSIS — G8193 Hemiplegia, unspecified affecting right nondominant side: Secondary | ICD-10-CM | POA: Diagnosis not present

## 2022-07-24 DIAGNOSIS — R471 Dysarthria and anarthria: Secondary | ICD-10-CM | POA: Diagnosis not present

## 2022-07-27 DIAGNOSIS — R269 Unspecified abnormalities of gait and mobility: Secondary | ICD-10-CM | POA: Diagnosis not present

## 2022-07-27 DIAGNOSIS — M62561 Muscle wasting and atrophy, not elsewhere classified, right lower leg: Secondary | ICD-10-CM | POA: Diagnosis not present

## 2022-07-27 DIAGNOSIS — M25661 Stiffness of right knee, not elsewhere classified: Secondary | ICD-10-CM | POA: Diagnosis not present

## 2022-07-27 DIAGNOSIS — M25561 Pain in right knee: Secondary | ICD-10-CM | POA: Diagnosis not present

## 2022-08-05 DIAGNOSIS — R269 Unspecified abnormalities of gait and mobility: Secondary | ICD-10-CM | POA: Diagnosis not present

## 2022-08-05 DIAGNOSIS — M62561 Muscle wasting and atrophy, not elsewhere classified, right lower leg: Secondary | ICD-10-CM | POA: Diagnosis not present

## 2022-08-05 DIAGNOSIS — M25561 Pain in right knee: Secondary | ICD-10-CM | POA: Diagnosis not present

## 2022-08-05 DIAGNOSIS — M25661 Stiffness of right knee, not elsewhere classified: Secondary | ICD-10-CM | POA: Diagnosis not present

## 2022-08-07 DIAGNOSIS — M25661 Stiffness of right knee, not elsewhere classified: Secondary | ICD-10-CM | POA: Diagnosis not present

## 2022-08-07 DIAGNOSIS — M25561 Pain in right knee: Secondary | ICD-10-CM | POA: Diagnosis not present

## 2022-08-07 DIAGNOSIS — R269 Unspecified abnormalities of gait and mobility: Secondary | ICD-10-CM | POA: Diagnosis not present

## 2022-08-07 DIAGNOSIS — M62561 Muscle wasting and atrophy, not elsewhere classified, right lower leg: Secondary | ICD-10-CM | POA: Diagnosis not present

## 2022-08-10 DIAGNOSIS — J440 Chronic obstructive pulmonary disease with acute lower respiratory infection: Secondary | ICD-10-CM | POA: Diagnosis not present

## 2022-08-10 DIAGNOSIS — J432 Centrilobular emphysema: Secondary | ICD-10-CM | POA: Diagnosis not present

## 2022-08-10 DIAGNOSIS — R059 Cough, unspecified: Secondary | ICD-10-CM | POA: Diagnosis not present

## 2022-08-14 ENCOUNTER — Ambulatory Visit: Payer: Medicare Other

## 2022-08-14 ENCOUNTER — Other Ambulatory Visit: Payer: Self-pay | Admitting: Surgery

## 2022-08-14 DIAGNOSIS — M1711 Unilateral primary osteoarthritis, right knee: Secondary | ICD-10-CM | POA: Diagnosis not present

## 2022-08-14 DIAGNOSIS — Z96651 Presence of right artificial knee joint: Secondary | ICD-10-CM | POA: Diagnosis not present

## 2022-08-17 ENCOUNTER — Ambulatory Visit: Payer: Medicare Other

## 2022-08-17 ENCOUNTER — Ambulatory Visit (HOSPITAL_COMMUNITY)
Admission: RE | Admit: 2022-08-17 | Discharge: 2022-08-17 | Disposition: A | Discharge status: Home / Self Care | Payer: Medicare Other | Source: Ambulatory Visit | Attending: Surgery | Admitting: Surgery

## 2022-08-17 DIAGNOSIS — M25561 Pain in right knee: Secondary | ICD-10-CM | POA: Diagnosis not present

## 2022-08-17 DIAGNOSIS — M25661 Stiffness of right knee, not elsewhere classified: Secondary | ICD-10-CM | POA: Diagnosis not present

## 2022-08-17 DIAGNOSIS — R269 Unspecified abnormalities of gait and mobility: Secondary | ICD-10-CM | POA: Diagnosis not present

## 2022-08-17 DIAGNOSIS — Z96651 Presence of right artificial knee joint: Secondary | ICD-10-CM | POA: Insufficient documentation

## 2022-08-17 DIAGNOSIS — M62561 Muscle wasting and atrophy, not elsewhere classified, right lower leg: Secondary | ICD-10-CM | POA: Diagnosis not present

## 2022-08-17 DIAGNOSIS — M79661 Pain in right lower leg: Secondary | ICD-10-CM | POA: Diagnosis not present

## 2022-08-18 ENCOUNTER — Other Ambulatory Visit: Payer: Self-pay

## 2022-08-18 ENCOUNTER — Telehealth: Payer: Self-pay

## 2022-08-18 MED ORDER — ISOSORBIDE MONONITRATE ER 30 MG PO TB24: 30.0000 mg | ORAL_TABLET | Freq: Every day | ORAL | 3 refills | Status: DC

## 2022-08-18 NOTE — Telephone Encounter (Signed)
Patient said she has been having chest pain on and off for a week. It comes and goes

## 2022-08-18 NOTE — Telephone Encounter (Signed)
Has she used NTG. If it is relieved, then fine. Send Rx for Imdur 30 mg daily 30 tab with 6 refills

## 2022-08-18 NOTE — Telephone Encounter (Signed)
I sent in her meds she wants to see you, I added her for Thursday afternoon

## 2022-08-20 ENCOUNTER — Ambulatory Visit: Payer: Medicare Other | Admitting: Cardiology

## 2022-08-20 ENCOUNTER — Encounter: Payer: Self-pay | Admitting: Cardiology

## 2022-08-20 VITALS — BP 151/79 | HR 90 | Resp 16 | Ht 64.0 in | Wt 207.2 lb

## 2022-08-20 DIAGNOSIS — I10 Essential (primary) hypertension: Secondary | ICD-10-CM | POA: Diagnosis not present

## 2022-08-20 DIAGNOSIS — I25118 Atherosclerotic heart disease of native coronary artery with other forms of angina pectoris: Secondary | ICD-10-CM | POA: Diagnosis not present

## 2022-08-20 DIAGNOSIS — E7800 Pure hypercholesterolemia, unspecified: Secondary | ICD-10-CM

## 2022-08-20 NOTE — Progress Notes (Signed)
Primary Physician/Referring:  Merrilee Seashore, MD  Patient ID: Tonya Boyd, female    DOB: [birth year], 66 y.o.   MRN: [id number]  Chief Complaint  Patient presents with   Chest Pain        Coronary Artery Disease   Hypertension    HPI:    Tonya Boyd  is a 66 y.o. female with raynaud's disease and GERD, CAD SP angioplasty to RCA on 08/02/2018 and again in 2022 for stenosis in the proximal segment of the previous stent, hyperlipidemia, has not been able to tolerate statins now on Repatha, hypertension is now being seen on an urgent basis due to recurrence of chest pain similar to her prior angina pectoris.  Past Medical History:  Diagnosis Date   Angina pectoris ([place])    Anxiety    Asthma    CAD (coronary artery disease), native coronary artery 08/02/2018   a.) [facility] 08/02/2018: EF 45%, super-dominant large RCA, 80% p-mRCA, 50% oRPDA, 80% D1; mPA 13, mPCWP 8, PA sat 81%, Ao sat 99, CO 6.95, CI 3.65 --> 4.0 x 30 mm Orsiro DES x1 to [facility]; b.) [facility] 10/07/2018: 50-60% oD1 (FFR 0.96), diffuse HG stenosis prox seg very small (<1 mm) RI --> med mgmt; c.) [facility] 04/08/2021: 80% pRCA (4.0 x 16 mm Synergy XD DES) and 80% oPDA (2.5 x 12 mm Synergy XD DES)   Chronic bronchitis (HCC)    Chronic kidney disease    Complication of anesthesia    a.) postoperative nausea only (no vomiting); also "shakes"; per patient relieved w/ IV diphenhydramine   COPD (chronic obstructive pulmonary disease) ([place])    Depression    Diastolic dysfunction 76/72/0947   a.) TTE 07/23/2018: EF 55-60%, GLS -19.6%, G1DD; b.) TTE 11/01/2020: EF 55%, triv AR, G1DD   Family history of adverse reaction to anesthesia    "cousin stopped breathing; he was allergic to the anesthesia" (08/02/2018)   GERD (gastroesophageal reflux disease)    High cholesterol    History of gout    History of hiatal hernia    History of kidney stones    Hypertension 07/2018   Interstitial cystitis    Long term current use of  antithrombotics/antiplatelets    a.) daily DAPT therapy (ASA + clopidogrel)   Migraines    NSVT (nonsustained ventricular tachycardia) ([place]) 07/2018   a.) holter 07/13/2018: 6 beat run NSVT; maximum rate 133 bpm.   OA (osteoarthritis)    Raynaud's disease    Stroke-like episode 06/08/2019   a.) speech difficulty, right sided weakness/paraesthesias; unclear etiology ?? possibly conversion disorder. MRI brain x 2 negative for stroke.   Past Surgical History:  Procedure Laterality Date   ABDOMINAL HYSTERECTOMY     ANTERIOR CERVICAL DECOMP/DISCECTOMY FUSION     APPENDECTOMY     AUGMENTATION MAMMAPLASTY Bilateral    BACK SURGERY     CORONARY ANGIOPLASTY WITH STENT PLACEMENT  08/02/2018   CORONARY STENT INTERVENTION N/A 08/02/2018   Procedure: CORONARY STENT INTERVENTION;  Surgeon: Adrian Prows, MD;  Location: [place] [facility];  Service: Cardiovascular;  Laterality: N/A;  RCA    CORONARY STENT INTERVENTION N/A 04/08/2021   Procedure: CORONARY STENT INTERVENTION;  Surgeon: Adrian Prows, MD;  Location: [place] [facility];  Service: Cardiovascular;  Laterality: N/A;  RCA and PDA   CYSTOSCOPY W/ STONE MANIPULATION     INTRAVASCULAR PRESSURE WIRE/FFR STUDY N/A 10/07/2018   Procedure: INTRAVASCULAR PRESSURE WIRE/FFR STUDY;  Surgeon: Adrian Prows, MD;  Location: [place] [facility];  Service: Cardiovascular;  Laterality: N/A;   KNEE ARTHROSCOPY Right    LAPAROSCOPIC CHOLECYSTECTOMY     LEFT HEART CATH AND CORONARY ANGIOGRAPHY N/A 10/07/2018   Procedure: LEFT HEART CATH AND CORONARY ANGIOGRAPHY;  Surgeon: Adrian Prows, MD;  Location: [place] [facility];  Service: Cardiovascular;  Laterality: N/A;   LEFT HEART CATH AND CORONARY ANGIOGRAPHY N/A 04/08/2021   Procedure: LEFT HEART CATH AND CORONARY ANGIOGRAPHY;  Surgeon: Adrian Prows, MD;  Location: [place] [facility];  Service: Cardiovascular;  Laterality: N/A;   REPAIR ANKLE LIGAMENT Left    "tied up ligament; had tore up qthing in my ankle"   REPLACEMENT  TOTAL KNEE Right    RIGHT/LEFT HEART CATH AND CORONARY ANGIOGRAPHY N/A 08/02/2018   Procedure: RIGHT/LEFT HEART CATH AND CORONARY ANGIOGRAPHY;  Surgeon: Adrian Prows, MD;  Location: [place] [facility];  Service: Cardiovascular;  Laterality: N/A;   SHOULDER SURGERY Bilateral    TONSILLECTOMY     TOTAL KNEE ARTHROPLASTY Right 05/12/2022   Procedure: TOTAL KNEE ARTHROPLASTY;  Surgeon: Corky Mull, MD;  Location: [facility];  Service: Orthopedics;  Laterality: Right;   TUBAL LIGATION     Family History  Problem Relation Age of Onset   Kidney failure Mother    Diabetes Mother    Heart disease Father    Heart attack Father    Colon cancer Brother    Liver cancer Brother    Heart disease Other    Arthritis Other    Cancer Other    Asthma Other    Diabetes Other    Kidney disease Other    Social History   Tobacco Use   Smoking status: Never   Smokeless tobacco: Never  Substance Use Topics   Alcohol use: Not Currently    Comment: 08/02/2018 "not since my 20's"   Marital Status: Married  ROS  Review of Systems  Cardiovascular:  Positive for chest pain. Negative for dyspnea on exertion and leg swelling.   Objective  Blood pressure (!) 151/79, pulse 90, resp. rate 16, height _0  (1.626 m), weight 207 lb 3.2 oz (94 kg), SpO2 94 %.     08/20/2022    3:11 PM 07/21/2022    9:54 AM 06/30/2022   10:31 AM  Vitals with BMI  Height _1  _2  5' 4.5"  Weight 207 lbs 3 oz 209 lbs 212 lbs 6 oz  BMI 35.55 65.99 35.7  Systolic 017 793 903  Diastolic 79 82 80  Pulse 90 84 93   Orthostatic VS for the past 72 hrs (Last 3 readings):  Patient Position BP Location Cuff Size  08/20/22 1511 Sitting Left Arm Large     Physical Exam Vitals reviewed.  Constitutional:      Appearance: She is well-developed. She is obese.  Neck:     Vascular: No carotid bruit or JVD.  Cardiovascular:     Rate and Rhythm: Normal rate and regular rhythm.     Pulses: Normal pulses and intact distal pulses.      Heart sounds: Normal heart sounds. No murmur heard.    No gallop.  Pulmonary:     Effort: Pulmonary effort is normal. No respiratory distress.  Abdominal:     General: Bowel sounds are normal.     Palpations: Abdomen is soft.  Musculoskeletal:     Right lower leg: No edema.     Left lower leg: No edema.    Laboratory examination:      Latest Ref Rng &  Units 05/13/2022    8:56 AM 04/02/2022    4:44 AM 07/27/2021    1:02 PM  CMP  Glucose 70 - 99 mg/dL 196  135  85   BUN 8 - 23 mg/dL _0 Creatinine 0.44 - 1.00 mg/dL 0.90  1.06  0.99   Sodium 135 - 145 mmol/L 136  136  136   Potassium 3.5 - 5.1 mmol/L 4.5  3.9  4.2   Chloride 98 - 111 mmol/L 104  102  101   CO2 22 - 32 mmol/L _1 Calcium 8.9 - 10.3 mg/dL 9.2  9.0  9.0   Total Protein 6.5 - 8.1 g/dL   7.4   Total Bilirubin 0.3 - 1.2 mg/dL   0.6   Alkaline Phos 38 - 126 U/L   87   AST 15 - 41 U/L   20   ALT 0 - 44 U/L   16       Latest Ref Rng & Units 05/13/2022   10:33 AM 04/02/2022    4:44 AM 07/27/2021    1:02 PM  CBC  WBC 4.0 - 10.5 K/uL 17.4  13.9  6.0   Hemoglobin 12.0 - 15.0 g/dL 12.1  14.0  13.7   Hematocrit 36.0 - 46.0 % 36.0  42.1  42.6   Platelets 150 - 400 K/uL 292  274  248    HEMOGLOBIN A1C Lab Results  Component Value Date   HGBA1C 5.4 04/03/2022   MPG 108.28 04/03/2022   External labs:   Labs 07/17/2022:  Serum glucose 97 mg, BUN 18, creatinine 0.93, EGFR 68 mL, LFTs normal.  Total cholesterol 124, triglycerides 146, HDL 62, LDL 38.  A1c 5.7%.    TSH 2.100 12/18/2020  Allergies   Allergies  Allergen Reactions   Adhesive [Tape] Other (See Comments)    SKIN WILL TEAR EASILY!!!!   Brilinta [Ticagrelor] Shortness Of Breath   Statins Other (See Comments)    myalgia   Carvedilol     Unknown reaction    Diovan [Valsartan]     Tingling    Hydrochlorothiazide     Unknown reaction    Ibuprofen     Causes bp to go up   Keflex [Cephalexin]     confusion   Morphine And  Related     Hyper, ineffective    Norvasc [Amlodipine Besylate]     Numbness and tingling    Nsaids     Esophagus became red and swollen (tolerates meloxicam)    Prednisone     Elevated BP   Reglan [Metoclopramide]     Confusion, altered mental status   Singulair [Montelukast] Diarrhea   Temazepam Hives    Medications     Current Outpatient Medications:    acebutolol (SECTRAL) 200 MG capsule, Take 1 capsule (200 mg total) by mouth 2 (two) times daily., Disp: 180 capsule, Rfl: 3   aspirin EC 81 MG tablet, Take 81 mg by mouth daily., Disp: , Rfl:    cyanocobalamin (,VITAMIN B-12,) 1000 MCG/ML injection, Inject 1,000 mcg into the skin every 30 (thirty) days., Disp: , Rfl:    cyclobenzaprine (FLEXERIL) 10 MG tablet, Take 10 mg by mouth 3 (three) times daily as needed for muscle spasms. TAKES 1 TAB AT BEDTIME, Disp: , Rfl:    divalproex (DEPAKOTE ER) 250 MG 24 hr tablet, Take 1 tablet (250 mg total) by mouth daily., Disp: 90 tablet, Rfl: 3  Evolocumab (REPATHA SURECLICK) 096 MG/ML SOAJ, PA approved 10/26/2021 through 11/05/2022. Take as prescribed every 2 weeks., Disp: , Rfl:    hydrOXYzine (ATARAX/VISTARIL) 25 MG tablet, Take 25 mg by mouth 4 (four) times daily as needed for itching., Disp: , Rfl:    loratadine (CLARITIN) 10 MG tablet, Take 10 mg by mouth at bedtime., Disp: , Rfl:    losartan (COZAAR) 25 MG tablet, Take 1 tablet (25 mg total) by mouth every evening., Disp: 30 tablet, Rfl: 2   nitroGLYCERIN (NITROSTAT) 0.4 MG SL tablet, DISSOLVE 1 TABLET SUBLINGUALLY AS NEEDED FOR CHEST PAIN, MAY REPEAT EVERY5 MINUTES. AFTER 3 CALL 911. (Patient taking differently: Place 0.4 mg under the tongue every 5 (five) minutes as needed for chest pain.), Disp: 25 tablet, Rfl: 0   ondansetron (ZOFRAN) 4 MG tablet, Take 1 tablet (4 mg total) by mouth every 6 (six) hours as needed for nausea or vomiting., Disp: 30 tablet, Rfl: 0   oxyCODONE (ROXICODONE) 5 MG immediate release tablet, Take 1-2 tablets  (5-10 mg total) by mouth every 4 (four) hours as needed for moderate pain or severe pain., Disp: 40 tablet, Rfl: 0   pantoprazole (PROTONIX) 20 MG tablet, Take 1 tablet (20 mg total) by mouth daily., Disp: 90 tablet, Rfl: 3   SYMBICORT 80-4.5 MCG/ACT inhaler, Inhale 2 puffs into the lungs., Disp: , Rfl:    traZODone (DESYREL) 50 MG tablet, Take 100 mg by mouth at bedtime., Disp: , Rfl:    Vitamin D, Ergocalciferol, (DRISDOL) 1.25 MG (50000 UT) CAPS capsule, Take 50,000 Units by mouth every Monday., Disp: , Rfl:    acetaminophen (TYLENOL) 500 MG tablet, Take 1,000 mg by mouth every 8 (eight) hours as needed for moderate pain or headache., Disp: , Rfl:    isosorbide mononitrate (IMDUR) 30 MG 24 hr tablet, Take 1 tablet (30 mg total) by mouth daily. (Patient not taking: Reported on 08/20/2022), Disp: 30 tablet, Rfl: 3  Radiology:   Chest X-Ray 01/09/2017: IMPRESSION: No active cardiopulmonary disease.  MR Angio Head WO Contrast 06/13/2019: IMPRESSION: 1. No evidence of ischemia on repeat diffusion imaging. 2. No abnormal enhancement following contrast. 3. Intracranial MRA is within normal limits.  EKG 07/05/19:  EKG 08/20/2022: Normal sinus rhythm at a rate of 84 bpm, leftward axis, incomplete right bundle branch block.  Poor R progression, probably normal variant.  Low-voltage complexes, consider pulmonary disease pattern.  Compared to 07/21/2022, no significant change.   Cardiac Studies:   7 day event monitor 07/13/2018-07/19/2018: Dominant rhythm: Normal sinus rhythm.  11 patient activated events occurred with symptoms of chest pain, shortness of breath, rapid heart rate, correlating with sinus tachycardia.  Fastest tachycardia episode was day one at 1400 with 133 beats per without reported symptoms.  When autodetect event of sinus rhythm with 6 beats of NSVT on day 2 10:51 AM with symptoms of chest pain, shortness of breath.  No A. fib or SVT was noted.  Coronary angiogram 10/07/2018: No  change from 08/02/2018 except D1 stenosis 50-60%, DFR negative. Normal right heart catheterization. The left ventricular systolic function is normal. LV end diastolic pressure is normal. Superdominant large RCA. Prox RCA to Mid RCA lesion is 80% stenosed S/P STENT ORSIRO DES 4.0X30 with 0% residual stenosis. Ost RPDA lesion is 50% stenosed. Small LAD ends before reaching apex.  PCV ECHOCARDIOGRAM COMPLETE 11/01/2020 Left ventricle cavity is normal in size and wall thickness. Normal global wall motion. Normal LV systolic function with EF 55%. Doppler evidence of grade I (  impaired) diastolic dysfunction, normal LAP. Calculated EF 55%. Left atrial cavity is normal in size. Aneurysmal interatrial septum without 2D or color Doppler evidence of shunting. Trileaflet aortic valve.  Trace aortic regurgitation. Mild (Grade I) mitral regurgitation. Normal right atrial pressure. 4X6 cm echolucent area seen in liver, likely s/o liver cyst. Consider dedicated liver ultrasound. Compared to previous study in 2014, liver finding is new. Correlates with MRI of the abdomen done 01/11/2019 revealing multiple liver cysts.  Lexiscan Nuclear stress test 03/31/2021: Nondiagnostic ECG stress due to pharmacologic stress. Myocardial perfusion is abnormal. There is a fixed moderate defect in the lateral and inferior regions ([place]). There is a reversible moderate defect in the septal and apical regions (Ischemia). Overall LV systolic function is abnormal with Mild inferoseptal and inferolateral severe hypokinesis. Stress LV EF: 41%. High risk study.  Prior study done on 07/22/2018 revealed a small inferolateral and apical defect consistent with ischemia and EF was 45%.  Left heart catheterization 04/08/2021: LV: 153/9, EDP 14 mmHg.  Ao 151/78, mean 109 mmHg.  No pressure gradient across the aortic valve. LM: Large vessel, smooth and normal. LAD: Large vessel in the proximal and, gives origin to a very large D1 and a large D2  and LAD itself is very small. CX: Moderate caliber vessel, smooth and normal. RI: Very tiny vessel with diffuse disease in the proximal segment. RCA: Dominant vessel.  Gives origin to large PL branch and large PDA.  Proximal to previously placed 3.0 x 30 mm Orsiro stent on 08/02/2020 is patent with mild neointimal hyperplasia.  Proximal to the stent there is a 80% stenosis.  Successful stenting with 4.0 x 16 mm Synergy XD DES = 0% stenosis. Ostial PDA 80% calcific stenosis, successful stenting with 2.5 x 12 mm Synergy XD DES = 0% stenosis.     Mid RCA 3.0 x 30 mm Orsiro stent on 08/02/2020; Prox RCA 4.0 x 16 mm Synergy XD 04/08/2021  EKG   EKG 08/20/2022: Normal sinus rhythm at a rate of 84 bpm, leftward axis, incomplete right bundle branch block.  Poor R progression, probably normal variant.  Low-voltage complexes, consider pulmonary disease pattern.  Compared to 07/21/2022, no significant change.  Assessment     ICD-10-CM   1. Coronary artery disease of native artery of native heart with stable angina pectoris (HCC)  I25.118 EKG 12-Lead    CBC    Basic metabolic panel    2. Pure hypercholesterolemia  E78.00     3. Essential hypertension  I10       No orders of the defined types were placed in this encounter.   There are no discontinued medications.  Recommendations:   Tonya Boyd  is a 66 y.o. female with raynaud's disease and GERD, CAD SP angioplasty to RCA on 08/02/2018 and again in 2022 for stenosis in the proximal segment of the previous stent, hyperlipidemia, has not been able to tolerate statins now on Repatha, hypertension is now being seen on an urgent basis due to recurrence of chest pain similar to her prior angina pectoris.  1. Coronary artery disease of native artery of native heart with stable angina pectoris [facility]) Patient with recurrence of angina pectoris, EKG is normal, she has been using sublingual nitroglycerin almost 1 to 2 tablets on a daily basis for the  past 2 weeks especially in the last 1 week.  Although her EKG is normal, she is extremely high risk features and she has presented similarly in the past and with cardiac  catheterization was found to have no lesions.  Hence best option is to proceed with cardiac catheterization.  Schedule for cardiac catheterization, and possible angioplasty. We discussed regarding risks, benefits, alternatives to this including stress testing, CTA and continued medical therapy. Patient wants to proceed. Understands <1-2% risk of death, stroke, MI, urgent CABG, bleeding, infection, renal failure but not limited to these.   2. Essential hypertension Blood pressure is well controlled on acebutolol and losartan, continue the same.  3. Pure hypercholesterolemia Presently on Repatha, lipids under excellent control.  Would like to see her back in 4 to 6 weeks for follow-up following cardiac catheterization.  Her husband is present and all questions answered      Adrian Prows, MD, [facility] 08/20/2022, 11:04 PM Office: [phone] Fax: [phone] Pager: [phone]

## 2022-08-20 NOTE — H&P (View-Only) (Signed)
Primary Physician/Referring:  Merrilee Seashore, MD  Patient ID: Tonya Boyd, female    DOB: 11-Sep-1955, 66 y.o.   MRN: [id number]  Chief Complaint  Patient presents with   Chest Pain        Coronary Artery Disease   Hypertension    HPI:    Tonya Boyd  is a 66 y.o. female with raynaud's disease and GERD, CAD SP angioplasty to RCA on 08/02/2018 and again in 2022 for stenosis in the proximal segment of the previous stent, hyperlipidemia, has not been able to tolerate statins now on Repatha, hypertension is now being seen on an urgent basis due to recurrence of chest pain similar to her prior angina pectoris.  Past Medical History:  Diagnosis Date   Angina pectoris ([place])    Anxiety    Asthma    CAD (coronary artery disease), native coronary artery 08/02/2018   a.) [facility] 08/02/2018: EF 45%, super-dominant large RCA, 80% p-mRCA, 50% oRPDA, 80% D1; mPA 13, mPCWP 8, PA sat 81%, Ao sat 99, CO 6.95, CI 3.65 --> 4.0 x 30 mm Orsiro DES x1 to [facility]; b.) [facility] 10/07/2018: 50-60% oD1 (FFR 0.96), diffuse HG stenosis prox seg very small (<1 mm) RI --> med mgmt; c.) LHC 04/08/2021: 80% pRCA (4.0 x 16 mm Synergy XD DES) and 80% oPDA (2.5 x 12 mm Synergy XD DES)   Chronic bronchitis (HCC)    Chronic kidney disease    Complication of anesthesia    a.) postoperative nausea only (no vomiting); also "shakes"; per patient relieved w/ IV diphenhydramine   COPD (chronic obstructive pulmonary disease) ([place])    Depression    Diastolic dysfunction 22/29/7989   a.) TTE 07/23/2018: EF 55-60%, GLS -19.6%, G1DD; b.) TTE 11/01/2020: EF 55%, triv AR, G1DD   Family history of adverse reaction to anesthesia    "cousin stopped breathing; he was allergic to the anesthesia" (08/02/2018)   GERD (gastroesophageal reflux disease)    High cholesterol    History of gout    History of hiatal hernia    History of kidney stones    Hypertension 07/2018   Interstitial cystitis    Long term current use of  antithrombotics/antiplatelets    a.) daily DAPT therapy (ASA + clopidogrel)   Migraines    NSVT (nonsustained ventricular tachycardia) ([place]) 07/2018   a.) holter 07/13/2018: 6 beat run NSVT; maximum rate 133 bpm.   OA (osteoarthritis)    Raynaud's disease    Stroke-like episode 06/08/2019   a.) speech difficulty, right sided weakness/paraesthesias; unclear etiology ?? possibly conversion disorder. MRI brain x 2 negative for stroke.   Past Surgical History:  Procedure Laterality Date   ABDOMINAL HYSTERECTOMY     ANTERIOR CERVICAL DECOMP/DISCECTOMY FUSION     APPENDECTOMY     AUGMENTATION MAMMAPLASTY Bilateral    BACK SURGERY     CORONARY ANGIOPLASTY WITH STENT PLACEMENT  08/02/2018   CORONARY STENT INTERVENTION N/A 08/02/2018   Procedure: CORONARY STENT INTERVENTION;  Surgeon: Adrian Prows, MD;  Location: [place] [facility];  Service: Cardiovascular;  Laterality: N/A;  RCA    CORONARY STENT INTERVENTION N/A 04/08/2021   Procedure: CORONARY STENT INTERVENTION;  Surgeon: Adrian Prows, MD;  Location: [place] [facility];  Service: Cardiovascular;  Laterality: N/A;  RCA and PDA   CYSTOSCOPY W/ STONE MANIPULATION     INTRAVASCULAR PRESSURE WIRE/FFR STUDY N/A 10/07/2018   Procedure: INTRAVASCULAR PRESSURE WIRE/FFR STUDY;  Surgeon: Adrian Prows, MD;  Location: [place] [facility];  Service: Cardiovascular;  Laterality: N/A;   KNEE ARTHROSCOPY Right    LAPAROSCOPIC CHOLECYSTECTOMY     LEFT HEART CATH AND CORONARY ANGIOGRAPHY N/A 10/07/2018   Procedure: LEFT HEART CATH AND CORONARY ANGIOGRAPHY;  Surgeon: Adrian Prows, MD;  Location: [place] [facility];  Service: Cardiovascular;  Laterality: N/A;   LEFT HEART CATH AND CORONARY ANGIOGRAPHY N/A 04/08/2021   Procedure: LEFT HEART CATH AND CORONARY ANGIOGRAPHY;  Surgeon: Adrian Prows, MD;  Location: [place] [facility];  Service: Cardiovascular;  Laterality: N/A;   REPAIR ANKLE LIGAMENT Left    "tied up ligament; had tore up qthing in my ankle"   REPLACEMENT  TOTAL KNEE Right    RIGHT/LEFT HEART CATH AND CORONARY ANGIOGRAPHY N/A 08/02/2018   Procedure: RIGHT/LEFT HEART CATH AND CORONARY ANGIOGRAPHY;  Surgeon: Adrian Prows, MD;  Location: [place] [facility];  Service: Cardiovascular;  Laterality: N/A;   SHOULDER SURGERY Bilateral    TONSILLECTOMY     TOTAL KNEE ARTHROPLASTY Right 05/12/2022   Procedure: TOTAL KNEE ARTHROPLASTY;  Surgeon: Corky Mull, MD;  Location: [facility];  Service: Orthopedics;  Laterality: Right;   TUBAL LIGATION     Family History  Problem Relation Age of Onset   Kidney failure Mother    Diabetes Mother    Heart disease Father    Heart attack Father    Colon cancer Brother    Liver cancer Brother    Heart disease Other    Arthritis Other    Cancer Other    Asthma Other    Diabetes Other    Kidney disease Other    Social History   Tobacco Use   Smoking status: Never   Smokeless tobacco: Never  Substance Use Topics   Alcohol use: Not Currently    Comment: 08/02/2018 "not since my 20's"   Marital Status: Married  ROS  Review of Systems  Cardiovascular:  Positive for chest pain. Negative for dyspnea on exertion and leg swelling.   Objective  Blood pressure (!) 151/79, pulse 90, resp. rate 16, height _0  (1.626 m), weight 207 lb 3.2 oz (94 kg), SpO2 94 %.     08/20/2022    3:11 PM 07/21/2022    9:54 AM 06/30/2022   10:31 AM  Vitals with BMI  Height _1  _2  5' 4.5"  Weight 207 lbs 3 oz 209 lbs 212 lbs 6 oz  BMI 35.55 41.32 44.0  Systolic 102 725 366  Diastolic 79 82 80  Pulse 90 84 93   Orthostatic VS for the past 72 hrs (Last 3 readings):  Patient Position BP Location Cuff Size  08/20/22 1511 Sitting Left Arm Large     Physical Exam Vitals reviewed.  Constitutional:      Appearance: She is well-developed. She is obese.  Neck:     Vascular: No carotid bruit or JVD.  Cardiovascular:     Rate and Rhythm: Normal rate and regular rhythm.     Pulses: Normal pulses and intact distal pulses.      Heart sounds: Normal heart sounds. No murmur heard.    No gallop.  Pulmonary:     Effort: Pulmonary effort is normal. No respiratory distress.  Abdominal:     General: Bowel sounds are normal.     Palpations: Abdomen is soft.  Musculoskeletal:     Right lower leg: No edema.     Left lower leg: No edema.    Laboratory examination:      Latest Ref Rng &  Units 05/13/2022    8:56 AM 04/02/2022    4:44 AM 07/27/2021    1:02 PM  CMP  Glucose 70 - 99 mg/dL 196  135  85   BUN 8 - 23 mg/dL _0 Creatinine 0.44 - 1.00 mg/dL 0.90  1.06  0.99   Sodium 135 - 145 mmol/L 136  136  136   Potassium 3.5 - 5.1 mmol/L 4.5  3.9  4.2   Chloride 98 - 111 mmol/L 104  102  101   CO2 22 - 32 mmol/L _1 Calcium 8.9 - 10.3 mg/dL 9.2  9.0  9.0   Total Protein 6.5 - 8.1 g/dL   7.4   Total Bilirubin 0.3 - 1.2 mg/dL   0.6   Alkaline Phos 38 - 126 U/L   87   AST 15 - 41 U/L   20   ALT 0 - 44 U/L   16       Latest Ref Rng & Units 05/13/2022   10:33 AM 04/02/2022    4:44 AM 07/27/2021    1:02 PM  CBC  WBC 4.0 - 10.5 K/uL 17.4  13.9  6.0   Hemoglobin 12.0 - 15.0 g/dL 12.1  14.0  13.7   Hematocrit 36.0 - 46.0 % 36.0  42.1  42.6   Platelets 150 - 400 K/uL 292  274  248    HEMOGLOBIN A1C Lab Results  Component Value Date   HGBA1C 5.4 04/03/2022   MPG 108.28 04/03/2022   External labs:   Labs 07/17/2022:  Serum glucose 97 mg, BUN 18, creatinine 0.93, EGFR 68 mL, LFTs normal.  Total cholesterol 124, triglycerides 146, HDL 62, LDL 38.  A1c 5.7%.    TSH 2.100 12/18/2020  Allergies   Allergies  Allergen Reactions   Adhesive [Tape] Other (See Comments)    SKIN WILL TEAR EASILY!!!!   Brilinta [Ticagrelor] Shortness Of Breath   Statins Other (See Comments)    myalgia   Carvedilol     Unknown reaction    Diovan [Valsartan]     Tingling    Hydrochlorothiazide     Unknown reaction    Ibuprofen     Causes bp to go up   Keflex [Cephalexin]     confusion   Morphine And  Related     Hyper, ineffective    Norvasc [Amlodipine Besylate]     Numbness and tingling    Nsaids     Esophagus became red and swollen (tolerates meloxicam)    Prednisone     Elevated BP   Reglan [Metoclopramide]     Confusion, altered mental status   Singulair [Montelukast] Diarrhea   Temazepam Hives    Medications     Current Outpatient Medications:    acebutolol (SECTRAL) 200 MG capsule, Take 1 capsule (200 mg total) by mouth 2 (two) times daily., Disp: 180 capsule, Rfl: 3   aspirin EC 81 MG tablet, Take 81 mg by mouth daily., Disp: , Rfl:    cyanocobalamin (,VITAMIN B-12,) 1000 MCG/ML injection, Inject 1,000 mcg into the skin every 30 (thirty) days., Disp: , Rfl:    cyclobenzaprine (FLEXERIL) 10 MG tablet, Take 10 mg by mouth 3 (three) times daily as needed for muscle spasms. TAKES 1 TAB AT BEDTIME, Disp: , Rfl:    divalproex (DEPAKOTE ER) 250 MG 24 hr tablet, Take 1 tablet (250 mg total) by mouth daily., Disp: 90 tablet, Rfl: 3  Evolocumab (REPATHA SURECLICK) 657 MG/ML SOAJ, PA approved 10/26/2021 through 11/05/2022. Take as prescribed every 2 weeks., Disp: , Rfl:    hydrOXYzine (ATARAX/VISTARIL) 25 MG tablet, Take 25 mg by mouth 4 (four) times daily as needed for itching., Disp: , Rfl:    loratadine (CLARITIN) 10 MG tablet, Take 10 mg by mouth at bedtime., Disp: , Rfl:    losartan (COZAAR) 25 MG tablet, Take 1 tablet (25 mg total) by mouth every evening., Disp: 30 tablet, Rfl: 2   nitroGLYCERIN (NITROSTAT) 0.4 MG SL tablet, DISSOLVE 1 TABLET SUBLINGUALLY AS NEEDED FOR CHEST PAIN, MAY REPEAT EVERY5 MINUTES. AFTER 3 CALL 911. (Patient taking differently: Place 0.4 mg under the tongue every 5 (five) minutes as needed for chest pain.), Disp: 25 tablet, Rfl: 0   ondansetron (ZOFRAN) 4 MG tablet, Take 1 tablet (4 mg total) by mouth every 6 (six) hours as needed for nausea or vomiting., Disp: 30 tablet, Rfl: 0   oxyCODONE (ROXICODONE) 5 MG immediate release tablet, Take 1-2 tablets  (5-10 mg total) by mouth every 4 (four) hours as needed for moderate pain or severe pain., Disp: 40 tablet, Rfl: 0   pantoprazole (PROTONIX) 20 MG tablet, Take 1 tablet (20 mg total) by mouth daily., Disp: 90 tablet, Rfl: 3   SYMBICORT 80-4.5 MCG/ACT inhaler, Inhale 2 puffs into the lungs., Disp: , Rfl:    traZODone (DESYREL) 50 MG tablet, Take 100 mg by mouth at bedtime., Disp: , Rfl:    Vitamin D, Ergocalciferol, (DRISDOL) 1.25 MG (50000 UT) CAPS capsule, Take 50,000 Units by mouth every Monday., Disp: , Rfl:    acetaminophen (TYLENOL) 500 MG tablet, Take 1,000 mg by mouth every 8 (eight) hours as needed for moderate pain or headache., Disp: , Rfl:    isosorbide mononitrate (IMDUR) 30 MG 24 hr tablet, Take 1 tablet (30 mg total) by mouth daily. (Patient not taking: Reported on 08/20/2022), Disp: 30 tablet, Rfl: 3  Radiology:   Chest X-Ray 01/09/2017: IMPRESSION: No active cardiopulmonary disease.  MR Angio Head WO Contrast 06/13/2019: IMPRESSION: 1. No evidence of ischemia on repeat diffusion imaging. 2. No abnormal enhancement following contrast. 3. Intracranial MRA is within normal limits.  EKG 07/05/19:  EKG 08/20/2022: Normal sinus rhythm at a rate of 84 bpm, leftward axis, incomplete right bundle branch block.  Poor R progression, probably normal variant.  Low-voltage complexes, consider pulmonary disease pattern.  Compared to 07/21/2022, no significant change.   Cardiac Studies:   7 day event monitor 07/13/2018-07/19/2018: Dominant rhythm: Normal sinus rhythm.  11 patient activated events occurred with symptoms of chest pain, shortness of breath, rapid heart rate, correlating with sinus tachycardia.  Fastest tachycardia episode was day one at 1400 with 133 beats per without reported symptoms.  When autodetect event of sinus rhythm with 6 beats of NSVT on day 2 10:51 AM with symptoms of chest pain, shortness of breath.  No A. fib or SVT was noted.  Coronary angiogram 10/07/2018: No  change from 08/02/2018 except D1 stenosis 50-60%, DFR negative. Normal right heart catheterization. The left ventricular systolic function is normal. LV end diastolic pressure is normal. Superdominant large RCA. Prox RCA to Mid RCA lesion is 80% stenosed S/P STENT ORSIRO DES 4.0X30 with 0% residual stenosis. Ost RPDA lesion is 50% stenosed. Small LAD ends before reaching apex.  PCV ECHOCARDIOGRAM COMPLETE 11/01/2020 Left ventricle cavity is normal in size and wall thickness. Normal global wall motion. Normal LV systolic function with EF 55%. Doppler evidence of grade I (  impaired) diastolic dysfunction, normal LAP. Calculated EF 55%. Left atrial cavity is normal in size. Aneurysmal interatrial septum without 2D or color Doppler evidence of shunting. Trileaflet aortic valve.  Trace aortic regurgitation. Mild (Grade I) mitral regurgitation. Normal right atrial pressure. 4X6 cm echolucent area seen in liver, likely s/o liver cyst. Consider dedicated liver ultrasound. Compared to previous study in 2014, liver finding is new. Correlates with MRI of the abdomen done 01/11/2019 revealing multiple liver cysts.  Lexiscan Nuclear stress test 03/31/2021: Nondiagnostic ECG stress due to pharmacologic stress. Myocardial perfusion is abnormal. There is a fixed moderate defect in the lateral and inferior regions ([place]). There is a reversible moderate defect in the septal and apical regions (Ischemia). Overall LV systolic function is abnormal with Mild inferoseptal and inferolateral severe hypokinesis. Stress LV EF: 41%. High risk study.  Prior study done on 07/22/2018 revealed a small inferolateral and apical defect consistent with ischemia and EF was 45%.  Left heart catheterization 04/08/2021: LV: 153/9, EDP 14 mmHg.  Ao 151/78, mean 109 mmHg.  No pressure gradient across the aortic valve. LM: Large vessel, smooth and normal. LAD: Large vessel in the proximal and, gives origin to a very large D1 and a large D2  and LAD itself is very small. CX: Moderate caliber vessel, smooth and normal. RI: Very tiny vessel with diffuse disease in the proximal segment. RCA: Dominant vessel.  Gives origin to large PL branch and large PDA.  Proximal to previously placed 3.0 x 30 mm Orsiro stent on 08/02/2020 is patent with mild neointimal hyperplasia.  Proximal to the stent there is a 80% stenosis.  Successful stenting with 4.0 x 16 mm Synergy XD DES = 0% stenosis. Ostial PDA 80% calcific stenosis, successful stenting with 2.5 x 12 mm Synergy XD DES = 0% stenosis.     Mid RCA 3.0 x 30 mm Orsiro stent on 08/02/2020; Prox RCA 4.0 x 16 mm Synergy XD 04/08/2021  EKG   EKG 08/20/2022: Normal sinus rhythm at a rate of 84 bpm, leftward axis, incomplete right bundle branch block.  Poor R progression, probably normal variant.  Low-voltage complexes, consider pulmonary disease pattern.  Compared to 07/21/2022, no significant change.  Assessment     ICD-10-CM   1. Coronary artery disease of native artery of native heart with stable angina pectoris (HCC)  I25.118 EKG 12-Lead    CBC    Basic metabolic panel    2. Pure hypercholesterolemia  E78.00     3. Essential hypertension  I10       No orders of the defined types were placed in this encounter.   There are no discontinued medications.  Recommendations:   NURA CAHOON  is a 66 y.o. female with raynaud's disease and GERD, CAD SP angioplasty to RCA on 08/02/2018 and again in 2022 for stenosis in the proximal segment of the previous stent, hyperlipidemia, has not been able to tolerate statins now on Repatha, hypertension is now being seen on an urgent basis due to recurrence of chest pain similar to her prior angina pectoris.  1. Coronary artery disease of native artery of native heart with stable angina pectoris [facility]) Patient with recurrence of angina pectoris, EKG is normal, she has been using sublingual nitroglycerin almost 1 to 2 tablets on a daily basis for the  past 2 weeks especially in the last 1 week.  Although her EKG is normal, she is extremely high risk features and she has presented similarly in the past and with cardiac  catheterization was found to have no lesions.  Hence best option is to proceed with cardiac catheterization.  Schedule for cardiac catheterization, and possible angioplasty. We discussed regarding risks, benefits, alternatives to this including stress testing, CTA and continued medical therapy. Patient wants to proceed. Understands <1-2% risk of death, stroke, MI, urgent CABG, bleeding, infection, renal failure but not limited to these.   2. Essential hypertension Blood pressure is well controlled on acebutolol and losartan, continue the same.  3. Pure hypercholesterolemia Presently on Repatha, lipids under excellent control.  Would like to see her back in 4 to 6 weeks for follow-up following cardiac catheterization.  Her husband is present and all questions answered      Adrian Prows, MD, [facility] 08/20/2022, 11:04 PM Office: [phone] Fax: [phone] Pager: [phone]

## 2022-08-21 DIAGNOSIS — I25118 Atherosclerotic heart disease of native coronary artery with other forms of angina pectoris: Secondary | ICD-10-CM | POA: Diagnosis not present

## 2022-08-22 LAB — BASIC METABOLIC PANEL
BUN/Creatinine Ratio: 19 (ref 12–28)
BUN: 21 mg/dL (ref 8–27)
CO2: 25 mmol/L (ref 20–29)
Calcium: 9.7 mg/dL (ref 8.7–10.3)
Chloride: 97 mmol/L (ref 96–106)
Creatinine, Ser: 1.12 mg/dL — ABNORMAL HIGH (ref 0.57–1.00)
Glucose: 105 mg/dL — ABNORMAL HIGH (ref 70–99)
Potassium: 4.8 mmol/L (ref 3.5–5.2)
Sodium: 138 mmol/L (ref 134–144)
eGFR: 54 mL/min/{1.73_m2} — ABNORMAL LOW (ref >59)

## 2022-08-22 LAB — CBC
Hematocrit: 40.6 % (ref 34.0–46.6)
Hemoglobin: 13.3 g/dL (ref 11.1–15.9)
MCH: 30.5 pg (ref 26.6–33.0)
MCHC: 32.8 g/dL (ref 31.5–35.7)
MCV: 93 fL (ref 79–97)
Platelets: 392 10*3/uL (ref 150–450)
RBC: 4.36 x10E6/uL (ref 3.77–5.28)
RDW: 12.4 % (ref 11.7–15.4)
WBC: 10.7 10*3/uL (ref 3.4–10.8)

## 2022-09-03 ENCOUNTER — Other Ambulatory Visit: Payer: Self-pay | Admitting: Cardiology

## 2022-09-04 ENCOUNTER — Encounter (HOSPITAL_COMMUNITY)
Admission: RE | Disposition: A | Discharge status: Home / Self Care | Payer: Self-pay | Source: Home / Self Care | Attending: Cardiology

## 2022-09-04 ENCOUNTER — Other Ambulatory Visit: Payer: Self-pay

## 2022-09-04 ENCOUNTER — Ambulatory Visit (HOSPITAL_COMMUNITY)
Admission: RE | Admit: 2022-09-04 | Discharge: 2022-09-04 | Disposition: A | Discharge status: Home / Self Care | Payer: Medicare Other | Attending: Cardiology | Admitting: Cardiology

## 2022-09-04 DIAGNOSIS — E7800 Pure hypercholesterolemia, unspecified: Secondary | ICD-10-CM | POA: Insufficient documentation

## 2022-09-04 DIAGNOSIS — Z9861 Coronary angioplasty status: Secondary | ICD-10-CM | POA: Diagnosis not present

## 2022-09-04 DIAGNOSIS — Z8249 Family history of ischemic heart disease and other diseases of the circulatory system: Secondary | ICD-10-CM | POA: Diagnosis not present

## 2022-09-04 DIAGNOSIS — I129 Hypertensive chronic kidney disease with stage 1 through stage 4 chronic kidney disease, or unspecified chronic kidney disease: Secondary | ICD-10-CM | POA: Insufficient documentation

## 2022-09-04 DIAGNOSIS — K219 Gastro-esophageal reflux disease without esophagitis: Secondary | ICD-10-CM | POA: Diagnosis not present

## 2022-09-04 DIAGNOSIS — R079 Chest pain, unspecified: Secondary | ICD-10-CM | POA: Diagnosis present

## 2022-09-04 DIAGNOSIS — Z955 Presence of coronary angioplasty implant and graft: Secondary | ICD-10-CM | POA: Insufficient documentation

## 2022-09-04 DIAGNOSIS — I2510 Atherosclerotic heart disease of native coronary artery without angina pectoris: Secondary | ICD-10-CM | POA: Diagnosis not present

## 2022-09-04 DIAGNOSIS — Z841 Family history of disorders of kidney and ureter: Secondary | ICD-10-CM | POA: Insufficient documentation

## 2022-09-04 DIAGNOSIS — I25119 Atherosclerotic heart disease of native coronary artery with unspecified angina pectoris: Secondary | ICD-10-CM | POA: Insufficient documentation

## 2022-09-04 DIAGNOSIS — Z950 Presence of cardiac pacemaker: Secondary | ICD-10-CM | POA: Diagnosis not present

## 2022-09-04 DIAGNOSIS — N189 Chronic kidney disease, unspecified: Secondary | ICD-10-CM | POA: Insufficient documentation

## 2022-09-04 HISTORY — PX: LEFT HEART CATH AND CORONARY ANGIOGRAPHY: CATH118249

## 2022-09-04 SURGERY — LEFT HEART CATH AND CORONARY ANGIOGRAPHY
Anesthesia: LOCAL

## 2022-09-04 MED ORDER — MIDAZOLAM HCL 2 MG/2ML IJ SOLN
INTRAMUSCULAR | Status: AC
  Filled 2022-09-04: qty 2

## 2022-09-04 MED ORDER — VERAPAMIL HCL 2.5 MG/ML IV SOLN
INTRAVENOUS | Status: DC | PRN
  Administered 2022-09-04 (×2): 5 mL via INTRA_ARTERIAL

## 2022-09-04 MED ORDER — SODIUM CHLORIDE 0.9 % IV SOLN: 250.0000 mL | INTRAVENOUS | Status: DC | PRN

## 2022-09-04 MED ORDER — ONDANSETRON HCL 4 MG/2ML IJ SOLN: 4.0000 mg | Freq: Four times a day (QID) | INTRAMUSCULAR | Status: DC | PRN

## 2022-09-04 MED ORDER — SODIUM CHLORIDE 0.9% FLUSH: 3.0000 mL | INTRAVENOUS | Status: DC | PRN

## 2022-09-04 MED ORDER — ISOSORBIDE MONONITRATE ER 30 MG PO TB24: 30.0000 mg | ORAL_TABLET | Freq: Every day | ORAL | 3 refills | Status: DC

## 2022-09-04 MED ORDER — FENTANYL CITRATE (PF) 100 MCG/2ML IJ SOLN
INTRAMUSCULAR | Status: AC
  Filled 2022-09-04: qty 2

## 2022-09-04 MED ORDER — HEPARIN (PORCINE) IN NACL 1000-0.9 UT/500ML-% IV SOLN
INTRAVENOUS | Status: AC
  Filled 2022-09-04: qty 1000

## 2022-09-04 MED ORDER — MIDAZOLAM HCL 2 MG/2ML IJ SOLN
INTRAMUSCULAR | Status: DC | PRN
  Administered 2022-09-04: 2 mg via INTRAVENOUS

## 2022-09-04 MED ORDER — SODIUM CHLORIDE 0.9 % WEIGHT BASED INFUSION
3.0000 mL/kg/h | INTRAVENOUS | Status: DC
  Administered 2022-09-04: 3 mL/kg/h via INTRAVENOUS

## 2022-09-04 MED ORDER — SODIUM CHLORIDE 0.9 % WEIGHT BASED INFUSION: 1.0000 mL/kg/h | INTRAVENOUS | Status: DC

## 2022-09-04 MED ORDER — HEPARIN (PORCINE) IN NACL 1000-0.9 UT/500ML-% IV SOLN
INTRAVENOUS | Status: DC | PRN
  Administered 2022-09-04 (×2): 500 mL

## 2022-09-04 MED ORDER — SODIUM CHLORIDE 0.9% FLUSH: 3.0000 mL | Freq: Two times a day (BID) | INTRAVENOUS | Status: DC

## 2022-09-04 MED ORDER — SODIUM CHLORIDE 0.9 % WEIGHT BASED INFUSION: 3.0000 mL/kg/h | INTRAVENOUS | Status: DC

## 2022-09-04 MED ORDER — LIDOCAINE HCL (PF) 1 % IJ SOLN
INTRAMUSCULAR | Status: AC
  Filled 2022-09-04: qty 30

## 2022-09-04 MED ORDER — FENTANYL CITRATE (PF) 100 MCG/2ML IJ SOLN
INTRAMUSCULAR | Status: DC | PRN
  Administered 2022-09-04 (×3): 50 ug via INTRAVENOUS

## 2022-09-04 MED ORDER — NITROGLYCERIN 1 MG/10 ML FOR IR/CATH LAB
INTRA_ARTERIAL | Status: DC | PRN
  Administered 2022-09-04: 200 ug via INTRACORONARY

## 2022-09-04 MED ORDER — LIDOCAINE HCL (PF) 1 % IJ SOLN
INTRAMUSCULAR | Status: DC | PRN
  Administered 2022-09-04: 2 mL

## 2022-09-04 MED ORDER — VERAPAMIL HCL 2.5 MG/ML IV SOLN
INTRAVENOUS | Status: AC
  Filled 2022-09-04: qty 2

## 2022-09-04 MED ORDER — ASPIRIN 81 MG PO CHEW: 81.0000 mg | CHEWABLE_TABLET | ORAL | Status: DC

## 2022-09-04 MED ORDER — NITROGLYCERIN 1 MG/10 ML FOR IR/CATH LAB
INTRA_ARTERIAL | Status: AC
  Filled 2022-09-04: qty 10

## 2022-09-04 MED ORDER — HEPARIN SODIUM (PORCINE) 1000 UNIT/ML IJ SOLN
INTRAMUSCULAR | Status: DC | PRN
  Administered 2022-09-04: 4000 [IU] via INTRAVENOUS

## 2022-09-04 MED ORDER — ACETAMINOPHEN 325 MG PO TABS: 650.0000 mg | ORAL_TABLET | ORAL | Status: DC | PRN

## 2022-09-04 MED ORDER — HEPARIN SODIUM (PORCINE) 1000 UNIT/ML IJ SOLN
INTRAMUSCULAR | Status: AC
  Filled 2022-09-04: qty 10

## 2022-09-04 MED ORDER — IOHEXOL 350 MG/ML SOLN
INTRAVENOUS | Status: DC | PRN
  Administered 2022-09-04: 65 mL

## 2022-09-04 SURGICAL SUPPLY — 10 items
CATH OPTITORQUE TIG 4.0 5F (CATHETERS) IMPLANT
DEVICE RAD COMP TR BAND LRG (VASCULAR PRODUCTS) IMPLANT
GLIDESHEATH SLEND A-KIT 6F 22G (SHEATH) IMPLANT
GUIDEWIRE INQWIRE 1.5J.035X260 (WIRE) IMPLANT
INQWIRE 1.5J .035X260CM (WIRE) ×1
KIT HEART LEFT (KITS) ×1 IMPLANT
PACK CARDIAC CATHETERIZATION (CUSTOM PROCEDURE TRAY) ×1 IMPLANT
SHEATH PROBE COVER 6X72 (BAG) IMPLANT
TRANSDUCER W/STOPCOCK (MISCELLANEOUS) ×1 IMPLANT
TUBING CIL FLEX 10 FLL-RA (TUBING) ×1 IMPLANT

## 2022-09-04 NOTE — Interval H&P Note (Signed)
History and Physical Interval Note:  09/04/2022 7:45 AM  Tonya Boyd  has presented today for surgery, with the diagnosis of chest pain, DOE.  The various methods of treatment have been discussed with the patient and family. After consideration of risks, benefits and other options for treatment, the patient has consented to  Procedure(s): LEFT HEART CATH AND CORONARY ANGIOGRAPHY (N/A) and coronary angioplasty as a surgical intervention.  The patient's history has been reviewed, patient examined, no change in status, stable for surgery.  I have reviewed the patient's chart and labs.  Questions were answered to the patient's satisfaction.   Cath Lab Visit (complete for each Cath Lab visit)  Clinical Evaluation Leading to the Procedure:   ACS: No.  Non-ACS:    Anginal Classification: CCS III  Anti-ischemic medical therapy: Maximal Therapy (2 or more classes of medications)  Non-Invasive Test Results: No non-invasive testing performed  Prior CABG: No previous CABG   Tonya Boyd

## 2022-09-04 NOTE — Progress Notes (Signed)
TR band removed, new dressing along w/ arm brace in place. No s/s of bleeding or hematoma noted. will continue to monitor.

## 2022-09-08 ENCOUNTER — Encounter (HOSPITAL_COMMUNITY): Payer: Self-pay | Admitting: Cardiology

## 2022-09-11 DIAGNOSIS — M62561 Muscle wasting and atrophy, not elsewhere classified, right lower leg: Secondary | ICD-10-CM | POA: Diagnosis not present

## 2022-09-11 DIAGNOSIS — M25661 Stiffness of right knee, not elsewhere classified: Secondary | ICD-10-CM | POA: Diagnosis not present

## 2022-09-11 DIAGNOSIS — R269 Unspecified abnormalities of gait and mobility: Secondary | ICD-10-CM | POA: Diagnosis not present

## 2022-09-11 DIAGNOSIS — M25561 Pain in right knee: Secondary | ICD-10-CM | POA: Diagnosis not present

## 2022-09-24 ENCOUNTER — Other Ambulatory Visit: Payer: Self-pay | Admitting: Cardiology

## 2022-09-24 ENCOUNTER — Telehealth: Payer: Self-pay | Admitting: Cardiology

## 2022-09-24 NOTE — Telephone Encounter (Signed)
Patient requesting refill on repatha. Please let her know once this has been completed.

## 2022-09-24 NOTE — Telephone Encounter (Signed)
sent

## 2022-09-29 DIAGNOSIS — M5416 Radiculopathy, lumbar region: Secondary | ICD-10-CM | POA: Diagnosis not present

## 2022-09-29 DIAGNOSIS — M48062 Spinal stenosis, lumbar region with neurogenic claudication: Secondary | ICD-10-CM | POA: Diagnosis not present

## 2022-09-29 DIAGNOSIS — M5136 Other intervertebral disc degeneration, lumbar region: Secondary | ICD-10-CM | POA: Diagnosis not present

## 2022-09-29 DIAGNOSIS — R1032 Left lower quadrant pain: Secondary | ICD-10-CM | POA: Diagnosis not present

## 2022-09-29 DIAGNOSIS — M1612 Unilateral primary osteoarthritis, left hip: Secondary | ICD-10-CM | POA: Diagnosis not present

## 2022-09-29 DIAGNOSIS — R1030 Lower abdominal pain, unspecified: Secondary | ICD-10-CM | POA: Diagnosis not present

## 2022-10-02 ENCOUNTER — Ambulatory Visit: Payer: Medicare Other | Admitting: Cardiology

## 2022-10-02 ENCOUNTER — Other Ambulatory Visit: Payer: Self-pay

## 2022-10-02 MED ORDER — REPATHA SURECLICK 140 MG/ML ~~LOC~~ SOAJ: SUBCUTANEOUS | 3 refills | Status: DC

## 2022-10-06 DIAGNOSIS — M1612 Unilateral primary osteoarthritis, left hip: Secondary | ICD-10-CM | POA: Diagnosis not present

## 2022-10-14 DIAGNOSIS — E538 Deficiency of other specified B group vitamins: Secondary | ICD-10-CM | POA: Diagnosis not present

## 2022-10-15 ENCOUNTER — Ambulatory Visit: Payer: Medicare Other | Admitting: Cardiology

## 2022-10-22 ENCOUNTER — Encounter: Payer: Self-pay | Admitting: Cardiology

## 2022-10-22 ENCOUNTER — Ambulatory Visit: Payer: Medicare Other | Admitting: Cardiology

## 2022-10-22 VITALS — BP 119/80 | HR 78 | Ht 64.0 in | Wt 207.0 lb

## 2022-10-22 DIAGNOSIS — E7800 Pure hypercholesterolemia, unspecified: Secondary | ICD-10-CM | POA: Diagnosis not present

## 2022-10-22 DIAGNOSIS — I10 Essential (primary) hypertension: Secondary | ICD-10-CM | POA: Diagnosis not present

## 2022-10-22 DIAGNOSIS — I25118 Atherosclerotic heart disease of native coronary artery with other forms of angina pectoris: Secondary | ICD-10-CM

## 2022-10-22 MED ORDER — ISOSORBIDE MONONITRATE ER 30 MG PO TB24: 30.0000 mg | ORAL_TABLET | Freq: Every day | ORAL | 3 refills | Status: DC

## 2022-10-22 MED ORDER — LOSARTAN POTASSIUM 25 MG PO TABS: 25.0000 mg | ORAL_TABLET | Freq: Every evening | ORAL | 3 refills | Status: DC

## 2022-10-22 NOTE — Progress Notes (Signed)
Primary Physician/Referring:  Merrilee Seashore, MD  Patient ID: Tonya Boyd, female    DOB: May 19, 1956, 67 y.o.   MRN: [id number]  Chief Complaint  Patient presents with   Coronary artery disease of native artery of native heart wi   Follow-up    HPI:    Tonya Boyd  is a 67 y.o. female with raynaud's disease and GERD, CAD SP angioplasty to RCA on 08/02/2018 and again in 2022 for stenosis in the proximal segment of the previous stent, hyperlipidemia, has not been able to tolerate statins now on Repatha, hypertension since 6 weeks ago for chest pain and underwent cardiac catheterization on 09/04/2022 revealing widely patent stents and recommended Imdur and consider Ranexa and also to evaluate for noncardiac causes of chest pain.  Patient states that since starting isosorbide mononitrate, chest pain symptoms have essentially resolved.  She remains asymptomatic however has now developed arthritis issues.  Past Medical History:  Diagnosis Date   Angina pectoris ([place])    Anxiety    Asthma    CAD (coronary artery disease), native coronary artery 08/02/2018   a.) [facility] 08/02/2018: EF 45%, super-dominant large RCA, 80% p-mRCA, 50% oRPDA, 80% D1; mPA 13, mPCWP 8, PA sat 81%, Ao sat 99, CO 6.95, CI 3.65 --> 4.0 x 30 mm Orsiro DES x1 to [facility]; b.) [facility] 10/07/2018: 50-60% oD1 (FFR 0.96), diffuse HG stenosis prox seg very small (<1 mm) RI --> med mgmt; c.) [facility] 04/08/2021: 80% pRCA (4.0 x 16 mm Synergy XD DES) and 80% oPDA (2.5 x 12 mm Synergy XD DES)   Chronic bronchitis (HCC)    Chronic kidney disease    Complication of anesthesia    a.) postoperative nausea only (no vomiting); also "shakes"; per patient relieved w/ IV diphenhydramine   COPD (chronic obstructive pulmonary disease) ([place])    Depression    Diastolic dysfunction [id number]   a.) TTE 07/23/2018: EF 55-60%, GLS -19.6%, G1DD; b.) TTE 11/01/2020: EF 55%, triv AR, G1DD   Family history of adverse reaction to anesthesia     "cousin stopped breathing; he was allergic to the anesthesia" (08/02/2018)   GERD (gastroesophageal reflux disease)    High cholesterol    History of gout    History of hiatal hernia    History of kidney stones    Hypertension 07/2018   Interstitial cystitis    Long term current use of antithrombotics/antiplatelets    a.) daily DAPT therapy (ASA + clopidogrel)   Migraines    NSVT (nonsustained ventricular tachycardia) ([place]) 07/2018   a.) holter 07/13/2018: 6 beat run NSVT; maximum rate 133 bpm.   OA (osteoarthritis)    Raynaud's disease    Stroke-like episode 06/08/2019   a.) speech difficulty, right sided weakness/paraesthesias; unclear etiology ?? possibly conversion disorder. MRI brain x 2 negative for stroke.   Past Surgical History:  Procedure Laterality Date   ABDOMINAL HYSTERECTOMY     ANTERIOR CERVICAL DECOMP/DISCECTOMY FUSION     APPENDECTOMY     AUGMENTATION MAMMAPLASTY Bilateral    BACK SURGERY     CORONARY ANGIOPLASTY WITH STENT PLACEMENT  08/02/2018   CORONARY STENT INTERVENTION N/A 08/02/2018   Procedure: CORONARY STENT INTERVENTION;  Surgeon: Adrian Prows, MD;  Location: [place] [facility];  Service: Cardiovascular;  Laterality: N/A;  RCA    CORONARY STENT INTERVENTION N/A 04/08/2021   Procedure: CORONARY STENT INTERVENTION;  Surgeon: Adrian Prows, MD;  Location: [place] [facility];  Service: Cardiovascular;  Laterality: N/A;  RCA and PDA  CYSTOSCOPY W/ STONE MANIPULATION     INTRAVASCULAR PRESSURE WIRE/FFR STUDY N/A 10/07/2018   Procedure: INTRAVASCULAR PRESSURE WIRE/FFR STUDY;  Surgeon: Adrian Prows, MD;  Location: [place] [facility];  Service: Cardiovascular;  Laterality: N/A;   KNEE ARTHROSCOPY Right    LAPAROSCOPIC CHOLECYSTECTOMY     LEFT HEART CATH AND CORONARY ANGIOGRAPHY N/A 10/07/2018   Procedure: LEFT HEART CATH AND CORONARY ANGIOGRAPHY;  Surgeon: Adrian Prows, MD;  Location: [place] [facility];  Service: Cardiovascular;  Laterality: N/A;   LEFT HEART CATH AND  CORONARY ANGIOGRAPHY N/A 04/08/2021   Procedure: LEFT HEART CATH AND CORONARY ANGIOGRAPHY;  Surgeon: Adrian Prows, MD;  Location: [place] [facility];  Service: Cardiovascular;  Laterality: N/A;   LEFT HEART CATH AND CORONARY ANGIOGRAPHY N/A 09/04/2022   Procedure: LEFT HEART CATH AND CORONARY ANGIOGRAPHY;  Surgeon: Adrian Prows, MD;  Location: [place] [facility];  Service: Cardiovascular;  Laterality: N/A;   REPAIR ANKLE LIGAMENT Left    "tied up ligament; had tore up qthing in my ankle"   REPLACEMENT TOTAL KNEE Right    RIGHT/LEFT HEART CATH AND CORONARY ANGIOGRAPHY N/A 08/02/2018   Procedure: RIGHT/LEFT HEART CATH AND CORONARY ANGIOGRAPHY;  Surgeon: Adrian Prows, MD;  Location: [place] [facility];  Service: Cardiovascular;  Laterality: N/A;   SHOULDER SURGERY Bilateral    TONSILLECTOMY     TOTAL KNEE ARTHROPLASTY Right 05/12/2022   Procedure: TOTAL KNEE ARTHROPLASTY;  Surgeon: Corky Mull, MD;  Location: [facility];  Service: Orthopedics;  Laterality: Right;   TUBAL LIGATION     Family History  Problem Relation Age of Onset   Kidney failure Mother    Diabetes Mother    Heart disease Father    Heart attack Father    Colon cancer Brother    Liver cancer Brother    Heart disease Other    Arthritis Other    Cancer Other    Asthma Other    Diabetes Other    Kidney disease Other    Social History   Tobacco Use   Smoking status: Never   Smokeless tobacco: Never  Substance Use Topics   Alcohol use: Not Currently    Comment: 08/02/2018 "not since my 20's"   Marital Status: Married  ROS  Review of Systems  Cardiovascular:  Positive for chest pain. Negative for dyspnea on exertion and leg swelling.   Objective  Blood pressure 119/80, pulse 78, height 5' 4"$  (1.626 m), weight 207 lb (93.9 kg), SpO2 96 %.     10/22/2022   10:54 AM 09/04/2022   10:45 AM 09/04/2022   10:35 AM  Vitals with BMI  Height 5' 4"$     Weight 207 lbs    BMI [id number]    Systolic [id number]   Diastolic 80 70    Pulse 78 71 65   Orthostatic VS for the past 72 hrs (Last 3 readings):  Patient Position BP Location Cuff Size  10/22/22 1054 Sitting Left Arm Large     Physical Exam Vitals reviewed.  Constitutional:      Appearance: She is well-developed. She is obese.  Neck:     Vascular: No carotid bruit or JVD.  Cardiovascular:     Rate and Rhythm: Normal rate and regular rhythm.     Pulses: Normal pulses and intact distal pulses.     Heart sounds: Normal heart sounds. No murmur heard.    No gallop.  Pulmonary:     Effort: Pulmonary effort is normal. No respiratory  distress.  Abdominal:     General: Bowel sounds are normal.     Palpations: Abdomen is soft.  Musculoskeletal:     Right lower leg: No edema.     Left lower leg: No edema.    Laboratory examination:      Latest Ref Rng & Units 08/21/2022    8:07 AM 05/13/2022    8:56 AM 04/02/2022    4:44 AM  CMP  Glucose 70 - 99 mg/dL 105  196  135   BUN 8 - 27 mg/dL 21  13  13   $ Creatinine 0.57 - 1.00 mg/dL 1.12  0.90  1.06   Sodium 134 - 144 mmol/L 138  136  136   Potassium 3.5 - 5.2 mmol/L 4.8  4.5  3.9   Chloride 96 - 106 mmol/L 97  104  102   CO2 20 - 29 mmol/L 25  22  25   $ Calcium 8.7 - 10.3 mg/dL 9.7  9.2  9.0       Latest Ref Rng & Units 08/21/2022    8:07 AM 05/13/2022   10:33 AM 04/02/2022    4:44 AM  CBC  WBC 3.4 - 10.8 x10E3/uL 10.7  17.4  13.9   Hemoglobin 11.1 - 15.9 g/dL 13.3  12.1  14.0   Hematocrit 34.0 - 46.6 % 40.6  36.0  42.1   Platelets 150 - 450 x10E3/uL 392  292  274    HEMOGLOBIN A1C Lab Results  Component Value Date   HGBA1C 5.4 04/03/2022   MPG 108.28 04/03/2022   External labs:   Labs 07/17/2022:  Serum glucose 97 mg, BUN 18, creatinine 0.93, EGFR 68 mL, LFTs normal.  Total cholesterol 124, triglycerides 146, HDL 62, LDL 38.  A1c 5.7%.    TSH 2.100 12/18/2020  Allergies   Allergies  Allergen Reactions   Adhesive [Tape] Other (See Comments)    SKIN WILL TEAR EASILY!!!!   Brilinta  [Ticagrelor] Shortness Of Breath   Statins Other (See Comments)    myalgia   Carvedilol     Unknown reaction    Diovan [Valsartan]     Tingling    Hydrochlorothiazide     Unknown reaction    Ibuprofen     Causes bp to go up   Keflex [Cephalexin]     confusion   Morphine And Related     Hyper, ineffective    Norvasc [Amlodipine Besylate]     Numbness and tingling    Nsaids     Esophagus became red and swollen (tolerates meloxicam)    Prednisone     Elevated BP   Reglan [Metoclopramide]     Confusion, altered mental status   Singulair [Montelukast] Diarrhea   Temazepam Hives    Medications     Current Outpatient Medications:    acebutolol (SECTRAL) 200 MG capsule, Take 1 capsule (200 mg total) by mouth 2 (two) times daily., Disp: 180 capsule, Rfl: 3   acetaminophen (TYLENOL) 500 MG tablet, Take 1,000 mg by mouth every 8 (eight) hours as needed for moderate pain or headache., Disp: , Rfl:    aspirin EC 81 MG tablet, Take 81 mg by mouth daily., Disp: , Rfl:    cyanocobalamin (,VITAMIN B-12,) 1000 MCG/ML injection, Inject 1,000 mcg into the skin every 30 (thirty) days., Disp: , Rfl:    cyclobenzaprine (FLEXERIL) 10 MG tablet, Take 10 mg by mouth at bedtime., Disp: , Rfl:    divalproex (DEPAKOTE ER) 250 MG 24 hr tablet,  Take 1 tablet (250 mg total) by mouth daily. (Patient taking differently: Take 250 mg by mouth every morning.), Disp: 90 tablet, Rfl: 3   Evolocumab (REPATHA SURECLICK) [id number] MG/ML SOAJ, INJECT 140 MG INTO THE SKIN EVERY 2 WEEKS., Disp: 2 mL, Rfl: 3   hydrOXYzine (ATARAX/VISTARIL) 25 MG tablet, Take 25 mg by mouth 4 (four) times daily as needed for itching., Disp: , Rfl:    loratadine (CLARITIN) 10 MG tablet, Take 10 mg by mouth at bedtime., Disp: , Rfl:    nitroGLYCERIN (NITROSTAT) 0.4 MG SL tablet, DISSOLVE 1 TABLET SUBLINGUALLY AS NEEDED FOR CHEST PAIN, MAY REPEAT EVERY 5 MINUTES. AFTER 3 CALL 911., Disp: 25 tablet, Rfl: 0   ondansetron (ZOFRAN) 4 MG tablet, Take 1  tablet (4 mg total) by mouth every 6 (six) hours as needed for nausea or vomiting., Disp: 30 tablet, Rfl: 0   pantoprazole (PROTONIX) 20 MG tablet, Take 1 tablet (20 mg total) by mouth daily., Disp: 90 tablet, Rfl: 3   SYMBICORT 80-4.5 MCG/ACT inhaler, Inhale 2 puffs into the lungs daily as needed (Coughing)., Disp: , Rfl:    traZODone (DESYREL) 50 MG tablet, Take 100 mg by mouth at bedtime., Disp: , Rfl:    Vitamin D, Ergocalciferol, (DRISDOL) 1.25 MG (50000 UT) CAPS capsule, Take 50,000 Units by mouth every Monday., Disp: , Rfl:    isosorbide mononitrate (IMDUR) 30 MG 24 hr tablet, Take 1 tablet (30 mg total) by mouth daily., Disp: 90 tablet, Rfl: 3   losartan (COZAAR) 25 MG tablet, Take 1 tablet (25 mg total) by mouth every evening., Disp: 90 tablet, Rfl: 3  Radiology:   Chest X-Ray 01/09/2017: IMPRESSION: No active cardiopulmonary disease.  MR Angio Head WO Contrast 06/13/2019: IMPRESSION: 1. No evidence of ischemia on repeat diffusion imaging. 2. No abnormal enhancement following contrast. 3. Intracranial MRA is within normal limits.  EKG 07/05/19:  EKG 08/20/2022: Normal sinus rhythm at a rate of 84 bpm, leftward axis, incomplete right bundle branch block.  Poor R progression, probably normal variant.  Low-voltage complexes, consider pulmonary disease pattern.  Compared to 07/21/2022, no significant change.   Cardiac Studies:   7 day event monitor 07/13/2018-07/19/2018: Dominant rhythm: Normal sinus rhythm.  11 patient activated events occurred with symptoms of chest pain, shortness of breath, rapid heart rate, correlating with sinus tachycardia.  Fastest tachycardia episode was day one at 1400 with 133 beats per without reported symptoms.  When autodetect event of sinus rhythm with 6 beats of NSVT on day 2 10:51 AM with symptoms of chest pain, shortness of breath.  No A. fib or SVT was noted.  Coronary angiogram 10/07/2018: No change from 08/02/2018 except D1 stenosis 50-60%, DFR  negative. Normal right heart catheterization. The left ventricular systolic function is normal. LV end diastolic pressure is normal. Superdominant large RCA. Prox RCA to Mid RCA lesion is 80% stenosed S/P STENT ORSIRO DES 4.0X30 with 0% residual stenosis. Ost RPDA lesion is 50% stenosed. Small LAD ends before reaching apex.  PCV ECHOCARDIOGRAM COMPLETE 11/01/2020 Left ventricle cavity is normal in size and wall thickness. Normal global wall motion. Normal LV systolic function with EF 55%. Doppler evidence of grade I (impaired) diastolic dysfunction, normal LAP. Calculated EF 55%. Left atrial cavity is normal in size. Aneurysmal interatrial septum without 2D or color Doppler evidence of shunting. Trileaflet aortic valve.  Trace aortic regurgitation. Mild (Grade I) mitral regurgitation. Normal right atrial pressure. 4X6 cm echolucent area seen in liver, likely s/o liver cyst. Consider dedicated  liver ultrasound. Compared to previous study in 2014, liver finding is new. Correlates with MRI of the abdomen done 01/11/2019 revealing multiple liver cysts.  Lexiscan Nuclear stress test 03/31/2021: Nondiagnostic ECG stress due to pharmacologic stress. Myocardial perfusion is abnormal. There is a fixed moderate defect in the lateral and inferior regions ([place]). There is a reversible moderate defect in the septal and apical regions (Ischemia). Overall LV systolic function is abnormal with Mild inferoseptal and inferolateral severe hypokinesis. Stress LV EF: 41%. High risk study.  Prior study done on 07/22/2018 revealed a small inferolateral and apical defect consistent with ischemia and EF was 45%.  Left Heart Catheterization 09/04/22:    LV: 151/4, EDP 20 mmHg.  Ao 152/72, mean 109 mmHg.  No pressure gradient across the aortic valve. LVEF 50-55%.    LM: Large vessel, smooth and normal. LAD: Large vessel in the proximal and, gives origin to a very large D1 & D2 and a large SP-1. LAD takes the course of  D3 distally. CX: Moderate caliber vessel, smooth and normal. RI: Very tiny vessel, normal. RCA: Dominant vessel.  Gives origin to large PL branch and large PDA.  Mid RCA 4.0 x 30 mm Orsiro stent (08/02/2020) and a  placed 04/08/2021 widely patent.  Ostial and proximal 4.0 x 16 mm and ostial PDA  2.5 x 12 mm Synergy XD DES is widely patent.    Mid RCA 3.0 x 30 mm Orsiro stent on 08/02/2020; Prox RCA 4.0 x 16 mm Synergy XD 04/08/2021   Impression: Widely patent coronary stents placed in the right coronary artery from 2021 and 2022.  Evaluate for noncardiac causes of chest pain.  Will initiate therapy with isosorbide mononitrate for now.  We could also try Ranexa if symptoms are persistent and GI etiology is not found.   EKG   EKG 08/20/2022: Normal sinus rhythm at a rate of 84 bpm, leftward axis, incomplete right bundle branch block.  Poor R progression, probably normal variant.  Low-voltage complexes, consider pulmonary disease pattern.  Compared to 07/21/2022, no significant change.  Assessment     ICD-10-CM   1. Coronary artery disease of native artery of native heart with stable angina pectoris (HCC)  I25.118 isosorbide mononitrate (IMDUR) 30 MG 24 hr tablet    2. Essential hypertension  I10 losartan (COZAAR) 25 MG tablet    3. Pure hypercholesterolemia  E78.00       Meds ordered this encounter  Medications   isosorbide mononitrate (IMDUR) 30 MG 24 hr tablet    Sig: Take 1 tablet (30 mg total) by mouth daily.    Dispense:  90 tablet    Refill:  3   losartan (COZAAR) 25 MG tablet    Sig: Take 1 tablet (25 mg total) by mouth every evening.    Dispense:  90 tablet    Refill:  3    Medications Discontinued During This Encounter  Medication Reason   HYDROcodone-acetaminophen (NORCO/VICODIN) 5-325 MG tablet Patient Preference   isosorbide mononitrate (IMDUR) 30 MG 24 hr tablet Reorder   losartan (COZAAR) 25 MG tablet Reorder    Recommendations:   Tonya Boyd  is a 67  y.o. female with raynaud's disease and GERD, CAD SP angioplasty to RCA on 08/02/2018 and again in 2022 for stenosis in the proximal segment of the previous stent, hyperlipidemia, has not been able to tolerate statins now on Repatha, hypertension since 6 weeks ago for chest pain and underwent cardiac catheterization on 09/04/2022 revealing widely patent stents  and recommended Imdur and consider Ranexa and also to evaluate for noncardiac causes of chest pain.  1. Coronary artery disease of native artery of native heart with stable angina pectoris [facility]) Patient presents for follow-up after cardiac catheterization, she has not had any complications from the procedure, she states that since being on isosorbide mononitrate she has not had any further episodes of angina.  I have refilled the prescriptions.  2. Essential hypertension Blood pressure is well controlled on acebutolol and losartan, continue the same.  3. Pure hypercholesterolemia Presently on Repatha, lipids under excellent control.  Fortunately overall she has remained stable from cardiac standpoint, I will see her back in 6 months.    Adrian Prows, MD, [facility] 10/22/2022, 11:20 AM Office: [phone] Fax: [phone] Pager: [phone]

## 2022-10-23 DIAGNOSIS — M5416 Radiculopathy, lumbar region: Secondary | ICD-10-CM | POA: Diagnosis not present

## 2022-10-23 DIAGNOSIS — M5136 Other intervertebral disc degeneration, lumbar region: Secondary | ICD-10-CM | POA: Diagnosis not present

## 2022-10-23 DIAGNOSIS — M1612 Unilateral primary osteoarthritis, left hip: Secondary | ICD-10-CM | POA: Diagnosis not present

## 2022-10-23 DIAGNOSIS — M7062 Trochanteric bursitis, left hip: Secondary | ICD-10-CM | POA: Diagnosis not present

## 2022-10-23 DIAGNOSIS — M48062 Spinal stenosis, lumbar region with neurogenic claudication: Secondary | ICD-10-CM | POA: Diagnosis not present

## 2022-10-26 ENCOUNTER — Telehealth: Payer: Self-pay

## 2022-10-26 NOTE — Patient Outreach (Signed)
  Care Coordination   10/26/2022 Name: DARNEISHA SHAPLEY MRN: [id number] DOB: Jul 22, 1956   Care Coordination Outreach Attempts:  An unsuccessful telephone outreach was attempted today to offer the patient information about available care coordination services as a benefit of their health plan.   Follow Up Plan:  Additional outreach attempts will be made to offer the patient care coordination information and services.   Encounter Outcome:  No Answer   Care Coordination Interventions:  No, not indicated    Jone Baseman, RN, MSN [place] [facility] Care Management Coordinator Direct Line [phone]

## 2022-10-29 DIAGNOSIS — I10 Essential (primary) hypertension: Secondary | ICD-10-CM | POA: Diagnosis not present

## 2022-10-29 DIAGNOSIS — M1990 Unspecified osteoarthritis, unspecified site: Secondary | ICD-10-CM | POA: Diagnosis not present

## 2022-10-29 DIAGNOSIS — K219 Gastro-esophageal reflux disease without esophagitis: Secondary | ICD-10-CM | POA: Diagnosis not present

## 2022-10-29 DIAGNOSIS — M25552 Pain in left hip: Secondary | ICD-10-CM | POA: Diagnosis not present

## 2022-10-29 DIAGNOSIS — M7062 Trochanteric bursitis, left hip: Secondary | ICD-10-CM | POA: Diagnosis not present

## 2022-10-29 DIAGNOSIS — G8193 Hemiplegia, unspecified affecting right nondominant side: Secondary | ICD-10-CM | POA: Diagnosis not present

## 2022-10-29 DIAGNOSIS — M25559 Pain in unspecified hip: Secondary | ICD-10-CM | POA: Diagnosis not present

## 2022-10-29 DIAGNOSIS — N1831 Chronic kidney disease, stage 3a: Secondary | ICD-10-CM | POA: Diagnosis not present

## 2022-10-29 DIAGNOSIS — M25551 Pain in right hip: Secondary | ICD-10-CM | POA: Diagnosis not present

## 2022-10-30 DIAGNOSIS — M1612 Unilateral primary osteoarthritis, left hip: Secondary | ICD-10-CM | POA: Diagnosis not present

## 2022-11-02 ENCOUNTER — Ambulatory Visit
Admission: RE | Admit: 2022-11-02 | Discharge: 2022-11-02 | Disposition: A | Discharge status: Home / Self Care | Payer: Medicare Other | Source: Ambulatory Visit | Attending: Orthopedic Surgery | Admitting: Orthopedic Surgery

## 2022-11-02 ENCOUNTER — Other Ambulatory Visit: Payer: Self-pay | Admitting: Orthopedic Surgery

## 2022-11-02 DIAGNOSIS — M1612 Unilateral primary osteoarthritis, left hip: Secondary | ICD-10-CM

## 2022-11-02 DIAGNOSIS — R600 Localized edema: Secondary | ICD-10-CM | POA: Diagnosis not present

## 2022-11-13 DIAGNOSIS — Z96651 Presence of right artificial knee joint: Secondary | ICD-10-CM | POA: Diagnosis not present

## 2022-11-13 DIAGNOSIS — M1711 Unilateral primary osteoarthritis, right knee: Secondary | ICD-10-CM | POA: Diagnosis not present

## 2022-11-13 DIAGNOSIS — M1612 Unilateral primary osteoarthritis, left hip: Secondary | ICD-10-CM | POA: Diagnosis not present

## 2022-11-18 ENCOUNTER — Other Ambulatory Visit: Payer: Self-pay | Admitting: Surgery

## 2022-11-19 ENCOUNTER — Inpatient Hospital Stay: Admission: RE | Admit: 2022-11-19 | Payer: Medicare Other | Source: Ambulatory Visit

## 2022-11-20 ENCOUNTER — Encounter
Admission: RE | Admit: 2022-11-20 | Discharge: 2022-11-20 | Disposition: A | Discharge status: Home / Self Care | Payer: Medicare Other | Source: Ambulatory Visit | Attending: Surgery | Admitting: Surgery

## 2022-11-20 DIAGNOSIS — Z01812 Encounter for preprocedural laboratory examination: Secondary | ICD-10-CM

## 2022-11-20 DIAGNOSIS — Z01818 Encounter for other preprocedural examination: Secondary | ICD-10-CM | POA: Insufficient documentation

## 2022-11-20 DIAGNOSIS — R9431 Abnormal electrocardiogram [ECG] [EKG]: Secondary | ICD-10-CM | POA: Diagnosis not present

## 2022-11-20 HISTORY — DX: Other specified postprocedural states: R11.2

## 2022-11-20 HISTORY — DX: Nausea with vomiting, unspecified: Z98.890

## 2022-11-20 LAB — COMPREHENSIVE METABOLIC PANEL
ALT: 21 U/L (ref 0–44)
AST: 22 U/L (ref 15–41)
Albumin: 3.7 g/dL (ref 3.5–5.0)
Alkaline Phosphatase: 98 U/L (ref 38–126)
Anion gap: 12 (ref 5–15)
BUN: 23 mg/dL (ref 8–23)
CO2: 27 mmol/L (ref 22–32)
Calcium: 9.5 mg/dL (ref 8.9–10.3)
Chloride: 99 mmol/L (ref 98–111)
Creatinine, Ser: 0.93 mg/dL (ref 0.44–1.00)
GFR, Estimated: >60 mL/min (ref >60)
Glucose, Bld: 86 mg/dL (ref 70–99)
Potassium: 4.6 mmol/L (ref 3.5–5.1)
Sodium: 138 mmol/L (ref 135–145)
Total Bilirubin: 0.6 mg/dL (ref 0.3–1.2)
Total Protein: 7.6 g/dL (ref 6.5–8.1)

## 2022-11-20 LAB — CBC WITH DIFFERENTIAL/PLATELET
Abs Immature Granulocytes: 0.02 10*3/uL (ref 0.00–0.07)
Basophils Absolute: 0.1 10*3/uL (ref 0.0–0.1)
Basophils Relative: 1 %
Eosinophils Absolute: 0.3 10*3/uL (ref 0.0–0.5)
Eosinophils Relative: 5 %
HCT: 40.2 % (ref 36.0–46.0)
Hemoglobin: 13.1 g/dL (ref 12.0–15.0)
Immature Granulocytes: 0 %
Lymphocytes Relative: 27 %
Lymphs Abs: 1.7 10*3/uL (ref 0.7–4.0)
MCH: 31.4 pg (ref 26.0–34.0)
MCHC: 32.6 g/dL (ref 30.0–36.0)
MCV: 96.4 fL (ref 80.0–100.0)
Monocytes Absolute: 0.6 10*3/uL (ref 0.1–1.0)
Monocytes Relative: 10 %
Neutro Abs: 3.7 10*3/uL (ref 1.7–7.7)
Neutrophils Relative %: 57 %
Platelets: 292 10*3/uL (ref 150–400)
RBC: 4.17 MIL/uL (ref 3.87–5.11)
RDW: 13.6 % (ref 11.5–15.5)
WBC: 6.4 10*3/uL (ref 4.0–10.5)
nRBC: 0 % (ref 0.0–0.2)

## 2022-11-20 LAB — URINALYSIS, ROUTINE W REFLEX MICROSCOPIC
Bacteria, UA: NONE SEEN
Bilirubin Urine: NEGATIVE
Glucose, UA: NEGATIVE mg/dL
Ketones, ur: 5 mg/dL — AB
Leukocytes,Ua: NEGATIVE
Nitrite: NEGATIVE
Protein, ur: NEGATIVE mg/dL
Specific Gravity, Urine: 1.035 — ABNORMAL HIGH (ref 1.005–1.030)
pH: 5 (ref 5.0–8.0)

## 2022-11-20 LAB — SURGICAL PCR SCREEN
MRSA, PCR: NEGATIVE
Staphylococcus aureus: NEGATIVE

## 2022-11-20 LAB — TYPE AND SCREEN
ABO/RH(D): O POS
Antibody Screen: NEGATIVE

## 2022-11-20 NOTE — Patient Instructions (Addendum)
Your procedure is scheduled on: Tuesday, March 19 Report to the Registration Desk on the 1st floor of the [facility]. To find out your arrival time, please call [phone] between 1PM - 3PM on: Monday, March 18 If your arrival time is 6:00 am, do not arrive before that time as the [facility] entrance doors do not open until 6:00 am.  REMEMBER: Instructions that are not followed completely may result in serious medical risk, up to and including death; or upon the discretion of your surgeon and anesthesiologist your surgery may need to be rescheduled.  Do not eat food after midnight the night before surgery.  No gum chewing or hard candies.  You may however, drink CLEAR liquids up to 2 hours before you are scheduled to arrive for your surgery. Do not drink anything within 2 hours of your scheduled arrival time.  Clear liquids include: - water  - Salmons juice without pulp - gatorade (not RED colors) - black coffee or tea (Do NOT add milk or creamers to the coffee or tea) Do NOT drink anything that is not on this list.  In addition, your doctor has ordered for you to drink the provided:  Ensure Pre-Surgery Clear Carbohydrate Drink  Drinking this carbohydrate drink up to two hours before surgery helps to reduce insulin resistance and improve patient outcomes. Please complete drinking 2 hours before scheduled arrival time.  One week prior to surgery: starting today, March 15 Stop Anti-inflammatories (NSAIDS) such as Advil, Aleve, Ibuprofen, Motrin, Naproxen, Naprosyn and Aspirin based products such as Excedrin, Goody's Powder, BC Powder. Stop ANY OVER THE COUNTER supplements until after surgery. You may however, continue to take Tylenol if needed for pain up until the day of surgery.  You may continue taking the 81 mg aspirin as usual.  Do not take repatha again until after surgery.  Continue taking all prescribed medications   TAKE ONLY THESE MEDICATIONS THE MORNING OF SURGERY  WITH A SIP OF WATER:  Acebutolol (Sectral) Divalproex (Depakote) Isosorbide mononitrate Pantoprazole (Protonix) - (take one the night before and one on the morning of surgery - helps to prevent nausea after surgery.) Symbicort inhaler  No Alcohol for 24 hours before or after surgery.  No Smoking including e-cigarettes for 24 hours before surgery.  No chewable tobacco products for at least 6 hours before surgery.  No nicotine patches on the day of surgery.  Do not use any "recreational" drugs for at least a week (preferably 2 weeks) before your surgery.  Please be advised that the combination of cocaine and anesthesia may have negative outcomes, up to and including death. If you test positive for cocaine, your surgery will be cancelled.  On the morning of surgery brush your teeth with toothpaste and water, you may rinse your mouth with mouthwash if you wish. Do not swallow any toothpaste or mouthwash.  Use CHG Soap as directed on instruction sheet.  Do not wear jewelry, make-up, hairpins, clips or nail polish.  Do not wear lotions, powders, or perfumes.   Do not shave body hair from the neck down 48 hours before surgery.  Contact lenses, hearing aids and dentures may not be worn into surgery.  Do not bring valuables to the hospital. [facility] is not responsible for any missing/lost belongings or valuables.   Notify your doctor if there is any change in your medical condition (cold, fever, infection).  Wear comfortable clothing (specific to your surgery type) to the hospital.  After surgery, you can  help prevent lung complications by doing breathing exercises.  Take deep breaths and cough every 1-2 hours. Your doctor may order a device called an Incentive Spirometer to help you take deep breaths.  If you are being admitted to the hospital overnight, leave your suitcase in the car. After surgery it may be brought to your room.  In case of increased patient census, it may be  necessary for you, the patient, to continue your postoperative care in the Same Day Surgery department.  If you are being discharged the day of surgery, you will not be allowed to drive home. You will need a responsible individual to drive you home and stay with you for 24 hours after surgery.   If you are taking public transportation, you will need to have a responsible individual with you.  Please call the [facility] Dept. at [phone] if you have any questions about these instructions.  Surgery Visitation Policy:  Patients undergoing a surgery or procedure may have two family members or support persons with them as long as the person is not COVID-19 positive or experiencing its symptoms.   Inpatient Visitation:    Visiting hours are 7 a.m. to 8 p.m. Up to four visitors are allowed at one time in a patient room. The visitors may rotate out with other people during the day. One designated support person (adult) may remain overnight.  Due to an increase in RSV and influenza rates and associated hospitalizations, children ages 17 and under will not be able to visit patients in [facility]. Masks continue to be strongly recommended.     Preparing for Surgery with CHLORHEXIDINE GLUCONATE (CHG) Soap  Chlorhexidine Gluconate (CHG) Soap  o An antiseptic cleaner that kills germs and bonds with the skin to continue killing germs even after washing  o Used for showering the night before surgery and morning of surgery  Before surgery, you can play an important role by reducing the number of germs on your skin.  CHG (Chlorhexidine gluconate) soap is an antiseptic cleanser which kills germs and bonds with the skin to continue killing germs even after washing.  Please do not use if you have an allergy to CHG or antibacterial soaps. If your skin becomes reddened/irritated stop using the CHG.  1. Shower the NIGHT BEFORE SURGERY and the MORNING OF SURGERY with CHG  soap.  2. If you choose to wash your hair, wash your hair first as usual with your normal shampoo.  3. After shampooing, rinse your hair and body thoroughly to remove the shampoo.  4. Use CHG as you would any other liquid soap. You can apply CHG directly to the skin and wash gently with a scrungie or a clean washcloth.  5. Apply the CHG soap to your body only from the neck down. Do not use on open wounds or open sores. Avoid contact with your eyes, ears, mouth, and genitals (private parts). Wash face and genitals (private parts) with your normal soap.  6. Wash thoroughly, paying special attention to the area where your surgery will be performed.  7. Thoroughly rinse your body with warm water.  8. Do not shower/wash with your normal soap after using and rinsing off the CHG soap.  9. Pat yourself dry with a clean towel.  10. Wear clean pajamas to bed the night before surgery.  12. Place clean sheets on your bed the night of your first shower and do not sleep with pets.  13. Shower again with the  CHG soap on the day of surgery prior to arriving at the hospital.  14. Do not apply any deodorants/lotions/powders.  15. Please wear clean clothes to the hospital.  Preoperative Educational Videos for Total Hip, Knee and Shoulder Replacements  To better prepare for surgery, please view our videos that explain the physical activity and discharge planning required to have the best surgical recovery at [facility].  [email or web address]  Questions? Call [phone] or email [email or web address]

## 2022-11-23 ENCOUNTER — Encounter: Payer: Self-pay | Admitting: Cardiology

## 2022-11-24 ENCOUNTER — Ambulatory Visit
Admission: RE | Admit: 2022-11-24 | Discharge: 2022-11-25 | Disposition: A | Discharge status: Home / Self Care | Payer: Medicare Other | Attending: Surgery | Admitting: Surgery

## 2022-11-24 ENCOUNTER — Other Ambulatory Visit: Payer: Self-pay

## 2022-11-24 ENCOUNTER — Ambulatory Visit: Payer: Medicare Other

## 2022-11-24 ENCOUNTER — Encounter
Admission: RE | Disposition: A | Discharge status: Home / Self Care | Payer: Self-pay | Source: Home / Self Care | Attending: Surgery

## 2022-11-24 ENCOUNTER — Encounter: Payer: Self-pay | Admitting: Surgery

## 2022-11-24 ENCOUNTER — Ambulatory Visit: Payer: Medicare Other | Admitting: Urgent Care

## 2022-11-24 DIAGNOSIS — Z955 Presence of coronary angioplasty implant and graft: Secondary | ICD-10-CM | POA: Insufficient documentation

## 2022-11-24 DIAGNOSIS — I2510 Atherosclerotic heart disease of native coronary artery without angina pectoris: Secondary | ICD-10-CM | POA: Diagnosis not present

## 2022-11-24 DIAGNOSIS — R519 Headache, unspecified: Secondary | ICD-10-CM | POA: Diagnosis not present

## 2022-11-24 DIAGNOSIS — I7300 Raynaud's syndrome without gangrene: Secondary | ICD-10-CM | POA: Diagnosis not present

## 2022-11-24 DIAGNOSIS — Z01812 Encounter for preprocedural laboratory examination: Secondary | ICD-10-CM

## 2022-11-24 DIAGNOSIS — I129 Hypertensive chronic kidney disease with stage 1 through stage 4 chronic kidney disease, or unspecified chronic kidney disease: Secondary | ICD-10-CM | POA: Insufficient documentation

## 2022-11-24 DIAGNOSIS — N189 Chronic kidney disease, unspecified: Secondary | ICD-10-CM | POA: Insufficient documentation

## 2022-11-24 DIAGNOSIS — M1612 Unilateral primary osteoarthritis, left hip: Secondary | ICD-10-CM | POA: Diagnosis not present

## 2022-11-24 DIAGNOSIS — E7800 Pure hypercholesterolemia, unspecified: Secondary | ICD-10-CM | POA: Insufficient documentation

## 2022-11-24 DIAGNOSIS — F419 Anxiety disorder, unspecified: Secondary | ICD-10-CM | POA: Diagnosis not present

## 2022-11-24 DIAGNOSIS — I25119 Atherosclerotic heart disease of native coronary artery with unspecified angina pectoris: Secondary | ICD-10-CM | POA: Insufficient documentation

## 2022-11-24 DIAGNOSIS — K449 Diaphragmatic hernia without obstruction or gangrene: Secondary | ICD-10-CM | POA: Insufficient documentation

## 2022-11-24 DIAGNOSIS — Z7902 Long term (current) use of antithrombotics/antiplatelets: Secondary | ICD-10-CM | POA: Diagnosis not present

## 2022-11-24 DIAGNOSIS — F32A Depression, unspecified: Secondary | ICD-10-CM | POA: Insufficient documentation

## 2022-11-24 DIAGNOSIS — Z7901 Long term (current) use of anticoagulants: Secondary | ICD-10-CM | POA: Diagnosis not present

## 2022-11-24 DIAGNOSIS — Z96642 Presence of left artificial hip joint: Secondary | ICD-10-CM | POA: Diagnosis not present

## 2022-11-24 DIAGNOSIS — K219 Gastro-esophageal reflux disease without esophagitis: Secondary | ICD-10-CM | POA: Insufficient documentation

## 2022-11-24 DIAGNOSIS — K5730 Diverticulosis of large intestine without perforation or abscess without bleeding: Secondary | ICD-10-CM | POA: Insufficient documentation

## 2022-11-24 DIAGNOSIS — J441 Chronic obstructive pulmonary disease with (acute) exacerbation: Secondary | ICD-10-CM | POA: Insufficient documentation

## 2022-11-24 DIAGNOSIS — I10 Essential (primary) hypertension: Secondary | ICD-10-CM | POA: Diagnosis not present

## 2022-11-24 DIAGNOSIS — J4551 Severe persistent asthma with (acute) exacerbation: Secondary | ICD-10-CM | POA: Insufficient documentation

## 2022-11-24 DIAGNOSIS — Z471 Aftercare following joint replacement surgery: Secondary | ICD-10-CM | POA: Diagnosis not present

## 2022-11-24 DIAGNOSIS — Z7982 Long term (current) use of aspirin: Secondary | ICD-10-CM | POA: Insufficient documentation

## 2022-11-24 DIAGNOSIS — G43909 Migraine, unspecified, not intractable, without status migrainosus: Secondary | ICD-10-CM | POA: Diagnosis not present

## 2022-11-24 DIAGNOSIS — M87059 Idiopathic aseptic necrosis of unspecified femur: Secondary | ICD-10-CM | POA: Diagnosis not present

## 2022-11-24 HISTORY — PX: TOTAL HIP ARTHROPLASTY: SHX124

## 2022-11-24 SURGERY — ARTHROPLASTY, HIP, TOTAL,POSTERIOR APPROACH
Anesthesia: Spinal | Site: Hip | Laterality: Left

## 2022-11-24 MED ORDER — SODIUM CHLORIDE FLUSH 0.9 % IV SOLN
INTRAVENOUS | Status: AC
  Filled 2022-11-24: qty 40

## 2022-11-24 MED ORDER — ONDANSETRON HCL 4 MG PO TABS: 4.0000 mg | ORAL_TABLET | Freq: Four times a day (QID) | ORAL | Status: DC | PRN

## 2022-11-24 MED ORDER — ORAL CARE MOUTH RINSE: 15.0000 mL | Freq: Once | OROMUCOSAL | Status: AC

## 2022-11-24 MED ORDER — PROPOFOL 10 MG/ML IV BOLUS
INTRAVENOUS | Status: AC
  Filled 2022-11-24: qty 20

## 2022-11-24 MED ORDER — MIDAZOLAM HCL 2 MG/2ML IJ SOLN
INTRAMUSCULAR | Status: AC
  Filled 2022-11-24: qty 2

## 2022-11-24 MED ORDER — LOSARTAN POTASSIUM 25 MG PO TABS
25.0000 mg | ORAL_TABLET | Freq: Every evening | ORAL | Status: DC
  Administered 2022-11-24: 25 mg via ORAL
  Filled 2022-11-24: qty 1

## 2022-11-24 MED ORDER — ACETAMINOPHEN 10 MG/ML IV SOLN
INTRAVENOUS | Status: DC | PRN
  Administered 2022-11-24: 1000 mg via INTRAVENOUS

## 2022-11-24 MED ORDER — ACETAMINOPHEN 10 MG/ML IV SOLN
INTRAVENOUS | Status: AC
  Filled 2022-11-24: qty 100

## 2022-11-24 MED ORDER — PROPOFOL 1000 MG/100ML IV EMUL
INTRAVENOUS | Status: AC
  Filled 2022-11-24: qty 100

## 2022-11-24 MED ORDER — TRAMADOL HCL 50 MG PO TABS
50.0000 mg | ORAL_TABLET | Freq: Four times a day (QID) | ORAL | Status: DC | PRN
  Administered 2022-11-24: 50 mg via ORAL
  Filled 2022-11-24: qty 1

## 2022-11-24 MED ORDER — PROPOFOL 500 MG/50ML IV EMUL
INTRAVENOUS | Status: DC | PRN
  Administered 2022-11-24: 50 ug/kg/min via INTRAVENOUS
  Administered 2022-11-24: 20 mg via INTRAVENOUS
  Administered 2022-11-24: 10 mg via INTRAVENOUS
  Administered 2022-11-24: 50 ug/kg/min via INTRAVENOUS

## 2022-11-24 MED ORDER — LORATADINE 10 MG PO TABS
10.0000 mg | ORAL_TABLET | Freq: Every day | ORAL | Status: DC
  Administered 2022-11-24: 10 mg via ORAL
  Filled 2022-11-24: qty 1

## 2022-11-24 MED ORDER — ACETAMINOPHEN 500 MG PO TABS
1000.0000 mg | ORAL_TABLET | Freq: Four times a day (QID) | ORAL | Status: AC
  Administered 2022-11-24 – 2022-11-25 (×4): 1000 mg via ORAL
  Filled 2022-11-24 (×4): qty 2

## 2022-11-24 MED ORDER — SODIUM CHLORIDE 0.9 % IR SOLN
Status: DC | PRN
  Administered 2022-11-24: 3000 mL

## 2022-11-24 MED ORDER — APIXABAN 2.5 MG PO TABS
2.5000 mg | ORAL_TABLET | Freq: Two times a day (BID) | ORAL | Status: DC
  Administered 2022-11-25: 2.5 mg via ORAL
  Filled 2022-11-24: qty 1

## 2022-11-24 MED ORDER — VANCOMYCIN HCL IN DEXTROSE 1-5 GM/200ML-% IV SOLN: 1000.0000 mg | INTRAVENOUS | Status: AC

## 2022-11-24 MED ORDER — TRANEXAMIC ACID 1000 MG/10ML IV SOLN
INTRAVENOUS | Status: AC
  Filled 2022-11-24: qty 10

## 2022-11-24 MED ORDER — OXYCODONE HCL 5 MG PO TABS
ORAL_TABLET | ORAL | Status: AC
  Administered 2022-11-24: 5 mg via ORAL
  Filled 2022-11-24: qty 1

## 2022-11-24 MED ORDER — CHLORHEXIDINE GLUCONATE 0.12 % MT SOLN
OROMUCOSAL | Status: AC
  Administered 2022-11-24: 15 mL via OROMUCOSAL
  Filled 2022-11-24: qty 15

## 2022-11-24 MED ORDER — FLEET ENEMA 7-19 GM/118ML RE ENEM: 1.0000 | ENEMA | Freq: Once | RECTAL | Status: DC | PRN

## 2022-11-24 MED ORDER — ISOSORBIDE MONONITRATE ER 30 MG PO TB24
30.0000 mg | ORAL_TABLET | Freq: Every day | ORAL | Status: DC
  Administered 2022-11-25: 30 mg via ORAL
  Filled 2022-11-24: qty 1

## 2022-11-24 MED ORDER — DIPHENHYDRAMINE HCL 12.5 MG/5ML PO ELIX: 12.5000 mg | ORAL_SOLUTION | ORAL | Status: DC | PRN

## 2022-11-24 MED ORDER — EPINEPHRINE PF 1 MG/ML IJ SOLN
INTRAMUSCULAR | Status: AC
  Filled 2022-11-24: qty 1

## 2022-11-24 MED ORDER — NITROGLYCERIN 0.4 MG SL SUBL: 0.4000 mg | SUBLINGUAL_TABLET | SUBLINGUAL | Status: DC | PRN

## 2022-11-24 MED ORDER — BUPIVACAINE HCL (PF) 0.5 % IJ SOLN
INTRAMUSCULAR | Status: AC
  Filled 2022-11-24: qty 30

## 2022-11-24 MED ORDER — HYDROXYZINE HCL 25 MG PO TABS: 25.0000 mg | ORAL_TABLET | Freq: Four times a day (QID) | ORAL | Status: DC | PRN

## 2022-11-24 MED ORDER — BISACODYL 10 MG RE SUPP: 10.0000 mg | Freq: Every day | RECTAL | Status: DC | PRN

## 2022-11-24 MED ORDER — TRANEXAMIC ACID 1000 MG/10ML IV SOLN
INTRAVENOUS | Status: DC | PRN
  Administered 2022-11-24: 1000 mg via TOPICAL

## 2022-11-24 MED ORDER — TRIAMCINOLONE ACETONIDE 40 MG/ML IJ SUSP
INTRAMUSCULAR | Status: AC
  Filled 2022-11-24: qty 2

## 2022-11-24 MED ORDER — ONDANSETRON HCL 4 MG/2ML IJ SOLN
4.0000 mg | Freq: Once | INTRAMUSCULAR | Status: AC | PRN
  Administered 2022-11-24: 4 mg via INTRAVENOUS

## 2022-11-24 MED ORDER — EPHEDRINE SULFATE (PRESSORS) 50 MG/ML IJ SOLN
INTRAMUSCULAR | Status: DC | PRN
  Administered 2022-11-24: 2.5 mg via INTRAVENOUS
  Administered 2022-11-24 (×2): 5 mg via INTRAVENOUS

## 2022-11-24 MED ORDER — OXYCODONE HCL 5 MG/5ML PO SOLN: 5.0000 mg | Freq: Once | ORAL | Status: AC | PRN

## 2022-11-24 MED ORDER — VANCOMYCIN HCL IN DEXTROSE 1-5 GM/200ML-% IV SOLN
INTRAVENOUS | Status: AC
  Administered 2022-11-24: 1000 mg via INTRAVENOUS
  Filled 2022-11-24: qty 200

## 2022-11-24 MED ORDER — PROPOFOL 10 MG/ML IV BOLUS
INTRAVENOUS | Status: DC | PRN
  Administered 2022-11-24 (×3): 10 mg via INTRAVENOUS

## 2022-11-24 MED ORDER — ONDANSETRON HCL 4 MG/2ML IJ SOLN
INTRAMUSCULAR | Status: AC
  Filled 2022-11-24: qty 2

## 2022-11-24 MED ORDER — BUPIVACAINE-EPINEPHRINE (PF) 0.5% -1:200000 IJ SOLN
INTRAMUSCULAR | Status: DC | PRN
  Administered 2022-11-24: 30 mL

## 2022-11-24 MED ORDER — SODIUM CHLORIDE 0.9 % IV SOLN: INTRAVENOUS | Status: DC

## 2022-11-24 MED ORDER — KETOROLAC TROMETHAMINE 30 MG/ML IJ SOLN
INTRAMUSCULAR | Status: DC | PRN
  Administered 2022-11-24: 30 mg via INTRAVENOUS

## 2022-11-24 MED ORDER — ONDANSETRON HCL 4 MG/2ML IJ SOLN
INTRAMUSCULAR | Status: DC | PRN
  Administered 2022-11-24: 4 mg via INTRAVENOUS

## 2022-11-24 MED ORDER — ACEBUTOLOL HCL 200 MG PO CAPS: 200.0000 mg | ORAL_CAPSULE | Freq: Two times a day (BID) | ORAL | Status: DC

## 2022-11-24 MED ORDER — BUPIVACAINE LIPOSOME 1.3 % IJ SUSP
INTRAMUSCULAR | Status: AC
  Filled 2022-11-24: qty 20

## 2022-11-24 MED ORDER — SODIUM CHLORIDE 0.9 % IV SOLN
INTRAVENOUS | Status: DC | PRN
  Administered 2022-11-24: 60 mL

## 2022-11-24 MED ORDER — METOCLOPRAMIDE HCL 5 MG PO TABS: 5.0000 mg | ORAL_TABLET | Freq: Three times a day (TID) | ORAL | Status: DC | PRN

## 2022-11-24 MED ORDER — OXYCODONE HCL 5 MG PO TABS
5.0000 mg | ORAL_TABLET | ORAL | Status: DC | PRN
  Administered 2022-11-24 – 2022-11-25 (×2): 5 mg via ORAL
  Administered 2022-11-25 (×3): 10 mg via ORAL
  Filled 2022-11-24 (×2): qty 2
  Filled 2022-11-24: qty 1
  Filled 2022-11-24: qty 2
  Filled 2022-11-24: qty 1

## 2022-11-24 MED ORDER — ACETAMINOPHEN 325 MG PO TABS: 325.0000 mg | ORAL_TABLET | Freq: Four times a day (QID) | ORAL | Status: DC | PRN

## 2022-11-24 MED ORDER — METOPROLOL TARTRATE 50 MG PO TABS: 50.0000 mg | ORAL_TABLET | Freq: Two times a day (BID) | ORAL | Status: DC

## 2022-11-24 MED ORDER — CYCLOBENZAPRINE HCL 10 MG PO TABS
10.0000 mg | ORAL_TABLET | Freq: Every day | ORAL | Status: DC
  Administered 2022-11-24: 10 mg via ORAL
  Filled 2022-11-24: qty 1

## 2022-11-24 MED ORDER — LACTATED RINGERS IV SOLN: INTRAVENOUS | Status: DC

## 2022-11-24 MED ORDER — MIDAZOLAM HCL 5 MG/5ML IJ SOLN
INTRAMUSCULAR | Status: DC | PRN
  Administered 2022-11-24: 2 mg via INTRAVENOUS

## 2022-11-24 MED ORDER — TRAZODONE HCL 100 MG PO TABS
100.0000 mg | ORAL_TABLET | Freq: Every day | ORAL | Status: DC
  Administered 2022-11-24: 100 mg via ORAL
  Filled 2022-11-24: qty 1

## 2022-11-24 MED ORDER — MOMETASONE FURO-FORMOTEROL FUM 100-5 MCG/ACT IN AERO
2.0000 | INHALATION_SPRAY | Freq: Two times a day (BID) | RESPIRATORY_TRACT | Status: DC
  Administered 2022-11-25: 2 via RESPIRATORY_TRACT
  Filled 2022-11-24: qty 8.8

## 2022-11-24 MED ORDER — DOCUSATE SODIUM 100 MG PO CAPS
100.0000 mg | ORAL_CAPSULE | Freq: Two times a day (BID) | ORAL | Status: DC
  Administered 2022-11-24 – 2022-11-25 (×2): 100 mg via ORAL
  Filled 2022-11-24 (×2): qty 1

## 2022-11-24 MED ORDER — FENTANYL CITRATE (PF) 100 MCG/2ML IJ SOLN: 25.0000 ug | INTRAMUSCULAR | Status: DC | PRN

## 2022-11-24 MED ORDER — LACTATED RINGERS IV SOLN: INTRAVENOUS | Status: DC | PRN

## 2022-11-24 MED ORDER — ACETAMINOPHEN 10 MG/ML IV SOLN: 1000.0000 mg | Freq: Once | INTRAVENOUS | Status: DC | PRN

## 2022-11-24 MED ORDER — ONDANSETRON HCL 4 MG/2ML IJ SOLN: 4.0000 mg | Freq: Four times a day (QID) | INTRAMUSCULAR | Status: DC | PRN

## 2022-11-24 MED ORDER — BUPIVACAINE HCL (PF) 0.5 % IJ SOLN
INTRAMUSCULAR | Status: DC | PRN
  Administered 2022-11-24: 2.5 mL

## 2022-11-24 MED ORDER — DIVALPROEX SODIUM ER 250 MG PO TB24
250.0000 mg | ORAL_TABLET | Freq: Every morning | ORAL | Status: DC
  Administered 2022-11-25: 250 mg via ORAL
  Filled 2022-11-24: qty 1

## 2022-11-24 MED ORDER — PHENYLEPHRINE HCL-NACL 20-0.9 MG/250ML-% IV SOLN
INTRAVENOUS | Status: DC | PRN
  Administered 2022-11-24: 80 ug via INTRAVENOUS
  Administered 2022-11-24: 40 ug/min via INTRAVENOUS
  Administered 2022-11-24: 40 ug via INTRAVENOUS
  Administered 2022-11-24: 160 ug via INTRAVENOUS
  Administered 2022-11-24 (×2): 80 ug via INTRAVENOUS
  Administered 2022-11-24: 40 ug via INTRAVENOUS
  Administered 2022-11-24 (×3): 80 ug via INTRAVENOUS

## 2022-11-24 MED ORDER — VANCOMYCIN HCL IN DEXTROSE 1-5 GM/200ML-% IV SOLN: 1000.0000 mg | Freq: Two times a day (BID) | INTRAVENOUS | Status: DC

## 2022-11-24 MED ORDER — TRIAMCINOLONE ACETONIDE 40 MG/ML IJ SUSP
INTRAMUSCULAR | Status: DC | PRN
  Administered 2022-11-24: 80 mg via INTRAMUSCULAR

## 2022-11-24 MED ORDER — ACEBUTOLOL HCL 200 MG PO CAPS
200.0000 mg | ORAL_CAPSULE | Freq: Two times a day (BID) | ORAL | Status: DC
  Administered 2022-11-24: 200 mg via ORAL
  Filled 2022-11-24 (×2): qty 1

## 2022-11-24 MED ORDER — OXYCODONE HCL 5 MG PO TABS
ORAL_TABLET | ORAL | Status: AC
  Filled 2022-11-24: qty 1

## 2022-11-24 MED ORDER — MAGNESIUM HYDROXIDE 400 MG/5ML PO SUSP
30.0000 mL | Freq: Every day | ORAL | Status: DC | PRN
  Administered 2022-11-25: 30 mL via ORAL
  Filled 2022-11-24: qty 30

## 2022-11-24 MED ORDER — PANTOPRAZOLE SODIUM 20 MG PO TBEC
20.0000 mg | DELAYED_RELEASE_TABLET | Freq: Every day | ORAL | Status: DC
  Administered 2022-11-25: 20 mg via ORAL
  Filled 2022-11-24: qty 1

## 2022-11-24 MED ORDER — OXYCODONE HCL 5 MG PO TABS
5.0000 mg | ORAL_TABLET | Freq: Once | ORAL | Status: AC | PRN
  Administered 2022-11-24: 5 mg via ORAL

## 2022-11-24 MED ORDER — METOCLOPRAMIDE HCL 5 MG/ML IJ SOLN: 5.0000 mg | Freq: Three times a day (TID) | INTRAMUSCULAR | Status: DC | PRN

## 2022-11-24 MED ORDER — DEXAMETHASONE SODIUM PHOSPHATE 10 MG/ML IJ SOLN
INTRAMUSCULAR | Status: DC | PRN
  Administered 2022-11-24: 10 mg via INTRAVENOUS

## 2022-11-24 MED ORDER — CHLORHEXIDINE GLUCONATE 0.12 % MT SOLN: 15.0000 mL | Freq: Once | OROMUCOSAL | Status: AC

## 2022-11-24 MED ORDER — VANCOMYCIN HCL 1000 MG IV SOLR
INTRAVENOUS | Status: DC | PRN
  Administered 2022-11-24: 1000 mg via INTRAVENOUS

## 2022-11-24 SURGICAL SUPPLY — 59 items
APL PRP STRL LF DISP 70% ISPRP (MISCELLANEOUS) ×2
BLADE SAGITTAL WIDE XTHICK NO (BLADE) ×1 IMPLANT
BLADE SURG SZ20 CARB STEEL (BLADE) ×1 IMPLANT
CHLORAPREP W/TINT 26 (MISCELLANEOUS) ×1 IMPLANT
DRAPE 3/4 80X56 (DRAPES) ×1 IMPLANT
DRAPE IMP U-DRAPE 54X76 (DRAPES) IMPLANT
DRAPE INCISE IOBAN 66X60 STRL (DRAPES) ×1 IMPLANT
DRAPE SURG 17X11 SM STRL (DRAPES) ×2 IMPLANT
DRSG MEPILEX SACRM 8.7X9.8 (GAUZE/BANDAGES/DRESSINGS) ×1 IMPLANT
DRSG OPSITE POSTOP 3X4 (GAUZE/BANDAGES/DRESSINGS) IMPLANT
DRSG OPSITE POSTOP 4X10 (GAUZE/BANDAGES/DRESSINGS) ×1 IMPLANT
DRSG OPSITE POSTOP 4X12 (GAUZE/BANDAGES/DRESSINGS) IMPLANT
ELECT CAUTERY BLADE 6.4 (BLADE) ×1 IMPLANT
GAUZE 4X4 16PLY ~~LOC~~+RFID DBL (SPONGE) ×1 IMPLANT
GAUZE XEROFORM 1X8 LF (GAUZE/BANDAGES/DRESSINGS) ×1 IMPLANT
GLOVE BIO SURGEON STRL SZ7.5 (GLOVE) ×4 IMPLANT
GLOVE BIO SURGEON STRL SZ8 (GLOVE) ×4 IMPLANT
GLOVE BIOGEL PI IND STRL 8 (GLOVE) ×1 IMPLANT
GLOVE INDICATOR 8.0 STRL GRN (GLOVE) ×1 IMPLANT
GOWN STRL REUS W/ TWL LRG LVL3 (GOWN DISPOSABLE) ×1 IMPLANT
GOWN STRL REUS W/ TWL XL LVL3 (GOWN DISPOSABLE) ×1 IMPLANT
GOWN STRL REUS W/TWL LRG LVL3 (GOWN DISPOSABLE) ×1
GOWN STRL REUS W/TWL XL LVL3 (GOWN DISPOSABLE) ×1
HANDLE YANKAUER SUCT OPEN TIP (MISCELLANEOUS) ×1 IMPLANT
HEAD CERAMIC BIOLOX 36MM (Head) IMPLANT
HIP SHELL ACETAB 3H 50MM (Hips) ×1 IMPLANT
HIP SLEEVE BIOLOX -6MM OFFSET (Sleeve) ×1 IMPLANT
HOLSTER ELECTROSUGICAL PENCIL (MISCELLANEOUS) ×1 IMPLANT
HOOD PEEL AWAY T7 (MISCELLANEOUS) ×3 IMPLANT
IV NS IRRIG 3000ML ARTHROMATIC (IV SOLUTION) ×1 IMPLANT
KIT TURNOVER KIT A (KITS) ×1 IMPLANT
LINER ACE G7 36 SZ D HIGH WALL (Liner) IMPLANT
MANIFOLD NEPTUNE II (INSTRUMENTS) ×1 IMPLANT
NDL FILTER BLUNT 18X1 1/2 (NEEDLE) ×1 IMPLANT
NDL SAFETY ECLIP 18X1.5 (MISCELLANEOUS) ×2 IMPLANT
NDL SPNL 20GX3.5 QUINCKE YW (NEEDLE) ×1 IMPLANT
NEEDLE FILTER BLUNT 18X1 1/2 (NEEDLE) ×1 IMPLANT
NEEDLE SPNL 20GX3.5 QUINCKE YW (NEEDLE) ×1 IMPLANT
PACK HIP PROSTHESIS (MISCELLANEOUS) ×1 IMPLANT
PENCIL SMOKE EVACUATOR (MISCELLANEOUS) ×1 IMPLANT
PIN STEINMAN 3/16 (PIN) ×1 IMPLANT
PULSAVAC PLUS IRRIG FAN TIP (DISPOSABLE) ×1
SHELL ACETAB HIP 3H 50MM (Hips) IMPLANT
SLEEVE HIP BIOLOX -6MM OFFSET (Sleeve) IMPLANT
SPONGE T-LAP 18X18 ~~LOC~~+RFID (SPONGE) ×5 IMPLANT
STAPLER SKIN PROX 35W (STAPLE) ×1 IMPLANT
STEM FEMORAL FULL 15X155 ECHO (Stem) IMPLANT
SUT TICRON 2-0 30IN 311381 (SUTURE) ×3 IMPLANT
SUT VIC AB 0 CT1 36 (SUTURE) ×1 IMPLANT
SUT VIC AB 1 CT1 36 (SUTURE) ×1 IMPLANT
SUT VIC AB 2-0 CT1 (SUTURE) ×3 IMPLANT
SYR 10ML LL (SYRINGE) ×1 IMPLANT
SYR 20ML LL LF (SYRINGE) ×1 IMPLANT
SYR 30ML LL (SYRINGE) ×2 IMPLANT
TAPE TRANSPORE STRL 2 31045 (GAUZE/BANDAGES/DRESSINGS) ×1 IMPLANT
TIP FAN IRRIG PULSAVAC PLUS (DISPOSABLE) ×1 IMPLANT
TRAP FLUID SMOKE EVACUATOR (MISCELLANEOUS) ×2 IMPLANT
WATER STERILE IRR 1000ML POUR (IV SOLUTION) ×1 IMPLANT
WATER STERILE IRR 500ML POUR (IV SOLUTION) ×1 IMPLANT

## 2022-11-24 NOTE — H&P (Signed)
History of Present Illness:  Tonya Boyd is a 67 y.o. female who presents for evaluation and treatment of her left hip/groin pain. She notes that the symptoms began about 2 months ago and developed without any specific cause or injury. Initially, she saw Allene Dillon, NP, who felt that much of her pain was coming from her left hip, so she arranged for a left hip injection to be done by Dr. Altamease Oiler is under fluoroscopic guidance. This was done on 10/06/2022. The patient notes that this provided substantial relief of her symptoms but only lasted 2 weeks or so before her symptoms began to recur. She notes significant pain in her hip on today's visit which she rates at 10/10. Her symptoms are localized to the groin as well as to the anterolateral hip region. The symptoms are aggravated with any standing or ambulation, as well as well as with any twisting motions. She is ambulating with a cane or walker for short distances and uses a scooter to get around for any longer distances. She is unable to reciprocate stairs. She has pain at night. She saw Rachelle Hora, PA-C, on 10/30/2022 who ordered an MRI scan of the left hip and referred her to me for further evaluation and treatment.  Current Outpatient Medications: acebutoloL (SECTRAL) 200 MG capsule Take 200 mg by mouth 2 (two) times daily  acetaminophen (TYLENOL) 500 MG tablet Take 1,000 mg by mouth as needed for Pain  clopidogreL (PLAVIX) 75 mg tablet Take by mouth Take 1 tablet (75 mg total) by mouth daily.  cyanocobalamin (VITAMIN B12) 1,000 mcg/mL injection Inject into the muscle monthly  divalproex (DEPAKOTE ER) 250 MG ER tablet Take 250 mg by mouth once daily Take 1 tablet (250 mg total) by mouth daily.  ergocalciferol, vitamin D2, 1,250 mcg (50,000 unit) capsule Take 50,000 Units by mouth every Monday.  evolocumab (REPATHA SURECLICK) [id number] mg/mL PnIj INJECT 140 MG INTO THE SKIN EVERY 2 WEEKS.  ezetimibe (ZETIA) 10 mg tablet Take 10 mg by mouth once  daily  fluticasone propionate (FLONASE) 50 mcg/actuation nasal spray 1 spray in each nostril  HYDROcodone-acetaminophen (NORCO) 5-325 mg tablet Take 1 tablet by mouth every 8 (eight) hours as needed for Pain 21 tablet 0  hydrOXYzine (ATARAX) 25 MG tablet Take 1 tablet by mouth once daily as needed  isosorbide dinitrate (ISORDIL) 10 MG tablet Take 10 mg by mouth once daily  loratadine (CLARITIN) 10 mg tablet Take by mouth Take 10 mg by mouth at bedtime.  losartan (COZAAR) 25 MG tablet Take 25 mg by mouth once daily  nitroGLYcerin (NITROSTAT) 0.4 MG SL tablet Place under the tongue Place 1 tablet (0.4 mg total) under the tongue every 5 (five) minutes as needed for chest pain.  pantoprazole (PROTONIX) 20 MG DR tablet TAKE 1 TABLET BY MOUTH ONCE A DAY  predniSONE (DELTASONE) 5 MG tablet Take as directed - 6 day taper 21 tablet 0  SALONPAS, CAPSAICIN-MENTHOL, TOPICAL Apply 1 patch topically once daily  tiZANidine (ZANAFLEX) 4 MG tablet Take 1 tablet (4 mg total) by mouth 3 (three) times daily as needed 30 tablet 0  traZODone (DESYREL) 50 MG tablet 1-2 tablet at bedtime as needed   Allergies  Ticagrelor Shortness Of Breath  Acetaminophen Itching  Amlodipine Besylate Other (Numbness and tingling)  Carvedilol Other (GI Upset)  Cephalexin Other (confusion)  Hydrochlorothiazide Other (Urinary frequency)  Ibuprofen Unknown  Metoclopramide Other (confusion, "Made me feel goofy") Montelukast Diarrhea  Morphine Other (hyper)  Naproxen Other (Infection in  GI tract)  Nexlizet [Bempedoic Acid-Ezetimibe] Unknown  NSAIDs Other (Esophagus became red and swollen)  Oxycodone Itching and Other (Welts)  Oxycodone-Acetaminophen Hives  Prednisone Other (Elevated BP)  Ranolazine Other (constipation)  Temazepam Hives  Tetanus Toxoid Fluid (Rash and edema)  Valsartan Other (Tingling)   Past Medical History:  Angina pectoris (CMS-HCC)  Anxiety and depression  Asthma  CAD (coronary artery disease) - native  coronary artery 08/03/2018  Chronic bronchitis (CMS-HCC)  Chronic kidney disease  Coronary artery disease  GERD (gastroesophageal reflux disease)  High cholesterol  History of gout  History of hiatal hernia  History of kidney stones  Hypertension  Interstitial cystitis  NSVT (nonsustained ventricular tachycardia) (CMS-HCC) 07/2018  Raynaud's disease   Past Surgical History:  CORONARY ANGIOPLASTY WITH STENT PLACEMENT 08/02/2018 (RIGHT/LEFT HEART CATH AND CORONARY ANGIOGRAPHY 08/02/2018  Procedure: RIGHT/LEFT HEART CATH AND CORONARY ANGIOGRAPHY; Surgeon: Adrian Prows, MD; Location: [facility]; Service: Cardiovascular)  INTRAVASCULAR PRESSURE WIRE/FFR STUDY 10/07/2018 (Procedure: INTRAVASCULAR PRESSURE WIRE/FFR STUDY; Surgeon: Adrian Prows, MD; Location: [place] [place] [facility]; Service: Cardiovascular; Laterality: N/A;  Right Cardiac Cath 10/07/2018)  LEFT HEART CATH AND CORONARY ANGIOGRAPHY Left 10/07/2018 (Surgeon: Adrian Prows, MD)  LEFT HEART CATH AND CORONARY ANGIOGRAPHY. CORONARY STENT INTERVENTION. Left 04/08/2021 (Surgeon: Adrian Prows, MD)  Right TKA using all-cemented [employer] Vanguard system with a 65 mm PCR femur, a 71 mm tibial tray with a 16 mm anterior stabilized insert, and a 34 x 7.8 mm all-poly 3-pegged domed patella Right 05/12/2022 (Dr.Branton Einstein)  ABDOMINAL HYSTERECTOMY  ANTERIOR CERVICAL DECOMP/DISCECTOMY FUSION  APPENDECTOMY  AUGMENTATION MAMMAPLASTY (Bilateral)  BACK SURGERY  CORONARY STENT INTERVENTION 1126/2019  Procedure: CORONARY STENT INTERVENTION; Surgeon: Adrian Prows, MD; Location: [facility]; Service: Cardiovascular; Laterality: N/A; RCA  CYSTOSCOPY W/ STONE MANIPULATION  LAPAROSCOPIC CHOLECYSTECTOMY  REPAIR ANKLE LIGAMENT Left  Right Knee Arthroscopy  SHOULDER SURGERY  TONSILLECTOMY  TUBAL LIGATION   Family History:  Myocardial Infarction (Heart attack) Father  Heart disease Father  Diabetes Mother  Kidney failure Mother   Social History:    Socioeconomic History:  Marital status: Married  Tobacco Use  Smoking status: Never  Smokeless tobacco: Never  Vaping Use  Vaping Use: Never used  Substance and Sexual Activity  Alcohol use: Never  Drug use: Never   Review of Systems:  A comprehensive 14 point ROS was performed, reviewed, and the pertinent orthopaedic findings are documented in the HPI.  Physical Exam: Vitals:  11/13/22 1049  BP: 132/86  Weight: 94.3 kg (208 lb)  Height: 165.1 cm (5\' 5" )  PainSc: 9  PainLoc: Hip   General/Constitutional: The patient appears to be well-nourished, well-developed, and in no acute distress. Neuro/Psych: Normal mood and affect, oriented to person, place and time. Eyes: Non-icteric. Pupils are equal, round, and reactive to light, and exhibit synchronous movement. ENT: Unremarkable. Lymphatic: No palpable adenopathy. Respiratory: Lungs clear to auscultation, Normal chest excursion, No wheezes, and Non-labored breathing Cardiovascular: Regular rate and rhythm. No murmurs. and No edema, swelling or tenderness, except as noted in detailed exam. Integumentary: No impressive skin lesions present, except as noted in detailed exam. Musculoskeletal: Unremarkable, except as noted in detailed exam.  Left hip exam: Skin inspection of the left hip is unremarkable. No swelling, erythema, ecchymosis, abrasions, or other skin abnormalities are identified. She has mild-moderate tenderness to palpation over the anterolateral aspects of the hip. She is able to flex her hip to 90 degrees, but notes significant pain with hip flexion. She also has severe pain with any attempted  internal and external rotation, tolerating internal rotation to 10 degrees and external rotation to 25 degrees only. She is neurovascularly intact to the left lower extremity and foot.  X-rays/MRI/Lab data:  Recent x-rays of the pelvis and left hip are available for review and have been reviewed by myself. These films demonstrate  only minimal degenerative changes with overall satisfactory maintenance of the clear space. No fractures or lytic lesions are identified.  A recent MRI scan of the left hip is available for review and has been reviewed by myself. By report, the study demonstrates evidence of "advanced left hip arthroplasty arthritis with extensive marrow edema about the left hip and associated degenerative tearing of the labrum. Fluid undermines a small segment of cortical bone along the superior aspect of the humeral head worrisome for delamination and fragment instability." Both the films report were reviewed by myself and discussed with the patient and her husband.  Assessment: 1. Primary osteoarthritis of left hip.   Plan: The treatment options were discussed with the patient and her husband. Regarding her left hip symptoms, the patient is quite frustrated by the significant pain and functional limitations that she is experiencing, and is ready to consider more aggressive treatment options. Based on her MRI scan, I feel that she would best benefit from a total hip arthroplasty. The procedure was discussed with the patient, as were the potential risks (including bleeding, infection, nerve and/or blood vessel injury, persistent or recurrent pain, loosening and/or failure of the components, dislocation, leg length inequality, need for further surgery, blood clots, strokes, heart attacks and/or arhythmias, pneumonia, etc.) and benefits. The patient states her understanding and wishes to proceed. Per her request, refill of tenacity and has been electronically sent to patient's pharmacy. In addition, a prescription for tramadol to help with more severe pain also has been electronically sent to the patient's pharmacy. All of the patient's questions and concerns were answered. She can call any time with further concerns. She will follow up post-surgery, routine. This office visit took 45 minutes, of which >50% involved patient  counseling/education.    H&P reviewed and patient re-examined. No changes.

## 2022-11-24 NOTE — Anesthesia Procedure Notes (Addendum)
Spinal  Patient location during procedure: OR Start time: 11/24/2022 11:05 AM End time: 11/24/2022 11:10 AM Reason for block: surgical anesthesia Staffing Performed: resident/CRNA and other anesthesia staff  Resident/CRNA: Otho Perl, CRNA Other anesthesia staff: Lenord Fellers, RN Performed by: Otho Perl, CRNA Authorized by: Arita Miss, MD   Preanesthetic Checklist Completed: patient identified, IV checked, site marked, risks and benefits discussed, surgical consent, monitors and equipment checked and pre-op evaluation Spinal Block Patient position: sitting Prep: ChloraPrep Patient monitoring: continuous pulse ox, blood pressure and heart rate Approach: midline Location: L4-5 Injection technique: single-shot Needle Needle type: Introducer and Pencan  Needle gauge: 25 G Needle length: 9 cm Assessment Events: paresthesia and CSF return Additional Notes Transient paresthesia with needle advancement that quickly resolved. No paresthesia with injection. Negative blood return. Positive free-flowing CSF. Expiration date of kit checked and confirmed. Patient tolerated procedure well, without complications. Spinal performed by CRNA student.

## 2022-11-24 NOTE — Plan of Care (Signed)
  Problem: Clinical Measurements: Goal: Will remain free from infection Outcome: Progressing   Problem: Nutrition: Goal: Adequate nutrition will be maintained Outcome: Progressing   Problem: Safety: Goal: Ability to remain free from injury will improve Outcome: Progressing   

## 2022-11-24 NOTE — Evaluation (Signed)
Physical Therapy Evaluation Patient Details Name: Tonya Boyd MRN: [id number] DOB: September 15, 1955 Today's Date: 11/24/2022  History of Present Illness  Pt is a 67 yo F diagnosed with degenerative joint disease of the left hip and is s/p elective L THA.  PMH includes CVA, CAD, CKD, gout, HTN, back surgery, COPD, and R TKA.   Clinical Impression  Pt was pleasant and motivated to participate during the session and put forth good effort throughout. Pt required no physical assistance during the session but did require some extra time and effort with tasks as well as cuing to insure compliance with posterior hip precautions.  Pt demonstrated good control and stability with transfers and gait with no adverse symptoms during the session other than mod L hip pain.  Pt is expected to make good progress towards goals while in acute care and will benefit from HHPT upon discharge to safely address deficits listed in patient problem list for decreased caregiver assistance and eventual return to PLOF.          Recommendations for follow up therapy are one component of a multi-disciplinary discharge planning process, led by the attending physician.  Recommendations may be updated based on patient status, additional functional criteria and insurance authorization.  Follow Up Recommendations Home health PT      Assistance Recommended at Discharge Intermittent Supervision/Assistance  Patient can return home with the following  A little help with walking and/or transfers;Assistance with cooking/housework;A little help with bathing/dressing/bathroom;Help with stairs or ramp for entrance;Assist for transportation    Equipment Recommendations None recommended by PT  Recommendations for Other Services       Functional Status Assessment Patient has had a recent decline in their functional status and demonstrates the ability to make significant improvements in function in a reasonable and predictable amount of  time.     Precautions / Restrictions Precautions Precautions: Fall;Posterior Hip Precaution Booklet Issued: Yes (comment) Restrictions Weight Bearing Restrictions: Yes LLE Weight Bearing: Weight bearing as tolerated      Mobility  Bed Mobility Overal bed mobility: Needs Assistance Bed Mobility: Supine to Sit     Supine to sit: Supervision     General bed mobility comments: Min extra time and effort and min verbal cues for sequencing    Transfers Overall transfer level: Needs assistance Equipment used: Rolling walker (2 wheels) Transfers: Sit to/from Stand Sit to Stand: Min guard           General transfer comment: Mod verbal and visual cues for sequencing for post hip prec compliance; good control and stability    Ambulation/Gait Ambulation/Gait assistance: Min guard Gait Distance (Feet): 6 Feet Assistive device: Rolling walker (2 wheels) Gait Pattern/deviations: Step-to pattern, Decreased step length - right, Decreased stance time - left, Antalgic Gait velocity: decreased     General Gait Details: Mildly antalgic step-to pattern but steady with no buckling or LOB  Stairs            Wheelchair Mobility    Modified Rankin (Stroke Patients Only)       Balance Overall balance assessment: Needs assistance   Sitting balance-Leahy Scale: Normal     Standing balance support: Bilateral upper extremity supported, During functional activity Standing balance-Leahy Scale: Good                               Pertinent Vitals/Pain Pain Assessment Pain Assessment: 0-10 Pain Score: 5  Pain Location: L hip Pain  Descriptors / Indicators: Sore Pain Intervention(s): Repositioned, Ice applied, Premedicated before session, Monitored during session    Home Living Family/patient expects to be discharged to:: Private residence Living Arrangements: Spouse/significant other Available Help at Discharge: Family;Available 24 hours/day Type of Home:  House Home Access: Stairs to enter Entrance Stairs-Rails: Right Entrance Stairs-Number of Steps: 4   Home Layout: Two level;Able to live on main level with bedroom/bathroom Home Equipment: Rolling Walker (2 wheels);Cane - single point;BSC/3in1;Shower seat      Prior Function Prior Level of Function : Independent/Modified Independent             Mobility Comments: Ind amb community distances without an AD, no falls in the last 6 months ADLs Comments: Ind with ADLs     Hand Dominance   Dominant Hand: Left    Extremity/Trunk Assessment   Upper Extremity Assessment Upper Extremity Assessment: Overall WFL for tasks assessed    Lower Extremity Assessment Lower Extremity Assessment: LLE deficits/detail LLE Deficits / Details: BLE ankle strength, AROM, and sensation to light touch grossly WNL LLE: Unable to fully assess due to pain       Communication   Communication: No difficulties  Cognition Arousal/Alertness: Awake/alert Behavior During Therapy: WFL for tasks assessed/performed Overall Cognitive Status: Within Functional Limits for tasks assessed                                          General Comments      Exercises Total Joint Exercises Ankle Circles/Pumps: AROM, Strengthening, Both, 10 reps Other Exercises Other Exercises: Posterior hip precaution education and review per handout and during functional tasks   Assessment/Plan    PT Assessment Patient needs continued PT services  PT Problem List Decreased strength;Decreased activity tolerance;Decreased balance;Decreased mobility;Decreased knowledge of use of DME;Decreased knowledge of precautions;Pain       PT Treatment Interventions DME instruction;Gait training;Stair training;Functional mobility training;Therapeutic activities;Therapeutic exercise;Balance training;Patient/family education    PT Goals (Current goals can be found in the Care Plan section)  Acute Rehab PT Goals Patient  Stated Goal: To walk without pain PT Goal Formulation: With patient Time For Goal Achievement: 12/07/22 Potential to Achieve Goals: Good    Frequency BID     Co-evaluation               AM-PAC PT "6 Clicks" Mobility  Outcome Measure Help needed turning from your back to your side while in a flat bed without using bedrails?: A Little Help needed moving from lying on your back to sitting on the side of a flat bed without using bedrails?: A Little Help needed moving to and from a bed to a chair (including a wheelchair)?: A Little Help needed standing up from a chair using your arms (e.g., wheelchair or bedside chair)?: A Little Help needed to walk in hospital room?: A Little Help needed climbing 3-5 steps with a railing? : A Lot 6 Click Score: 17    End of Session Equipment Utilized During Treatment: Gait belt Activity Tolerance: Patient tolerated treatment well Patient left: in chair;with call bell/phone within reach;with chair alarm set;with SCD's reapplied Nurse Communication: Mobility status;Weight bearing status;Precautions PT Visit Diagnosis: Other abnormalities of gait and mobility (R26.89);Muscle weakness (generalized) (M62.81);Pain Pain - Right/Left: Left Pain - part of body: Hip    Time: 1601-1630 PT Time Calculation (min) (ACUTE ONLY): 29 min   Charges:   PT Evaluation $  PT Eval Moderate Complexity: 1 Mod PT Treatments $Therapeutic Activity: 8-22 mins       D. Royetta Asal PT, DPT 11/24/22, 4:50 PM

## 2022-11-24 NOTE — Transfer of Care (Signed)
Immediate Anesthesia Transfer of Care Note  Patient: Tonya Boyd  Procedure(s) Performed: TOTAL HIP ARTHROPLASTY (Left: Hip)  Patient Location: PACU  Anesthesia Type:Spinal  Level of Consciousness: drowsy  Airway & Oxygen Therapy: Patient Spontanous Breathing  Post-op Assessment: Report given to RN  Post vital signs: stable  Last Vitals:  Vitals Value Taken Time  BP 115/56 11/24/22 1332  Temp    Pulse 70 11/24/22 1334  Resp 28 11/24/22 1334  SpO2 100 % 11/24/22 1334  Vitals shown include unvalidated device data.  Last Pain:  Vitals:   11/24/22 0902  TempSrc: Oral  PainSc: 0-No pain         Complications: No notable events documented.

## 2022-11-24 NOTE — Anesthesia Preprocedure Evaluation (Signed)
Anesthesia Evaluation  Patient identified by MRN, date of birth, ID band Patient awake    Reviewed: Allergy & Precautions, NPO status , Patient's Chart, lab work & pertinent test results  History of Anesthesia Complications (+) PONV and history of anesthetic complications  Airway Mallampati: II  TM Distance: >3 FB Neck ROM: Full    Dental no notable dental hx. (+) Teeth Intact   Pulmonary neg sleep apnea, COPD,  COPD inhaler, Patient abstained from smoking.Not current smoker   Pulmonary exam normal breath sounds clear to auscultation       Cardiovascular Exercise Tolerance: Good METShypertension, + angina  + CAD and + Cardiac Stents  (-) Past MI (-) dysrhythmias  Rhythm:Regular Rate:Normal - Systolic murmurs Clean cath, wide open stents in Dec 2023   Neuro/Psych  Headaches PSYCHIATRIC DISORDERS Anxiety Depression       GI/Hepatic hiatal hernia,GERD  Medicated and Controlled,,(+)     (-) substance abuse    Endo/Other  neg diabetes    Renal/GU negative Renal ROS     Musculoskeletal  (+) Arthritis ,    Abdominal   Peds  Hematology   Anesthesia Other Findings Past Medical History: No date: Angina pectoris ([place]) No date: Anxiety No date: Asthma 08/02/2018: CAD (coronary artery disease), native coronary artery     Comment:  a.) R/LHC 08/02/2018: EF 45%, super-dominant large RCA,               80% p-mRCA, 50% oRPDA, 80% D1; mPA 13, mPCWP 8, PA sat               81%, Ao sat 99, CO 6.95, CI 3.65 --> 4.0 x 30 mm Orsiro               DES x1 to [facility]; b.) LHC 10/07/2018: 50-60% oD1 (FFR               0.96), diffuse HG stenosis prox seg very small (<1 mm) RI              --> med mgmt; c.) LHC 04/08/2021: 80% pRCA (4.0 x 16 mm               Synergy XD DES) and 80% oPDA (2.5 x 12 mm Synergy XD DES) No date: Chronic bronchitis (HCC) No date: Chronic kidney disease No date: Complication of anesthesia     Comment:  a.)  postoperative nausea only (no vomiting); also               "shakes"; per patient relieved w/ IV diphenhydramine No date: COPD (chronic obstructive pulmonary disease) (HCC) No date: Depression [id number]: Diastolic dysfunction     Comment:  a.) TTE 07/23/2018: EF 55-60%, GLS -19.6%, G1DD; b.) TTE              11/01/2020: EF 55%, triv AR, G1DD No date: Family history of adverse reaction to anesthesia     Comment:  "cousin stopped breathing; he was allergic to the               anesthesia" (08/02/2018) No date: GERD (gastroesophageal reflux disease) No date: High cholesterol No date: History of gout No date: History of hiatal hernia No date: History of kidney stones 07/2018: Hypertension No date: Interstitial cystitis No date: Long term current use of antithrombotics/antiplatelets     Comment:  a.) daily DAPT therapy (ASA + clopidogrel) No date: Migraines 07/2018: NSVT (nonsustained ventricular tachycardia) ([place])     Comment:  a.) holter 07/13/2018:  6 beat run NSVT; maximum rate 133              bpm. No date: OA (osteoarthritis) No date: PONV (postoperative nausea and vomiting) No date: Raynaud's disease 06/08/2019: Stroke-like episode     Comment:  a.) speech difficulty, right sided               weakness/paraesthesias; unclear etiology ?? possibly               conversion disorder. MRI brain x 2 negative for stroke.  Reproductive/Obstetrics                              Anesthesia Physical Anesthesia Plan  ASA: 3  Anesthesia Plan: Spinal   Post-op Pain Management: Ofirmev IV (intra-op)*   Induction: Intravenous  PONV Risk Score and Plan: 3 and Ondansetron, Dexamethasone, Propofol infusion, TIVA, Midazolam and Treatment may vary due to age or medical condition  Airway Management Planned: Natural Airway  Additional Equipment: None  Intra-op Plan:   Post-operative Plan:   Informed Consent: I have reviewed the patients History and Physical,  chart, labs and discussed the procedure including the risks, benefits and alternatives for the proposed anesthesia with the patient or authorized representative who has indicated his/her understanding and acceptance.       Plan Discussed with: CRNA and Surgeon  Anesthesia Plan Comments: (Discussed R/B/A of neuraxial anesthesia technique with patient: - rare risks of spinal/epidural hematoma, nerve damage, infection - Risk of PDPH - Risk of nausea and vomiting - Risk of conversion to general anesthesia and its associated risks, including sore throat, damage to lips/eyes/teeth/oropharynx, and rare risks such as cardiac and respiratory events. - Risk of allergic reactions  Discussed the role of CRNA in patient's perioperative care.  Patient voiced understanding.)         Anesthesia Quick Evaluation

## 2022-11-24 NOTE — Op Note (Signed)
11/24/2022  1:52 PM  Patient:   Tonya Boyd  Pre-Op Diagnosis:   Degenerative joint disease, left hip.  Post-Op Diagnosis:   Same.  Procedure:   Left total hip arthroplasty.  Surgeon:   Pascal Lux, MD  Assistant:   Cameron Proud, PA-C  Anesthesia:   Spinal  Findings:   As above.  Complications:   None  EBL:   200 cc  Fluids:   1000 cc crystalloid  UOP:   500 cc  TT:   None  Drains:   None  Closure:   Staples  Implants:   Biomet press-fit system with a # 15 bilaterally offset Echo femoral stem, a 50 mm acetabular shell with an E-poly hi-wall liner, and a 36 mm ceramic head with a -6 mm neck adapter.  Brief Clinical Note:   The patient is a 67 year old female with a history of progressively worsening left hip/groin pain. Her symptoms have progressed despite medications, activity modification, use of a walker, etc. Her history and examination are consistent with degenerative joint disease, suggested by plain radiographs, but confirmed by MRI scan. The patient presents at this time for a left total hip arthroplasty.  Procedure:   The patient was brought into the operating room. After adequate spinal anesthesia was obtained, a Foley catheter was placed by the operating room nurse. The patient was repositioned in the right lateral decubitus position and secured using a lateral hip positioner. The left hip and lower extremity were prepped with ChloroPrep solution before being draped sterilely. Preoperative antibiotics were administered. A timeout was performed to verify the appropriate surgical site.    A standard posterior approach to the hip was made through an approximately 7-8 inch incision. The incision was carried down through the subcutaneous tissues to expose the gluteal fascia and proximal end of the iliotibial band. These structures were split the length of the incision and the Charnley self-retaining hip retractor placed. The bursal tissues were swept posteriorly  to expose the short external rotators. The anterior border of the piriformis tendon was identified and this plane developed down through the capsule to enter the joint. A flap of tissue was elevated off the posterior aspect of the femoral neck and greater trochanter and retracted posteriorly. This flap included the piriformis tendon, the short external rotators, and the posterior capsule. The soft tissues were elevated off the lateral aspect of the ilium and a large Steinmann pin placed bicortically.   With the left leg aligned over the right, a drill bit was placed into the greater trochanter parallel to the Steinmann pin and the distance between these two pins measured in order to optimize leg lengths postoperatively. The drill bit was removed and the hip dislocated. The piriformis fossa was debrided of soft tissues before the intramedullary canal was accessed through this point using a triple step reamer. The canal was reamed sequentially beginning with a #7 tapered reamer and progressing to a #15 tapered reamer. This provided excellent circumferential chatter. Using the appropriate guide, a femoral neck cut was made 10-12 mm above the lesser trochanter. The femoral head was removed.  Attention was directed to the acetabular side. The labrum was debrided circumferentially before the ligamentum teres was removed using a large curette. A line was drawn on the drapes corresponding to the native version of the acetabulum. This line was used as a guide while the acetabulum was reamed sequentially beginning with a 43 mm reamer and progressing to a 49 mm reamer. This provided excellent  circumferential chatter. The 49 mm trial acetabulum was positioned and found to fit quite well. Therefore, the 50 mm acetabular shell was selected and impacted into place with care taken to maintain the appropriate version. The trial high wall liner was inserted.  Attention was redirected to the femoral side. A box osteotome was  used to establish version before the canal was broached sequentially beginning with a #7 broach and progressing to a #15 broach. This was left in place and several trial reductions performed using both the standard and laterally offset neck options, as well as the -6 mm and -3 mm neck lengths. After removing the trial components, the "manhole cover" was placed into the apex of the acetabular shell and tightened securely. The permanent E-polyethylene hi-wall liner was impacted into the acetabular shell and its locking mechanism verified using a quarter-inch osteotome. Next, the #15 laterally offset femoral stem was impacted into place with care taken to maintain the appropriate version. A repeat trial reduction was performed using the -6 mm neck length. The -6 mm neck length demonstrated excellent stability both in extension and external rotation as well as with flexion to 90 and internal rotation beyond 70. It also was stable in the position of sleep. In addition, leg lengths appeared to be restored appropriately, both by reassessing the position of the right leg over the left, as well as by measuring the distance between the Steinmann pin and the drill bit. The 36 mm ceramic head with the -6 mm neck adapter was impacted onto the stem of the femoral component. The Morse taper locking mechanism was verified using manual distraction before the head was relocated and placed through a range of motion with the findings as described above.  The wound was copiously irrigated with sterile saline solution via the jet lavage system before the peri-incisional and pericapsular tissues were injected with a "cocktail" of 20 cc of Exparel, 30 cc of 0.5% Sensorcaine, 2 cc of Kenalog 40 (80 mg), and 30 mg of Toradol diluted out to 90 cc with normal saline to help with postoperative analgesia. The posterior flap was reapproximated to the posterior aspect of the greater trochanter using #2 Tycron interrupted sutures placed through  drill holes. Several additional #2 Tycron interrupted sutures were used to reinforce this layer of closure. The iliotibial band was reapproximated using #1 Vicryl interrupted sutures before the gluteal fascia was closed using a running #1 Vicryl suture. At this point, 1 g of transexemic acid in 10 cc of normal saline was injected into the joint to help reduce postoperative bleeding. The subcutaneous tissues were closed in several layers using 2-0 Vicryl interrupted sutures before the skin was closed using staples. A sterile occlusive dressing was applied to the wound. The patient was then rolled back into the supine position on his/her hospital bed before being awakened and returned to the recovery room in satisfactory condition after tolerating the procedure well.

## 2022-11-25 ENCOUNTER — Other Ambulatory Visit (HOSPITAL_COMMUNITY): Payer: Self-pay

## 2022-11-25 ENCOUNTER — Encounter: Payer: Self-pay | Admitting: Surgery

## 2022-11-25 DIAGNOSIS — N189 Chronic kidney disease, unspecified: Secondary | ICD-10-CM | POA: Diagnosis not present

## 2022-11-25 DIAGNOSIS — Z955 Presence of coronary angioplasty implant and graft: Secondary | ICD-10-CM | POA: Diagnosis not present

## 2022-11-25 DIAGNOSIS — Z7901 Long term (current) use of anticoagulants: Secondary | ICD-10-CM | POA: Diagnosis not present

## 2022-11-25 DIAGNOSIS — I25119 Atherosclerotic heart disease of native coronary artery with unspecified angina pectoris: Secondary | ICD-10-CM | POA: Diagnosis not present

## 2022-11-25 DIAGNOSIS — K219 Gastro-esophageal reflux disease without esophagitis: Secondary | ICD-10-CM | POA: Diagnosis not present

## 2022-11-25 DIAGNOSIS — F32A Depression, unspecified: Secondary | ICD-10-CM | POA: Diagnosis not present

## 2022-11-25 DIAGNOSIS — I129 Hypertensive chronic kidney disease with stage 1 through stage 4 chronic kidney disease, or unspecified chronic kidney disease: Secondary | ICD-10-CM | POA: Diagnosis not present

## 2022-11-25 DIAGNOSIS — J441 Chronic obstructive pulmonary disease with (acute) exacerbation: Secondary | ICD-10-CM | POA: Diagnosis not present

## 2022-11-25 DIAGNOSIS — Z7982 Long term (current) use of aspirin: Secondary | ICD-10-CM | POA: Diagnosis not present

## 2022-11-25 DIAGNOSIS — G43909 Migraine, unspecified, not intractable, without status migrainosus: Secondary | ICD-10-CM | POA: Diagnosis not present

## 2022-11-25 DIAGNOSIS — E7800 Pure hypercholesterolemia, unspecified: Secondary | ICD-10-CM | POA: Diagnosis not present

## 2022-11-25 DIAGNOSIS — K449 Diaphragmatic hernia without obstruction or gangrene: Secondary | ICD-10-CM | POA: Diagnosis not present

## 2022-11-25 DIAGNOSIS — Z7902 Long term (current) use of antithrombotics/antiplatelets: Secondary | ICD-10-CM | POA: Diagnosis not present

## 2022-11-25 DIAGNOSIS — K5730 Diverticulosis of large intestine without perforation or abscess without bleeding: Secondary | ICD-10-CM | POA: Diagnosis not present

## 2022-11-25 DIAGNOSIS — M1612 Unilateral primary osteoarthritis, left hip: Secondary | ICD-10-CM | POA: Diagnosis not present

## 2022-11-25 DIAGNOSIS — I7300 Raynaud's syndrome without gangrene: Secondary | ICD-10-CM | POA: Diagnosis not present

## 2022-11-25 DIAGNOSIS — J4551 Severe persistent asthma with (acute) exacerbation: Secondary | ICD-10-CM | POA: Diagnosis not present

## 2022-11-25 DIAGNOSIS — R519 Headache, unspecified: Secondary | ICD-10-CM | POA: Diagnosis not present

## 2022-11-25 LAB — CBC
HCT: 32.4 % — ABNORMAL LOW (ref 36.0–46.0)
Hemoglobin: 10.8 g/dL — ABNORMAL LOW (ref 12.0–15.0)
MCH: 31.3 pg (ref 26.0–34.0)
MCHC: 33.3 g/dL (ref 30.0–36.0)
MCV: 93.9 fL (ref 80.0–100.0)
Platelets: 291 10*3/uL (ref 150–400)
RBC: 3.45 MIL/uL — ABNORMAL LOW (ref 3.87–5.11)
RDW: 13.5 % (ref 11.5–15.5)
WBC: 12.8 10*3/uL — ABNORMAL HIGH (ref 4.0–10.5)
nRBC: 0 % (ref 0.0–0.2)

## 2022-11-25 LAB — BASIC METABOLIC PANEL
Anion gap: 6 (ref 5–15)
BUN: 22 mg/dL (ref 8–23)
CO2: 25 mmol/L (ref 22–32)
Calcium: 8.7 mg/dL — ABNORMAL LOW (ref 8.9–10.3)
Chloride: 103 mmol/L (ref 98–111)
Creatinine, Ser: 0.94 mg/dL (ref 0.44–1.00)
GFR, Estimated: >60 mL/min (ref >60)
Glucose, Bld: 170 mg/dL — ABNORMAL HIGH (ref 70–99)
Potassium: 4.3 mmol/L (ref 3.5–5.1)
Sodium: 134 mmol/L — ABNORMAL LOW (ref 135–145)

## 2022-11-25 MED ORDER — OXYCODONE HCL 5 MG PO TABS: 5.0000 mg | ORAL_TABLET | ORAL | 0 refills | Status: DC | PRN

## 2022-11-25 MED ORDER — TRAMADOL HCL 50 MG PO TABS: 50.0000 mg | ORAL_TABLET | Freq: Four times a day (QID) | ORAL | 0 refills | Status: DC | PRN

## 2022-11-25 MED ORDER — ONDANSETRON HCL 4 MG PO TABS: 4.0000 mg | ORAL_TABLET | Freq: Four times a day (QID) | ORAL | 0 refills | Status: AC | PRN

## 2022-11-25 MED ORDER — ACETAMINOPHEN 500 MG PO TABS: 1000.0000 mg | ORAL_TABLET | Freq: Four times a day (QID) | ORAL | 0 refills | Status: DC

## 2022-11-25 MED ORDER — APIXABAN 2.5 MG PO TABS: 2.5000 mg | ORAL_TABLET | Freq: Two times a day (BID) | ORAL | 0 refills | Status: DC

## 2022-11-25 MED ORDER — ONDANSETRON HCL 4 MG PO TABS: 4.0000 mg | ORAL_TABLET | Freq: Four times a day (QID) | ORAL | 0 refills | Status: DC | PRN

## 2022-11-25 NOTE — Plan of Care (Signed)
  Problem: Education: Goal: Knowledge of General Education information will improve Description: Including pain rating scale, medication(s)/side effects and non-pharmacologic comfort measures 11/25/2022 1509 by Adah Salvage, RN Outcome: Adequate for Discharge 11/25/2022 0720 by Adah Salvage, RN Outcome: Progressing   Problem: Health Behavior/Discharge Planning: Goal: Ability to manage health-related needs will improve 11/25/2022 1509 by Adah Salvage, RN Outcome: Adequate for Discharge 11/25/2022 0720 by Adah Salvage, RN Outcome: Progressing   Problem: Clinical Measurements: Goal: Ability to maintain clinical measurements within normal limits will improve 11/25/2022 1509 by Adah Salvage, RN Outcome: Adequate for Discharge 11/25/2022 0720 by Adah Salvage, RN Outcome: Progressing Goal: Will remain free from infection 11/25/2022 1509 by Adah Salvage, RN Outcome: Adequate for Discharge 11/25/2022 0720 by Adah Salvage, RN Outcome: Progressing Goal: Diagnostic test results will improve 11/25/2022 1509 by Adah Salvage, RN Outcome: Adequate for Discharge 11/25/2022 0720 by Adah Salvage, RN Outcome: Progressing Goal: Respiratory complications will improve 11/25/2022 1509 by Adah Salvage, RN Outcome: Adequate for Discharge 11/25/2022 0720 by Adah Salvage, RN Outcome: Progressing Goal: Cardiovascular complication will be avoided 11/25/2022 1509 by Adah Salvage, RN Outcome: Adequate for Discharge 11/25/2022 0720 by Adah Salvage, RN Outcome: Progressing   Problem: Activity: Goal: Risk for activity intolerance will decrease 11/25/2022 1509 by Adah Salvage, RN Outcome: Adequate for Discharge 11/25/2022 0720 by Adah Salvage, RN Outcome: Progressing   Problem: Nutrition: Goal: Adequate nutrition will be maintained 11/25/2022 1509 by Adah Salvage, RN Outcome: Adequate for Discharge 11/25/2022 0720 by Adah Salvage, RN Outcome:  Progressing   Problem: Coping: Goal: Level of anxiety will decrease 11/25/2022 1509 by Adah Salvage, RN Outcome: Adequate for Discharge 11/25/2022 0720 by Adah Salvage, RN Outcome: Progressing   Problem: Elimination: Goal: Will not experience complications related to bowel motility 11/25/2022 1509 by Adah Salvage, RN Outcome: Adequate for Discharge 11/25/2022 0720 by Adah Salvage, RN Outcome: Progressing Goal: Will not experience complications related to urinary retention 11/25/2022 1509 by Adah Salvage, RN Outcome: Adequate for Discharge 11/25/2022 0720 by Adah Salvage, RN Outcome: Progressing   Problem: Pain Managment: Goal: General experience of comfort will improve 11/25/2022 1509 by Adah Salvage, RN Outcome: Adequate for Discharge 11/25/2022 0720 by Adah Salvage, RN Outcome: Progressing   Problem: Safety: Goal: Ability to remain free from injury will improve 11/25/2022 1509 by Adah Salvage, RN Outcome: Adequate for Discharge 11/25/2022 0720 by Adah Salvage, RN Outcome: Progressing   Problem: Skin Integrity: Goal: Risk for impaired skin integrity will decrease 11/25/2022 1509 by Adah Salvage, RN Outcome: Adequate for Discharge 11/25/2022 0720 by Adah Salvage, RN Outcome: Progressing

## 2022-11-25 NOTE — Discharge Summary (Signed)
Physician Discharge Summary  Patient ID: Tonya Boyd MRN: [id number] DOB/AGE: [birth year] 67 y.o.  Admit date: 11/24/2022 Discharge date: 11/25/2022  Admission Diagnoses:  Status post total hip replacement, left [Z96.642] Degenerative joint disease of the left hip.  Discharge Diagnoses: Patient Active Problem List   Diagnosis Date Noted   Status post total hip replacement, left 11/24/2022   Status post total knee replacement using cement, right 05/12/2022   Severe persistent asthma with status asthmaticus    Acute respiratory failure with hypoxia ([place]) 04/02/2022   Asthma, chronic, unspecified asthma severity, with acute exacerbation 04/02/2022   Migraine headache 04/02/2022   GERD (gastroesophageal reflux disease) 04/02/2022   Hyperlipidemia 04/02/2022   Raynaud's disease without gangrene 10/15/2018   Angina pectoris ([place]) 10/07/2018   Essential hypertension 08/03/2018   CAD (coronary artery disease), native coronary artery 08/03/2018   Post PTCA 08/02/2018   Elevated IgE level 07/19/2018   Dyspnea on exertion 07/19/2018   Long-term exposure involving bird droppings 07/19/2018   Iliotibial band syndrome of left side 10/21/2011    Past Medical History:  Diagnosis Date   Angina pectoris ([place])    Anxiety    Asthma    CAD (coronary artery disease), native coronary artery 08/02/2018   a.) [facility] 08/02/2018: EF 45%, super-dominant large RCA, 80% p-mRCA, 50% oRPDA, 80% D1; mPA 13, mPCWP 8, PA sat 81%, Ao sat 99, CO 6.95, CI 3.65 --> 4.0 x 30 mm Orsiro DES x1 to [facility]; b.) [facility] 10/07/2018: 50-60% oD1 (FFR 0.96), diffuse HG stenosis prox seg very small (<1 mm) RI --> med mgmt; c.) LHC 04/08/2021: 80% pRCA (4.0 x 16 mm Synergy XD DES) and 80% oPDA (2.5 x 12 mm Synergy XD DES)   Chronic bronchitis (HCC)    Chronic kidney disease    Complication of anesthesia    a.) postoperative nausea only (no vomiting); also "shakes"; per patient relieved w/ IV diphenhydramine   COPD (chronic  obstructive pulmonary disease) ([place])    Depression    Diastolic dysfunction [id number]   a.) TTE 07/23/2018: EF 55-60%, GLS -19.6%, G1DD; b.) TTE 11/01/2020: EF 55%, triv AR, G1DD   Family history of adverse reaction to anesthesia    "cousin stopped breathing; he was allergic to the anesthesia" (08/02/2018)   GERD (gastroesophageal reflux disease)    High cholesterol    History of gout    History of hiatal hernia    History of kidney stones    Hypertension 07/2018   Interstitial cystitis    Long term current use of antithrombotics/antiplatelets    a.) daily DAPT therapy (ASA + clopidogrel)   Migraines    NSVT (nonsustained ventricular tachycardia) ([place]) 07/2018   a.) holter 07/13/2018: 6 beat run NSVT; maximum rate 133 bpm.   OA (osteoarthritis)    PONV (postoperative nausea and vomiting)    Raynaud's disease    Stroke-like episode 06/08/2019   a.) speech difficulty, right sided weakness/paraesthesias; unclear etiology ?? possibly conversion disorder. MRI brain x 2 negative for stroke.     Transfusion: None.   Consultants (if any):   Discharged Condition: Improved  Hospital Course: Tonya Boyd is an 67 y.o. female who was admitted 11/24/2022 with a diagnosis of left hip degenerative joint disease and went to the operating room on 11/24/2022 and underwent the above named procedures.    Surgeries: Procedure(s): TOTAL HIP ARTHROPLASTY on 11/24/2022 Patient tolerated the surgery well. Taken to PACU where she was stabilized and then transferred to the orthopedic floor.  Started  on Eliquis 2.5 mg every 12 hrs.  Patient was instructed to stop her current Plavix and begin Eliquis. Heels elevated on bed with rolled towels. No evidence of DVT. Negative [place]. Physical therapy started on day #1 for gait training and transfer. OT started day #1 for ADL and assisted devices.  Patient's IV and foley were removed on POD1.  Implants: Biomet press-fit system with a # 15 bilaterally offset  Echo femoral stem, a 50 mm acetabular shell with an E-poly hi-wall liner, and a 36 mm ceramic head with a -6 mm neck adapter.   She was given perioperative antibiotics:  Anti-infectives (From admission, onward)    Start     Dose/Rate Route Frequency Ordered Stop   11/24/22 1645  vancomycin (VANCOCIN) IVPB 1000 mg/200 mL premix  Status:  Discontinued        1,000 mg 200 mL/hr over 60 Minutes Intravenous Every 12 hours 11/24/22 1554 11/24/22 1610   11/24/22 0600  vancomycin (VANCOCIN) IVPB 1000 mg/200 mL premix        1,000 mg 200 mL/hr over 60 Minutes Intravenous On call to O.R. 11/24/22 0114 11/24/22 1012     .  She was given sequential compression devices, early ambulation, and Eliquis for DVT prophylaxis.  She benefited maximally from the hospital stay and there were no complications.    Recent vital signs:  Vitals:   11/25/22 0400 11/25/22 0812  BP: 125/82 (!) 121/55  Pulse: 85 93  Resp: 15 17  Temp: 98 F (36.7 C) 98.2 F (36.8 C)  SpO2: 98% 100%    Recent laboratory studies:  Lab Results  Component Value Date   HGB 10.8 (L) 11/25/2022   HGB 13.1 11/20/2022   HGB 13.3 08/21/2022   Lab Results  Component Value Date   WBC 12.8 (H) 11/25/2022   PLT 291 11/25/2022   Lab Results  Component Value Date   INR 1.1 06/13/2019   Lab Results  Component Value Date   NA 134 (L) 11/25/2022   K 4.3 11/25/2022   CL 103 11/25/2022   CO2 25 11/25/2022   BUN 22 11/25/2022   CREATININE 0.94 11/25/2022   GLUCOSE 170 (H) 11/25/2022    Discharge Medications:   Allergies as of 11/25/2022       Reactions   Adhesive [tape] Other (See Comments)   SKIN WILL TEAR EASILY!!!!   Brilinta [ticagrelor] Shortness Of Breath   Statins Other (See Comments)   myalgia   Carvedilol    GI upset   Diovan [valsartan]    Tingling    Hydrochlorothiazide    Urinary frequency   Ibuprofen    Causes bp to go up   Keflex [cephalexin]    confusion   Morphine And Related    Hyper,  ineffective    Norvasc [amlodipine Besylate]    Numbness and tingling    Nsaids    Esophagus became red and swollen (tolerates meloxicam)    Prednisone    Elevated BP   Reglan [metoclopramide]    Confusion, altered mental status   Singulair [montelukast] Diarrhea   Temazepam Hives        Medication List     STOP taking these medications    aspirin EC 81 MG tablet       TAKE these medications    acebutolol 200 MG capsule Commonly known as: SECTRAL Take 1 capsule (200 mg total) by mouth 2 (two) times daily.   acetaminophen 500 MG tablet Commonly known as: TYLENOL Take  2 tablets (1,000 mg total) by mouth every 6 (six) hours. What changed:  when to take this reasons to take this   apixaban 2.5 MG Tabs tablet Commonly known as: ELIQUIS Take 1 tablet (2.5 mg total) by mouth 2 (two) times daily.   cyanocobalamin 1000 MCG/ML injection Commonly known as: VITAMIN B12 Inject 1,000 mcg into the skin every 30 (thirty) days.   cyclobenzaprine 10 MG tablet Commonly known as: FLEXERIL Take 10 mg by mouth at bedtime.   divalproex 250 MG 24 hr tablet Commonly known as: DEPAKOTE ER Take 1 tablet (250 mg total) by mouth daily. What changed: when to take this   hydrOXYzine 25 MG tablet Commonly known as: ATARAX Take 25 mg by mouth 4 (four) times daily as needed for itching.   isosorbide mononitrate 30 MG 24 hr tablet Commonly known as: IMDUR Take 1 tablet (30 mg total) by mouth daily.   loratadine 10 MG tablet Commonly known as: CLARITIN Take 10 mg by mouth at bedtime.   losartan 25 MG tablet Commonly known as: COZAAR Take 1 tablet (25 mg total) by mouth every evening.   nitroGLYCERIN 0.4 MG SL tablet Commonly known as: NITROSTAT DISSOLVE 1 TABLET SUBLINGUALLY AS NEEDED FOR CHEST PAIN, MAY REPEAT EVERY 5 MINUTES. AFTER 3 CALL 911.   ondansetron 4 MG tablet Commonly known as: ZOFRAN Take 1 tablet (4 mg total) by mouth every 6 (six) hours as needed for nausea.    oxyCODONE 5 MG immediate release tablet Commonly known as: Oxy IR/ROXICODONE Take 1-2 tablets (5-10 mg total) by mouth every 4 (four) hours as needed for severe pain.   pantoprazole 20 MG tablet Commonly known as: PROTONIX Take 1 tablet (20 mg total) by mouth daily.   Repatha SureClick [id number] MG/ML Soaj Generic drug: Evolocumab INJECT 140 MG INTO THE SKIN EVERY 2 WEEKS.   Symbicort 80-4.5 MCG/ACT inhaler Generic drug: budesonide-formoterol Inhale 2 puffs into the lungs daily as needed (Coughing).   traMADol 50 MG tablet Commonly known as: ULTRAM Take 1 tablet (50 mg total) by mouth every 6 (six) hours as needed (Breakthrough pain).   traZODone 50 MG tablet Commonly known as: DESYREL Take 100 mg by mouth at bedtime.   Vitamin D (Ergocalciferol) 1.25 MG (50000 UNIT) Caps capsule Commonly known as: DRISDOL Take 50,000 Units by mouth every Monday.               Durable Medical Equipment  (From admission, onward)           Start     Ordered   11/24/22 1555  DME Bedside commode  Once       Question:  Patient needs a bedside commode to treat with the following condition  Answer:  Status post total hip replacement, left   11/24/22 1554   11/24/22 1555  DME 3 n 1  Once        11/24/22 1554   11/24/22 1555  DME Walker rolling  Once       Question Answer Comment  Walker: With 5 Inch Wheels   Patient needs a walker to treat with the following condition Status post total hip replacement, left      11/24/22 1554            Diagnostic Studies: DG HIP UNILAT W OR W/O PELVIS 2-3 VIEWS LEFT  Result Date: 11/24/2022 CLINICAL DATA:  Status post left hip arthroplasty. EXAM: DG HIP (WITH OR WITHOUT PELVIS) 2-3V LEFT COMPARISON:  Hip MRI examination dated  November 02, 2022 FINDINGS: Status post left hip arthroplasty with intact hardware. No periprosthetic fracture. Emphysema about the prosthesis and in the subcutaneous region. Multiple surgical sutures over the lateral aspect  of the thigh. IMPRESSION: Status post left hip arthroplasty without acute complications, postsurgical changes as expected. Electronically Signed   By: Keane Police D.O.   On: 11/24/2022 14:13   MR HIP LEFT WO CONTRAST  Result Date: 11/04/2022 CLINICAL DATA:  Left hip and groin pain for 1 month. EXAM: MR OF THE LEFT HIP WITHOUT CONTRAST TECHNIQUE: Multiplanar, multisequence MR imaging was performed. No intravenous contrast was administered. COMPARISON:  None Available. FINDINGS: Bones: There is extensive marrow edema about the left hip. Fluid undermines a segment of cortical bone along the superior aspect of the humeral head measuring 1.1 cm AP x 1.6 cm transverse worrisome for delamination and fragment instability. No fracture is identified. No focal marrow lesion. Articular cartilage and labrum Articular cartilage: Markedly thinned on the left, worst posteriorly. Labrum: Degenerative tearing of the anterior and superior labrum is present. Joint or bursal effusion Joint effusion:  Small left hip effusion. Bursae: Negative. Muscles and tendons Muscles and tendons:  Intact. Other findings Miscellaneous: Status post hysterectomy. Sigmoid diverticulosis noted. IMPRESSION: 1. Advanced left hip osteoarthritis with extensive marrow edema about the left hip and associated degenerative tearing of the labrum. Fluid undermines a small segment of cortical bone along the superior aspect of the humeral head worrisome for delamination and fragment instability. 2. Sigmoid diverticulosis. Electronically Signed   By: Inge Rise M.D.   On: 11/04/2022 09:45    Disposition: Plan for discharge home today pending progress with PT.  Will proceed with Eliquis for DVT prophylaxis for two weeks and then resume Plavix.   Follow-up Information     Poggi, Marshall Cork, MD Follow up in 14 day(s).   Specialty: Orthopedic Surgery Why: Follow-up in office with PA in 10-14 days for staple removal. Contact information: [place] [address] [phone]                Signed: Judson Roch PA-C 11/25/2022, 3:04 PM

## 2022-11-25 NOTE — Plan of Care (Signed)

## 2022-11-25 NOTE — Anesthesia Postprocedure Evaluation (Signed)
Anesthesia Post Note  Patient: Tonya Boyd  Procedure(s) Performed: TOTAL HIP ARTHROPLASTY (Left: Hip)  Patient location during evaluation: Nursing Unit Anesthesia Type: Spinal Level of consciousness: oriented and awake and alert Pain management: pain level controlled Vital Signs Assessment: post-procedure vital signs reviewed and stable Respiratory status: spontaneous breathing and respiratory function stable Cardiovascular status: blood pressure returned to baseline and stable Postop Assessment: no headache, no backache, no apparent nausea or vomiting and patient able to bend at knees Anesthetic complications: no  No notable events documented.   Last Vitals:  Vitals:   11/25/22 0019 11/25/22 0400  BP: 113/76 125/82  Pulse: 82 85  Resp: 15 15  Temp: 36.5 C 36.7 C  SpO2: 96% 98%    Last Pain:  Vitals:   11/25/22 0726  TempSrc:   PainSc: 4                  [name] [address]

## 2022-11-25 NOTE — Progress Notes (Signed)
Subjective: 1 Day Post-Op Procedure(s) (LRB): TOTAL HIP ARTHROPLASTY (Left) Patient reports pain as mild.   Patient is well, and has had no acute complaints or problems Plan is to go Home after hospital stay. Negative for chest pain and shortness of breath Fever: no Gastrointestinal:Negative for nausea and vomiting  Objective: Vital signs in last 24 hours: Temp:  [96.8 F (36 C)-98.4 F (36.9 C)] 98 F (36.7 C) (03/20 0400) Pulse Rate:  [67-88] 85 (03/20 0400) Resp:  [6-18] 15 (03/20 0400) BP: (105-170)/(56-94) 125/82 (03/20 0400) SpO2:  [96 %-100 %] 98 % (03/20 0400) Weight:  [93.4 kg] 93.4 kg (03/19 0902)  Intake/Output from previous day:  Intake/Output Summary (Last 24 hours) at 11/25/2022 0742 Last data filed at 11/25/2022 0410 Gross per 24 hour  Intake 2350 ml  Output 1600 ml  Net 750 ml    Intake/Output this shift: No intake/output data recorded.  Labs: Recent Labs    11/25/22 0647  HGB 10.8*   Recent Labs    11/25/22 0647  WBC 12.8*  RBC 3.45*  HCT 32.4*  PLT 291   Recent Labs    11/25/22 0647  NA 134*  K 4.3  CL 103  CO2 25  BUN 22  CREATININE 0.94  GLUCOSE 170*  CALCIUM 8.7*   No results for input(s): "LABPT", "INR" in the last 72 hours.   EXAM General - Patient is Alert, Appropriate, and Oriented Extremity - ABD soft Neurovascular intact Dorsiflexion/Plantar flexion intact Incision: scant drainage No cellulitis present Compartment soft Dressing/Incision - Mild bloody drainage to the left hip honeycomb dressing. Motor Function - intact, moving foot and toes well on exam.  Abdomen soft with intact bowel sounds this morning.  Past Medical History:  Diagnosis Date   Angina pectoris ([place])    Anxiety    Asthma    CAD (coronary artery disease), native coronary artery 08/02/2018   a.) [facility] 08/02/2018: EF 45%, super-dominant large RCA, 80% p-mRCA, 50% oRPDA, 80% D1; mPA 13, mPCWP 8, PA sat 81%, Ao sat 99, CO 6.95, CI 3.65 --> 4.0 x 30  mm Orsiro DES x1 to [facility]; b.) [facility] 10/07/2018: 50-60% oD1 (FFR 0.96), diffuse HG stenosis prox seg very small (<1 mm) RI --> med mgmt; c.) [facility] 04/08/2021: 80% pRCA (4.0 x 16 mm Synergy XD DES) and 80% oPDA (2.5 x 12 mm Synergy XD DES)   Chronic bronchitis (HCC)    Chronic kidney disease    Complication of anesthesia    a.) postoperative nausea only (no vomiting); also "shakes"; per patient relieved w/ IV diphenhydramine   COPD (chronic obstructive pulmonary disease) ([place])    Depression    Diastolic dysfunction Q000111Q   a.) TTE 07/23/2018: EF 55-60%, GLS -19.6%, G1DD; b.) TTE 11/01/2020: EF 55%, triv AR, G1DD   Family history of adverse reaction to anesthesia    "cousin stopped breathing; he was allergic to the anesthesia" (08/02/2018)   GERD (gastroesophageal reflux disease)    High cholesterol    History of gout    History of hiatal hernia    History of kidney stones    Hypertension 07/2018   Interstitial cystitis    Long term current use of antithrombotics/antiplatelets    a.) daily DAPT therapy (ASA + clopidogrel)   Migraines    NSVT (nonsustained ventricular tachycardia) ([place]) 07/2018   a.) holter 07/13/2018: 6 beat run NSVT; maximum rate 133 bpm.   OA (osteoarthritis)    PONV (postoperative nausea and vomiting)    Raynaud's disease  Stroke-like episode 06/08/2019   a.) speech difficulty, right sided weakness/paraesthesias; unclear etiology ?? possibly conversion disorder. MRI brain x 2 negative for stroke.    Assessment/Plan: 1 Day Post-Op Procedure(s) (LRB): TOTAL HIP ARTHROPLASTY (Left) Principal Problem:   Status post total hip replacement, left  Estimated body mass index is 35.36 kg/m as calculated from the following:   Height as of this encounter: 5\' 4"  (1.626 m).   Weight as of this encounter: 93.4 kg. Advance diet Up with therapy D/C IV fluids when tolerating po intake.  Labs and vitals reviewed this AM.  Hg 10.8. Up with therapy today. Will place order  for abduction pillow. Patient is passing gas without pain this morning. Plan for possible d/c home today pending progress with PT.  DVT Prophylaxis - TED hose and Eliquis Weight-Bearing as tolerated to left leg  J. Cameron Proud, PA-C [facility] Orthopaedic Surgery 11/25/2022, 7:42 AM

## 2022-11-25 NOTE — Evaluation (Signed)
Occupational Therapy Evaluation Patient Details Name: Tonya Boyd MRN: [id number] DOB: 10/28/1955 Today's Date: 11/25/2022   History of Present Illness Pt is a 67 yo F diagnosed with degenerative joint disease of the left hip and is s/p elective L THA.  PMH includes CVA, CAD, CKD, gout, HTN, back surgery, COPD, and R TKA.   Clinical Impression   Patient agreeable to OT evaluation, spouse present. Pt presenting with decreased independence in self care, balance, functional mobility/transfers, and endurance. Pt/spouse were educated on LLE WBAT, posterior hip precautions, adaptive strategies for dressing including use of AE (reacher, sock aid), fall safety, and use of tub transfer bench for bathing. Pt/spouse verbalized understanding of all education provided. She required set up-supervision for seated LB dressing using AE and Min guard for safety to hike underwear in standing. Pt already has a reacher and will have assistance from spouse as needed upon discharge for LB dressing. Pt will benefit from acute OT to increase overall independence in the areas of ADLs and functional mobility in order to safely discharge home. No follow up OT recommended at D/C.    Recommendations for follow up therapy are one component of a multi-disciplinary discharge planning process, led by the attending physician.  Recommendations may be updated based on patient status, additional functional criteria and insurance authorization.   Follow Up Recommendations  No OT follow up     Assistance Recommended at Discharge Intermittent Supervision/Assistance  Patient can return home with the following A little help with walking and/or transfers;A little help with bathing/dressing/bathroom;Assistance with cooking/housework;Assist for transportation;Help with stairs or ramp for entrance    Functional Status Assessment  Patient has had a recent decline in their functional status and demonstrates the ability to make  significant improvements in function in a reasonable and predictable amount of time.  Equipment Recommendations  Tub/shower bench;Other (comment) (sock aid - getting one from hospital gift shop)    Recommendations for Other Services       Precautions / Restrictions Precautions Precautions: Fall;Posterior Hip Precaution Booklet Issued: Yes (comment) Restrictions Weight Bearing Restrictions: Yes LLE Weight Bearing: Weight bearing as tolerated      Mobility Bed Mobility Overal bed mobility: Needs Assistance Bed Mobility: Supine to Sit, Sit to Supine     Supine to sit: Supervision, HOB elevated Sit to supine: Min assist (to manage BLEs)        Transfers Overall transfer level: Needs assistance Equipment used: Rolling walker (2 wheels) Transfers: Sit to/from Stand Sit to Stand: Min guard           General transfer comment: STS from EOB      Balance Overall balance assessment: Needs assistance   Sitting balance-Leahy Scale: Normal     Standing balance support: During functional activity, Bilateral upper extremity supported, Single extremity supported Standing balance-Leahy Scale: Good                             ADL either performed or assessed with clinical judgement   ADL Overall ADL's : Needs assistance/impaired                     Lower Body Dressing: Adhering to hip precautions;With adaptive equipment;Sit to/from stand;Set up;Sitting/lateral leans;Min guard;Cueing for compensatory techniques Lower Body Dressing Details (indicate cue type and reason): set up A provided for use of reacher and sock aid in sitting, Min guard for safety to hike underwear over B hips in standing  Toilet Transfer: Rolling walker (2 wheels);Min Psychiatric nurse Details (indicate cue type and reason): simulated Toileting- Clothing Manipulation and Hygiene: Min guard;Sit to/from stand       Functional mobility during ADLs: Min guard;Rolling walker (2  wheels) (to take ~4 lateral steps at EOB)       Vision Baseline Vision/History: 1 Wears glasses Patient Visual Report: No change from baseline       Perception     Praxis      Pertinent Vitals/Pain Pain Assessment Pain Assessment: 0-10 Pain Score: 4  Pain Location: L hip Pain Descriptors / Indicators: Sore Pain Intervention(s): Ice applied, Monitored during session, Premedicated before session, Repositioned     Hand Dominance Left   Extremity/Trunk Assessment Upper Extremity Assessment Upper Extremity Assessment: Overall WFL for tasks assessed   Lower Extremity Assessment Lower Extremity Assessment: LLE deficits/detail LLE Deficits / Details: s/p THR       Communication Communication Communication: No difficulties   Cognition Arousal/Alertness: Awake/alert Behavior During Therapy: WFL for tasks assessed/performed Overall Cognitive Status: Within Functional Limits for tasks assessed         General Comments       Exercises Other Exercises Other Exercises: OT provided education re: role of OT, OT POC, post acute recs, sitting up for all meals, EOB/OOB mobility with assistance, home/fall safety, LLE WBAT, LB dressing AE, posterior hip precautions    Shoulder Instructions      Home Living Family/patient expects to be discharged to:: Private residence Living Arrangements: Spouse/significant other Available Help at Discharge: Family;Available 24 hours/day Type of Home: House Home Access: Stairs to enter [employer] of Steps: 4 Entrance Stairs-Rails: Right Home Layout: Two level;Able to live on main level with bedroom/bathroom     Bathroom Shower/Tub: Teacher, early years/pre: Standard     Home Equipment: Conservation officer, nature (2 wheels);Cane - single point;BSC/3in1;Shower seat;Adaptive equipment Adaptive Equipment: Reacher        Prior Functioning/Environment Prior Level of Function : Independent/Modified Independent              Mobility Comments: Ind amb community distances without an AD, no falls in the last 6 months ADLs Comments: Ind with ADLs/IADLs, does not drive        OT Problem List: Decreased strength;Decreased range of motion;Decreased activity tolerance;Impaired balance (sitting and/or standing);Pain;Decreased knowledge of use of DME or AE;Decreased knowledge of precautions      OT Treatment/Interventions: Self-care/ADL training;Therapeutic exercise;Neuromuscular education;Energy conservation;DME and/or AE instruction;Manual therapy;Modalities;Balance training;Patient/family education;Visual/perceptual remediation/compensation;Cognitive remediation/compensation;Therapeutic activities;Splinting    OT Goals(Current goals can be found in the care plan section) Acute Rehab OT Goals Patient Stated Goal: return to PLOF, go home OT Goal Formulation: With patient/family Time For Goal Achievement: 12/09/22 Potential to Achieve Goals: Good   OT Frequency: Min 2X/week    Co-evaluation              AM-PAC OT "6 Clicks" Daily Activity     Outcome Measure Help from another person eating meals?: None Help from another person taking care of personal grooming?: None Help from another person toileting, which includes using toliet, bedpan, or urinal?: A Little Help from another person bathing (including washing, rinsing, drying)?: A Little Help from another person to put on and taking off regular upper body clothing?: None Help from another person to put on and taking off regular lower body clothing?: A Little 6 Click Score: 21   End of Session Equipment Utilized During Treatment: Gait belt;Rolling walker (2 wheels) Nurse  Communication: Mobility status;Precautions;Weight bearing status  Activity Tolerance: Patient tolerated treatment well Patient left: in bed;with call bell/phone within reach;with bed alarm set;with family/visitor present  OT Visit Diagnosis: Other abnormalities of gait and mobility  (R26.89);Muscle weakness (generalized) (M62.81);Pain Pain - Right/Left: Left Pain - part of body: Hip                Time: 1135-1204 OT Time Calculation (min): 29 min Charges:  OT General Charges $OT Visit: 1 Visit OT Evaluation $OT Eval Moderate Complexity: 1 Mod  [facility] MS, OTR/L ascom [phone]  11/25/22, 2:49 PM

## 2022-11-25 NOTE — Discharge Instructions (Signed)
Instructions after Total Hip Replacement     J. Dorien Chihuahua, M.D.  Raquel Darlin Stenseth, PA-C     [facility]  [address] [place], [place]  [address]  Phone: [phone]   Fax: [phone]    DIET: Drink plenty of non-alcoholic fluids. Resume your normal diet. Include foods high in fiber.  ACTIVITY:  You may use crutches or a walker with weight-bearing as tolerated, unless instructed otherwise. You may be weaned off of the walker or crutches by your Physical Therapist.  Do NOT reach below the level of your knees or cross your legs until allowed.    Continue doing gentle exercises. Exercising will reduce the pain and swelling, increase motion, and prevent muscle weakness.   Please continue to use the TED compression stockings for 6 weeks. You may remove the stockings at night, but should reapply them in the morning. Do not drive or operate any equipment until instructed.  WOUND CARE:  Continue to use ice packs periodically to reduce pain and swelling. Keep the incision clean and dry. You may bathe or shower after the staples are removed at the first office visit following surgery.  MEDICATIONS: You may resume your regular medications. Please take the pain medication as prescribed on the medication. Do not take pain medication on an empty stomach. You have been given a prescription for a blood thinner to prevent blood clots. You will take Eliquis for 15 days and then transition back to your plavix. Pain medications and iron supplements can cause constipation. Use a stool softener (Senokot or Colace) on a daily basis and a laxative (dulcolax or miralax) as needed. Do not drive or drink alcoholic beverages when taking pain medications.  CALL THE OFFICE FOR: Temperature above 101 degrees Excessive bleeding or drainage on the dressing. Excessive swelling, coldness, or paleness of the toes. Persistent nausea and  vomiting.  FOLLOW-UP:  You should have an appointment to return to the office in 2 weeks after surgery. Arrangements have been made for continuation of Physical Therapy (either home therapy or outpatient therapy).

## 2022-11-25 NOTE — Progress Notes (Signed)
Physical Therapy Treatment Patient Details Name: Tonya Boyd MRN: [id number] DOB: Nov 11, 1955 Today's Date: 11/25/2022   History of Present Illness Pt is a 67 yo F diagnosed with degenerative joint disease of the left hip and is s/p elective L THA.  PMH includes CVA, CAD, CKD, gout, HTN, back surgery, COPD, and R TKA.    PT Comments    Pt was pleasant and motivated to participate during the session and put forth good effort throughout. Pt required no physical assistance during the session and presented with good control and stability throughout.  Pt steady with transfers and gait and was able to amb 40 feet with distance somewhat limited by L hip pain with WB with pt self-selecting step-to pattern for pain control.  Pt demonstrated fair carryover of proper sequencing for post hip prec compliance during functional tasks but did require cuing during the session with spouse present for training.  Pt will benefit from HHPT upon discharge to safely address deficits listed in patient problem list for decreased caregiver assistance and eventual return to PLOF.     Recommendations for follow up therapy are one component of a multi-disciplinary discharge planning process, led by the attending physician.  Recommendations may be updated based on patient status, additional functional criteria and insurance authorization.  Follow Up Recommendations  Home health PT     Assistance Recommended at Discharge Intermittent Supervision/Assistance  Patient can return home with the following A little help with walking and/or transfers;Assistance with cooking/housework;A little help with bathing/dressing/bathroom;Help with stairs or ramp for entrance;Assist for transportation   Equipment Recommendations  None recommended by PT    Recommendations for Other Services       Precautions / Restrictions Precautions Precautions: Fall;Posterior Hip Precaution Booklet Issued: Yes (comment) Restrictions Weight  Bearing Restrictions: Yes LLE Weight Bearing: Weight bearing as tolerated     Mobility  Bed Mobility Overal bed mobility: Needs Assistance Bed Mobility: Supine to Sit     Supine to sit: Supervision     General bed mobility comments: Min extra time and effort and min verbal cues for sequencing    Transfers Overall transfer level: Needs assistance Equipment used: Rolling walker (2 wheels) Transfers: Sit to/from Stand Sit to Stand: Min guard           General transfer comment: Mod verbal and visual cues for sequencing for post hip prec compliance; good control and stability from multiple height surfaces    Ambulation/Gait Ambulation/Gait assistance: Min guard Gait Distance (Feet): 40 Feet Assistive device: Rolling walker (2 wheels) Gait Pattern/deviations: Step-to pattern, Decreased step length - right, Decreased stance time - left, Antalgic Gait velocity: decreased     General Gait Details: Pt self-selected step-to sequencing for L hip pain control but was steady with no overt LOB, mod verbal and visual cues for proper sequencing during 90 deg L turns to prevent CKC hip IR   Stairs             Wheelchair Mobility    Modified Rankin (Stroke Patients Only)       Balance Overall balance assessment: Needs assistance   Sitting balance-Leahy Scale: Normal     Standing balance support: During functional activity, Bilateral upper extremity supported Standing balance-Leahy Scale: Good                              Cognition Arousal/Alertness: Awake/alert Behavior During Therapy: WFL for tasks assessed/performed Overall Cognitive Status: Within Functional  Limits for tasks assessed                                          Exercises Other Exercises Other Exercises: Posterior hip precaution education and review per handout and during functional tasks with pt and spouse Other Exercises: 90 deg L turn training to prevent CKC hip  IR    General Comments        Pertinent Vitals/Pain Pain Assessment Pain Assessment: 0-10 Pain Score: 4  Pain Location: L hip Pain Descriptors / Indicators: Sore Pain Intervention(s): Repositioned, Premedicated before session, Monitored during session, Ice applied    Home Living                          Prior Function            PT Goals (current goals can now be found in the care plan section) Progress towards PT goals: Progressing toward goals    Frequency    BID      PT Plan Current plan remains appropriate    Co-evaluation              AM-PAC PT "6 Clicks" Mobility   Outcome Measure  Help needed turning from your back to your side while in a flat bed without using bedrails?: A Little Help needed moving from lying on your back to sitting on the side of a flat bed without using bedrails?: A Little Help needed moving to and from a bed to a chair (including a wheelchair)?: A Little Help needed standing up from a chair using your arms (e.g., wheelchair or bedside chair)?: A Little Help needed to walk in hospital room?: A Little Help needed climbing 3-5 steps with a railing? : A Little 6 Click Score: 18    End of Session Equipment Utilized During Treatment: Gait belt Activity Tolerance: Patient tolerated treatment well Patient left: in chair;with call bell/phone within reach;with chair alarm set;with SCD's reapplied;with family/visitor present Nurse Communication: Mobility status;Weight bearing status;Precautions PT Visit Diagnosis: Other abnormalities of gait and mobility (R26.89);Muscle weakness (generalized) (M62.81);Pain Pain - Right/Left: Left Pain - part of body: Hip     Time: [id number] PT Time Calculation (min) (ACUTE ONLY): 26 min  Charges:  $Gait Training: 8-22 mins $Therapeutic Activity: 8-22 mins                     D. Scott Serina Nichter PT, DPT 11/25/22, 10:43 AM

## 2022-11-25 NOTE — TOC Benefit Eligibility Note (Signed)
Patient Advocate Encounter  Insurance verification completed.    The patient is currently admitted and upon discharge could be taking Eliquis 2.5 mg.  The current 30 day co-pay is $47.00.   The patient is insured through [employer] Medicare Part D   Alvilda Mckenna, CPHT Pharmacy Patient Advocate Specialist [facility] Patient Advocate Team Direct Number: [phone]  Fax: [phone]       

## 2022-11-25 NOTE — Progress Notes (Signed)
Physical Therapy Treatment Patient Details Name: Tonya Boyd MRN: [id number] DOB: 1955-12-20 Today's Date: 11/25/2022   History of Present Illness Pt is a 67 yo F diagnosed with degenerative joint disease of the left hip and is s/p elective L THA.  PMH includes CVA, CAD, CKD, gout, HTN, back surgery, COPD, and R TKA.    PT Comments    Pt was pleasant and motivated to participate during the session and put forth good effort throughout. Pt steady with good control during transfer training from multiple height surfaces with cues for proper sequencing.  Pt able to amb 80 feet this session and ascend/descend 4 steps with one rail with good control and stability throughout.  Pt reported no adverse symptoms during the session with SpO2 and HR WNL.  Pt will benefit from HHPT upon discharge to safely address deficits listed in patient problem list for decreased caregiver assistance and eventual return to PLOF.     Recommendations for follow up therapy are one component of a multi-disciplinary discharge planning process, led by the attending physician.  Recommendations may be updated based on patient status, additional functional criteria and insurance authorization.  Follow Up Recommendations  Home health PT     Assistance Recommended at Discharge Intermittent Supervision/Assistance  Patient can return home with the following A little help with walking and/or transfers;Assistance with cooking/housework;A little help with bathing/dressing/bathroom;Help with stairs or ramp for entrance;Assist for transportation   Equipment Recommendations  None recommended by PT    Recommendations for Other Services       Precautions / Restrictions Precautions Precautions: Fall;Posterior Hip Precaution Booklet Issued: Yes (comment) Restrictions Weight Bearing Restrictions: Yes LLE Weight Bearing: Weight bearing as tolerated     Mobility  Bed Mobility Overal bed mobility: Needs Assistance Bed  Mobility: Supine to Sit, Sit to Supine     Supine to sit: Supervision Sit to supine: Min assist   General bed mobility comments: Min extra time and effort with sup to sit and min A for LLE management during sit to sup    Transfers Overall transfer level: Needs assistance Equipment used: Rolling walker (2 wheels) Transfers: Sit to/from Stand Sit to Stand: Supervision           General transfer comment: Min verbal cues for hand and LLE positioning but good control and stability throughout    Ambulation/Gait Ambulation/Gait assistance: Supervision Gait Distance (Feet): 80 Feet Assistive device: Rolling walker (2 wheels) Gait Pattern/deviations: Step-to pattern, Decreased step length - right, Decreased stance time - left, Antalgic Gait velocity: decreased     General Gait Details: Pt continues to self-select step-to pattern for L hip pain control but steady throughout, good carryover with proper sequencing during sharp L turns   Stairs Stairs: Yes Stairs assistance: Min guard Stair Management: One rail Right, Step to pattern, Forwards Number of Stairs: 4 General stair comments: Min verbal and visual cues for sequencing with good eccentric and concentric control throughout, spouse present for training   Wheelchair Mobility    Modified Rankin (Stroke Patients Only)       Balance Overall balance assessment: Needs assistance   Sitting balance-Leahy Scale: Normal     Standing balance support: During functional activity, Bilateral upper extremity supported Standing balance-Leahy Scale: Good                              Cognition Arousal/Alertness: Awake/alert Behavior During Therapy: WFL for tasks assessed/performed Overall Cognitive Status:  Within Functional Limits for tasks assessed                                          Exercises Other Exercises Other Exercises: Car transfer sequencing education with pt and spouse Other  Exercises: 90 deg L turn training/review to prevent CKC hip IR Other Exercises: Transfer training to/from various height surfaces with cues for sequencing with spouse present for training    General Comments        Pertinent Vitals/Pain Pain Assessment Pain Assessment: 0-10 Pain Score: 4  Pain Location: L hip Pain Descriptors / Indicators: Sore Pain Intervention(s): Repositioned, Premedicated before session, Monitored during session, Ice applied    Home Living Family/patient expects to be discharged to:: Private residence Living Arrangements: Spouse/significant other Available Help at Discharge: Family;Available 24 hours/day Type of Home: House Home Access: Stairs to enter Entrance Stairs-Rails: Right Entrance Stairs-Number of Steps: 4   Home Layout: Two level;Able to live on main level with bedroom/bathroom Home Equipment: Rolling Walker (2 wheels);Cane - single point;BSC/3in1;Shower seat;Adaptive equipment      Prior Function            PT Goals (current goals can now be found in the care plan section) Progress towards PT goals: Progressing toward goals    Frequency    BID      PT Plan Current plan remains appropriate    Co-evaluation              AM-PAC PT "6 Clicks" Mobility   Outcome Measure  Help needed turning from your back to your side while in a flat bed without using bedrails?: A Little Help needed moving from lying on your back to sitting on the side of a flat bed without using bedrails?: A Little Help needed moving to and from a bed to a chair (including a wheelchair)?: A Little Help needed standing up from a chair using your arms (e.g., wheelchair or bedside chair)?: A Little Help needed to walk in hospital room?: A Little Help needed climbing 3-5 steps with a railing? : A Little 6 Click Score: 18    End of Session Equipment Utilized During Treatment: Gait belt Activity Tolerance: Patient tolerated treatment well Patient left: in  bed;with call bell/phone within reach;with bed alarm set;with family/visitor present;with nursing/sitter in room Nurse Communication: Mobility status;Weight bearing status;Precautions PT Visit Diagnosis: Other abnormalities of gait and mobility (R26.89);Muscle weakness (generalized) (M62.81);Pain Pain - Right/Left: Left Pain - part of body: Hip     Time: 1400-1446 PT Time Calculation (min) (ACUTE ONLY): 46 min  Charges:  $Gait Training: 23-37 mins $Therapeutic Activity: 8-22 mins                     D. Scott Yachet Mattson PT, DPT 11/25/22, 2:59 PM

## 2022-11-26 DIAGNOSIS — I472 Ventricular tachycardia, unspecified: Secondary | ICD-10-CM | POA: Diagnosis not present

## 2022-11-26 DIAGNOSIS — I25110 Atherosclerotic heart disease of native coronary artery with unstable angina pectoris: Secondary | ICD-10-CM | POA: Diagnosis not present

## 2022-11-26 DIAGNOSIS — Z96642 Presence of left artificial hip joint: Secondary | ICD-10-CM | POA: Diagnosis not present

## 2022-11-26 DIAGNOSIS — E7800 Pure hypercholesterolemia, unspecified: Secondary | ICD-10-CM | POA: Diagnosis not present

## 2022-11-26 DIAGNOSIS — M109 Gout, unspecified: Secondary | ICD-10-CM | POA: Diagnosis not present

## 2022-11-26 DIAGNOSIS — N3010 Interstitial cystitis (chronic) without hematuria: Secondary | ICD-10-CM | POA: Diagnosis not present

## 2022-11-26 DIAGNOSIS — J4552 Severe persistent asthma with status asthmaticus: Secondary | ICD-10-CM | POA: Diagnosis not present

## 2022-11-26 DIAGNOSIS — M7630 Iliotibial band syndrome, unspecified leg: Secondary | ICD-10-CM | POA: Diagnosis not present

## 2022-11-26 DIAGNOSIS — M1990 Unspecified osteoarthritis, unspecified site: Secondary | ICD-10-CM | POA: Diagnosis not present

## 2022-11-26 DIAGNOSIS — I129 Hypertensive chronic kidney disease with stage 1 through stage 4 chronic kidney disease, or unspecified chronic kidney disease: Secondary | ICD-10-CM | POA: Diagnosis not present

## 2022-11-26 DIAGNOSIS — Z9181 History of falling: Secondary | ICD-10-CM | POA: Diagnosis not present

## 2022-11-26 DIAGNOSIS — Z96651 Presence of right artificial knee joint: Secondary | ICD-10-CM | POA: Diagnosis not present

## 2022-11-26 DIAGNOSIS — N189 Chronic kidney disease, unspecified: Secondary | ICD-10-CM | POA: Diagnosis not present

## 2022-11-26 DIAGNOSIS — I7300 Raynaud's syndrome without gangrene: Secondary | ICD-10-CM | POA: Diagnosis not present

## 2022-11-26 DIAGNOSIS — Z87442 Personal history of urinary calculi: Secondary | ICD-10-CM | POA: Diagnosis not present

## 2022-11-26 DIAGNOSIS — Z8673 Personal history of transient ischemic attack (TIA), and cerebral infarction without residual deficits: Secondary | ICD-10-CM | POA: Diagnosis not present

## 2022-11-26 DIAGNOSIS — Z7901 Long term (current) use of anticoagulants: Secondary | ICD-10-CM | POA: Diagnosis not present

## 2022-11-26 DIAGNOSIS — F32A Depression, unspecified: Secondary | ICD-10-CM | POA: Diagnosis not present

## 2022-11-26 DIAGNOSIS — Z471 Aftercare following joint replacement surgery: Secondary | ICD-10-CM | POA: Diagnosis not present

## 2022-11-26 DIAGNOSIS — J4489 Other specified chronic obstructive pulmonary disease: Secondary | ICD-10-CM | POA: Diagnosis not present

## 2022-11-26 LAB — SURGICAL PATHOLOGY

## 2022-11-28 DIAGNOSIS — Z87442 Personal history of urinary calculi: Secondary | ICD-10-CM | POA: Diagnosis not present

## 2022-11-28 DIAGNOSIS — Z9181 History of falling: Secondary | ICD-10-CM | POA: Diagnosis not present

## 2022-11-28 DIAGNOSIS — J4489 Other specified chronic obstructive pulmonary disease: Secondary | ICD-10-CM | POA: Diagnosis not present

## 2022-11-28 DIAGNOSIS — N3010 Interstitial cystitis (chronic) without hematuria: Secondary | ICD-10-CM | POA: Diagnosis not present

## 2022-11-28 DIAGNOSIS — M109 Gout, unspecified: Secondary | ICD-10-CM | POA: Diagnosis not present

## 2022-11-28 DIAGNOSIS — Z471 Aftercare following joint replacement surgery: Secondary | ICD-10-CM | POA: Diagnosis not present

## 2022-11-28 DIAGNOSIS — I7300 Raynaud's syndrome without gangrene: Secondary | ICD-10-CM | POA: Diagnosis not present

## 2022-11-28 DIAGNOSIS — Z7901 Long term (current) use of anticoagulants: Secondary | ICD-10-CM | POA: Diagnosis not present

## 2022-11-28 DIAGNOSIS — I25110 Atherosclerotic heart disease of native coronary artery with unstable angina pectoris: Secondary | ICD-10-CM | POA: Diagnosis not present

## 2022-11-28 DIAGNOSIS — I129 Hypertensive chronic kidney disease with stage 1 through stage 4 chronic kidney disease, or unspecified chronic kidney disease: Secondary | ICD-10-CM | POA: Diagnosis not present

## 2022-11-28 DIAGNOSIS — M1990 Unspecified osteoarthritis, unspecified site: Secondary | ICD-10-CM | POA: Diagnosis not present

## 2022-11-28 DIAGNOSIS — N189 Chronic kidney disease, unspecified: Secondary | ICD-10-CM | POA: Diagnosis not present

## 2022-11-28 DIAGNOSIS — Z96642 Presence of left artificial hip joint: Secondary | ICD-10-CM | POA: Diagnosis not present

## 2022-11-28 DIAGNOSIS — E7800 Pure hypercholesterolemia, unspecified: Secondary | ICD-10-CM | POA: Diagnosis not present

## 2022-11-28 DIAGNOSIS — M7630 Iliotibial band syndrome, unspecified leg: Secondary | ICD-10-CM | POA: Diagnosis not present

## 2022-11-28 DIAGNOSIS — Z8673 Personal history of transient ischemic attack (TIA), and cerebral infarction without residual deficits: Secondary | ICD-10-CM | POA: Diagnosis not present

## 2022-11-28 DIAGNOSIS — I472 Ventricular tachycardia, unspecified: Secondary | ICD-10-CM | POA: Diagnosis not present

## 2022-11-28 DIAGNOSIS — F32A Depression, unspecified: Secondary | ICD-10-CM | POA: Diagnosis not present

## 2022-11-28 DIAGNOSIS — Z96651 Presence of right artificial knee joint: Secondary | ICD-10-CM | POA: Diagnosis not present

## 2022-11-28 DIAGNOSIS — J4552 Severe persistent asthma with status asthmaticus: Secondary | ICD-10-CM | POA: Diagnosis not present

## 2022-12-01 DIAGNOSIS — Z8673 Personal history of transient ischemic attack (TIA), and cerebral infarction without residual deficits: Secondary | ICD-10-CM | POA: Diagnosis not present

## 2022-12-01 DIAGNOSIS — I7300 Raynaud's syndrome without gangrene: Secondary | ICD-10-CM | POA: Diagnosis not present

## 2022-12-01 DIAGNOSIS — Z96642 Presence of left artificial hip joint: Secondary | ICD-10-CM | POA: Diagnosis not present

## 2022-12-01 DIAGNOSIS — J4552 Severe persistent asthma with status asthmaticus: Secondary | ICD-10-CM | POA: Diagnosis not present

## 2022-12-01 DIAGNOSIS — Z96651 Presence of right artificial knee joint: Secondary | ICD-10-CM | POA: Diagnosis not present

## 2022-12-01 DIAGNOSIS — M109 Gout, unspecified: Secondary | ICD-10-CM | POA: Diagnosis not present

## 2022-12-01 DIAGNOSIS — I472 Ventricular tachycardia, unspecified: Secondary | ICD-10-CM | POA: Diagnosis not present

## 2022-12-01 DIAGNOSIS — N189 Chronic kidney disease, unspecified: Secondary | ICD-10-CM | POA: Diagnosis not present

## 2022-12-01 DIAGNOSIS — Z471 Aftercare following joint replacement surgery: Secondary | ICD-10-CM | POA: Diagnosis not present

## 2022-12-01 DIAGNOSIS — I129 Hypertensive chronic kidney disease with stage 1 through stage 4 chronic kidney disease, or unspecified chronic kidney disease: Secondary | ICD-10-CM | POA: Diagnosis not present

## 2022-12-01 DIAGNOSIS — F32A Depression, unspecified: Secondary | ICD-10-CM | POA: Diagnosis not present

## 2022-12-01 DIAGNOSIS — E7800 Pure hypercholesterolemia, unspecified: Secondary | ICD-10-CM | POA: Diagnosis not present

## 2022-12-01 DIAGNOSIS — M1990 Unspecified osteoarthritis, unspecified site: Secondary | ICD-10-CM | POA: Diagnosis not present

## 2022-12-01 DIAGNOSIS — Z9181 History of falling: Secondary | ICD-10-CM | POA: Diagnosis not present

## 2022-12-01 DIAGNOSIS — Z87442 Personal history of urinary calculi: Secondary | ICD-10-CM | POA: Diagnosis not present

## 2022-12-01 DIAGNOSIS — N3010 Interstitial cystitis (chronic) without hematuria: Secondary | ICD-10-CM | POA: Diagnosis not present

## 2022-12-01 DIAGNOSIS — M7630 Iliotibial band syndrome, unspecified leg: Secondary | ICD-10-CM | POA: Diagnosis not present

## 2022-12-01 DIAGNOSIS — Z7901 Long term (current) use of anticoagulants: Secondary | ICD-10-CM | POA: Diagnosis not present

## 2022-12-01 DIAGNOSIS — I25110 Atherosclerotic heart disease of native coronary artery with unstable angina pectoris: Secondary | ICD-10-CM | POA: Diagnosis not present

## 2022-12-01 DIAGNOSIS — J4489 Other specified chronic obstructive pulmonary disease: Secondary | ICD-10-CM | POA: Diagnosis not present

## 2022-12-03 DIAGNOSIS — Z87442 Personal history of urinary calculi: Secondary | ICD-10-CM | POA: Diagnosis not present

## 2022-12-03 DIAGNOSIS — Z96651 Presence of right artificial knee joint: Secondary | ICD-10-CM | POA: Diagnosis not present

## 2022-12-03 DIAGNOSIS — E7800 Pure hypercholesterolemia, unspecified: Secondary | ICD-10-CM | POA: Diagnosis not present

## 2022-12-03 DIAGNOSIS — Z471 Aftercare following joint replacement surgery: Secondary | ICD-10-CM | POA: Diagnosis not present

## 2022-12-03 DIAGNOSIS — I7300 Raynaud's syndrome without gangrene: Secondary | ICD-10-CM | POA: Diagnosis not present

## 2022-12-03 DIAGNOSIS — Z7901 Long term (current) use of anticoagulants: Secondary | ICD-10-CM | POA: Diagnosis not present

## 2022-12-03 DIAGNOSIS — J4552 Severe persistent asthma with status asthmaticus: Secondary | ICD-10-CM | POA: Diagnosis not present

## 2022-12-03 DIAGNOSIS — I129 Hypertensive chronic kidney disease with stage 1 through stage 4 chronic kidney disease, or unspecified chronic kidney disease: Secondary | ICD-10-CM | POA: Diagnosis not present

## 2022-12-03 DIAGNOSIS — N3010 Interstitial cystitis (chronic) without hematuria: Secondary | ICD-10-CM | POA: Diagnosis not present

## 2022-12-03 DIAGNOSIS — M109 Gout, unspecified: Secondary | ICD-10-CM | POA: Diagnosis not present

## 2022-12-03 DIAGNOSIS — J4489 Other specified chronic obstructive pulmonary disease: Secondary | ICD-10-CM | POA: Diagnosis not present

## 2022-12-03 DIAGNOSIS — Z96642 Presence of left artificial hip joint: Secondary | ICD-10-CM | POA: Diagnosis not present

## 2022-12-03 DIAGNOSIS — M1990 Unspecified osteoarthritis, unspecified site: Secondary | ICD-10-CM | POA: Diagnosis not present

## 2022-12-03 DIAGNOSIS — Z8673 Personal history of transient ischemic attack (TIA), and cerebral infarction without residual deficits: Secondary | ICD-10-CM | POA: Diagnosis not present

## 2022-12-03 DIAGNOSIS — I25110 Atherosclerotic heart disease of native coronary artery with unstable angina pectoris: Secondary | ICD-10-CM | POA: Diagnosis not present

## 2022-12-03 DIAGNOSIS — M7630 Iliotibial band syndrome, unspecified leg: Secondary | ICD-10-CM | POA: Diagnosis not present

## 2022-12-03 DIAGNOSIS — Z9181 History of falling: Secondary | ICD-10-CM | POA: Diagnosis not present

## 2022-12-03 DIAGNOSIS — I472 Ventricular tachycardia, unspecified: Secondary | ICD-10-CM | POA: Diagnosis not present

## 2022-12-03 DIAGNOSIS — N189 Chronic kidney disease, unspecified: Secondary | ICD-10-CM | POA: Diagnosis not present

## 2022-12-03 DIAGNOSIS — F32A Depression, unspecified: Secondary | ICD-10-CM | POA: Diagnosis not present

## 2022-12-07 DIAGNOSIS — Z9181 History of falling: Secondary | ICD-10-CM | POA: Diagnosis not present

## 2022-12-07 DIAGNOSIS — J4489 Other specified chronic obstructive pulmonary disease: Secondary | ICD-10-CM | POA: Diagnosis not present

## 2022-12-07 DIAGNOSIS — I25110 Atherosclerotic heart disease of native coronary artery with unstable angina pectoris: Secondary | ICD-10-CM | POA: Diagnosis not present

## 2022-12-07 DIAGNOSIS — N3010 Interstitial cystitis (chronic) without hematuria: Secondary | ICD-10-CM | POA: Diagnosis not present

## 2022-12-07 DIAGNOSIS — I472 Ventricular tachycardia, unspecified: Secondary | ICD-10-CM | POA: Diagnosis not present

## 2022-12-07 DIAGNOSIS — Z7901 Long term (current) use of anticoagulants: Secondary | ICD-10-CM | POA: Diagnosis not present

## 2022-12-07 DIAGNOSIS — Z471 Aftercare following joint replacement surgery: Secondary | ICD-10-CM | POA: Diagnosis not present

## 2022-12-07 DIAGNOSIS — Z96642 Presence of left artificial hip joint: Secondary | ICD-10-CM | POA: Diagnosis not present

## 2022-12-07 DIAGNOSIS — I129 Hypertensive chronic kidney disease with stage 1 through stage 4 chronic kidney disease, or unspecified chronic kidney disease: Secondary | ICD-10-CM | POA: Diagnosis not present

## 2022-12-07 DIAGNOSIS — J4552 Severe persistent asthma with status asthmaticus: Secondary | ICD-10-CM | POA: Diagnosis not present

## 2022-12-07 DIAGNOSIS — M1990 Unspecified osteoarthritis, unspecified site: Secondary | ICD-10-CM | POA: Diagnosis not present

## 2022-12-07 DIAGNOSIS — M7630 Iliotibial band syndrome, unspecified leg: Secondary | ICD-10-CM | POA: Diagnosis not present

## 2022-12-07 DIAGNOSIS — M109 Gout, unspecified: Secondary | ICD-10-CM | POA: Diagnosis not present

## 2022-12-07 DIAGNOSIS — F32A Depression, unspecified: Secondary | ICD-10-CM | POA: Diagnosis not present

## 2022-12-07 DIAGNOSIS — Z87442 Personal history of urinary calculi: Secondary | ICD-10-CM | POA: Diagnosis not present

## 2022-12-07 DIAGNOSIS — Z8673 Personal history of transient ischemic attack (TIA), and cerebral infarction without residual deficits: Secondary | ICD-10-CM | POA: Diagnosis not present

## 2022-12-07 DIAGNOSIS — E7800 Pure hypercholesterolemia, unspecified: Secondary | ICD-10-CM | POA: Diagnosis not present

## 2022-12-07 DIAGNOSIS — I7300 Raynaud's syndrome without gangrene: Secondary | ICD-10-CM | POA: Diagnosis not present

## 2022-12-07 DIAGNOSIS — Z96651 Presence of right artificial knee joint: Secondary | ICD-10-CM | POA: Diagnosis not present

## 2022-12-07 DIAGNOSIS — N189 Chronic kidney disease, unspecified: Secondary | ICD-10-CM | POA: Diagnosis not present

## 2022-12-10 DIAGNOSIS — Z96642 Presence of left artificial hip joint: Secondary | ICD-10-CM | POA: Diagnosis not present

## 2022-12-10 DIAGNOSIS — Z96651 Presence of right artificial knee joint: Secondary | ICD-10-CM | POA: Diagnosis not present

## 2022-12-10 DIAGNOSIS — Z8673 Personal history of transient ischemic attack (TIA), and cerebral infarction without residual deficits: Secondary | ICD-10-CM | POA: Diagnosis not present

## 2022-12-10 DIAGNOSIS — J4489 Other specified chronic obstructive pulmonary disease: Secondary | ICD-10-CM | POA: Diagnosis not present

## 2022-12-10 DIAGNOSIS — Z9181 History of falling: Secondary | ICD-10-CM | POA: Diagnosis not present

## 2022-12-10 DIAGNOSIS — M109 Gout, unspecified: Secondary | ICD-10-CM | POA: Diagnosis not present

## 2022-12-10 DIAGNOSIS — M1990 Unspecified osteoarthritis, unspecified site: Secondary | ICD-10-CM | POA: Diagnosis not present

## 2022-12-10 DIAGNOSIS — I7300 Raynaud's syndrome without gangrene: Secondary | ICD-10-CM | POA: Diagnosis not present

## 2022-12-10 DIAGNOSIS — I129 Hypertensive chronic kidney disease with stage 1 through stage 4 chronic kidney disease, or unspecified chronic kidney disease: Secondary | ICD-10-CM | POA: Diagnosis not present

## 2022-12-10 DIAGNOSIS — Z471 Aftercare following joint replacement surgery: Secondary | ICD-10-CM | POA: Diagnosis not present

## 2022-12-10 DIAGNOSIS — I25110 Atherosclerotic heart disease of native coronary artery with unstable angina pectoris: Secondary | ICD-10-CM | POA: Diagnosis not present

## 2022-12-10 DIAGNOSIS — N3010 Interstitial cystitis (chronic) without hematuria: Secondary | ICD-10-CM | POA: Diagnosis not present

## 2022-12-10 DIAGNOSIS — M7630 Iliotibial band syndrome, unspecified leg: Secondary | ICD-10-CM | POA: Diagnosis not present

## 2022-12-10 DIAGNOSIS — J4552 Severe persistent asthma with status asthmaticus: Secondary | ICD-10-CM | POA: Diagnosis not present

## 2022-12-10 DIAGNOSIS — Z7901 Long term (current) use of anticoagulants: Secondary | ICD-10-CM | POA: Diagnosis not present

## 2022-12-10 DIAGNOSIS — I472 Ventricular tachycardia, unspecified: Secondary | ICD-10-CM | POA: Diagnosis not present

## 2022-12-10 DIAGNOSIS — F32A Depression, unspecified: Secondary | ICD-10-CM | POA: Diagnosis not present

## 2022-12-10 DIAGNOSIS — Z87442 Personal history of urinary calculi: Secondary | ICD-10-CM | POA: Diagnosis not present

## 2022-12-10 DIAGNOSIS — N189 Chronic kidney disease, unspecified: Secondary | ICD-10-CM | POA: Diagnosis not present

## 2022-12-10 DIAGNOSIS — E7800 Pure hypercholesterolemia, unspecified: Secondary | ICD-10-CM | POA: Diagnosis not present

## 2022-12-15 DIAGNOSIS — Z471 Aftercare following joint replacement surgery: Secondary | ICD-10-CM | POA: Diagnosis not present

## 2022-12-15 DIAGNOSIS — I472 Ventricular tachycardia, unspecified: Secondary | ICD-10-CM | POA: Diagnosis not present

## 2022-12-15 DIAGNOSIS — M1990 Unspecified osteoarthritis, unspecified site: Secondary | ICD-10-CM | POA: Diagnosis not present

## 2022-12-15 DIAGNOSIS — I7300 Raynaud's syndrome without gangrene: Secondary | ICD-10-CM | POA: Diagnosis not present

## 2022-12-15 DIAGNOSIS — E7800 Pure hypercholesterolemia, unspecified: Secondary | ICD-10-CM | POA: Diagnosis not present

## 2022-12-15 DIAGNOSIS — J4552 Severe persistent asthma with status asthmaticus: Secondary | ICD-10-CM | POA: Diagnosis not present

## 2022-12-15 DIAGNOSIS — Z87442 Personal history of urinary calculi: Secondary | ICD-10-CM | POA: Diagnosis not present

## 2022-12-15 DIAGNOSIS — I25110 Atherosclerotic heart disease of native coronary artery with unstable angina pectoris: Secondary | ICD-10-CM | POA: Diagnosis not present

## 2022-12-15 DIAGNOSIS — F32A Depression, unspecified: Secondary | ICD-10-CM | POA: Diagnosis not present

## 2022-12-15 DIAGNOSIS — Z96642 Presence of left artificial hip joint: Secondary | ICD-10-CM | POA: Diagnosis not present

## 2022-12-15 DIAGNOSIS — Z96651 Presence of right artificial knee joint: Secondary | ICD-10-CM | POA: Diagnosis not present

## 2022-12-15 DIAGNOSIS — Z8673 Personal history of transient ischemic attack (TIA), and cerebral infarction without residual deficits: Secondary | ICD-10-CM | POA: Diagnosis not present

## 2022-12-15 DIAGNOSIS — M7630 Iliotibial band syndrome, unspecified leg: Secondary | ICD-10-CM | POA: Diagnosis not present

## 2022-12-15 DIAGNOSIS — Z9181 History of falling: Secondary | ICD-10-CM | POA: Diagnosis not present

## 2022-12-15 DIAGNOSIS — M109 Gout, unspecified: Secondary | ICD-10-CM | POA: Diagnosis not present

## 2022-12-15 DIAGNOSIS — N189 Chronic kidney disease, unspecified: Secondary | ICD-10-CM | POA: Diagnosis not present

## 2022-12-15 DIAGNOSIS — Z7901 Long term (current) use of anticoagulants: Secondary | ICD-10-CM | POA: Diagnosis not present

## 2022-12-15 DIAGNOSIS — J4489 Other specified chronic obstructive pulmonary disease: Secondary | ICD-10-CM | POA: Diagnosis not present

## 2022-12-15 DIAGNOSIS — I129 Hypertensive chronic kidney disease with stage 1 through stage 4 chronic kidney disease, or unspecified chronic kidney disease: Secondary | ICD-10-CM | POA: Diagnosis not present

## 2022-12-15 DIAGNOSIS — N3010 Interstitial cystitis (chronic) without hematuria: Secondary | ICD-10-CM | POA: Diagnosis not present

## 2022-12-17 DIAGNOSIS — M7630 Iliotibial band syndrome, unspecified leg: Secondary | ICD-10-CM | POA: Diagnosis not present

## 2022-12-17 DIAGNOSIS — I472 Ventricular tachycardia, unspecified: Secondary | ICD-10-CM | POA: Diagnosis not present

## 2022-12-17 DIAGNOSIS — Z471 Aftercare following joint replacement surgery: Secondary | ICD-10-CM | POA: Diagnosis not present

## 2022-12-17 DIAGNOSIS — F32A Depression, unspecified: Secondary | ICD-10-CM | POA: Diagnosis not present

## 2022-12-17 DIAGNOSIS — Z9181 History of falling: Secondary | ICD-10-CM | POA: Diagnosis not present

## 2022-12-17 DIAGNOSIS — N189 Chronic kidney disease, unspecified: Secondary | ICD-10-CM | POA: Diagnosis not present

## 2022-12-17 DIAGNOSIS — I25110 Atherosclerotic heart disease of native coronary artery with unstable angina pectoris: Secondary | ICD-10-CM | POA: Diagnosis not present

## 2022-12-17 DIAGNOSIS — Z8673 Personal history of transient ischemic attack (TIA), and cerebral infarction without residual deficits: Secondary | ICD-10-CM | POA: Diagnosis not present

## 2022-12-17 DIAGNOSIS — J4489 Other specified chronic obstructive pulmonary disease: Secondary | ICD-10-CM | POA: Diagnosis not present

## 2022-12-17 DIAGNOSIS — M1990 Unspecified osteoarthritis, unspecified site: Secondary | ICD-10-CM | POA: Diagnosis not present

## 2022-12-17 DIAGNOSIS — I7300 Raynaud's syndrome without gangrene: Secondary | ICD-10-CM | POA: Diagnosis not present

## 2022-12-17 DIAGNOSIS — Z7901 Long term (current) use of anticoagulants: Secondary | ICD-10-CM | POA: Diagnosis not present

## 2022-12-17 DIAGNOSIS — J4552 Severe persistent asthma with status asthmaticus: Secondary | ICD-10-CM | POA: Diagnosis not present

## 2022-12-17 DIAGNOSIS — Z96651 Presence of right artificial knee joint: Secondary | ICD-10-CM | POA: Diagnosis not present

## 2022-12-17 DIAGNOSIS — M109 Gout, unspecified: Secondary | ICD-10-CM | POA: Diagnosis not present

## 2022-12-17 DIAGNOSIS — Z96642 Presence of left artificial hip joint: Secondary | ICD-10-CM | POA: Diagnosis not present

## 2022-12-17 DIAGNOSIS — Z87442 Personal history of urinary calculi: Secondary | ICD-10-CM | POA: Diagnosis not present

## 2022-12-17 DIAGNOSIS — N3010 Interstitial cystitis (chronic) without hematuria: Secondary | ICD-10-CM | POA: Diagnosis not present

## 2022-12-17 DIAGNOSIS — E7800 Pure hypercholesterolemia, unspecified: Secondary | ICD-10-CM | POA: Diagnosis not present

## 2022-12-17 DIAGNOSIS — I129 Hypertensive chronic kidney disease with stage 1 through stage 4 chronic kidney disease, or unspecified chronic kidney disease: Secondary | ICD-10-CM | POA: Diagnosis not present

## 2022-12-23 DIAGNOSIS — Z87442 Personal history of urinary calculi: Secondary | ICD-10-CM | POA: Diagnosis not present

## 2022-12-23 DIAGNOSIS — Z8673 Personal history of transient ischemic attack (TIA), and cerebral infarction without residual deficits: Secondary | ICD-10-CM | POA: Diagnosis not present

## 2022-12-23 DIAGNOSIS — M1990 Unspecified osteoarthritis, unspecified site: Secondary | ICD-10-CM | POA: Diagnosis not present

## 2022-12-23 DIAGNOSIS — I129 Hypertensive chronic kidney disease with stage 1 through stage 4 chronic kidney disease, or unspecified chronic kidney disease: Secondary | ICD-10-CM | POA: Diagnosis not present

## 2022-12-23 DIAGNOSIS — Z471 Aftercare following joint replacement surgery: Secondary | ICD-10-CM | POA: Diagnosis not present

## 2022-12-23 DIAGNOSIS — I472 Ventricular tachycardia, unspecified: Secondary | ICD-10-CM | POA: Diagnosis not present

## 2022-12-23 DIAGNOSIS — Z7901 Long term (current) use of anticoagulants: Secondary | ICD-10-CM | POA: Diagnosis not present

## 2022-12-23 DIAGNOSIS — N189 Chronic kidney disease, unspecified: Secondary | ICD-10-CM | POA: Diagnosis not present

## 2022-12-23 DIAGNOSIS — N3010 Interstitial cystitis (chronic) without hematuria: Secondary | ICD-10-CM | POA: Diagnosis not present

## 2022-12-23 DIAGNOSIS — Z96642 Presence of left artificial hip joint: Secondary | ICD-10-CM | POA: Diagnosis not present

## 2022-12-23 DIAGNOSIS — Z96651 Presence of right artificial knee joint: Secondary | ICD-10-CM | POA: Diagnosis not present

## 2022-12-23 DIAGNOSIS — I7300 Raynaud's syndrome without gangrene: Secondary | ICD-10-CM | POA: Diagnosis not present

## 2022-12-23 DIAGNOSIS — J4552 Severe persistent asthma with status asthmaticus: Secondary | ICD-10-CM | POA: Diagnosis not present

## 2022-12-23 DIAGNOSIS — I25110 Atherosclerotic heart disease of native coronary artery with unstable angina pectoris: Secondary | ICD-10-CM | POA: Diagnosis not present

## 2022-12-23 DIAGNOSIS — J4489 Other specified chronic obstructive pulmonary disease: Secondary | ICD-10-CM | POA: Diagnosis not present

## 2022-12-23 DIAGNOSIS — E7800 Pure hypercholesterolemia, unspecified: Secondary | ICD-10-CM | POA: Diagnosis not present

## 2022-12-23 DIAGNOSIS — M109 Gout, unspecified: Secondary | ICD-10-CM | POA: Diagnosis not present

## 2022-12-23 DIAGNOSIS — F32A Depression, unspecified: Secondary | ICD-10-CM | POA: Diagnosis not present

## 2022-12-23 DIAGNOSIS — M7630 Iliotibial band syndrome, unspecified leg: Secondary | ICD-10-CM | POA: Diagnosis not present

## 2022-12-23 DIAGNOSIS — Z9181 History of falling: Secondary | ICD-10-CM | POA: Diagnosis not present

## 2022-12-25 DIAGNOSIS — Z9181 History of falling: Secondary | ICD-10-CM | POA: Diagnosis not present

## 2022-12-25 DIAGNOSIS — Z96651 Presence of right artificial knee joint: Secondary | ICD-10-CM | POA: Diagnosis not present

## 2022-12-25 DIAGNOSIS — M7630 Iliotibial band syndrome, unspecified leg: Secondary | ICD-10-CM | POA: Diagnosis not present

## 2022-12-25 DIAGNOSIS — Z87442 Personal history of urinary calculi: Secondary | ICD-10-CM | POA: Diagnosis not present

## 2022-12-25 DIAGNOSIS — J4489 Other specified chronic obstructive pulmonary disease: Secondary | ICD-10-CM | POA: Diagnosis not present

## 2022-12-25 DIAGNOSIS — Z96642 Presence of left artificial hip joint: Secondary | ICD-10-CM | POA: Diagnosis not present

## 2022-12-25 DIAGNOSIS — M1990 Unspecified osteoarthritis, unspecified site: Secondary | ICD-10-CM | POA: Diagnosis not present

## 2022-12-25 DIAGNOSIS — N3010 Interstitial cystitis (chronic) without hematuria: Secondary | ICD-10-CM | POA: Diagnosis not present

## 2022-12-25 DIAGNOSIS — N189 Chronic kidney disease, unspecified: Secondary | ICD-10-CM | POA: Diagnosis not present

## 2022-12-25 DIAGNOSIS — E7800 Pure hypercholesterolemia, unspecified: Secondary | ICD-10-CM | POA: Diagnosis not present

## 2022-12-25 DIAGNOSIS — Z471 Aftercare following joint replacement surgery: Secondary | ICD-10-CM | POA: Diagnosis not present

## 2022-12-25 DIAGNOSIS — M109 Gout, unspecified: Secondary | ICD-10-CM | POA: Diagnosis not present

## 2022-12-25 DIAGNOSIS — I7300 Raynaud's syndrome without gangrene: Secondary | ICD-10-CM | POA: Diagnosis not present

## 2022-12-25 DIAGNOSIS — I25110 Atherosclerotic heart disease of native coronary artery with unstable angina pectoris: Secondary | ICD-10-CM | POA: Diagnosis not present

## 2022-12-25 DIAGNOSIS — Z7901 Long term (current) use of anticoagulants: Secondary | ICD-10-CM | POA: Diagnosis not present

## 2022-12-25 DIAGNOSIS — J4552 Severe persistent asthma with status asthmaticus: Secondary | ICD-10-CM | POA: Diagnosis not present

## 2022-12-25 DIAGNOSIS — I129 Hypertensive chronic kidney disease with stage 1 through stage 4 chronic kidney disease, or unspecified chronic kidney disease: Secondary | ICD-10-CM | POA: Diagnosis not present

## 2022-12-25 DIAGNOSIS — I472 Ventricular tachycardia, unspecified: Secondary | ICD-10-CM | POA: Diagnosis not present

## 2022-12-25 DIAGNOSIS — Z8673 Personal history of transient ischemic attack (TIA), and cerebral infarction without residual deficits: Secondary | ICD-10-CM | POA: Diagnosis not present

## 2022-12-25 DIAGNOSIS — F32A Depression, unspecified: Secondary | ICD-10-CM | POA: Diagnosis not present

## 2022-12-26 ENCOUNTER — Inpatient Hospital Stay (HOSPITAL_COMMUNITY)
Admission: EM | Admit: 2022-12-26 | Discharge: 2022-12-31 | DRG: 887 | Disposition: A | Discharge status: Skilled Nursing Facility | Payer: Medicare Other | Attending: Neurology | Admitting: Neurology

## 2022-12-26 ENCOUNTER — Emergency Department (HOSPITAL_COMMUNITY): Payer: Medicare Other

## 2022-12-26 ENCOUNTER — Encounter (HOSPITAL_COMMUNITY): Payer: Self-pay | Admitting: Emergency Medicine

## 2022-12-26 ENCOUNTER — Other Ambulatory Visit: Payer: Self-pay

## 2022-12-26 DIAGNOSIS — Z96651 Presence of right artificial knee joint: Secondary | ICD-10-CM | POA: Diagnosis not present

## 2022-12-26 DIAGNOSIS — E7800 Pure hypercholesterolemia, unspecified: Secondary | ICD-10-CM | POA: Diagnosis present

## 2022-12-26 DIAGNOSIS — R2990 Unspecified symptoms and signs involving the nervous system: Secondary | ICD-10-CM | POA: Diagnosis not present

## 2022-12-26 DIAGNOSIS — Z885 Allergy status to narcotic agent status: Secondary | ICD-10-CM

## 2022-12-26 DIAGNOSIS — Z7901 Long term (current) use of anticoagulants: Secondary | ICD-10-CM | POA: Diagnosis not present

## 2022-12-26 DIAGNOSIS — R079 Chest pain, unspecified: Secondary | ICD-10-CM | POA: Diagnosis not present

## 2022-12-26 DIAGNOSIS — Z886 Allergy status to analgesic agent status: Secondary | ICD-10-CM

## 2022-12-26 DIAGNOSIS — I639 Cerebral infarction, unspecified: Secondary | ICD-10-CM | POA: Diagnosis present

## 2022-12-26 DIAGNOSIS — G43109 Migraine with aura, not intractable, without status migrainosus: Secondary | ICD-10-CM | POA: Diagnosis not present

## 2022-12-26 DIAGNOSIS — Z881 Allergy status to other antibiotic agents status: Secondary | ICD-10-CM

## 2022-12-26 DIAGNOSIS — Z825 Family history of asthma and other chronic lower respiratory diseases: Secondary | ICD-10-CM

## 2022-12-26 DIAGNOSIS — Z96642 Presence of left artificial hip joint: Secondary | ICD-10-CM | POA: Diagnosis not present

## 2022-12-26 DIAGNOSIS — R6889 Other general symptoms and signs: Secondary | ICD-10-CM | POA: Diagnosis not present

## 2022-12-26 DIAGNOSIS — G4700 Insomnia, unspecified: Secondary | ICD-10-CM | POA: Diagnosis present

## 2022-12-26 DIAGNOSIS — N189 Chronic kidney disease, unspecified: Secondary | ICD-10-CM | POA: Diagnosis not present

## 2022-12-26 DIAGNOSIS — Z833 Family history of diabetes mellitus: Secondary | ICD-10-CM

## 2022-12-26 DIAGNOSIS — J4489 Other specified chronic obstructive pulmonary disease: Secondary | ICD-10-CM | POA: Diagnosis not present

## 2022-12-26 DIAGNOSIS — F449 Dissociative and conversion disorder, unspecified: Secondary | ICD-10-CM | POA: Diagnosis not present

## 2022-12-26 DIAGNOSIS — R202 Paresthesia of skin: Secondary | ICD-10-CM | POA: Diagnosis not present

## 2022-12-26 DIAGNOSIS — E669 Obesity, unspecified: Secondary | ICD-10-CM | POA: Diagnosis present

## 2022-12-26 DIAGNOSIS — Z8673 Personal history of transient ischemic attack (TIA), and cerebral infarction without residual deficits: Secondary | ICD-10-CM

## 2022-12-26 DIAGNOSIS — G8929 Other chronic pain: Secondary | ICD-10-CM | POA: Diagnosis present

## 2022-12-26 DIAGNOSIS — G4709 Other insomnia: Secondary | ICD-10-CM | POA: Diagnosis not present

## 2022-12-26 DIAGNOSIS — I10 Essential (primary) hypertension: Secondary | ICD-10-CM | POA: Diagnosis not present

## 2022-12-26 DIAGNOSIS — Z6837 Body mass index (BMI) 37.0-37.9, adult: Secondary | ICD-10-CM | POA: Diagnosis not present

## 2022-12-26 DIAGNOSIS — R29810 Facial weakness: Secondary | ICD-10-CM | POA: Diagnosis not present

## 2022-12-26 DIAGNOSIS — Z8249 Family history of ischemic heart disease and other diseases of the circulatory system: Secondary | ICD-10-CM

## 2022-12-26 DIAGNOSIS — K219 Gastro-esophageal reflux disease without esophagitis: Secondary | ICD-10-CM | POA: Diagnosis not present

## 2022-12-26 DIAGNOSIS — J449 Chronic obstructive pulmonary disease, unspecified: Secondary | ICD-10-CM | POA: Diagnosis present

## 2022-12-26 DIAGNOSIS — M6281 Muscle weakness (generalized): Secondary | ICD-10-CM | POA: Diagnosis not present

## 2022-12-26 DIAGNOSIS — R0789 Other chest pain: Secondary | ICD-10-CM | POA: Diagnosis not present

## 2022-12-26 DIAGNOSIS — I69320 Aphasia following cerebral infarction: Secondary | ICD-10-CM | POA: Diagnosis not present

## 2022-12-26 DIAGNOSIS — Z743 Need for continuous supervision: Secondary | ICD-10-CM | POA: Diagnosis not present

## 2022-12-26 DIAGNOSIS — Z91048 Other nonmedicinal substance allergy status: Secondary | ICD-10-CM | POA: Diagnosis not present

## 2022-12-26 DIAGNOSIS — R4781 Slurred speech: Principal | ICD-10-CM

## 2022-12-26 DIAGNOSIS — I69311 Memory deficit following cerebral infarction: Secondary | ICD-10-CM | POA: Diagnosis not present

## 2022-12-26 DIAGNOSIS — R29818 Other symptoms and signs involving the nervous system: Secondary | ICD-10-CM | POA: Diagnosis not present

## 2022-12-26 DIAGNOSIS — F4489 Other dissociative and conversion disorders: Secondary | ICD-10-CM | POA: Diagnosis present

## 2022-12-26 DIAGNOSIS — I7300 Raynaud's syndrome without gangrene: Secondary | ICD-10-CM | POA: Diagnosis not present

## 2022-12-26 DIAGNOSIS — I509 Heart failure, unspecified: Secondary | ICD-10-CM | POA: Diagnosis not present

## 2022-12-26 DIAGNOSIS — I2510 Atherosclerotic heart disease of native coronary artery without angina pectoris: Secondary | ICD-10-CM | POA: Diagnosis not present

## 2022-12-26 DIAGNOSIS — Z7401 Bed confinement status: Secondary | ICD-10-CM | POA: Diagnosis not present

## 2022-12-26 DIAGNOSIS — E785 Hyperlipidemia, unspecified: Secondary | ICD-10-CM | POA: Diagnosis present

## 2022-12-26 DIAGNOSIS — Z841 Family history of disorders of kidney and ureter: Secondary | ICD-10-CM

## 2022-12-26 DIAGNOSIS — Z888 Allergy status to other drugs, medicaments and biological substances status: Secondary | ICD-10-CM | POA: Diagnosis not present

## 2022-12-26 DIAGNOSIS — G8191 Hemiplegia, unspecified affecting right dominant side: Secondary | ICD-10-CM | POA: Diagnosis present

## 2022-12-26 DIAGNOSIS — I6389 Other cerebral infarction: Secondary | ICD-10-CM | POA: Diagnosis not present

## 2022-12-26 DIAGNOSIS — Z79899 Other long term (current) drug therapy: Secondary | ICD-10-CM | POA: Diagnosis not present

## 2022-12-26 DIAGNOSIS — Z800 Family history of malignant neoplasm of digestive organs: Secondary | ICD-10-CM

## 2022-12-26 DIAGNOSIS — Z955 Presence of coronary angioplasty implant and graft: Secondary | ICD-10-CM | POA: Diagnosis not present

## 2022-12-26 DIAGNOSIS — M109 Gout, unspecified: Secondary | ICD-10-CM | POA: Diagnosis present

## 2022-12-26 DIAGNOSIS — R279 Unspecified lack of coordination: Secondary | ICD-10-CM | POA: Diagnosis not present

## 2022-12-26 DIAGNOSIS — R531 Weakness: Secondary | ICD-10-CM | POA: Diagnosis not present

## 2022-12-26 LAB — I-STAT CHEM 8, ED
BUN: 26 mg/dL — ABNORMAL HIGH (ref 8–23)
Calcium, Ion: 1.15 mmol/L (ref 1.15–1.40)
Chloride: 100 mmol/L (ref 98–111)
Creatinine, Ser: 1.1 mg/dL — ABNORMAL HIGH (ref 0.44–1.00)
Glucose, Bld: 105 mg/dL — ABNORMAL HIGH (ref 70–99)
HCT: 39 % (ref 36.0–46.0)
Hemoglobin: 13.3 g/dL (ref 12.0–15.0)
Potassium: 4.6 mmol/L (ref 3.5–5.1)
Sodium: 139 mmol/L (ref 135–145)
TCO2: 30 mmol/L (ref 22–32)

## 2022-12-26 LAB — URINALYSIS, ROUTINE W REFLEX MICROSCOPIC
Bacteria, UA: NONE SEEN
Bilirubin Urine: NEGATIVE
Glucose, UA: NEGATIVE mg/dL
Ketones, ur: NEGATIVE mg/dL
Leukocytes,Ua: NEGATIVE
Nitrite: NEGATIVE
Protein, ur: NEGATIVE mg/dL
Specific Gravity, Urine: 1.016 (ref 1.005–1.030)
pH: 6 (ref 5.0–8.0)

## 2022-12-26 LAB — HEMOGLOBIN A1C
Hgb A1c MFr Bld: 5.2 % (ref 4.8–5.6)
Mean Plasma Glucose: 102.54 mg/dL

## 2022-12-26 LAB — DIFFERENTIAL
Abs Immature Granulocytes: 0.02 10*3/uL (ref 0.00–0.07)
Basophils Absolute: 0.1 10*3/uL (ref 0.0–0.1)
Basophils Relative: 1 %
Eosinophils Absolute: 0.5 10*3/uL (ref 0.0–0.5)
Eosinophils Relative: 7 %
Immature Granulocytes: 0 %
Lymphocytes Relative: 23 %
Lymphs Abs: 1.6 10*3/uL (ref 0.7–4.0)
Monocytes Absolute: 0.6 10*3/uL (ref 0.1–1.0)
Monocytes Relative: 9 %
Neutro Abs: 4 10*3/uL (ref 1.7–7.7)
Neutrophils Relative %: 60 %

## 2022-12-26 LAB — RAPID URINE DRUG SCREEN, HOSP PERFORMED
Amphetamines: NOT DETECTED
Barbiturates: NOT DETECTED
Benzodiazepines: NOT DETECTED
Cocaine: NOT DETECTED
Opiates: NOT DETECTED
Tetrahydrocannabinol: NOT DETECTED

## 2022-12-26 LAB — COMPREHENSIVE METABOLIC PANEL
ALT: 19 U/L (ref 0–44)
AST: 22 U/L (ref 15–41)
Albumin: 3.8 g/dL (ref 3.5–5.0)
Alkaline Phosphatase: 118 U/L (ref 38–126)
Anion gap: 9 (ref 5–15)
BUN: 22 mg/dL (ref 8–23)
CO2: 27 mmol/L (ref 22–32)
Calcium: 8.9 mg/dL (ref 8.9–10.3)
Chloride: 101 mmol/L (ref 98–111)
Creatinine, Ser: 1.08 mg/dL — ABNORMAL HIGH (ref 0.44–1.00)
GFR, Estimated: 57 mL/min — ABNORMAL LOW (ref >60)
Glucose, Bld: 107 mg/dL — ABNORMAL HIGH (ref 70–99)
Potassium: 4.5 mmol/L (ref 3.5–5.1)
Sodium: 137 mmol/L (ref 135–145)
Total Bilirubin: 0.4 mg/dL (ref 0.3–1.2)
Total Protein: 7.6 g/dL (ref 6.5–8.1)

## 2022-12-26 LAB — CBC
HCT: 38 % (ref 36.0–46.0)
Hemoglobin: 12.4 g/dL (ref 12.0–15.0)
MCH: 32.1 pg (ref 26.0–34.0)
MCHC: 32.6 g/dL (ref 30.0–36.0)
MCV: 98.4 fL (ref 80.0–100.0)
Platelets: 333 10*3/uL (ref 150–400)
RBC: 3.86 MIL/uL — ABNORMAL LOW (ref 3.87–5.11)
RDW: 13.8 % (ref 11.5–15.5)
WBC: 6.9 10*3/uL (ref 4.0–10.5)
nRBC: 0 % (ref 0.0–0.2)

## 2022-12-26 LAB — ETHANOL: Alcohol, Ethyl (B): <10 mg/dL (ref <10)

## 2022-12-26 LAB — TROPONIN I (HIGH SENSITIVITY)
Troponin I (High Sensitivity): <2 ng/L (ref <18)
Troponin I (High Sensitivity): 3 ng/L (ref <18)

## 2022-12-26 LAB — MRSA NEXT GEN BY PCR, NASAL: MRSA by PCR Next Gen: NOT DETECTED

## 2022-12-26 LAB — VALPROIC ACID LEVEL: Valproic Acid Lvl: 29 ug/mL — ABNORMAL LOW (ref 50.0–100.0)

## 2022-12-26 LAB — PROTIME-INR
INR: 0.9 (ref 0.8–1.2)
Prothrombin Time: 12.5 seconds (ref 11.4–15.2)

## 2022-12-26 LAB — APTT: aPTT: 34 seconds (ref 24–36)

## 2022-12-26 MED ORDER — SENNOSIDES-DOCUSATE SODIUM 8.6-50 MG PO TABS
1.0000 | ORAL_TABLET | Freq: Every evening | ORAL | Status: DC | PRN
  Administered 2022-12-30: 1 via ORAL
  Filled 2022-12-26: qty 1

## 2022-12-26 MED ORDER — TENECTEPLASE FOR STROKE: 0.2500 mg/kg | PACK | Freq: Once | INTRAVENOUS | Status: AC

## 2022-12-26 MED ORDER — LABETALOL HCL 5 MG/ML IV SOLN: 10.0000 mg | INTRAVENOUS | Status: DC | PRN

## 2022-12-26 MED ORDER — TRAZODONE HCL 100 MG PO TABS
100.0000 mg | ORAL_TABLET | Freq: Every day | ORAL | Status: DC
  Administered 2022-12-26 – 2022-12-30 (×5): 100 mg via ORAL
  Filled 2022-12-26 (×2): qty 2
  Filled 2022-12-26 (×2): qty 1
  Filled 2022-12-26: qty 2
  Filled 2022-12-26: qty 1

## 2022-12-26 MED ORDER — TIZANIDINE HCL 4 MG PO TABS
4.0000 mg | ORAL_TABLET | Freq: Three times a day (TID) | ORAL | Status: DC | PRN
  Administered 2022-12-27 (×3): 4 mg via ORAL
  Filled 2022-12-26 (×5): qty 1

## 2022-12-26 MED ORDER — IOHEXOL 350 MG/ML SOLN
75.0000 mL | Freq: Once | INTRAVENOUS | Status: AC | PRN
  Administered 2022-12-26: 75 mL via INTRAVENOUS

## 2022-12-26 MED ORDER — ORAL CARE MOUTH RINSE: 15.0000 mL | OROMUCOSAL | Status: DC | PRN

## 2022-12-26 MED ORDER — SODIUM CHLORIDE 0.9 % IV SOLN: INTRAVENOUS | Status: DC

## 2022-12-26 MED ORDER — PANTOPRAZOLE SODIUM 40 MG IV SOLR
40.0000 mg | Freq: Every day | INTRAVENOUS | Status: DC
  Administered 2022-12-26: 40 mg via INTRAVENOUS
  Filled 2022-12-26: qty 10

## 2022-12-26 MED ORDER — STROKE: EARLY STAGES OF RECOVERY BOOK
Freq: Once | Status: AC
  Filled 2022-12-26: qty 1

## 2022-12-26 MED ORDER — TENECTEPLASE FOR STROKE
PACK | INTRAVENOUS | Status: AC
  Administered 2022-12-26: 25 mg via INTRAVENOUS
  Filled 2022-12-26: qty 10

## 2022-12-26 MED ORDER — OXYCODONE HCL 5 MG PO TABS
5.0000 mg | ORAL_TABLET | Freq: Once | ORAL | Status: AC
  Administered 2022-12-26: 5 mg via ORAL
  Filled 2022-12-26: qty 1

## 2022-12-26 MED ORDER — CYANOCOBALAMIN 1000 MCG/ML IJ SOLN
1000.0000 ug | INTRAMUSCULAR | Status: DC
  Administered 2022-12-27: 1000 ug via SUBCUTANEOUS
  Filled 2022-12-26: qty 1

## 2022-12-26 MED ORDER — TRAMADOL HCL 50 MG PO TABS
50.0000 mg | ORAL_TABLET | Freq: Four times a day (QID) | ORAL | Status: DC | PRN
  Administered 2022-12-27 – 2022-12-30 (×7): 50 mg via ORAL
  Filled 2022-12-26 (×7): qty 1

## 2022-12-26 MED ORDER — DIVALPROEX SODIUM ER 250 MG PO TB24
250.0000 mg | ORAL_TABLET | Freq: Every morning | ORAL | Status: DC
  Administered 2022-12-27: 250 mg via ORAL
  Filled 2022-12-26 (×2): qty 1

## 2022-12-26 MED ORDER — ACETAMINOPHEN 650 MG RE SUPP: 650.0000 mg | RECTAL | Status: DC | PRN

## 2022-12-26 MED ORDER — ACETAMINOPHEN 325 MG PO TABS
650.0000 mg | ORAL_TABLET | ORAL | Status: DC | PRN
  Administered 2022-12-27 – 2022-12-28 (×3): 650 mg via ORAL
  Filled 2022-12-26 (×3): qty 2

## 2022-12-26 MED ORDER — CHLORHEXIDINE GLUCONATE CLOTH 2 % EX PADS
6.0000 | MEDICATED_PAD | Freq: Every day | CUTANEOUS | Status: DC
  Administered 2022-12-27 – 2022-12-28 (×2): 6 via TOPICAL

## 2022-12-26 MED ORDER — HYDROXYZINE HCL 25 MG PO TABS: 25.0000 mg | ORAL_TABLET | Freq: Four times a day (QID) | ORAL | Status: DC | PRN

## 2022-12-26 MED ORDER — ACETAMINOPHEN 160 MG/5ML PO SOLN: 650.0000 mg | ORAL | Status: DC | PRN

## 2022-12-26 MED ORDER — OXYCODONE HCL 5 MG PO TABS
5.0000 mg | ORAL_TABLET | Freq: Three times a day (TID) | ORAL | Status: DC | PRN
  Administered 2022-12-26 – 2022-12-31 (×8): 5 mg via ORAL
  Filled 2022-12-26 (×8): qty 1

## 2022-12-26 MED ORDER — CHLORHEXIDINE GLUCONATE CLOTH 2 % EX PADS
6.0000 | MEDICATED_PAD | Freq: Every day | CUTANEOUS | Status: DC
  Administered 2022-12-26: 6 via TOPICAL

## 2022-12-26 NOTE — ED Notes (Signed)
[facility] at bedside 

## 2022-12-26 NOTE — H&P (Addendum)
Stroke Neurology Admission History & Physical   CC: Tx to [facility] after TNK given at [facility]  History is obtained from:patient, husband and chart review.  HPI: Tonya Boyd is a 67 y.o. female with PMHx as listed below who presents as a transfer from [facility] after presenting with right-sided deficits and speech abnormality with acute onset while she was home alone.  She called EMS herself.  She was given TNK at 1245 today for presenting NIH stroke scale of 12. LKN was 10:00 AM.   She tells me she has history of stroke in 2020.  Per chart review she was noted to have symptoms of a stroke in September 2020 after waking with brain fog and right facial abnormality.  She was seen by her primary care provider who ordered a brain MRI.  MRI was negative for stroke and was later repeated and again negative.  She has chronic headaches.  She is on Depakote for this.  Other past medical history includes left hip replacement approximately 1 month ago, right knee replacement, hypertension, GERD, hyperlipidemia, asthma, and CAD.  Premorbid modified Rankin scale (mRS): 0  ROS: Full ROS was performed and is negative except as noted in the HPI.   Past Medical History:  Diagnosis Date   Angina pectoris    Anxiety    Asthma    CAD (coronary artery disease), native coronary artery 08/02/2018   a.) [facility] 08/02/2018: EF 45%, super-dominant large RCA, 80% p-mRCA, 50% oRPDA, 80% D1; mPA 13, mPCWP 8, PA sat 81%, Ao sat 99, CO 6.95, CI 3.65 --> 4.0 x 30 mm Orsiro DES x1 to [facility]; b.) [facility] 10/07/2018: 50-60% oD1 (FFR 0.96), diffuse HG stenosis prox seg very small (<1 mm) RI --> med mgmt; c.) [facility] 04/08/2021: 80% pRCA (4.0 x 16 mm Synergy XD DES) and 80% oPDA (2.5 x 12 mm Synergy XD DES)   Chronic bronchitis    Chronic kidney disease    Complication of anesthesia    a.) postoperative nausea only (no vomiting); also "shakes"; per patient relieved w/ IV diphenhydramine   COPD (chronic  obstructive pulmonary disease)    Depression    Diastolic dysfunction 07/23/2018   a.) TTE 07/23/2018: EF 55-60%, GLS -19.6%, G1DD; b.) TTE 11/01/2020: EF 55%, triv AR, G1DD   Family history of adverse reaction to anesthesia    "cousin stopped breathing; he was allergic to the anesthesia" (08/02/2018)   GERD (gastroesophageal reflux disease)    High cholesterol    History of gout    History of hiatal hernia    History of kidney stones    Hypertension 07/2018   Interstitial cystitis    Long term current use of antithrombotics/antiplatelets    a.) daily DAPT therapy (ASA + clopidogrel)   Migraines    NSVT (nonsustained ventricular tachycardia) 07/2018   a.) holter 07/13/2018: 6 beat run NSVT; maximum rate 133 bpm.   OA (osteoarthritis)    PONV (postoperative nausea and vomiting)    Raynaud's disease    Stroke-like episode 06/08/2019   a.) speech difficulty, right sided weakness/paraesthesias; unclear etiology ?? possibly conversion disorder. MRI brain x 2 negative for stroke.     Family History  Problem Relation Age of Onset   Kidney failure Mother    Diabetes Mother    Heart disease Father    Heart attack Father    Colon cancer Brother    Liver cancer Brother    Heart disease Other    Arthritis  Other    Cancer Other    Asthma Other    Diabetes Other    Kidney disease Other      Social History:   reports that she has never smoked. She has never used smokeless tobacco. She reports that she does not currently use alcohol. She reports that she does not use drugs.  Medications  Current Facility-Administered Medications:    [START ON 12/27/2022]  stroke: early stages of recovery book, , Does not apply, Once, Caryl Pina, MD   0.9 %  sodium chloride infusion, , Intravenous, Continuous, Caryl Pina, MD, Last Rate: 75 mL/hr at 12/26/22 1700, Infusion Verify at 12/26/22 1700   acetaminophen (TYLENOL) tablet 650 mg, 650 mg, Oral, Q4H PRN **OR** acetaminophen (TYLENOL) 160  MG/5ML solution 650 mg, 650 mg, Per Tube, Q4H PRN **OR** acetaminophen (TYLENOL) suppository 650 mg, 650 mg, Rectal, Q4H PRN, Caryl Pina, MD   Melene Muller ON 12/27/2022] Chlorhexidine Gluconate Cloth 2 % PADS 6 each, 6 each, Topical, Q0600, Rejeana Brock, MD, 6 each at 12/26/22 1500   labetalol (NORMODYNE) injection 10 mg, 10 mg, Intravenous, Q10 min PRN, Wynell Balloon, NP   Oral care mouth rinse, 15 mL, Mouth Rinse, PRN, Rejeana Brock, MD   pantoprazole (PROTONIX) injection 40 mg, 40 mg, Intravenous, QHS, Caryl Pina, MD   senna-docusate (Senokot-S) tablet 1 tablet, 1 tablet, Oral, QHS PRN, Caryl Pina, MD   Exam: Current vital signs: BP 128/73   Pulse 86   Temp 97.9 F (36.6 C) (Oral)   Resp (!) 24   Ht 5\' 4"  (1.626 m)   Wt 99.7 kg   SpO2 97%   BMI 37.71 kg/m  Vital signs in last 24 hours: Temp:  [97.9 F (36.6 C)-98.5 F (36.9 C)] 97.9 F (36.6 C) (04/20 1500) Pulse Rate:  [81-96] 86 (04/20 1700) Resp:  [11-24] 24 (04/20 1700) BP: (128-172)/(67-98) 128/73 (04/20 1700) SpO2:  [96 %-100 %] 97 % (04/20 1700) Weight:  [99.7 kg] 99.7 kg (04/20 1223)  GENERAL: Awake, alert in NAD HEENT: - Normocephalic and atraumatic LUNGS - equal chest rise; normal WOB CV - S1S2 RRR, occasional PVCs on monitor. Ext: warm, well perfused  NEURO:  Mental Status: AA&Ox4 Language: Speech is abnormal.  Speech is somewhat childlike in nature, waxing and waning.  In speech assessment patient is able to say most things normally with the exception of "fifty fifty".  Her speech pattern also transitions to normal at times during long bouts of conversation.  Naming and comprehension intact. Cranial Nerves: PERRL; EOMI; visual fields full, no facial asymmetry, patient reports abnormal facial sensation with decreased sensory to R face including splitting the forehead, hearing intact, tongue/uvula/soft palate midline, No evidence of tongue atrophy or fasciculations; tongue  midline. Motor: 5/5 strength throughout with the exception of pain in LLE from recent hip surgery limiting her ROM and exam. Tone: is normal and bulk is normal Sensation- Notes diminished sensation to RUE/ RLE compared to left side. Gait- deferred  NIHSS 1700 1a Level of Conscious.: 0 1b LOC Questions: 0 1c LOC Commands: 0 2 Best Gaze: 0 3 Visual: 0 4 Facial Palsy: 0 5a Motor Arm - left: 0 5b Motor Arm - Right: 0 6a Motor Leg - Left: 0 6b Motor Leg - Right: 0 7 Limb Ataxia: 0 8 Sensory: 2 9 Best Language: 0 10 Dysarthria: 1 11 Extinct. and Inatten.: 0 TOTAL: 3   Labs I have reviewed labs in epic and the results pertinent to this consultation  are:  CBC    Component Value Date/Time   WBC 6.9 12/26/2022 1212   RBC 3.86 (L) 12/26/2022 1212   HGB 13.3 12/26/2022 1222   HGB 13.3 08/21/2022 0807   HCT 39.0 12/26/2022 1222   HCT 40.6 08/21/2022 0807   PLT 333 12/26/2022 1212   PLT 392 08/21/2022 0807   MCV 98.4 12/26/2022 1212   MCV 93 08/21/2022 0807   MCH 32.1 12/26/2022 1212   MCHC 32.6 12/26/2022 1212   RDW 13.8 12/26/2022 1212   RDW 12.4 08/21/2022 0807   LYMPHSABS 1.6 12/26/2022 1212   LYMPHSABS 1.6 04/03/2021 1117   MONOABS 0.6 12/26/2022 1212   EOSABS 0.5 12/26/2022 1212   EOSABS 0.2 04/03/2021 1117   BASOSABS 0.1 12/26/2022 1212   BASOSABS 0.1 04/03/2021 1117    CMP     Component Value Date/Time   NA 139 12/26/2022 1222   NA 138 08/21/2022 0807   K 4.6 12/26/2022 1222   CL 100 12/26/2022 1222   CO2 27 12/26/2022 1212   GLUCOSE 105 (H) 12/26/2022 1222   BUN 26 (H) 12/26/2022 1222   BUN 21 08/21/2022 0807   CREATININE 1.10 (H) 12/26/2022 1222   CALCIUM 8.9 12/26/2022 1212   PROT 7.6 12/26/2022 1212   ALBUMIN 3.8 12/26/2022 1212   AST 22 12/26/2022 1212   ALT 19 12/26/2022 1212   ALKPHOS 118 12/26/2022 1212   BILITOT 0.4 12/26/2022 1212   GFRNONAA 57 (L) 12/26/2022 1212   GFRAA >60 06/13/2019 1430    Lipid Panel     Component Value  Date/Time   CHOL 141 01/05/2020 0816   TRIG 86 01/05/2020 0816   HDL 61 01/05/2020 0816   LDLCALC 64 01/05/2020 0816     Imaging I have reviewed the studies below:  CTA h/n 12/26/22 IMPRESSION: 1. Mild tortuosity of the cervical internal carotid arteries bilaterally without focal stenosis. This is nonspecific, but can be seen in the setting of chronic hypertension. 2. No significant stenosis in the neck. 3. Normal variant CTA Circle of Willis without significant proximal stenosis, aneurysm, or branch vessel occlusion. 4. Cervical fusion at C5-6 and C6-7. Adjacent level disease is present with endplate sclerotic changes at C4-5.    Cth 12/26/22 No acute intracranial abnormality Aspects 10/10  MRI examination of the brain pending  Assessment: 67 year old female presenting as a transfer from outside hospital with presenting NIH stroke scale 12 and having received TNK. Past medical history of strokelike symptoms in 2020 with no evidence of ischemic stroke on MRI brain on 06/13/2019 as well as 06/08/2019. Mild chronic small vessel ischemic disease noted on MRIs. - On examination, speech is somewhat childlike in nature, waxing and waning. Patient is able to say most things normally with the exception of "fifty fifty".  Her speech pattern also transitions to normal at times during long bouts of conversation.  Naming and comprehension intact. Subjective mild sensory loss on the right also noted. No motor weakness with the exception of limited LLE movement due to hip pain - CTA without LVO - CT head with no acute abnormality - Suspect possible conversion disorder, although a small acute ischemic stroke is also on the DDx.  - She has several vascular risk factors based on review of her PMHx.   Impression: Presentation of left MCA territory ischemic stroke symptoms status post TNK.  Recommendations: -Close monitoring after TNK per stroke protocol.  She has been admitted to the neuro ICU post  tenecteplase care. -Labetalol IV push prn  for systolic blood pressure greater than 180/105.  Initially listed as an allergy but patient is not aware of how it affected her in the chart list GI upset and therefore benefit is felt to outweigh risk. She has a listed allergy to CCBs as well, but labetalol is felt to be a safer option.  -Continuous cardiac monitoring to assess for arrhythmia -MRI of the brain without contrast -Hemoglobin A1c drawn and 5.2 -Lipid panel ordered -Echocardiogram ordered -PT/OT/speech therapy consult/evaluation and recommendations.  She has already passed a bedside swallow study and is eating a diet at this time. -Due to her recent left hip surgery I will make oxycodone available for severe pain and Tylenol will be available for mild or moderate pain.  She has also been utilizing tizanidine, which we can consider adding if necessary at later time. - No antiplatelet medications or anticoagulants for at least 24 hours.  - She was on Eliquis after her hip replacement which she stopped 3 weeks ago due to bleeding.  - Has not been on ASA or Plavix - If there is no hemorrhagic conversion on repeat CT obtained 24 hours after TNK, start ASA and Plavix, continue DAPT for 3 weeks and then discontinue Plavix, continuing ASA indefinitely thereafter.   - Continue home Depakote for headache prevention.   Patient seen by NP and MD.  Plan of care was reviewed with MD and he agrees.   Wynell Balloon [facility]  I have seen and examined the patient. I have reviewed the assessment and plan and made amendations as needed. 67 year old female presenting as a transfer from outside hospital with presenting NIH stroke scale 12 and having received TNK. Examination with functional attributes as noted above. Suspect possible conversion disorder, although a small acute ischemic stroke is also on the DDx. Plan as documented above.  Electronically signed: Dr. Caryl Pina

## 2022-12-26 NOTE — Plan of Care (Signed)
Overnight cross coverage note Called by patient's RN regarding her pain meds, and medications for sleep.  She is status post hip surgery about a month or so ago. Resuming tizanidine as needed for muscle spasms, tramadol as needed for pain and trazodone at night for sleep. Stroke team to perform complete med rec in the morning on rounds after pharmacy med rec is completed  -- Milon Dikes, MD Neurologist [facility] Pager: [phone]

## 2022-12-26 NOTE — Consult Note (Signed)
[facility] Telemedicine Consult   Requesting Provider: Gardner Candle Consult Participants: Patient Location of the provider: [facility] Location of the patient: [facility]  This consult was provided via telemedicine with 2-way video and audio communication. The patient/family was informed that care would be provided in this way and agreed to receive care in this manner.    Chief Complaint: Right sided weakeness   HPI: Patient presents with right sided weakness and speech difficulty that started on awakening from a nap earlier this morning.  She laid down at 10 AM for a nap and when she awoke, she had right-sided weakness and difficulty speaking.  She presented via EMS and a code stroke was activated.     LKW: 10 AM tpa given?:  Yes IR Thrombectomy? No, no LVO Modified Rankin Scale: 2-Slight disability-UNABLE to perform all activities but does not need assistance Time of teleneurologist evaluation: 12:19  Exam: There were no vitals filed for this visit.  General: In bed, NAD  1A: Level of Consciousness - 0 1B: Ask Month and Age - 2 1C: 'Blink Eyes' & 'Squeeze Hands' - 0 2: Test Horizontal Extraocular Movements - 0 3: Test Visual Fields - 1 4: Test Facial Palsy - 0 5A: Test Left Arm Motor Drift - 0 5B: Test Right Arm Motor Drift - 2 6A: Test Left Leg Motor Drift - 3 6B: Test Right Leg Motor Drift - 3 7: Test Limb Ataxia - 0 8: Test Sensation - 0 9: Test Language/Aphasia- 1 10: Test Dysarthria - 0 11: Test Extinction/Inattention - 0 NIHSS score: 12   Imaging Reviewed: CT - negative CTA - negative.   Labs reviewed in epic and pertinent values follow: Results for orders placed or performed during the hospital encounter of 12/26/22 (from the past 24 hour(s))  Ethanol     Status: None   Collection Time: 12/26/22 12:12 PM  Result Value Ref Range   Alcohol, Ethyl (B) <10 <10 mg/dL  Protime-INR     Status: None   Collection Time:  12/26/22 12:12 PM  Result Value Ref Range   Prothrombin Time 12.5 11.4 - 15.2 seconds   INR 0.9 0.8 - 1.2  APTT     Status: None   Collection Time: 12/26/22 12:12 PM  Result Value Ref Range   aPTT 34 24 - 36 seconds  CBC     Status: Abnormal   Collection Time: 12/26/22 12:12 PM  Result Value Ref Range   WBC 6.9 4.0 - 10.5 K/uL   RBC 3.86 (L) 3.87 - 5.11 MIL/uL   Hemoglobin 12.4 12.0 - 15.0 g/dL   HCT 16.1 09.6 - 04.5 %   MCV 98.4 80.0 - 100.0 fL   MCH 32.1 26.0 - 34.0 pg   MCHC 32.6 30.0 - 36.0 g/dL   RDW 40.9 81.1 - 91.4 %   Platelets 333 150 - 400 K/uL   nRBC 0.0 0.0 - 0.2 %  Differential     Status: None   Collection Time: 12/26/22 12:12 PM  Result Value Ref Range   Neutrophils Relative % 60 %   Neutro Abs 4.0 1.7 - 7.7 K/uL   Lymphocytes Relative 23 %   Lymphs Abs 1.6 0.7 - 4.0 K/uL   Monocytes Relative 9 %   Monocytes Absolute 0.6 0.1 - 1.0 K/uL   Eosinophils Relative 7 %   Eosinophils Absolute 0.5 0.0 - 0.5 K/uL   Basophils Relative 1 %   Basophils Absolute 0.1 0.0 - 0.1  K/uL   Immature Granulocytes 0 %   Abs Immature Granulocytes 0.02 0.00 - 0.07 K/uL  Comprehensive metabolic panel     Status: Abnormal   Collection Time: 12/26/22 12:12 PM  Result Value Ref Range   Sodium 137 135 - 145 mmol/L   Potassium 4.5 3.5 - 5.1 mmol/L   Chloride 101 98 - 111 mmol/L   CO2 27 22 - 32 mmol/L   Glucose, Bld 107 (H) 70 - 99 mg/dL   BUN 22 8 - 23 mg/dL   Creatinine, Ser 1.61 (H) 0.44 - 1.00 mg/dL   Calcium 8.9 8.9 - 09.6 mg/dL   Total Protein 7.6 6.5 - 8.1 g/dL   Albumin 3.8 3.5 - 5.0 g/dL   AST 22 15 - 41 U/L   ALT 19 0 - 44 U/L   Alkaline Phosphatase 118 38 - 126 U/L   Total Bilirubin 0.4 0.3 - 1.2 mg/dL   GFR, Estimated 57 (L) >60 mL/min   Anion gap 9 5 - 15  Troponin I (High Sensitivity)     Status: None   Collection Time: 12/26/22 12:12 PM  Result Value Ref Range   Troponin I (High Sensitivity) <2 <18 ng/L  I-stat chem 8, ED     Status: Abnormal   Collection  Time: 12/26/22 12:22 PM  Result Value Ref Range   Sodium 139 135 - 145 mmol/L   Potassium 4.6 3.5 - 5.1 mmol/L   Chloride 100 98 - 111 mmol/L   BUN 26 (H) 8 - 23 mg/dL   Creatinine, Ser 0.45 (H) 0.44 - 1.00 mg/dL   Glucose, Bld 409 (H) 70 - 99 mg/dL   Calcium, Ion 8.11 9.14 - 1.40 mmol/L   TCO2 30 22 - 32 mmol/L   Hemoglobin 13.3 12.0 - 15.0 g/dL   HCT 78.2 95.6 - 21.3 %      Assessment: 67 year old female who presents with right-sided weakness, speech difficulty, right visual field deficit who is within the tenecteplase window and has no contraindications to IV thrombolytics.  I attempted to discuss risks, benefits and alternatives with the patient, however she indicated that she did not want to hear her options and wanted me to discuss it with her husband and therefore I did by phone.  I discussed risks, benefits, and alternatives with her husband who indicated that he wished to proceed with IV tenecteplase.  Given that this is a disabling symptom, I did favor proceeding, however, there is some possibility that there could be some embellishment  Recommendations:  - HgbA1c, fasting lipid panel - MRI of the brain without contrast - Frequent neuro checks - Echocardiogram - Prophylactic therapy-none for 24-hour - Risk factor modification - Telemetry monitoring - PT consult, OT consult, Speech consult -Maintain BP less than 180/105 -Admit to neuro ICU at [facility] for post tenecteplase care   This patient is critically ill and at significant risk of neurological worsening, death and care requires constant monitoring of vital signs, hemodynamics,respiratory and cardiac monitoring, neurological assessment, discussion with family, other specialists and medical decision making of high complexity. I spent 40 minutes of neurocritical care time  in the care of  this patient. This was time spent independent of any time provided by nurse practitioner or PA.  Ritta Slot, MD [facility] [phone]  If 7pm- 7am, please page neurology on call as listed in [facility]. 12/26/2022  12:59 PM   Ritta Slot, MD [facility] [phone]  If 7pm- 7am, please page neurology on call as listed  in [place].

## 2022-12-26 NOTE — Progress Notes (Signed)
Code Stroke time documentation  1206 Call time 1218 Exam Started 1223 Exam finished 1225 Exam completed in [facility] [address] [facility] called

## 2022-12-26 NOTE — Progress Notes (Signed)
Code stroke activated @ 1205.  To CT @ 1215.  Dr. Amada Jupiter on camera at 1219 in CT. Return from CT @ 1234.     Josiah Lobo BSN, Occupational hygienist

## 2022-12-26 NOTE — ED Provider Notes (Signed)
[place] EMERGENCY DEPARTMENT AT [facility] Provider Note   CSN: [id number] Arrival date & time: 12/26/22  1211     History  No chief complaint on file.   Tonya Boyd is a 67 y.o. female.  She is presenting by ambulance for stroke activation.  Last known well may be 10 AM.  She said she woke from a nap with difficulty speaking and numbness on her right side.  She also endorsed some chest pain.  She does have a headache.  She is on Eliquis.  The history is provided by the patient and the EMS personnel.  Cerebrovascular Accident This is a recurrent problem. The current episode started 3 to 5 hours ago. The problem has not changed since onset.Associated symptoms include chest pain and headaches. Pertinent negatives include no abdominal pain and no shortness of breath. Exacerbated by: speaking. Nothing relieves the symptoms. She has tried nothing for the symptoms. The treatment provided no relief.       Home Medications Prior to Admission medications   Medication Sig Start Date End Date Taking? Authorizing Provider  acebutolol (SECTRAL) 200 MG capsule Take 1 capsule (200 mg total) by mouth 2 (two) times daily. 07/21/22   [name], MD  acetaminophen (TYLENOL) 500 MG tablet Take 2 tablets (1,000 mg total) by mouth every 6 (six) hours. 11/25/22   Anson Oregon, PA-C  apixaban (ELIQUIS) 2.5 MG TABS tablet Take 1 tablet (2.5 mg total) by mouth 2 (two) times daily. 11/25/22   Anson Oregon, PA-C  cyanocobalamin (,VITAMIN B-12,) 1000 MCG/ML injection Inject 1,000 mcg into the skin every 30 (thirty) days. 01/31/21   [provider]  cyclobenzaprine (FLEXERIL) 10 MG tablet Take 10 mg by mouth at bedtime.    [provider]  divalproex (DEPAKOTE ER) 250 MG 24 hr tablet Take 1 tablet (250 mg total) by mouth daily. Patient taking differently: Take 250 mg by mouth every morning. 06/30/22   Ihor Austin, NP  Evolocumab (REPATHA SURECLICK) 140 MG/ML SOAJ  INJECT 140 MG INTO THE SKIN EVERY 2 WEEKS. 10/02/22   [name], MD  hydrOXYzine (ATARAX/VISTARIL) 25 MG tablet Take 25 mg by mouth 4 (four) times daily as needed for itching. 10/30/19   [provider]  isosorbide mononitrate (IMDUR) 30 MG 24 hr tablet Take 1 tablet (30 mg total) by mouth daily. 10/22/22   [name], MD  loratadine (CLARITIN) 10 MG tablet Take 10 mg by mouth at bedtime.    [provider]  losartan (COZAAR) 25 MG tablet Take 1 tablet (25 mg total) by mouth every evening. 10/22/22 10/17/23  [name], MD  nitroGLYCERIN (NITROSTAT) 0.4 MG SL tablet DISSOLVE 1 TABLET SUBLINGUALLY AS NEEDED FOR CHEST PAIN, MAY REPEAT EVERY 5 MINUTES. AFTER 3 CALL 911. 09/03/22   [name], MD  ondansetron (ZOFRAN) 4 MG tablet Take 1 tablet (4 mg total) by mouth every 6 (six) hours as needed for nausea. 11/25/22   Anson Oregon, PA-C  oxyCODONE (OXY IR/ROXICODONE) 5 MG immediate release tablet Take 1-2 tablets (5-10 mg total) by mouth every 4 (four) hours as needed for severe pain. 11/25/22   Anson Oregon, PA-C  pantoprazole (PROTONIX) 20 MG tablet Take 1 tablet (20 mg total) by mouth daily. 07/21/22   [name], MD  SYMBICORT 80-4.5 MCG/ACT inhaler Inhale 2 puffs into the lungs daily as needed (Coughing). 08/10/22   [provider]  traMADol (ULTRAM) 50 MG tablet Take 1 tablet (50 mg total)  by mouth every 6 (six) hours as needed (Breakthrough pain). 11/25/22   Anson Oregon, PA-C  traZODone (DESYREL) 50 MG tablet Take 100 mg by mouth at bedtime. 07/15/21   [provider]  Vitamin D, Ergocalciferol, (DRISDOL) 1.25 MG (50000 UT) CAPS capsule Take 50,000 Units by mouth every Monday.    [provider]      Allergies    Adhesive [tape], Brilinta [ticagrelor], Statins, Carvedilol, Diovan [valsartan], Hydrochlorothiazide, Ibuprofen, Keflex [cephalexin], Morphine and related, Norvasc [amlodipine besylate], Nsaids, Prednisone, Reglan [metoclopramide],  Singulair [montelukast], and Temazepam    Review of Systems   Review of Systems  Respiratory:  Negative for shortness of breath.   Cardiovascular:  Positive for chest pain.  Gastrointestinal:  Negative for abdominal pain.  Neurological:  Positive for headaches.    Physical Exam Updated Vital Signs BP (!) 151/67   Pulse 83   Temp 97.9 F (36.6 C) (Oral)   Resp 17   Ht 5\' 4"  (1.626 m)   Wt 99.7 kg   SpO2 97%   BMI 37.71 kg/m  Physical Exam Vitals and nursing note reviewed.  Constitutional:      General: She is not in acute distress.    Appearance: Normal appearance. She is well-developed.  HENT:     Head: Normocephalic and atraumatic.  Eyes:     Conjunctiva/sclera: Conjunctivae normal.  Cardiovascular:     Rate and Rhythm: Normal rate and regular rhythm.     Heart sounds: No murmur heard. Pulmonary:     Effort: Pulmonary effort is normal. No respiratory distress.     Breath sounds: Normal breath sounds.  Abdominal:     Palpations: Abdomen is soft.     Tenderness: There is no abdominal tenderness.  Musculoskeletal:        General: No swelling.     Cervical back: Neck supple.  Skin:    General: Skin is warm and dry.     Capillary Refill: Capillary refill takes less than 2 seconds.  Neurological:     Mental Status: She is alert.     Cranial Nerves: Cranial nerve deficit present.     Sensory: Sensory deficit present.     Motor: No weakness.     Comments: Patient has some stuttering baby like speech.  Subjective decrease sensation right arm right leg.  Patient also states she is weak on that side although she does have some weakness on exam she is also independently reviewed using her arms and legs without any difficulty.     ED Results / Procedures / Treatments   Labs (all labs ordered are listed, but only abnormal results are displayed) Labs Reviewed  CBC - Abnormal; Notable for the following components:      Result Value   RBC 3.86 (*)    All other components  within normal limits  COMPREHENSIVE METABOLIC PANEL - Abnormal; Notable for the following components:   Glucose, Bld 107 (*)    Creatinine, Ser 1.08 (*)    GFR, Estimated 57 (*)    All other components within normal limits  URINALYSIS, ROUTINE W REFLEX MICROSCOPIC - Abnormal; Notable for the following components:   Color, Urine STRAW (*)    Hgb urine dipstick SMALL (*)    All other components within normal limits  VALPROIC ACID LEVEL - Abnormal; Notable for the following components:   Valproic Acid Lvl 29 (*)    All other components within normal limits  I-STAT CHEM 8, ED - Abnormal; Notable for the  following components:   BUN 26 (*)    Creatinine, Ser 1.10 (*)    Glucose, Bld 105 (*)    All other components within normal limits  MRSA NEXT GEN BY PCR, NASAL  ETHANOL  PROTIME-INR  APTT  DIFFERENTIAL  RAPID URINE DRUG SCREEN, HOSP PERFORMED  HEMOGLOBIN A1C  TROPONIN I (HIGH SENSITIVITY)  TROPONIN I (HIGH SENSITIVITY)    EKG EKG Interpretation  Date/Time:  Saturday December 26 2022 12:47:11 EDT Ventricular Rate:  93 PR Interval:  141 QRS Duration: 100 QT Interval:  357 QTC Calculation: 444 R Axis:   -9 Text Interpretation: Sinus rhythm Low voltage, extremity and precordial leads Nonspecific T abnrm, anterolateral leads No significant change since prior 3/24 Confirmed by Meridee Score [phone]) on 12/26/2022 1:58:57 PM  Radiology CT ANGIO HEAD NECK W WO CM (CODE STROKE)  Result Date: 12/26/2022 CLINICAL DATA:  Right-sided weakness and facial droop. Speech difficulty. Last known well at 10 o'clock a.m. EXAM: CT ANGIOGRAPHY HEAD AND NECK WITH AND WITHOUT CONTRAST TECHNIQUE: Multidetector CT imaging of the head and neck was performed using the standard protocol during bolus administration of intravenous contrast. Multiplanar CT image reconstructions and MIPs were obtained to evaluate the vascular anatomy. Carotid stenosis measurements (when applicable) are obtained utilizing NASCET  criteria, using the distal internal carotid diameter as the denominator. RADIATION DOSE REDUCTION: This exam was performed according to the departmental dose-optimization program which includes automated exposure control, adjustment of the mA and/or kV according to patient size and/or use of iterative reconstruction technique. CONTRAST:  75mL OMNIPAQUE IOHEXOL 350 MG/ML SOLN COMPARISON:  CT head without contrast 12/26/2022. MRA head 06/13/2019 FINDINGS: CTA NECK FINDINGS Aortic arch: A 4 vessel arch configuration is present. The left vertebral artery Para March struck the from the arch, a normal variant. No significant atherosclerotic changes are present. No stenosis or aneurysm is present. Right carotid system: The right common carotid artery is within normal limits. Bifurcation is unremarkable. Mild tortuosity is present cervical right ICA without focal stenosis. Left carotid system: The left common carotid artery is within normal limits. The bifurcation is unremarkable. Mild tortuosity is present cervical left ICA without focal stenosis. Vertebral arteries: The right vertebral artery chin its from the subclavian artery without focal stenosis. The vertebral arteries are codominant. No significant stenosis is present in either vertebral artery in the neck. Skeleton: Cervical fusion noted at C5-6 and C6-7. Adjacent level disease is present with endplate sclerotic changes at C4-5. Alignment is anatomic. No focal osseous lesions are present. Other neck: Soft tissues the neck are otherwise unremarkable. Salivary glands are within normal limits. Thyroid is normal. No significant adenopathy is present. No focal mucosal or submucosal lesions are present. Upper chest: The lung apices are clear. Thoracic inlet is within normal limits. Review of the MIP images confirms the above findings CTA HEAD FINDINGS Anterior circulation: The internal carotid artery is within normal limits from the skull base through the ICA termini. The  A1 and M1 segments are normal. The anterior communicating artery is patent. The MCA bifurcations are normal bilaterally. ACA and MCA branch vessels are within normal limits bilaterally. No aneurysm is present. Posterior circulation: The vertebral arteries are codominant. PICA origins are visualized and normal. The vertebrobasilar junction and basilar artery are normal. The superior cerebellar arteries are within normal limits. Both posterior cerebral arteries originate from basilar tip. The PCA branch vessels are within normal limits bilaterally. Venous sinuses: The dural sinuses are patent. The straight sinus and deep cerebral veins are intact.  Cortical veins are within normal limits. No significant vascular malformation is evident. Anatomic variants: None Review of the MIP images confirms the above findings IMPRESSION: 1. Mild tortuosity of the cervical internal carotid arteries bilaterally without focal stenosis. This is nonspecific, but can be seen in the setting of chronic hypertension. 2. No significant stenosis in the neck. 3. Normal variant CTA Circle of Willis without significant proximal stenosis, aneurysm, or branch vessel occlusion. 4. Cervical fusion at C5-6 and C6-7. Adjacent level disease is present with endplate sclerotic changes at C4-5. Electronically Signed   By: Marin Roberts M.D.   On: 12/26/2022 12:57   CT HEAD CODE STROKE WO CONTRAST  Result Date: 12/26/2022 CLINICAL DATA:  Code stroke. Neuro deficit, acute, stroke suspected. Aphasia. Right-sided weakness and facial droop. EXAM: CT HEAD WITHOUT CONTRAST TECHNIQUE: Contiguous axial images were obtained from the base of the skull through the vertex without intravenous contrast. RADIATION DOSE REDUCTION: This exam was performed according to the departmental dose-optimization program which includes automated exposure control, adjustment of the mA and/or kV according to patient size and/or use of iterative reconstruction technique.  COMPARISON:  MR head without contrast 06/13/2019. CT head without contrast/1/20. FINDINGS: Brain: No acute infarct, hemorrhage, or mass lesion is present. Mild white matter changes are again noted. Deep brain nuclei are within normal limits. The ventricles are of normal size. No significant extraaxial fluid collection is present. The brainstem and cerebellum are within normal limits. Midline structures are within normal limits. Vascular: Minimal atherosclerotic changes are present within the cavernous internal carotid arteries. Hyperdense vessel is present. Skull: Calvarium is intact. No focal lytic or blastic lesions are present. No significant extracranial soft tissue lesion is present. Sinuses/Orbits: The paranasal sinuses and mastoid air cells are clear. Bilateral lens replacements are noted. Globes and orbits are otherwise unremarkable. ASPECTS [facility] Stroke Program Early CT Score) - Ganglionic level infarction (caudate, lentiform nuclei, internal capsule, insula, M1-M3 cortex): 7/7 - Supraganglionic infarction (M4-M6 cortex): 3/3 Total score (0-10 with 10 being normal): 10/10 IMPRESSION: 1. No acute intracranial abnormality or significant interval change. 2. Stable mild white matter disease. This likely reflects the sequela of chronic microvascular ischemia. 3. Aspects is 10/10. These results were called by telephone at the time of interpretation on 12/26/2022 at 12:32 pm to provider [facility] , who verbally acknowledged these results. Electronically Signed   By: Marin Roberts M.D.   On: 12/26/2022 12:33    Procedures .Critical Care  Performed by: Terrilee Files, MD Authorized by: Terrilee Files, MD   Critical care provider statement:    Critical care time (minutes):  45   Critical care time was exclusive of:  Separately billable procedures and treating other patients   Critical care was necessary to treat or prevent imminent or life-threatening deterioration of the following  conditions:  CNS failure or compromise   Critical care was time spent personally by me on the following activities:  Development of treatment plan with patient or surrogate, discussions with consultants, evaluation of patient's response to treatment, examination of patient, obtaining history from patient or surrogate, ordering and performing treatments and interventions, ordering and review of laboratory studies, ordering and review of radiographic studies, pulse oximetry, re-evaluation of patient's condition and review of old charts   I assumed direction of critical care for this patient from another provider in my specialty: no       Medications Ordered in ED Medications - No data to display  ED Course/ Medical Decision Making/ A&P  Clinical Course as of 12/26/22 1650  Sat Dec 26, 2022  1234 Received a call from radiology that they do not see any acute findings.  Awaiting neurology input. [MB]  1245 Patient is evaluated by teleneurology Dr. Amada Jupiter and he is recommending proceeding with TNK.  She will ultimately need transfer for admission to [facility] for further neurologic evaluation ICU admission, MRI.  MRI is not available on this campus at this time. [MB]  1334 Patient having worsening of her chronic hip pain and asking for some of her oxycodone for pain.  Asked nurse to do swallow screening on patient to make sure she is appropriate to take p.o. [MB]    Clinical Course User Index [MB] Terrilee Files, MD                             Medical Decision Making Amount and/or Complexity of Data Reviewed Labs: ordered. Radiology: ordered.  Risk Prescription drug management. Decision regarding hospitalization.   This patient complains of possible strokelike symptoms with garbled speech right arm weakness numbness; this involves an extensive number of treatment Options and is a complaint that carries with it a high risk of complications and morbidity. The differential includes  stroke, hypoglycemia, metabolic derangement, intoxication, anxiety, migraine  I ordered, reviewed and interpreted labs, which included CBC with normal white count normal hemoglobin, chemistries with mildly elevated renal function normal LFTs, Depakote level subtherapeutic, alcohol negative, urinalysis without signs of infection I ordered imaging studies which included CT head angio head and neck and I independently    visualized and interpreted imaging which showed no acute findings Additional history obtained from EMS Previous records obtained and reviewed in epic including prior neurologic work I consulted Dr. Amada Jupiter neurohospitalist and discussed lab and imaging findings and discussed disposition.  Cardiac monitoring reviewed, sinus rhythm Social determinants considered, no significant barriers Critical Interventions: Rapid evaluation and initiation of stroke protocol involving neurology and bedside evaluation  After the interventions stated above, I reevaluated the patient and found patient still to have inconsistent deficits Admission and further testing considered, she will be admitted down to [facility] campus for further neurologic workup         Final Clinical Impression(s) / ED Diagnoses Final diagnoses:  Slurred speech  Paresthesias    Rx / DC Orders ED Discharge Orders     None         Terrilee Files, MD 12/26/22 1654

## 2022-12-26 NOTE — ED Triage Notes (Signed)
Pt got up at 10 am and felt fine. Pt did go back to sleep at 11 am and woke up at 1145am with c/o right side numbness , slurred speech, couldn't walk or see on right side. Pt uses cane at home to ambulate and has a hx of stroke.  REMS called Stroke in field.

## 2022-12-26 NOTE — ED Notes (Signed)
CBG 124 on arrival

## 2022-12-26 NOTE — Plan of Care (Signed)
  Problem: Education: Goal: Knowledge of disease or condition will improve Outcome: Progressing Goal: Knowledge of secondary prevention will improve (MUST DOCUMENT ALL) Outcome: Progressing Goal: Knowledge of patient specific risk factors will improve (Mark N/A or DELETE if not current risk factor) Outcome: Progressing   Problem: Ischemic Stroke/TIA Tissue Perfusion: Goal: Complications of ischemic stroke/TIA will be minimized Outcome: Progressing   Problem: Coping: Goal: Will verbalize positive feelings about self Outcome: Progressing Goal: Will identify appropriate support needs Outcome: Progressing   Problem: Health Behavior/Discharge Planning: Goal: Ability to manage health-related needs will improve Outcome: Progressing Goal: Goals will be collaboratively established with patient/family Outcome: Progressing   Problem: Self-Care: Goal: Ability to participate in self-care as condition permits will improve Outcome: Progressing Goal: Verbalization of feelings and concerns over difficulty with self-care will improve Outcome: Progressing Goal: Ability to communicate needs accurately will improve Outcome: Progressing   Problem: Nutrition: Goal: Risk of aspiration will decrease Outcome: Progressing Goal: Dietary intake will improve Outcome: Progressing   

## 2022-12-26 NOTE — ED Notes (Addendum)
Patients CBG is 124. Under NT badge number [id number] [facility])  for manual override due to not being registered at the time of glucose check upon arrival.

## 2022-12-27 ENCOUNTER — Inpatient Hospital Stay (HOSPITAL_COMMUNITY): Payer: Medicare Other

## 2022-12-27 DIAGNOSIS — I639 Cerebral infarction, unspecified: Secondary | ICD-10-CM | POA: Diagnosis not present

## 2022-12-27 LAB — LIPID PANEL
Cholesterol: 125 mg/dL (ref 0–200)
HDL: 49 mg/dL (ref >40)
LDL Cholesterol: 50 mg/dL (ref 0–99)
Total CHOL/HDL Ratio: 2.6 RATIO
Triglycerides: 131 mg/dL (ref <150)
VLDL: 26 mg/dL (ref 0–40)

## 2022-12-27 MED ORDER — ASPIRIN 81 MG PO CHEW
81.0000 mg | CHEWABLE_TABLET | Freq: Every day | ORAL | Status: DC
  Administered 2022-12-27 – 2022-12-31 (×5): 81 mg via ORAL
  Filled 2022-12-27 (×5): qty 1

## 2022-12-27 MED ORDER — PANTOPRAZOLE SODIUM 40 MG PO TBEC
40.0000 mg | DELAYED_RELEASE_TABLET | Freq: Every day | ORAL | Status: DC
  Administered 2022-12-27 – 2022-12-30 (×4): 40 mg via ORAL
  Filled 2022-12-27 (×4): qty 1

## 2022-12-27 NOTE — Progress Notes (Signed)
PT Cancellation Note  Patient Details Name: Tonya Boyd MRN: [id number] DOB: [birth year]   Cancelled Treatment:    Reason Eval/Treat Not Completed: Active bedrest order, received TNK yesterday. Will watch for bedrest to be removed for PT to evaluate.   Lyanne Co, PT  Acute Rehab Services Secure chat preferred Office [phone]    Elyse Hsu 12/27/2022, 7:19 AM

## 2022-12-27 NOTE — Evaluation (Signed)
Occupational Therapy Evaluation Patient Details Name: Tonya Boyd MRN: [id number] DOB: 1956-08-28 Today's Date: 12/27/2022   History of Present Illness Pt is 67 yo female who presented to [facility] on 12/26/22 with R sided weakness and speech abnormalities, received TNK at 1245. PMH: CVA, CAD, CKD, asth,a. COPD, HTN, gout, Raynauds, recent L THA   Clinical Impression   Patient admitted for the diagnosis above.  PTA she was at home post L THA one moth prior.  At home she used a [facility] in the home, and used a [employer] in the community.  Patient does have a shower chair, but stated she had gotten to the point of not using in.  She did use a [employer] Reacher and sock aid for lower body ADL.  Currently she is needing +2 Min A for basic transfers, and up to Max A for lower body ADL.  L residual hip pain, incoordination and R hemi weakness are the deficits.  OT is indicated to address deficits, and Patient will benefit from intensive inpatient follow up therapy, >3 hours/day.       Recommendations for follow up therapy are one component of a multi-disciplinary discharge planning process, led by the attending physician.  Recommendations may be updated based on patient status, additional functional criteria and insurance authorization.   Assistance Recommended at Discharge Frequent or constant Supervision/Assistance  Patient can return home with the following A lot of help with walking and/or transfers;A lot of help with bathing/dressing/bathroom;Assistance with cooking/housework;Help with stairs or ramp for entrance;Assist for transportation    Functional Status Assessment  Patient has had a recent decline in their functional status and demonstrates the ability to make significant improvements in function in a reasonable and predictable amount of time.  Equipment Recommendations  None recommended by OT    Recommendations for Other Services       Precautions / Restrictions Precautions Precautions:  Fall Precaution Comments: L posterior hip precautons, watch BP, R knee buckle Restrictions Weight Bearing Restrictions: No      Mobility Bed Mobility Overal bed mobility: Needs Assistance Bed Mobility: Supine to Sit     Supine to sit: Supervision, HOB elevated, Min guard          Transfers Overall transfer level: Needs assistance Equipment used: Rolling walker (2 wheels) Transfers: Sit to/from Stand, Bed to chair/wheelchair/BSC Sit to Stand: Max assist     Step pivot transfers: Min assist, +2 physical assistance            Balance Overall balance assessment: Needs assistance Sitting-balance support: Feet supported, Single extremity supported Sitting balance-Leahy Scale: Fair     Standing balance support: Reliant on assistive device for balance Standing balance-Leahy Scale: Poor                             ADL either performed or assessed with clinical judgement   ADL Overall ADL's : Needs assistance/impaired Eating/Feeding: Set up;Sitting   Grooming: Oral care;Minimal assistance;Sitting   Upper Body Bathing: Minimal assistance;Sitting   Lower Body Bathing: Maximal assistance;Sitting/lateral leans   Upper Body Dressing : Moderate assistance;Sitting   Lower Body Dressing: Maximal assistance;Sitting/lateral leans   Toilet Transfer: Minimal assistance;Rolling walker (2 wheels);+2 for physical assistance;+2 for safety/equipment   Toileting- Clothing Manipulation and Hygiene: Total assistance;Sit to/from stand               Vision Baseline Vision/History: 1 Wears glasses Patient Visual Report: No change from baseline  Perception     Praxis      Pertinent Vitals/Pain Pain Assessment Pain Assessment: Faces Faces Pain Scale: Hurts little more Pain Location: L groin with hip AROM Pain Descriptors / Indicators: Aching, Tender Pain Intervention(s): Monitored during session     Hand Dominance Left   Extremity/Trunk Assessment  Upper Extremity Assessment Upper Extremity Assessment: RUE deficits/detail RUE Deficits / Details: Able to move through AROM, but slowed and purposeful.  Decreased coordination.  Overshoots/undershoots.  Arm feel sheavy with decreased sensation to hand RUE Sensation: decreased light touch;decreased proprioception RUE Coordination: decreased fine motor;decreased gross motor   Lower Extremity Assessment Lower Extremity Assessment: Defer to PT evaluation   Cervical / Trunk Assessment Cervical / Trunk Assessment: Kyphotic   Communication Communication Communication: Expressive difficulties   Cognition Arousal/Alertness: Awake/alert Behavior During Therapy: WFL for tasks assessed/performed, Impulsive Overall Cognitive Status: Within Functional Limits for tasks assessed                                       General Comments   Watch BP    Exercises     Shoulder Instructions      Home Living Family/patient expects to be discharged to:: Private residence Living Arrangements: Spouse/significant other;Other (Comment) (SIL flying in from [place] to help) Available Help at Discharge: Family;Available 24 hours/day Type of Home: House Home Access: Stairs to enter [employer] of Steps: 4 Entrance Stairs-Rails: Right Home Layout: Two level;Able to live on main level with bedroom/bathroom     Bathroom Shower/Tub: Chief Strategy Officer: Standard Bathroom Accessibility: Yes How Accessible: Accessible via walker Home Equipment: Rolling Walker (2 wheels);Cane - single point;BSC/3in1;Shower seat;Adaptive equipment Adaptive Equipment: Reacher;Sock aid        Prior Functioning/Environment Prior Level of Function : Independent/Modified Independent             Mobility Comments: Using 4WRW in the community and SPC in the home ADLs Comments: Ind with ADLs/IADLs, does not drive        OT Problem List: Decreased strength;Decreased range of  motion;Decreased activity tolerance;Impaired balance (sitting and/or standing);Impaired UE functional use;Pain      OT Treatment/Interventions: Self-care/ADL training;Therapeutic activities;Therapeutic exercise;Patient/family education;Balance training;DME and/or AE instruction    OT Goals(Current goals can be found in the care plan section) Acute Rehab OT Goals Patient Stated Goal: Walk better OT Goal Formulation: With patient Time For Goal Achievement: 01/08/23 Potential to Achieve Goals: Good ADL Goals Pt Will Perform Grooming: with supervision;standing Pt Will Perform Upper Body Bathing: with set-up;sitting Pt Will Perform Lower Body Bathing: with min assist;with adaptive equipment;sit to/from stand Pt Will Perform Upper Body Dressing: with set-up;sitting Pt Will Perform Lower Body Dressing: with min assist;sit to/from stand;with adaptive equipment Pt Will Transfer to Toilet: with min assist;stand pivot transfer;bedside commode  OT Frequency: Min 2X/week    Co-evaluation PT/OT/SLP Co-Evaluation/Treatment: Yes Reason for Co-Treatment: Complexity of the patient's impairments (multi-system involvement);For patient/therapist safety   OT goals addressed during session: ADL's and self-care      AM-PAC OT "6 Clicks" Daily Activity     Outcome Measure Help from another person eating meals?: A Little Help from another person taking care of personal grooming?: A Little Help from another person toileting, which includes using toliet, bedpan, or urinal?: A Lot Help from another person bathing (including washing, rinsing, drying)?: A Lot Help from another person to put on and taking off regular  upper body clothing?: A Lot Help from another person to put on and taking off regular lower body clothing?: A Lot 6 Click Score: 14   End of Session Equipment Utilized During Treatment: Gait belt;Rolling walker (2 wheels) Nurse Communication: Mobility status  Activity Tolerance: Patient tolerated  treatment well Patient left: in chair;with call bell/phone within reach;with chair alarm set  OT Visit Diagnosis: Unsteadiness on feet (R26.81);Muscle weakness (generalized) (M62.81);Pain;Hemiplegia and hemiparesis Hemiplegia - Right/Left: Right Hemiplegia - dominant/non-dominant: Non-Dominant Hemiplegia - caused by: Other cerebrovascular disease Pain - Right/Left: Left Pain - part of body: Hip                Time: 1135-1210 OT Time Calculation (min): 35 min Charges:  OT General Charges $OT Visit: 1 Visit OT Evaluation $OT Eval Moderate Complexity: 1 Mod  12/27/2022  RP, OTR/L  Acute Rehabilitation Services  Office:  [phone]   Suzanna Obey 12/27/2022, 12:48 PM

## 2022-12-27 NOTE — Progress Notes (Addendum)
STROKE TEAM PROGRESS NOTE   INTERVAL HISTORY Her husband is at the bedside.  Patient is awake alert sitting in the bed in no apparent distress States she went and took a nap and when she woke up she had right-sided weakness and had difficulty getting her words out.  Called her husband and told her husband she was going to call EMS. CT head at outside hospital was with no acute process and she was given TNK for NIH of 12 transferred to [facility] for further stroke workup and management.  No new neurological events overnight.  Right-sided weakness is improved still having speech difficulties with childlike quality Left MRI brain with no acute process.  Will start aspirin 81 mg daily and transfer her out of the ICU  Vitals:   12/27/22 0800 12/27/22 0900 12/27/22 1000 12/27/22 1100  BP: 135/67 121/71 (!) 144/69 127/71  Pulse: 83 91 88 87  Resp: 14 20 14    Temp: (!) 97.5 F (36.4 C)     TempSrc: Oral     SpO2: 99% 98% 95% 98%  Weight:      Height:       CBC:  Recent Labs  Lab 12/26/22 1212 12/26/22 1222  WBC 6.9  --   NEUTROABS 4.0  --   HGB 12.4 13.3  HCT 38.0 39.0  MCV 98.4  --   PLT 333  --    Basic Metabolic Panel:  Recent Labs  Lab 12/26/22 1212 12/26/22 1222  NA 137 139  K 4.5 4.6  CL 101 100  CO2 27  --   GLUCOSE 107* 105*  BUN 22 26*  CREATININE 1.08* 1.10*  CALCIUM 8.9  --    Lipid Panel:  Recent Labs  Lab 12/27/22 0114  CHOL 125  TRIG 131  HDL 49  CHOLHDL 2.6  VLDL 26  LDLCALC 50   HgbA1c:  Recent Labs  Lab 12/26/22 1601  HGBA1C 5.2   Urine Drug Screen:  Recent Labs  Lab 12/26/22 1258  LABOPIA NONE DETECTED  COCAINSCRNUR NONE DETECTED  LABBENZ NONE DETECTED  AMPHETMU NONE DETECTED  THCU NONE DETECTED  LABBARB NONE DETECTED    Alcohol Level  Recent Labs  Lab 12/26/22 1212  ETH <10    IMAGING past 24 hours CT ANGIO HEAD NECK W WO CM (CODE STROKE)  Result Date: 12/26/2022 CLINICAL DATA:  Right-sided weakness and facial droop.  Speech difficulty. Last known well at 10 o'clock a.m. EXAM: CT ANGIOGRAPHY HEAD AND NECK WITH AND WITHOUT CONTRAST TECHNIQUE: Multidetector CT imaging of the head and neck was performed using the standard protocol during bolus administration of intravenous contrast. Multiplanar CT image reconstructions and MIPs were obtained to evaluate the vascular anatomy. Carotid stenosis measurements (when applicable) are obtained utilizing NASCET criteria, using the distal internal carotid diameter as the denominator. RADIATION DOSE REDUCTION: This exam was performed according to the departmental dose-optimization program which includes automated exposure control, adjustment of the mA and/or kV according to patient size and/or use of iterative reconstruction technique. CONTRAST:  75mL OMNIPAQUE IOHEXOL 350 MG/ML SOLN COMPARISON:  CT head without contrast 12/26/2022. MRA head 06/13/2019 FINDINGS: CTA NECK FINDINGS Aortic arch: A 4 vessel arch configuration is present. The left vertebral artery Para March struck the from the arch, a normal variant. No significant atherosclerotic changes are present. No stenosis or aneurysm is present. Right carotid system: The right common carotid artery is within normal limits. Bifurcation is unremarkable. Mild tortuosity is present cervical right ICA without focal stenosis.  Left carotid system: The left common carotid artery is within normal limits. The bifurcation is unremarkable. Mild tortuosity is present cervical left ICA without focal stenosis. Vertebral arteries: The right vertebral artery chin its from the subclavian artery without focal stenosis. The vertebral arteries are codominant. No significant stenosis is present in either vertebral artery in the neck. Skeleton: Cervical fusion noted at C5-6 and C6-7. Adjacent level disease is present with endplate sclerotic changes at C4-5. Alignment is anatomic. No focal osseous lesions are present. Other neck: Soft tissues the neck are otherwise  unremarkable. Salivary glands are within normal limits. Thyroid is normal. No significant adenopathy is present. No focal mucosal or submucosal lesions are present. Upper chest: The lung apices are clear. Thoracic inlet is within normal limits. Review of the MIP images confirms the above findings CTA HEAD FINDINGS Anterior circulation: The internal carotid artery is within normal limits from the skull base through the ICA termini. The A1 and M1 segments are normal. The anterior communicating artery is patent. The MCA bifurcations are normal bilaterally. ACA and MCA branch vessels are within normal limits bilaterally. No aneurysm is present. Posterior circulation: The vertebral arteries are codominant. PICA origins are visualized and normal. The vertebrobasilar junction and basilar artery are normal. The superior cerebellar arteries are within normal limits. Both posterior cerebral arteries originate from basilar tip. The PCA branch vessels are within normal limits bilaterally. Venous sinuses: The dural sinuses are patent. The straight sinus and deep cerebral veins are intact. Cortical veins are within normal limits. No significant vascular malformation is evident. Anatomic variants: None Review of the MIP images confirms the above findings IMPRESSION: 1. Mild tortuosity of the cervical internal carotid arteries bilaterally without focal stenosis. This is nonspecific, but can be seen in the setting of chronic hypertension. 2. No significant stenosis in the neck. 3. Normal variant CTA Circle of Willis without significant proximal stenosis, aneurysm, or branch vessel occlusion. 4. Cervical fusion at C5-6 and C6-7. Adjacent level disease is present with endplate sclerotic changes at C4-5. Electronically Signed   By: Marin Roberts M.D.   On: 12/26/2022 12:57   CT HEAD CODE STROKE WO CONTRAST  Result Date: 12/26/2022 CLINICAL DATA:  Code stroke. Neuro deficit, acute, stroke suspected. Aphasia. Right-sided  weakness and facial droop. EXAM: CT HEAD WITHOUT CONTRAST TECHNIQUE: Contiguous axial images were obtained from the base of the skull through the vertex without intravenous contrast. RADIATION DOSE REDUCTION: This exam was performed according to the departmental dose-optimization program which includes automated exposure control, adjustment of the mA and/or kV according to patient size and/or use of iterative reconstruction technique. COMPARISON:  MR head without contrast 06/13/2019. CT head without contrast/1/20. FINDINGS: Brain: No acute infarct, hemorrhage, or mass lesion is present. Mild white matter changes are again noted. Deep brain nuclei are within normal limits. The ventricles are of normal size. No significant extraaxial fluid collection is present. The brainstem and cerebellum are within normal limits. Midline structures are within normal limits. Vascular: Minimal atherosclerotic changes are present within the cavernous internal carotid arteries. Hyperdense vessel is present. Skull: Calvarium is intact. No focal lytic or blastic lesions are present. No significant extracranial soft tissue lesion is present. Sinuses/Orbits: The paranasal sinuses and mastoid air cells are clear. Bilateral lens replacements are noted. Globes and orbits are otherwise unremarkable. ASPECTS [facility] Stroke Program Early CT Score) - Ganglionic level infarction (caudate, lentiform nuclei, internal capsule, insula, M1-M3 cortex): 7/7 - Supraganglionic infarction (M4-M6 cortex): 3/3 Total score (0-10 with 10 being  normal): 10/10 IMPRESSION: 1. No acute intracranial abnormality or significant interval change. 2. Stable mild white matter disease. This likely reflects the sequela of chronic microvascular ischemia. 3. Aspects is 10/10. These results were called by telephone at the time of interpretation on 12/26/2022 at 12:32 pm to provider [facility] , who verbally acknowledged these results. Electronically Signed   By:  Marin Roberts M.D.   On: 12/26/2022 12:33    PHYSICAL EXAM  Temp:  [97.5 F (36.4 C)-98.6 F (37 C)] 97.5 F (36.4 C) (04/21 0800) Pulse Rate:  [76-96] 87 (04/21 1200) Resp:  [11-27] 27 (04/21 1200) BP: (81-172)/(52-98) 140/86 (04/21 1200) SpO2:  [92 %-100 %] 98 % (04/21 1200)  General - Well nourished, well developed, in no apparent distress. Cardiovascular - Regular rhythm and rate.  Mental Status -  Level of arousal and orientation to time, place, and person were intact.  Childlike speech no aphasia or dysarthria which appears variable with distraction Language including expression, naming, repetition, comprehension was assessed and found intact. Attention span and concentration were normal. Recent and remote memory were intact. Fund of Knowledge was assessed and was intact.  Cranial Nerves II - XII - II - Visual field intact OU. III, IV, VI - Extraocular movements intact. V - Facial sensation intact bilaterally. VII - Facial movement intact bilaterally. VIII - Hearing & vestibular intact bilaterally. X - Palate elevates symmetrically. XI - Chin turning & shoulder shrug intact bilaterally. XII - Tongue protrusion intact.  Motor Strength -bilateral uppers equal strength with no drift, left leg 3 out of 5 (has had hip surgery a month ago), right leg 4 out of 5 Motor Tone - Muscle tone was assessed at the neck and appendages and was normal. Sensory -inconsistent  Coordination - The patient had normal movements in the hands and feet with no ataxia or dysmetria.  Tremor was absent.  Gait and Station - deferred.  ASSESSMENT/PLAN Ms. JENIN BIRDSALL is a 67 y.o. female with history of CAD, CKD, COPD, depression and anxiety, hypertension, hyperlipidemia, migraines, strokelike episodes, GERD, CHF, Raynaud's disease, who presented to [facility] for acute onset of right-sided weakness and speech difficulties.  NIH was 12 at [facility] and she was given TNK and  transferred to [facility] for further workup  Strokelike symptoms status post TNK  Code Stroke  CT head No acute abnormality. ASPECTS 10.    CTA head & neck no LVO MRI no acute process 2D Echo pending LDL 50 HgbA1c 5.2 VTE prophylaxis -SCDs    Diet   Diet Heart Room service appropriate? Yes with Assist; Fluid consistency: Thin   325 twice daily prior to admission, now on aspirin 81.  Therapy recommendations: Pending Disposition: Pending  Hypertension Home meds: Imdur 30, losartan 25 mg, Stable Long-term BP goal normotensive  Hyperlipidemia Home meds: Repatha, not resumed in hospital LDL 50, goal < 70 Continue statin at discharge  Migraines Continue Depakote 250 mg daily  Other Stroke Risk Factors Advanced Age >/= 37  Obesity, Body mass index is 37.71 kg/m., BMI >/= 30 associated with increased stroke risk, recommend weight loss, diet and exercise as appropriate  Coronary artery disease Migraines Congestive heart failure  Other Active Problems COPD GERD Insomnia  Hospital day # 1  Gevena Mart DNP, ACNPC-AG  [facility]  STROKE MD NOTE; I have personally obtained history,examined this patient, reviewed notes, independently viewed imaging studies, participated in medical decision making and plan of care.ROS completed by me personally and  pertinent positives fully documented  I have made any additions or clarifications directly to the above note. Agree with note above.  67 year old lady who presented with subjective speech difficulties with some right-sided weakness with normal organic features to her complaints as well as exam.  She received IV TNK and has obtained significant improvement in her weakness but has speech difficulty mostly with childlike quality nonorganic features.  MRI is negative for acute stroke and CT angiogram shows no significant large vessel stenosis or occlusion.  Recommend mobilize out of bed.  Strict blood pressure control close  neurological monitoring as per post TNK protocol.  Continue ongoing stroke workup.  Recommend aspirin given history of coronary artery disease.  Long discussion with patient and husband at the bedside and answered questions.This patient is critically ill and at significant risk of neurological worsening, death and care requires constant monitoring of vital signs, hemodynamics,respiratory and cardiac monitoring, extensive review of multiple databases, frequent neurological assessment, discussion with family, other specialists and medical decision making of high complexity.I have made any additions or clarifications directly to the above note.This critical care time does not reflect procedure time, or teaching time or supervisory time of PA/NP/Med Resident etc but could involve care discussion time.  I spent 30 minutes of neurocritical care time  in the care of  this patient.      Delia Heady, MD Medical Director [facility] Stroke Center Pager: [phone] 12/27/2022 3:28 PM   To contact Stroke Continuity provider, please refer to [email or web address]. After hours, contact General Neurology

## 2022-12-27 NOTE — Evaluation (Signed)
Physical Therapy Evaluation Patient Details Name: DARNELLE DERRICK MRN: [id number] DOB: Feb 28, 1956 Today's Date: 12/27/2022  History of Present Illness  Pt is 67 yo female who presented to [facility] on 12/26/22 with R sided weakness and speech abnormalities, received TNK at 1245. PMH: CVA, CAD, CKD, asth,a. COPD, HTN, gout, Raynauds, recent L THA  Clinical Impression  Pt admitted with above diagnosis. Pt from home with husband where she was returning to independence from L THA in March 2024 as well as a similar neuro event several months ago. Pt was using rollator in community and [facility] in home. On eval pt presents with coordination deficits on R side. Had R knee buckling in standing and needed mod A +2 to safely pivot to recliner with RW. Pt also with some residual L hip pain. Patient will benefit from intensive inpatient follow up therapy, >3 hours/day  Pt currently with functional limitations due to the deficits listed below (see PT Problem List). Pt will benefit from acute skilled PT to increase their independence and safety with mobility to allow discharge.          Recommendations for follow up therapy are one component of a multi-disciplinary discharge planning process, led by the attending physician.  Recommendations may be updated based on patient status, additional functional criteria and insurance authorization.  Follow Up Recommendations       Assistance Recommended at Discharge Frequent or constant Supervision/Assistance  Patient can return home with the following  Two people to help with walking and/or transfers;A lot of help with bathing/dressing/bathroom;Assistance with cooking/housework;Assist for transportation;Help with stairs or ramp for entrance    Equipment Recommendations None recommended by PT  Recommendations for Other Services  Rehab consult    Functional Status Assessment Patient has had a recent decline in their functional status and demonstrates the ability to make  significant improvements in function in a reasonable and predictable amount of time.     Precautions / Restrictions Precautions Precautions: Fall Precaution Comments: L posterior hip precautons, watch BP, R knee buckle Restrictions Weight Bearing Restrictions: No      Mobility  Bed Mobility Overal bed mobility: Needs Assistance Bed Mobility: Supine to Sit     Supine to sit: Supervision, HOB elevated, Min guard     General bed mobility comments: increased time needed    Transfers Overall transfer level: Needs assistance Equipment used: Rolling walker (2 wheels) Transfers: Sit to/from Stand, Bed to chair/wheelchair/BSC Sit to Stand: Max assist, +2 safety/equipment   Step pivot transfers: +2 physical assistance, Mod assist       General transfer comment: R knee buckle in standing. Needed max A for power up with first sit>stand, mod A second time from chair. Mod A needed on R side with step pivot and +2 for safety. Pt having difficulty holding RW with R hand and placing it awkwardly on hand grip. Min A needed for positioning    Ambulation/Gait               General Gait Details: pt began reporting light headedness and R knee buckling so deferred further ambulation today  Stairs            Wheelchair Mobility    Modified Rankin (Stroke Patients Only) Modified Rankin (Stroke Patients Only) Pre-Morbid Rankin Score: Slight disability Modified Rankin: Moderately severe disability     Balance Overall balance assessment: Needs assistance Sitting-balance support: Feet supported, Single extremity supported Sitting balance-Leahy Scale: Fair     Standing balance support: Reliant on  assistive device for balance, Bilateral upper extremity supported Standing balance-Leahy Scale: Poor                               Pertinent Vitals/Pain Pain Assessment Pain Assessment: Faces Faces Pain Scale: Hurts little more Pain Location: L groin with hip  AROM Pain Descriptors / Indicators: Aching, Tender Pain Intervention(s): Limited activity within patient's tolerance, Monitored during session    Home Living Family/patient expects to be discharged to:: Private residence Living Arrangements: Spouse/significant other;Other (Comment) (SIL flying in from [place] to help) Available Help at Discharge: Family;Available 24 hours/day Type of Home: House Home Access: Stairs to enter Entrance Stairs-Rails: Right Entrance Stairs-Number of Steps: 4   Home Layout: Two level;Able to live on main level with bedroom/bathroom Home Equipment: Rolling Walker (2 wheels);Cane - single point;BSC/3in1;Shower seat;Adaptive equipment      Prior Function Prior Level of Function : Independent/Modified Independent             Mobility Comments: Using 4WRW in the community and SPC in the home ADLs Comments: Ind with ADLs/IADLs, does not drive     Hand Dominance   Dominant Hand: Left    Extremity/Trunk Assessment   Upper Extremity Assessment Upper Extremity Assessment: Defer to OT evaluation RUE Deficits / Details: Able to move through AROM, but slowed and purposeful.  Decreased coordination.  Overshoots/undershoots.  Arm feel sheavy with decreased sensation to hand RUE Sensation: decreased light touch;decreased proprioception RUE Coordination: decreased fine motor;decreased gross motor    Lower Extremity Assessment Lower Extremity Assessment: RLE deficits/detail;LLE deficits/detail RLE Deficits / Details: pt reports tingling RLE, on MMT initial strength of knee ext and flex is 4/5 but pt does not hold contraction, loses it and is then able to activate muscle again RLE Sensation: decreased proprioception RLE Coordination: decreased gross motor LLE Deficits / Details: >3/5 throughout since THA but c/o groin pain with mvmt LLE Sensation: WNL LLE Coordination: WNL    Cervical / Trunk Assessment Cervical / Trunk Assessment: Kyphotic  Communication    Communication: Expressive difficulties  Cognition Arousal/Alertness: Awake/alert Behavior During Therapy: WFL for tasks assessed/performed, Impulsive Overall Cognitive Status: Impaired/Different from baseline Area of Impairment: Following commands, Memory                     Memory: Decreased short-term memory Following Commands: Follows one step commands consistently, Follows one step commands with increased time       General Comments: somewhat childlike in her affect, looked to husband to answer some questions and he in turn encouraged her to try to remember (for example what side her handrail is on).        General Comments General comments (skin integrity, edema, etc.): VSS on RA. Husband present    Exercises     Assessment/Plan    PT Assessment Patient needs continued PT services  PT Problem List Decreased strength;Decreased activity tolerance;Decreased balance;Decreased mobility;Decreased coordination;Decreased cognition;Decreased knowledge of precautions;Impaired sensation;Impaired tone       PT Treatment Interventions DME instruction;Gait training;Stair training;Functional mobility training;Therapeutic activities;Therapeutic exercise;Balance training;Neuromuscular re-education;Cognitive remediation;Patient/family education    PT Goals (Current goals can be found in the Care Plan section)  Acute Rehab PT Goals Patient Stated Goal: return home PT Goal Formulation: With patient/family Time For Goal Achievement: 01/10/23 Potential to Achieve Goals: Good    Frequency Min 4X/week     Co-evaluation PT/OT/SLP Co-Evaluation/Treatment: Yes Reason for Co-Treatment: Complexity of the patient's  impairments (multi-system involvement);For patient/therapist safety;Necessary to address cognition/behavior during functional activity PT goals addressed during session: Mobility/safety with mobility;Balance;Proper use of DME;Strengthening/ROM OT goals addressed during  session: ADL's and self-care       AM-PAC PT "6 Clicks" Mobility  Outcome Measure Help needed turning from your back to your side while in a flat bed without using bedrails?: A Little Help needed moving from lying on your back to sitting on the side of a flat bed without using bedrails?: A Little Help needed moving to and from a bed to a chair (including a wheelchair)?: A Lot Help needed standing up from a chair using your arms (e.g., wheelchair or bedside chair)?: Total Help needed to walk in hospital room?: Total Help needed climbing 3-5 steps with a railing? : Total 6 Click Score: 11    End of Session Equipment Utilized During Treatment: Gait belt Activity Tolerance: Patient tolerated treatment well Patient left: in chair;with call bell/phone within reach;with family/visitor present Nurse Communication: Mobility status PT Visit Diagnosis: Unsteadiness on feet (R26.81);Hemiplegia and hemiparesis;Difficulty in walking, not elsewhere classified (R26.2);Apraxia (R48.2);Pain Hemiplegia - Right/Left: Right Hemiplegia - dominant/non-dominant: Dominant Hemiplegia - caused by: Cerebral infarction Pain - Right/Left: Left Pain - part of body: Hip    Time: 1610-9604 PT Time Calculation (min) (ACUTE ONLY): 23 min   Charges:   PT Evaluation $PT Eval Moderate Complexity: 1 Mod          Lyanne Co, PT  Acute Rehab Services Secure chat preferred Office [phone]   Lawana Chambers Oaklen Thiam 12/27/2022, 2:15 PM

## 2022-12-27 NOTE — Plan of Care (Signed)
  Problem: Education: Goal: Knowledge of disease or condition will improve 12/27/2022 2149 by Barnett Hatter, RN Outcome: Progressing 12/27/2022 2119 by Barnett Hatter, RN Outcome: Progressing Goal: Knowledge of secondary prevention will improve (MUST DOCUMENT ALL) 12/27/2022 2149 by Barnett Hatter, RN Outcome: Progressing 12/27/2022 2119 by Barnett Hatter, RN Outcome: Progressing Goal: Knowledge of patient specific risk factors will improve Loraine Leriche N/A or DELETE if not current risk factor) 12/27/2022 2149 by Barnett Hatter, RN Outcome: Progressing 12/27/2022 2119 by Barnett Hatter, RN Outcome: Progressing   Problem: Ischemic Stroke/TIA Tissue Perfusion: Goal: Complications of ischemic stroke/TIA will be minimized 12/27/2022 2149 by Barnett Hatter, RN Outcome: Progressing 12/27/2022 2119 by Barnett Hatter, RN Outcome: Progressing   Problem: Coping: Goal: Will verbalize positive feelings about self 12/27/2022 2149 by Barnett Hatter, RN Outcome: Progressing 12/27/2022 2119 by Barnett Hatter, RN Outcome: Progressing Goal: Will identify appropriate support needs 12/27/2022 2149 by Barnett Hatter, RN Outcome: Progressing 12/27/2022 2119 by Barnett Hatter, RN Outcome: Progressing   Problem: Health Behavior/Discharge Planning: Goal: Ability to manage health-related needs will improve 12/27/2022 2149 by Barnett Hatter, RN Outcome: Progressing 12/27/2022 2119 by Barnett Hatter, RN Outcome: Progressing Goal: Goals will be collaboratively established with patient/family 12/27/2022 2149 by Barnett Hatter, RN Outcome: Progressing 12/27/2022 2119 by Barnett Hatter, RN Outcome: Progressing   Problem: Self-Care: Goal: Ability to participate in self-care as condition permits will improve 12/27/2022 2149 by Barnett Hatter, RN Outcome: Progressing 12/27/2022 2119 by Barnett Hatter, RN Outcome: Progressing Goal: Verbalization of feelings and concerns over difficulty with self-care  will improve 12/27/2022 2149 by Barnett Hatter, RN Outcome: Progressing 12/27/2022 2119 by Barnett Hatter, RN Outcome: Progressing Goal: Ability to communicate needs accurately will improve 12/27/2022 2149 by Barnett Hatter, RN Outcome: Progressing 12/27/2022 2119 by Barnett Hatter, RN Outcome: Progressing   Problem: Nutrition: Goal: Risk of aspiration will decrease 12/27/2022 2149 by Barnett Hatter, RN Outcome: Progressing 12/27/2022 2119 by Barnett Hatter, RN Outcome: Progressing Goal: Dietary intake will improve 12/27/2022 2149 by Barnett Hatter, RN Outcome: Progressing 12/27/2022 2119 by Barnett Hatter, RN Outcome: Progressing   Problem: Education: Goal: Knowledge of General Education information will improve Description: Including pain rating scale, medication(s)/side effects and non-pharmacologic comfort measures Outcome: Progressing   Problem: Health Behavior/Discharge Planning: Goal: Ability to manage health-related needs will improve Outcome: Progressing   Problem: Clinical Measurements: Goal: Ability to maintain clinical measurements within normal limits will improve Outcome: Progressing Goal: Will remain free from infection Outcome: Progressing Goal: Diagnostic test results will improve Outcome: Progressing Goal: Respiratory complications will improve Outcome: Progressing Goal: Cardiovascular complication will be avoided Outcome: Progressing   Problem: Activity: Goal: Risk for activity intolerance will decrease Outcome: Progressing   Problem: Nutrition: Goal: Adequate nutrition will be maintained Outcome: Progressing   Problem: Coping: Goal: Level of anxiety will decrease Outcome: Progressing   Problem: Elimination: Goal: Will not experience complications related to bowel motility Outcome: Progressing Goal: Will not experience complications related to urinary retention Outcome: Progressing   Problem: Pain Managment: Goal: General experience of  comfort will improve Outcome: Progressing   Problem: Safety: Goal: Ability to remain free from injury will improve Outcome: Progressing   Problem: Skin Integrity: Goal: Risk for impaired skin integrity will decrease Outcome: Progressing

## 2022-12-28 ENCOUNTER — Inpatient Hospital Stay (HOSPITAL_COMMUNITY): Payer: Medicare Other

## 2022-12-28 DIAGNOSIS — I6389 Other cerebral infarction: Secondary | ICD-10-CM | POA: Diagnosis not present

## 2022-12-28 DIAGNOSIS — I639 Cerebral infarction, unspecified: Secondary | ICD-10-CM | POA: Diagnosis not present

## 2022-12-28 LAB — ECHOCARDIOGRAM COMPLETE
AR max vel: 2.32 cm2
AV Area VTI: 2.03 cm2
AV Area mean vel: 2.2 cm2
AV Mean grad: 3 mmHg
AV Peak grad: 5.4 mmHg
Ao pk vel: 1.16 m/s
Area-P 1/2: 3.39 cm2
Height: 64 in
S' Lateral: 3.35 cm
Weight: 3515.2 oz

## 2022-12-28 MED ORDER — DIVALPROEX SODIUM ER 500 MG PO TB24
500.0000 mg | ORAL_TABLET | Freq: Every morning | ORAL | Status: DC
  Administered 2022-12-28 – 2022-12-31 (×4): 500 mg via ORAL
  Filled 2022-12-28 (×4): qty 1

## 2022-12-28 MED ORDER — ENOXAPARIN SODIUM 40 MG/0.4ML IJ SOSY
40.0000 mg | PREFILLED_SYRINGE | Freq: Every day | INTRAMUSCULAR | Status: DC
  Administered 2022-12-28 – 2022-12-31 (×4): 40 mg via SUBCUTANEOUS
  Filled 2022-12-28 (×4): qty 0.4

## 2022-12-28 NOTE — Progress Notes (Addendum)
STROKE TEAM PROGRESS NOTE   INTERVAL HISTORY Husband is at bedside.  Patient is awake alert sitting in chair, no apparent distress. No acute events overnight, right-sided weakness is improved, still having slight speech difficulties with intermittent childlike quality.Marland Kitchen  MRI brain negative for acute process.  Aspirin 81 daily was started yesterday Pending bed on 3W for transfer out of ICU. PT/OT recommending CIR placement.   Vitals:   12/28/22 1200 12/28/22 1300 12/28/22 1400 12/28/22 1420  BP: (!) 143/82 (!) 154/138  (!) 142/59  Pulse: 90 89 90 91  Resp:    13  Temp: 97.8 F (36.6 C)     TempSrc: Oral     SpO2: 96% 96% 98% 99%  Weight:      Height:       CBC:  Recent Labs  Lab 12/26/22 1212 12/26/22 1222  WBC 6.9  --   NEUTROABS 4.0  --   HGB 12.4 13.3  HCT 38.0 39.0  MCV 98.4  --   PLT 333  --     Basic Metabolic Panel:  Recent Labs  Lab 12/26/22 1212 12/26/22 1222  NA 137 139  K 4.5 4.6  CL 101 100  CO2 27  --   GLUCOSE 107* 105*  BUN 22 26*  CREATININE 1.08* 1.10*  CALCIUM 8.9  --     Lipid Panel:  Recent Labs  Lab 12/27/22 0114  CHOL 125  TRIG 131  HDL 49  CHOLHDL 2.6  VLDL 26  LDLCALC 50    HgbA1c:  Recent Labs  Lab 12/26/22 1601  HGBA1C 5.2    Urine Drug Screen:  Recent Labs  Lab 12/26/22 1258  LABOPIA NONE DETECTED  COCAINSCRNUR NONE DETECTED  LABBENZ NONE DETECTED  AMPHETMU NONE DETECTED  THCU NONE DETECTED  LABBARB NONE DETECTED     Alcohol Level  Recent Labs  Lab 12/26/22 1212  ETH <10     IMAGING past 24 hours ECHOCARDIOGRAM COMPLETE  Result Date: 12/28/2022    ECHOCARDIOGRAM REPORT   Patient Name:   Tonya Boyd Date of Exam: 12/28/2022 Medical Rec #:  [id number]        Height:       64.0 in Accession #:    [id number]       Weight:       219.7 lb Date of Birth:  [birth year]        BSA:          2.036 m Patient Age:    67 years         BP:           154/138 mmHg Patient Gender: F                HR:           93  bpm. Exam Location:  Inpatient Procedure: 2D Echo, Cardiac Doppler and Color Doppler Indications:    Stroke I63.9  History:        Patient has prior history of Echocardiogram examinations, most                 recent 11/01/2020. CAD, Stroke; Risk Factors:Dyslipidemia and                 Non-Smoker.  Sonographer:    Dondra Prader RVT RCS Referring Phys: [phone] ERIC LINDZEN  Sonographer Comments: Technically challenging study due to limited acoustic windows. IMPRESSIONS  1. Left ventricular ejection fraction, by estimation, is 60 to 65%. The left ventricle  has normal function. The left ventricle has no regional wall motion abnormalities. There is mild concentric left ventricular hypertrophy. Left ventricular diastolic parameters were normal.  2. Right ventricular systolic function is normal. The right ventricular size is normal.  3. The mitral valve is normal in structure. Trivial mitral valve regurgitation. No evidence of mitral stenosis.  4. The aortic valve is tricuspid. There is mild calcification of the aortic valve. Aortic valve regurgitation is not visualized. Aortic valve sclerosis/calcification is present, without any evidence of aortic stenosis.  5. The inferior vena cava is normal in size with greater than 50% respiratory variability, suggesting right atrial pressure of 3 mmHg.  6. Descending aorta appears dilated on limited imaging. Consider dedicated abdominal u/s to further evalaute. FINDINGS  Left Ventricle: Left ventricular ejection fraction, by estimation, is 60 to 65%. The left ventricle has normal function. The left ventricle has no regional wall motion abnormalities. The left ventricular internal cavity size was normal in size. There is  mild concentric left ventricular hypertrophy. Left ventricular diastolic parameters were normal. Right Ventricle: The right ventricular size is normal. No increase in right ventricular wall thickness. Right ventricular systolic function is normal. Left Atrium: Left  atrial size was normal in size. Right Atrium: Right atrial size was normal in size. Pericardium: There is no evidence of pericardial effusion. Mitral Valve: The mitral valve is normal in structure. Trivial mitral valve regurgitation. No evidence of mitral valve stenosis. Tricuspid Valve: The tricuspid valve is normal in structure. Tricuspid valve regurgitation is trivial. No evidence of tricuspid stenosis. Aortic Valve: The aortic valve is tricuspid. There is mild calcification of the aortic valve. Aortic valve regurgitation is not visualized. Aortic valve sclerosis/calcification is present, without any evidence of aortic stenosis. Aortic valve mean gradient measures 3.0 mmHg. Aortic valve peak gradient measures 5.4 mmHg. Aortic valve area, by VTI measures 2.03 cm. Pulmonic Valve: The pulmonic valve was normal in structure. Pulmonic valve regurgitation is not visualized. No evidence of pulmonic stenosis. Aorta: The aortic root is normal in size and structure. Venous: The inferior vena cava is normal in size with greater than 50% respiratory variability, suggesting right atrial pressure of 3 mmHg. IAS/Shunts: No atrial level shunt detected by color flow Doppler.  LEFT VENTRICLE PLAX 2D LVIDd:         4.70 cm LVIDs:         3.35 cm LV PW:         1.15 cm LV IVS:        1.05 cm LVOT diam:     2.00 cm LV SV:         48 LV SV Index:   24 LVOT Area:     3.14 cm  RIGHT VENTRICLE             IVC RV Basal diam:  3.00 cm     IVC diam: 1.60 cm RV Mid diam:    2.70 cm RV S prime:     19.80 cm/s TAPSE (M-mode): 2.2 cm LEFT ATRIUM             Index        RIGHT ATRIUM           Index LA diam:        3.80 cm 1.87 cm/m   RA Area:     10.00 cm LA Vol (A2C):   36.8 ml 18.07 ml/m  RA Volume:   20.70 ml  10.17 ml/m LA Vol (A4C):   27.6  ml 13.56 ml/m LA Biplane Vol: 32.3 ml 15.86 ml/m  AORTIC VALVE                    PULMONIC VALVE AV Area (Vmax):    2.32 cm     PV Vmax:       0.90 m/s AV Area (Vmean):   2.20 cm     PV Peak  grad:  3.2 mmHg AV Area (VTI):     2.03 cm AV Vmax:           116.00 cm/s AV Vmean:          81.700 cm/s AV VTI:            0.238 m AV Peak Grad:      5.4 mmHg AV Mean Grad:      3.0 mmHg LVOT Vmax:         85.70 cm/s LVOT Vmean:        57.200 cm/s LVOT VTI:          0.154 m LVOT/AV VTI ratio: 0.65  AORTA Ao Root diam: 3.20 cm Ao Asc diam:  4.50 cm Ao Arch diam: 2.9 cm MITRAL VALVE MV Area (PHT): 3.39 cm    SHUNTS MV Decel Time: 224 msec    Systemic VTI:  0.15 m MV E velocity: 70.10 cm/s  Systemic Diam: 2.00 cm MV A velocity: 89.10 cm/s MV E/A ratio:  0.79 Arvilla Meres MD Electronically signed by Arvilla Meres MD Signature Date/Time: 12/28/2022/2:18:31 PM    Final     PHYSICAL EXAM  Temp:  [97.5 F (36.4 C)-97.8 F (36.6 C)] 97.8 F (36.6 C) (04/22 1200) Pulse Rate:  [78-95] 91 (04/22 1420) Resp:  [12-33] 13 (04/22 1420) BP: (116-160)/(52-138) 142/59 (04/22 1420) SpO2:  [86 %-99 %] 99 % (04/22 1420)  General - Well nourished, well developed, in no apparent distress. Cardiovascular - Regular rhythm and rate.  Mental Status -  Level of arousal and orientation to time, place, and person were intact.  Childlike speech, no aphasia or dysarthria which appears variable with distraction Language including expression, naming, repetition, comprehension was assessed and found intact. Attention span and concentration were normal. Recent and remote memory were intact. Fund of Knowledge was assessed and was intact.  Cranial Nerves II - XII - II - Visual field intact OU. III, IV, VI - Extraocular movements intact. V - Facial sensation intact bilaterally. VII - Facial movement intact bilaterally. VIII - Hearing & vestibular intact bilaterally. X - Palate elevates symmetrically. XI - Chin turning & shoulder shrug intact bilaterally. XII - Tongue protrusion intact.  Motor Strength -bilateral uppers equal strength with no drift, left leg 3 out of 5 (has had hip surgery a month ago), right leg  4/5. Motor Tone - Muscle tone was assessed at the neck and appendages and was normal. Sensory - subjective decreased to right side noted Coordination - The patient had normal movements in the hands and feet with no ataxia or dysmetria.  Tremor was absent.  Gait and Station - deferred.  ASSESSMENT/PLAN Tonya Boyd is a 67 y.o. female with history of CAD, CKD, COPD, depression and anxiety, hypertension, hyperlipidemia, migraines, strokelike episodes, GERD, CHF, Raynaud's disease, who presented to [facility] for acute onset of right-sided weakness and speech difficulties.  NIH was 12 at [facility] and she was given TNK and transferred to [facility] for further workup  Strokelike symptoms ?due to complicated migraine status post TNK  Code Stroke  CT head No acute abnormality. ASPECTS 10.    CTA head & neck no LVO MRI no acute process 2D Echo pending LDL 50 HgbA1c 5.2 VTE prophylaxis -SCDs    Diet   Diet Heart Room service appropriate? Yes with Assist; Fluid consistency: Thin   325 twice daily prior to admission, now on aspirin 81.  Therapy recommendations: CIR Disposition: Pending  Hypertension Home meds: Imdur 30, losartan 25 mg Stable Long-term BP goal normotensive  Hyperlipidemia Home meds: Repatha, not resumed in hospital LDL 50, goal < 70 Continue statin at discharge  Migraines Continue Depakote, increased to home dose of 500 mg daily  Other Stroke Risk Factors Advanced Age >/= 49  Obesity, Body mass index is 37.71 kg/m., BMI >/= 30 associated with increased stroke risk, recommend weight loss, diet and exercise as appropriate  Coronary artery disease Migraines Congestive heart failure  Other Active Problems COPD GERD Insomnia  Hospital day # 2   Pt seen by Neuro NP/APP and later by MD. Note/plan to be edited by MD as needed.    Lynnae January, DNP, AGACNP-BC [facility] Please use AMION for contact information & EPIC for  messaging.  I have personally obtained history,examined this patient, reviewed notes, independently viewed imaging studies, participated in medical decision making and plan of care.ROS completed by me personally and pertinent positives fully documented  I have made any additions or clarifications directly to the above note. Agree with note above.  Patient continues to have some normal organic speech difficulties and subjective right leg weakness and therapist recommend inpatient rehab.  Recommend mobilize out of bed.  Transfer to neurology floor bed.  Transfer to rehab after insurance approval.  Long discussion patient and husband at the bedside and answered questions.  Greater than 50% time during this 35-minute visit.  The counseling and coordination of care about strokelike symptoms and discussion about evaluation and treatment and rehabilitation plan  Delia Heady, MD Medical Director [facility] Pager: [phone] 12/28/2022 5:07 PM   To contact Stroke Continuity provider, please refer to [email or web address]. After hours, contact General Neurology

## 2022-12-28 NOTE — Progress Notes (Signed)
  Transition of Care [facility]) Screening Note   Patient Details  Name: Tonya Boyd Date of Birth: August 22, 1956   Transition of Care [facility]) CM/SW Contact:    Mearl Latin, LCSW Phone Number: 12/28/2022, 9:13 AM    Transition of Care Department [facility]) has reviewed patient and will follow for therapy needs. We will continue to monitor patient advancement through interdisciplinary progression rounds. If new patient transition needs arise, please place a TOC consult.

## 2022-12-28 NOTE — Discharge Summary (Incomplete)
Stroke Discharge Summary  Patient ID: Tonya Boyd   MRN: [id number]      DOB: [birth year]  Date of Admission: 12/26/2022 Date of Discharge: 12/31/2022  Attending Physician:  Stroke, Md, MD, Stroke MD Consultant(s):   None Patient's PCP:  Georgianne Fick, MD  Discharge Diagnoses:  Principal Problem:   Stroke-like symptoms-s/p IV TNK speech disturbance and transient right hemiparesis with nonorganic features Active Problems:   Essential hypertension   CAD (coronary artery disease), native coronary artery   Complicated migraine   GERD (gastroesophageal reflux disease)   Hyperlipidemia   Stroke   COPD (chronic obstructive pulmonary disease)   CHF (congestive heart failure)   CKD (chronic kidney disease)   Insomnia   Medications to be continued on Rehab Allergies as of 12/31/2022       Reactions   Adhesive [tape] Other (See Comments)   SKIN WILL TEAR EASILY!!!!   Brilinta [ticagrelor] Shortness Of Breath   Statins Other (See Comments)   myalgia   Carvedilol    GI upset   Diovan [valsartan]    Tingling    Hydrochlorothiazide    Urinary frequency   Ibuprofen    Causes bp to go up   Keflex [cephalexin]    confusion   Morphine And Related    Hyper, ineffective    Norvasc [amlodipine Besylate]    Numbness and tingling    Nsaids    Esophagus became red and swollen (tolerates meloxicam)    Prednisone    Elevated BP   Reglan [metoclopramide]    Confusion, altered mental status   Singulair [montelukast] Diarrhea   Temazepam Hives        Medication List     STOP taking these medications    acebutolol 200 MG capsule Commonly known as: SECTRAL   acetaminophen 500 MG tablet Commonly known as: TYLENOL   apixaban 2.5 MG Tabs tablet Commonly known as: ELIQUIS   aspirin 325 MG tablet Replaced by: aspirin 81 MG chewable tablet   cyclobenzaprine 10 MG tablet Commonly known as: FLEXERIL   hydrOXYzine 25 MG tablet Commonly known as: ATARAX    nitroGLYCERIN 0.4 MG SL tablet Commonly known as: NITROSTAT   traZODone 50 MG tablet Commonly known as: DESYREL       TAKE these medications    aspirin 81 MG chewable tablet Chew 1 tablet (81 mg total) by mouth daily. Replaces: aspirin 325 MG tablet   cyanocobalamin 1000 MCG/ML injection Commonly known as: VITAMIN B12 Inject 1,000 mcg into the skin every 30 (thirty) days.   divalproex 500 MG 24 hr tablet Commonly known as: DEPAKOTE ER Take 1 tablet (500 mg total) by mouth every morning. What changed:  medication strength how much to take when to take this   isosorbide mononitrate 30 MG 24 hr tablet Commonly known as: IMDUR Take 1 tablet (30 mg total) by mouth daily.   loratadine 10 MG tablet Commonly known as: CLARITIN Take 10 mg by mouth at bedtime.   losartan 25 MG tablet Commonly known as: COZAAR Take 1 tablet (25 mg total) by mouth every evening.   ondansetron 4 MG tablet Commonly known as: ZOFRAN Take 1 tablet (4 mg total) by mouth every 6 (six) hours as needed for nausea.   oxyCODONE 5 MG immediate release tablet Commonly known as: Oxy IR/ROXICODONE Take 1 tablet (5 mg total) by mouth every 8 (eight) hours as needed for severe pain. What changed:  how much to take when to take  this   pantoprazole 40 MG tablet Commonly known as: PROTONIX Take 1 tablet (40 mg total) by mouth daily with supper. What changed:  medication strength how much to take when to take this   polyethylene glycol 17 g packet Commonly known as: MIRALAX / GLYCOLAX Take 17 g by mouth daily as needed for moderate constipation or severe constipation.   Repatha SureClick 140 MG/ML Soaj Generic drug: Evolocumab INJECT 140 MG INTO THE SKIN EVERY 2 WEEKS.   senna-docusate 8.6-50 MG tablet Commonly known as: Senokot-S Take 1 tablet by mouth at bedtime as needed for mild constipation.   Symbicort 80-4.5 MCG/ACT inhaler Generic drug: budesonide-formoterol Inhale 2 puffs into the  lungs daily as needed (Coughing).   tiZANidine 4 MG tablet Commonly known as: ZANAFLEX Take 1 tablet (4 mg total) by mouth every 8 (eight) hours as needed for muscle spasms. What changed:  when to take this reasons to take this   traMADol 50 MG tablet Commonly known as: ULTRAM Take 1 tablet (50 mg total) by mouth every 6 (six) hours as needed (Breakthrough pain).   Vitamin D (Ergocalciferol) 1.25 MG (50000 UNIT) Caps capsule Commonly known as: DRISDOL Take 50,000 Units by mouth every Monday.        LABORATORY STUDIES CBC    Component Value Date/Time   WBC 6.9 12/26/2022 1212   RBC 3.86 (L) 12/26/2022 1212   HGB 13.3 12/26/2022 1222   HGB 13.3 08/21/2022 0807   HCT 39.0 12/26/2022 1222   HCT 40.6 08/21/2022 0807   PLT 333 12/26/2022 1212   PLT 392 08/21/2022 0807   MCV 98.4 12/26/2022 1212   MCV 93 08/21/2022 0807   MCH 32.1 12/26/2022 1212   MCHC 32.6 12/26/2022 1212   RDW 13.8 12/26/2022 1212   RDW 12.4 08/21/2022 0807   LYMPHSABS 1.6 12/26/2022 1212   LYMPHSABS 1.6 04/03/2021 1117   MONOABS 0.6 12/26/2022 1212   EOSABS 0.5 12/26/2022 1212   EOSABS 0.2 04/03/2021 1117   BASOSABS 0.1 12/26/2022 1212   BASOSABS 0.1 04/03/2021 1117   CMP    Component Value Date/Time   NA 139 12/26/2022 1222   NA 138 08/21/2022 0807   K 4.6 12/26/2022 1222   CL 100 12/26/2022 1222   CO2 27 12/26/2022 1212   GLUCOSE 105 (H) 12/26/2022 1222   BUN 26 (H) 12/26/2022 1222   BUN 21 08/21/2022 0807   CREATININE 1.10 (H) 12/26/2022 1222   CALCIUM 8.9 12/26/2022 1212   PROT 7.6 12/26/2022 1212   ALBUMIN 3.8 12/26/2022 1212   AST 22 12/26/2022 1212   ALT 19 12/26/2022 1212   ALKPHOS 118 12/26/2022 1212   BILITOT 0.4 12/26/2022 1212   GFRNONAA 57 (L) 12/26/2022 1212   GFRAA >60 06/13/2019 1430   COAGS Lab Results  Component Value Date   INR 0.9 12/26/2022   INR 1.1 06/13/2019   INR 1.1 06/08/2019   Lipid Panel    Component Value Date/Time   CHOL 125 12/27/2022 0114    CHOL 141 01/05/2020 0816   TRIG 131 12/27/2022 0114   HDL 49 12/27/2022 0114   HDL 61 01/05/2020 0816   CHOLHDL 2.6 12/27/2022 0114   VLDL 26 12/27/2022 0114   LDLCALC 50 12/27/2022 0114   LDLCALC 64 01/05/2020 0816   HgbA1C  Lab Results  Component Value Date   HGBA1C 5.2 12/26/2022   Urinalysis    Component Value Date/Time   COLORURINE STRAW (A) 12/26/2022 1258   APPEARANCEUR CLEAR 12/26/2022 1258  LABSPEC 1.016 12/26/2022 1258   PHURINE 6.0 12/26/2022 1258   GLUCOSEU NEGATIVE 12/26/2022 1258   HGBUR SMALL (A) 12/26/2022 1258   BILIRUBINUR NEGATIVE 12/26/2022 1258   KETONESUR NEGATIVE 12/26/2022 1258   PROTEINUR NEGATIVE 12/26/2022 1258   NITRITE NEGATIVE 12/26/2022 1258   LEUKOCYTESUR NEGATIVE 12/26/2022 1258   Urine Drug Screen     Component Value Date/Time   LABOPIA NONE DETECTED 12/26/2022 1258   COCAINSCRNUR NONE DETECTED 12/26/2022 1258   LABBENZ NONE DETECTED 12/26/2022 1258   AMPHETMU NONE DETECTED 12/26/2022 1258   THCU NONE DETECTED 12/26/2022 1258   LABBARB NONE DETECTED 12/26/2022 1258    Alcohol Level    Component Value Date/Time   ETH <10 12/26/2022 1212     SIGNIFICANT DIAGNOSTIC STUDIES ECHOCARDIOGRAM COMPLETE  Result Date: 12/28/2022    ECHOCARDIOGRAM REPORT   Patient Name:   Tonya Boyd Date of Exam: 12/28/2022 Medical Rec #:  [id number]        Height:       64.0 in Accession #:    [id number]       Weight:       219.7 lb Date of Birth:  02-03-1956        BSA:          2.036 m Patient Age:    66 years         BP:           154/138 mmHg Patient Gender: F                HR:           93 bpm. Exam Location:  Inpatient Procedure: 2D Echo, Cardiac Doppler and Color Doppler Indications:    Stroke I63.9  History:        Patient has prior history of Echocardiogram examinations, most                 recent 11/01/2020. CAD, Stroke; Risk Factors:Dyslipidemia and                 Non-Smoker.  Sonographer:    Dondra Prader RVT RCS Referring Phys: [phone] ERIC  LINDZEN  Sonographer Comments: Technically challenging study due to limited acoustic windows. IMPRESSIONS  1. Left ventricular ejection fraction, by estimation, is 60 to 65%. The left ventricle has normal function. The left ventricle has no regional wall motion abnormalities. There is mild concentric left ventricular hypertrophy. Left ventricular diastolic parameters were normal.  2. Right ventricular systolic function is normal. The right ventricular size is normal.  3. The mitral valve is normal in structure. Trivial mitral valve regurgitation. No evidence of mitral stenosis.  4. The aortic valve is tricuspid. There is mild calcification of the aortic valve. Aortic valve regurgitation is not visualized. Aortic valve sclerosis/calcification is present, without any evidence of aortic stenosis.  5. The inferior vena cava is normal in size with greater than 50% respiratory variability, suggesting right atrial pressure of 3 mmHg.  6. Descending aorta appears dilated on limited imaging. Consider dedicated abdominal u/s to further evalaute. FINDINGS  Left Ventricle: Left ventricular ejection fraction, by estimation, is 60 to 65%. The left ventricle has normal function. The left ventricle has no regional wall motion abnormalities. The left ventricular internal cavity size was normal in size. There is  mild concentric left ventricular hypertrophy. Left ventricular diastolic parameters were normal. Right Ventricle: The right ventricular size is normal. No increase in right ventricular wall thickness. Right ventricular systolic function is normal. Left  Atrium: Left atrial size was normal in size. Right Atrium: Right atrial size was normal in size. Pericardium: There is no evidence of pericardial effusion. Mitral Valve: The mitral valve is normal in structure. Trivial mitral valve regurgitation. No evidence of mitral valve stenosis. Tricuspid Valve: The tricuspid valve is normal in structure. Tricuspid valve regurgitation is  trivial. No evidence of tricuspid stenosis. Aortic Valve: The aortic valve is tricuspid. There is mild calcification of the aortic valve. Aortic valve regurgitation is not visualized. Aortic valve sclerosis/calcification is present, without any evidence of aortic stenosis. Aortic valve mean gradient measures 3.0 mmHg. Aortic valve peak gradient measures 5.4 mmHg. Aortic valve area, by VTI measures 2.03 cm. Pulmonic Valve: The pulmonic valve was normal in structure. Pulmonic valve regurgitation is not visualized. No evidence of pulmonic stenosis. Aorta: The aortic root is normal in size and structure. Venous: The inferior vena cava is normal in size with greater than 50% respiratory variability, suggesting right atrial pressure of 3 mmHg. IAS/Shunts: No atrial level shunt detected by color flow Doppler.  LEFT VENTRICLE PLAX 2D LVIDd:         4.70 cm LVIDs:         3.35 cm LV PW:         1.15 cm LV IVS:        1.05 cm LVOT diam:     2.00 cm LV SV:         48 LV SV Index:   24 LVOT Area:     3.14 cm  RIGHT VENTRICLE             IVC RV Basal diam:  3.00 cm     IVC diam: 1.60 cm RV Mid diam:    2.70 cm RV S prime:     19.80 cm/s TAPSE (M-mode): 2.2 cm LEFT ATRIUM             Index        RIGHT ATRIUM           Index LA diam:        3.80 cm 1.87 cm/m   RA Area:     10.00 cm LA Vol (A2C):   36.8 ml 18.07 ml/m  RA Volume:   20.70 ml  10.17 ml/m LA Vol (A4C):   27.6 ml 13.56 ml/m LA Biplane Vol: 32.3 ml 15.86 ml/m  AORTIC VALVE                    PULMONIC VALVE AV Area (Vmax):    2.32 cm     PV Vmax:       0.90 m/s AV Area (Vmean):   2.20 cm     PV Peak grad:  3.2 mmHg AV Area (VTI):     2.03 cm AV Vmax:           116.00 cm/s AV Vmean:          81.700 cm/s AV VTI:            0.238 m AV Peak Grad:      5.4 mmHg AV Mean Grad:      3.0 mmHg LVOT Vmax:         85.70 cm/s LVOT Vmean:        57.200 cm/s LVOT VTI:          0.154 m LVOT/AV VTI ratio: 0.65  AORTA Ao Root diam: 3.20 cm Ao Asc diam:  4.50 cm Ao Arch diam:  2.9 cm  MITRAL VALVE MV Area (PHT): 3.39 cm    SHUNTS MV Decel Time: 224 msec    Systemic VTI:  0.15 m MV E velocity: 70.10 cm/s  Systemic Diam: 2.00 cm MV A velocity: 89.10 cm/s MV E/A ratio:  0.79 Arvilla Meres MD Electronically signed by Arvilla Meres MD Signature Date/Time: 12/28/2022/2:18:31 PM    Final    MR BRAIN WO CONTRAST  Result Date: 12/27/2022 CLINICAL DATA:  67 year old female code stroke presentation yesterday. Right side weakness and facial droop. EXAM: MRI HEAD WITHOUT CONTRAST TECHNIQUE: Multiplanar, multiecho pulse sequences of the brain and surrounding structures were obtained without intravenous contrast. COMPARISON:  CT head, CTA head and neck yesterday. Previous brain MRI 06/13/2019, 06/08/2019. FINDINGS: Brain: No restricted diffusion to suggest acute infarction. No midline shift, mass effect, evidence of mass lesion, ventriculomegaly, extra-axial collection or acute intracranial hemorrhage. Cervicomedullary junction and pituitary are within normal limits. Cerebral volume not significantly changed from 2020. Widely scattered cerebral white matter T2 and FLAIR hyperintensity in both hemispheres appears mildly progressed since that time. Extent is moderate for age, pattern is nonspecific. No cortical encephalomalacia or chronic cerebral blood products. Small T2 hyperintense focus in the central right thalamus is new since 2020. And a subtle chronic lacunar infarct in the inferior right cerebellum might be new (series 15, image 6). Otherwise the deep gray nuclei, brainstem and cerebellum are within normal limits. Vascular: Major intracranial vascular flow voids are stable. Skull and upper cervical spine: Partially visible C4-C5 cervical spine degeneration. Visualized bone marrow signal is within normal limits. Sinuses/Orbits: Stable, negative. Other: Trace right mastoid effusion is new since 2020. Negative visible nasopharynx. Left mastoids remain clear. Grossly negative other visible  internal auditory structures. Negative visible scalp and face. IMPRESSION: 1. No acute intracranial abnormality. 2. Up to moderate for age signal changes in the brain most commonly due to chronic small vessel disease. Small chronic lacunar infarcts in the right thalamus, and possibly also the right inferior cerebellum, appear new since 2020. Electronically Signed   By: Odessa Fleming M.D.   On: 12/27/2022 12:28   CT ANGIO HEAD NECK W WO CM (CODE STROKE)  Result Date: 12/26/2022 CLINICAL DATA:  Right-sided weakness and facial droop. Speech difficulty. Last known well at 10 o'clock a.m. EXAM: CT ANGIOGRAPHY HEAD AND NECK WITH AND WITHOUT CONTRAST TECHNIQUE: Multidetector CT imaging of the head and neck was performed using the standard protocol during bolus administration of intravenous contrast. Multiplanar CT image reconstructions and MIPs were obtained to evaluate the vascular anatomy. Carotid stenosis measurements (when applicable) are obtained utilizing NASCET criteria, using the distal internal carotid diameter as the denominator. RADIATION DOSE REDUCTION: This exam was performed according to the departmental dose-optimization program which includes automated exposure control, adjustment of the mA and/or kV according to patient size and/or use of iterative reconstruction technique. CONTRAST:  75mL OMNIPAQUE IOHEXOL 350 MG/ML SOLN COMPARISON:  CT head without contrast 12/26/2022. MRA head 06/13/2019 FINDINGS: CTA NECK FINDINGS Aortic arch: A 4 vessel arch configuration is present. The left vertebral artery Para March struck the from the arch, a normal variant. No significant atherosclerotic changes are present. No stenosis or aneurysm is present. Right carotid system: The right common carotid artery is within normal limits. Bifurcation is unremarkable. Mild tortuosity is present cervical right ICA without focal stenosis. Left carotid system: The left common carotid artery is within normal limits. The bifurcation is  unremarkable. Mild tortuosity is present cervical left ICA without focal stenosis. Vertebral arteries: The right vertebral artery chin  its from the subclavian artery without focal stenosis. The vertebral arteries are codominant. No significant stenosis is present in either vertebral artery in the neck. Skeleton: Cervical fusion noted at C5-6 and C6-7. Adjacent level disease is present with endplate sclerotic changes at C4-5. Alignment is anatomic. No focal osseous lesions are present. Other neck: Soft tissues the neck are otherwise unremarkable. Salivary glands are within normal limits. Thyroid is normal. No significant adenopathy is present. No focal mucosal or submucosal lesions are present. Upper chest: The lung apices are clear. Thoracic inlet is within normal limits. Review of the MIP images confirms the above findings CTA HEAD FINDINGS Anterior circulation: The internal carotid artery is within normal limits from the skull base through the ICA termini. The A1 and M1 segments are normal. The anterior communicating artery is patent. The MCA bifurcations are normal bilaterally. ACA and MCA branch vessels are within normal limits bilaterally. No aneurysm is present. Posterior circulation: The vertebral arteries are codominant. PICA origins are visualized and normal. The vertebrobasilar junction and basilar artery are normal. The superior cerebellar arteries are within normal limits. Both posterior cerebral arteries originate from basilar tip. The PCA branch vessels are within normal limits bilaterally. Venous sinuses: The dural sinuses are patent. The straight sinus and deep cerebral veins are intact. Cortical veins are within normal limits. No significant vascular malformation is evident. Anatomic variants: None Review of the MIP images confirms the above findings IMPRESSION: 1. Mild tortuosity of the cervical internal carotid arteries bilaterally without focal stenosis. This is nonspecific, but can be seen in  the setting of chronic hypertension. 2. No significant stenosis in the neck. 3. Normal variant CTA Circle of Willis without significant proximal stenosis, aneurysm, or branch vessel occlusion. 4. Cervical fusion at C5-6 and C6-7. Adjacent level disease is present with endplate sclerotic changes at C4-5. Electronically Signed   By: Marin Roberts M.D.   On: 12/26/2022 12:57   CT HEAD CODE STROKE WO CONTRAST  Result Date: 12/26/2022 CLINICAL DATA:  Code stroke. Neuro deficit, acute, stroke suspected. Aphasia. Right-sided weakness and facial droop. EXAM: CT HEAD WITHOUT CONTRAST TECHNIQUE: Contiguous axial images were obtained from the base of the skull through the vertex without intravenous contrast. RADIATION DOSE REDUCTION: This exam was performed according to the departmental dose-optimization program which includes automated exposure control, adjustment of the mA and/or kV according to patient size and/or use of iterative reconstruction technique. COMPARISON:  MR head without contrast 06/13/2019. CT head without contrast/1/20. FINDINGS: Brain: No acute infarct, hemorrhage, or mass lesion is present. Mild white matter changes are again noted. Deep brain nuclei are within normal limits. The ventricles are of normal size. No significant extraaxial fluid collection is present. The brainstem and cerebellum are within normal limits. Midline structures are within normal limits. Vascular: Minimal atherosclerotic changes are present within the cavernous internal carotid arteries. Hyperdense vessel is present. Skull: Calvarium is intact. No focal lytic or blastic lesions are present. No significant extracranial soft tissue lesion is present. Sinuses/Orbits: The paranasal sinuses and mastoid air cells are clear. Bilateral lens replacements are noted. Globes and orbits are otherwise unremarkable. ASPECTS [facility] Stroke Program Early CT Score) - Ganglionic level infarction (caudate, lentiform nuclei, internal  capsule, insula, M1-M3 cortex): 7/7 - Supraganglionic infarction (M4-M6 cortex): 3/3 Total score (0-10 with 10 being normal): 10/10 IMPRESSION: 1. No acute intracranial abnormality or significant interval change. 2. Stable mild white matter disease. This likely reflects the sequela of chronic microvascular ischemia. 3. Aspects is 10/10. These results  were called by telephone at the time of interpretation on 12/26/2022 at 12:32 pm to provider [facility] , who verbally acknowledged these results. Electronically Signed   By: Marin Roberts M.D.   On: 12/26/2022 12:33       HISTORY OF PRESENT ILLNESS Tonya Boyd is a 67 y.o. female with history of CAD, CKD, COPD, depression and anxiety, hypertension, hyperlipidemia, migraines, strokelike episodes, GERD, CHF, Raynaud's disease, who presented to [facility] for acute onset of right-sided weakness and speech difficulties.  NIH was 12 at [facility] and she was given TNK and transferred to [facility] for further workup    HOSPITAL COURSE Strokelike symptoms ?due to complicated migraine status post TNK  Code Stroke  CT head No acute abnormality. ASPECTS 10.    CTA head & neck no LVO MRI no acute process 2D Echo EF 60-65%. mild concentric left ventricular hypertrophy.  LDL 50 HgbA1c 5.2 VTE prophylaxis -SCDs       Diet    Diet Heart Room service appropriate? Yes with Assist; Fluid consistency: Thin        325 twice daily prior to admission, now on aspirin 81.  Therapy recommendations: CIR Disposition: Pending  Hypertension Home meds: Imdur 30, losartan 25 mg Stable Long-term BP goal normotensive   Hyperlipidemia Home meds: Repatha, not resumed in hospital LDL 50, goal < 70 Continue statin at discharge   Migraines Continue Depakote, increased to home dose of 500 mg daily   Other Stroke Risk Factors Advanced Age >/= 80  Obesity, Body mass index is 37.71 kg/m., BMI >/= 30 associated with increased stroke risk,  recommend weight loss, diet and exercise as appropriate  Coronary artery disease Migraines Congestive heart failure   Other Active Problems COPD GERD Insomnia  DISCHARGE EXAM Blood pressure 118/88, pulse (!) 101, temperature 98.2 F (36.8 C), temperature source Oral, resp. rate 17, height 5\' 4"  (1.626 m), weight 99.7 kg, SpO2 95 %.  General - Well nourished, well developed, in no apparent distress. Cardiovascular - Regular rhythm and rate.   Mental Status -  Level of arousal and orientation to time, place, and person were intact.  Childlike speech, no aphasia or dysarthria which appears variable with distraction--improved from yesterday. Language including expression, naming, repetition, comprehension was assessed and found intact. Attention span and concentration were normal. Recent and remote memory were intact. Fund of Knowledge was assessed and was intact.   Cranial Nerves II - XII - II - Visual field intact OU. III, IV, VI - Extraocular movements intact. V - Facial sensation intact bilaterally. VII - Facial movement intact bilaterally. VIII - Hearing & vestibular intact bilaterally. X - Palate elevates symmetrically. XI - Chin turning & shoulder shrug intact bilaterally. XII - Tongue protrusion intact.   Motor Strength -bilateral uppers equal strength with no drift, left leg 3 out of 5 (has had hip surgery a month ago), right leg 4/5. Motor Tone - Muscle tone was assessed at the neck and appendages and was normal. Sensory - subjective decreased to right side noted Coordination - The patient had normal movements in the hands and feet with no ataxia or dysmetria.  Tremor was absent.   Gait and Station - deferred.  Discharge Diet      Diet   Diet Heart Room service appropriate? Yes with Assist; Fluid consistency: Thin   liquids  DISCHARGE PLAN Disposition:  Transfer to [facility] for ongoing PT, OT and ST aspirin 81 mg daily  for secondary stroke  prevention  Recommend ongoing stroke risk factor control by Primary Care Physician at time of discharge from inpatient rehabilitation. Follow-up PCP Georgianne Fick, MD in 2 weeks following discharge from rehab. Follow-up in [facility] in 8 weeks following discharge from rehab, office to schedule an appointment.   50 minutes were spent preparing discharge.  Gevena Mart DNP, ACNPC-AG  [facility]  I have personally obtained history,examined this patient, reviewed notes, independently viewed imaging studies, participated in medical decision making and plan of care.ROS completed by me personally and pertinent positives fully documented  I have made any additions or clarifications directly to the above note. Agree with note above.    Delia Heady, MD Medical Director [facility] Pager: [phone] 12/31/2022 12:31 PM

## 2022-12-28 NOTE — Progress Notes (Signed)
Inpatient Rehab Admissions Coordinator:  ? ?Per therapy recommendations,  patient was screened for CIR candidacy by Revanth Neidig, MS, CCC-SLP. At this time, Pt. Appears to be a a potential candidate for CIR. I will place   order for rehab consult per protocol for full assessment. Please contact me any with questions. ? ?Payeton Germani, MS, CCC-SLP ?Rehab Admissions Coordinator  ?[phone] (celll) ?[phone] (office) ? ?

## 2022-12-28 NOTE — Progress Notes (Signed)
Attempted Echocardiogram, Patient is waiting for nurse to assist with patient care.

## 2022-12-28 NOTE — Progress Notes (Signed)
Physical Therapy Treatment Patient Details Name: Tonya Boyd MRN: [id number] DOB: 08/15/1956 Today's Date: 12/28/2022   History of Present Illness Pt is 67 yo female who presented to [facility] on 12/26/22 with R sided weakness and speech abnormalities, received TNK at 1245. PMH: CVA, CAD, CKD, asth,a. COPD, HTN, gout, Raynauds, recent L THA    PT Comments    The pt was agreeable to session with focus on progression of gait training and functional strength. She was able to complete multiple sit-stand transfers and stand-pivot transfers this session, but continues to need verbal cues for hand placement and modA to manage balance and buckling of RLE. The pt also tends to overcorrect with minor LOB or buckling, resulting in even higher risk of falls and increased assist needed. The pt tolerated gait training progression (6 ft + 3 ft) but needed modA of 2 and chair follow for safety and limited endurance. The pt has buckling of both LE intermittently with gait, and significant difficulties with DF and advancement of RLE. Will continue to benefit from skilled PT acutely and intensive therapies once medically stable for d/c.     Recommendations for follow up therapy are one component of a multi-disciplinary discharge planning process, led by the attending physician.  Recommendations may be updated based on patient status, additional functional criteria and insurance authorization.  Follow Up Recommendations       Assistance Recommended at Discharge Frequent or constant Supervision/Assistance  Patient can return home with the following Two people to help with walking and/or transfers;A lot of help with bathing/dressing/bathroom;Assistance with cooking/housework;Assist for transportation;Help with stairs or ramp for entrance   Equipment Recommendations  None recommended by PT (defer to post acute)    Recommendations for Other Services       Precautions / Restrictions Precautions Precautions:  Fall Precaution Comments: L posterior hip precautons, watch BP, R knee buckle Restrictions Weight Bearing Restrictions: No     Mobility  Bed Mobility Overal bed mobility: Needs Assistance             General bed mobility comments: pt OOB in recliner at start and end of session    Transfers Overall transfer level: Needs assistance Equipment used: Rolling walker (2 wheels) Transfers: Sit to/from Stand, Bed to chair/wheelchair/BSC Sit to Stand: Mod assist, +2 physical assistance   Step pivot transfers: +2 physical assistance, Mod assist       General transfer comment: pt with R knee buckle and difficulty placing RUE on RW. cues for hand placement each time. completed x5 sit-stand per session. modA to steady with pivot to L    Ambulation/Gait Ambulation/Gait assistance: Mod assist, +2 physical assistance, +2 safety/equipment (+ chair follow) Gait Distance (Feet): 6 Feet (+ 20ft) Assistive device: Rolling walker (2 wheels) Gait Pattern/deviations: Step-through pattern, Decreased stride length, Decreased stance time - right, Decreased dorsiflexion - right, Decreased weight shift to right, Knee flexed in stance - right, Knee flexed in stance - left, Knees buckling Gait velocity: decreased Gait velocity interpretation: <1.31 ft/sec, indicative of household ambulator   General Gait Details: pt with short steps, difficulty advancing RLE and poor stability in stance. multiple episodes of R knee buckling and L knee buckling during gait. needed seated rest after ~6 ft. HA increased    Modified Rankin (Stroke Patients Only) Modified Rankin (Stroke Patients Only) Pre-Morbid Rankin Score: Slight disability Modified Rankin: Moderately severe disability     Balance Overall balance assessment: Needs assistance Sitting-balance support: Feet supported, Single extremity supported Sitting balance-Leahy  Scale: Fair Sitting balance - Comments: static sitting on BCS without assist, poor  ability to lean outside BOS   Standing balance support: Reliant on assistive device for balance, Bilateral upper extremity supported Standing balance-Leahy Scale: Poor Standing balance comment: dependent on BUE support and external assist                            Cognition Arousal/Alertness: Awake/alert Behavior During Therapy: WFL for tasks assessed/performed, Impulsive Overall Cognitive Status: Impaired/Different from baseline Area of Impairment: Following commands, Memory                     Memory: Decreased short-term memory Following Commands: Follows one step commands consistently, Follows one step commands with increased time       General Comments: pt able to answer questions and express awareness of safety. does use simplistic sentences and statements, reports this is due to speech difficulties. benefits from cues for mobility and technique safety as well as management of RLE        Exercises      General Comments General comments (skin integrity, edema, etc.): SBP elevated to 150-160s with pt reporting HA. explained permissive HTN and RN alerted      Pertinent Vitals/Pain Pain Assessment Pain Assessment: 0-10 Pain Score: 6  Faces Pain Scale: Hurts little more Pain Location: heacahe with mobility, 5 at baseline, 6 after mobility Pain Descriptors / Indicators: Headache, Throbbing Pain Intervention(s): Limited activity within patient's tolerance, Monitored during session, Repositioned     PT Goals (current goals can now be found in the care plan section) Acute Rehab PT Goals Patient Stated Goal: return home PT Goal Formulation: With patient/family Time For Goal Achievement: 01/10/23 Potential to Achieve Goals: Good Progress towards PT goals: Progressing toward goals    Frequency    Min 4X/week      PT Plan Current plan remains appropriate    Co-evaluation PT/OT/SLP Co-Evaluation/Treatment: Yes Reason for Co-Treatment:  Complexity of the patient's impairments (multi-system involvement);For patient/therapist safety;Necessary to address cognition/behavior during functional activity PT goals addressed during session: Mobility/safety with mobility;Balance;Proper use of DME;Strengthening/ROM OT goals addressed during session: ADL's and self-care      AM-PAC PT "6 Clicks" Mobility   Outcome Measure  Help needed turning from your back to your side while in a flat bed without using bedrails?: A Little Help needed moving from lying on your back to sitting on the side of a flat bed without using bedrails?: A Little Help needed moving to and from a bed to a chair (including a wheelchair)?: A Lot Help needed standing up from a chair using your arms (e.g., wheelchair or bedside chair)?: Total Help needed to walk in hospital room?: Total Help needed climbing 3-5 steps with a railing? : Total 6 Click Score: 11    End of Session Equipment Utilized During Treatment: Gait belt Activity Tolerance: Patient tolerated treatment well Patient left: in chair;with call bell/phone within reach;with family/visitor present Nurse Communication: Mobility status PT Visit Diagnosis: Unsteadiness on feet (R26.81);Hemiplegia and hemiparesis;Difficulty in walking, not elsewhere classified (R26.2);Apraxia (R48.2);Pain Hemiplegia - Right/Left: Right Hemiplegia - dominant/non-dominant: Dominant Hemiplegia - caused by: Cerebral infarction Pain - Right/Left: Left Pain - part of body: Hip (headache)     Time: 1610-9604 PT Time Calculation (min) (ACUTE ONLY): 28 min  Charges:  $Gait Training: 8-22 mins  Vickki Muff, PT, DPT   Acute Rehabilitation Department Office [phone] Secure Chat Communication Preferred   Ronnie Derby 12/28/2022, 11:11 AM

## 2022-12-28 NOTE — Progress Notes (Signed)
Occupational Therapy Treatment Patient Details Name: Tonya Boyd MRN: [id number] DOB: [birth year] Today's Date: 12/28/2022   History of present illness Pt is 67 yo female who presented to [facility] on 12/26/22 with R sided weakness and speech abnormalities, received TNK at 1245. PMH: CVA, CAD, CKD, asth,a. COPD, HTN, gout, Raynauds, recent L THA   OT comments  Patient continues to make steady progress towards goals in skilled OT session. Patient's session encompassed ADLs and functional mobility to increase overall activity tolerance and independence. Patient currently is unable to complete peri-care in standing, and is max A for lower body dressing. Patient min A of 2 to ambulate, with frequent R knee buckling and noted increased headache each time she stood (BP minimally elevated with RN notified after session). Patient also fatiguing after a few feet requiring seated rest breaks.  OT continues to highly recommend intensive rehab >3 hours. OT will continue to follow.    Recommendations for follow up therapy are one component of a multi-disciplinary discharge planning process, led by the attending physician.  Recommendations may be updated based on patient status, additional functional criteria and insurance authorization.    Assistance Recommended at Discharge Frequent or constant Supervision/Assistance  Patient can return home with the following  A lot of help with walking and/or transfers;A lot of help with bathing/dressing/bathroom;Assistance with cooking/housework;Help with stairs or ramp for entrance;Assist for transportation   Equipment Recommendations  None recommended by OT    Recommendations for Other Services      Precautions / Restrictions Precautions Precautions: Fall Precaution Comments: L posterior hip precautons, watch BP, R knee buckle Restrictions Weight Bearing Restrictions: No       Mobility Bed Mobility Overal bed mobility: Needs Assistance              General bed mobility comments: pt OOB in recliner at start and end of session    Transfers Overall transfer level: Needs assistance Equipment used: Rolling walker (2 wheels) Transfers: Sit to/from Stand, Bed to chair/wheelchair/BSC Sit to Stand: Mod assist, +2 physical assistance     Step pivot transfers: +2 physical assistance, Mod assist     General transfer comment: pt with R knee buckle and difficulty placing RUE on RW. cues for hand placement each time. completed x5 sit-stand per session. modA to steady with pivot to L     Balance Overall balance assessment: Needs assistance Sitting-balance support: Feet supported, Single extremity supported Sitting balance-Leahy Scale: Fair Sitting balance - Comments: static sitting on BCS without assist, poor ability to lean outside BOS   Standing balance support: Reliant on assistive device for balance, Bilateral upper extremity supported Standing balance-Leahy Scale: Poor Standing balance comment: dependent on BUE support and external assist                           ADL either performed or assessed with clinical judgement   ADL Overall ADL's : Needs assistance/impaired                     Lower Body Dressing: Maximal assistance;Sit to/from stand;Sitting/lateral leans Lower Body Dressing Details (indicate cue type and reason): unable to don and doff mesh underwear without assist Toilet Transfer: Minimal assistance;Rolling walker (2 wheels);+2 for physical assistance;+2 for safety/equipment   Toileting- Clothing Manipulation and Hygiene: Total assistance;Sit to/from stand Toileting - Clothing Manipulation Details (indicate cue type and reason): unable to coordinate     Functional mobility during ADLs: Minimal assistance;+2  for physical assistance;+2 for safety/equipment;Cueing for sequencing;Cueing for safety;Rolling walker (2 wheels) General ADL Comments: Session focus in ADLs and increasing overall activity  tolerance while completing functional tasks.    Extremity/Trunk Assessment              Vision       Perception     Praxis      Cognition Arousal/Alertness: Awake/alert Behavior During Therapy: WFL for tasks assessed/performed, Impulsive Overall Cognitive Status: Impaired/Different from baseline Area of Impairment: Following commands, Memory                     Memory: Decreased short-term memory Following Commands: Follows one step commands consistently, Follows one step commands with increased time       General Comments: pt able to answer questions and express awareness of safety. does use simplistic sentences and statements, reports this is due to speech difficulties. benefits from cues for mobility and technique safety as well as management of RLE        Exercises      Shoulder Instructions       General Comments SBP elevated to 150-160s with pt reporting HA. explained permissive HTN and RN alerted    Pertinent Vitals/ Pain       Pain Assessment Pain Assessment: 0-10 Pain Score: 6  Faces Pain Scale: Hurts little more Pain Location: heacahe with mobility, 5 at baseline, 6 after mobility Pain Descriptors / Indicators: Headache, Throbbing Pain Intervention(s): Limited activity within patient's tolerance, Monitored during session, Repositioned  Home Living                                          Prior Functioning/Environment              Frequency  Min 2X/week        Progress Toward Goals  OT Goals(current goals can now be found in the care plan section)  Progress towards OT goals: Progressing toward goals  Acute Rehab OT Goals Patient Stated Goal: to get to rehab OT Goal Formulation: With patient/family Time For Goal Achievement: 01/08/23 Potential to Achieve Goals: Good  Plan Discharge plan remains appropriate    Co-evaluation      Reason for Co-Treatment: Complexity of the patient's impairments  (multi-system involvement);For patient/therapist safety;Necessary to address cognition/behavior during functional activity PT goals addressed during session: Mobility/safety with mobility;Balance;Proper use of DME;Strengthening/ROM OT goals addressed during session: ADL's and self-care      AM-PAC OT "6 Clicks" Daily Activity     Outcome Measure   Help from another person eating meals?: A Little Help from another person taking care of personal grooming?: A Little Help from another person toileting, which includes using toliet, bedpan, or urinal?: A Lot Help from another person bathing (including washing, rinsing, drying)?: A Lot Help from another person to put on and taking off regular upper body clothing?: A Lot Help from another person to put on and taking off regular lower body clothing?: A Lot 6 Click Score: 14    End of Session Equipment Utilized During Treatment: Gait belt;Rolling walker (2 wheels)  OT Visit Diagnosis: Unsteadiness on feet (R26.81);Muscle weakness (generalized) (M62.81);Pain;Hemiplegia and hemiparesis Hemiplegia - Right/Left: Right Hemiplegia - dominant/non-dominant: Non-Dominant Hemiplegia - caused by: Other cerebrovascular disease Pain - Right/Left: Left Pain - part of body: Hip   Activity Tolerance Patient tolerated treatment well;Other (comment) (reporting increased  headache in standing)   Patient Left in chair;with call bell/phone within reach;with chair alarm set   Nurse Communication Mobility status;Other (comment) (increased headache in standing)        Time: 8295-6213 OT Time Calculation (min): 28 min  Charges: OT General Charges $OT Visit: 1 Visit OT Treatments $Self Care/Home Management : 8-22 mins  Pollyann Glen E. Marlow Berenguer, OTR/L Acute Rehabilitation Services [phone]   Cherlyn Cushing 12/28/2022, 1:20 PM

## 2022-12-28 NOTE — Progress Notes (Signed)
SLP Cancellation Note  Patient Details Name: CELLA CAPPELLO MRN: [id number] DOB: [birth year]   Cancelled treatment:       Reason Eval/Treat Not Completed: Patient at procedure or test/unavailable. Pt getting procedure   Lavender Stanke, Riley Nearing 12/28/2022, 1:55 PM

## 2022-12-29 DIAGNOSIS — I639 Cerebral infarction, unspecified: Secondary | ICD-10-CM | POA: Diagnosis not present

## 2022-12-29 DIAGNOSIS — F449 Dissociative and conversion disorder, unspecified: Secondary | ICD-10-CM | POA: Diagnosis not present

## 2022-12-29 LAB — CBG MONITORING, ED: Glucose-Capillary: 124 mg/dL — ABNORMAL HIGH (ref 70–99)

## 2022-12-29 MED ORDER — ONDANSETRON HCL 4 MG/2ML IJ SOLN
4.0000 mg | Freq: Three times a day (TID) | INTRAMUSCULAR | Status: DC
  Administered 2022-12-30: 4 mg via INTRAVENOUS
  Filled 2022-12-29 (×2): qty 2

## 2022-12-29 NOTE — Progress Notes (Addendum)
STROKE TEAM PROGRESS NOTE   INTERVAL HISTORY Husband is at bedside.  Patient is awake and alert in no apparent distress.  No acute events overnight, speech has improved. Pending insurance auth/CIR bed for discharge.   Vitals:   12/29/22 0006 12/29/22 0407 12/29/22 0724 12/29/22 1112  BP: 118/62 134/60 135/61 135/74  Pulse: 87 85 93 91  Resp: 17 19 17 17   Temp: 97.7 F (36.5 C) 97.6 F (36.4 C) 98.3 F (36.8 C) 97.9 F (36.6 C)  TempSrc: Oral Oral Oral Oral  SpO2: 97% 100% 98% 98%  Weight:      Height:       CBC:  Recent Labs  Lab 12/26/22 1212 12/26/22 1222  WBC 6.9  --   NEUTROABS 4.0  --   HGB 12.4 13.3  HCT 38.0 39.0  MCV 98.4  --   PLT 333  --     Basic Metabolic Panel:  Recent Labs  Lab 12/26/22 1212 12/26/22 1222  NA 137 139  K 4.5 4.6  CL 101 100  CO2 27  --   GLUCOSE 107* 105*  BUN 22 26*  CREATININE 1.08* 1.10*  CALCIUM 8.9  --     Lipid Panel:  Recent Labs  Lab 12/27/22 0114  CHOL 125  TRIG 131  HDL 49  CHOLHDL 2.6  VLDL 26  LDLCALC 50    HgbA1c:  Recent Labs  Lab 12/26/22 1601  HGBA1C 5.2    Urine Drug Screen:  Recent Labs  Lab 12/26/22 1258  LABOPIA NONE DETECTED  COCAINSCRNUR NONE DETECTED  LABBENZ NONE DETECTED  AMPHETMU NONE DETECTED  THCU NONE DETECTED  LABBARB NONE DETECTED     Alcohol Level  Recent Labs  Lab 12/26/22 1212  ETH <10     IMAGING past 24 hours ECHOCARDIOGRAM COMPLETE  Result Date: 12/28/2022    ECHOCARDIOGRAM REPORT   Patient Name:   Tonya Boyd Date of Exam: 12/28/2022 Medical Rec #:  [id number]        Height:       64.0 in Accession #:    [id number]       Weight:       219.7 lb Date of Birth:  June 08, 1956        BSA:          2.036 m Patient Age:    66 years         BP:           154/138 mmHg Patient Gender: F                HR:           93 bpm. Exam Location:  Inpatient Procedure: 2D Echo, Cardiac Doppler and Color Doppler Indications:    Stroke I63.9  History:        Patient has prior  history of Echocardiogram examinations, most                 recent 11/01/2020. CAD, Stroke; Risk Factors:Dyslipidemia and                 Non-Smoker.  Sonographer:    Dondra Prader RVT RCS Referring Phys: [phone] ERIC LINDZEN  Sonographer Comments: Technically challenging study due to limited acoustic windows. IMPRESSIONS  1. Left ventricular ejection fraction, by estimation, is 60 to 65%. The left ventricle has normal function. The left ventricle has no regional wall motion abnormalities. There is mild concentric left ventricular hypertrophy. Left ventricular diastolic parameters were normal.  2. Right ventricular systolic function is normal. The right ventricular size is normal.  3. The mitral valve is normal in structure. Trivial mitral valve regurgitation. No evidence of mitral stenosis.  4. The aortic valve is tricuspid. There is mild calcification of the aortic valve. Aortic valve regurgitation is not visualized. Aortic valve sclerosis/calcification is present, without any evidence of aortic stenosis.  5. The inferior vena cava is normal in size with greater than 50% respiratory variability, suggesting right atrial pressure of 3 mmHg.  6. Descending aorta appears dilated on limited imaging. Consider dedicated abdominal u/s to further evalaute. FINDINGS  Left Ventricle: Left ventricular ejection fraction, by estimation, is 60 to 65%. The left ventricle has normal function. The left ventricle has no regional wall motion abnormalities. The left ventricular internal cavity size was normal in size. There is  mild concentric left ventricular hypertrophy. Left ventricular diastolic parameters were normal. Right Ventricle: The right ventricular size is normal. No increase in right ventricular wall thickness. Right ventricular systolic function is normal. Left Atrium: Left atrial size was normal in size. Right Atrium: Right atrial size was normal in size. Pericardium: There is no evidence of pericardial effusion. Mitral  Valve: The mitral valve is normal in structure. Trivial mitral valve regurgitation. No evidence of mitral valve stenosis. Tricuspid Valve: The tricuspid valve is normal in structure. Tricuspid valve regurgitation is trivial. No evidence of tricuspid stenosis. Aortic Valve: The aortic valve is tricuspid. There is mild calcification of the aortic valve. Aortic valve regurgitation is not visualized. Aortic valve sclerosis/calcification is present, without any evidence of aortic stenosis. Aortic valve mean gradient measures 3.0 mmHg. Aortic valve peak gradient measures 5.4 mmHg. Aortic valve area, by VTI measures 2.03 cm. Pulmonic Valve: The pulmonic valve was normal in structure. Pulmonic valve regurgitation is not visualized. No evidence of pulmonic stenosis. Aorta: The aortic root is normal in size and structure. Venous: The inferior vena cava is normal in size with greater than 50% respiratory variability, suggesting right atrial pressure of 3 mmHg. IAS/Shunts: No atrial level shunt detected by color flow Doppler.  LEFT VENTRICLE PLAX 2D LVIDd:         4.70 cm LVIDs:         3.35 cm LV PW:         1.15 cm LV IVS:        1.05 cm LVOT diam:     2.00 cm LV SV:         48 LV SV Index:   24 LVOT Area:     3.14 cm  RIGHT VENTRICLE             IVC RV Basal diam:  3.00 cm     IVC diam: 1.60 cm RV Mid diam:    2.70 cm RV S prime:     19.80 cm/s TAPSE (M-mode): 2.2 cm LEFT ATRIUM             Index        RIGHT ATRIUM           Index LA diam:        3.80 cm 1.87 cm/m   RA Area:     10.00 cm LA Vol (A2C):   36.8 ml 18.07 ml/m  RA Volume:   20.70 ml  10.17 ml/m LA Vol (A4C):   27.6 ml 13.56 ml/m LA Biplane Vol: 32.3 ml 15.86 ml/m  AORTIC VALVE  PULMONIC VALVE AV Area (Vmax):    2.32 cm     PV Vmax:       0.90 m/s AV Area (Vmean):   2.20 cm     PV Peak grad:  3.2 mmHg AV Area (VTI):     2.03 cm AV Vmax:           116.00 cm/s AV Vmean:          81.700 cm/s AV VTI:            0.238 m AV Peak Grad:       5.4 mmHg AV Mean Grad:      3.0 mmHg LVOT Vmax:         85.70 cm/s LVOT Vmean:        57.200 cm/s LVOT VTI:          0.154 m LVOT/AV VTI ratio: 0.65  AORTA Ao Root diam: 3.20 cm Ao Asc diam:  4.50 cm Ao Arch diam: 2.9 cm MITRAL VALVE MV Area (PHT): 3.39 cm    SHUNTS MV Decel Time: 224 msec    Systemic VTI:  0.15 m MV E velocity: 70.10 cm/s  Systemic Diam: 2.00 cm MV A velocity: 89.10 cm/s MV E/A ratio:  0.79 Arvilla Meres MD Electronically signed by Arvilla Meres MD Signature Date/Time: 12/28/2022/2:18:31 PM    Final     PHYSICAL EXAM  Temp:  [97.5 F (36.4 C)-98.3 F (36.8 C)] 97.9 F (36.6 C) (04/23 1112) Pulse Rate:  [81-93] 91 (04/23 1112) Resp:  [13-19] 17 (04/23 1112) BP: (118-154)/(59-138) 135/74 (04/23 1112) SpO2:  [95 %-100 %] 98 % (04/23 1112)  General - Well nourished, well developed, in no apparent distress. Cardiovascular - Regular rhythm and rate.  Mental Status -  Level of arousal and orientation to time, place, and person were intact.  Childlike speech, no aphasia or dysarthria which appears variable with distraction--improved from yesterday. Language including expression, naming, repetition, comprehension was assessed and found intact. Attention span and concentration were normal. Recent and remote memory were intact. Fund of Knowledge was assessed and was intact.  Cranial Nerves II - XII - II - Visual field intact OU. III, IV, VI - Extraocular movements intact. V - Facial sensation intact bilaterally. VII - Facial movement intact bilaterally. VIII - Hearing & vestibular intact bilaterally. X - Palate elevates symmetrically. XI - Chin turning & shoulder shrug intact bilaterally. XII - Tongue protrusion intact.  Motor Strength -bilateral uppers equal strength with no drift, left leg 3 out of 5 (has had hip surgery a month ago), right leg 4/5. Motor Tone - Muscle tone was assessed at the neck and appendages and was normal. Sensory - subjective decreased to  right side noted Coordination - The patient had normal movements in the hands and feet with no ataxia or dysmetria.  Tremor was absent.  Gait and Station - deferred.  ASSESSMENT/PLAN Tonya Boyd is a 67 y.o. female with history of CAD, CKD, COPD, depression and anxiety, hypertension, hyperlipidemia, migraines, strokelike episodes, GERD, CHF, Raynaud's disease, who presented to [facility] for acute onset of right-sided weakness and speech difficulties.  NIH was 12 at [facility] and she was given TNK and transferred to [facility] for further workup  Strokelike symptoms ?due to complicated migraine status post TNK  Code Stroke  CT head No acute abnormality. ASPECTS 10.    CTA head & neck no LVO MRI no acute process 2D Echo pending LDL 50 HgbA1c 5.2  VTE prophylaxis -SCDs    Diet   Diet Heart Room service appropriate? Yes with Assist; Fluid consistency: Thin   325 twice daily prior to admission, now on aspirin 81.  Therapy recommendations: CIR Disposition: Pending  Hypertension Home meds: Imdur 30, losartan 25 mg Stable Long-term BP goal normotensive  Hyperlipidemia Home meds: Repatha, not resumed in hospital LDL 50, goal < 70 Continue statin at discharge  Migraines Continue Depakote, increased to home dose of 500 mg daily  Other Stroke Risk Factors Advanced Age >/= 31  Obesity, Body mass index is 37.71 kg/m., BMI >/= 30 associated with increased stroke risk, recommend weight loss, diet and exercise as appropriate  Coronary artery disease Migraines Congestive heart failure  Other Active Problems COPD GERD Insomnia  Hospital day # 3   Pt seen by Neuro NP/APP and later by MD. Note/plan to be edited by MD as needed.    Lynnae January, DNP, AGACNP-BC [facility] Please use AMION for contact information & EPIC for messaging. I have personally obtained history,examined this patient, reviewed notes, independently viewed imaging studies,  participated in medical decision making and plan of care.ROS completed by me personally and pertinent positives fully documented  I have made any additions or clarifications directly to the above note. Agree with note above.  Patient states her speech is improving.  She still has subjective decrease sensation on the right.  Therapy recommends inpatient rehab.  Medically stable to be transferred to rehab when bed available.  Discussed with patient and husband and answered questions.  Delia Heady, MD Medical Director [facility] Pager: [phone] 12/29/2022 1:51 PM   To contact Stroke Continuity provider, please refer to [email or web address]. After hours, contact General Neurology

## 2022-12-29 NOTE — Plan of Care (Signed)
  Problem: Education: Goal: Knowledge of disease or condition will improve Outcome: Progressing Goal: Knowledge of secondary prevention will improve (MUST DOCUMENT ALL) Outcome: Progressing Goal: Knowledge of patient specific risk factors will improve (Mark N/A or DELETE if not current risk factor) Outcome: Progressing   Problem: Ischemic Stroke/TIA Tissue Perfusion: Goal: Complications of ischemic stroke/TIA will be minimized Outcome: Progressing   

## 2022-12-29 NOTE — NC FL2 (Addendum)
[place] MEDICAID FL2 LEVEL OF CARE FORM     IDENTIFICATION  Patient Name: Tonya Boyd Birthdate: [birth year] Sex: female Admission Date (Current Location): 12/26/2022  [facility] and [place] Number:  [employer] and Address:  [facility] [place], 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   Severe persistent asthma with status asthmaticus    Acute respiratory failure with hypoxia 04/02/2022   Asthma, chronic, unspecified asthma severity, with acute exacerbation 04/02/2022   Migraine headache 04/02/2022   GERD (gastroesophageal reflux disease) 04/02/2022   Hyperlipidemia 04/02/2022   Raynaud's disease without gangrene 10/15/2018   Angina pectoris 10/07/2018   Essential hypertension 08/03/2018   CAD (coronary artery disease), native coronary artery 08/03/2018   Post PTCA 08/02/2018   Elevated IgE level 07/19/2018   Dyspnea on exertion 07/19/2018   Long-term exposure involving bird droppings 07/19/2018   Iliotibial band syndrome of left side 10/21/2011    Orientation RESPIRATION BLADDER Height & Weight     Self, Time, Situation, Place  Normal Continent Weight: 219 lb 11.2 oz (99.7 kg) Height:   (162.6 cm)  BEHAVIORAL SYMPTOMS/MOOD NEUROLOGICAL BOWEL NUTRITION STATUS      Continent Diet (heart healthy)  AMBULATORY STATUS COMMUNICATION OF NEEDS Skin   Limited Assist Verbally Normal                        Personal Care Assistance Level of Assistance  Bathing, Feeding, Dressing Bathing Assistance: Limited assistance Feeding assistance: Independent Dressing Assistance: Limited assistance     Functional Limitations Warehouse manager, Speech Sight Info: Impaired   Speech Info: Impaired    SPECIAL CARE FACTORS FREQUENCY  PT (By licensed PT), OT (By licensed OT)     PT Frequency: 5x/wk OT Frequency: 5x/wk            Contractures Contractures Info: Not present    Additional Factors Info  Code Status, Allergies Code Status Info: Full Allergies Info: Adhesive (Tape), Brilinta (Ticagrelor), Statins, Carvedilol, Diovan (Valsartan), Hydrochlorothiazide, Ibuprofen, Keflex (Cephalexin), Morphine And Related, Norvasc (Amlodipine Besylate), Nsaids, Prednisone, Reglan (Metoclopramide), Singulair (Montelukast), Temazepam           Current Medications (12/29/2022):  This is the current hospital active medication list Current Facility-Administered Medications  Medication Dose Route Frequency Provider Last Rate Last Admin   acetaminophen (TYLENOL) tablet 650 mg  650 mg Oral Q4H PRN Caryl Pina, MD   650 mg at 12/28/22 1320   Or   acetaminophen (TYLENOL) 160 MG/5ML solution 650 mg  650 mg Per Tube Q4H PRN Caryl Pina, MD       Or   acetaminophen (TYLENOL) suppository 650 mg  650 mg Rectal Q4H PRN Caryl Pina, MD       aspirin chewable tablet 81 mg  81 mg Oral Daily Gevena Mart A, NP   81 mg at 12/29/22 [phone]  cyanocobalamin (VITAMIN B12) injection 1,000 mcg  1,000 mcg Subcutaneous Q30 days Caryl Pina, MD   1,000 mcg at 12/27/22 0057   divalproex (DEPAKOTE ER) 24 hr tablet 500 mg  500 mg Oral q morning Hetty Blend C, NP   500 mg at 12/29/22 0906   enoxaparin (LOVENOX) injection 40 mg  40 mg Subcutaneous Daily Dang, Thuy D, RPH   40 mg at 12/29/22 1610   hydrOXYzine (ATARAX) tablet 25 mg  25 mg Oral QID PRN Caryl Pina, MD       labetalol (NORMODYNE)  injection 10 mg  10 mg Intravenous Q10 min PRN Wynell Balloon, NP       ondansetron [facility]) injection 4 mg  4 mg Intravenous Q8H Hetty Blend C, NP       Oral care mouth rinse  15 mL Mouth Rinse PRN Rejeana Brock, MD       oxyCODONE (Oxy IR/ROXICODONE) immediate release tablet 5 mg  5 mg Oral Q8H PRN Leanord Hawking F, NP   5 mg at 12/28/22 2310   pantoprazole (PROTONIX) EC tablet 40 mg  40 mg Oral Q supper Ilda Basset, [place]   40 mg at 12/28/22 1654   senna-docusate (Senokot-S) tablet 1 tablet  1 tablet Oral QHS PRN Caryl Pina, MD       tiZANidine (ZANAFLEX) tablet 4 mg  4 mg Oral Q8H PRN Milon Dikes, MD   4 mg at 12/27/22 2228   traMADol (ULTRAM) tablet 50 mg  50 mg Oral Q6H PRN Milon Dikes, MD   50 mg at 12/29/22 0234   traZODone (DESYREL) tablet 100 mg  100 mg Oral QHS Milon Dikes, MD   100 mg at 12/28/22 2310     Discharge Medications: Please see discharge summary for a list of discharge medications.  Relevant Imaging Results:  Relevant Lab Results:   Additional Information SS#: [id number]  Baldemar Lenis, LCSW   I have personally obtained history,examined this patient, reviewed notes, independently viewed imaging studies, participated in medical decision making and plan of care.ROS completed by me personally and pertinent positives fully documented  I have made any additions or clarifications directly to the above note. Agree with note above.    Delia Heady, MD Medical Director [facility] St[facility] Pager: [phone] 12/29/2022 4:40 PM

## 2022-12-29 NOTE — Consult Note (Signed)
Physical Medicine and Rehabilitation Consult Reason for Consult:right sided weakness and speech difficulties Referring Physician: Pearlean Brownie   HPI: Tonya Boyd is a 67 y.o. female with hx of CAD, CKD who presented on 4/20 to [facility] with right sided weakness and speech difficulties. NIH was 12 and pt was given TNK and transferred to [facility] for further work up. CT, CTA, MRI negative for acute process or occlusion. Pt has history of migraines and neurology feels symptoms might be due to complicated migraine.  She was placed on home dose of depakote 500mg  daily. Pt was up with therapy yesterday and was mod assist +2 for sit-std transfers and walked 6+ft with mod assist +2 using RW. Pt demonstrated poor stability in stance with frequent right knee buckling. Experienced increased headache with activity as well. PTA pt was using a RW and SPC for ambulation at home and in the community. She was independent for ADL's   Review of Systems  Constitutional:  Negative for fever.  HENT:  Negative for hearing loss.   Eyes:  Positive for blurred vision and double vision.  Respiratory:  Negative for cough.   Cardiovascular:  Negative for chest pain.  Gastrointestinal:  Negative for nausea and vomiting.  Genitourinary:  Negative for dysuria.  Musculoskeletal:  Positive for joint pain. Negative for myalgias.  Skin:  Negative for rash.  Neurological:  Positive for focal weakness.  Psychiatric/Behavioral:  The patient is nervous/anxious.    Past Medical History:  Diagnosis Date   Angina pectoris    Anxiety    Asthma    CAD (coronary artery disease), native coronary artery 08/02/2018   a.) [facility] 08/02/2018: EF 45%, super-dominant large RCA, 80% p-mRCA, 50% oRPDA, 80% D1; mPA 13, mPCWP 8, PA sat 81%, Ao sat 99, CO 6.95, CI 3.65 --> 4.0 x 30 mm Orsiro DES x1 to [facility]; b.) [facility] 10/07/2018: 50-60% oD1 (FFR 0.96), diffuse HG stenosis prox seg very small (<1 mm) RI --> med mgmt; c.)  [facility] 04/08/2021: 80% pRCA (4.0 x 16 mm Synergy XD DES) and 80% oPDA (2.5 x 12 mm Synergy XD DES)   Chronic bronchitis    Chronic kidney disease    Complication of anesthesia    a.) postoperative nausea only (no vomiting); also "shakes"; per patient relieved w/ IV diphenhydramine   COPD (chronic obstructive pulmonary disease)    Depression    Diastolic dysfunction 07/23/2018   a.) TTE 07/23/2018: EF 55-60%, GLS -19.6%, G1DD; b.) TTE 11/01/2020: EF 55%, triv AR, G1DD   Family history of adverse reaction to anesthesia    "cousin stopped breathing; he was allergic to the anesthesia" (08/02/2018)   GERD (gastroesophageal reflux disease)    High cholesterol    History of gout    History of hiatal hernia    History of kidney stones    Hypertension 07/2018   Interstitial cystitis    Long term current use of antithrombotics/antiplatelets    a.) daily DAPT therapy (ASA + clopidogrel)   Migraines    NSVT (nonsustained ventricular tachycardia) 07/2018   a.) holter 07/13/2018: 6 beat run NSVT; maximum rate 133 bpm.   OA (osteoarthritis)    PONV (postoperative nausea and vomiting)    Raynaud's disease    Stroke-like episode 06/08/2019   a.) speech difficulty, right sided weakness/paraesthesias; unclear etiology ?? possibly conversion disorder. MRI brain x 2 negative for stroke.   Past Surgical History:  Procedure Laterality Date   ABDOMINAL HYSTERECTOMY  ANTERIOR CERVICAL DECOMP/DISCECTOMY FUSION     APPENDECTOMY     AUGMENTATION MAMMAPLASTY Bilateral    BACK SURGERY     CATARACT EXTRACTION, BILATERAL Bilateral    CORONARY ANGIOPLASTY WITH STENT PLACEMENT  08/02/2018   CORONARY PRESSURE/FFR STUDY N/A 10/07/2018   Procedure: INTRAVASCULAR PRESSURE WIRE/FFR STUDY;  Surgeon: [name], MD;  Location: [facility] INVASIVE CV LAB;  Service: Cardiovascular;  Laterality: N/A;   CORONARY STENT INTERVENTION N/A 08/02/2018   Procedure: CORONARY STENT INTERVENTION;  Surgeon: [name], MD;  Location: [facility]  INVASIVE CV LAB;  Service: Cardiovascular;  Laterality: N/A;  RCA    CORONARY STENT INTERVENTION N/A 04/08/2021   Procedure: CORONARY STENT INTERVENTION;  Surgeon: [name], MD;  Location: [facility] INVASIVE CV LAB;  Service: Cardiovascular;  Laterality: N/A;  RCA and PDA   CYSTOSCOPY W/ STONE MANIPULATION     KNEE ARTHROSCOPY Right    LAPAROSCOPIC CHOLECYSTECTOMY     LEFT HEART CATH AND CORONARY ANGIOGRAPHY N/A 10/07/2018   Procedure: LEFT HEART CATH AND CORONARY ANGIOGRAPHY;  Surgeon: [name], MD;  Location: [facility] INV[facility] CV [facility];  Service: Cardiovascular;  Laterality: N/A;   LEFT HEART CATH AND CORONARY ANGIOGRAPHY N/A 04/08/2021   Procedure: LEFT HEART CATH AND CORONARY ANGIOGRAPHY;  Surgeon: [name], MD;  Location: [facility] CV [facility];  Service: Cardiovascular;  Laterality: N/A;   LEFT HEART CATH AND CORONARY ANGIOGRAPHY N/A 09/04/2022   Procedure: LEFT HEART CATH AND CORONARY ANGIOGRAPHY;  Surgeon: [name], MD;  Location: [facility] IN[facility] CV [facility];  Service: Cardiovascular;  Laterality: N/A;   REPAIR ANKLE LIGAMENT Left    "tied up ligament; had tore up qthing in my ankle"   REPLACEMENT TOTAL KNEE Right    RIGHT/LEFT HEART CATH AND CORONARY ANGIOGRAPHY N/A 08/02/2018   Procedure: RIGHT/LEFT HEART CATH AND CORONARY ANGIOGRAPHY;  Surgeon: [name], MD;  Location: [facility];  Service: Cardiovascular;  Laterality: N/A;   SHOULDER SURGERY Bilateral    TOTAL HIP ARTHROPLASTY Left 11/24/2022   Procedure: TOTAL HIP ARTHROPLASTY;  Surgeon: Christena Flake, MD;  Location: [facility] ORS;  Service: Orthopedics;  Laterality: Left;   TOTAL KNEE ARTHROPLASTY Right 05/12/2022   Procedure: TOTAL KNEE ARTHROPLASTY;  Surgeon: Christena Flake, MD;  Location: [facility] ORS;  Service: Orthopedics;  Laterality: Right;   TUBAL LIGATION     Family History  Problem Relation Age of Onset   Kidney failure Mother    Diabetes Mother    Heart disease Father    Heart attack Father    Colon cancer Brother    Liver cancer  Brother    Heart disease Other    Arthritis Other    Cancer Other    Asthma Other    Diabetes Other    Kidney disease Other    Social History:  reports that she has never smoked. She has never used smokeless tobacco. She reports that she does not currently use alcohol. She reports that she does not use drugs. Allergies:  Allergies  Allergen Reactions   Adhesive [Tape] Other (See Comments)    SKIN WILL TEAR EASILY!!!!   Brilinta [Ticagrelor] Shortness Of Breath   Statins Other (See Comments)    myalgia   Carvedilol     GI upset   Diovan [Valsartan]     Tingling    Hydrochlorothiazide     Urinary frequency   Ibuprofen     Causes bp to go up   Keflex [Cephalexin]     confusion   Morphine And  Related     Hyper, ineffective    Norvasc [Amlodipine Besylate]     Numbness and tingling    Nsaids     Esophagus became red and swollen (tolerates meloxicam)    Prednisone     Elevated BP   Reglan [Metoclopramide]     Confusion, altered mental status   Singulair [Montelukast] Diarrhea   Temazepam Hives   Medications Prior to Admission  Medication Sig Dispense Refill   acebutolol (SECTRAL) 200 MG capsule Take 1 capsule (200 mg total) by mouth 2 (two) times daily. 180 capsule 3   acetaminophen (TYLENOL) 500 MG tablet Take 2 tablets (1,000 mg total) by mouth every 6 (six) hours. 30 tablet 0   aspirin 325 MG tablet Take 325 mg by mouth 2 (two) times daily.     cyanocobalamin (,VITAMIN B-12,) 1000 MCG/ML injection Inject 1,000 mcg into the skin every 30 (thirty) days.     cyclobenzaprine (FLEXERIL) 10 MG tablet Take 10 mg by mouth at bedtime.     divalproex (DEPAKOTE ER) 250 MG 24 hr tablet Take 1 tablet (250 mg total) by mouth daily. (Patient taking differently: Take 250 mg by mouth every morning.) 90 tablet 3   Evolocumab (REPATHA SURECLICK) 140 MG/ML SOAJ INJECT 140 MG INTO THE SKIN EVERY 2 WEEKS. 2 mL 3   hydrOXYzine (ATARAX/VISTARIL) 25 MG tablet Take 25 mg by mouth 4 (four) times  daily as needed for itching.     isosorbide mononitrate (IMDUR) 30 MG 24 hr tablet Take 1 tablet (30 mg total) by mouth daily. 90 tablet 3   loratadine (CLARITIN) 10 MG tablet Take 10 mg by mouth at bedtime.     losartan (COZAAR) 25 MG tablet Take 1 tablet (25 mg total) by mouth every evening. 90 tablet 3   nitroGLYCERIN (NITROSTAT) 0.4 MG SL tablet DISSOLVE 1 TABLET SUBLINGUALLY AS NEEDED FOR CHEST PAIN, MAY REPEAT EVERY 5 MINUTES. AFTER 3 CALL 911. (Patient taking differently: Place 0.4 mg under the tongue every 5 (five) minutes as needed for chest pain.) 25 tablet 0   ondansetron (ZOFRAN) 4 MG tablet Take 1 tablet (4 mg total) by mouth every 6 (six) hours as needed for nausea. 30 tablet 0   oxyCODONE (OXY IR/ROXICODONE) 5 MG immediate release tablet Take 1-2 tablets (5-10 mg total) by mouth every 4 (four) hours as needed for severe pain. 40 tablet 0   pantoprazole (PROTONIX) 20 MG tablet Take 1 tablet (20 mg total) by mouth daily. 90 tablet 3   SYMBICORT 80-4.5 MCG/ACT inhaler Inhale 2 puffs into the lungs daily as needed (Coughing).     tiZANidine (ZANAFLEX) 4 MG tablet Take 4 mg by mouth 3 (three) times daily as needed.     traMADol (ULTRAM) 50 MG tablet Take 1 tablet (50 mg total) by mouth every 6 (six) hours as needed (Breakthrough pain). 30 tablet 0   traZODone (DESYREL) 50 MG tablet Take 100 mg by mouth at bedtime.     Vitamin D, Ergocalciferol, (DRISDOL) 1.25 MG (50000 UT) CAPS capsule Take 50,000 Units by mouth every Monday.     apixaban (ELIQUIS) 2.5 MG TABS tablet Take 1 tablet (2.5 mg total) by mouth 2 (two) times daily. (Patient not taking: Reported on 12/26/2022) 30 tablet 0    Home: Home Living Family/patient expects to be discharged to:: Private residence Living Arrangements: Spouse/significant other, Other (Comment) (SIL flying in from [place] to help) Available Help at Discharge: Family, Available 24 hours/day Type of Home: House Home Access:  Stairs to enter [employer] of Steps: 4 Entrance Stairs-Rails: Right Home Layout: Two level, Able to live on main level with bedroom/bathroom Bathroom Shower/Tub: Engineer, manufacturing systems: Standard Bathroom Accessibility: Yes Home Equipment: Agricultural consultant (2 wheels), [employer] - single point, BSC/3in1, Information systems manager, Mudlogger: Reacher, Sock aid  Functional History: Prior Function Prior Level of Function : Independent/Modified Independent Mobility Comments: Using 4WRW in the community and SPC in the home ADLs Comments: Ind with ADLs/IADLs, does not drive Functional Status:  Mobility: Bed Mobility Overal bed mobility: Needs Assistance Bed Mobility: Supine to Sit Supine to sit: Supervision, HOB elevated, Min guard General bed mobility comments: pt OOB in recliner at start and end of session Transfers Overall transfer level: Needs assistance Equipment used: Rolling walker (2 wheels) Transfers: Sit to/from Stand, Bed to chair/wheelchair/BSC Sit to Stand: Mod assist, +2 physical assistance Bed to/from chair/wheelchair/BSC transfer type:: Step pivot Step pivot transfers: +2 physical assistance, Mod assist General transfer comment: pt with R knee buckle and difficulty placing RUE on RW. cues for hand placement each time. completed x5 sit-stand per session. modA to steady with pivot to L Ambulation/Gait Ambulation/Gait assistance: Mod assist, +2 physical assistance, +2 safety/equipment (+ chair follow) Gait Distance (Feet): 6 Feet (+ 4ft) Assistive device: Rolling walker (2 wheels) Gait Pattern/deviations: Step-through pattern, Decreased stride length, Decreased stance time - right, Decreased dorsiflexion - right, Decreased weight shift to right, Knee flexed in stance - right, Knee flexed in stance - left, Knees buckling General Gait Details: pt with short steps, difficulty advancing RLE and poor stability in stance. multiple episodes of R knee buckling and L knee buckling  during gait. needed seated rest after ~6 ft. HA increased Gait velocity: decreased Gait velocity interpretation: <1.31 ft/sec, indicative of household ambulator    ADL: ADL Overall ADL's : Needs assistance/impaired Eating/Feeding: Set up, Sitting Grooming: Oral care, Minimal assistance, Sitting Upper Body Bathing: Minimal assistance, Sitting Lower Body Bathing: Maximal assistance, Sitting/lateral leans Upper Body Dressing : Moderate assistance, Sitting Lower Body Dressing: Maximal assistance, Sit to/from stand, Sitting/lateral leans Lower Body Dressing Details (indicate cue type and reason): unable to don and doff mesh underwear without assist Toilet Transfer: Minimal assistance, Rolling walker (2 wheels), +2 for physical assistance, +2 for safety/equipment Toileting- Clothing Manipulation and Hygiene: Total assistance, Sit to/from stand Toileting - Clothing Manipulation Details (indicate cue type and reason): unable to coordinate Functional mobility during ADLs: Minimal assistance, +2 for physical assistance, +2 for safety/equipment, Cueing for sequencing, Cueing for safety, Rolling walker (2 wheels) General ADL Comments: Session focus in ADLs and increasing overall activity tolerance while completing functional tasks.  Cognition: Cognition Overall Cognitive Status: Impaired/Different from baseline Orientation Level: Oriented X4 Cognition Arousal/Alertness: Awake/alert Behavior During Therapy: WFL for tasks assessed/performed, Impulsive Overall Cognitive Status: Impaired/Different from baseline Area of Impairment: Following commands, Memory Memory: Decreased short-term memory Following Commands: Follows one step commands consistently, Follows one step commands with increased time General Comments: pt able to answer questions and express awareness of safety. does use simplistic sentences and statements, reports this is due to speech difficulties. benefits from cues for mobility and  technique safety as well as management of RLE  Blood pressure 135/74, pulse 91, temperature 97.9 F (36.6 C), temperature source Oral, resp. rate 17, height 5\' 4"  (1.626 m), weight 99.7 kg, SpO2 98 %. Physical Exam Constitutional:      Appearance: She is obese.  HENT:     Head: Normocephalic.     Nose: Nose  normal.     Mouth/Throat:     Mouth: Mucous membranes are moist.  Eyes:     Extraocular Movements: Extraocular movements intact.     Conjunctiva/sclera: Conjunctivae normal.     Pupils: Pupils are equal, round, and reactive to light.  Cardiovascular:     Rate and Rhythm: Normal rate.  Pulmonary:     Effort: Pulmonary effort is normal.  Abdominal:     Palpations: Abdomen is soft.  Musculoskeletal:     Cervical back: Normal range of motion.     Comments: Left hip pain. Limited hip flexion   Skin:    General: Skin is warm.     Comments: Prior surgical scars  Neurological:     Comments: Alert and oriented x 3. Normal insight and awareness. Intact Memory. Speaks with "baby talk". Cranial nerve exam unremarkable. Seems to have issues with depth perception and past pointing on FTN testing. MMT: 5/5 in UE's. RLE 4+/5, LLE 4+/5 with some inhibition d/t left hip. Subjective decreased in sensory to LT on right leg more so than right arm. No abnl tone. DTR's 1+    Psychiatric:     Comments: Generally pleasant but anxious.      No results found for this or any previous visit (from the past 24 hour(s)). ECHOCARDIOGRAM COMPLETE  Result Date: 12/28/2022    ECHOCARDIOGRAM REPORT   Patient Name:   Mikaella A Lehnert Date of Exam: 12/28/2022 Medical Rec #:  [id number]        Height:       64.0 in Accession #:    [id number]       Weight:       219.7 lb Date of Birth:  1956/09/05        BSA:          2.036 m Patient Age:    66 years         BP:           154/138 mmHg Patient Gender: F                HR:           93 bpm. Exam Location:  Inpatient Procedure: 2D Echo, Cardiac Doppler and Color Doppler  Indications:    Stroke I63.9  History:        Patient has prior history of Echocardiogram examinations, most                 recent 11/01/2020. CAD, Stroke; Risk Factors:Dyslipidemia and                 Non-Smoker.  Sonographer:    Dondra Prader RVT RCS Referring Phys: [phone] ERIC LINDZEN  Sonographer Comments: Technically challenging study due to limited acoustic windows. IMPRESSIONS  1. Left ventricular ejection fraction, by estimation, is 60 to 65%. The left ventricle has normal function. The left ventricle has no regional wall motion abnormalities. There is mild concentric left ventricular hypertrophy. Left ventricular diastolic parameters were normal.  2. Right ventricular systolic function is normal. The right ventricular size is normal.  3. The mitral valve is normal in structure. Trivial mitral valve regurgitation. No evidence of mitral stenosis.  4. The aortic valve is tricuspid. There is mild calcification of the aortic valve. Aortic valve regurgitation is not visualized. Aortic valve sclerosis/calcification is present, without any evidence of aortic stenosis.  5. The inferior vena cava is normal in size with greater than 50% respiratory variability, suggesting right atrial pressure of 3  mmHg.  6. Descending aorta appears dilated on limited imaging. Consider dedicated abdominal u/s to further evalaute. FINDINGS  Left Ventricle: Left ventricular ejection fraction, by estimation, is 60 to 65%. The left ventricle has normal function. The left ventricle has no regional wall motion abnormalities. The left ventricular internal cavity size was normal in size. There is  mild concentric left ventricular hypertrophy. Left ventricular diastolic parameters were normal. Right Ventricle: The right ventricular size is normal. No increase in right ventricular wall thickness. Right ventricular systolic function is normal. Left Atrium: Left atrial size was normal in size. Right Atrium: Right atrial size was normal in size.  Pericardium: There is no evidence of pericardial effusion. Mitral Valve: The mitral valve is normal in structure. Trivial mitral valve regurgitation. No evidence of mitral valve stenosis. Tricuspid Valve: The tricuspid valve is normal in structure. Tricuspid valve regurgitation is trivial. No evidence of tricuspid stenosis. Aortic Valve: The aortic valve is tricuspid. There is mild calcification of the aortic valve. Aortic valve regurgitation is not visualized. Aortic valve sclerosis/calcification is present, without any evidence of aortic stenosis. Aortic valve mean gradient measures 3.0 mmHg. Aortic valve peak gradient measures 5.4 mmHg. Aortic valve area, by VTI measures 2.03 cm. Pulmonic Valve: The pulmonic valve was normal in structure. Pulmonic valve regurgitation is not visualized. No evidence of pulmonic stenosis. Aorta: The aortic root is normal in size and structure. Venous: The inferior vena cava is normal in size with greater than 50% respiratory variability, suggesting right atrial pressure of 3 mmHg. IAS/Shunts: No atrial level shunt detected by color flow Doppler.  LEFT VENTRICLE PLAX 2D LVIDd:         4.70 cm LVIDs:         3.35 cm LV PW:         1.15 cm LV IVS:        1.05 cm LVOT diam:     2.00 cm LV SV:         48 LV SV Index:   24 LVOT Area:     3.14 cm  RIGHT VENTRICLE             IVC RV Basal diam:  3.00 cm     IVC diam: 1.60 cm RV Mid diam:    2.70 cm RV S prime:     19.80 cm/s TAPSE (M-mode): 2.2 cm LEFT ATRIUM             Index        RIGHT ATRIUM           Index LA diam:        3.80 cm 1.87 cm/m   RA Area:     10.00 cm LA Vol (A2C):   36.8 ml 18.07 ml/m  RA Volume:   20.70 ml  10.17 ml/m LA Vol (A4C):   27.6 ml 13.56 ml/m LA Biplane Vol: 32.3 ml 15.86 ml/m  AORTIC VALVE                    PULMONIC VALVE AV Area (Vmax):    2.32 cm     PV Vmax:       0.90 m/s AV Area (Vmean):   2.20 cm     PV Peak grad:  3.2 mmHg AV Area (VTI):     2.03 cm AV Vmax:           116.00 cm/s AV Vmean:           81.700 cm/s AV  VTI:            0.238 m AV Peak Grad:      5.4 mmHg AV Mean Grad:      3.0 mmHg LVOT Vmax:         85.70 cm/s LVOT Vmean:        57.200 cm/s LVOT VTI:          0.154 m LVOT/AV VTI ratio: 0.65  AORTA Ao Root diam: 3.20 cm Ao Asc diam:  4.50 cm Ao Arch diam: 2.9 cm MITRAL VALVE MV Area (PHT): 3.39 cm    SHUNTS MV Decel Time: 224 msec    Systemic VTI:  0.15 m MV E velocity: 70.10 cm/s  Systemic Diam: 2.00 cm MV A velocity: 89.10 cm/s MV E/A ratio:  0.79 Arvilla Meres MD Electronically signed by Arvilla Meres MD Signature Date/Time: 12/28/2022/2:18:31 PM    Final     Assessment/Plan: Diagnosis: 67 yo female with persistent right hemiparesis and cognitive deficits. Stroke work up is negative. Had similar admission in 2020. Based on w/u and exam today, her symptoms appear to be non-organic. Suspect conversion disorder.  Does the need for close, 24 hr/day medical supervision in concert with the patient's rehab needs make it unreasonable for this patient to be served in a less intensive setting? No Co-Morbidities requiring supervision/potential complications:  -migraines -CAD -CKD Due to bladder management, bowel management, skin/wound care, and medication administration, does the patient require 24 hr/day rehab nursing? No Does the patient require coordinated care of a physician, rehab nurse, therapy disciplines of PT, OT, SLP to address physical and functional deficits in the context of the above medical diagnosis(es)? No Addressing deficits in the following areas: balance, endurance, locomotion, strength, transferring, bowel/bladder control, dressing, grooming, cognition, and language Can the patient actively participate in an intensive therapy program of at least 3 hrs of therapy per day at least 5 days per week? Potentially The potential for patient to make measurable gains while on inpatient rehab is fair and poor Anticipated functional outcomes upon discharge from inpatient  rehab are n/a  with PT, n/a with OT, n/a with SLP. Estimated rehab length of stay to reach the above functional goals is: n/a Anticipated discharge destination: Home Overall Rehab/Functional Prognosis: excellent  POST ACUTE RECOMMENDATIONS: This patient's condition is appropriate for continued rehabilitative care in the following setting:  home health therapy or SNF Patient has agreed to participate in recommended program. N/A Note that insurance prior authorization may be required for reimbursement for recommended care.  Comment: Rehab Admissions Coordinator to follow up    MEDICAL RECOMMENDATIONS: Recommend positive feedback, positive reinforcement with therapies. Suspect that she will improve quickly from a functional standpoint.    I have personally performed a face to face diagnostic evaluation of this patient. Additionally, I have examined the patient's medical record including any pertinent labs and radiographic images. If the physician assistant has documented in this note, I have reviewed and edited or otherwise concur with the physician assistant's documentation.  Thanks,  Ranelle Oyster, MD 12/29/2022

## 2022-12-29 NOTE — Evaluation (Signed)
Speech Language Pathology Evaluation Patient Details Name: Tonya Boyd MRN: [id number] DOB: June 25, 1956 Today's Date: 12/29/2022 Time: 4098-1191 SLP Time Calculation (min) (ACUTE ONLY): 27 min  Problem List:  Patient Active Problem List   Diagnosis Date Noted   Ischemic stroke 12/26/2022   Stroke 12/26/2022   Status post total hip replacement, left 11/24/2022   Status post total knee replacement using cement, right 05/12/2022   Severe persistent asthma with status asthmaticus    Acute respiratory failure with hypoxia 04/02/2022   Asthma, chronic, unspecified asthma severity, with acute exacerbation 04/02/2022   Migraine headache 04/02/2022   GERD (gastroesophageal reflux disease) 04/02/2022   Hyperlipidemia 04/02/2022   Raynaud's disease without gangrene 10/15/2018   Angina pectoris 10/07/2018   Essential hypertension 08/03/2018   CAD (coronary artery disease), native coronary artery 08/03/2018   Post PTCA 08/02/2018   Elevated IgE level 07/19/2018   Dyspnea on exertion 07/19/2018   Long-term exposure involving bird droppings 07/19/2018   Iliotibial band syndrome of left side 10/21/2011   Past Medical History:  Past Medical History:  Diagnosis Date   Angina pectoris    Anxiety    Asthma    CAD (coronary artery disease), native coronary artery 08/02/2018   a.) [facility] 08/02/2018: EF 45%, super-dominant large RCA, 80% p-mRCA, 50% oRPDA, 80% D1; mPA 13, mPCWP 8, PA sat 81%, Ao sat 99, CO 6.95, CI 3.65 --> 4.0 x 30 mm Orsiro DES x1 to [facility]; b.) [facility] 10/07/2018: 50-60% oD1 (FFR 0.96), diffuse HG stenosis prox seg very small (<1 mm) RI --> med mgmt; c.) [facility] 04/08/2021: 80% pRCA (4.0 x 16 mm Synergy XD DES) and 80% oPDA (2.5 x 12 mm Synergy XD DES)   Chronic bronchitis    Chronic kidney disease    Complication of anesthesia    a.) postoperative nausea only (no vomiting); also "shakes"; per patient relieved w/ IV diphenhydramine   COPD (chronic obstructive pulmonary disease)     Depression    Diastolic dysfunction 07/23/2018   a.) TTE 07/23/2018: EF 55-60%, GLS -19.6%, G1DD; b.) TTE 11/01/2020: EF 55%, triv AR, G1DD   Family history of adverse reaction to anesthesia    "cousin stopped breathing; he was allergic to the anesthesia" (08/02/2018)   GERD (gastroesophageal reflux disease)    High cholesterol    History of gout    History of hiatal hernia    History of kidney stones    Hypertension 07/2018   Interstitial cystitis    Long term current use of antithrombotics/antiplatelets    a.) daily DAPT therapy (ASA + clopidogrel)   Migraines    NSVT (nonsustained ventricular tachycardia) 07/2018   a.) holter 07/13/2018: 6 beat run NSVT; maximum rate 133 bpm.   OA (osteoarthritis)    PONV (postoperative nausea and vomiting)    Raynaud's disease    Stroke-like episode 06/08/2019   a.) speech difficulty, right sided weakness/paraesthesias; unclear etiology ?? possibly conversion disorder. MRI brain x 2 negative for stroke.   Past Surgical History:  Past Surgical History:  Procedure Laterality Date   ABDOMINAL HYSTERECTOMY     ANTERIOR CERVICAL DECOMP/DISCECTOMY FUSION     APPENDECTOMY     AUGMENTATION MAMMAPLASTY Bilateral    BACK SURGERY     CATARACT EXTRACTION, BILATERAL Bilateral    CORONARY ANGIOPLASTY WITH STENT PLACEMENT  08/02/2018   CORONARY PRESSURE/FFR STUDY N/A 10/07/2018   Procedure: INTRAVASCULAR PRESSURE WIRE/FFR STUDY;  Surgeon: [name], MD;  Location: [facility];  Service: Cardiovascular;  Laterality: N/A;  CORONARY STENT INTERVENTION N/A 08/02/2018   Procedure: CORONARY STENT INTERVENTION;  Surgeon: [name], MD;  Location: [facility];  Service: Cardiovascular;  Laterality: N/A;  RCA    CORONARY STENT INTERVENTION N/A 04/08/2021   Procedure: CORONARY STENT INTERVENTION;  Surgeon: [name], MD;  Location: [facility];  Service: Cardiovascular;  Laterality: N/A;  RCA and PDA   CYSTOSCOPY W/ STONE MANIPULATION      KNEE ARTHROSCOPY Right    LAPAROSCOPIC CHOLECYSTECTOMY     LEFT HEART CATH AND CORONARY ANGIOGRAPHY N/A 10/07/2018   Procedure: LEFT HEART CATH AND CORONARY ANGIOGRAPHY;  Surgeon: [name], MD;  Location: [facility];  Service: Cardiovascular;  Laterality: N/A;   LEFT HEART CATH AND CORONARY ANGIOGRAPHY N/A 04/08/2021   Procedure: LEFT HEART CATH AND CORONARY ANGIOGRAPHY;  Surgeon: [name], MD;  Location: [facility];  Service: Cardiovascular;  Laterality: N/A;   LEFT HEART CATH AND CORONARY ANGIOGRAPHY N/A 09/04/2022   Procedure: LEFT HEART CATH AND CORONARY ANGIOGRAPHY;  Surgeon: [name], MD;  Location: [facility];  Service: Cardiovascular;  Laterality: N/A;   REPAIR ANKLE LIGAMENT Left    "tied up ligament; had tore up qthing in my ankle"   REPLACEMENT TOTAL KNEE Right    RIGHT/LEFT HEART CATH AND CORONARY ANGIOGRAPHY N/A 08/02/2018   Procedure: RIGHT/LEFT HEART CATH AND CORONARY ANGIOGRAPHY;  Surgeon: [name], MD;  Location: [facility];  Service: Cardiovascular;  Laterality: N/A;   SHOULDER SURGERY Bilateral    TOTAL HIP ARTHROPLASTY Left 11/24/2022   Procedure: TOTAL HIP ARTHROPLASTY;  Surgeon: Christena Flake, MD;  Location: [facility] OR[facility];  Service: Orthopedics;  Laterality: Left;   TOTAL KNEE ARTHROPLASTY Right 05/12/2022   Procedure: TOTAL KNEE ARTHROPLASTY;  Surgeon: Christena Flake, MD;  Location: [facility] OR[facility];  Service: Orthopedics;  Laterality: Right;   TUBAL LIGATION     HPI:  Tonya Boyd is a 67 y.o. female with hx of CAD, CKD who presented on 4/20 to [facility] with right sided weakness and speech difficulties. NIH was 12 and pt was given TNK and transferred to [facility] for further work up. CT, CTA, MRI negative for acute process or occlusion. Pt with hx similar event in 2024 with possible conversion disorder, complicated migrate and worked with OP SLP at that time, but still has some residual deficits. PMH: CVA, CAD, CKD,  asth,a. COPD, HTN, gout, Raynauds, recent L THA   Assessment / Plan / Recommendation Clinical Impression  Pt presents with mild speech and cognitive deficits.  Pt was assessed using the COGNISTAT (see below for additional information). Pt's deficits with digit span task and work recall are consistent with her baseline.  She did not exhibit an attention deficit overall and was able to attend to complex tasks.  Pt may still benefit from some memory strategies to help improve function.  She has worked with SLP in the past focussing on strategies to take her time to allow for additional processing.    Pt's speech and language presentation is atypical.  She frequently deletes articles (the/a) and copula in spontaneous speech.  She reports that she does not do this in written expression. She also deletes frequently /s/ unless it is in work initial position "sick" for "six."  She is aware of these patterns of errors.  At times she used gerunds/present progressive "-ing" but not always.  Her speech is marked by articulatory imprecision with /w/ for /r/  and /f/ for /theta/ but it is unclear if this is developmental or began in 2020 with prior event.  She reports that her tongue is asymmetric at baseline and stays to R at rest.  On OME it is difficult to tell if there is any underlying weakness causing this.  She states she feels that her jaw is crooked as well.    Pt is agreeable to working with speech therapy and believes that her past ST in 2020 was very beneficial.  SLP to follow in house to address above related deficits. Pt would benefit from ongoing ST at next level of care.    SLP Assessment  SLP Recommendation/Assessment: Patient needs continued Speech Lanaguage Pathology Services SLP Visit Diagnosis: Aphasia (R47.01);Dysarthria and anarthria (R47.1);Cognitive communication deficit (R41.841)    Recommendations for follow up therapy are one component of a multi-disciplinary discharge planning process,  led by the attending physician.  Recommendations may be updated based on patient status, additional functional criteria and insurance authorization.    Follow Up Recommendations   (ST at next level of care)    Assistance Recommended at Discharge  None  Functional Status Assessment Patient has had a recent decline in their functional status and demonstrates the ability to make significant improvements in function in a reasonable and predictable amount of time.  Frequency and Duration min 2x/week  2 weeks      SLP Evaluation Cognition  Overall Cognitive Status: Impaired/Different from baseline Orientation Level: Oriented X4 Year: 2024 Month: April Day of Week: Incorrect Attention: Focused;Sustained Focused Attention: Appears intact Sustained Attention: Appears intact (but with mild impairment on digit span task) Memory: Impaired Memory Impairment: Decreased short term memory Awareness: Appears intact Executive Function: Reasoning Reasoning: Appears intact       Comprehension  Auditory Comprehension Overall Auditory Comprehension: Appears within functional limits for tasks assessed Commands: Within Functional Limits Conversation: Complex Reading Comprehension Reading Status: Not tested    Expression Expression Primary Mode of Expression: Verbal Verbal Expression Overall Verbal Expression: Impaired at baseline Repetition: Impaired Naming: No impairment Pragmatics: No impairment Written Expression Dominant Hand: Left Written Expression: Not tested (Pt reports with no deficits in written communication)   Oral / Motor  Oral Motor/Sensory Function Overall Oral Motor/Sensory Function: Mild impairment Facial ROM: Within Functional Limits Facial Symmetry: Within Functional Limits Lingual ROM: Within Functional Limits Lingual Symmetry: Abnormal symmetry right            Kerrie Pleasure, MA, CCC-SLP Acute Rehabilitation Services Office: [phone] 12/29/2022, 1:32 PM

## 2022-12-29 NOTE — Progress Notes (Signed)
Inpatient Rehab Admissions Coordinator:   Met with patient at bedside to review consult from Dr. Riley Kill.  Explained that pt did not meet requirements for a CIR admit due to lack of necessity for physician oversight.  She does not feel that she can d/c home, which I would agree with based on yesterday's therapy notes.  I let her know that I would have TOC f/u and we will sign off.    Estill Dooms, PT, DPT Admissions Coordinator [phone] 12/29/22  3:19 PM

## 2022-12-29 NOTE — TOC Initial Note (Signed)
Transition of Care [facility]) - Initial/Assessment Note    Patient Details  Name: Tonya Boyd MRN: [id number] Date of Birth: [birth year]  Transition of [facility] CM/SW Contact:    Kermit Balo, RN Phone Number: 12/29/2022, 3:47 PM  Clinical Narrative:                 Pt is from home with her spouse. CIR has denied her for admission. CM met with the patient and her spouse and they asked to have [facility] look at her and for her to be faxed out for SNF rehab in [place] area. CM will provide bed offers in the am to them and see if  [facility] will offer.  Pt will need insurance approval once a rehab bed is selected.  TOC following.  Expected Discharge Plan: Skilled Nursing Facility Barriers to Discharge: Continued Medical Work up   Patient Goals and CMS Choice   CMS Medicare.gov Compare Post Acute Care list provided to:: Patient Choice offered to / list presented to : Patient, Spouse      Expected Discharge Plan and Services In-house Referral: Clinical Social Work Discharge Planning Services: CM Consult Post Acute Care Choice: Skilled Nursing Facility Living arrangements for the past 2 months: Single Family Home                                      Prior Living Arrangements/Services Living arrangements for the past 2 months: Single Family Home Lives with:: Spouse Patient language and need for interpreter reviewed:: Yes Do you feel safe going back to the place where you live?: Yes      Need for Family Participation in Patient Care: Yes (Comment) Care giver support system in place?: Yes (comment)   Criminal Activity/Legal Involvement Pertinent to Current Situation/Hospitalization: No - Comment as needed  Activities of Daily Living Home Assistive Devices/Equipment: Cane (specify quad or straight), Walker (specify type) (similar to quad cane; front wheel walker) ADL Screening (condition at time of admission) Patient's cognitive ability adequate to safely complete  daily activities?: Yes Is the patient deaf or have difficulty hearing?: No Does the patient have difficulty seeing, even when wearing glasses/contacts?: No Does the patient have difficulty concentrating, remembering, or making decisions?: No Patient able to express need for assistance with ADLs?: Yes Does the patient have difficulty dressing or bathing?: No Independently performs ADLs?: Yes (appropriate for developmental age) Does the patient have difficulty walking or climbing stairs?: Yes Weakness of Legs: Left Weakness of Arms/Hands: None  Permission Sought/Granted                  Emotional Assessment Appearance:: Appears stated age Attitude/Demeanor/Rapport: Engaged Affect (typically observed): Accepting Orientation: : Oriented to Self, Oriented to Place, Oriented to  Time, Oriented to Situation   Psych Involvement: No (comment)  Admission diagnosis:  Ischemic stroke [I63.9] Stroke [I63.9] Patient Active Problem List   Diagnosis Date Noted   Ischemic stroke 12/26/2022   Stroke 12/26/2022   Status post total hip replacement, left 11/24/2022   Status post total knee replacement using cement, right 05/12/2022   Severe persistent asthma with status asthmaticus    Acute respiratory failure with hypoxia 04/02/2022   Asthma, chronic, unspecified asthma severity, with acute exacerbation 04/02/2022   Migraine headache 04/02/2022   GERD (gastroesophageal reflux disease) 04/02/2022   Hyperlipidemia 04/02/2022   Raynaud's disease without gangrene 10/15/2018   Angina pectoris  10/07/2018   Essential hypertension 08/03/2018   CAD (coronary artery disease), native coronary artery 08/03/2018   Post PTCA 08/02/2018   Elevated IgE level 07/19/2018   Dyspnea on exertion 07/19/2018   Long-term exposure involving bird droppings 07/19/2018   Iliotibial band syndrome of left side 10/21/2011   PCP:  Georgianne Fick, MD Pharmacy:   [facility] [place], [place] - [address] [place] [address] Phone: [phone] Fax: [phone]  [facility] - [place], [place] - [address] [place] [address] Phone: [phone] Fax: [phone]     Social Determinants of Health (SDOH) Social History: SDOH Screenings   Food Insecurity: No Food Insecurity (12/28/2022)  Housing: Low Risk  (12/28/2022)  Transportation Needs: No Transportation Needs (12/28/2022)  Utilities: Not At Risk (12/28/2022)  Tobacco Use: Low Risk  (12/26/2022)   SDOH Interventions:     Readmission Risk Interventions     No data to display

## 2022-12-29 NOTE — Plan of Care (Signed)

## 2022-12-29 NOTE — Progress Notes (Signed)
Physical Therapy Treatment Patient Details Name: Tonya Boyd MRN: [id number] DOB: October 05, 1955 Today's Date: 12/29/2022   History of Present Illness Pt is 67 yo female who presented to [facility] on 12/26/22 with R sided weakness and speech abnormalities, received TNK at 1245. MRI neg for acute infarct.  PMH: CVA, CAD, CKD, asth,a. COPD, HTN, gout, Raynauds, recent L THA    PT Comments    Pt received in bed, sister present (left for session). Pt reports she does not feel that her speech or R sided strength is improving yet. Performed supine LE there ex RLE prior to sitting up. Pt able to come to EOB without physical assist. Pt with fair control of RLE in NWB position but once WB'ing pt continues to have buckling or R knee and poor control of RLE in general including difficulty with foot placement on floor. Pt also has occasional L knee buckling in standing. Pt needed mod A +2 and RW to ambulate 7'. Very fatigued after ambulation. Patient will benefit from intensive inpatient follow up therapy, >3 hours/day. PT will continue to follow.     Recommendations for follow up therapy are one component of a multi-disciplinary discharge planning process, led by the attending physician.  Recommendations may be updated based on patient status, additional functional criteria and insurance authorization.  Follow Up Recommendations       Assistance Recommended at Discharge Frequent or constant Supervision/Assistance  Patient can return home with the following Two people to help with walking and/or transfers;A lot of help with bathing/dressing/bathroom;Assistance with cooking/housework;Assist for transportation;Help with stairs or ramp for entrance   Equipment Recommendations  None recommended by PT (defer to post acute)    Recommendations for Other Services Rehab consult     Precautions / Restrictions Precautions Precautions: Fall Precaution Comments: L posterior hip precautons, watch BP, R knee  buckle Restrictions Weight Bearing Restrictions: No     Mobility  Bed Mobility Overal bed mobility: Needs Assistance Bed Mobility: Supine to Sit     Supine to sit: Supervision     General bed mobility comments: pt able to sit straight up in bed and slide LE's off EOB to come to sitting, no physical assist needed    Transfers Overall transfer level: Needs assistance Equipment used: Rolling walker (2 wheels) Transfers: Sit to/from Stand, Bed to chair/wheelchair/BSC Sit to Stand: Mod assist, +2 physical assistance   Step pivot transfers: +2 physical assistance, Mod assist       General transfer comment: pt with R foot slap on floor, continues to report that she cannot feel the floor with R foot. R knee buckling and increased hip sway.    Ambulation/Gait Ambulation/Gait assistance: Mod assist, +2 physical assistance, +2 safety/equipment (+ chair follow) Gait Distance (Feet): 7 Feet Assistive device: Rolling walker (2 wheels) Gait Pattern/deviations: Step-through pattern, Decreased stride length, Decreased stance time - right, Decreased dorsiflexion - right, Decreased weight shift to right, Knee flexed in stance - right, Knee flexed in stance - left, Knees buckling Gait velocity: decreased Gait velocity interpretation: <1.31 ft/sec, indicative of household ambulator Pre-gait activities: wt shifting and stepping B feet in place. Pt with R knee buckling with R wt shift. Occasional L knee buckle when picking up RLE. General Gait Details: pt with short steps, difficulty advancing RLE and poor stability in stance. multiple episodes of R knee buckling and L knee buckling during gait. Did not c/o worsening HA today. Fatigued very quickly.   Stairs  Wheelchair Mobility    Modified Rankin (Stroke Patients Only) Modified Rankin (Stroke Patients Only) Pre-Morbid Rankin Score: Slight disability Modified Rankin: Moderately severe disability     Balance Overall  balance assessment: Needs assistance Sitting-balance support: Feet supported, Single extremity supported Sitting balance-Leahy Scale: Fair Sitting balance - Comments: pt loses balance with reaching in sitting   Standing balance support: Reliant on assistive device for balance, Bilateral upper extremity supported Standing balance-Leahy Scale: Poor Standing balance comment: dependent on BUE support and external assist                            Cognition Arousal/Alertness: Awake/alert Behavior During Therapy: WFL for tasks assessed/performed, Impulsive Overall Cognitive Status: Impaired/Different from baseline Area of Impairment: Following commands, Memory                     Memory: Decreased short-term memory Following Commands: Follows one step commands consistently, Follows one step commands with increased time       General Comments: pt follows basic commands and has awareness of deficits        Exercises Total Joint Exercises Bridges: AROM, 5 reps, Supine General Exercises - Lower Extremity Ankle Circles/Pumps: AROM, Both, 10 reps, Seated Quad Sets: AROM, Both, 10 reps, Seated Heel Slides: AROM, Right, 10 reps, Supine Straight Leg Raises: AROM, Left, 5 reps, Supine    General Comments General comments (skin integrity, edema, etc.): VSS. No family present today. Pt seated in chair after session      Pertinent Vitals/Pain Pain Assessment Pain Assessment: Faces Faces Pain Scale: Hurts little more Pain Location: L hip with bridging exercises Pain Descriptors / Indicators: Sore Pain Intervention(s): Limited activity within patient's tolerance, Monitored during session    Home Living     Available Help at Discharge: Family Type of Home: House                  Prior Function            PT Goals (current goals can now be found in the care plan section) Acute Rehab PT Goals Patient Stated Goal: return home PT Goal Formulation: With  patient/family Time For Goal Achievement: 01/10/23 Potential to Achieve Goals: Good Progress towards PT goals: Progressing toward goals    Frequency    Min 4X/week      PT Plan Current plan remains appropriate    Co-evaluation              AM-PAC PT "6 Clicks" Mobility   Outcome Measure  Help needed turning from your back to your side while in a flat bed without using bedrails?: A Little Help needed moving from lying on your back to sitting on the side of a flat bed without using bedrails?: A Little Help needed moving to and from a bed to a chair (including a wheelchair)?: A Lot Help needed standing up from a chair using your arms (e.g., wheelchair or bedside chair)?: Total Help needed to walk in hospital room?: Total Help needed climbing 3-5 steps with a railing? : Total 6 Click Score: 11    End of Session Equipment Utilized During Treatment: Gait belt Activity Tolerance: Patient tolerated treatment well Patient left: in chair;with call bell/phone within reach;with chair alarm set Nurse Communication: Mobility status PT Visit Diagnosis: Unsteadiness on feet (R26.81);Hemiplegia and hemiparesis;Difficulty in walking, not elsewhere classified (R26.2);Apraxia (R48.2);Pain Hemiplegia - Right/Left: Right Hemiplegia - dominant/non-dominant: Dominant Hemiplegia - caused by:  Cerebral infarction Pain - Right/Left: Left Pain - part of body: Hip (headache)     Time: 1610-9604 PT Time Calculation (min) (ACUTE ONLY): 18 min  Charges:  $Gait Training: 8-22 mins                     Lyanne Co, PT  Acute Rehab Services Secure chat preferred Office [phone]    Lawana Chambers Karlisha Mathena 12/29/2022, 4:19 PM

## 2022-12-29 NOTE — Care Management Important Message (Signed)
Important Message  Patient Details  Name: Tonya Boyd MRN: [id number] Date of Birth: 02-Feb-1956   Medicare Important Message Given:  Yes     Nereyda Bowler Stefan Church 12/29/2022, 12:07 PM

## 2022-12-30 DIAGNOSIS — I509 Heart failure, unspecified: Secondary | ICD-10-CM

## 2022-12-30 DIAGNOSIS — G4700 Insomnia, unspecified: Secondary | ICD-10-CM | POA: Diagnosis present

## 2022-12-30 DIAGNOSIS — J449 Chronic obstructive pulmonary disease, unspecified: Secondary | ICD-10-CM | POA: Diagnosis present

## 2022-12-30 DIAGNOSIS — N189 Chronic kidney disease, unspecified: Secondary | ICD-10-CM | POA: Diagnosis present

## 2022-12-30 MED ORDER — ONDANSETRON HCL 4 MG/2ML IJ SOLN: 4.0000 mg | Freq: Once | INTRAMUSCULAR | Status: DC

## 2022-12-30 MED ORDER — MOMETASONE FURO-FORMOTEROL FUM 100-5 MCG/ACT IN AERO
2.0000 | INHALATION_SPRAY | Freq: Two times a day (BID) | RESPIRATORY_TRACT | Status: DC
  Administered 2022-12-31: 2 via RESPIRATORY_TRACT
  Filled 2022-12-30: qty 8.8

## 2022-12-30 MED ORDER — ONDANSETRON HCL 4 MG/2ML IJ SOLN: 4.0000 mg | Freq: Four times a day (QID) | INTRAMUSCULAR | Status: DC | PRN

## 2022-12-30 MED ORDER — POLYETHYLENE GLYCOL 3350 17 G PO PACK
17.0000 g | PACK | Freq: Every day | ORAL | Status: DC | PRN
  Administered 2022-12-30 – 2022-12-31 (×2): 17 g via ORAL
  Filled 2022-12-30 (×2): qty 1

## 2022-12-30 NOTE — Progress Notes (Signed)
Physical Therapy Treatment Patient Details Name: Tonya Boyd MRN: [id number] DOB: 1956/01/21 Today's Date: 12/30/2022   History of Present Illness Pt is 67 yo female who presented to [facility] on 12/26/22 with R sided weakness and speech abnormalities, received TNK at 1245. MRI neg for acute infarct.  PMH: CVA, CAD, CKD, asth,a. COPD, HTN, gout, Raynauds, recent L THA    PT Comments    Pt continues to demonstrate R lower extremity weakness with functional mobility, needing minA to lift it onto the bed with return to supine. However, her bil knees would buckle in standing, needing modA for transfers and to take steps along EOB with a RW for support. Pt deferred advancing her gait distance further due to nausea today. Thus, focused remainder of session on bil lower extremity strengthening/AROM. Pt performed LAQ with more ease on her R than L when sitting on the commode. Will continue to follow acutely.     Recommendations for follow up therapy are one component of a multi-disciplinary discharge planning process, led by the attending physician.  Recommendations may be updated based on patient status, additional functional criteria and insurance authorization.  Follow Up Recommendations       Assistance Recommended at Discharge Frequent or constant Supervision/Assistance  Patient can return home with the following Two people to help with walking and/or transfers;A lot of help with bathing/dressing/bathroom;Assistance with cooking/housework;Assist for transportation;Help with stairs or ramp for entrance;Direct supervision/assist for medications management;Direct supervision/assist for financial management   Equipment Recommendations  None recommended by PT (defer to post acute)    Recommendations for Other Services       Precautions / Restrictions Precautions Precautions: Fall Precaution Comments: L posterior hip precautions, watch BP, R knee buckle Restrictions Weight Bearing Restrictions:  No     Mobility  Bed Mobility Overal bed mobility: Needs Assistance Bed Mobility: Sit to Supine       Sit to supine: Min assist, HOB elevated   General bed mobility comments: MinA to lift R leg onto bed with return to supine.    Transfers Overall transfer level: Needs assistance Equipment used: Rolling walker (2 wheels), 1 person hand held assist Transfers: Sit to/from Stand, Bed to chair/wheelchair/BSC Sit to Stand: Mod assist   Step pivot transfers: Mod assist       General transfer comment: x1 sit to stand rep to HHA with anterior approach to support pt while RN assisted with pericare, modA and bil knee block provided. x1 sit to stand rep from commode to RW with cues for hand placement, modA for balance and to provide tactile cues at bil knees to reduce buckling. ModA for tactile cues at either quad during stance phase while performing steps towards EOB with RW.    Ambulation/Gait Ambulation/Gait assistance: Mod assist Gait Distance (Feet): 5 Feet Assistive device: Rolling walker (2 wheels) Gait Pattern/deviations: Step-through pattern, Decreased stride length, Decreased stance time - right, Decreased dorsiflexion - right, Decreased weight shift to right, Knee flexed in stance - right, Knee flexed in stance - left, Knees buckling Gait velocity: decreased Gait velocity interpretation: <1.31 ft/sec, indicative of household ambulator   General Gait Details: Pt with bil knee buckling this date, needing tactile cues at either quads during stance phase to prevent buckling to safely progress steps along EOB. Pt deferring further mobility due to nausea this date.   Stairs             Wheelchair Mobility    Modified Rankin (Stroke Patients Only) Modified Rankin (Stroke  Patients Only) Pre-Morbid Rankin Score: Slight disability Modified Rankin: Moderately severe disability     Balance Overall balance assessment: Needs assistance Sitting-balance support: Feet  supported, No upper extremity supported Sitting balance-Leahy Scale: Fair     Standing balance support: Reliant on assistive device for balance, Bilateral upper extremity supported Standing balance-Leahy Scale: Poor Standing balance comment: dependent on BUE support and external assist                            Cognition Arousal/Alertness: Awake/alert Behavior During Therapy: WFL for tasks assessed/performed, Impulsive Overall Cognitive Status: Impaired/Different from baseline Area of Impairment: Following commands, Memory                     Memory: Decreased short-term memory Following Commands: Follows one step commands consistently, Follows one step commands with increased time       General Comments: pt follows basic commands and has awareness of deficits        Exercises Total Joint Exercises Bridges: AROM, Supine, 10 reps, Strengthening, Both General Exercises - Lower Extremity Ankle Circles/Pumps: AROM, Both, 10 reps, Supine Quad Sets: AROM, Both, 10 reps, Supine Long Arc Quad: AROM, Strengthening, Both, Seated, 5 reps (x5 sec isometric hold at top) Heel Slides: AROM, 10 reps, Supine, Both Straight Leg Raises: AROM, 5 reps, Supine, Both, Strengthening (stopped due to complaint of L groin pain)    General Comments        Pertinent Vitals/Pain Pain Assessment Pain Assessment: Faces Faces Pain Scale: Hurts little more Pain Location: headache, L groin with SLR (cued to cease), nausea Pain Descriptors / Indicators: Sore, Grimacing, Other (Comment) (nausea) Pain Intervention(s): Limited activity within patient's tolerance, Monitored during session, Repositioned, Other (comment) (pt requesting meds from RN)    Home Living                          Prior Function            PT Goals (current goals can now be found in the care plan section) Acute Rehab PT Goals Patient Stated Goal: to improve PT Goal Formulation: With  patient/family Time For Goal Achievement: 01/10/23 Potential to Achieve Goals: Good Progress towards PT goals: Progressing toward goals    Frequency    Min 4X/week      PT Plan Current plan remains appropriate    Co-evaluation              AM-PAC PT "6 Clicks" Mobility   Outcome Measure  Help needed turning from your back to your side while in a flat bed without using bedrails?: A Little Help needed moving from lying on your back to sitting on the side of a flat bed without using bedrails?: A Little Help needed moving to and from a bed to a chair (including a wheelchair)?: A Lot Help needed standing up from a chair using your arms (e.g., wheelchair or bedside chair)?: A Lot Help needed to walk in hospital room?: Total Help needed climbing 3-5 steps with a railing? : Total 6 Click Score: 12    End of Session Equipment Utilized During Treatment: Gait belt Activity Tolerance: Patient tolerated treatment well Patient left: with call bell/phone within reach;in bed;with bed alarm set;with family/visitor present Nurse Communication: Mobility status;Other (comment) (pt requesting meds) PT Visit Diagnosis: Unsteadiness on feet (R26.81);Hemiplegia and hemiparesis;Difficulty in walking, not elsewhere classified (R26.2);Apraxia (R48.2);Pain;Other abnormalities of gait and  mobility (R26.89);Muscle weakness (generalized) (M62.81) Hemiplegia - Right/Left: Right Hemiplegia - dominant/non-dominant: Dominant Hemiplegia - caused by: Cerebral infarction Pain - Right/Left: Left Pain - part of body: Hip (headache)     Time: 1610-9604 PT Time Calculation (min) (ACUTE ONLY): 22 min  Charges:  $Therapeutic Exercise: 8-22 mins                     Raymond Gurney, PT, DPT Acute Rehabilitation Services  Office: [phone]    Jewel Baize 12/30/2022, 2:01 PM

## 2022-12-30 NOTE — Plan of Care (Signed)
  Problem: Coping: Goal: Will verbalize positive feelings about self Outcome: Progressing Goal: Will identify appropriate support needs Outcome: Progressing   Problem: Self-Care: Goal: Ability to participate in self-care as condition permits will improve Outcome: Progressing   Problem: Nutrition: Goal: Dietary intake will improve Outcome: Progressing

## 2022-12-30 NOTE — TOC Progression Note (Addendum)
Transition of Care [facility]) - Progression Note    Patient Details  Name: Tonya Boyd MRN: [id number] Date of Birth: Oct 29, 1955  Transition of [facility]) CM/SW Contact  Kermit Balo, RN Phone Number: 12/30/2022, 10:26 AM  Clinical Narrative:    Patient and her spouse were provided bed offers from the facilities in [facility]. They decided on [facility]. CM has updated Revonda Standard at the [facility] and asked the MOA to begin insurance auth.  TOC following.  1330: pt has received insurance auth for SNF at [facility]. Auth 4/24-4/26. [facility] will have a bed available for her tomorrow.  Expected Discharge Plan: Skilled Nursing Facility Barriers to Discharge: Continued Medical Work up  Expected Discharge Plan and Services In-house Referral: Clinical Social Work Discharge Planning Services: CM Consult Post Acute Care Choice: Skilled Nursing Facility Living arrangements for the past 2 months: Single Family Home                                       Social Determinants of Health (SDOH) Interventions SDOH Screenings   Food Insecurity: No Food Insecurity (12/28/2022)  Housing: Low Risk  (12/28/2022)  Transportation Needs: No Transportation Needs (12/28/2022)  Utilities: Not At Risk (12/28/2022)  Tobacco Use: Low Risk  (12/26/2022)    Readmission Risk Interventions     No data to display

## 2022-12-30 NOTE — Progress Notes (Signed)
STROKE TEAM PROGRESS NOTE   INTERVAL HISTORY Husband is at bedside.  Patient is lying comfortably in bed awake and alert in no apparent distress.  No acute events overnight, speech has improved. Pending insurance auth/CIR bed for discharge.   Vitals:   12/29/22 2357 12/30/22 0356 12/30/22 0717 12/30/22 1116  BP: 129/69 118/72 (!) 137/57 139/63  Pulse: 84 94 91 86  Resp: Temp: (!) 97.3 F (36.3 C) (!) 97.3 F (36.3 C) 98.1 F (36.7 C) 97.6 F (36.4 C)  TempSrc: Oral Oral Oral Oral  SpO2: 100% 97% 98% 97%  Weight:      Height:       CBC:  Recent Labs  Lab 12/26/22 1212 12/26/22 1222  WBC 6.9  --   NEUTROABS 4.0  --   HGB 12.4 13.3  HCT 38.0 39.0  MCV 98.4  --   PLT 333  --    Basic Metabolic Panel:  Recent Labs  Lab 12/26/22 1212 12/26/22 1222  NA 137 139  K 4.5 4.6  CL 101 100  CO2 27  --   GLUCOSE 107* 105*  BUN 22 26*  CREATININE 1.08* 1.10*  CALCIUM 8.9  --    Lipid Panel:  Recent Labs  Lab 12/27/22 0114  CHOL 125  TRIG 131  HDL 49  CHOLHDL 2.6  VLDL 26  LDLCALC 50   HgbA1c:  Recent Labs  Lab 12/26/22 1601  HGBA1C 5.2   Urine Drug Screen:  Recent Labs  Lab 12/26/22 1258  LABOPIA NONE DETECTED  COCAINSCRNUR NONE DETECTED  LABBENZ NONE DETECTED  AMPHETMU NONE DETECTED  THCU NONE DETECTED  LABBARB NONE DETECTED    Alcohol Level  Recent Labs  Lab 12/26/22 1212  ETH <10    IMAGING past 24 hours No results found.  PHYSICAL EXAM  Temp:  [97.3 F (36.3 C)-98.1 F (36.7 C)] 97.6 F (36.4 C) (04/24 1116) Pulse Rate:  [84-94] 86 (04/24 1116) Resp:  [14-18] 16 (04/24 1116) BP: (118-143)/(57-74) 139/63 (04/24 1116) SpO2:  [96 %-100 %] 97 % (04/24 1116)  General - Well nourished, well developed, in no apparent distress. Cardiovascular - Regular rhythm and rate.  Mental Status -  Level of arousal and orientation to time, place, and person were intact.  Childlike speech, no aphasia or dysarthria which appears variable  with distraction--improved from yesterday. Language including expression, naming, repetition, comprehension was assessed and found intact. Attention span and concentration were normal. Recent and remote memory were intact. Fund of Knowledge was assessed and was intact.  Cranial Nerves II - XII - II - Visual field intact OU. III, IV, VI - Extraocular movements intact. V - Facial sensation intact bilaterally. VII - Facial movement intact bilaterally. VIII - Hearing & vestibular intact bilaterally. X - Palate elevates symmetrically. XI - Chin turning & shoulder shrug intact bilaterally. XII - Tongue protrusion intact.  Motor Strength -bilateral uppers equal strength with no drift, left leg 3 out of 5 (has had hip surgery a month ago), right leg 4/5. Motor Tone - Muscle tone was assessed at the neck and appendages and was normal. Sensory - subjective decreased to right side noted Coordination - The patient had normal movements in the hands and feet with no ataxia or dysmetria.  Tremor was absent.  Gait and Station - deferred.  ASSESSMENT/PLAN [name] is a 67 y.o. female with history of CAD, CKD, COPD, depression and anxiety, hypertension, hyperlipidemia, migraines, strokelike episodes, GERD, CHF,  Raynaud's disease, who presented to [facility] for acute onset of right-sided weakness and speech difficulties.  NIH was 12 at  [facility] and she was given TNK and transferred to [facility] for further workup  Strokelike symptoms ?due to complicated migraine status post TNK  Code Stroke  CT head No acute abnormality. ASPECTS 10.    CTA head & neck no LVO MRI no acute process 2D Echo pending LDL 50 HgbA1c 5.2 VTE prophylaxis -SCDs    Diet   Diet Heart Room service appropriate? Yes with Assist; Fluid consistency: Thin   325 twice daily prior to admission, now on aspirin 81.  Therapy recommendations: CIR Disposition: Pending  Hypertension Home meds: Imdur 30,  losartan 25 mg Stable Long-term BP goal normotensive  Hyperlipidemia Home meds: Repatha, not resumed in hospital LDL 50, goal < 70 Continue statin at discharge  Migraines Continue Depakote, increased to home dose of 500 mg daily  Other Stroke Risk Factors Advanced Age >/= 68  Obesity, Body mass index is 37.71 kg/m., BMI >/= 30 associated with increased stroke risk, recommend weight loss, diet and exercise as appropriate  Coronary artery disease Migraines Congestive heart failure  Other Active Problems COPD GERD Insomnia  Hospital day # 4     Patient states her speech is improving.  She still has subjective decrease sensation on the right.  Therapy recommends inpatient rehab.  Medically stable to be transferred to rehab when bed available.  Discussed with patient and husband and answered questions.  Tonya Heady, MD Medical Director [facility] Pager: [phone] 12/30/2022 12:34 PM   To contact Stroke Continuity provider, please refer to [email or web address]. After hours, contact General Neurology

## 2022-12-30 NOTE — Progress Notes (Signed)
Occupational Therapy Treatment Patient Details Name: Tonya Boyd MRN: [id number] DOB: June 06, 1956 Today's Date: 12/30/2022   History of present illness Pt is 67 yo female who presented to [facility] on 12/26/22 with R sided weakness and speech abnormalities, received TNK at 1245. MRI neg for acute infarct.  PMH: CVA, CAD, CKD, asth,a. COPD, HTN, gout, Raynauds, recent L THA   OT comments  Patient received in supine and agreeable to OT session. Patient was supervision to get to EOB and required max assist to donn pull up brief up to knees. Patient asking to use [facility] with mod assist to stand and for balance with transfer. Patient able to perform toilet hygiene with assistance for balance while standing. Patient performed transfer back to EOB and then to recliner. Patient making good progress and would benefit from continued OT services to address ADLs and ADL transfers. Acute OT to continue to follow.    Recommendations for follow up therapy are one component of a multi-disciplinary discharge planning process, led by the attending physician.  Recommendations may be updated based on patient status, additional functional criteria and insurance authorization.    Assistance Recommended at Discharge Frequent or constant Supervision/Assistance  Patient can return home with the following  A lot of help with walking and/or transfers;A lot of help with bathing/dressing/bathroom;Assistance with cooking/housework;Help with stairs or ramp for entrance;Assist for transportation   Equipment Recommendations  None recommended by OT    Recommendations for Other Services      Precautions / Restrictions Precautions Precautions: Fall Precaution Comments: L posterior hip precautons, watch BP, R knee buckle Restrictions Weight Bearing Restrictions: No       Mobility Bed Mobility Overal bed mobility: Needs Assistance Bed Mobility: Supine to Sit     Supine to sit: Supervision     General bed mobility  comments: verbal cue to initiate and supervision    Transfers Overall transfer level: Needs assistance Equipment used: Rolling walker (2 wheels) Transfers: Sit to/from Stand, Bed to chair/wheelchair/BSC Sit to Stand: Mod assist     Step pivot transfers: Mod assist     General transfer comment: mod assist for balance and assistance with walker management. episodes of right knee buckling but patient able to correct with UE use and RW     Balance Overall balance assessment: Needs assistance Sitting-balance support: Feet supported, Single extremity supported Sitting balance-Leahy Scale: Fair Sitting balance - Comments: maintain balance on EOB while donning pull up brief with supervision   Standing balance support: Reliant on assistive device for balance, Bilateral upper extremity supported Standing balance-Leahy Scale: Poor Standing balance comment: reliant on UE support for standing balance                           ADL either performed or assessed with clinical judgement   ADL Overall ADL's : Needs assistance/impaired     Grooming: Wash/dry hands;Wash/dry face;Oral care;Set up;Sitting;Supervision/safety Grooming Details (indicate cue type and reason): in recliner             Lower Body Dressing: Maximal assistance;Sit to/from stand;Sitting/lateral leans Lower Body Dressing Details (indicate cue type and reason): required max assist to donn pull up brief Toilet Transfer: Moderate assistance;BSC/3in1;Rolling walker (2 wheels) Toilet Transfer Details (indicate cue type and reason): mod assist for balance due to Right knee instability Toileting- Clothing Manipulation and Hygiene: Moderate assistance;Sit to/from stand Toileting - Clothing Manipulation Details (indicate cue type and reason): assistance with balance with patient performing hygiene  General ADL Comments: limited by right knee buckling    Extremity/Trunk Assessment              Vision        Perception     Praxis      Cognition Arousal/Alertness: Awake/alert Behavior During Therapy: WFL for tasks assessed/performed, Impulsive Overall Cognitive Status: Impaired/Different from baseline Area of Impairment: Following commands, Memory                     Memory: Decreased short-term memory Following Commands: Follows one step commands consistently, Follows one step commands with increased time       General Comments: aware of right knee buckling and following directions        Exercises      Shoulder Instructions       General Comments Husband present during visit    Pertinent Vitals/ Pain       Pain Assessment Pain Assessment: Faces Faces Pain Scale: Hurts little more Pain Location: headache Pain Descriptors / Indicators: Headache Pain Intervention(s): Monitored during session  Home Living                                          Prior Functioning/Environment              Frequency  Min 2X/week        Progress Toward Goals  OT Goals(current goals can now be found in the care plan section)  Progress towards OT goals: Progressing toward goals  Acute Rehab OT Goals Patient Stated Goal: get stronger OT Goal Formulation: With patient/family Time For Goal Achievement: 01/08/23 Potential to Achieve Goals: Good ADL Goals Pt Will Perform Grooming: with supervision;standing Pt Will Perform Upper Body Bathing: with set-up;sitting Pt Will Perform Lower Body Bathing: with min assist;with adaptive equipment;sit to/from stand Pt Will Perform Upper Body Dressing: with set-up;sitting Pt Will Perform Lower Body Dressing: with min assist;sit to/from stand;with adaptive equipment Pt Will Transfer to Toilet: with min assist;stand pivot transfer;bedside commode  Plan Discharge plan remains appropriate    Co-evaluation                 AM-PAC OT "6 Clicks" Daily Activity     Outcome Measure   Help from another  person eating meals?: A Little Help from another person taking care of personal grooming?: A Little Help from another person toileting, which includes using toliet, bedpan, or urinal?: A Lot Help from another person bathing (including washing, rinsing, drying)?: A Lot Help from another person to put on and taking off regular upper body clothing?: A Lot Help from another person to put on and taking off regular lower body clothing?: A Lot 6 Click Score: 14    End of Session Equipment Utilized During Treatment: Gait belt;Rolling walker (2 wheels)  OT Visit Diagnosis: Unsteadiness on feet (R26.81);Muscle weakness (generalized) (M62.81);Pain;Hemiplegia and hemiparesis Hemiplegia - Right/Left: Right Hemiplegia - dominant/non-dominant: Non-Dominant Hemiplegia - caused by: Other cerebrovascular disease Pain - part of body:  (Headache)   Activity Tolerance Patient tolerated treatment well;Other (comment) (headache improved by end of session)   Patient Left in chair;with call bell/phone within reach;with chair alarm set;with family/visitor present   Nurse Communication Mobility status;Patient requests pain meds        Time: 0102-7253 OT Time Calculation (min): 29 min  Charges: OT General Charges $OT Visit: 1 Visit OT  Treatments $Self Care/Home Management : 23-37 mins  Alfonse Flavors, OTA Acute Rehabilitation Services  Office [phone]   Dewain Penning 12/30/2022, 10:21 AM

## 2022-12-31 DIAGNOSIS — R2990 Unspecified symptoms and signs involving the nervous system: Secondary | ICD-10-CM | POA: Diagnosis not present

## 2022-12-31 DIAGNOSIS — M6281 Muscle weakness (generalized): Secondary | ICD-10-CM | POA: Diagnosis not present

## 2022-12-31 DIAGNOSIS — I2510 Atherosclerotic heart disease of native coronary artery without angina pectoris: Secondary | ICD-10-CM | POA: Diagnosis not present

## 2022-12-31 DIAGNOSIS — Z7401 Bed confinement status: Secondary | ICD-10-CM | POA: Diagnosis not present

## 2022-12-31 DIAGNOSIS — Z743 Need for continuous supervision: Secondary | ICD-10-CM | POA: Diagnosis not present

## 2022-12-31 DIAGNOSIS — J449 Chronic obstructive pulmonary disease, unspecified: Secondary | ICD-10-CM | POA: Diagnosis not present

## 2022-12-31 DIAGNOSIS — R531 Weakness: Secondary | ICD-10-CM | POA: Diagnosis not present

## 2022-12-31 DIAGNOSIS — I509 Heart failure, unspecified: Secondary | ICD-10-CM | POA: Diagnosis not present

## 2022-12-31 DIAGNOSIS — I639 Cerebral infarction, unspecified: Secondary | ICD-10-CM | POA: Diagnosis not present

## 2022-12-31 DIAGNOSIS — K219 Gastro-esophageal reflux disease without esophagitis: Secondary | ICD-10-CM | POA: Diagnosis not present

## 2022-12-31 DIAGNOSIS — I69351 Hemiplegia and hemiparesis following cerebral infarction affecting right dominant side: Secondary | ICD-10-CM | POA: Diagnosis not present

## 2022-12-31 DIAGNOSIS — G4700 Insomnia, unspecified: Secondary | ICD-10-CM | POA: Diagnosis not present

## 2022-12-31 DIAGNOSIS — E785 Hyperlipidemia, unspecified: Secondary | ICD-10-CM | POA: Diagnosis not present

## 2022-12-31 DIAGNOSIS — R5381 Other malaise: Secondary | ICD-10-CM | POA: Diagnosis not present

## 2022-12-31 DIAGNOSIS — I10 Essential (primary) hypertension: Secondary | ICD-10-CM | POA: Diagnosis not present

## 2022-12-31 DIAGNOSIS — D559 Anemia due to enzyme disorder, unspecified: Secondary | ICD-10-CM | POA: Diagnosis not present

## 2022-12-31 DIAGNOSIS — E559 Vitamin D deficiency, unspecified: Secondary | ICD-10-CM | POA: Diagnosis not present

## 2022-12-31 DIAGNOSIS — R279 Unspecified lack of coordination: Secondary | ICD-10-CM | POA: Diagnosis not present

## 2022-12-31 DIAGNOSIS — G43109 Migraine with aura, not intractable, without status migrainosus: Secondary | ICD-10-CM | POA: Diagnosis not present

## 2022-12-31 DIAGNOSIS — I69311 Memory deficit following cerebral infarction: Secondary | ICD-10-CM | POA: Diagnosis not present

## 2022-12-31 DIAGNOSIS — G4709 Other insomnia: Secondary | ICD-10-CM | POA: Diagnosis not present

## 2022-12-31 DIAGNOSIS — I69320 Aphasia following cerebral infarction: Secondary | ICD-10-CM | POA: Diagnosis not present

## 2022-12-31 DIAGNOSIS — N189 Chronic kidney disease, unspecified: Secondary | ICD-10-CM | POA: Diagnosis not present

## 2022-12-31 MED ORDER — TRAMADOL HCL 50 MG PO TABS: 50.0000 mg | ORAL_TABLET | Freq: Four times a day (QID) | ORAL | Status: DC | PRN

## 2022-12-31 MED ORDER — TIZANIDINE HCL 4 MG PO TABS: 4.0000 mg | ORAL_TABLET | Freq: Three times a day (TID) | ORAL | 0 refills | Status: AC | PRN

## 2022-12-31 MED ORDER — SENNOSIDES-DOCUSATE SODIUM 8.6-50 MG PO TABS: 1.0000 | ORAL_TABLET | Freq: Every evening | ORAL | Status: DC | PRN

## 2022-12-31 MED ORDER — DIVALPROEX SODIUM ER 500 MG PO TB24: 500.0000 mg | ORAL_TABLET | Freq: Every morning | ORAL | Status: DC

## 2022-12-31 MED ORDER — FLEET ENEMA 7-19 GM/118ML RE ENEM
1.0000 | ENEMA | Freq: Once | RECTAL | Status: AC
  Administered 2022-12-31: 1 via RECTAL
  Filled 2022-12-31: qty 1

## 2022-12-31 MED ORDER — ASPIRIN 81 MG PO CHEW: 81.0000 mg | CHEWABLE_TABLET | Freq: Every day | ORAL | Status: AC

## 2022-12-31 MED ORDER — BISACODYL 10 MG RE SUPP
10.0000 mg | Freq: Once | RECTAL | Status: DC
  Filled 2022-12-31: qty 1

## 2022-12-31 MED ORDER — PANTOPRAZOLE SODIUM 40 MG PO TBEC: 40.0000 mg | DELAYED_RELEASE_TABLET | Freq: Every day | ORAL | Status: DC

## 2022-12-31 MED ORDER — OXYCODONE HCL 5 MG PO TABS: 5.0000 mg | ORAL_TABLET | Freq: Three times a day (TID) | ORAL | 0 refills | Status: DC | PRN

## 2022-12-31 MED ORDER — POLYETHYLENE GLYCOL 3350 17 G PO PACK: 17.0000 g | PACK | Freq: Every day | ORAL | 0 refills | Status: DC | PRN

## 2022-12-31 NOTE — TOC Transition Note (Signed)
Transition of Care [facility]) - CM/SW Discharge Note   Patient Details  Name: Tonya Boyd MRN: [id number] Date of Birth: July 04, 1956  Transition of Care [facility]) CM/SW Contact:  Deatra Robinson, [place] Phone Number: 12/31/2022, 1:13 PM   Clinical Narrative: Pt for dc to [facility] today. Insurance auth received and SW confirmed with Revonda Standard at [place] they are prepared to admit pt today. Pt aware of dc and reports agreeable. RN provided with number for report and PTAR arranged for transport. SW signing off at dc.   Dellie Burns, MSW, LCSW [phone] (coverage)        Final next level of care: Skilled Nursing Facility Barriers to Discharge: No Barriers Identified   Patient Goals and CMS Choice CMS Medicare.gov Compare Post Acute Care list provided to:: Patient Choice offered to / list presented to : Patient  Discharge Placement                Patient chooses bed at: [facility] Patient to be transferred to facility by: PTAR Name of family member notified: Pt to update husband Patient and family notified of of transfer: 12/31/22  Discharge Plan and Services Additional resources added to the After Visit Summary for   In-house Referral: Clinical Social Work Discharge Planning Services: CM Consult Post Acute Care Choice: Skilled Nursing Facility                               Social Determinants of Health (SDOH) Interventions SDOH Screenings   Food Insecurity: No Food Insecurity (12/28/2022)  Housing: Low Risk  (12/28/2022)  Transportation Needs: No Transportation Needs (12/28/2022)  Utilities: Not At Risk (12/28/2022)  Tobacco Use: Low Risk  (12/26/2022)     Readmission Risk Interventions     No data to display

## 2022-12-31 NOTE — Progress Notes (Signed)
   12/30/22 2130  Provider Notification  Provider Name/Title Dr Iver Nestle  Date Provider Notified 12/30/22  Time Provider Notified 2130  Method of Notification Page  Notification Reason Other (Comment) (Pt request her Symbicot inhaler, also no BM since Sunday 4/21)  Provider response See new orders  Date of Provider Response 12/30/22  Time of Provider Response 2135

## 2022-12-31 NOTE — Progress Notes (Signed)
Physical Therapy Treatment Patient Details Name: Tonya Boyd MRN: [id number] DOB: 1955/10/21 Today's Date: 12/31/2022   History of Present Illness Pt is 67 yo female who presented to [facility] on 12/26/22 with R sided weakness and speech abnormalities, received TNK at 1245. MRI neg for acute infarct.  PMH: CVA, CAD, CKD, asth,a. COPD, HTN, gout, Raynauds, recent L THA    PT Comments    Pt continues to demonstrate bil lower extremity weakness through knee buckling and needing modA for transfers and to steady herself when taking steps with a RW. Pt reporting and appearing anxious with standing mobility, often lifting her R foot to stand in single leg stance on her L leg alone due to the odd sensation she feels when her R foot is on the floor. However, pt appears nervous to lift her R foot to advance it when trying to take steps, often sliding it instead. Session focused on improving pt's confidence with standing on bil feet. Will continue to follow acutely.     Recommendations for follow up therapy are one component of a multi-disciplinary discharge planning process, led by the attending physician.  Recommendations may be updated based on patient status, additional functional criteria and insurance authorization.  Follow Up Recommendations       Assistance Recommended at Discharge Frequent or constant Supervision/Assistance  Patient can return home with the following Two people to help with walking and/or transfers;A lot of help with bathing/dressing/bathroom;Assistance with cooking/housework;Assist for transportation;Help with stairs or ramp for entrance;Direct supervision/assist for medications management;Direct supervision/assist for financial management   Equipment Recommendations  None recommended by PT (defer to post acute)    Recommendations for Other Services       Precautions / Restrictions Precautions Precautions: Fall Precaution Comments: L posterior hip precautions, watch BP, R  knee buckle Restrictions Weight Bearing Restrictions: No     Mobility  Bed Mobility Overal bed mobility: Needs Assistance Bed Mobility: Sit to Supine       Sit to supine: Min assist, HOB elevated   General bed mobility comments: MinA to lift R leg onto bed with return to supine.    Transfers Overall transfer level: Needs assistance Equipment used: Rolling walker (2 wheels), 1 person hand held assist Transfers: Sit to/from Stand, Bed to chair/wheelchair/BSC Sit to Stand: Mod assist   Step pivot transfers: Mod assist       General transfer comment: x2 sit to stand reps from commode to RW and x3 reps from EOB to RW, with pt pulling up on RW each rep. ModA for balance and to provide intermittent tactile cues at bil knees to reduce buckling. ModA for balance to take steps from commode towards EOB with RW.    Ambulation/Gait Ambulation/Gait assistance: Mod assist Gait Distance (Feet): 4 Feet (x2 bouts of ~3 ft > 4 ft) Assistive device: Rolling walker (2 wheels) Gait Pattern/deviations: Step-through pattern, Decreased stride length, Decreased stance time - right, Decreased dorsiflexion - right, Decreased weight shift to right, Knees buckling, Decreased step length - right Gait velocity: decreased Gait velocity interpretation: <1.31 ft/sec, indicative of household ambulator   General Gait Details: Pt with bil knee buckling ambulating towards EOB with RW first bout. During second bout, cued pt to step anterior <> posterior at EOB, needing tactile cues at either quad to extend during stance and cues/assistance to advance R leg as pt would often slide it when cued or return it to the initial spot. Yet, when pt would want to return to sit she  displayed improved R foot clearance/step ability to step posteriorly towards EOB.   Stairs             Wheelchair Mobility    Modified Rankin (Stroke Patients Only) Modified Rankin (Stroke Patients Only) Pre-Morbid Rankin Score: Slight  disability Modified Rankin: Moderately severe disability     Balance Overall balance assessment: Needs assistance Sitting-balance support: Feet supported, No upper extremity supported Sitting balance-Leahy Scale: Fair     Standing balance support: Reliant on assistive device for balance, Bilateral upper extremity supported Standing balance-Leahy Scale: Poor Standing balance comment: dependent on Bil UE support and external assist. Focused x3 of the 5 standing bouts on just standing statically to reduce pt's anxiety. Pt moving her R leg all around and standing just on her L leg majority of the time 1/3x, reporting she thought it would be good to stand on one leg due to her R foot feeling weird. Pt needing encouragement and asisstance to keep her R foot on the ground and maintain static standing the other x2 reps, up to ~45 sec each bout, modA for stability.                            Cognition Arousal/Alertness: Awake/alert Behavior During Therapy: Impulsive, Anxious Overall Cognitive Status: Impaired/Different from baseline Area of Impairment: Following commands, Memory, Safety/judgement, Attention                   Current Attention Level: Selective Memory: Decreased short-term memory Following Commands: Follows one step commands consistently, Follows one step commands with increased time, Follows multi-step commands inconsistently Safety/Judgement: Decreased awareness of safety     General Comments: Pt anxious with standing, moving her R foot around often even though repeatedly cued to keep it still on the ground while trying to improve her confidence with just standing statically. Pt reporting her R foot feels weird so she thought it would be good to stand on "one leg".        Exercises      General Comments        Pertinent Vitals/Pain Pain Assessment Pain Assessment: Faces Faces Pain Scale: Hurts a little bit Pain Location: generalized with  mobility Pain Descriptors / Indicators: Grimacing, Discomfort Pain Intervention(s): Monitored during session, Limited activity within patient's tolerance, Repositioned    Home Living                          Prior Function            PT Goals (current goals can now be found in the care plan section) Acute Rehab PT Goals Patient Stated Goal: to improve PT Goal Formulation: With patient Time For Goal Achievement: 01/10/23 Potential to Achieve Goals: Good Progress towards PT goals: Progressing toward goals    Frequency    Min 4X/week      PT Plan Current plan remains appropriate    Co-evaluation              AM-PAC PT "6 Clicks" Mobility   Outcome Measure  Help needed turning from your back to your side while in a flat bed without using bedrails?: A Little Help needed moving from lying on your back to sitting on the side of a flat bed without using bedrails?: A Little Help needed moving to and from a bed to a chair (including a wheelchair)?: A Lot Help needed standing up from a chair  using your arms (e.g., wheelchair or bedside chair)?: A Lot Help needed to walk in hospital room?: Total Help needed climbing 3-5 steps with a railing? : Total 6 Click Score: 12    End of Session Equipment Utilized During Treatment: Gait belt Activity Tolerance: Patient tolerated treatment well Patient left: with call bell/phone within reach;in bed;with bed alarm set   PT Visit Diagnosis: Unsteadiness on feet (R26.81);Hemiplegia and hemiparesis;Difficulty in walking, not elsewhere classified (R26.2);Apraxia (R48.2);Pain;Other abnormalities of gait and mobility (R26.89);Muscle weakness (generalized) (M62.81) Hemiplegia - Right/Left: Right Hemiplegia - dominant/non-dominant: Dominant Hemiplegia - caused by: Cerebral infarction Pain - Right/Left: Left Pain - part of body: Hip     Time: 4540-9811 PT Time Calculation (min) (ACUTE ONLY): 22 min  Charges:  $Therapeutic  Activity: 8-22 mins                     Raymond Gurney, PT, DPT Acute Rehabilitation Services  Office: [phone]    Jewel Baize 12/31/2022, 3:35 PM

## 2023-01-04 ENCOUNTER — Telehealth: Payer: Self-pay

## 2023-01-04 NOTE — Telephone Encounter (Signed)
Patient called she had another stroke and is in rehab, they took her off repatha  and she is not on any blood thinner. [facility] she wanted you to know

## 2023-01-04 NOTE — Telephone Encounter (Signed)
They have not taken her off of Repatha but as she is in the rehab she has not received a dose.  After she gets discharged she can restart the medication.

## 2023-01-06 DIAGNOSIS — I69351 Hemiplegia and hemiparesis following cerebral infarction affecting right dominant side: Secondary | ICD-10-CM | POA: Diagnosis not present

## 2023-01-06 DIAGNOSIS — R5381 Other malaise: Secondary | ICD-10-CM | POA: Diagnosis not present

## 2023-01-07 DIAGNOSIS — I69351 Hemiplegia and hemiparesis following cerebral infarction affecting right dominant side: Secondary | ICD-10-CM | POA: Diagnosis not present

## 2023-01-07 DIAGNOSIS — I2510 Atherosclerotic heart disease of native coronary artery without angina pectoris: Secondary | ICD-10-CM | POA: Diagnosis not present

## 2023-01-07 DIAGNOSIS — R5381 Other malaise: Secondary | ICD-10-CM | POA: Diagnosis not present

## 2023-01-07 DIAGNOSIS — I639 Cerebral infarction, unspecified: Secondary | ICD-10-CM | POA: Diagnosis not present

## 2023-01-07 DIAGNOSIS — I10 Essential (primary) hypertension: Secondary | ICD-10-CM | POA: Diagnosis not present

## 2023-01-07 DIAGNOSIS — I509 Heart failure, unspecified: Secondary | ICD-10-CM | POA: Diagnosis not present

## 2023-01-07 DIAGNOSIS — E785 Hyperlipidemia, unspecified: Secondary | ICD-10-CM | POA: Diagnosis not present

## 2023-01-08 ENCOUNTER — Telehealth: Payer: Self-pay

## 2023-01-08 NOTE — Telephone Encounter (Signed)
Not if she is doing well. Not to stop the medications

## 2023-01-08 NOTE — Telephone Encounter (Signed)
Patient says she is doing well now, and will call if there are any changes.. She will also continue the acebutolol.

## 2023-01-08 NOTE — Telephone Encounter (Signed)
Patient calling to let you know that she recently had a stroke and is in rehab. The [facility] facility took her off of acebutolol and her heart rate started to shoot up 160+ laying in bed. So they added it back to her med list and it seems to have resolved. She is wanting to know if you want to see her for this? She goes home from rehab next Friday.   Call back # [phone]

## 2023-01-13 ENCOUNTER — Other Ambulatory Visit: Payer: Self-pay | Admitting: *Deleted

## 2023-01-13 DIAGNOSIS — I2510 Atherosclerotic heart disease of native coronary artery without angina pectoris: Secondary | ICD-10-CM | POA: Diagnosis not present

## 2023-01-13 DIAGNOSIS — I509 Heart failure, unspecified: Secondary | ICD-10-CM | POA: Diagnosis not present

## 2023-01-13 DIAGNOSIS — R5381 Other malaise: Secondary | ICD-10-CM | POA: Diagnosis not present

## 2023-01-13 DIAGNOSIS — I10 Essential (primary) hypertension: Secondary | ICD-10-CM | POA: Diagnosis not present

## 2023-01-13 NOTE — Patient Outreach (Addendum)
Per [facility] Tonya Boyd resides in [facility] skilled nursing facility. Screening for potential [facility] care coordination services as benefit of health plan and Primary Care Provider.    Secure communication sent to [place], SNF social worker,  for collaboration about transition plans and potential [facility] care coordination needs.   Will continue to follow.  Addendum: Update received from Veni, [employer] social worker. Tonya Boyd is slated to return home on 01/15/23 with spouse and sitters.   Will plan outreach to discuss [facility] services.   Raiford Noble, MSN, RN,BSN [facility] Post Acute Care Coordinator [phone] (Direct dial)

## 2023-01-14 DIAGNOSIS — I69351 Hemiplegia and hemiparesis following cerebral infarction affecting right dominant side: Secondary | ICD-10-CM | POA: Diagnosis not present

## 2023-01-14 DIAGNOSIS — I69311 Memory deficit following cerebral infarction: Secondary | ICD-10-CM | POA: Diagnosis not present

## 2023-01-14 DIAGNOSIS — I509 Heart failure, unspecified: Secondary | ICD-10-CM | POA: Diagnosis not present

## 2023-01-14 DIAGNOSIS — R5381 Other malaise: Secondary | ICD-10-CM | POA: Diagnosis not present

## 2023-01-14 DIAGNOSIS — E785 Hyperlipidemia, unspecified: Secondary | ICD-10-CM | POA: Diagnosis not present

## 2023-01-14 DIAGNOSIS — I2510 Atherosclerotic heart disease of native coronary artery without angina pectoris: Secondary | ICD-10-CM | POA: Diagnosis not present

## 2023-01-14 DIAGNOSIS — I639 Cerebral infarction, unspecified: Secondary | ICD-10-CM | POA: Diagnosis not present

## 2023-01-14 DIAGNOSIS — I10 Essential (primary) hypertension: Secondary | ICD-10-CM | POA: Diagnosis not present

## 2023-01-18 DIAGNOSIS — I25110 Atherosclerotic heart disease of native coronary artery with unstable angina pectoris: Secondary | ICD-10-CM | POA: Diagnosis not present

## 2023-01-18 DIAGNOSIS — J4489 Other specified chronic obstructive pulmonary disease: Secondary | ICD-10-CM | POA: Diagnosis not present

## 2023-01-18 DIAGNOSIS — Z87442 Personal history of urinary calculi: Secondary | ICD-10-CM | POA: Diagnosis not present

## 2023-01-18 DIAGNOSIS — N3010 Interstitial cystitis (chronic) without hematuria: Secondary | ICD-10-CM | POA: Diagnosis not present

## 2023-01-18 DIAGNOSIS — M1612 Unilateral primary osteoarthritis, left hip: Secondary | ICD-10-CM | POA: Diagnosis not present

## 2023-01-18 DIAGNOSIS — Z96651 Presence of right artificial knee joint: Secondary | ICD-10-CM | POA: Diagnosis not present

## 2023-01-18 DIAGNOSIS — N189 Chronic kidney disease, unspecified: Secondary | ICD-10-CM | POA: Diagnosis not present

## 2023-01-18 DIAGNOSIS — Z7901 Long term (current) use of anticoagulants: Secondary | ICD-10-CM | POA: Diagnosis not present

## 2023-01-18 DIAGNOSIS — M7630 Iliotibial band syndrome, unspecified leg: Secondary | ICD-10-CM | POA: Diagnosis not present

## 2023-01-18 DIAGNOSIS — M109 Gout, unspecified: Secondary | ICD-10-CM | POA: Diagnosis not present

## 2023-01-18 DIAGNOSIS — M1990 Unspecified osteoarthritis, unspecified site: Secondary | ICD-10-CM | POA: Diagnosis not present

## 2023-01-18 DIAGNOSIS — Z8673 Personal history of transient ischemic attack (TIA), and cerebral infarction without residual deficits: Secondary | ICD-10-CM | POA: Diagnosis not present

## 2023-01-18 DIAGNOSIS — E7800 Pure hypercholesterolemia, unspecified: Secondary | ICD-10-CM | POA: Diagnosis not present

## 2023-01-18 DIAGNOSIS — Z9181 History of falling: Secondary | ICD-10-CM | POA: Diagnosis not present

## 2023-01-18 DIAGNOSIS — F32A Depression, unspecified: Secondary | ICD-10-CM | POA: Diagnosis not present

## 2023-01-18 DIAGNOSIS — I472 Ventricular tachycardia, unspecified: Secondary | ICD-10-CM | POA: Diagnosis not present

## 2023-01-18 DIAGNOSIS — Z96642 Presence of left artificial hip joint: Secondary | ICD-10-CM | POA: Diagnosis not present

## 2023-01-18 DIAGNOSIS — I129 Hypertensive chronic kidney disease with stage 1 through stage 4 chronic kidney disease, or unspecified chronic kidney disease: Secondary | ICD-10-CM | POA: Diagnosis not present

## 2023-01-18 DIAGNOSIS — J4552 Severe persistent asthma with status asthmaticus: Secondary | ICD-10-CM | POA: Diagnosis not present

## 2023-01-18 DIAGNOSIS — I7300 Raynaud's syndrome without gangrene: Secondary | ICD-10-CM | POA: Diagnosis not present

## 2023-01-18 DIAGNOSIS — Z471 Aftercare following joint replacement surgery: Secondary | ICD-10-CM | POA: Diagnosis not present

## 2023-01-19 ENCOUNTER — Other Ambulatory Visit: Payer: Self-pay | Admitting: *Deleted

## 2023-01-19 DIAGNOSIS — I10 Essential (primary) hypertension: Secondary | ICD-10-CM

## 2023-01-19 NOTE — Patient Outreach (Signed)
Per [facility] Tonya Boyd discharged from [facility] skilled nursing facility on 01/15/23. Screening for potential [facility] care coordination services as benefit of health plan and  Primary Care Provider.  Telephone call made to Tonya Boyd [phone]. Patient identifiers confirmed. Tonya Boyd lives at home with spouse. States home health has been out to visit. States she is not sure the home health agency name. Thinks it may be [facility]. Discussed [facility] care coordination services. Tonya Boyd is agreeable. States she could use the additional follow up. Explained to Tonya Boyd [facility] care coordination services will not interfere with home health. States PCP appointment is 02/05/23.  Communication sent to [facility] social worker to confirm home health agency name.   Referral made for RN [facility] care coordination services.   Raiford Noble, MSN, RN,BSN [facility] Post Acute Care Coordinator [phone] (Direct dial)

## 2023-01-20 ENCOUNTER — Telehealth: Payer: Self-pay | Admitting: *Deleted

## 2023-01-20 NOTE — Progress Notes (Signed)
  Care Coordination   Note   01/20/2023 Name: Tonya Boyd MRN: [id number] DOB: [birth year]  [name] is a 67 y.o. year old female who sees Georgianne Fick, MD for primary care. I reached out to [employer] by phone today to offer care coordination services.  [name] was given information about Care Coordination services today including:   The Care Coordination services include support from the care team which includes your Nurse Coordinator, Clinical Social Worker, or Pharmacist.  The Care Coordination team is here to help remove barriers to the health concerns and goals most important to you. Care Coordination services are voluntary, and the patient may decline or stop services at any time by request to their care team member.   Care Coordination Consent Status: Patient did not agree to participate in care coordination services at this time.   Encounter Outcome:  Pt. Refused  [facility] Coordination Care Guide  Direct Dial: [phone]

## 2023-01-21 DIAGNOSIS — I129 Hypertensive chronic kidney disease with stage 1 through stage 4 chronic kidney disease, or unspecified chronic kidney disease: Secondary | ICD-10-CM | POA: Diagnosis not present

## 2023-01-21 DIAGNOSIS — Z9181 History of falling: Secondary | ICD-10-CM | POA: Diagnosis not present

## 2023-01-21 DIAGNOSIS — J4552 Severe persistent asthma with status asthmaticus: Secondary | ICD-10-CM | POA: Diagnosis not present

## 2023-01-21 DIAGNOSIS — Z8673 Personal history of transient ischemic attack (TIA), and cerebral infarction without residual deficits: Secondary | ICD-10-CM | POA: Diagnosis not present

## 2023-01-21 DIAGNOSIS — N3010 Interstitial cystitis (chronic) without hematuria: Secondary | ICD-10-CM | POA: Diagnosis not present

## 2023-01-21 DIAGNOSIS — I472 Ventricular tachycardia, unspecified: Secondary | ICD-10-CM | POA: Diagnosis not present

## 2023-01-21 DIAGNOSIS — M7630 Iliotibial band syndrome, unspecified leg: Secondary | ICD-10-CM | POA: Diagnosis not present

## 2023-01-21 DIAGNOSIS — I7300 Raynaud's syndrome without gangrene: Secondary | ICD-10-CM | POA: Diagnosis not present

## 2023-01-21 DIAGNOSIS — E7800 Pure hypercholesterolemia, unspecified: Secondary | ICD-10-CM | POA: Diagnosis not present

## 2023-01-21 DIAGNOSIS — F32A Depression, unspecified: Secondary | ICD-10-CM | POA: Diagnosis not present

## 2023-01-21 DIAGNOSIS — Z96642 Presence of left artificial hip joint: Secondary | ICD-10-CM | POA: Diagnosis not present

## 2023-01-21 DIAGNOSIS — I25110 Atherosclerotic heart disease of native coronary artery with unstable angina pectoris: Secondary | ICD-10-CM | POA: Diagnosis not present

## 2023-01-21 DIAGNOSIS — N189 Chronic kidney disease, unspecified: Secondary | ICD-10-CM | POA: Diagnosis not present

## 2023-01-21 DIAGNOSIS — M1990 Unspecified osteoarthritis, unspecified site: Secondary | ICD-10-CM | POA: Diagnosis not present

## 2023-01-21 DIAGNOSIS — J4489 Other specified chronic obstructive pulmonary disease: Secondary | ICD-10-CM | POA: Diagnosis not present

## 2023-01-21 DIAGNOSIS — Z471 Aftercare following joint replacement surgery: Secondary | ICD-10-CM | POA: Diagnosis not present

## 2023-01-21 DIAGNOSIS — Z7901 Long term (current) use of anticoagulants: Secondary | ICD-10-CM | POA: Diagnosis not present

## 2023-01-21 DIAGNOSIS — Z96651 Presence of right artificial knee joint: Secondary | ICD-10-CM | POA: Diagnosis not present

## 2023-01-21 DIAGNOSIS — M109 Gout, unspecified: Secondary | ICD-10-CM | POA: Diagnosis not present

## 2023-01-21 DIAGNOSIS — Z87442 Personal history of urinary calculi: Secondary | ICD-10-CM | POA: Diagnosis not present

## 2023-01-25 DIAGNOSIS — I69351 Hemiplegia and hemiparesis following cerebral infarction affecting right dominant side: Secondary | ICD-10-CM | POA: Diagnosis not present

## 2023-01-25 DIAGNOSIS — Z9181 History of falling: Secondary | ICD-10-CM | POA: Diagnosis not present

## 2023-01-25 DIAGNOSIS — M109 Gout, unspecified: Secondary | ICD-10-CM | POA: Diagnosis not present

## 2023-01-25 DIAGNOSIS — Z96651 Presence of right artificial knee joint: Secondary | ICD-10-CM | POA: Diagnosis not present

## 2023-01-25 DIAGNOSIS — I7300 Raynaud's syndrome without gangrene: Secondary | ICD-10-CM | POA: Diagnosis not present

## 2023-01-25 DIAGNOSIS — Z87442 Personal history of urinary calculi: Secondary | ICD-10-CM | POA: Diagnosis not present

## 2023-01-25 DIAGNOSIS — Z471 Aftercare following joint replacement surgery: Secondary | ICD-10-CM | POA: Diagnosis not present

## 2023-01-25 DIAGNOSIS — Z96642 Presence of left artificial hip joint: Secondary | ICD-10-CM | POA: Diagnosis not present

## 2023-01-25 DIAGNOSIS — F32A Depression, unspecified: Secondary | ICD-10-CM | POA: Diagnosis not present

## 2023-01-25 DIAGNOSIS — I25110 Atherosclerotic heart disease of native coronary artery with unstable angina pectoris: Secondary | ICD-10-CM | POA: Diagnosis not present

## 2023-01-25 DIAGNOSIS — J4489 Other specified chronic obstructive pulmonary disease: Secondary | ICD-10-CM | POA: Diagnosis not present

## 2023-01-25 DIAGNOSIS — Z7901 Long term (current) use of anticoagulants: Secondary | ICD-10-CM | POA: Diagnosis not present

## 2023-01-25 DIAGNOSIS — M1990 Unspecified osteoarthritis, unspecified site: Secondary | ICD-10-CM | POA: Diagnosis not present

## 2023-01-25 DIAGNOSIS — E7800 Pure hypercholesterolemia, unspecified: Secondary | ICD-10-CM | POA: Diagnosis not present

## 2023-01-25 DIAGNOSIS — I472 Ventricular tachycardia, unspecified: Secondary | ICD-10-CM | POA: Diagnosis not present

## 2023-01-25 DIAGNOSIS — N189 Chronic kidney disease, unspecified: Secondary | ICD-10-CM | POA: Diagnosis not present

## 2023-01-25 DIAGNOSIS — J4552 Severe persistent asthma with status asthmaticus: Secondary | ICD-10-CM | POA: Diagnosis not present

## 2023-01-25 DIAGNOSIS — M7630 Iliotibial band syndrome, unspecified leg: Secondary | ICD-10-CM | POA: Diagnosis not present

## 2023-01-25 DIAGNOSIS — Z87440 Personal history of urinary (tract) infections: Secondary | ICD-10-CM | POA: Diagnosis not present

## 2023-01-25 DIAGNOSIS — I129 Hypertensive chronic kidney disease with stage 1 through stage 4 chronic kidney disease, or unspecified chronic kidney disease: Secondary | ICD-10-CM | POA: Diagnosis not present

## 2023-01-25 DIAGNOSIS — Z8673 Personal history of transient ischemic attack (TIA), and cerebral infarction without residual deficits: Secondary | ICD-10-CM | POA: Diagnosis not present

## 2023-01-28 DIAGNOSIS — N189 Chronic kidney disease, unspecified: Secondary | ICD-10-CM | POA: Diagnosis not present

## 2023-01-28 DIAGNOSIS — M7630 Iliotibial band syndrome, unspecified leg: Secondary | ICD-10-CM | POA: Diagnosis not present

## 2023-01-28 DIAGNOSIS — Z96651 Presence of right artificial knee joint: Secondary | ICD-10-CM | POA: Diagnosis not present

## 2023-01-28 DIAGNOSIS — Z8673 Personal history of transient ischemic attack (TIA), and cerebral infarction without residual deficits: Secondary | ICD-10-CM | POA: Diagnosis not present

## 2023-01-28 DIAGNOSIS — Z87440 Personal history of urinary (tract) infections: Secondary | ICD-10-CM | POA: Diagnosis not present

## 2023-01-28 DIAGNOSIS — Z471 Aftercare following joint replacement surgery: Secondary | ICD-10-CM | POA: Diagnosis not present

## 2023-01-28 DIAGNOSIS — I129 Hypertensive chronic kidney disease with stage 1 through stage 4 chronic kidney disease, or unspecified chronic kidney disease: Secondary | ICD-10-CM | POA: Diagnosis not present

## 2023-01-28 DIAGNOSIS — F32A Depression, unspecified: Secondary | ICD-10-CM | POA: Diagnosis not present

## 2023-01-28 DIAGNOSIS — J4489 Other specified chronic obstructive pulmonary disease: Secondary | ICD-10-CM | POA: Diagnosis not present

## 2023-01-28 DIAGNOSIS — Z9181 History of falling: Secondary | ICD-10-CM | POA: Diagnosis not present

## 2023-01-28 DIAGNOSIS — E7800 Pure hypercholesterolemia, unspecified: Secondary | ICD-10-CM | POA: Diagnosis not present

## 2023-01-28 DIAGNOSIS — I472 Ventricular tachycardia, unspecified: Secondary | ICD-10-CM | POA: Diagnosis not present

## 2023-01-28 DIAGNOSIS — I25110 Atherosclerotic heart disease of native coronary artery with unstable angina pectoris: Secondary | ICD-10-CM | POA: Diagnosis not present

## 2023-01-28 DIAGNOSIS — I69351 Hemiplegia and hemiparesis following cerebral infarction affecting right dominant side: Secondary | ICD-10-CM | POA: Diagnosis not present

## 2023-01-28 DIAGNOSIS — M1990 Unspecified osteoarthritis, unspecified site: Secondary | ICD-10-CM | POA: Diagnosis not present

## 2023-01-28 DIAGNOSIS — Z7901 Long term (current) use of anticoagulants: Secondary | ICD-10-CM | POA: Diagnosis not present

## 2023-01-28 DIAGNOSIS — M109 Gout, unspecified: Secondary | ICD-10-CM | POA: Diagnosis not present

## 2023-01-28 DIAGNOSIS — Z87442 Personal history of urinary calculi: Secondary | ICD-10-CM | POA: Diagnosis not present

## 2023-01-28 DIAGNOSIS — I7300 Raynaud's syndrome without gangrene: Secondary | ICD-10-CM | POA: Diagnosis not present

## 2023-01-28 DIAGNOSIS — Z96642 Presence of left artificial hip joint: Secondary | ICD-10-CM | POA: Diagnosis not present

## 2023-01-28 DIAGNOSIS — J4552 Severe persistent asthma with status asthmaticus: Secondary | ICD-10-CM | POA: Diagnosis not present

## 2023-01-29 DIAGNOSIS — I25118 Atherosclerotic heart disease of native coronary artery with other forms of angina pectoris: Secondary | ICD-10-CM | POA: Diagnosis not present

## 2023-01-29 DIAGNOSIS — J432 Centrilobular emphysema: Secondary | ICD-10-CM | POA: Diagnosis not present

## 2023-01-29 DIAGNOSIS — N1831 Chronic kidney disease, stage 3a: Secondary | ICD-10-CM | POA: Diagnosis not present

## 2023-01-29 DIAGNOSIS — E782 Mixed hyperlipidemia: Secondary | ICD-10-CM | POA: Diagnosis not present

## 2023-01-29 DIAGNOSIS — I700 Atherosclerosis of aorta: Secondary | ICD-10-CM | POA: Diagnosis not present

## 2023-01-29 DIAGNOSIS — R7303 Prediabetes: Secondary | ICD-10-CM | POA: Diagnosis not present

## 2023-02-03 DIAGNOSIS — Z87442 Personal history of urinary calculi: Secondary | ICD-10-CM | POA: Diagnosis not present

## 2023-02-03 DIAGNOSIS — I7300 Raynaud's syndrome without gangrene: Secondary | ICD-10-CM | POA: Diagnosis not present

## 2023-02-03 DIAGNOSIS — M7630 Iliotibial band syndrome, unspecified leg: Secondary | ICD-10-CM | POA: Diagnosis not present

## 2023-02-03 DIAGNOSIS — Z96651 Presence of right artificial knee joint: Secondary | ICD-10-CM | POA: Diagnosis not present

## 2023-02-03 DIAGNOSIS — I472 Ventricular tachycardia, unspecified: Secondary | ICD-10-CM | POA: Diagnosis not present

## 2023-02-03 DIAGNOSIS — I129 Hypertensive chronic kidney disease with stage 1 through stage 4 chronic kidney disease, or unspecified chronic kidney disease: Secondary | ICD-10-CM | POA: Diagnosis not present

## 2023-02-03 DIAGNOSIS — J4489 Other specified chronic obstructive pulmonary disease: Secondary | ICD-10-CM | POA: Diagnosis not present

## 2023-02-03 DIAGNOSIS — I69351 Hemiplegia and hemiparesis following cerebral infarction affecting right dominant side: Secondary | ICD-10-CM | POA: Diagnosis not present

## 2023-02-03 DIAGNOSIS — Z96642 Presence of left artificial hip joint: Secondary | ICD-10-CM | POA: Diagnosis not present

## 2023-02-03 DIAGNOSIS — F32A Depression, unspecified: Secondary | ICD-10-CM | POA: Diagnosis not present

## 2023-02-03 DIAGNOSIS — E7800 Pure hypercholesterolemia, unspecified: Secondary | ICD-10-CM | POA: Diagnosis not present

## 2023-02-03 DIAGNOSIS — Z7901 Long term (current) use of anticoagulants: Secondary | ICD-10-CM | POA: Diagnosis not present

## 2023-02-03 DIAGNOSIS — Z9181 History of falling: Secondary | ICD-10-CM | POA: Diagnosis not present

## 2023-02-03 DIAGNOSIS — Z471 Aftercare following joint replacement surgery: Secondary | ICD-10-CM | POA: Diagnosis not present

## 2023-02-03 DIAGNOSIS — Z87440 Personal history of urinary (tract) infections: Secondary | ICD-10-CM | POA: Diagnosis not present

## 2023-02-03 DIAGNOSIS — M1990 Unspecified osteoarthritis, unspecified site: Secondary | ICD-10-CM | POA: Diagnosis not present

## 2023-02-03 DIAGNOSIS — M109 Gout, unspecified: Secondary | ICD-10-CM | POA: Diagnosis not present

## 2023-02-03 DIAGNOSIS — Z8673 Personal history of transient ischemic attack (TIA), and cerebral infarction without residual deficits: Secondary | ICD-10-CM | POA: Diagnosis not present

## 2023-02-03 DIAGNOSIS — N189 Chronic kidney disease, unspecified: Secondary | ICD-10-CM | POA: Diagnosis not present

## 2023-02-03 DIAGNOSIS — J4552 Severe persistent asthma with status asthmaticus: Secondary | ICD-10-CM | POA: Diagnosis not present

## 2023-02-03 DIAGNOSIS — I25110 Atherosclerotic heart disease of native coronary artery with unstable angina pectoris: Secondary | ICD-10-CM | POA: Diagnosis not present

## 2023-02-04 DIAGNOSIS — Z96642 Presence of left artificial hip joint: Secondary | ICD-10-CM | POA: Diagnosis not present

## 2023-02-04 DIAGNOSIS — Z96651 Presence of right artificial knee joint: Secondary | ICD-10-CM | POA: Diagnosis not present

## 2023-02-04 DIAGNOSIS — I69351 Hemiplegia and hemiparesis following cerebral infarction affecting right dominant side: Secondary | ICD-10-CM | POA: Diagnosis not present

## 2023-02-04 DIAGNOSIS — Z471 Aftercare following joint replacement surgery: Secondary | ICD-10-CM | POA: Diagnosis not present

## 2023-02-04 DIAGNOSIS — I25110 Atherosclerotic heart disease of native coronary artery with unstable angina pectoris: Secondary | ICD-10-CM | POA: Diagnosis not present

## 2023-02-04 DIAGNOSIS — M1990 Unspecified osteoarthritis, unspecified site: Secondary | ICD-10-CM | POA: Diagnosis not present

## 2023-02-04 DIAGNOSIS — Z9181 History of falling: Secondary | ICD-10-CM | POA: Diagnosis not present

## 2023-02-04 DIAGNOSIS — I472 Ventricular tachycardia, unspecified: Secondary | ICD-10-CM | POA: Diagnosis not present

## 2023-02-04 DIAGNOSIS — Z8673 Personal history of transient ischemic attack (TIA), and cerebral infarction without residual deficits: Secondary | ICD-10-CM | POA: Diagnosis not present

## 2023-02-04 DIAGNOSIS — I129 Hypertensive chronic kidney disease with stage 1 through stage 4 chronic kidney disease, or unspecified chronic kidney disease: Secondary | ICD-10-CM | POA: Diagnosis not present

## 2023-02-04 DIAGNOSIS — M109 Gout, unspecified: Secondary | ICD-10-CM | POA: Diagnosis not present

## 2023-02-04 DIAGNOSIS — I7300 Raynaud's syndrome without gangrene: Secondary | ICD-10-CM | POA: Diagnosis not present

## 2023-02-04 DIAGNOSIS — M7630 Iliotibial band syndrome, unspecified leg: Secondary | ICD-10-CM | POA: Diagnosis not present

## 2023-02-04 DIAGNOSIS — Z7901 Long term (current) use of anticoagulants: Secondary | ICD-10-CM | POA: Diagnosis not present

## 2023-02-04 DIAGNOSIS — Z87442 Personal history of urinary calculi: Secondary | ICD-10-CM | POA: Diagnosis not present

## 2023-02-04 DIAGNOSIS — J4552 Severe persistent asthma with status asthmaticus: Secondary | ICD-10-CM | POA: Diagnosis not present

## 2023-02-04 DIAGNOSIS — E7800 Pure hypercholesterolemia, unspecified: Secondary | ICD-10-CM | POA: Diagnosis not present

## 2023-02-04 DIAGNOSIS — N189 Chronic kidney disease, unspecified: Secondary | ICD-10-CM | POA: Diagnosis not present

## 2023-02-04 DIAGNOSIS — J4489 Other specified chronic obstructive pulmonary disease: Secondary | ICD-10-CM | POA: Diagnosis not present

## 2023-02-04 DIAGNOSIS — F32A Depression, unspecified: Secondary | ICD-10-CM | POA: Diagnosis not present

## 2023-02-04 DIAGNOSIS — Z87440 Personal history of urinary (tract) infections: Secondary | ICD-10-CM | POA: Diagnosis not present

## 2023-02-05 DIAGNOSIS — E7800 Pure hypercholesterolemia, unspecified: Secondary | ICD-10-CM | POA: Diagnosis not present

## 2023-02-05 DIAGNOSIS — M7630 Iliotibial band syndrome, unspecified leg: Secondary | ICD-10-CM | POA: Diagnosis not present

## 2023-02-05 DIAGNOSIS — M1990 Unspecified osteoarthritis, unspecified site: Secondary | ICD-10-CM | POA: Diagnosis not present

## 2023-02-05 DIAGNOSIS — Z471 Aftercare following joint replacement surgery: Secondary | ICD-10-CM | POA: Diagnosis not present

## 2023-02-05 DIAGNOSIS — J4552 Severe persistent asthma with status asthmaticus: Secondary | ICD-10-CM | POA: Diagnosis not present

## 2023-02-05 DIAGNOSIS — Z09 Encounter for follow-up examination after completed treatment for conditions other than malignant neoplasm: Secondary | ICD-10-CM | POA: Diagnosis not present

## 2023-02-05 DIAGNOSIS — G8193 Hemiplegia, unspecified affecting right nondominant side: Secondary | ICD-10-CM | POA: Diagnosis not present

## 2023-02-05 DIAGNOSIS — I25118 Atherosclerotic heart disease of native coronary artery with other forms of angina pectoris: Secondary | ICD-10-CM | POA: Diagnosis not present

## 2023-02-05 DIAGNOSIS — M109 Gout, unspecified: Secondary | ICD-10-CM | POA: Diagnosis not present

## 2023-02-05 DIAGNOSIS — Z96651 Presence of right artificial knee joint: Secondary | ICD-10-CM | POA: Diagnosis not present

## 2023-02-05 DIAGNOSIS — N189 Chronic kidney disease, unspecified: Secondary | ICD-10-CM | POA: Diagnosis not present

## 2023-02-05 DIAGNOSIS — J432 Centrilobular emphysema: Secondary | ICD-10-CM | POA: Diagnosis not present

## 2023-02-05 DIAGNOSIS — Z96642 Presence of left artificial hip joint: Secondary | ICD-10-CM | POA: Diagnosis not present

## 2023-02-05 DIAGNOSIS — I10 Essential (primary) hypertension: Secondary | ICD-10-CM | POA: Diagnosis not present

## 2023-02-05 DIAGNOSIS — N1831 Chronic kidney disease, stage 3a: Secondary | ICD-10-CM | POA: Diagnosis not present

## 2023-02-05 DIAGNOSIS — Z7901 Long term (current) use of anticoagulants: Secondary | ICD-10-CM | POA: Diagnosis not present

## 2023-02-05 DIAGNOSIS — F32A Depression, unspecified: Secondary | ICD-10-CM | POA: Diagnosis not present

## 2023-02-05 DIAGNOSIS — I700 Atherosclerosis of aorta: Secondary | ICD-10-CM | POA: Diagnosis not present

## 2023-02-05 DIAGNOSIS — Z87442 Personal history of urinary calculi: Secondary | ICD-10-CM | POA: Diagnosis not present

## 2023-02-05 DIAGNOSIS — R202 Paresthesia of skin: Secondary | ICD-10-CM | POA: Diagnosis not present

## 2023-02-05 DIAGNOSIS — I25110 Atherosclerotic heart disease of native coronary artery with unstable angina pectoris: Secondary | ICD-10-CM | POA: Diagnosis not present

## 2023-02-05 DIAGNOSIS — R471 Dysarthria and anarthria: Secondary | ICD-10-CM | POA: Diagnosis not present

## 2023-02-05 DIAGNOSIS — R2990 Unspecified symptoms and signs involving the nervous system: Secondary | ICD-10-CM | POA: Diagnosis not present

## 2023-02-05 DIAGNOSIS — J4489 Other specified chronic obstructive pulmonary disease: Secondary | ICD-10-CM | POA: Diagnosis not present

## 2023-02-05 DIAGNOSIS — I7300 Raynaud's syndrome without gangrene: Secondary | ICD-10-CM | POA: Diagnosis not present

## 2023-02-05 DIAGNOSIS — E782 Mixed hyperlipidemia: Secondary | ICD-10-CM | POA: Diagnosis not present

## 2023-02-05 DIAGNOSIS — Z8673 Personal history of transient ischemic attack (TIA), and cerebral infarction without residual deficits: Secondary | ICD-10-CM | POA: Diagnosis not present

## 2023-02-05 DIAGNOSIS — Z87440 Personal history of urinary (tract) infections: Secondary | ICD-10-CM | POA: Diagnosis not present

## 2023-02-05 DIAGNOSIS — Z9181 History of falling: Secondary | ICD-10-CM | POA: Diagnosis not present

## 2023-02-05 DIAGNOSIS — I129 Hypertensive chronic kidney disease with stage 1 through stage 4 chronic kidney disease, or unspecified chronic kidney disease: Secondary | ICD-10-CM | POA: Diagnosis not present

## 2023-02-05 DIAGNOSIS — I472 Ventricular tachycardia, unspecified: Secondary | ICD-10-CM | POA: Diagnosis not present

## 2023-02-05 DIAGNOSIS — I69351 Hemiplegia and hemiparesis following cerebral infarction affecting right dominant side: Secondary | ICD-10-CM | POA: Diagnosis not present

## 2023-02-11 DIAGNOSIS — M1990 Unspecified osteoarthritis, unspecified site: Secondary | ICD-10-CM | POA: Diagnosis not present

## 2023-02-11 DIAGNOSIS — Z87442 Personal history of urinary calculi: Secondary | ICD-10-CM | POA: Diagnosis not present

## 2023-02-11 DIAGNOSIS — Z471 Aftercare following joint replacement surgery: Secondary | ICD-10-CM | POA: Diagnosis not present

## 2023-02-11 DIAGNOSIS — E7800 Pure hypercholesterolemia, unspecified: Secondary | ICD-10-CM | POA: Diagnosis not present

## 2023-02-11 DIAGNOSIS — J4552 Severe persistent asthma with status asthmaticus: Secondary | ICD-10-CM | POA: Diagnosis not present

## 2023-02-11 DIAGNOSIS — I7300 Raynaud's syndrome without gangrene: Secondary | ICD-10-CM | POA: Diagnosis not present

## 2023-02-11 DIAGNOSIS — Z96651 Presence of right artificial knee joint: Secondary | ICD-10-CM | POA: Diagnosis not present

## 2023-02-11 DIAGNOSIS — Z9181 History of falling: Secondary | ICD-10-CM | POA: Diagnosis not present

## 2023-02-11 DIAGNOSIS — Z7901 Long term (current) use of anticoagulants: Secondary | ICD-10-CM | POA: Diagnosis not present

## 2023-02-11 DIAGNOSIS — F32A Depression, unspecified: Secondary | ICD-10-CM | POA: Diagnosis not present

## 2023-02-11 DIAGNOSIS — I25110 Atherosclerotic heart disease of native coronary artery with unstable angina pectoris: Secondary | ICD-10-CM | POA: Diagnosis not present

## 2023-02-11 DIAGNOSIS — I69351 Hemiplegia and hemiparesis following cerebral infarction affecting right dominant side: Secondary | ICD-10-CM | POA: Diagnosis not present

## 2023-02-11 DIAGNOSIS — N189 Chronic kidney disease, unspecified: Secondary | ICD-10-CM | POA: Diagnosis not present

## 2023-02-11 DIAGNOSIS — Z87440 Personal history of urinary (tract) infections: Secondary | ICD-10-CM | POA: Diagnosis not present

## 2023-02-11 DIAGNOSIS — I472 Ventricular tachycardia, unspecified: Secondary | ICD-10-CM | POA: Diagnosis not present

## 2023-02-11 DIAGNOSIS — M109 Gout, unspecified: Secondary | ICD-10-CM | POA: Diagnosis not present

## 2023-02-11 DIAGNOSIS — I129 Hypertensive chronic kidney disease with stage 1 through stage 4 chronic kidney disease, or unspecified chronic kidney disease: Secondary | ICD-10-CM | POA: Diagnosis not present

## 2023-02-11 DIAGNOSIS — M7630 Iliotibial band syndrome, unspecified leg: Secondary | ICD-10-CM | POA: Diagnosis not present

## 2023-02-11 DIAGNOSIS — Z96642 Presence of left artificial hip joint: Secondary | ICD-10-CM | POA: Diagnosis not present

## 2023-02-11 DIAGNOSIS — J4489 Other specified chronic obstructive pulmonary disease: Secondary | ICD-10-CM | POA: Diagnosis not present

## 2023-02-11 DIAGNOSIS — Z8673 Personal history of transient ischemic attack (TIA), and cerebral infarction without residual deficits: Secondary | ICD-10-CM | POA: Diagnosis not present

## 2023-02-16 DIAGNOSIS — Z96642 Presence of left artificial hip joint: Secondary | ICD-10-CM | POA: Diagnosis not present

## 2023-02-16 DIAGNOSIS — N189 Chronic kidney disease, unspecified: Secondary | ICD-10-CM | POA: Diagnosis not present

## 2023-02-16 DIAGNOSIS — Z8673 Personal history of transient ischemic attack (TIA), and cerebral infarction without residual deficits: Secondary | ICD-10-CM | POA: Diagnosis not present

## 2023-02-16 DIAGNOSIS — I69351 Hemiplegia and hemiparesis following cerebral infarction affecting right dominant side: Secondary | ICD-10-CM | POA: Diagnosis not present

## 2023-02-16 DIAGNOSIS — Z9181 History of falling: Secondary | ICD-10-CM | POA: Diagnosis not present

## 2023-02-16 DIAGNOSIS — J4552 Severe persistent asthma with status asthmaticus: Secondary | ICD-10-CM | POA: Diagnosis not present

## 2023-02-16 DIAGNOSIS — Z87440 Personal history of urinary (tract) infections: Secondary | ICD-10-CM | POA: Diagnosis not present

## 2023-02-16 DIAGNOSIS — E7800 Pure hypercholesterolemia, unspecified: Secondary | ICD-10-CM | POA: Diagnosis not present

## 2023-02-16 DIAGNOSIS — M1990 Unspecified osteoarthritis, unspecified site: Secondary | ICD-10-CM | POA: Diagnosis not present

## 2023-02-16 DIAGNOSIS — Z7901 Long term (current) use of anticoagulants: Secondary | ICD-10-CM | POA: Diagnosis not present

## 2023-02-16 DIAGNOSIS — I129 Hypertensive chronic kidney disease with stage 1 through stage 4 chronic kidney disease, or unspecified chronic kidney disease: Secondary | ICD-10-CM | POA: Diagnosis not present

## 2023-02-16 DIAGNOSIS — M7630 Iliotibial band syndrome, unspecified leg: Secondary | ICD-10-CM | POA: Diagnosis not present

## 2023-02-16 DIAGNOSIS — Z87442 Personal history of urinary calculi: Secondary | ICD-10-CM | POA: Diagnosis not present

## 2023-02-16 DIAGNOSIS — J4489 Other specified chronic obstructive pulmonary disease: Secondary | ICD-10-CM | POA: Diagnosis not present

## 2023-02-16 DIAGNOSIS — F32A Depression, unspecified: Secondary | ICD-10-CM | POA: Diagnosis not present

## 2023-02-16 DIAGNOSIS — I25110 Atherosclerotic heart disease of native coronary artery with unstable angina pectoris: Secondary | ICD-10-CM | POA: Diagnosis not present

## 2023-02-16 DIAGNOSIS — I7300 Raynaud's syndrome without gangrene: Secondary | ICD-10-CM | POA: Diagnosis not present

## 2023-02-16 DIAGNOSIS — Z96651 Presence of right artificial knee joint: Secondary | ICD-10-CM | POA: Diagnosis not present

## 2023-02-16 DIAGNOSIS — I472 Ventricular tachycardia, unspecified: Secondary | ICD-10-CM | POA: Diagnosis not present

## 2023-02-16 DIAGNOSIS — Z471 Aftercare following joint replacement surgery: Secondary | ICD-10-CM | POA: Diagnosis not present

## 2023-02-16 DIAGNOSIS — M109 Gout, unspecified: Secondary | ICD-10-CM | POA: Diagnosis not present

## 2023-02-17 DIAGNOSIS — I25110 Atherosclerotic heart disease of native coronary artery with unstable angina pectoris: Secondary | ICD-10-CM | POA: Diagnosis not present

## 2023-02-17 DIAGNOSIS — E7800 Pure hypercholesterolemia, unspecified: Secondary | ICD-10-CM | POA: Diagnosis not present

## 2023-02-17 DIAGNOSIS — Z87442 Personal history of urinary calculi: Secondary | ICD-10-CM | POA: Diagnosis not present

## 2023-02-17 DIAGNOSIS — Z8673 Personal history of transient ischemic attack (TIA), and cerebral infarction without residual deficits: Secondary | ICD-10-CM | POA: Diagnosis not present

## 2023-02-17 DIAGNOSIS — J4489 Other specified chronic obstructive pulmonary disease: Secondary | ICD-10-CM | POA: Diagnosis not present

## 2023-02-17 DIAGNOSIS — I129 Hypertensive chronic kidney disease with stage 1 through stage 4 chronic kidney disease, or unspecified chronic kidney disease: Secondary | ICD-10-CM | POA: Diagnosis not present

## 2023-02-17 DIAGNOSIS — F32A Depression, unspecified: Secondary | ICD-10-CM | POA: Diagnosis not present

## 2023-02-17 DIAGNOSIS — Z471 Aftercare following joint replacement surgery: Secondary | ICD-10-CM | POA: Diagnosis not present

## 2023-02-17 DIAGNOSIS — I7300 Raynaud's syndrome without gangrene: Secondary | ICD-10-CM | POA: Diagnosis not present

## 2023-02-17 DIAGNOSIS — M7630 Iliotibial band syndrome, unspecified leg: Secondary | ICD-10-CM | POA: Diagnosis not present

## 2023-02-17 DIAGNOSIS — M1990 Unspecified osteoarthritis, unspecified site: Secondary | ICD-10-CM | POA: Diagnosis not present

## 2023-02-17 DIAGNOSIS — Z96651 Presence of right artificial knee joint: Secondary | ICD-10-CM | POA: Diagnosis not present

## 2023-02-17 DIAGNOSIS — Z96642 Presence of left artificial hip joint: Secondary | ICD-10-CM | POA: Diagnosis not present

## 2023-02-17 DIAGNOSIS — I69351 Hemiplegia and hemiparesis following cerebral infarction affecting right dominant side: Secondary | ICD-10-CM | POA: Diagnosis not present

## 2023-02-17 DIAGNOSIS — Z9181 History of falling: Secondary | ICD-10-CM | POA: Diagnosis not present

## 2023-02-17 DIAGNOSIS — J4552 Severe persistent asthma with status asthmaticus: Secondary | ICD-10-CM | POA: Diagnosis not present

## 2023-02-17 DIAGNOSIS — N189 Chronic kidney disease, unspecified: Secondary | ICD-10-CM | POA: Diagnosis not present

## 2023-02-17 DIAGNOSIS — Z7901 Long term (current) use of anticoagulants: Secondary | ICD-10-CM | POA: Diagnosis not present

## 2023-02-17 DIAGNOSIS — M109 Gout, unspecified: Secondary | ICD-10-CM | POA: Diagnosis not present

## 2023-02-17 DIAGNOSIS — I472 Ventricular tachycardia, unspecified: Secondary | ICD-10-CM | POA: Diagnosis not present

## 2023-02-17 DIAGNOSIS — Z87440 Personal history of urinary (tract) infections: Secondary | ICD-10-CM | POA: Diagnosis not present

## 2023-02-23 DIAGNOSIS — Z96642 Presence of left artificial hip joint: Secondary | ICD-10-CM | POA: Diagnosis not present

## 2023-02-23 DIAGNOSIS — M7630 Iliotibial band syndrome, unspecified leg: Secondary | ICD-10-CM | POA: Diagnosis not present

## 2023-02-23 DIAGNOSIS — Z96651 Presence of right artificial knee joint: Secondary | ICD-10-CM | POA: Diagnosis not present

## 2023-02-23 DIAGNOSIS — M109 Gout, unspecified: Secondary | ICD-10-CM | POA: Diagnosis not present

## 2023-02-23 DIAGNOSIS — J4489 Other specified chronic obstructive pulmonary disease: Secondary | ICD-10-CM | POA: Diagnosis not present

## 2023-02-23 DIAGNOSIS — Z7901 Long term (current) use of anticoagulants: Secondary | ICD-10-CM | POA: Diagnosis not present

## 2023-02-23 DIAGNOSIS — Z8673 Personal history of transient ischemic attack (TIA), and cerebral infarction without residual deficits: Secondary | ICD-10-CM | POA: Diagnosis not present

## 2023-02-23 DIAGNOSIS — E7800 Pure hypercholesterolemia, unspecified: Secondary | ICD-10-CM | POA: Diagnosis not present

## 2023-02-23 DIAGNOSIS — I25110 Atherosclerotic heart disease of native coronary artery with unstable angina pectoris: Secondary | ICD-10-CM | POA: Diagnosis not present

## 2023-02-23 DIAGNOSIS — J4552 Severe persistent asthma with status asthmaticus: Secondary | ICD-10-CM | POA: Diagnosis not present

## 2023-02-23 DIAGNOSIS — I129 Hypertensive chronic kidney disease with stage 1 through stage 4 chronic kidney disease, or unspecified chronic kidney disease: Secondary | ICD-10-CM | POA: Diagnosis not present

## 2023-02-23 DIAGNOSIS — Z87442 Personal history of urinary calculi: Secondary | ICD-10-CM | POA: Diagnosis not present

## 2023-02-23 DIAGNOSIS — N189 Chronic kidney disease, unspecified: Secondary | ICD-10-CM | POA: Diagnosis not present

## 2023-02-23 DIAGNOSIS — F32A Depression, unspecified: Secondary | ICD-10-CM | POA: Diagnosis not present

## 2023-02-23 DIAGNOSIS — Z87440 Personal history of urinary (tract) infections: Secondary | ICD-10-CM | POA: Diagnosis not present

## 2023-02-23 DIAGNOSIS — Z9181 History of falling: Secondary | ICD-10-CM | POA: Diagnosis not present

## 2023-02-23 DIAGNOSIS — I472 Ventricular tachycardia, unspecified: Secondary | ICD-10-CM | POA: Diagnosis not present

## 2023-02-23 DIAGNOSIS — Z471 Aftercare following joint replacement surgery: Secondary | ICD-10-CM | POA: Diagnosis not present

## 2023-02-23 DIAGNOSIS — I69351 Hemiplegia and hemiparesis following cerebral infarction affecting right dominant side: Secondary | ICD-10-CM | POA: Diagnosis not present

## 2023-02-23 DIAGNOSIS — I7300 Raynaud's syndrome without gangrene: Secondary | ICD-10-CM | POA: Diagnosis not present

## 2023-02-23 DIAGNOSIS — M1990 Unspecified osteoarthritis, unspecified site: Secondary | ICD-10-CM | POA: Diagnosis not present

## 2023-02-26 DIAGNOSIS — E538 Deficiency of other specified B group vitamins: Secondary | ICD-10-CM | POA: Diagnosis not present

## 2023-03-01 ENCOUNTER — Other Ambulatory Visit: Payer: Self-pay | Admitting: Adult Health

## 2023-03-01 ENCOUNTER — Other Ambulatory Visit: Payer: Self-pay | Admitting: Cardiology

## 2023-03-01 NOTE — Telephone Encounter (Signed)
Pt requesting refill

## 2023-03-01 NOTE — Telephone Encounter (Signed)
Can you please call and ask if she is still taking this

## 2023-03-04 DIAGNOSIS — Z8673 Personal history of transient ischemic attack (TIA), and cerebral infarction without residual deficits: Secondary | ICD-10-CM | POA: Diagnosis not present

## 2023-03-04 DIAGNOSIS — Z87440 Personal history of urinary (tract) infections: Secondary | ICD-10-CM | POA: Diagnosis not present

## 2023-03-04 DIAGNOSIS — N189 Chronic kidney disease, unspecified: Secondary | ICD-10-CM | POA: Diagnosis not present

## 2023-03-04 DIAGNOSIS — E7800 Pure hypercholesterolemia, unspecified: Secondary | ICD-10-CM | POA: Diagnosis not present

## 2023-03-04 DIAGNOSIS — Z87442 Personal history of urinary calculi: Secondary | ICD-10-CM | POA: Diagnosis not present

## 2023-03-04 DIAGNOSIS — I472 Ventricular tachycardia, unspecified: Secondary | ICD-10-CM | POA: Diagnosis not present

## 2023-03-04 DIAGNOSIS — Z96651 Presence of right artificial knee joint: Secondary | ICD-10-CM | POA: Diagnosis not present

## 2023-03-04 DIAGNOSIS — M7630 Iliotibial band syndrome, unspecified leg: Secondary | ICD-10-CM | POA: Diagnosis not present

## 2023-03-04 DIAGNOSIS — M1990 Unspecified osteoarthritis, unspecified site: Secondary | ICD-10-CM | POA: Diagnosis not present

## 2023-03-04 DIAGNOSIS — I25110 Atherosclerotic heart disease of native coronary artery with unstable angina pectoris: Secondary | ICD-10-CM | POA: Diagnosis not present

## 2023-03-04 DIAGNOSIS — I7300 Raynaud's syndrome without gangrene: Secondary | ICD-10-CM | POA: Diagnosis not present

## 2023-03-04 DIAGNOSIS — Z7901 Long term (current) use of anticoagulants: Secondary | ICD-10-CM | POA: Diagnosis not present

## 2023-03-04 DIAGNOSIS — F32A Depression, unspecified: Secondary | ICD-10-CM | POA: Diagnosis not present

## 2023-03-04 DIAGNOSIS — Z9181 History of falling: Secondary | ICD-10-CM | POA: Diagnosis not present

## 2023-03-04 DIAGNOSIS — I69351 Hemiplegia and hemiparesis following cerebral infarction affecting right dominant side: Secondary | ICD-10-CM | POA: Diagnosis not present

## 2023-03-04 DIAGNOSIS — J4552 Severe persistent asthma with status asthmaticus: Secondary | ICD-10-CM | POA: Diagnosis not present

## 2023-03-04 DIAGNOSIS — J4489 Other specified chronic obstructive pulmonary disease: Secondary | ICD-10-CM | POA: Diagnosis not present

## 2023-03-04 DIAGNOSIS — I129 Hypertensive chronic kidney disease with stage 1 through stage 4 chronic kidney disease, or unspecified chronic kidney disease: Secondary | ICD-10-CM | POA: Diagnosis not present

## 2023-03-04 DIAGNOSIS — Z96642 Presence of left artificial hip joint: Secondary | ICD-10-CM | POA: Diagnosis not present

## 2023-03-04 DIAGNOSIS — M109 Gout, unspecified: Secondary | ICD-10-CM | POA: Diagnosis not present

## 2023-03-04 DIAGNOSIS — Z471 Aftercare following joint replacement surgery: Secondary | ICD-10-CM | POA: Diagnosis not present

## 2023-03-04 NOTE — Telephone Encounter (Signed)
Yes Patient still taking both ,she said it was stopped by mistake.

## 2023-03-05 DIAGNOSIS — Z96642 Presence of left artificial hip joint: Secondary | ICD-10-CM | POA: Diagnosis not present

## 2023-03-05 DIAGNOSIS — M1612 Unilateral primary osteoarthritis, left hip: Secondary | ICD-10-CM | POA: Diagnosis not present

## 2023-03-09 DIAGNOSIS — M109 Gout, unspecified: Secondary | ICD-10-CM | POA: Diagnosis not present

## 2023-03-09 DIAGNOSIS — I7300 Raynaud's syndrome without gangrene: Secondary | ICD-10-CM | POA: Diagnosis not present

## 2023-03-09 DIAGNOSIS — M7630 Iliotibial band syndrome, unspecified leg: Secondary | ICD-10-CM | POA: Diagnosis not present

## 2023-03-09 DIAGNOSIS — M1990 Unspecified osteoarthritis, unspecified site: Secondary | ICD-10-CM | POA: Diagnosis not present

## 2023-03-09 DIAGNOSIS — E7800 Pure hypercholesterolemia, unspecified: Secondary | ICD-10-CM | POA: Diagnosis not present

## 2023-03-09 DIAGNOSIS — I472 Ventricular tachycardia, unspecified: Secondary | ICD-10-CM | POA: Diagnosis not present

## 2023-03-09 DIAGNOSIS — Z471 Aftercare following joint replacement surgery: Secondary | ICD-10-CM | POA: Diagnosis not present

## 2023-03-09 DIAGNOSIS — Z8673 Personal history of transient ischemic attack (TIA), and cerebral infarction without residual deficits: Secondary | ICD-10-CM | POA: Diagnosis not present

## 2023-03-09 DIAGNOSIS — Z87440 Personal history of urinary (tract) infections: Secondary | ICD-10-CM | POA: Diagnosis not present

## 2023-03-09 DIAGNOSIS — I25110 Atherosclerotic heart disease of native coronary artery with unstable angina pectoris: Secondary | ICD-10-CM | POA: Diagnosis not present

## 2023-03-09 DIAGNOSIS — Z7901 Long term (current) use of anticoagulants: Secondary | ICD-10-CM | POA: Diagnosis not present

## 2023-03-09 DIAGNOSIS — Z9181 History of falling: Secondary | ICD-10-CM | POA: Diagnosis not present

## 2023-03-09 DIAGNOSIS — J4552 Severe persistent asthma with status asthmaticus: Secondary | ICD-10-CM | POA: Diagnosis not present

## 2023-03-09 DIAGNOSIS — J4489 Other specified chronic obstructive pulmonary disease: Secondary | ICD-10-CM | POA: Diagnosis not present

## 2023-03-09 DIAGNOSIS — Z96651 Presence of right artificial knee joint: Secondary | ICD-10-CM | POA: Diagnosis not present

## 2023-03-09 DIAGNOSIS — I129 Hypertensive chronic kidney disease with stage 1 through stage 4 chronic kidney disease, or unspecified chronic kidney disease: Secondary | ICD-10-CM | POA: Diagnosis not present

## 2023-03-09 DIAGNOSIS — I69351 Hemiplegia and hemiparesis following cerebral infarction affecting right dominant side: Secondary | ICD-10-CM | POA: Diagnosis not present

## 2023-03-09 DIAGNOSIS — Z96642 Presence of left artificial hip joint: Secondary | ICD-10-CM | POA: Diagnosis not present

## 2023-03-09 DIAGNOSIS — F32A Depression, unspecified: Secondary | ICD-10-CM | POA: Diagnosis not present

## 2023-03-09 DIAGNOSIS — Z87442 Personal history of urinary calculi: Secondary | ICD-10-CM | POA: Diagnosis not present

## 2023-03-09 DIAGNOSIS — N189 Chronic kidney disease, unspecified: Secondary | ICD-10-CM | POA: Diagnosis not present

## 2023-03-10 ENCOUNTER — Other Ambulatory Visit: Payer: Self-pay | Admitting: Cardiology

## 2023-03-19 DIAGNOSIS — M7630 Iliotibial band syndrome, unspecified leg: Secondary | ICD-10-CM | POA: Diagnosis not present

## 2023-03-19 DIAGNOSIS — M109 Gout, unspecified: Secondary | ICD-10-CM | POA: Diagnosis not present

## 2023-03-19 DIAGNOSIS — I7300 Raynaud's syndrome without gangrene: Secondary | ICD-10-CM | POA: Diagnosis not present

## 2023-03-19 DIAGNOSIS — Z87442 Personal history of urinary calculi: Secondary | ICD-10-CM | POA: Diagnosis not present

## 2023-03-19 DIAGNOSIS — N189 Chronic kidney disease, unspecified: Secondary | ICD-10-CM | POA: Diagnosis not present

## 2023-03-19 DIAGNOSIS — Z87440 Personal history of urinary (tract) infections: Secondary | ICD-10-CM | POA: Diagnosis not present

## 2023-03-19 DIAGNOSIS — I69351 Hemiplegia and hemiparesis following cerebral infarction affecting right dominant side: Secondary | ICD-10-CM | POA: Diagnosis not present

## 2023-03-19 DIAGNOSIS — Z9181 History of falling: Secondary | ICD-10-CM | POA: Diagnosis not present

## 2023-03-19 DIAGNOSIS — I472 Ventricular tachycardia, unspecified: Secondary | ICD-10-CM | POA: Diagnosis not present

## 2023-03-19 DIAGNOSIS — Z7901 Long term (current) use of anticoagulants: Secondary | ICD-10-CM | POA: Diagnosis not present

## 2023-03-19 DIAGNOSIS — F32A Depression, unspecified: Secondary | ICD-10-CM | POA: Diagnosis not present

## 2023-03-19 DIAGNOSIS — Z8673 Personal history of transient ischemic attack (TIA), and cerebral infarction without residual deficits: Secondary | ICD-10-CM | POA: Diagnosis not present

## 2023-03-19 DIAGNOSIS — J4489 Other specified chronic obstructive pulmonary disease: Secondary | ICD-10-CM | POA: Diagnosis not present

## 2023-03-19 DIAGNOSIS — I129 Hypertensive chronic kidney disease with stage 1 through stage 4 chronic kidney disease, or unspecified chronic kidney disease: Secondary | ICD-10-CM | POA: Diagnosis not present

## 2023-03-19 DIAGNOSIS — E7800 Pure hypercholesterolemia, unspecified: Secondary | ICD-10-CM | POA: Diagnosis not present

## 2023-03-19 DIAGNOSIS — Z471 Aftercare following joint replacement surgery: Secondary | ICD-10-CM | POA: Diagnosis not present

## 2023-03-19 DIAGNOSIS — M1990 Unspecified osteoarthritis, unspecified site: Secondary | ICD-10-CM | POA: Diagnosis not present

## 2023-03-19 DIAGNOSIS — Z96651 Presence of right artificial knee joint: Secondary | ICD-10-CM | POA: Diagnosis not present

## 2023-03-19 DIAGNOSIS — I25110 Atherosclerotic heart disease of native coronary artery with unstable angina pectoris: Secondary | ICD-10-CM | POA: Diagnosis not present

## 2023-03-19 DIAGNOSIS — J4552 Severe persistent asthma with status asthmaticus: Secondary | ICD-10-CM | POA: Diagnosis not present

## 2023-03-19 DIAGNOSIS — Z96642 Presence of left artificial hip joint: Secondary | ICD-10-CM | POA: Diagnosis not present

## 2023-03-29 DIAGNOSIS — M7732 Calcaneal spur, left foot: Secondary | ICD-10-CM | POA: Diagnosis not present

## 2023-03-29 DIAGNOSIS — M722 Plantar fascial fibromatosis: Secondary | ICD-10-CM | POA: Diagnosis not present

## 2023-03-29 DIAGNOSIS — M71571 Other bursitis, not elsewhere classified, right ankle and foot: Secondary | ICD-10-CM | POA: Diagnosis not present

## 2023-03-29 DIAGNOSIS — M71572 Other bursitis, not elsewhere classified, left ankle and foot: Secondary | ICD-10-CM | POA: Diagnosis not present

## 2023-03-29 DIAGNOSIS — M79672 Pain in left foot: Secondary | ICD-10-CM | POA: Diagnosis not present

## 2023-03-29 DIAGNOSIS — M7731 Calcaneal spur, right foot: Secondary | ICD-10-CM | POA: Diagnosis not present

## 2023-03-29 DIAGNOSIS — M79671 Pain in right foot: Secondary | ICD-10-CM | POA: Diagnosis not present

## 2023-04-01 ENCOUNTER — Encounter: Payer: Self-pay | Admitting: Neurology

## 2023-04-01 ENCOUNTER — Ambulatory Visit: Payer: Medicare Other | Admitting: Neurology

## 2023-04-01 VITALS — BP 134/79 | HR 86 | Ht 64.0 in | Wt 203.4 lb

## 2023-04-01 DIAGNOSIS — R2990 Unspecified symptoms and signs involving the nervous system: Secondary | ICD-10-CM

## 2023-04-01 DIAGNOSIS — R200 Anesthesia of skin: Secondary | ICD-10-CM

## 2023-04-01 DIAGNOSIS — R202 Paresthesia of skin: Secondary | ICD-10-CM

## 2023-04-01 DIAGNOSIS — Z8669 Personal history of other diseases of the nervous system and sense organs: Secondary | ICD-10-CM

## 2023-04-01 MED ORDER — DIVALPROEX SODIUM ER 500 MG PO TB24: 500.0000 mg | ORAL_TABLET | Freq: Every morning | ORAL | 1 refills | Status: DC

## 2023-04-01 NOTE — Patient Instructions (Signed)
I had a long d/w patient about his recent stroke like episode, risk for recurrent stroke/TIAs, personally independently reviewed imaging studies and stroke evaluation results and answered questions.Continue aspirin 81 mg daily  for secondary stroke prevention and maintain strict control of hypertension with blood pressure goal below 130/90, diabetes with hemoglobin A1c goal below 6.5% and lipids with LDL cholesterol goal below 70 mg/dL. I also advised the patient to eat a healthy diet with plenty of whole grains, cereals, fruits and vegetables, exercise regularly and maintain ideal body weight.  Continue Depakote ER 500 mg daily for headache prophylaxis.  Followup in the future with my nurse practitioner in 6 months or call earlier if necessary

## 2023-04-01 NOTE — Progress Notes (Signed)
[facility] [address] [place]. [place] [address] [phone]       OFFICE FOLLOW UP NOTE  Tonya Boyd Date of Birth:  Oct 04, 1955 Medical Record Number:  [id number]   Primary neurologist: Dr. Pearlean Brownie Reason for Referral: Stroke like episode, headaches   Chief Complaint  Patient presents with   Follow-up    Patient in room #19 and alone. Patient states she here to f/u with her hx of stroke. Patient in a good mood today nd she mention PT is going well and she walking again.       HPI:  Update 04/01/2023 : She returns for follow-up after last visit with Shanda Bumps 10 months ago.  She was recently hospitalized in April 2024 for another strokelike episode.  She presented to [facility], ED on 12/26/2022 with sudden onset of right-sided weakness and speech difficulties.  She was evaluated by teleneurology and found to have NIH stroke scale of 12 and given IV TNK and transferred to [facility] for further workup.  CT head on admission was unremarkable with aspect score of 10.  CT angiogram of the head and neck showed no large vessel stenosis or occlusion.  MRI scan showed no acute infarct.  2D echo showed ejection fraction of 60 to 65% with mild concentric left ventricular hypertrophy.  LDL cholesterol was 50 mg percent.  Hemoglobin A1c was 5.2.  Patient showed gradual improvement and was transferred for rehabilitation to facility in [place].  She stayed there for 2 weeks and has had gradual improvement.  She states right upper extremity strength is improved almost completely though she still has some weakness in the right leg.  She still has persistent right-sided sensory loss which is longstanding.  She is now able to ambulate independently and has just finished home physical and Occupational Therapy.  She states her headaches are much better now but they had increased after the stroke and she had increased the Depakote ER from 250 mg daily to 500 mg daily which she is  tolerating well and seems to be working well for her.  She is living at home with her husband and is fully independent in all activities of daily living.  She is tolerating aspirin well without bruising or bleeding and she gets Repatha injections for her cholesterol. Update 06/30/2022 JM: Patient returns for yearly follow-up visit unaccompanied.  Overall stable from neurological standpoint without any new neurological symptoms.  Reports continued mild right-sided weakness and speech difficulty.  She remains on Social Security disability.  Remains on Depakote ER 250 mg daily for headache prophylaxis, has not experienced any recent headaches.  Compliant on aspirin, Repatha and Zetia.  Blood pressure well controlled. S/p R total knee arthroplasty back in September, recovering well and feels like her right leg is stronger now, closely being followed by orthopedics.  She has been enjoying going to the senior center twice weekly as well as more recently doing ceramic paintings and crocheting.  No new concerns at this time.    History provided for reference purposes only Update 06/30/2021 JM: Returns for overdue 75-month follow-up unaccompanied. She missed prior visit due to having COVID in August - recovered well. Overall stable from neurological standpoint without new neurological symptoms.  She reports continued occasional right arm weakness (difficulty gripping) and occasional pain. Recently obtained R AFO brace for right foot weakness which she reports has been ongoing since her stroke. Did have a right foot fracture 12/2020 but feels like she recovered well form  this. Routinely followed by ortho for right knee pain. Continues to use cane at all times - denies any recent falls. She remains on social security disability. Remains on Depakote ER 250 mg daily - denies side effects. She has only had 2-3 headaches since prior visit. Will improve after closing eyes and use of ice. Compliant on aspirin, Repatha and Zetia  without side effects as well as plavix for s/p proximal RCA and ostial PDA stenting 04/08/2021 by Dr. Jacinto Halim.  Continues to be routinely followed by cardiology. Blood pressure today 150/90. Is not currently monitoring at home. Medications changes recently due to hypotension.  No further concerns at this time.  Update 11/04/2020 JM: Tonya Boyd returns for scheduled 75-month follow-up.  Doing well from a stroke standpoint without new or reoccurring stroke/TIA symptoms Residual right-sided weakness, speech impairment and cognitive impairment stable  Reports compliance on aspirin 81 mg daily without any bruising Reports compliance on Repatha and cardiology recently initiated Zetia 10 mg daily -denies side effects and plans on repeating lipid panel in April with PCP Blood pressure today 144/89  On Depakote ER 250 mg daily for migraine prophylaxis She will occassionally have migraines which have slightly worsened over the past few months likely in setting of family stressors including the passing of her brother at the end of January after being told of a cancer diagnosis in December as well as putting her dog down.  She does have family support as well as close friends and very active in her church.  No new concerns at this time.  Update 05/07/2020 JM: Tonya Boyd returns for stroke like episode follow-up unaccompanied Continues to experience right hemiparesis, speech impairment and cognitive deficit which have been stable She did have formal neurocognitive evaluation by Dr. 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  Reported changes to speech and language, reduced encoding and new learning, slowed processing speed, lateralized motor deficits and memory dysfunction are likely residual symptoms of prior infarct. Denies new stroke/TIA symptoms Continues on aspirin 81 mg daily and Repatha for secondary stroke prevention without side effects Dr. Jacinto Halim manages repatha  and did recently have lipid panel completed with LDL 64 Blood pressure today 140/85 -monitors at home which has been stable Remains on Depakote ER 250 mg daily for migraine prophylaxis.  She denies any recent migraine occurrence. Does have have occasional mild headaches but typically associated with motion Was having increased headaches likely due to elevated blood pressure and after medication adjustments by Dr. Jacinto Halim, blood pressures improved and worsening headaches resolved No further concerns at this time  Virtual visit 01/01/2020 JM: Tonya Boyd is is being seen via virtual visit for follow-up regarding prior strokelike episode in 06/2019.  Residual symptoms of right sided weakness, speech impairment with childlike quality and cognitive deficit which have been stable without worsening. Evaluated by Dr. Kieth Brightly on 12/26/2019 and plans on undergoing neurocognitive evaluation today.  Continues on Depakote 250 mg daily for ongoing migraine prophylaxis with benefit.  Previously on 500 mg dosage and denies any worsening with lowering dose.  Mild occasional headaches but denies migrainous headaches.  Continues on aspirin 81 mg daily and Repatha for stroke prevention.  Continues to follow with Dr. Jacinto Halim, cardiology, routinely.  No concerns at this time.  Update 08/15/2019: Tonya Boyd is a 67 year old female who is being seen today for follow-up accompanied by her husband.  She continues to have speech difficulty with hesitancy and childlike quality.  She also continues to complain of  cognition and memory difficulties with getting confused easily as well as subjective right-sided weakness and numbness.  She continues to ambulate with a cane and does endorse a couple falls due to tripping on her right foot due to reported weakness.  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  Currently on short-term disability by PCP and plans on pursuing disability in the  near future.  She has since completed therapies with reports of improvement of prior deficits.  After prior visit, underwent EEG which was normal. She was started on Depakote 500 mg daily at prior visit due to potential complicated migraine versus seizure activity which she continues on without difficulty.  She denies reoccurring headaches or worsening of symptoms.  She does endorse occasional depression or sadness which has been more present thinking of her daughter that passed away 5 years ago.  She also endorses increased stress/anxiety at times that can worsen her cognition and speech.  She continues on aspirin 81 mg daily without bleeding or bruising.  Previously prescribed Repatha but due to joint pain, this has been discontinued and plans on following with cardiology for initiation of a different type of cholesterol lowering agent.    Continues to follow with cardiology regularly for HTN and HLD management.  Blood pressure today 125/82.  No further concerns at this time.    Initial visit 06/15/2019 Dr. Pearlean Brownie: Tonya Boyd is a 67 year old Caucasian lady seen today for initial office consultation visit.  She is accompanied by her husband.  History is obtained from them, review of electronic medical records and I personally reviewed imaging films in PACS.  She has past medical history of hypertension, hyperlipidemia, coronary artery disease, kidney stones, hiatal hernia, gastroesophageal reflux disease, chronic bronchitis.  She states she had episode in early September when she woke up she felt she could not think right and she had brain fog she was off balance numbness on the right side of the face and tongue as well as right hand she is went and saw her primary care physician who ordered an outpatient MRI scan of the brain done on [facility]/17/[facility] which I personally reviewed showed no acute abnormalities.  Mild changes of chronic small vessel disease only noted.  She was seen in the ER on 06/08/2019  with sudden onset of speech change and right-sided weakness.  She also had some chest pain in the morning and took some nitroglycerin.  She also had a headache.  She states she does not get many headaches.  CT scan of the head in the ER was unremarkable.  She had some subjective speech difficulties and right-sided weakness with giveaway weakness as per Dr. Alene Mires exam.  MRI scan of the brain was obtained which showed no acute abnormality and only changes of small vessel disease.  MRI of the brain showed no significant large vessel stenosis..  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   Patient states symptoms have persisted since then.  She is able to speak now but has trouble speaking and speaks in with a childlike quality to her speech.  Still has subjective weakness and numbness on the right side but is able to walk without assistance.  She states she cannot think because she has brain fog and will not be able to return to work.  She feels better.  She denies any prior known history of strokes TIAs or seizures.  She does  take aspirin every day and is on medication for blood pressure and cholesterol for her coronary artery disease.  She denies prior history of migraines or headaches with neurological symptoms.  She denies any significant ongoing stress in her life.  Her daughter died 5 years ago but she states she is coping with that.  Patient feels she is unable to work because she gets confused and the brain cannot think right.  She has in fact not even been driving.      ROS:   14 system review of systems is positive for those listed in HPI, all other systems negative   PMH:  Past Medical History:  Diagnosis Date   Angina pectoris (HCC)    Anxiety    Asthma    CAD (coronary artery disease), native coronary artery 08/02/2018   a.) [facility] 08/02/2018: EF 45%, super-dominant large RCA, 80% p-mRCA, 50% oRPDA, 80% D1; mPA 13, mPCWP 8, PA sat 81%, Ao sat 99, CO  6.95, CI 3.65 --> 4.0 x 30 mm Orsiro DES x1 to [facility]; b.) [facility] 10/07/2018: 50-60% oD1 (FFR 0.96), diffuse HG stenosis prox seg very small (<1 mm) RI --> med mgmt; c.) [facility] 04/08/2021: 80% pRCA (4.0 x 16 mm Synergy XD DES) and 80% oPDA (2.5 x 12 mm Synergy XD DES)   Chronic bronchitis (HCC)    Chronic kidney disease    Complication of anesthesia    a.) postoperative nausea only (no vomiting); also "shakes"; per patient relieved w/ IV diphenhydramine   COPD (chronic obstructive pulmonary disease) (HCC)    Depression    Diastolic dysfunction 07/23/2018   a.) TTE 07/23/2018: EF 55-60%, GLS -19.6%, G1DD; b.) TTE 11/01/2020: EF 55%, triv AR, G1DD   Family history of adverse reaction to anesthesia    "cousin stopped breathing; he was allergic to the anesthesia" (08/02/2018)   GERD (gastroesophageal reflux disease)    High cholesterol    History of gout    History of hiatal hernia    History of kidney stones    Hypertension 07/2018   Interstitial cystitis    Long term current use of antithrombotics/antiplatelets    a.) daily DAPT therapy (ASA + clopidogrel)   Migraines    NSVT (nonsustained ventricular tachycardia) (HCC) 07/2018   a.) holter 07/13/2018: 6 beat run NSVT; maximum rate 133 bpm.   OA (osteoarthritis)    PONV (postoperative nausea and vomiting)    Raynaud's disease    Stroke-like episode 06/08/2019   a.) speech difficulty, right sided weakness/paraesthesias; unclear etiology ?? possibly conversion disorder. MRI brain x 2 negative for stroke.    Social History:  Social History   Socioeconomic History   Marital status: Married    Spouse name: Casimiro Needle   Number of children: 3   Years of education: GED   Highest education level: Not on file  Occupational History    Employer: [employer]  Tobacco Use   Smoking status: Never   Smokeless tobacco: Never  Vaping Use   Vaping status: Never Used  Substance and Sexual Activity   Alcohol use: Not Currently     Comment: 08/02/2018 "not since my 20's"   Drug use: Never   Sexual activity: Not Currently  Other Topics Concern   Not on file  Social History Narrative   One child deceased   Social Determinants of Health   Financial Resource Strain: Not on file  Food Insecurity: No Food Insecurity (12/28/2022)   Hunger Vital Sign    Worried About Running  Out of Food in the Last Year: Never true    Ran Out of Food in the Last Year: Never true  Transportation Needs: No Transportation Needs (12/28/2022)   PRAPARE - Administrator, Civil Service (Medical): No    Lack of Transportation (Non-Medical): No  Physical Activity: Not on file  Stress: Not on file  Social Connections: Unknown (01/18/2022)   [facility]   Social Network    Social Network: Not on file  Intimate Partner Violence: Not At Risk (12/28/2022)   Humiliation, Afraid, Rape, and Kick questionnaire    Fear of Current or Ex-Partner: No    Emotionally Abused: No    Physically Abused: No    Sexually Abused: No    Medications:   Current Outpatient Medications on File Prior to Visit  Medication Sig Dispense Refill   acebutolol (SECTRAL) 200 MG capsule TAKE 1 CAPSULE BY MOUTH TWICE  DAILY 200 capsule 2   aspirin 81 MG chewable tablet Chew 1 tablet (81 mg total) by mouth daily.     cyanocobalamin (,VITAMIN B-12,) 1000 MCG/ML injection Inject 1,000 mcg into the skin every 30 (thirty) days.     divalproex (DEPAKOTE ER) 500 MG 24 hr tablet Take 1 tablet (500 mg total) by mouth every morning.     isosorbide mononitrate (IMDUR) 30 MG 24 hr tablet Take 1 tablet (30 mg total) by mouth daily. 90 tablet 3   loratadine (CLARITIN) 10 MG tablet Take 10 mg by mouth at bedtime.     losartan (COZAAR) 25 MG tablet Take 1 tablet (25 mg total) by mouth every evening. 90 tablet 3   ondansetron (ZOFRAN) 4 MG tablet Take 1 tablet (4 mg total) by mouth every 6 (six) hours as needed for nausea. 30 tablet 0   pantoprazole (PROTONIX) 20 MG  tablet TAKE 1 TABLET BY MOUTH DAILY 100 tablet 2   REPATHA SURECLICK 140 MG/ML SOAJ INJECT 140 MG INTO THE SKIN EVERY 2 WEEKS. 2 mL 0   SYMBICORT 80-4.5 MCG/ACT inhaler Inhale 2 puffs into the lungs daily as needed (Coughing).     tiZANidine (ZANAFLEX) 2 MG tablet Take 2 mg by mouth every 8 (eight) hours as needed.     tiZANidine (ZANAFLEX) 4 MG tablet Take 1 tablet (4 mg total) by mouth every 8 (eight) hours as needed for muscle spasms. 30 tablet 0   traMADol (ULTRAM) 50 MG tablet Take 1 tablet (50 mg total) by mouth every 6 (six) hours as needed (Breakthrough pain). 30 tablet    traZODone (DESYREL) 100 MG tablet Take 100 mg by mouth at bedtime.     Vitamin D, Ergocalciferol, (DRISDOL) 1.25 MG (50000 UT) CAPS capsule Take 50,000 Units by mouth every Monday.     oxyCODONE (OXY IR/ROXICODONE) 5 MG immediate release tablet Take 1 tablet (5 mg total) by mouth every 8 (eight) hours as needed for severe pain. (Patient not taking: Reported on 04/01/2023) 30 tablet 0   pantoprazole (PROTONIX) 40 MG tablet Take 1 tablet (40 mg total) by mouth daily with supper. (Patient not taking: Reported on 04/01/2023)     polyethylene glycol (MIRALAX / GLYCOLAX) 17 g packet Take 17 g by mouth daily as needed for moderate constipation or severe constipation. (Patient not taking: Reported on 04/01/2023) 14 each 0   senna-docusate (SENOKOT-S) 8.6-50 MG tablet Take 1 tablet by mouth at bedtime as needed for mild constipation. (Patient not taking: Reported on 04/01/2023)     No current facility-administered medications  on file prior to visit.    Allergies:   Allergies  Allergen Reactions   Adhesive [Tape] Other (See Comments)    SKIN WILL TEAR EASILY!!!!   Brilinta [Ticagrelor] Shortness Of Breath   Statins Other (See Comments)    myalgia   Carvedilol     GI upset   Diovan [Valsartan]     Tingling    Hydrochlorothiazide     Urinary frequency   Ibuprofen     Causes bp to go up   Keflex [Cephalexin]     confusion    Morphine And Codeine     Hyper, ineffective    Norvasc [Amlodipine Besylate]     Numbness and tingling    Nsaids     Esophagus became red and swollen (tolerates meloxicam)    Prednisone     Elevated BP   Reglan [Metoclopramide]     Confusion, altered mental status   Singulair [Montelukast] Diarrhea   Temazepam Hives       Physical Exam Today's Vitals   04/01/23 0907  BP: 134/79  Pulse: 86  Weight: 203 lb 6.4 oz (92.3 kg)  Height: 5\' 4"  (1.626 m)   Body mass index is 34.91 kg/m.   General: well developed, well nourished,  pleasant middle-aged Caucasian female, seated, in no evident distress Head: head normocephalic and atraumatic.   Neck: supple with no carotid or supraclavicular bruits Cardiovascular: regular rate and rhythm, no murmurs Musculoskeletal: no deformity Skin: Surgical scar right knee Vascular:  Normal pulses all extremities   Neurologic Exam Mental Status: Awake and fully alert. Childlike quality of speech but no evidence of true dysarthria or aphasia.  Speech fluctuates during visit.  Oriented to place and time. Recent memory impaired and remote memory intact. Attention span, concentration and fund of knowledge appropriate during visit. Mood and affect appropriate.  Cranial Nerves: Pupils equal, briskly reactive to light. Extraocular movements full without nystagmus. Visual fields full to confrontation. Hearing intact. Facial sensation intact. Face, tongue, palate moves normally and symmetrically.  Motor: Normal bulk and tone.  Normal strength left upper and lower extremity.  Unable to appreciate weakness on exam although difficulty fully assessing right  lower extremity due to poor effort and giveaway weakness Sensory.:  Diminished touch, pinprick, position and vibratory sensation on right hemibody including forehead with splitting of midline Coordination: Rapid alternating movements normal in all extremities. Finger-to-nose and heel-to-shin performed  accurately bilaterally.  Gait and Station: Arises from chair without difficulty and without assistance. Stance is normal. Gait demonstrates normal stride length and balance with mild favoring of RLE  Reflexes: 1+ and symmetric. Toes downgoing.        ASSESSMENT/PLAN: 55 year patient with speech difficulty, right sided weakness and numbness from stroke like episode of unclear etiology possibly conversion disorder. MRI brain x 2 negative for stroke. Exam shows nonorganic features with subjective right-sided weakness, giveaway weakness, sensory impairment, abnormal speech and cognitive impairment. Vascular risk factors of chronic migraines, CAD s/p angioplasty to right RCA 07/2018 and s/p stent proximal right RCA and ostial PDA, HTN and HLD with statin intolerance.  Recurrent strokelike episode and April 2024 with right-sided weakness and speech difficulties treated with IV TNK and MRI negative for acute abnormality.   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 I had a long d/w patient about his recent stroke like episode, risk for recurrent stroke/TIAs, personally independently reviewed imaging studies and stroke evaluation results and answered questions.Continue aspirin 81 mg daily  for secondary stroke prevention  and maintain strict control of hypertension with blood pressure goal below 130/90, diabetes with hemoglobin A1c goal below 6.5% and lipids with LDL cholesterol goal below 70 mg/dL. I also advised the patient to eat a healthy diet with plenty of whole grains, cereals, fruits and vegetables, exercise regularly and maintain ideal body weight.  Continue Depakote ER 500 mg daily for headache prophylaxis.  Followup in the future with my nurse practitioner in 6 months or call earlier if necessary Greater than 50% time during this 40-minute visit was spent on counseling and coordination of care about her strokelike episode, history of migraine and discussion about stroke  prevention and treatment and answering questions.  Delia Heady, MD  [facility] [address] [place], [place] [address]  Phone [phone] Fax [phone] Note: This document was prepared with digital dictation and possible smart phrase technology. Any transcriptional errors that result from this process are unintentional.

## 2023-04-05 DIAGNOSIS — M48062 Spinal stenosis, lumbar region with neurogenic claudication: Secondary | ICD-10-CM | POA: Diagnosis not present

## 2023-04-05 DIAGNOSIS — M5416 Radiculopathy, lumbar region: Secondary | ICD-10-CM | POA: Diagnosis not present

## 2023-04-07 ENCOUNTER — Other Ambulatory Visit: Payer: Self-pay | Admitting: Internal Medicine

## 2023-04-07 DIAGNOSIS — Z1231 Encounter for screening mammogram for malignant neoplasm of breast: Secondary | ICD-10-CM

## 2023-04-09 DIAGNOSIS — M722 Plantar fascial fibromatosis: Secondary | ICD-10-CM | POA: Diagnosis not present

## 2023-04-09 DIAGNOSIS — M71572 Other bursitis, not elsewhere classified, left ankle and foot: Secondary | ICD-10-CM | POA: Diagnosis not present

## 2023-05-03 ENCOUNTER — Telehealth: Payer: Self-pay | Admitting: Neurology

## 2023-05-03 MED ORDER — DIVALPROEX SODIUM ER 500 MG PO TB24: 500.0000 mg | ORAL_TABLET | Freq: Every morning | ORAL | 1 refills | Status: DC

## 2023-05-03 NOTE — Telephone Encounter (Signed)
Pt is needing her divalproex (DEPAKOTE ER) 500 MG 24 hr tablet sent to [facility] in [place] for a 90 day supply Mail order pharmacy does not have the medication. Pt would like a call back from RN to make sure this has been done

## 2023-05-03 NOTE — Telephone Encounter (Signed)
Order has been sent for 90 day supply with refill to the pharmacy requested by the pt.

## 2023-05-19 DIAGNOSIS — M25841 Other specified joint disorders, right hand: Secondary | ICD-10-CM | POA: Diagnosis not present

## 2023-05-19 DIAGNOSIS — M67441 Ganglion, right hand: Secondary | ICD-10-CM | POA: Diagnosis not present

## 2023-05-20 ENCOUNTER — Other Ambulatory Visit: Payer: Self-pay

## 2023-05-20 ENCOUNTER — Telehealth: Payer: Self-pay

## 2023-05-20 DIAGNOSIS — I25118 Atherosclerotic heart disease of native coronary artery with other forms of angina pectoris: Secondary | ICD-10-CM

## 2023-05-20 MED ORDER — ISOSORBIDE MONONITRATE ER 60 MG PO TB24: 30.0000 mg | ORAL_TABLET | Freq: Every day | ORAL | Status: DC

## 2023-05-20 MED ORDER — REPATHA SURECLICK 140 MG/ML ~~LOC~~ SOAJ: SUBCUTANEOUS | 0 refills | Status: DC

## 2023-05-20 NOTE — Telephone Encounter (Signed)
Patient has been having some chest pains and heart "spasms" and wants to know if you want to increase her isosorbide? Please advise.

## 2023-05-20 NOTE — Telephone Encounter (Signed)
This encounter was created in error - please disregard.

## 2023-05-28 DIAGNOSIS — I25118 Atherosclerotic heart disease of native coronary artery with other forms of angina pectoris: Secondary | ICD-10-CM | POA: Diagnosis not present

## 2023-05-28 DIAGNOSIS — I700 Atherosclerosis of aorta: Secondary | ICD-10-CM | POA: Diagnosis not present

## 2023-05-28 DIAGNOSIS — I10 Essential (primary) hypertension: Secondary | ICD-10-CM | POA: Diagnosis not present

## 2023-05-28 DIAGNOSIS — R5383 Other fatigue: Secondary | ICD-10-CM | POA: Diagnosis not present

## 2023-05-28 DIAGNOSIS — G8193 Hemiplegia, unspecified affecting right nondominant side: Secondary | ICD-10-CM | POA: Diagnosis not present

## 2023-05-28 DIAGNOSIS — N1831 Chronic kidney disease, stage 3a: Secondary | ICD-10-CM | POA: Diagnosis not present

## 2023-06-07 ENCOUNTER — Telehealth: Payer: Self-pay | Admitting: Cardiology

## 2023-06-07 DIAGNOSIS — I25118 Atherosclerotic heart disease of native coronary artery with other forms of angina pectoris: Secondary | ICD-10-CM

## 2023-06-07 NOTE — Telephone Encounter (Signed)
Spoke with Pt. Pt states she had spoke with Dr. Jerre Simon office pre-move about increased symptoms and a prescription for isosorbide mononitrate 60mg  was called in. The prescription states to take a total of 30mg  daily (1/2 tablet) which was what pt was taking prior. Pt wants to know if she should be taking 1/2 in the am and 1/2 in the pm. Told pt I would verify with Dr Jacinto Halim as there was documentation of the change but not of dosage he would like. Told pt we would reach back out once we had an answer.

## 2023-06-07 NOTE — Telephone Encounter (Signed)
60 mg daily and not BID dosing

## 2023-06-07 NOTE — Telephone Encounter (Signed)
Patient has medication dosage questions  Please call patient back.

## 2023-06-10 MED ORDER — ISOSORBIDE MONONITRATE ER 60 MG PO TB24
60.0000 mg | ORAL_TABLET | Freq: Every day | ORAL | 3 refills | Status: DC
Start: 2023-06-10 — End: 2024-05-22

## 2023-06-10 NOTE — Telephone Encounter (Signed)
Patient is calling back for update. Please advise  

## 2023-06-10 NOTE — Telephone Encounter (Signed)
Called patient back and send updated prescription for Imdur 60 mg by mouth daily. Patient verbalized understanding.

## 2023-06-11 ENCOUNTER — Ambulatory Visit: Payer: Medicare Other

## 2023-06-11 ENCOUNTER — Ambulatory Visit
Admission: RE | Admit: 2023-06-11 | Discharge: 2023-06-11 | Disposition: A | Discharge status: Home / Self Care | Payer: Medicare Other | Source: Ambulatory Visit | Attending: Internal Medicine | Admitting: Internal Medicine

## 2023-06-11 DIAGNOSIS — Z1231 Encounter for screening mammogram for malignant neoplasm of breast: Secondary | ICD-10-CM

## 2023-06-16 ENCOUNTER — Other Ambulatory Visit: Payer: Self-pay | Admitting: Internal Medicine

## 2023-06-16 DIAGNOSIS — R928 Other abnormal and inconclusive findings on diagnostic imaging of breast: Secondary | ICD-10-CM

## 2023-06-21 DIAGNOSIS — R471 Dysarthria and anarthria: Secondary | ICD-10-CM | POA: Diagnosis not present

## 2023-06-21 DIAGNOSIS — G8193 Hemiplegia, unspecified affecting right nondominant side: Secondary | ICD-10-CM | POA: Diagnosis not present

## 2023-06-21 DIAGNOSIS — E782 Mixed hyperlipidemia: Secondary | ICD-10-CM | POA: Diagnosis not present

## 2023-06-21 DIAGNOSIS — N1831 Chronic kidney disease, stage 3a: Secondary | ICD-10-CM | POA: Diagnosis not present

## 2023-06-21 DIAGNOSIS — J432 Centrilobular emphysema: Secondary | ICD-10-CM | POA: Diagnosis not present

## 2023-06-21 DIAGNOSIS — I10 Essential (primary) hypertension: Secondary | ICD-10-CM | POA: Diagnosis not present

## 2023-06-21 DIAGNOSIS — R202 Paresthesia of skin: Secondary | ICD-10-CM | POA: Diagnosis not present

## 2023-06-21 DIAGNOSIS — E538 Deficiency of other specified B group vitamins: Secondary | ICD-10-CM | POA: Diagnosis not present

## 2023-06-21 DIAGNOSIS — I700 Atherosclerosis of aorta: Secondary | ICD-10-CM | POA: Diagnosis not present

## 2023-06-21 DIAGNOSIS — I25118 Atherosclerotic heart disease of native coronary artery with other forms of angina pectoris: Secondary | ICD-10-CM | POA: Diagnosis not present

## 2023-06-21 DIAGNOSIS — Z0000 Encounter for general adult medical examination without abnormal findings: Secondary | ICD-10-CM | POA: Diagnosis not present

## 2023-06-25 DIAGNOSIS — Z96642 Presence of left artificial hip joint: Secondary | ICD-10-CM | POA: Diagnosis not present

## 2023-06-25 DIAGNOSIS — M1711 Unilateral primary osteoarthritis, right knee: Secondary | ICD-10-CM | POA: Diagnosis not present

## 2023-06-25 DIAGNOSIS — M1612 Unilateral primary osteoarthritis, left hip: Secondary | ICD-10-CM | POA: Diagnosis not present

## 2023-06-25 DIAGNOSIS — Z96651 Presence of right artificial knee joint: Secondary | ICD-10-CM | POA: Diagnosis not present

## 2023-06-28 ENCOUNTER — Ambulatory Visit
Admission: RE | Admit: 2023-06-28 | Discharge: 2023-06-28 | Disposition: A | Discharge status: Home / Self Care | Payer: Medicare Other | Source: Ambulatory Visit | Attending: Internal Medicine | Admitting: Internal Medicine

## 2023-06-28 ENCOUNTER — Other Ambulatory Visit: Payer: Self-pay | Admitting: Pharmacist

## 2023-06-28 ENCOUNTER — Other Ambulatory Visit: Payer: Self-pay | Admitting: Internal Medicine

## 2023-06-28 DIAGNOSIS — R922 Inconclusive mammogram: Secondary | ICD-10-CM | POA: Diagnosis not present

## 2023-06-28 DIAGNOSIS — N6310 Unspecified lump in the right breast, unspecified quadrant: Secondary | ICD-10-CM

## 2023-06-28 DIAGNOSIS — N6011 Diffuse cystic mastopathy of right breast: Secondary | ICD-10-CM | POA: Diagnosis not present

## 2023-06-28 DIAGNOSIS — R928 Other abnormal and inconclusive findings on diagnostic imaging of breast: Secondary | ICD-10-CM

## 2023-06-28 DIAGNOSIS — N6313 Unspecified lump in the right breast, lower outer quadrant: Secondary | ICD-10-CM | POA: Diagnosis not present

## 2023-06-28 DIAGNOSIS — N6315 Unspecified lump in the right breast, overlapping quadrants: Secondary | ICD-10-CM | POA: Diagnosis not present

## 2023-06-28 HISTORY — PX: BREAST BIOPSY: SHX20

## 2023-06-28 MED ORDER — REPATHA SURECLICK 140 MG/ML ~~LOC~~ SOAJ: SUBCUTANEOUS | 11 refills | Status: DC

## 2023-06-29 LAB — SURGICAL PATHOLOGY

## 2023-07-01 ENCOUNTER — Ambulatory Visit: Payer: Medicare Other | Admitting: Adult Health

## 2023-07-01 DIAGNOSIS — M67441 Ganglion, right hand: Secondary | ICD-10-CM | POA: Diagnosis not present

## 2023-07-01 DIAGNOSIS — M25841 Other specified joint disorders, right hand: Secondary | ICD-10-CM | POA: Diagnosis not present

## 2023-07-01 DIAGNOSIS — M1812 Unilateral primary osteoarthritis of first carpometacarpal joint, left hand: Secondary | ICD-10-CM | POA: Diagnosis not present

## 2023-07-01 DIAGNOSIS — M654 Radial styloid tenosynovitis [de Quervain]: Secondary | ICD-10-CM | POA: Diagnosis not present

## 2023-07-02 ENCOUNTER — Other Ambulatory Visit: Payer: Self-pay | Admitting: Orthopedic Surgery

## 2023-07-02 ENCOUNTER — Encounter: Payer: Self-pay | Admitting: Pharmacist

## 2023-07-02 DIAGNOSIS — M25841 Other specified joint disorders, right hand: Secondary | ICD-10-CM

## 2023-07-02 DIAGNOSIS — M67441 Ganglion, right hand: Secondary | ICD-10-CM

## 2023-07-02 DIAGNOSIS — M1812 Unilateral primary osteoarthritis of first carpometacarpal joint, left hand: Secondary | ICD-10-CM

## 2023-07-02 DIAGNOSIS — M654 Radial styloid tenosynovitis [de Quervain]: Secondary | ICD-10-CM

## 2023-07-02 NOTE — Telephone Encounter (Signed)
This encounter was created in error - please disregard.

## 2023-07-16 ENCOUNTER — Other Ambulatory Visit: Payer: Self-pay | Admitting: Cardiology

## 2023-07-17 ENCOUNTER — Ambulatory Visit
Admission: RE | Admit: 2023-07-17 | Discharge: 2023-07-17 | Disposition: A | Discharge status: Home / Self Care | Payer: Medicare Other | Source: Ambulatory Visit | Attending: Orthopedic Surgery | Admitting: Orthopedic Surgery

## 2023-07-17 DIAGNOSIS — M67441 Ganglion, right hand: Secondary | ICD-10-CM | POA: Diagnosis not present

## 2023-07-17 DIAGNOSIS — M19031 Primary osteoarthritis, right wrist: Secondary | ICD-10-CM | POA: Diagnosis not present

## 2023-07-17 DIAGNOSIS — M19041 Primary osteoarthritis, right hand: Secondary | ICD-10-CM | POA: Diagnosis not present

## 2023-07-17 DIAGNOSIS — M1812 Unilateral primary osteoarthritis of first carpometacarpal joint, left hand: Secondary | ICD-10-CM

## 2023-07-17 DIAGNOSIS — M654 Radial styloid tenosynovitis [de Quervain]: Secondary | ICD-10-CM

## 2023-07-17 DIAGNOSIS — M25841 Other specified joint disorders, right hand: Secondary | ICD-10-CM

## 2023-07-17 DIAGNOSIS — M1811 Unilateral primary osteoarthritis of first carpometacarpal joint, right hand: Secondary | ICD-10-CM | POA: Diagnosis not present

## 2023-07-22 ENCOUNTER — Ambulatory Visit: Payer: Self-pay | Admitting: Cardiology

## 2023-07-27 DIAGNOSIS — M79672 Pain in left foot: Secondary | ICD-10-CM | POA: Diagnosis not present

## 2023-07-27 DIAGNOSIS — N189 Chronic kidney disease, unspecified: Secondary | ICD-10-CM | POA: Diagnosis not present

## 2023-07-27 DIAGNOSIS — I6389 Other cerebral infarction: Secondary | ICD-10-CM | POA: Diagnosis not present

## 2023-07-27 DIAGNOSIS — M722 Plantar fascial fibromatosis: Secondary | ICD-10-CM | POA: Diagnosis not present

## 2023-08-10 ENCOUNTER — Other Ambulatory Visit: Payer: Self-pay | Admitting: Podiatry

## 2023-08-10 DIAGNOSIS — M722 Plantar fascial fibromatosis: Secondary | ICD-10-CM

## 2023-08-12 ENCOUNTER — Ambulatory Visit
Admission: RE | Admit: 2023-08-12 | Discharge: 2023-08-12 | Disposition: A | Discharge status: Home / Self Care | Payer: Medicare Other | Source: Ambulatory Visit | Attending: Podiatry | Admitting: Podiatry

## 2023-08-12 DIAGNOSIS — M722 Plantar fascial fibromatosis: Secondary | ICD-10-CM

## 2023-08-12 DIAGNOSIS — M7662 Achilles tendinitis, left leg: Secondary | ICD-10-CM | POA: Diagnosis not present

## 2023-08-12 DIAGNOSIS — M79672 Pain in left foot: Secondary | ICD-10-CM | POA: Diagnosis not present

## 2023-08-16 DIAGNOSIS — M25841 Other specified joint disorders, right hand: Secondary | ICD-10-CM | POA: Diagnosis not present

## 2023-08-24 DIAGNOSIS — N189 Chronic kidney disease, unspecified: Secondary | ICD-10-CM | POA: Diagnosis not present

## 2023-08-24 DIAGNOSIS — G5752 Tarsal tunnel syndrome, left lower limb: Secondary | ICD-10-CM | POA: Diagnosis not present

## 2023-08-24 DIAGNOSIS — I6389 Other cerebral infarction: Secondary | ICD-10-CM | POA: Diagnosis not present

## 2023-08-24 DIAGNOSIS — M722 Plantar fascial fibromatosis: Secondary | ICD-10-CM | POA: Diagnosis not present

## 2023-08-25 ENCOUNTER — Ambulatory Visit: Payer: Medicare Other | Attending: Cardiology | Admitting: Cardiology

## 2023-08-25 ENCOUNTER — Encounter: Payer: Self-pay | Admitting: Cardiology

## 2023-08-25 VITALS — BP 138/82 | HR 76 | Resp 16 | Ht 64.0 in | Wt 214.8 lb

## 2023-08-25 DIAGNOSIS — I10 Essential (primary) hypertension: Secondary | ICD-10-CM

## 2023-08-25 DIAGNOSIS — I25118 Atherosclerotic heart disease of native coronary artery with other forms of angina pectoris: Secondary | ICD-10-CM

## 2023-08-25 DIAGNOSIS — E782 Mixed hyperlipidemia: Secondary | ICD-10-CM | POA: Diagnosis not present

## 2023-08-25 DIAGNOSIS — Z01810 Encounter for preprocedural cardiovascular examination: Secondary | ICD-10-CM | POA: Diagnosis not present

## 2023-08-25 NOTE — Patient Instructions (Signed)

## 2023-08-25 NOTE — Progress Notes (Signed)
Cardiology Office Note:  .   Date:  08/25/2023  ID:  Tonya Boyd, DOB 1956/06/01, MRN [id number] PCP: Georgianne Fick, MD  [facility] Providers Cardiologist:  [name], MD   History of Present Illness: .   Tonya Boyd is a 67 y.o.   Discussed the use of AI scribe software for clinical note transcription with the patient, who gave verbal consent to proceed.  History of Present Illness   The patient, with a history of coronary artery disease, stroke, and left hip replacement in March 2024, a month later she presented with neurologic deficit which is MRI negative and previously also she has had "stroke" with speech disturbance felt to be probably functional, presents for preoperative clearance for plantar fasciitis surgery. She underwent a left hip replacement in April of this year, followed by a stroke in the same month.  In May she had "stroke" also report a history of a second stroke, which she believes improved their speech, which had been affected by the first stroke.  She reports no current chest pain or shortness of breath.     Review of Systems  Cardiovascular:  Negative for chest pain, dyspnea on exertion and leg swelling.   Labs   Lab Results  Component Value Date   CHOL 125 12/27/2022   HDL 49 12/27/2022   LDLCALC 50 12/27/2022   TRIG 131 12/27/2022   CHOLHDL 2.6 12/27/2022   Lab Results  Component Value Date   NA 139 12/26/2022   K 4.6 12/26/2022   CO2 27 12/26/2022   GLUCOSE 105 (H) 12/26/2022   BUN 26 (H) 12/26/2022   CREATININE 1.10 (H) 12/26/2022   CALCIUM 8.9 12/26/2022   EGFR 54 (L) 08/21/2022   GFRNONAA 57 (L) 12/26/2022      Latest Ref Rng & Units 12/26/2022   12:22 PM 12/26/2022   12:12 PM 11/25/2022    6:47 AM  BMP  Glucose 70 - 99 mg/dL 782  956  213   BUN 8 - 23 mg/dL 26  22  22    Creatinine 0.44 - 1.00 mg/dL 0.86  5.78  4.69   Sodium 135 - 145 mmol/L 139  137  134   Potassium 3.5 - 5.1 mmol/L 4.6  4.5  4.3   Chloride 98 -  111 mmol/L 100  101  103   CO2 22 - 32 mmol/L  27  25   Calcium 8.9 - 10.3 mg/dL  8.9  8.7       Latest Ref Rng & Units 12/26/2022   12:22 PM 12/26/2022   12:12 PM 11/25/2022    6:47 AM  CBC  WBC 4.0 - 10.5 K/uL  6.9  12.8   Hemoglobin 12.0 - 15.0 g/dL 62.9  52.8  41.3   Hematocrit 36.0 - 46.0 % 39.0  38.0  32.4   Platelets 150 - 400 K/uL  333  291    External Labs:  Labs 05/28/2023:  Total cholesterol 133, triglycerides 199, HDL 55, LDL 46.  TSH normal at 1.360.  A1c 5.4%.  Physical Exam:   VS:  BP 138/82 (BP Location: Left Arm, Patient Position: Sitting, Cuff Size: Large)   Pulse 76   Resp 16   Ht 5\' 4"  (1.626 m)   Wt 214 lb 12.8 oz (97.4 kg) Comment: Patient is wearing a 7lb boot  SpO2 97%   BMI 36.87 kg/m    Wt Readings from Last 3 Encounters:  08/25/23 214 lb 12.8 oz (97.4 kg)  04/01/23 203 lb 6.4 oz (92.3 kg)  12/26/22 219 lb 11.2 oz (99.7 kg)     Physical Exam Neck:     Vascular: No carotid bruit or JVD.  Cardiovascular:     Rate and Rhythm: Normal rate and regular rhythm.     Pulses: Intact distal pulses.     Heart sounds: Normal heart sounds. No murmur heard.    No gallop.  Pulmonary:     Effort: Pulmonary effort is normal.     Breath sounds: Normal breath sounds.  Abdominal:     General: Bowel sounds are normal.     Palpations: Abdomen is soft.  Musculoskeletal:        General: Deformity (left leg in cast and not examined) present.     Right lower leg: No edema.     Studies Reviewed: Marland Kitchen    Left Heart Catheterization 09/04/22:    LV: 151/4, EDP 20 mmHg.  Ao 152/72, mean 109 mmHg.  No pressure gradient across the aortic valve. LVEF 50-55%.    LM: Large vessel, smooth and normal. LAD: Large vessel in the proximal and, gives origin to a very large D1 & D2 and a large SP-1. LAD takes the course of D3 distally. CX: Moderate caliber vessel, smooth and normal. RI: Very tiny vessel, normal. RCA: Dominant vessel.  Gives origin to large PL branch and  large PDA.  Mid RCA 4.0 x 30 mm Orsiro stent (08/02/2020) and a  placed 04/08/2021 widely patent.  Ostial and proximal 4.0 x 16 mm and ostial PDA  2.5 x 12 mm Synergy XD DES is widely patent.      Impression: Widely patent coronary stents placed in the right coronary artery from 2021 and 2022.  Evaluate for noncardiac causes of chest pain.   MRI from hospital and hospital course  ECHOCARDIOGRAM COMPLETE 12/28/2022  1. Left ventricular ejection fraction, by estimation, is 60 to 65%. The left ventricle has normal function. The left ventricle has no regional wall motion abnormalities. There is mild concentric left ventricular hypertrophy. Left ventricular diastolic parameters were normal. 2. Right ventricular systolic function is normal. The right ventricular size is normal. 3. The mitral valve is normal in structure. Trivial mitral valve regurgitation. No evidence of mitral stenosis. 4. The aortic valve is tricuspid. There is mild calcification of the aortic valve. Aortic valve regurgitation is not visualized. Aortic valve sclerosis/calcification is present, without any evidence of aortic stenosis. 5. The inferior vena cava is normal in size with greater than 50% respiratory variability, suggesting right atrial pressure of 3 mmHg. 6. Descending aorta appears dilated on limited imaging. Consider dedicated abdominal u/s to further evalaute.  EKG:    EKG Interpretation Date/Time:  Wednesday August 25 2023 10:45:36 EST Ventricular Rate:  72 PR Interval:  152 QRS Duration:  88 QT Interval:  388 QTC Calculation: 424 R Axis:   -28  Text Interpretation: EKG 08/25/2023: Normal sinus rhythm at rate of 72 bpm, normal axis.  Incomplete right bundle branch block.  Poor R progression, cannot exclude anterolateral infarct old.  Nonspecific T abnormality.  Compared to 12/26/2022, no significant change, heart rate has reduced from 93 bpm. Confirmed by Delrae Rend [phone]) on 08/25/2023 11:10:31 AM      Medications and allergies    Allergies  Allergen Reactions   Adhesive [Tape] Other (See Comments)    SKIN WILL TEAR EASILY!!!!   Brilinta [Ticagrelor] Shortness Of Breath   Statins Other (See Comments)    myalgia   Carvedilol  GI upset   Diovan [Valsartan]     Tingling    Hydrochlorothiazide     Urinary frequency   Ibuprofen     Causes bp to go up   Keflex [Cephalexin]     confusion   Morphine And Codeine     Hyper, ineffective    Norvasc [Amlodipine Besylate]     Numbness and tingling    Nsaids     Esophagus became red and swollen (tolerates meloxicam)    Prednisone     Elevated BP   Reglan [Metoclopramide]     Confusion, altered mental status   Singulair [Montelukast] Diarrhea   Temazepam Hives     Current Outpatient Medications:    acebutolol (SECTRAL) 200 MG capsule, TAKE 1 CAPSULE BY MOUTH TWICE  DAILY, Disp: 200 capsule, Rfl: 2   aspirin 81 MG chewable tablet, Chew 1 tablet (81 mg total) by mouth daily., Disp: , Rfl:    cyanocobalamin (,VITAMIN B-12,) 1000 MCG/ML injection, Inject 1,000 mcg into the skin every 30 (thirty) days., Disp: , Rfl:    divalproex (DEPAKOTE ER) 500 MG 24 hr tablet, Take 1 tablet (500 mg total) by mouth every morning., Disp: 90 tablet, Rfl: 1   Evolocumab (REPATHA SURECLICK) 140 MG/ML SOAJ, INJECT 140 MG INTO THE SKIN EVERY 2 WEEKS., Disp: 2 mL, Rfl: 11   isosorbide mononitrate (IMDUR) 60 MG 24 hr tablet, Take 1 tablet (60 mg total) by mouth daily., Disp: 90 tablet, Rfl: 3   loratadine (CLARITIN) 10 MG tablet, Take 10 mg by mouth at bedtime., Disp: , Rfl:    losartan (COZAAR) 25 MG tablet, Take 1 tablet (25 mg total) by mouth every evening., Disp: 90 tablet, Rfl: 3   nitroGLYCERIN (NITROSTAT) 0.4 MG SL tablet, DISSOLVE 1 TABLET SUBLINGUALLY AS NEEDED FOR CHEST PAIN, MAY REPEAT EVERY 5 MINUTES. AFTER 3 CALL 911., Disp: 25 tablet, Rfl: 0   ondansetron (ZOFRAN) 4 MG tablet, Take 1 tablet (4 mg total) by mouth every 6 (six) hours as  needed for nausea., Disp: 30 tablet, Rfl: 0   SYMBICORT 80-4.5 MCG/ACT inhaler, Inhale 2 puffs into the lungs daily as needed (Coughing)., Disp: , Rfl:    tiZANidine (ZANAFLEX) 2 MG tablet, Take 2 mg by mouth every 8 (eight) hours as needed., Disp: , Rfl:    tiZANidine (ZANAFLEX) 4 MG tablet, Take 1 tablet (4 mg total) by mouth every 8 (eight) hours as needed for muscle spasms., Disp: 30 tablet, Rfl: 0   traMADol (ULTRAM) 50 MG tablet, Take 1 tablet (50 mg total) by mouth every 6 (six) hours as needed (Breakthrough pain)., Disp: 30 tablet, Rfl:    traZODone (DESYREL) 100 MG tablet, Take 100 mg by mouth at bedtime., Disp: , Rfl:    Vitamin D, Ergocalciferol, (DRISDOL) 1.25 MG (50000 UT) CAPS capsule, Take 50,000 Units by mouth every Monday., Disp: , Rfl:    ASSESSMENT AND PLAN: .      ICD-10-CM   1. Coronary artery disease of native artery of native heart with stable angina pectoris (HCC)  I25.118 EKG 12-Lead    2. Essential hypertension  I10     3. Mixed hyperlipidemia  E78.2     4. Pre-operative cardiovascular examination  Z01.810       Assessment and Plan    Coronary Artery Disease Stable on current regimen of Acebutolol, Aspirin, and Imdur 60mg . No new chest pain or shortness of breath. EKG unchanged. -Continue current medications.  Stroke History of stroke in April 2024 with  good recovery. No new neurological symptoms. On Aspirin for secondary prevention.  There is question about functional presentation. -Continue Aspirin daily.  Hyperlipidemia LDL well controlled on Repatha. Triglycerides slightly elevated likely due to weight gain. -Continue Repatha every two weeks. -Advise to cut down on sugar and starch to lower triglycerides.  Hypertension Well controlled on Losartan. Kidney function stable. -Continue Losartan.  Plantar Fasciitis Severe, affecting mobility and contributing to weight gain. Surgery planned within the next 30 days. -Cardiac clearance for surgery  provided.  Weight Gain Likely secondary to decreased mobility due to plantar fasciitis. -Encourage weight loss once mobility improves post plantar fasciitis surgery.  Follow-up in 1 year.   Signed,  [name], MD, [facility] 08/25/2023, 12:25 PM [facility] [address] [place], [place] [address] Phone: [phone]. Fax:  [phone]

## 2023-08-27 DIAGNOSIS — J432 Centrilobular emphysema: Secondary | ICD-10-CM | POA: Diagnosis not present

## 2023-08-27 DIAGNOSIS — E782 Mixed hyperlipidemia: Secondary | ICD-10-CM | POA: Diagnosis not present

## 2023-08-27 DIAGNOSIS — R202 Paresthesia of skin: Secondary | ICD-10-CM | POA: Diagnosis not present

## 2023-08-27 DIAGNOSIS — G8193 Hemiplegia, unspecified affecting right nondominant side: Secondary | ICD-10-CM | POA: Diagnosis not present

## 2023-08-27 DIAGNOSIS — I25118 Atherosclerotic heart disease of native coronary artery with other forms of angina pectoris: Secondary | ICD-10-CM | POA: Diagnosis not present

## 2023-08-27 DIAGNOSIS — E538 Deficiency of other specified B group vitamins: Secondary | ICD-10-CM | POA: Diagnosis not present

## 2023-08-27 DIAGNOSIS — I700 Atherosclerosis of aorta: Secondary | ICD-10-CM | POA: Diagnosis not present

## 2023-08-27 DIAGNOSIS — I10 Essential (primary) hypertension: Secondary | ICD-10-CM | POA: Diagnosis not present

## 2023-08-27 DIAGNOSIS — Z01818 Encounter for other preprocedural examination: Secondary | ICD-10-CM | POA: Diagnosis not present

## 2023-08-27 DIAGNOSIS — N1831 Chronic kidney disease, stage 3a: Secondary | ICD-10-CM | POA: Diagnosis not present

## 2023-08-27 DIAGNOSIS — R2990 Unspecified symptoms and signs involving the nervous system: Secondary | ICD-10-CM | POA: Diagnosis not present

## 2023-09-09 DIAGNOSIS — R300 Dysuria: Secondary | ICD-10-CM | POA: Diagnosis not present

## 2023-09-10 IMAGING — MG DIGITAL SCREENING BREAST BILAT IMPLANT W/ TOMO W/ CAD
8 of 14 series · 8 of 34 positions shown · non-contrast
Comparison: Previous exam(s).

CLINICAL DATA: Screening.

EXAM:
DIGITAL SCREENING BILATERAL MAMMOGRAM WITH IMPLANTS, CAD AND
TOMOSYNTHESIS
TECHNIQUE: Bilateral screening digital craniocaudal and mediolateral oblique
mammograms were obtained. Bilateral screening digital breast
tomosynthesis was performed. The images were evaluated with
computer-aided detection. Standard and/or implant displaced views
were performed.

[R CC]
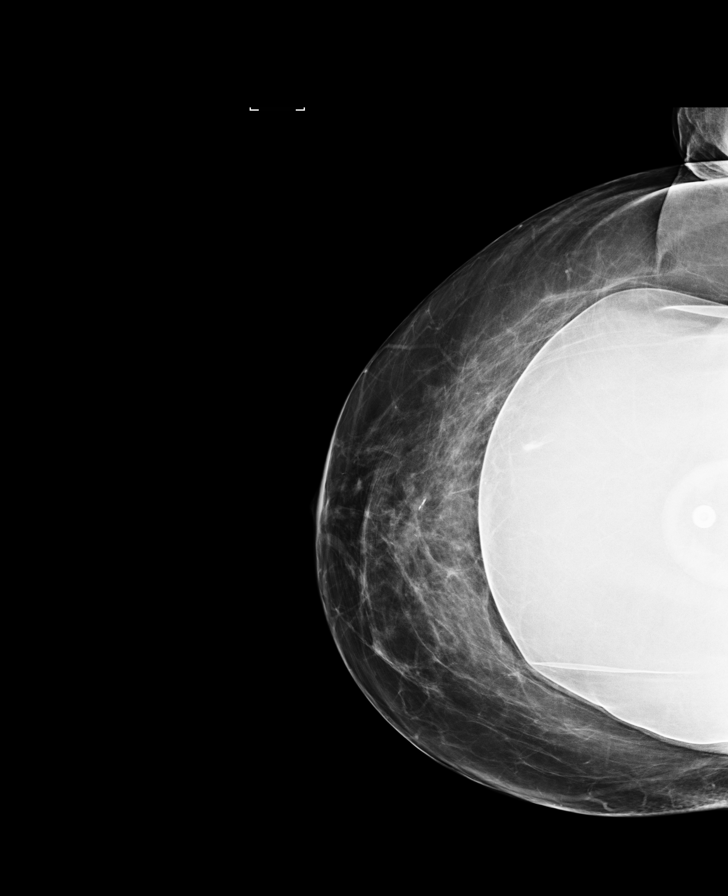

[R MLO]
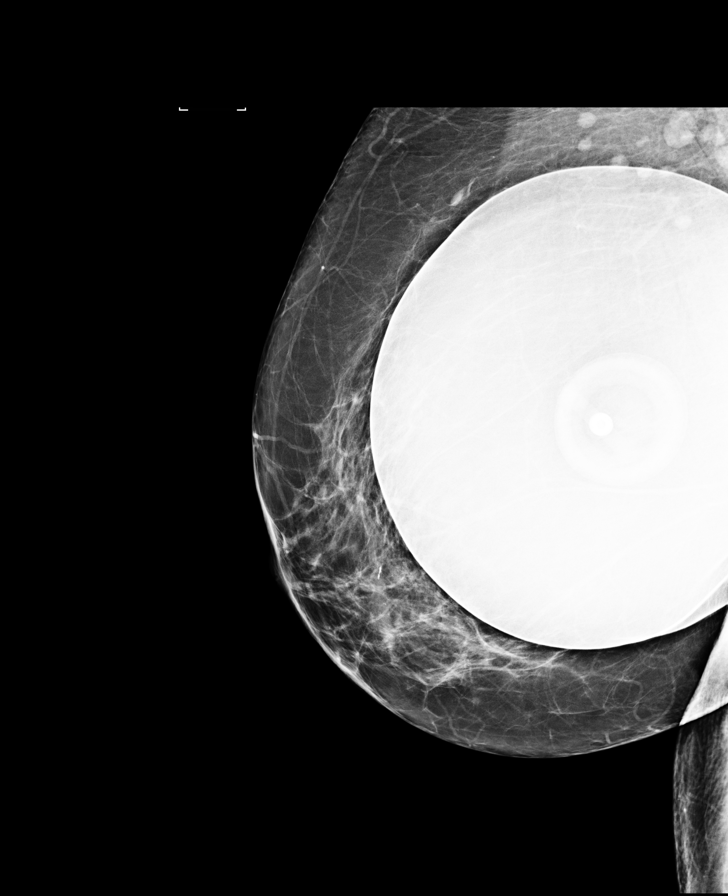

[L MLO]
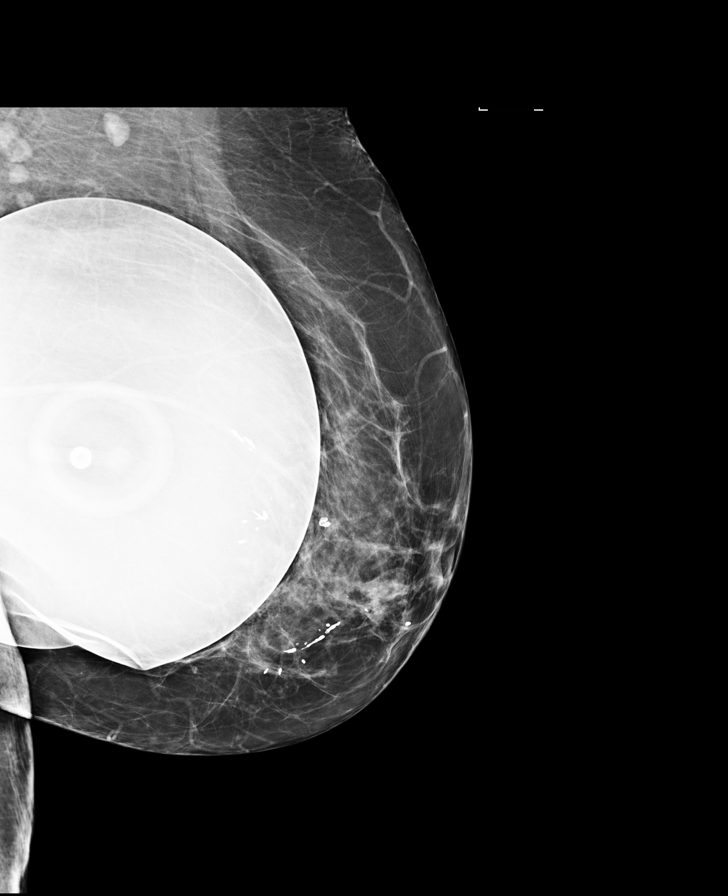

[L CC]
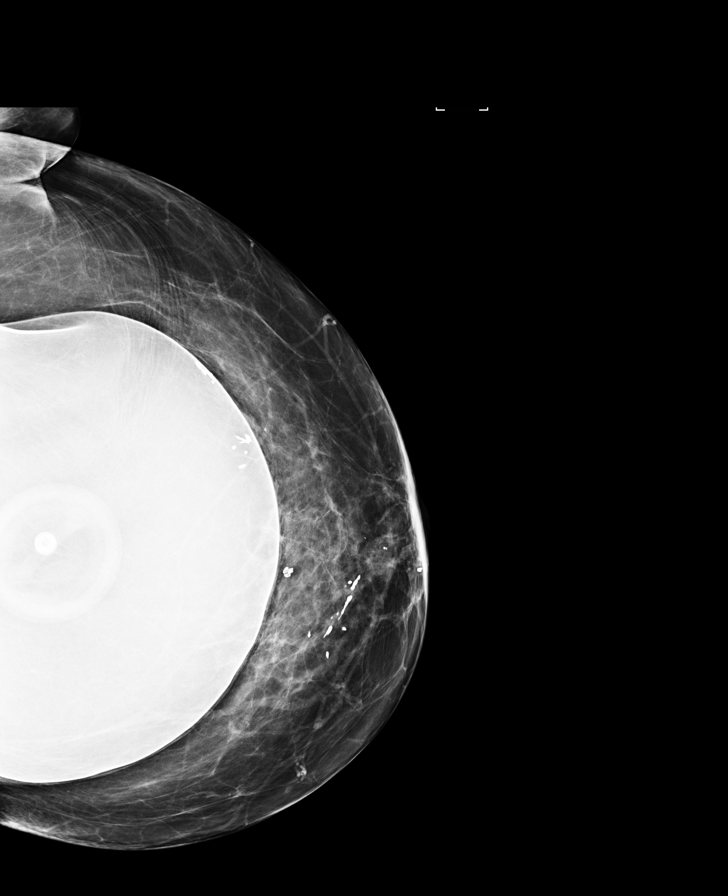

[L MLO synth-2D (1 of 2)]
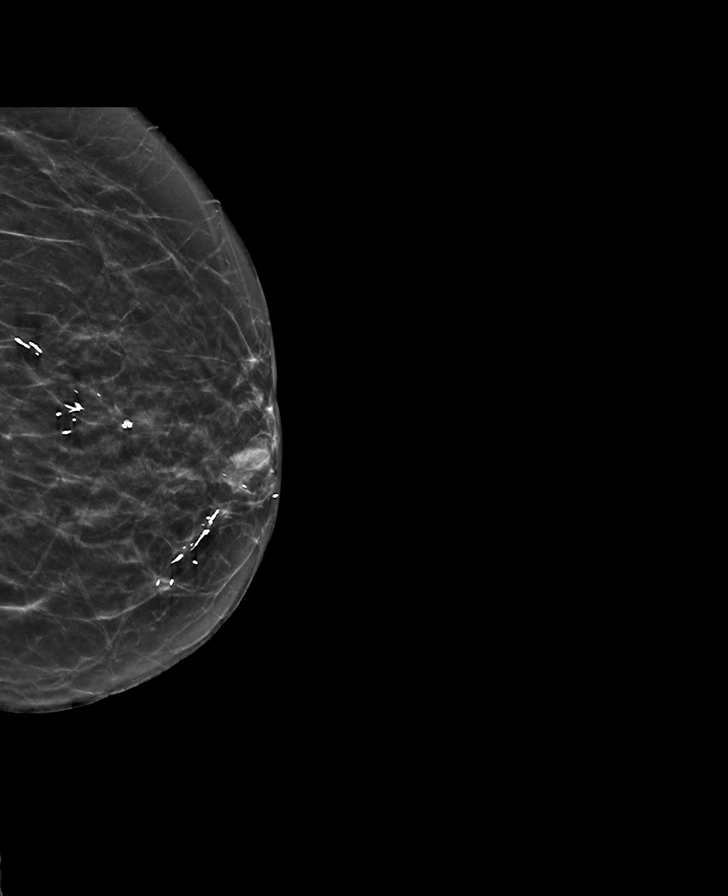

[R CC synth-2D]
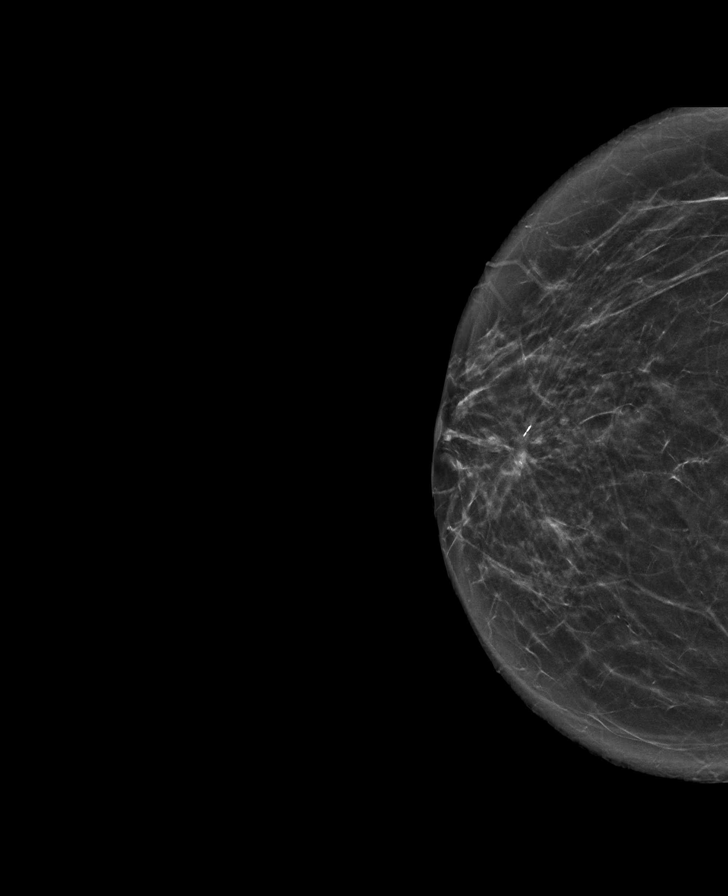

[L MLO synth-2D (2 of 2)]
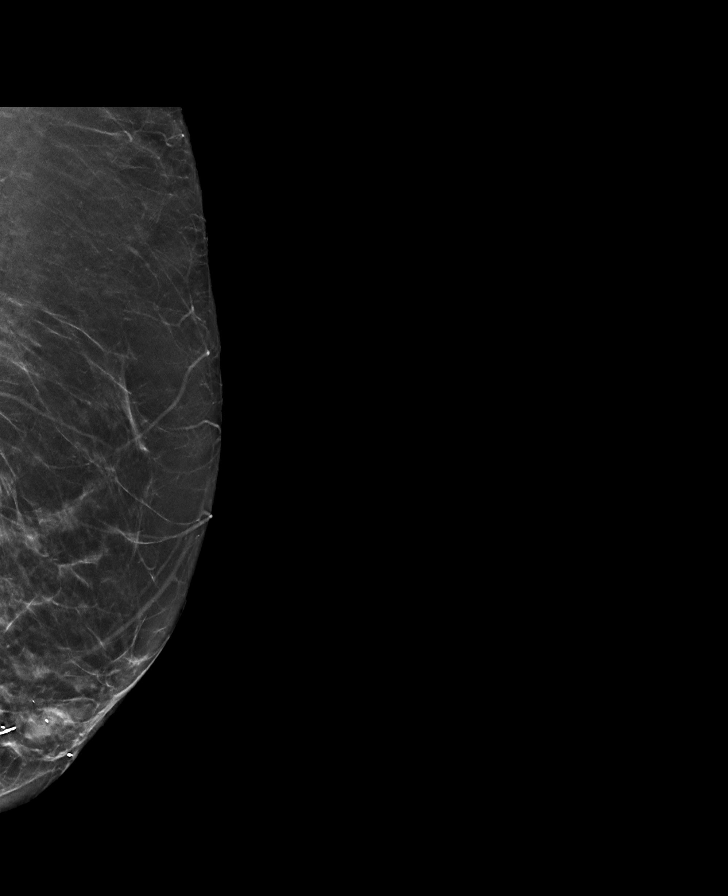

[R MLO synth-2D]
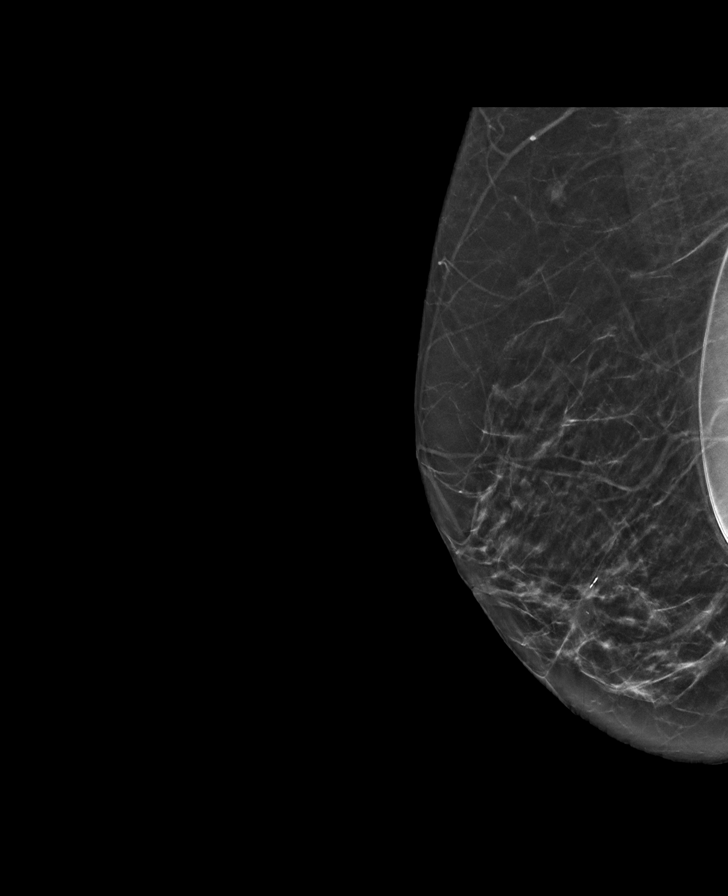

[8 of 34 positions shown; findings below may reference images not displayed]

ACR Breast Density Category b: There are scattered areas of
fibroglandular density.
FINDINGS: The patient has retropectoral implants. There are no findings
suspicious for malignancy.
IMPRESSION: No mammographic evidence of malignancy. A result letter of this
screening mammogram will be mailed directly to the patient.

RECOMMENDATION:
Screening mammogram in one year. (Code:SE-S-JMG)

BI-RADS CATEGORY  1:  Negative.

## 2023-09-14 ENCOUNTER — Other Ambulatory Visit: Payer: Self-pay | Admitting: Podiatry

## 2023-09-16 ENCOUNTER — Encounter: Payer: Self-pay | Admitting: Podiatry

## 2023-09-16 ENCOUNTER — Telehealth: Payer: Self-pay

## 2023-09-16 NOTE — Telephone Encounter (Signed)
   Pre-operative Risk Assessment    Patient Name: Tonya Boyd  DOB: 1955/12/14 MRN: [id number]   Date of last office visit: 08/25/23 Date of next office visit: None   Request for Surgical Clearance    Procedure:  Endoscopic plantar fasciotomy and baxters nerve release left heel   Date of Surgery:  Clearance 09/22/23                                 Surgeon:  Dr.Justin Ashley  Surgeon's Group or Practice Name:  [facility]  Phone number:  [phone] Fax number:  [phone]   Type of Clearance Requested:   - Medical    Type of Anesthesia:  Not Indicated  Additional requests/questions:    SignedConnye GORMAN Hoit   09/16/2023, 2:27 PM

## 2023-09-16 NOTE — Anesthesia Preprocedure Evaluation (Addendum)
 Anesthesia Evaluation  Patient identified by MRN, date of birth, ID band Patient awake    Reviewed: Allergy  & Precautions, H&P , NPO status , Patient's Chart, lab work & pertinent test results  History of Anesthesia Complications (+) PONV, Family history of anesthesia reaction and history of anesthetic complications  Airway Mallampati: III  TM Distance: >3 FB Neck ROM: Full   Comment: Protruding upper front teeth, consider videolaryngoscope use Dental no notable dental hx.  Protruding upper front teeth:   Pulmonary neg pulmonary ROS, asthma , COPD   Pulmonary exam normal breath sounds clear to auscultation       Cardiovascular hypertension, + angina  + CAD and +CHF  Normal cardiovascular exam Rhythm:Regular Rate:Normal  Left Heart Catheterization 09/04/22:    LV: 151/4, EDP 20 mmHg.  Ao 152/72, mean 109 mmHg.  No pressure gradient across the aortic valve. LVEF 50-55%.    LM: Large vessel, smooth and normal. LAD: Large vessel in the proximal and, gives origin to a very large D1 & D2 and a large SP-1. LAD takes the course of D3 distally. CX: Moderate caliber vessel, smooth and normal. RI: Very tiny vessel, normal. RCA: Dominant vessel.  Gives origin to large PL branch and large PDA.  Mid RCA 4.0 x 30 mm Orsiro stent (08/02/2020) and a  placed 04/08/2021 widely patent.  Ostial and proximal 4.0 x 16 mm and ostial PDA  2.5 x 12 mm Synergy XD DES is widely patent.  Cardiac clearance for surgery provided.Signed,  Gordy Bergamo, MD, [facility] 08/25/2023, 12:25 PM [facility]       Impression: Widely patent coronary stents placed in the right coronary artery from 2021 and 2022.  Evaluate for noncardiac causes of chest pain.   MRI from hospital and hospital course  ECHOCARDIOGRAM COMPLETE 12/28/2022  1. Left ventricular ejection fraction, by estimation, is 60 to 65%. The left ventricle has normal function. The left ventricle has no  regional wall motion abnormalities. There is mild concentric left ventricular hypertrophy. Left ventricular diastolic parameters were normal. 2. Right ventricular systolic function is normal. The right ventricular size is normal. 3. The mitral valve is normal in structure. Trivial mitral valve regurgitation. No evidence of mitral stenosis. 4. The aortic valve is tricuspid. There is mild calcification of the aortic valve. Aortic valve regurgitation is not visualized. Aortic valve sclerosis/calcification is present, without any evidence of aortic stenosis. 5. The inferior vena cava is normal in size with greater than 50% respiratory variability, suggesting right atrial pressure of 3 mmHg. 6. Descending aorta appears dilated on limited imaging. Consider dedicated abdominal u/s to further evalaute.    EKG Interpretation Date/Time:                  Wednesday August 25 2023 10:45:36 EST Ventricular Rate:         72 PR Interval:                 152 QRS Duration:             88 QT Interval:                 388 QTC Calculation:424 R Axis:                         -28   Text Interpretation:      EKG 08/25/2023: Normal sinus rhythm at rate of 72 bpm, normal axis.  Incomplete right bundle branch block.  Poor  R progression, cannot exclude anterolateral infarct old.  Nonspecific T abnormality.  Compared to 12/26/2022, no significant change, heart rate has reduced from 93 bpm. Confirmed by Ganji, Jagadeesh [phone]) on 08/25/2023 11:10:31 AM  Signed,   Gordy Bergamo, MD, [facility] 08/25/2023, 12:25 PM [facility]    Neuro/Psych  Headaches PSYCHIATRIC DISORDERS Anxiety Depression    CVA negative neurological ROS  negative psych ROS   GI/Hepatic negative GI ROS, Neg liver ROS, hiatal hernia,GERD  ,,  Endo/Other  negative endocrine ROS    Renal/GU Renal diseasenegative Renal ROS  negative genitourinary   Musculoskeletal negative musculoskeletal ROS (+) Arthritis ,    Abdominal    Peds negative pediatric ROS (+)  Hematology negative hematology ROS (+)   Anesthesia Other Findings Interstitial cystitis  Family history of adverse reaction to anesthesia-cousin has sleep apnea and quit breathing, but then did OK. Patient has had a lot of anesthesia and did well High cholesterol  Hypertension Asthma  NSVT (nonsustained ventricular tachycardia) (HCC) Chronic bronchitis (HCC)  History of hiatal hernia GERD (gastroesophageal reflux disease) Raynaud's disease History of gout History of kidney stones CAD (coronary artery disease), native coronary artery Angina pectoris (HCC) Chronic kidney disease Complication of anesthesia Anxiety  Depression Diastolic dysfunction  Long term current use of antithrombotics/antiplatelets COPD (chronic obstructive pulmonary disease) (HCC)  Stroke-like episode, speech difficulty, right side weakness, possible conversion disorder per Echo OA (osteoarthritis)  Migraines PONV (postoperative nausea and vomiting)   Patient states nothing works for my nausea except Phenergan. We don' t have Phenergan in our formulary, so will administer barhemsys     Reproductive/Obstetrics negative OB ROS                             Anesthesia Physical Anesthesia Plan  ASA: 3  Anesthesia Plan: General ETT   Post-op Pain Management: Regional block   Induction: Intravenous, Rapid sequence and Cricoid pressure planned  PONV Risk Score and Plan:   Airway Management Planned: Oral ETT and Video Laryngoscope Planned  Additional Equipment:   Intra-op Plan:   Post-operative Plan: Extubation in OR  Informed Consent: I have reviewed the patients History and Physical, chart, labs and discussed the procedure including the risks, benefits and alternatives for the proposed anesthesia with the patient or authorized representative who has indicated his/her understanding and acceptance.     Dental Advisory Given  Plan  Discussed with: Anesthesiologist, CRNA and Surgeon  Anesthesia Plan Comments: (Patient consented for risks of anesthesia including but not limited to:  - adverse reactions to medications - damage to eyes, teeth, lips or other oral mucosa - nerve damage due to positioning  - sore throat or hoarseness - Damage to heart, brain, nerves, lungs, other parts of body or loss of life  Patient voiced understanding and assent.)        Anesthesia Quick Evaluation

## 2023-09-16 NOTE — Discharge Instructions (Addendum)
[facility]  POST OPERATIVE INSTRUCTIONS FOR DR. Ether Griffins AND DR. BAKER [facility]   Take your medication as prescribed.  Pain medication should be taken only as needed.  Keep the dressing clean, dry and intact.  Keep your foot elevated above the heart level for the first 48 hours.  We have instructed you to be non-weight bearing.  Always wear your post-op shoe when walking.  Always use your crutches if you are to be non-weight bearing.  Do not take a shower. Baths are permissible as long as the foot is kept out of the water.   Every hour you are awake:  Bend your knee 15 times.   Call [facility] [phone]) if any of the following problems occur: You develop a temperature or fever. The bandage becomes saturated with blood. Medication does not stop your pain. Injury of the foot occurs. Any symptoms of infection including redness, odor, or red streaks running from wound.

## 2023-09-16 NOTE — Telephone Encounter (Signed)
   Name: Tonya Boyd  DOB: 1955-11-16  MRN: [id number]   Primary Cardiologist: Gordy Bergamo, MD  Chart reviewed as part of pre-operative protocol coverage. [name] was last seen on 08/25/2023 by Dr. Bergamo.  Per office visit note Cardiac clearance for surgery provided.  According to the RCRI, patient has a 6.6% risk of MACE. Based on ACC/AHA guidelines, [name] would be at acceptable risk for the planned procedure without further cardiovascular testing.   I will route this recommendation to the requesting party via Epic fax function and remove from pre-op pool. Please call with questions.  [name], NP 09/16/2023, 2:54 PM

## 2023-09-17 DIAGNOSIS — M25562 Pain in left knee: Secondary | ICD-10-CM | POA: Diagnosis not present

## 2023-09-22 ENCOUNTER — Ambulatory Visit
Admission: RE | Admit: 2023-09-22 | Discharge: 2023-09-22 | Disposition: A | Discharge status: Home / Self Care | Payer: Medicare Other | Attending: Podiatry | Admitting: Podiatry

## 2023-09-22 ENCOUNTER — Other Ambulatory Visit: Payer: Self-pay

## 2023-09-22 ENCOUNTER — Encounter: Payer: Self-pay | Admitting: Podiatry

## 2023-09-22 ENCOUNTER — Ambulatory Visit: Payer: Medicare Other | Admitting: Anesthesiology

## 2023-09-22 ENCOUNTER — Encounter
Admission: RE | Disposition: A | Discharge status: Home / Self Care | Payer: Self-pay | Source: Home / Self Care | Attending: Podiatry

## 2023-09-22 DIAGNOSIS — J4489 Other specified chronic obstructive pulmonary disease: Secondary | ICD-10-CM | POA: Insufficient documentation

## 2023-09-22 DIAGNOSIS — M1990 Unspecified osteoarthritis, unspecified site: Secondary | ICD-10-CM | POA: Diagnosis not present

## 2023-09-22 DIAGNOSIS — I6389 Other cerebral infarction: Secondary | ICD-10-CM | POA: Diagnosis not present

## 2023-09-22 DIAGNOSIS — N189 Chronic kidney disease, unspecified: Secondary | ICD-10-CM | POA: Insufficient documentation

## 2023-09-22 DIAGNOSIS — M722 Plantar fascial fibromatosis: Secondary | ICD-10-CM | POA: Insufficient documentation

## 2023-09-22 DIAGNOSIS — I130 Hypertensive heart and chronic kidney disease with heart failure and stage 1 through stage 4 chronic kidney disease, or unspecified chronic kidney disease: Secondary | ICD-10-CM | POA: Insufficient documentation

## 2023-09-22 DIAGNOSIS — F418 Other specified anxiety disorders: Secondary | ICD-10-CM | POA: Diagnosis not present

## 2023-09-22 DIAGNOSIS — I2510 Atherosclerotic heart disease of native coronary artery without angina pectoris: Secondary | ICD-10-CM | POA: Diagnosis not present

## 2023-09-22 DIAGNOSIS — K449 Diaphragmatic hernia without obstruction or gangrene: Secondary | ICD-10-CM | POA: Insufficient documentation

## 2023-09-22 DIAGNOSIS — I4510 Unspecified right bundle-branch block: Secondary | ICD-10-CM | POA: Diagnosis not present

## 2023-09-22 DIAGNOSIS — G5752 Tarsal tunnel syndrome, left lower limb: Secondary | ICD-10-CM | POA: Insufficient documentation

## 2023-09-22 DIAGNOSIS — I509 Heart failure, unspecified: Secondary | ICD-10-CM | POA: Diagnosis not present

## 2023-09-22 DIAGNOSIS — K219 Gastro-esophageal reflux disease without esophagitis: Secondary | ICD-10-CM | POA: Insufficient documentation

## 2023-09-22 DIAGNOSIS — Z8673 Personal history of transient ischemic attack (TIA), and cerebral infarction without residual deficits: Secondary | ICD-10-CM | POA: Insufficient documentation

## 2023-09-22 HISTORY — PX: PLANTAR FASCIA RELEASE: SHX2239

## 2023-09-22 HISTORY — PX: TARSAL TUNNEL RELEASE: SHX5042

## 2023-09-22 SURGERY — RELEASE, FASCIA, PLANTAR
Anesthesia: General | Site: Foot | Laterality: Left

## 2023-09-22 MED ORDER — LIDOCAINE HCL (PF) 2 % IJ SOLN
INTRAMUSCULAR | Status: AC
  Filled 2023-09-22: qty 5

## 2023-09-22 MED ORDER — METOCLOPRAMIDE HCL 5 MG/ML IJ SOLN: 5.0000 mg | Freq: Three times a day (TID) | INTRAMUSCULAR | Status: DC | PRN

## 2023-09-22 MED ORDER — LIDOCAINE HCL (CARDIAC) PF 100 MG/5ML IV SOSY
PREFILLED_SYRINGE | INTRAVENOUS | Status: DC | PRN
  Administered 2023-09-22: 80 mg via INTRAVENOUS

## 2023-09-22 MED ORDER — AMISULPRIDE (ANTIEMETIC) 5 MG/2ML IV SOLN
INTRAVENOUS | Status: AC
  Filled 2023-09-22: qty 2

## 2023-09-22 MED ORDER — AMISULPRIDE (ANTIEMETIC) 5 MG/2ML IV SOLN
5.0000 mg | Freq: Once | INTRAVENOUS | Status: AC
  Administered 2023-09-22: 5 mg via INTRAVENOUS

## 2023-09-22 MED ORDER — PROPOFOL 1000 MG/100ML IV EMUL
INTRAVENOUS | Status: AC
  Filled 2023-09-22: qty 100

## 2023-09-22 MED ORDER — PROPOFOL 500 MG/50ML IV EMUL
INTRAVENOUS | Status: DC | PRN
  Administered 2023-09-22: 140 ug/kg/min via INTRAVENOUS

## 2023-09-22 MED ORDER — FENTANYL CITRATE (PF) 100 MCG/2ML IJ SOLN
INTRAMUSCULAR | Status: AC
  Filled 2023-09-22: qty 2

## 2023-09-22 MED ORDER — FENTANYL CITRATE (PF) 100 MCG/2ML IJ SOLN
INTRAMUSCULAR | Status: DC | PRN
  Administered 2023-09-22 (×2): 50 ug via INTRAVENOUS

## 2023-09-22 MED ORDER — ONDANSETRON HCL 4 MG PO TABS: 4.0000 mg | ORAL_TABLET | Freq: Four times a day (QID) | ORAL | Status: DC | PRN

## 2023-09-22 MED ORDER — SUCCINYLCHOLINE CHLORIDE 200 MG/10ML IV SOSY
PREFILLED_SYRINGE | INTRAVENOUS | Status: DC | PRN
  Administered 2023-09-22: 80 mg via INTRAVENOUS

## 2023-09-22 MED ORDER — BUPIVACAINE HCL (PF) 0.5 % IJ SOLN: INTRAMUSCULAR | Status: DC | PRN

## 2023-09-22 MED ORDER — ONDANSETRON HCL 4 MG/2ML IJ SOLN: 4.0000 mg | Freq: Four times a day (QID) | INTRAMUSCULAR | Status: DC | PRN

## 2023-09-22 MED ORDER — BUPIVACAINE LIPOSOME 1.3 % IJ SUSP
INTRAMUSCULAR | Status: AC
  Filled 2023-09-22: qty 10

## 2023-09-22 MED ORDER — DEXAMETHASONE SODIUM PHOSPHATE 4 MG/ML IJ SOLN
INTRAMUSCULAR | Status: AC
Start: 2023-09-22 — End: ?
  Filled 2023-09-22: qty 1

## 2023-09-22 MED ORDER — METOCLOPRAMIDE HCL 5 MG PO TABS: 5.0000 mg | ORAL_TABLET | Freq: Three times a day (TID) | ORAL | Status: DC | PRN

## 2023-09-22 MED ORDER — BUPIVACAINE LIPOSOME 1.3 % IJ SUSP
INTRAMUSCULAR | Status: DC | PRN
  Administered 2023-09-22: 10 mL

## 2023-09-22 MED ORDER — LACTATED RINGERS IV SOLN
INTRAVENOUS | Status: DC
Start: 2023-09-22 — End: 2023-09-22

## 2023-09-22 MED ORDER — BUPIVACAINE-EPINEPHRINE (PF) 0.25% -1:200000 IJ SOLN
INTRAMUSCULAR | Status: DC | PRN
  Administered 2023-09-22: 10 mL

## 2023-09-22 MED ORDER — PROPOFOL 10 MG/ML IV BOLUS
INTRAVENOUS | Status: AC
  Filled 2023-09-22: qty 20

## 2023-09-22 MED ORDER — PROPOFOL 10 MG/ML IV BOLUS
INTRAVENOUS | Status: DC | PRN
  Administered 2023-09-22: 150 mg via INTRAVENOUS

## 2023-09-22 MED ORDER — OXYCODONE-ACETAMINOPHEN 5-325 MG PO TABS: 1.0000 | ORAL_TABLET | Freq: Four times a day (QID) | ORAL | 0 refills | Status: DC | PRN

## 2023-09-22 MED ORDER — ONDANSETRON HCL 4 MG/2ML IJ SOLN
INTRAMUSCULAR | Status: AC
  Filled 2023-09-22: qty 2

## 2023-09-22 MED ORDER — SODIUM CHLORIDE 0.9 % IV SOLN: INTRAVENOUS | Status: DC | PRN

## 2023-09-22 MED ORDER — ONDANSETRON HCL 4 MG/2ML IJ SOLN
INTRAMUSCULAR | Status: DC | PRN
  Administered 2023-09-22: 4 mg via INTRAVENOUS

## 2023-09-22 MED ORDER — SUCCINYLCHOLINE CHLORIDE 200 MG/10ML IV SOSY
PREFILLED_SYRINGE | INTRAVENOUS | Status: AC
Start: 2023-09-22 — End: ?
  Filled 2023-09-22: qty 10

## 2023-09-22 MED ORDER — DEXAMETHASONE SODIUM PHOSPHATE 4 MG/ML IJ SOLN
INTRAMUSCULAR | Status: DC | PRN
  Administered 2023-09-22: 8 mg via INTRAVENOUS

## 2023-09-22 SURGICAL SUPPLY — 32 items
ANTIFOG SOL W/FOAM PAD STRL (MISCELLANEOUS)
BNDG COHESIVE 4X5 TAN STRL LF (GAUZE/BANDAGES/DRESSINGS) IMPLANT
BNDG ESMARCH 4X12 STRL LF (GAUZE/BANDAGES/DRESSINGS) IMPLANT
BNDG GAUZE DERMACEA FLUFF 4 (GAUZE/BANDAGES/DRESSINGS) IMPLANT
BNDG STRETCH 4X75 STRL LF (GAUZE/BANDAGES/DRESSINGS) ×1 IMPLANT
BOOT STEPPER DURA MED (SOFTGOODS) IMPLANT
CANISTER SUCT 1200ML W/VALVE (MISCELLANEOUS) ×1 IMPLANT
COVER LIGHT HANDLE UNIVERSAL (MISCELLANEOUS) ×2 IMPLANT
CUFF TOURN SGL QUICK 18X4 (TOURNIQUET CUFF) IMPLANT
DURAPREP 26ML APPLICATOR (WOUND CARE) ×1 IMPLANT
ELECT REM PT RETURN 9FT ADLT (ELECTROSURGICAL) ×1
ELECTRODE REM PT RTRN 9FT ADLT (ELECTROSURGICAL) ×1 IMPLANT
GAUZE SPONGE 4X4 12PLY STRL (GAUZE/BANDAGES/DRESSINGS) IMPLANT
GAUZE XEROFORM 5X9 LF (GAUZE/BANDAGES/DRESSINGS) ×1 IMPLANT
GLOVE SURG SS PI 7.5 STRL IVOR (GLOVE) ×1 IMPLANT
GLOVE SURG UNDER LTX SZ8 (GLOVE) ×1 IMPLANT
GOWN STRL REUS W/ TWL LRG LVL3 (GOWN DISPOSABLE) ×2 IMPLANT
IV NS 250ML BAXH (IV SOLUTION) ×1 IMPLANT
KIT PRC PRB RTRGD 3ANG KNF HND (MISCELLANEOUS) ×1 IMPLANT
KIT TURNOVER KIT A (KITS) ×1 IMPLANT
NDL 18GX1X1/2 (RX/OR ONLY) (NEEDLE) IMPLANT
NDL HYPO 25GX1X1/2 BEV (NEEDLE) IMPLANT
NEEDLE 18GX1X1/2 (RX/OR ONLY) (NEEDLE) ×1 IMPLANT
NEEDLE HYPO 25GX1X1/2 BEV (NEEDLE) ×1 IMPLANT
NS IRRIG 500ML POUR BTL (IV SOLUTION) ×1 IMPLANT
PACK EXTREMITY ARMC (MISCELLANEOUS) ×1 IMPLANT
SOLUTION ANTFG W/FOAM PAD STRL (MISCELLANEOUS) IMPLANT
STOCKINETTE IMPERVIOUS LG (DRAPES) ×1 IMPLANT
SUT ETHILON 3-0 FS-10 30 BLK (SUTURE) ×1
SUTURE EHLN 3-0 FS-10 30 BLK (SUTURE) IMPLANT
SYR 10ML LL (SYRINGE) IMPLANT
WAND TOPAZ MICRO DEBRIDER (MISCELLANEOUS) IMPLANT

## 2023-09-22 NOTE — H&P (Signed)
 HISTORY AND PHYSICAL INTERVAL NOTE:  09/22/2023  1:39 PM  Tonya Boyd  has presented today for surgery, with the diagnosis of M72.2 - Plantar Fasciitis G57.52 - Tarsal tunnel syndrome of left side I63.89 - Cerebrovascular accident CVA due to other mechanism N18.9 - Chronic kidney disease unspecified CKD stage.  The various methods of treatment have been discussed with the patient.  No guarantees were given.  After consideration of risks, benefits and other options for treatment, the patient has consented to surgery.  I have reviewed the patients' chart and labs.     Boyd history and physical examination was performed in my office.  The patient was reexamined.  There have been no changes to this history and physical examination.  Tonya Boyd

## 2023-09-22 NOTE — Op Note (Signed)
 Operative note   Surgeon:Sanaa Zilberman [employer] forces logistics/support/administrative officer: None    Preop diagnosis: 1.  Plantar fasciitis left heel 2.  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  Endoscopic plantar fasciotomy left heel 2.  Distal tarsal tunnel release left medial heel    EBL: Minimal    Anesthesia: General With local.  Local consists of a total of 10 cc of 0.25% bupivacaine  and 10 cc of Exparel  long-acting anesthetic    Hemostasis: Mid calf tourniquet inflated to 200 mmHg for approximately 30 minutes    Specimen: None    Complications: None    Operative indications:Tonya Boyd is an 68 y.o. that presents today for surgical intervention.  The risks/benefits/alternatives/complications have been discussed and consent has been given.    Procedure:  Patient was brought into the OR and placed on the operating table in thesupine position. After anesthesia was obtained theleft lower extremity was prepped and draped in usual sterile fashion.  Attention was directed to the medial aspect the left heel where a small 1 cm incision was performed at the plantar fascia insertion.  Sharp and blunt dissection carried down to the fascial ligament.  Using the fascial elevator this was introduced and dissection was performed to the lateral aspect of the heel.  Next the blunt trocar and cannula was introduced and a small stab incision was made laterally.  The blunt trocar was removed.  The endoscope was then introduced and I was able to evaluate the plantar fascia.  The medial one half of the plantar fascia was mapped out and with a small diamond blade this was then incised.  The deep muscle belly was noted at this time.  The wound was flushed with copious amounts of irrigation.  The blunt trocar was reintroduced and the trocar and cannula were then removed.  Attention was directed to the medial aspect of the heel where the medial incision was performed more proximal.  Sharp and  blunt dissection carried down to the superficial fascia and this was then incised.  The muscle belly was noted.  This was retracted dorsal and plantar.  The deeper fascial layer was noted and incised.  At this time there was no to be a large venous structure and just deep to that the distal portion of the tibial nerve consistent with Baxters nerve was noted.  This was followed down into the medial arch and all fascia was released surrounding the area.  Palpation did not reveal any further structures deep.  The wound was then flushed.  The subcutaneous tissue was closed with a 3-0 Vicryl and the skin closed with a 3-0 nylon.  Finally a small grid like pattern was performed along the plantar medial heel and small percutaneous stabs were performed with a 0.062 K wire.  Next the Topaz wand was then introduced in the grid like pattern along the plantar fascial for micro debridement.  A bulky sterile dressing was then applied.  The patient was placed in a neutral position with an equalizer walker boot.    Patient tolerated the procedure and anesthesia well.  Was transported from the OR to the PACU with all vital signs stable and vascular status intact. To be discharged per routine protocol.  Will follow up in approximately 1 week in the outpatient clinic.

## 2023-09-22 NOTE — Transfer of Care (Signed)
 Immediate Anesthesia Transfer of Care Note  Patient: Tonya Boyd  Procedure(s) Performed: PLANTAR FASCIA RELEASE (Left: Foot) [address] BAXTER'S RELEASE (Left: Foot)  Patient Location: PACU  Anesthesia Type: General ETT  Level of Consciousness: awake, alert  and patient cooperative  Airway and Oxygen Therapy: Patient Spontanous Breathing and Patient connected to supplemental oxygen  Post-op Assessment: Post-op Vital signs reviewed, Patient's Cardiovascular Status Stable, Respiratory Function Stable, Patent Airway and No signs of Nausea or vomiting  Post-op Vital Signs: Reviewed and stable  Complications: No notable events documented.

## 2023-09-23 ENCOUNTER — Encounter: Payer: Self-pay | Admitting: Podiatry

## 2023-10-05 NOTE — Anesthesia Postprocedure Evaluation (Signed)
Anesthesia Post Note  Patient: Tonya Boyd  Procedure(s) Performed: PLANTAR FASCIA RELEASE (Left: Foot) [id number] BAXTER'S RELEASE (Left: Foot)  Patient location during evaluation: PACU Anesthesia Type: General Level of consciousness: awake and alert Pain management: pain level controlled Vital Signs Assessment: post-procedure vital signs reviewed and stable Respiratory status: spontaneous breathing, nonlabored ventilation, respiratory function stable and patient connected to nasal cannula oxygen Cardiovascular status: blood pressure returned to baseline and stable Postop Assessment: no apparent nausea or vomiting Anesthetic complications: no   No notable events documented.   Last Vitals:  Vitals:   09/22/23 1515 09/22/23 1524  BP: (!) 142/73 (!) 163/81  Pulse: 82 77  Resp: 16 15  Temp:    SpO2: 96% 96%    Last Pain:  Vitals:   09/23/23 0935  TempSrc:   PainSc: 0-No pain                 Tonya Boyd

## 2023-10-08 ENCOUNTER — Ambulatory Visit: Payer: Medicare Other | Admitting: Cardiology

## 2023-10-20 ENCOUNTER — Ambulatory Visit: Payer: Medicare Other | Admitting: Adult Health

## 2023-11-12 ENCOUNTER — Other Ambulatory Visit: Payer: Self-pay | Admitting: Cardiology

## 2023-11-12 DIAGNOSIS — I10 Essential (primary) hypertension: Secondary | ICD-10-CM

## 2023-11-18 ENCOUNTER — Other Ambulatory Visit: Payer: Self-pay | Admitting: Internal Medicine

## 2023-11-18 DIAGNOSIS — N6012 Diffuse cystic mastopathy of left breast: Secondary | ICD-10-CM

## 2023-11-25 DIAGNOSIS — Z96651 Presence of right artificial knee joint: Secondary | ICD-10-CM | POA: Diagnosis not present

## 2023-11-29 ENCOUNTER — Other Ambulatory Visit: Payer: Self-pay | Admitting: Student

## 2023-11-29 ENCOUNTER — Ambulatory Visit
Admission: RE | Admit: 2023-11-29 | Discharge: 2023-11-29 | Disposition: A | Discharge status: Home / Self Care | Source: Ambulatory Visit | Attending: Student | Admitting: Student

## 2023-11-29 DIAGNOSIS — M25562 Pain in left knee: Secondary | ICD-10-CM

## 2023-11-29 DIAGNOSIS — Z9181 History of falling: Secondary | ICD-10-CM | POA: Diagnosis not present

## 2023-11-29 DIAGNOSIS — R600 Localized edema: Secondary | ICD-10-CM | POA: Diagnosis not present

## 2023-11-29 DIAGNOSIS — M799 Soft tissue disorder, unspecified: Secondary | ICD-10-CM | POA: Diagnosis not present

## 2023-11-29 DIAGNOSIS — M25462 Effusion, left knee: Secondary | ICD-10-CM | POA: Diagnosis not present

## 2023-12-20 DIAGNOSIS — M1712 Unilateral primary osteoarthritis, left knee: Secondary | ICD-10-CM | POA: Diagnosis not present

## 2023-12-20 DIAGNOSIS — M25562 Pain in left knee: Secondary | ICD-10-CM | POA: Diagnosis not present

## 2023-12-20 DIAGNOSIS — Z9181 History of falling: Secondary | ICD-10-CM | POA: Diagnosis not present

## 2023-12-28 ENCOUNTER — Other Ambulatory Visit: Payer: Self-pay | Admitting: Cardiology

## 2023-12-30 ENCOUNTER — Other Ambulatory Visit: Payer: Self-pay | Admitting: Internal Medicine

## 2023-12-30 ENCOUNTER — Ambulatory Visit
Admission: RE | Admit: 2023-12-30 | Discharge: 2023-12-30 | Disposition: A | Discharge status: Home / Self Care | Source: Ambulatory Visit | Attending: Internal Medicine | Admitting: Internal Medicine

## 2023-12-30 ENCOUNTER — Ambulatory Visit: Admission: RE | Admit: 2023-12-30 | Source: Ambulatory Visit

## 2023-12-30 DIAGNOSIS — Z09 Encounter for follow-up examination after completed treatment for conditions other than malignant neoplasm: Secondary | ICD-10-CM | POA: Diagnosis not present

## 2023-12-30 DIAGNOSIS — N6011 Diffuse cystic mastopathy of right breast: Secondary | ICD-10-CM

## 2024-01-04 ENCOUNTER — Ambulatory Visit: Payer: Medicare Other | Admitting: Adult Health

## 2024-01-04 DIAGNOSIS — E538 Deficiency of other specified B group vitamins: Secondary | ICD-10-CM | POA: Diagnosis not present

## 2024-01-12 ENCOUNTER — Ambulatory Visit: Admitting: Adult Health

## 2024-01-12 ENCOUNTER — Encounter: Payer: Self-pay | Admitting: Adult Health

## 2024-01-12 VITALS — BP 111/89 | HR 78 | Ht 64.0 in | Wt 211.0 lb

## 2024-01-12 DIAGNOSIS — R2990 Unspecified symptoms and signs involving the nervous system: Secondary | ICD-10-CM | POA: Diagnosis not present

## 2024-01-12 DIAGNOSIS — G43009 Migraine without aura, not intractable, without status migrainosus: Secondary | ICD-10-CM | POA: Diagnosis not present

## 2024-01-12 DIAGNOSIS — R200 Anesthesia of skin: Secondary | ICD-10-CM

## 2024-01-12 DIAGNOSIS — R202 Paresthesia of skin: Secondary | ICD-10-CM

## 2024-01-12 DIAGNOSIS — R519 Headache, unspecified: Secondary | ICD-10-CM

## 2024-01-12 MED ORDER — DIVALPROEX SODIUM ER 500 MG PO TB24: 500.0000 mg | ORAL_TABLET | Freq: Every day | ORAL | 1 refills | Status: DC

## 2024-01-12 MED ORDER — DIVALPROEX SODIUM ER 250 MG PO TB24: 250.0000 mg | ORAL_TABLET | Freq: Every day | ORAL | 5 refills | Status: DC

## 2024-01-12 NOTE — Patient Instructions (Addendum)
 You will be called to schedule a CT scan due to worsening headaches associated with facial numbness  Increase Depakote  - take 250mg  AM and 500mg  nightly  Schedule follow up visit with your PCP for continued stroke risk factor management       Followup in the future with me in 9 months or call earlier if needed       Thank you for coming to see us  at [facility]. I hope we have been able to provide you high quality care today.  You may receive a patient satisfaction survey over the next few weeks. We would appreciate your feedback and comments so that we may continue to improve ourselves and the health of our patients.

## 2024-01-12 NOTE — Progress Notes (Signed)
 [facility] [address] [place]. [place] [address] [phone]       OFFICE FOLLOW UP NOTE  [name] Date of Birth:  07-06-1956 Medical Record Number:  [id number]   Primary neurologist: Dr. Janett Boyd Reason for Referral: Stroke like episode, headaches   Chief Complaint  Patient presents with   Cerebrovascular Accident    Rm 3 alone Pt is well, reports she is having daily temporal headaches. No new stroke concerns. Has had two falls since last visit.        HPI:   Update 01/12/2024 JM: Patient returns for follow-up visit.  Overall stable without new stroke/TIA symptoms.  She has been experiencing right sided parietal/temporal headaches over the past 2 months associated with right facial numbness (periorbital, maxillary, nasal), pressure behind eye and feels eye lid is "weak". She does have chronic history of migraines but has never experienced these symptoms previously with a headache.  Denies any changes in vision with headaches, denies jaw pain or flulike symptoms.  These headaches are not severe enough to take OTC pain relievers.  She has remained on Depakote  ER 500mg  daily without side effects.  She does report 2 falls since prior visit due to tripping over objects but denies injury or hitting head. She believes her falls occurred due to difficulty fully picking up right leg d/t residual weakness and reports right sided peripheral visual impairment since 12/2022 (although vision loss not reported or seen on exam at that time, presented with speech disturbance and transient right hemiparesis with nonorganic features s/p TNK without evidence of acute stroke on imaging).  She continues to have right sided numbness which is chronic.  She has remained on aspirin  and Repatha .  She routinely follows with PCP and cardiology for stroke risk factor management.  No further questions or concerns at this time.     History provided for reference purposes only Update  04/01/2023 Dr. Janett Boyd: She returns for follow-up after last visit with Tonya Boyd 10 months ago.  She was recently hospitalized in April 2024 for another strokelike episode.  She presented to [facility], ED on 12/26/2022 with sudden onset of right-sided weakness and speech difficulties.  She was evaluated by teleneurology and found to have NIH stroke scale of 12 and given IV TNK and transferred to [facility] for further workup.  CT head on admission was unremarkable with aspect score of 10.  CT angiogram of the head and neck showed no large vessel stenosis or occlusion.  MRI scan showed no acute infarct.  2D echo showed ejection fraction of 60 to 65% with mild concentric left ventricular hypertrophy.  LDL cholesterol was 50 mg percent.  Hemoglobin A1c was 5.2.  Patient showed gradual improvement and was transferred for rehabilitation to facility in [place].  She stayed there for 2 weeks and has had gradual improvement.  She states right upper extremity strength is improved almost completely though she still has some weakness in the right leg.  She still has persistent right-sided sensory loss which is longstanding.  She is now able to ambulate independently and has just finished home physical and Occupational Therapy.  She states her headaches are much better now but they had increased after the stroke and she had increased the Depakote  ER from 250 mg daily to 500 mg daily which she is tolerating well and seems to be working well for her.  She is living at home with her husband and is fully independent in all activities of daily  living.  She is tolerating aspirin  well without bruising or bleeding and she gets Repatha  injections for her cholesterol.   Update 06/30/2022 JM: Patient returns for yearly follow-up visit unaccompanied.  Overall stable from neurological standpoint without any new neurological symptoms.  Reports continued mild right-sided weakness and speech difficulty.  She remains on Social Security disability.   Remains on Depakote  ER 250 mg daily for headache prophylaxis, has not experienced any recent headaches.  Compliant on aspirin , Repatha  and Zetia .  Blood pressure well controlled. S/p R total knee arthroplasty back in September, recovering well and feels like her right leg is stronger now, closely being followed by orthopedics.  She has been enjoying going to the senior center twice weekly as well as more recently doing ceramic paintings and crocheting.  No new concerns at this time.  Update 06/30/2021 JM: Returns for overdue 76-month follow-up unaccompanied. She missed prior visit due to having COVID in August - recovered well. Overall stable from neurological standpoint without new neurological symptoms.  She reports continued occasional right arm weakness (difficulty gripping) and occasional pain. Recently obtained R AFO brace for right foot weakness which she reports has been ongoing since her stroke. Did have a right foot fracture 12/2020 but feels like she recovered well form this. Routinely followed by ortho for right knee pain. Continues to use cane at all times - denies any recent falls. She remains on social security disability. Remains on Depakote  ER 250 mg daily - denies side effects. She has only had 2-3 headaches since prior visit. Will improve after closing eyes and use of ice. Compliant on aspirin , Repatha  and Zetia  without side effects as well as plavix  for s/p proximal RCA and ostial PDA stenting 04/08/2021 by Dr. Berry Boyd.  Continues to be routinely followed by cardiology. Blood pressure today 150/90. Is not currently monitoring at home. Medications changes recently due to hypotension.  No further concerns at this time.  Update 11/04/2020 JM: Tonya Boyd returns for scheduled 51-month follow-up.  Doing well from a stroke standpoint without new or reoccurring stroke/TIA symptoms Residual right-sided weakness, speech impairment and cognitive impairment stable  Reports compliance on aspirin  81 mg daily  without any bruising Reports compliance on Repatha  and cardiology recently initiated Zetia  10 mg daily -denies side effects and plans on repeating lipid panel in April with PCP Blood pressure today 144/89  On Depakote  ER 250 mg daily for migraine prophylaxis She will occassionally have migraines which have slightly worsened over the past few months likely in setting of family stressors including the passing of her brother at the end of January after being told of a cancer diagnosis in December as well as putting her dog down.  She does have family support as well as close friends and very active in her church.  No new concerns at this time.  Update 05/07/2020 JM: Ms. Goldrick returns for stroke like episode follow-up unaccompanied Continues to experience right hemiparesis, speech impairment and cognitive deficit which have been stable She did have formal neurocognitive evaluation by Dr. 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  Reported changes to speech and language, reduced encoding and new learning, slowed processing speed, lateralized motor deficits and memory dysfunction are likely residual symptoms of prior infarct. Denies new stroke/TIA symptoms Continues on aspirin  81 mg daily and Repatha  for secondary stroke prevention without side effects Dr. Berry Boyd manages repatha  and did recently have lipid panel completed with LDL 64 Blood pressure today 140/85 -monitors at home which  has been stable Remains on Depakote  ER 250 mg daily for migraine prophylaxis.  She denies any recent migraine occurrence. Does have have occasional mild headaches but typically associated with motion Was having increased headaches likely due to elevated blood pressure and after medication adjustments by Dr. Berry Boyd, blood pressures improved and worsening headaches resolved No further concerns at this time  Virtual visit 01/01/2020 JM: Ms. Faustini is is being seen via virtual visit for  follow-up regarding prior strokelike episode in 06/2019.  Residual symptoms of right sided weakness, speech impairment with childlike quality and cognitive deficit which have been stable without worsening. Evaluated by Dr. Cheryll Corti on 12/26/2019 and plans on undergoing neurocognitive evaluation today.  Continues on Depakote  250 mg daily for ongoing migraine prophylaxis with benefit.  Previously on 500 mg dosage and denies any worsening with lowering dose.  Mild occasional headaches but denies migrainous headaches.  Continues on aspirin  81 mg daily and Repatha  for stroke prevention.  Continues to follow with Dr. Berry Boyd, cardiology, routinely.  No concerns at this time.  Update 08/15/2019: Ms. Turbyfill is a 68 year old female who is being seen today for follow-up accompanied by her husband.  She continues to have speech difficulty with hesitancy and childlike quality.  She also continues to complain of cognition and memory difficulties with getting confused easily as well as subjective right-sided weakness and numbness.  She continues to ambulate with a cane and does endorse a couple falls due to tripping on her right foot due to reported weakness.  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  Currently on short-term disability by PCP and plans on pursuing disability in the near future.  She has since completed therapies with reports of improvement of prior deficits.  After prior visit, underwent EEG which was normal. She was started on Depakote  500 mg daily at prior visit due to potential complicated migraine versus seizure activity which she continues on without difficulty.  She denies reoccurring headaches or worsening of symptoms.  She does endorse occasional depression or sadness which has been more present thinking of her daughter that passed away 5 years ago.  She also endorses increased stress/anxiety at times that can worsen her cognition and speech.   She continues on aspirin  81 mg daily without bleeding or bruising.  Previously prescribed Repatha  but due to joint pain, this has been discontinued and plans on following with cardiology for initiation of a different type of cholesterol lowering agent.    Continues to follow with cardiology regularly for HTN and HLD management.  Blood pressure today 125/82.  No further concerns at this time.    Initial visit 06/15/2019 Dr. Janett Boyd: Ms. Reichow is a 68 year old Caucasian lady seen today for initial office consultation visit.  She is accompanied by her husband.  History is obtained from them, review of electronic medical records and I personally reviewed imaging films in PACS.  She has past medical history of hypertension, hyperlipidemia, coronary artery disease, kidney stones, hiatal hernia, gastroesophageal reflux disease, chronic bronchitis.  She states she had episode in early September when she woke up she felt she could not think right and she had brain fog she was off balance numbness on the right side of the face and tongue as well as right hand she is went and saw her primary care physician who ordered an outpatient MRI scan of the brain Boyd on 05/17/2019 at [facility] which I personally reviewed showed no  acute abnormalities.  Mild changes of chronic small vessel disease only noted.  She was seen in the ER on 06/08/2019 with sudden onset of speech change and right-sided weakness.  She also had some chest pain in the morning and took some nitroglycerin .  She also had a headache.  She states she does not get many headaches.  CT scan of the head in the ER was unremarkable.  She had some subjective speech difficulties and right-sided weakness with giveaway weakness as per Dr. Lelon Putty exam.  MRI scan of the brain was obtained which showed no acute abnormality and only changes of small vessel disease.  MRI of the brain showed no significant large vessel stenosis..  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   Patient states symptoms have persisted since then.  She is able to speak now but has trouble speaking and speaks in with a childlike quality to her speech.  Still has subjective weakness and numbness on the right side but is able to walk without assistance.  She states she cannot think because she has brain fog and will not be able to return to work.  She feels better.  She denies any prior known history of strokes TIAs or seizures.  She does take aspirin  every day and is on medication for blood pressure and cholesterol for her coronary artery disease.  She denies prior history of migraines or headaches with neurological symptoms.  She denies any significant ongoing stress in her life.  Her daughter died 5 years ago but she states she is coping with that.  Patient feels she is unable to work because she gets confused and the brain cannot think right.  She has in fact not even been driving.      ROS:   14 system review of systems is positive for those listed in HPI, all other systems negative   PMH:  Past Medical History:  Diagnosis Date   Angina pectoris (HCC)    Anxiety    Asthma    CAD (coronary artery disease), native coronary artery 08/02/2018   a.) [facility] 08/02/2018: EF 45%, super-dominant large RCA, 80% p-mRCA, 50% oRPDA, 80% D1; mPA 13, mPCWP 8, PA sat 81%, Ao sat 99, CO 6.95, CI 3.65 --> 4.0 x 30 mm Orsiro DES x1 to [facility]; b.) [facility] 10/07/2018: 50-60% oD1 (FFR 0.96), diffuse HG stenosis prox seg very small (<1 mm) RI --> med mgmt; c.) [facility] 04/08/2021: 80% pRCA (4.0 x 16 mm Synergy XD DES) and 80% oPDA (2.5 x 12 mm Synergy XD DES)   Chronic bronchitis (HCC)    Chronic kidney disease    Complication of anesthesia    a.) postoperative nausea only (no vomiting); also "shakes"; per patient relieved w/ IV diphenhydramine    COPD (chronic obstructive pulmonary disease) (HCC)    Depression    Diastolic dysfunction 07/23/2018   a.) TTE 07/23/2018: EF 55-60%, GLS  -19.6%, G1DD; b.) TTE 11/01/2020: EF 55%, triv AR, G1DD   Family history of adverse reaction to anesthesia    "cousin stopped breathing; he was allergic to the anesthesia" (08/02/2018)   GERD (gastroesophageal reflux disease)    High cholesterol    History of gout    History of hiatal hernia    History of kidney stones    Hypertension 07/2018   Interstitial cystitis    Long term current use of antithrombotics/antiplatelets    a.) daily DAPT therapy (ASA + clopidogrel )   Migraines    NSVT (nonsustained  ventricular tachycardia) (HCC) 07/2018   a.) holter 07/13/2018: 6 beat run NSVT; maximum rate 133 bpm.   OA (osteoarthritis)    PONV (postoperative nausea and vomiting)    Raynaud's disease    Stroke-like episode 06/08/2019   a.) speech difficulty, right sided weakness/paraesthesias; unclear etiology ?? possibly conversion disorder. MRI brain x 2 negative for stroke.    Social History:  Social History   Socioeconomic History   Marital status: Married    Spouse name: Bambi Lever   Number of children: 3   Years of education: GED   Highest education level: Not on file  Occupational History    Employer: [employer]  Tobacco Use   Smoking status: Never   Smokeless tobacco: Never  Vaping Use   Vaping status: Never Used  Substance and Sexual Activity   Alcohol use: Not Currently    Comment: 08/02/2018 "not since my 20's"   Drug use: Never   Sexual activity: Not Currently  Other Topics Concern   Not on file  Social History Narrative   One child deceased   Social Drivers of Health   Financial Resource Strain: Low Risk  (09/28/2023)   Received from [facility]   Overall Financial Resource Strain (CARDIA)    Difficulty of Paying Living Expenses: Not hard at all  Food Insecurity: No Food Insecurity (09/28/2023)   Received from [facility]   Hunger Vital Sign    Worried About Running Out of Food in the Last Year: Never true     Ran Out of Food in the Last Year: Never true  Transportation Needs: No Transportation Needs (09/28/2023)   Received from [facility] - Transportation    In the past 12 months, has lack of transportation kept you from medical appointments or from getting medications?: No    Lack of Transportation (Non-Medical): No  Physical Activity: Not on file  Stress: Not on file  Social Connections: Unknown (01/18/2022)   Received from [facility]   Social Network    Social Network: Not on file  Intimate Partner Violence: Not At Risk (12/28/2022)   Humiliation, Afraid, Rape, and Kick questionnaire    Fear of Current or Ex-Partner: No    Emotionally Abused: No    Physically Abused: No    Sexually Abused: No    Medications:   Current Outpatient Medications on File Prior to Visit  Medication Sig Dispense Refill   acebutolol  (SECTRAL ) 200 MG capsule TAKE 1 CAPSULE BY MOUTH TWICE  DAILY 200 capsule 2   aspirin  81 MG chewable tablet Chew 1 tablet (81 mg total) by mouth daily.     cyanocobalamin  (,VITAMIN B-12,) 1000 MCG/ML injection Inject 1,000 mcg into the skin every 30 (thirty) days.     divalproex  (DEPAKOTE  ER) 500 MG 24 hr tablet Take 1 tablet (500 mg total) by mouth every morning. 90 tablet 1   Evolocumab  (REPATHA  SURECLICK) 140 MG/ML SOAJ INJECT 140 MG INTO THE SKIN EVERY 2 WEEKS. 2 mL 11   hydrOXYzine  (ATARAX ) 25 MG tablet Take 25 mg by mouth 3 (three) times daily as needed.     isosorbide  mononitrate (IMDUR ) 60 MG 24 hr tablet Take 1 tablet (60 mg total) by mouth daily. 90 tablet 3   loratadine  (CLARITIN ) 10 MG tablet Take 10 mg by mouth at bedtime.     losartan  (COZAAR ) 25 MG tablet Take 1 tablet by mouth in the evening 90 tablet 2  nitroGLYCERIN  (NITROSTAT ) 0.4 MG SL tablet DISSOLVE 1 TABLET SUBLINGUALLY AS NEEDED FOR CHEST PAIN, MAY REPEAT EVERY 5 MINUTES. AFTER 3 CALL 911. 25 tablet 0   ondansetron  (ZOFRAN ) 4 MG tablet Take 1 tablet (4 mg total) by  mouth every 6 (six) hours as needed for nausea. 30 tablet 0   pantoprazole  (PROTONIX ) 20 MG tablet TAKE 1 TABLET BY MOUTH DAILY 100 tablet 2   Pumpkin Seed-Soy Germ (AZO BLADDER CONTROL/GO-LESS PO) Take by mouth 2 (two) times daily.     SYMBICORT 80-4.5 MCG/ACT inhaler Inhale 2 puffs into the lungs daily as needed (Coughing).     tiZANidine  (ZANAFLEX ) 4 MG tablet Take 1 tablet (4 mg total) by mouth every 8 (eight) hours as needed for muscle spasms. 30 tablet 0   traZODone  (DESYREL ) 100 MG tablet Take 100 mg by mouth at bedtime.     Vitamin D , Ergocalciferol , (DRISDOL ) 1.25 MG (50000 UT) CAPS capsule Take 50,000 Units by mouth every Monday.     No current facility-administered medications on file prior to visit.    Allergies:   Allergies  Allergen Reactions   Adhesive [Tape] Other (See Comments)    SKIN WILL TEAR EASILY!!!!   Brilinta  [Ticagrelor ] Shortness Of Breath   Statins Other (See Comments)    myalgia   Carvedilol      GI upset   Diovan [Valsartan]     Tingling    Hydrochlorothiazide     Urinary frequency   Ibuprofen     Causes bp to go up   Keflex [Cephalexin]     confusion   Morphine And Codeine      Hyper, ineffective    Norvasc [Amlodipine Besylate]     Numbness and tingling    Nsaids     Esophagus became red and swollen (tolerates meloxicam)    Prednisone      Elevated BP   Reglan  [Metoclopramide ]     Confusion, altered mental status   Shrimp (Diagnostic) Swelling    Tongue swells   Singulair [Montelukast] Diarrhea   Temazepam Hives       Physical Exam Today's Vitals   01/12/24 1004  BP: 111/89  Pulse: 78  Weight: 211 lb (95.7 kg)  Height: 5\' 4"  (1.626 m)   Body mass index is 36.22 kg/m.  General: well developed, well nourished,  pleasant middle-aged Caucasian female, seated, in no evident distress  Neurologic Exam Mental Status: Awake and fully alert. Childlike quality of speech but no evidence of true dysarthria or aphasia.  Speech fluctuates  during visit.  Oriented to place and time. Recent memory impaired and remote memory intact. Attention span, concentration and fund of knowledge appropriate during visit. Mood and affect appropriate.  Cranial Nerves: Pupils equal, briskly reactive to light. Extraocular movements full without nystagmus. Visual fields decreased in all quadrants bilaterally although inconsistent testing on confrontation testing (unchanged/chronic per patient although not previously documented/seen on exam, subjectively decreased vision in right periphery). Hearing intact. Facial sensation intact. Face, tongue, palate moves normally and symmetrically.  Motor: Normal bulk and tone.  Normal strength left upper and lower extremity.  Unable to appreciate weakness on exam although difficulty fully assessing right upper and lower extremity due to poor effort and giveaway weakness Sensory.:  Diminished touch, pinprick, position and vibratory sensation on right hemibody including forehead with splitting of midline Coordination: Rapid alternating movements normal in all extremities. Finger-to-nose and heel-to-shin performed accurately bilaterally.  Gait and Station: Arises from chair without difficulty and without assistance. Stance is normal. Gait  demonstrates decreased stride length and step height of RLE without use of AD Reflexes: 1+ and symmetric. Toes downgoing.       ASSESSMENT/PLAN: 32 year patient with speech difficulty, right sided weakness and numbness from stroke like episode of unclear etiology possibly conversion disorder. MRI brain x 2 negative for stroke. Exam shows nonorganic features with subjective right-sided weakness, giveaway weakness, sensory impairment, abnormal speech and cognitive impairment. Vascular risk factors of chronic migraines, CAD s/p angioplasty to right RCA 07/2018 and s/p stent proximal right RCA and ostial PDA, HTN and HLD with statin intolerance.  Recurrent strokelike episode and April 2024 with  right-sided weakness and speech difficulties treated with IV TNK and MRI negative for acute abnormality.     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 -Subjective impaired right peripheral vision since 12/2022 but this has not been previously mentioned or seen on exam.  On confrontation testing today, decreased vision in all 4 quadrants bilaterally although testing inconsistent.  Would benefit from evaluation with ophthalmology and encouraged her to schedule follow-up visit with her established ophthalmologist.  She verbalized understanding -Continue aspirin  81 mg daily and Repatha  and zetia  for secondary stroke prevention managed/prescribed by PCP -Continue to follow PCP/cardiology for aggressive stroke risk factor management including HTN with BP goal<130/90 and HLD with LDL goal<70  Migraines Chronic headaches -reports increased frequency over the past 2 months associated with right facial numbness and eye weakness which she has never previously experienced. Suspect more due to migraine headache but as new features and increased frequency, will complete CTH -increase Depakote  dosage, start 250mg  AM and continue 500mg  nightly     Follow-up in 9 months or earlier if needed    CC:  Virgle Grime, MD    I spent 40 minutes of face-to-face and non-face-to-face time with patient.  This included previsit chart review including review of prior OV notes and prior hospitalization, lab review, study review, order entry, electronic health record documentation, patient education and discussion regarding above diagnoses and treatment plan and answered all other questions to patient's satisfaction  Johny Nap, [facility]  [facility] [address] [place], [place] [address]  Phone [phone] Fax [phone] Note: This document was prepared with digital dictation and possible smart phrase technology. Any transcriptional errors  that result from this process are unintentional.

## 2024-01-13 DIAGNOSIS — K649 Unspecified hemorrhoids: Secondary | ICD-10-CM | POA: Diagnosis not present

## 2024-01-13 DIAGNOSIS — N3281 Overactive bladder: Secondary | ICD-10-CM | POA: Diagnosis not present

## 2024-01-16 ENCOUNTER — Emergency Department (HOSPITAL_COMMUNITY)

## 2024-01-16 ENCOUNTER — Emergency Department (HOSPITAL_COMMUNITY)
Admission: EM | Admit: 2024-01-16 | Discharge: 2024-01-16 | Disposition: A | Discharge status: Home / Self Care | Attending: Emergency Medicine | Admitting: Emergency Medicine

## 2024-01-16 ENCOUNTER — Other Ambulatory Visit: Payer: Self-pay

## 2024-01-16 ENCOUNTER — Encounter (HOSPITAL_COMMUNITY): Payer: Self-pay | Admitting: *Deleted

## 2024-01-16 DIAGNOSIS — R2990 Unspecified symptoms and signs involving the nervous system: Secondary | ICD-10-CM

## 2024-01-16 DIAGNOSIS — F449 Dissociative and conversion disorder, unspecified: Secondary | ICD-10-CM | POA: Diagnosis not present

## 2024-01-16 DIAGNOSIS — I10 Essential (primary) hypertension: Secondary | ICD-10-CM | POA: Diagnosis not present

## 2024-01-16 DIAGNOSIS — R519 Headache, unspecified: Secondary | ICD-10-CM | POA: Diagnosis not present

## 2024-01-16 DIAGNOSIS — I129 Hypertensive chronic kidney disease with stage 1 through stage 4 chronic kidney disease, or unspecified chronic kidney disease: Secondary | ICD-10-CM | POA: Insufficient documentation

## 2024-01-16 DIAGNOSIS — I2510 Atherosclerotic heart disease of native coronary artery without angina pectoris: Secondary | ICD-10-CM | POA: Diagnosis not present

## 2024-01-16 DIAGNOSIS — R200 Anesthesia of skin: Secondary | ICD-10-CM | POA: Insufficient documentation

## 2024-01-16 DIAGNOSIS — Z7982 Long term (current) use of aspirin: Secondary | ICD-10-CM | POA: Diagnosis not present

## 2024-01-16 DIAGNOSIS — I6782 Cerebral ischemia: Secondary | ICD-10-CM | POA: Diagnosis not present

## 2024-01-16 DIAGNOSIS — Z79899 Other long term (current) drug therapy: Secondary | ICD-10-CM | POA: Insufficient documentation

## 2024-01-16 DIAGNOSIS — G319 Degenerative disease of nervous system, unspecified: Secondary | ICD-10-CM | POA: Diagnosis not present

## 2024-01-16 DIAGNOSIS — J4489 Other specified chronic obstructive pulmonary disease: Secondary | ICD-10-CM | POA: Insufficient documentation

## 2024-01-16 DIAGNOSIS — N189 Chronic kidney disease, unspecified: Secondary | ICD-10-CM | POA: Insufficient documentation

## 2024-01-16 DIAGNOSIS — R29818 Other symptoms and signs involving the nervous system: Secondary | ICD-10-CM | POA: Diagnosis not present

## 2024-01-16 LAB — CBC WITH DIFFERENTIAL/PLATELET
Abs Immature Granulocytes: 0.02 10*3/uL (ref 0.00–0.07)
Basophils Absolute: 0.1 10*3/uL (ref 0.0–0.1)
Basophils Relative: 1 %
Eosinophils Absolute: 0.1 10*3/uL (ref 0.0–0.5)
Eosinophils Relative: 1 %
HCT: 43.6 % (ref 36.0–46.0)
Hemoglobin: 14.6 g/dL (ref 12.0–15.0)
Immature Granulocytes: 0 %
Lymphocytes Relative: 21 %
Lymphs Abs: 1.8 10*3/uL (ref 0.7–4.0)
MCH: 33.1 pg (ref 26.0–34.0)
MCHC: 33.5 g/dL (ref 30.0–36.0)
MCV: 98.9 fL (ref 80.0–100.0)
Monocytes Absolute: 0.7 10*3/uL (ref 0.1–1.0)
Monocytes Relative: 9 %
Neutro Abs: 5.8 10*3/uL (ref 1.7–7.7)
Neutrophils Relative %: 68 %
Platelets: 211 10*3/uL (ref 150–400)
RBC: 4.41 MIL/uL (ref 3.87–5.11)
RDW: 12.3 % (ref 11.5–15.5)
WBC: 8.5 10*3/uL (ref 4.0–10.5)
nRBC: 0 % (ref 0.0–0.2)

## 2024-01-16 LAB — RAPID URINE DRUG SCREEN, HOSP PERFORMED
Amphetamines: NOT DETECTED
Barbiturates: NOT DETECTED
Benzodiazepines: NOT DETECTED
Cocaine: NOT DETECTED
Opiates: NOT DETECTED
Tetrahydrocannabinol: NOT DETECTED

## 2024-01-16 LAB — BASIC METABOLIC PANEL WITH GFR
Anion gap: 11 (ref 5–15)
BUN: 23 mg/dL (ref 8–23)
CO2: 26 mmol/L (ref 22–32)
Calcium: 9.3 mg/dL (ref 8.9–10.3)
Chloride: 99 mmol/L (ref 98–111)
Creatinine, Ser: 1.18 mg/dL — ABNORMAL HIGH (ref 0.44–1.00)
GFR, Estimated: 51 mL/min — ABNORMAL LOW (ref >60)
Glucose, Bld: 96 mg/dL (ref 70–99)
Potassium: 4.5 mmol/L (ref 3.5–5.1)
Sodium: 136 mmol/L (ref 135–145)

## 2024-01-16 LAB — CBG MONITORING, ED: Glucose-Capillary: 110 mg/dL — ABNORMAL HIGH (ref 70–99)

## 2024-01-16 NOTE — Consult Note (Signed)
 NEUROLOGY TELESTROKE CONSULT NOTE   Date of service: Jan 16, 2024 Patient Name: Tonya Boyd MRN:  [id number] DOB:  Jan 11, 1956 Chief Complaint:  Requesting Provider: Cheyenne Cotta, MD  Consult Participants: myself, patient, bedside RN, telestroke RN Location of the provider: [facility] Location of the patient: [facility]  This consult was provided via telemedicine with 2-way video and audio communication. The patient/family was informed that care would be provided in this way and agreed to receive care in this manner.   History of Present Illness   This is a 68 year old woman with past medical history significant for anxiety, CAD, CKD, COPD, migraine headaches who presents with subjective weakness and numbness on the right side as well as speech impairment.  The symptoms started approximately 12:30 PM.  Exam demonstrates stuttering speech, giveaway weakness on the right, atypical wiggling noticed on right finger-nose-finger without ataxia.  NIH stroke scale was 5 see documentation exam.  CT head showed no acute process.  TNK was not administered due low suspicion for stroke given nonorganic features on exam favor conversion disorder.  CTA was not performed due to exam not consistent with LVO.  LKW: 1230 Modified rankin score: 2-Slight disability-UNABLE to perform all activities but does not need assistance IV Thrombolysis: no 2/2 nonorganic features on exam and low suspicion for stroke  NIHSS components Score: Comment  1a Level of Conscious 0[x]  1[]  2[]  3[]      1b LOC Questions 0[]  1[x]  2[]       1c LOC Commands 0[x]  1[]  2[]       2 Best Gaze 0[x]  1[]  2[]       3 Visual 0[x]  1[]  2[]  3[]      4 Facial Palsy 0[x]  1[]  2[]  3[]      5a Motor Arm - left 0[x]  1[]  2[]  3[]  4[]  UN[]    5b Motor Arm - Right 0[]  1[x]  2[]  3[]  4[]  UN[]    6a Motor Leg - Left 0[x]  1[]  2[]  3[]  4[]  UN[]    6b Motor Leg - Right 0[]  1[x]  2[]  3[]  4[]  UN[]    7 Limb Ataxia 0[x]  1[]  2[]  3[]  UN[]     8 Sensory 0[]  1[x]  2[]  UN[]      9 Best  Language 0[x]  1[]  2[]  3[]      10 Dysarthria 0[]  1[x]  2[]  UN[]      11 Extinct. and Inattention 0[x]  1[]  2[]       TOTAL:       ROS  Comprehensive ROS performed and pertinent positives documented in HPI   Past History   Past Medical History:  Diagnosis Date   Angina pectoris (HCC)    Anxiety    Asthma    CAD (coronary artery disease), native coronary artery 08/02/2018   a.) [facility] 08/02/2018: EF 45%, super-dominant large RCA, 80% p-mRCA, 50% oRPDA, 80% D1; mPA 13, mPCWP 8, PA sat 81%, Ao sat 99, CO 6.95, CI 3.65 --> 4.0 x 30 mm Orsiro DES x1 to [facility]; b.) [facility] 10/07/2018: 50-60% oD1 (FFR 0.96), diffuse HG stenosis prox seg very small (<1 mm) RI --> med mgmt; c.) [facility] 04/08/2021: 80% pRCA (4.0 x 16 mm Synergy XD DES) and 80% oPDA (2.5 x 12 mm Synergy XD DES)   Chronic bronchitis (HCC)    Chronic kidney disease    Complication of anesthesia    a.) postoperative nausea only (no vomiting); also "shakes"; per patient relieved w/ IV diphenhydramine    COPD (chronic obstructive pulmonary disease) (HCC)    Depression    Diastolic dysfunction 07/23/2018   a.) TTE 07/23/2018: EF 55-60%, GLS -  19.6%, G1DD; b.) TTE 11/01/2020: EF 55%, triv AR, G1DD   Family history of adverse reaction to anesthesia    "cousin stopped breathing; he was allergic to the anesthesia" (08/02/2018)   GERD (gastroesophageal reflux disease)    High cholesterol    History of gout    History of hiatal hernia    History of kidney stones    Hypertension 07/2018   Interstitial cystitis    Long term current use of antithrombotics/antiplatelets    a.) daily DAPT therapy (ASA + clopidogrel )   Migraines    NSVT (nonsustained ventricular tachycardia) (HCC) 07/2018   a.) holter 07/13/2018: 6 beat run NSVT; maximum rate 133 bpm.   OA (osteoarthritis)    PONV (postoperative nausea and vomiting)    Raynaud's disease    Stroke (HCC)    Stroke-like episode 06/08/2019   a.) speech difficulty, right sided weakness/paraesthesias;  unclear etiology ?? possibly conversion disorder. MRI brain x 2 negative for stroke.    Past Surgical History:  Procedure Laterality Date   ABDOMINAL HYSTERECTOMY     ANTERIOR CERVICAL DECOMP/DISCECTOMY FUSION     APPENDECTOMY     AUGMENTATION MAMMAPLASTY Bilateral    BACK SURGERY     BREAST BIOPSY Right 06/28/2023   US  RT BREAST BX W LOC DEV 1ST LESION IMG BX SPEC US  GUIDE 06/28/2023 [facility]MOGRAPHY   CATARACT EXTRACTION, BILATERAL Bilateral    CORONARY ANGIOPLASTY WITH STENT PLACEMENT  08/02/2018   CORONARY PRESSURE/FFR STUDY N/A 10/07/2018   Procedure: INTRAVASCULAR PRESSURE WIRE/FFR STUDY;  Surgeon: Knox Perl, MD;  Location: [facility];  Service: Cardiovascular;  Laterality: N/A;   CORONARY STENT INTERVENTION N/A 08/02/2018   Procedure: CORONARY STENT INTERVENTION;  Surgeon: Knox Perl, MD;  Location: [facility];  Service: Cardiovascular;  Laterality: N/A;  RCA    CORONARY STENT INTERVENTION N/A 04/08/2021   Procedure: CORONARY STENT INTERVENTION;  Surgeon: Knox Perl, MD;  Location: [facility];  Service: Cardiovascular;  Laterality: N/A;  RCA and PDA   CYSTOSCOPY W/ STONE MANIPULATION     KNEE ARTHROSCOPY Right    LAPAROSCOPIC CHOLECYSTECTOMY     LEFT HEART CATH AND CORONARY ANGIOGRAPHY N/A 10/07/2018   Procedure: LEFT HEART CATH AND CORONARY ANGIOGRAPHY;  Surgeon: Knox Perl, MD;  Location: [facility];  Service: Cardiovascular;  Laterality: N/A;   LEFT HEART CATH AND CORONARY ANGIOGRAPHY N/A 04/08/2021   Procedure: LEFT HEART CATH AND CORONARY ANGIOGRAPHY;  Surgeon: Knox Perl, MD;  Location: [facility];  Service: Cardiovascular;  Laterality: N/A;   LEFT HEART CATH AND CORONARY ANGIOGRAPHY N/A 09/04/2022   Procedure: LEFT HEART CATH AND CORONARY ANGIOGRAPHY;  Surgeon: Knox Perl, MD;  Location: [facility];  Service: Cardiovascular;  Laterality: N/A;   PLANTAR FASCIA RELEASE Left 09/22/2023   Procedure: PLANTAR FASCIA RELEASE;   Surgeon: Anell Baptist, DPM;  Location: [facility];  Service: Orthopedics/Podiatry;  Laterality: Left;   REPAIR ANKLE LIGAMENT Left    "tied up ligament; had tore up qthing in my ankle"   REPLACEMENT TOTAL KNEE Right    RIGHT/LEFT HEART CATH AND CORONARY ANGIOGRAPHY N/A 08/02/2018   Procedure: RIGHT/LEFT HEART CATH AND CORONARY ANGIOGRAPHY;  Surgeon: Knox Perl, MD;  Location: [facility];  Service: Cardiovascular;  Laterality: N/A;   SHOULDER SURGERY Bilateral    TARSAL TUNNEL RELEASE Left 09/22/2023   Procedure: 16109 BAXTER'S RELEASE;  Surgeon: Anell Baptist, DPM;  Location: [facility];  Service: Orthopedics/Podiatry;  Laterality:  Left;   TOTAL HIP ARTHROPLASTY Left 11/24/2022   Procedure: TOTAL HIP ARTHROPLASTY;  Surgeon: Elner Hahn, MD;  Location: [facility];  Service: Orthopedics;  Laterality: Left;   TOTAL KNEE ARTHROPLASTY Right 05/12/2022   Procedure: TOTAL KNEE ARTHROPLASTY;  Surgeon: Elner Hahn, MD;  Location: [facility];  Service: Orthopedics;  Laterality: Right;   TUBAL LIGATION      Family History: Family History  Problem Relation Age of Onset   Kidney failure Mother    Diabetes Mother    Heart disease Father    Heart attack Father    Colon cancer Brother    Liver cancer Brother    Heart disease Other    Arthritis Other    Cancer Other    Asthma Other    Diabetes Other    Kidney disease Other     Social History  reports that she has never smoked. She has never used smokeless tobacco. She reports that she does not currently use alcohol. She reports that she does not use drugs.  Allergies  Allergen Reactions   Adhesive [Tape] Other (See Comments)    SKIN WILL TEAR EASILY!!!!   Brilinta  [Ticagrelor ] Shortness Of Breath   Statins Other (See Comments)    myalgia   Carvedilol      GI upset   Diovan [Valsartan]     Tingling    Hydrochlorothiazide     Urinary frequency   Ibuprofen     Causes bp to go up   Keflex [Cephalexin]      confusion   Morphine And Codeine      Hyper, ineffective    Norvasc [Amlodipine Besylate]     Numbness and tingling    Nsaids     Esophagus became red and swollen (tolerates meloxicam)    Prednisone      Elevated BP   Reglan  [Metoclopramide ]     Confusion, altered mental status   Shrimp (Diagnostic) Swelling    Tongue swells   Singulair [Montelukast] Diarrhea   Temazepam Hives    Medications  No current facility-administered medications for this encounter.  Current Outpatient Medications:    acebutolol  (SECTRAL ) 200 MG capsule, TAKE 1 CAPSULE BY MOUTH TWICE  DAILY, Disp: 200 capsule, Rfl: 2   aspirin  81 MG chewable tablet, Chew 1 tablet (81 mg total) by mouth daily., Disp: , Rfl:    cyanocobalamin  (,VITAMIN B-12,) 1000 MCG/ML injection, Inject 1,000 mcg into the skin every 30 (thirty) days., Disp: , Rfl:    divalproex  (DEPAKOTE  ER) 250 MG 24 hr tablet, Take 1 tablet (250 mg total) by mouth daily., Disp: 30 tablet, Rfl: 5   divalproex  (DEPAKOTE  ER) 500 MG 24 hr tablet, Take 1 tablet (500 mg total) by mouth at bedtime., Disp: 90 tablet, Rfl: 1   Evolocumab  (REPATHA  SURECLICK) 140 MG/ML SOAJ, INJECT 140 MG INTO THE SKIN EVERY 2 WEEKS., Disp: 2 mL, Rfl: 11   hydrOXYzine  (ATARAX ) 25 MG tablet, Take 25 mg by mouth 3 (three) times daily as needed., Disp: , Rfl:    isosorbide  mononitrate (IMDUR ) 60 MG 24 hr tablet, Take 1 tablet (60 mg total) by mouth daily., Disp: 90 tablet, Rfl: 3   loratadine  (CLARITIN ) 10 MG tablet, Take 10 mg by mouth at bedtime., Disp: , Rfl:    losartan  (COZAAR ) 25 MG tablet, Take 1 tablet by mouth in the evening, Disp: 90 tablet, Rfl: 2   nitroGLYCERIN  (NITROSTAT ) 0.4 MG SL tablet, DISSOLVE 1 TABLET SUBLINGUALLY AS NEEDED FOR CHEST PAIN, MAY REPEAT EVERY  5 MINUTES. AFTER 3 CALL 911., Disp: 25 tablet, Rfl: 0   ondansetron  (ZOFRAN ) 4 MG tablet, Take 1 tablet (4 mg total) by mouth every 6 (six) hours as needed for nausea., Disp: 30 tablet, Rfl: 0   pantoprazole  (PROTONIX )  20 MG tablet, TAKE 1 TABLET BY MOUTH DAILY, Disp: 100 tablet, Rfl: 2   Pumpkin Seed-Soy Germ (AZO BLADDER CONTROL/GO-LESS PO), Take by mouth 2 (two) times daily., Disp: , Rfl:    SYMBICORT 80-4.5 MCG/ACT inhaler, Inhale 2 puffs into the lungs daily as needed (Coughing)., Disp: , Rfl:    tiZANidine  (ZANAFLEX ) 4 MG tablet, Take 1 tablet (4 mg total) by mouth every 8 (eight) hours as needed for muscle spasms., Disp: 30 tablet, Rfl: 0   traZODone  (DESYREL ) 100 MG tablet, Take 100 mg by mouth at bedtime., Disp: , Rfl:    Vitamin D , Ergocalciferol , (DRISDOL ) 1.25 MG (50000 UT) CAPS capsule, Take 50,000 Units by mouth every Monday., Disp: , Rfl:   Vitals   Vitals:   01/16/24 1422 01/16/24 1423  BP:  (!) 171/89  Pulse:  76  Resp:  18  Temp:  97.8 F (36.6 C)  TempSrc:  Oral  SpO2:  100%  Weight: 95.7 kg   Height: 5\' 4"  (1.626 m)     Body mass index is 36.21 kg/m.  Physical Exam   Physical Exam Gen: A&O x2, NAD Resp: normal WOB CV: extremities appear well-perfused  Neurologic Examination   Neuro: *MS: alert, oriented x2 but not to month, follows multi-step commands.  *Speech: mildly dysarthric but primarily stuttering, no aphasia, able to name and repeat *CN: PERRL 3mm, EOMI, VFF by confrontation, sensation intact, smile symmetric, hearing intact to voice *Motor:   Normal bulk.  No tremor, rigidity or bradykinesia. No pronator drift. Drift but not to bed with prominent giveway weakness RUE and RLE, no drift LUE and LLE *Sensory: Sensory deficit RUE *Coordination:  FNF with wiggly fingers without frank ataxia or tremor on R, unremarkable on L *Reflexes:  UTA 2/2 tele-exam *Gait: deferred  Labs/Imaging/Neurodiagnostic studies   CBC: No results for input(s): "WBC", "NEUTROABS", "HGB", "HCT", "MCV", "PLT" in the last 168 hours. Basic Metabolic Panel:  Lab Results  Component Value Date   NA 139 12/26/2022   K 4.6 12/26/2022   CO2 27 12/26/2022   GLUCOSE 105 (H) 12/26/2022   BUN  26 (H) 12/26/2022   CREATININE 1.10 (H) 12/26/2022   CALCIUM  8.9 12/26/2022   GFRNONAA 57 (L) 12/26/2022   GFRAA >60 06/13/2019   Lipid Panel:  Lab Results  Component Value Date   LDLCALC 50 12/27/2022   HgbA1c:  Lab Results  Component Value Date   HGBA1C 5.2 12/26/2022   Urine Drug Screen:     Component Value Date/Time   LABOPIA NONE DETECTED 12/26/2022 1258   COCAINSCRNUR NONE DETECTED 12/26/2022 1258   LABBENZ NONE DETECTED 12/26/2022 1258   AMPHETMU NONE DETECTED 12/26/2022 1258   THCU NONE DETECTED 12/26/2022 1258   LABBARB NONE DETECTED 12/26/2022 1258    Alcohol Level     Component Value Date/Time   ETH <10 12/26/2022 1212   INR  Lab Results  Component Value Date   INR 0.9 12/26/2022   APTT  Lab Results  Component Value Date   APTT 34 12/26/2022   AED levels: No results found for: "PHENYTOIN", "ZONISAMIDE", "LAMOTRIGINE", "LEVETIRACETA"  CT Head without contrast(Personally reviewed): No acute process personal review  ASSESSMENT   This is a 68 year old woman with past medical history  significant for anxiety, CAD, CKD, COPD, migraine headaches who presents with subjective weakness and numbness on the right side as well as speech impairment.  The symptoms started approximately 12:30 PM.  Exam demonstrates stuttering speech, giveaway weakness on the right, atypical wiggling noticed on right finger-nose-finger without ataxia.  NIH stroke scale was 5 see documentation exam.  CT head showed no acute process.  TNK was not administered due low suspicion for stroke given nonorganic features on exam favor conversion disorder.  CTA was not performed due to exam not consistent with LVO.  She reports history of stroke in 2024 with residual R sided weakness but her MRI brain at that time showed no e/o stroke (acute or remote). Despite this her sx persisted and she was ultimately discharged to rehab where she stayed for another 2 weeks. She subsequently went on disability for  these symptoms and is followed as an outpatient by Johny Nap with neuro. she also has migraine headaches and is on Depakote  for these.  Of her symptoms that she reports is acute today only the right sided sensory deficit could be secondary to stroke.  The speech pattern is more stuttering than dysarthric, the weakness is giveaway.  The weight lameness on finger-nose-finger is not ataxia.  These are all nonorganic features on the exam and are more consistent with conversion disorder than actual cerebrovascular event.  Therefore recommend MRI brain without contrast in ED and if no acute process discharged home.  There is no indication to admit for stroke/TIA workup if the MRI is negative.  RECOMMENDATIONS   - MRI brain wo contrast - If no acute process OK to discharge home with outpatient f/u ______________________________________________________________________    Signed, Eleni Griffin, MD [facility]

## 2024-01-16 NOTE — ED Triage Notes (Signed)
 Pt with right facial numbness x 1 week with HA, pt seen neurologist, tongue numbness x 2 hours. Denies HA at present.  Speech changed x 15  minutes.  Weakness from previous stroke to right leg.

## 2024-01-16 NOTE — Progress Notes (Addendum)
 1413 arrival POV 1428 elert 1429 page to [facility] 1430 down to ct 1435 Dr Doretta Gant on screen  No tnk, Dr. Doretta Gant reviewed imaging for NCCT on screen. 1515 read given to Dr Doretta Gant by rad

## 2024-01-16 NOTE — ED Notes (Signed)
 Patient transported to MRI

## 2024-01-16 NOTE — ED Notes (Signed)
 Patient assisted to bedside commode.

## 2024-01-16 NOTE — ED Provider Notes (Signed)
 [facility] Provider Note   CSN: [id number] Arrival date & time: 01/16/24  1413     History  Chief Complaint  Patient presents with   Numbness    Tonya Boyd is a 68 y.o. female with a history significant for anxiety, CAD, migraine headaches, prior stroke like symptoms, most recently April 2024  presenting for evaluation of dysarthria which began approximately 15 minutes ago.  She had seen her neurologist 1 week ago with concern for right sided facial numbness for which an outpatient CT had been ordered but not yet completed.  Patient states that just prior to arrival she noted increasing numbness now including the right side of her tongue at which time she started having difficulty with speech and swallowing as she was just trying to eat a meal when her symptoms began.  She has had no treatment prior to arrival.  She does feel little weaker in her right hand than her baseline weakness.  She is unable to say when the right hand weakness worsened.  Review of chart pt had h/o R sided weakness for which she received TNK, but MRI showed no CVA. Continued to have weakness and was discharged to rehab for 2 weeks. She continues to take depakote  for chronic headache, denies h/o seizures.    The history is provided by the patient.       Home Medications Prior to Admission medications   Medication Sig Start Date End Date Taking? Authorizing Provider  acebutolol  (SECTRAL ) 200 MG capsule TAKE 1 CAPSULE BY MOUTH TWICE  DAILY 12/30/23   Knox Perl, MD  aspirin  81 MG chewable tablet Chew 1 tablet (81 mg total) by mouth daily. 12/31/22   Laymond Priestly, NP  cyanocobalamin  (,VITAMIN B-12,) 1000 MCG/ML injection Inject 1,000 mcg into the skin every 30 (thirty) days. 01/31/21   [provider]  divalproex  (DEPAKOTE  ER) 250 MG 24 hr tablet Take 1 tablet (250 mg total) by mouth daily. 01/12/24   Johny Nap, NP  divalproex  (DEPAKOTE  ER) 500 MG 24 hr  tablet Take 1 tablet (500 mg total) by mouth at bedtime. 01/12/24   Johny Nap, NP  Evolocumab  (REPATHA  SURECLICK) 140 MG/ML SOAJ INJECT 140 MG INTO THE SKIN EVERY 2 WEEKS. 06/28/23   Knox Perl, MD  hydrOXYzine  (ATARAX ) 25 MG tablet Take 25 mg by mouth 3 (three) times daily as needed.    [provider]  isosorbide  mononitrate (IMDUR ) 60 MG 24 hr tablet Take 1 tablet (60 mg total) by mouth daily. 06/10/23   Knox Perl, MD  loratadine  (CLARITIN ) 10 MG tablet Take 10 mg by mouth at bedtime.    [provider]  losartan  (COZAAR ) 25 MG tablet Take 1 tablet by mouth in the evening 11/12/23   Knox Perl, MD  nitroGLYCERIN  (NITROSTAT ) 0.4 MG SL tablet DISSOLVE 1 TABLET SUBLINGUALLY AS NEEDED FOR CHEST PAIN, MAY REPEAT EVERY 5 MINUTES. AFTER 3 CALL 911. 07/16/23   Knox Perl, MD  ondansetron  (ZOFRAN ) 4 MG tablet Take 1 tablet (4 mg total) by mouth every 6 (six) hours as needed for nausea. 11/25/22   Rojelio Clement, PA-C  pantoprazole  (PROTONIX ) 20 MG tablet TAKE 1 TABLET BY MOUTH DAILY 12/30/23   Knox Perl, MD  Pumpkin Seed-Soy Germ (AZO BLADDER CONTROL/GO-LESS PO) Take by mouth 2 (two) times daily.    [provider]  SYMBICORT 80-4.5 MCG/ACT inhaler Inhale 2 puffs into the lungs daily as needed (Coughing). 08/10/22   [provider]  tiZANidine  (ZANAFLEX ) 4 MG tablet Take 1 tablet (4 mg total) by mouth every 8 (eight) hours as needed for muscle spasms. 12/31/22   Laymond Priestly, NP  traZODone  (DESYREL ) 100 MG tablet Take 100 mg by mouth at bedtime.    [provider]  Vitamin D , Ergocalciferol , (DRISDOL ) 1.25 MG (50000 UT) CAPS capsule Take 50,000 Units by mouth every Monday.    [provider]      Allergies    Adhesive [tape], Brilinta  [ticagrelor ], Statins, Carvedilol , Diovan [valsartan], Hydrochlorothiazide, Ibuprofen, Keflex [cephalexin], Morphine and codeine , Norvasc [amlodipine besylate], Nsaids, Prednisone , Reglan  [metoclopramide ], Shrimp  (diagnostic), Singulair [montelukast], and Temazepam    Review of Systems   Review of Systems  Constitutional:  Negative for fever.  HENT:  Positive for trouble swallowing. Negative for congestion and sore throat.   Eyes: Negative.   Respiratory:  Negative for chest tightness and shortness of breath.   Cardiovascular:  Negative for chest pain.  Gastrointestinal:  Negative for abdominal pain and nausea.  Genitourinary: Negative.   Musculoskeletal:  Negative for arthralgias, joint swelling and neck pain.  Skin: Negative.  Negative for rash and wound.  Neurological:  Positive for facial asymmetry and weakness. Negative for dizziness, light-headedness, numbness and headaches.       Dysarthria  Psychiatric/Behavioral: Negative.      Physical Exam Updated Vital Signs BP (!) 149/70   Pulse 64   Temp 97.8 F (36.6 C) (Oral)   Resp 13   Ht 5\' 4"  (1.626 m)   Wt 95.7 kg   SpO2 98%   BMI 36.21 kg/m  Physical Exam Vitals and nursing note reviewed.  Constitutional:      Appearance: Normal appearance. She is well-developed.  HENT:     Head: Normocephalic and atraumatic.  Eyes:     Conjunctiva/sclera: Conjunctivae normal.  Cardiovascular:     Rate and Rhythm: Normal rate and regular rhythm.     Heart sounds: Normal heart sounds.  Pulmonary:     Effort: Pulmonary effort is normal.     Breath sounds: Normal breath sounds. No wheezing.  Abdominal:     General: Bowel sounds are normal.     Palpations: Abdomen is soft.     Tenderness: There is no abdominal tenderness.  Musculoskeletal:        General: Normal range of motion.     Cervical back: Normal range of motion.  Skin:    General: Skin is warm and dry.  Neurological:     Mental Status: She is alert.     Comments: 3/5 grip right,  5/5 left.  Subtle right facial droop,  tongue deviation to the left,  dysarthria present.     ED Results / Procedures / Treatments   Labs (all labs ordered are listed, but only abnormal results  are displayed) Labs Reviewed  BASIC METABOLIC PANEL WITH GFR - Abnormal; Notable for the following components:      Result Value   Creatinine, Ser 1.18 (*)    GFR, Estimated 51 (*)    All other components within normal limits  CBG MONITORING, ED - Abnormal; Notable for the following components:   Glucose-Capillary 110 (*)    All other components within normal limits  RAPID URINE DRUG SCREEN, HOSP PERFORMED  CBC WITH DIFFERENTIAL/PLATELET    EKG EKG Interpretation Date/Time:  Sunday Jan 16 2024 14:37:55 EDT Ventricular Rate:  83 PR Interval:  145 QRS Duration:  99 QT Interval:  399 QTC Calculation: 469 R  Axis:   -21  Text Interpretation: Sinus rhythm Borderline left axis deviation Low voltage, precordial leads Consider anterior infarct Confirmed by Annita Kindle [phone]) on 01/16/2024 4:14:26 PM  Radiology MR BRAIN WO CONTRAST Result Date: 01/16/2024 CLINICAL DATA:  Initial evaluation for acute neuro deficit, stroke suspected. EXAM: MRI HEAD WITHOUT CONTRAST TECHNIQUE: Multiplanar, multiecho pulse sequences of the brain and surrounding structures were obtained without intravenous contrast. COMPARISON:  CT from earlier the same day. FINDINGS: Brain: Mild age-related cerebral atrophy. Patchy T2/FLAIR hyperintensity involving the periventricular deep white matter both cerebral hemispheres, most likely related chronic microvascular ischemic disease, mild-to-moderate in nature. Possible small remote right cerebellar infarct noted. No evidence for acute or subacute ischemia. Gray-white matter differentiation maintained. No areas of chronic cortical infarction. No acute or chronic intracranial blood products. No mass lesion, midline shift or mass effect. No hydrocephalus or extra-axial fluid collection. Pituitary gland within normal limits. Vascular: Major intracranial vascular flow voids are maintained. Skull and upper cervical spine: Craniocervical junction within normal limits. Bone marrow  signal intensity normal. No scalp soft tissue abnormality. Sinuses/Orbits: Prior bilateral ocular lens replacement. Paranasal sinuses are clear. Trace right mastoid effusion, of doubtful significance. Other: None. IMPRESSION: 1. No acute intracranial abnormality. 2. Mild age-related cerebral atrophy with mild-to-moderate chronic microvascular ischemic disease. Electronically Signed   By: Virgia Griffins M.D.   On: 01/16/2024 18:02   CT HEAD CODE STROKE WO CONTRAST Result Date: 01/16/2024 CLINICAL DATA:  Code stroke. Neuro deficit, concern for stroke, right-sided facial numbness for 1 week with headache. EXAM: CT HEAD WITHOUT CONTRAST TECHNIQUE: Contiguous axial images were obtained from the base of the skull through the vertex without intravenous contrast. RADIATION DOSE REDUCTION: This exam was performed according to the departmental dose-optimization program which includes automated exposure control, adjustment of the mA and/or kV according to patient size and/or use of iterative reconstruction technique. COMPARISON:  CT head and CTA head and neck 12/26/2022. FINDINGS: Brain: No acute intracranial hemorrhage. No CT evidence of acute infarct. Nonspecific hypoattenuation in the periventricular and subcortical white matter favored to reflect chronic microvascular ischemic changes. No edema, mass effect, or midline shift. The basilar cisterns are patent. Ventricles: The ventricles are normal. Vascular: Atherosclerotic calcifications of the carotid siphons. No hyperdense vessel. Skull: No acute or aggressive finding. Orbits: Orbits are symmetric. Sinuses: The visualized paranasal sinuses are clear. Other: Mastoid air cells are clear. ASPECTS [facility] Stroke Program Early CT Score) - Ganglionic level infarction (caudate, lentiform nuclei, internal capsule, insula, M1-M3 cortex): 7 - Supraganglionic infarction (M4-M6 cortex): 3 Total score (0-10 with 10 being normal): 10 IMPRESSION: 1. No CT evidence of acute  intracranial abnormality. 2. ASPECTS is 10 These results were communicated to Dr. Doretta Gant At 3:15 pm on 01/16/2024 by text page via the [facility] messaging system. Electronically Signed   By: Denny Flack M.D.   On: 01/16/2024 15:15    Procedures Procedures    Medications Ordered in ED Medications - No data to display  ED Course/ Medical Decision Making/ A&P                                 Medical Decision Making Patient presenting with dysarthria and right facial numbness, the numbness has been present for a week, the dysarthria began just prior to arrival.  A code stroke was called and CT head confirmed no intracranial bleed.  She was promptly evaluated by Dr. Doretta Gant and her exam revealed inconsistent  findings, describing speech pattern as more of a stutter as opposed to true dysarthria, advised that patient can be disposed home if her MRI is negative which would confirm that her symptoms are of a nonorganic source.  After her workup was complete an MRI was also negative, conversation with patient and husband explaining findings, patient became a little agitated, it was noted that her speech pattern cleared during this interaction but it was noted she was speaking with childlike phrases although was clearly enunciating, for example phrases "I not talk you no more", and "this not migraine".  She was advised close follow-up with neurology, call in the morning for an office visit.  Amount and/or Complexity of Data Reviewed Labs: ordered.    Details: Labs reviewed and unremarkable, she does have a mild kidney injury with a creatinine of 1.18.  She is 1.101-year ago. Radiology: ordered.    Details: CT head and MRI brain without contrast both negative for bleed or ischemic stroke. Discussion of management or test interpretation with external provider(s): Code stoke initiated at which time Dr. Doretta Gant with neuro evaluated pt. 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  Also,  previous MRI never showed evidence for prior CVA.  Recommends MRI without contrast.  If negative, pt can be dispo's home.   Risk Decision regarding hospitalization. Risk Details: No indication for hospitalization.           Final Clinical Impression(s) / ED Diagnoses Final diagnoses:  Stroke-like symptoms    Rx / DC Orders ED Discharge Orders     None         Demitria Hay, PA-C 01/16/24 1859    Cheyenne Cotta, MD 01/17/24 1740

## 2024-01-16 NOTE — Discharge Instructions (Signed)
 Your work up today including your CT scan and your MRI confirms that you have not had a new stroke today, although you did have stroke like symptoms.  I recommend following up with your neurologist as needed.  Call in the morning for an office visit.

## 2024-01-16 NOTE — ED Notes (Signed)
 Julie Idol, PA in triage room to access pt.

## 2024-01-17 ENCOUNTER — Telehealth: Payer: Self-pay | Admitting: Adult Health

## 2024-01-17 NOTE — Telephone Encounter (Signed)
 Spoke w/Tonya Boyd regarding ED visit yesterday. Tonya Boyd stated today her speech is better but still having some difficulty. Tonya Boyd states weakness on Rt side is better today as well. Per ED notes was evaluated by neuro and had CT and MRI with no evidence of stroke and Tonya Boyd was D/C'd home. Tonya Boyd had OV with [name] 01/12/24 and Jessica increased divalproex  to 500 mg at bedtime. Tonya Boyd stated she has started the increased dose but has not had time to notice if it is helping. Informed Tonya Boyd will notify Dr. Janett Medin of recent issues and reach out to her if there are any recommendations. Tonya Boyd voiced understanding and thankful for call back.

## 2024-01-17 NOTE — Telephone Encounter (Signed)
 Pt called wanting to discuss her last ER that she had due to having face and tongue numbness on her R side and slurred speech. Please advise.

## 2024-01-18 NOTE — Telephone Encounter (Signed)
 Spoke w/Pt regarding Dr. Wynelle Heather response to Pt ED visit. Informed Pt provider recommends Pt continue current tx plan and medications and call our office if she has any changes. Pt voiced understanding and thankful for call back.

## 2024-01-20 DIAGNOSIS — I700 Atherosclerosis of aorta: Secondary | ICD-10-CM | POA: Diagnosis not present

## 2024-01-20 DIAGNOSIS — G720 Drug-induced myopathy: Secondary | ICD-10-CM | POA: Diagnosis not present

## 2024-01-20 DIAGNOSIS — E782 Mixed hyperlipidemia: Secondary | ICD-10-CM | POA: Diagnosis not present

## 2024-01-20 DIAGNOSIS — N1831 Chronic kidney disease, stage 3a: Secondary | ICD-10-CM | POA: Diagnosis not present

## 2024-01-20 DIAGNOSIS — J432 Centrilobular emphysema: Secondary | ICD-10-CM | POA: Diagnosis not present

## 2024-01-20 DIAGNOSIS — R202 Paresthesia of skin: Secondary | ICD-10-CM | POA: Diagnosis not present

## 2024-01-20 DIAGNOSIS — I25118 Atherosclerotic heart disease of native coronary artery with other forms of angina pectoris: Secondary | ICD-10-CM | POA: Diagnosis not present

## 2024-01-20 DIAGNOSIS — G8193 Hemiplegia, unspecified affecting right nondominant side: Secondary | ICD-10-CM | POA: Diagnosis not present

## 2024-01-20 DIAGNOSIS — I10 Essential (primary) hypertension: Secondary | ICD-10-CM | POA: Diagnosis not present

## 2024-01-20 DIAGNOSIS — R2990 Unspecified symptoms and signs involving the nervous system: Secondary | ICD-10-CM | POA: Diagnosis not present

## 2024-02-03 DIAGNOSIS — H47293 Other optic atrophy, bilateral: Secondary | ICD-10-CM | POA: Diagnosis not present

## 2024-02-03 DIAGNOSIS — H5213 Myopia, bilateral: Secondary | ICD-10-CM | POA: Diagnosis not present

## 2024-02-09 DIAGNOSIS — I25118 Atherosclerotic heart disease of native coronary artery with other forms of angina pectoris: Secondary | ICD-10-CM | POA: Diagnosis not present

## 2024-02-09 DIAGNOSIS — N1831 Chronic kidney disease, stage 3a: Secondary | ICD-10-CM | POA: Diagnosis not present

## 2024-02-09 DIAGNOSIS — J432 Centrilobular emphysema: Secondary | ICD-10-CM | POA: Diagnosis not present

## 2024-02-09 DIAGNOSIS — R7303 Prediabetes: Secondary | ICD-10-CM | POA: Diagnosis not present

## 2024-02-09 DIAGNOSIS — G8193 Hemiplegia, unspecified affecting right nondominant side: Secondary | ICD-10-CM | POA: Diagnosis not present

## 2024-02-16 DIAGNOSIS — I700 Atherosclerosis of aorta: Secondary | ICD-10-CM | POA: Diagnosis not present

## 2024-02-16 DIAGNOSIS — I10 Essential (primary) hypertension: Secondary | ICD-10-CM | POA: Diagnosis not present

## 2024-02-16 DIAGNOSIS — I25118 Atherosclerotic heart disease of native coronary artery with other forms of angina pectoris: Secondary | ICD-10-CM | POA: Diagnosis not present

## 2024-02-16 DIAGNOSIS — R202 Paresthesia of skin: Secondary | ICD-10-CM | POA: Diagnosis not present

## 2024-02-16 DIAGNOSIS — G8193 Hemiplegia, unspecified affecting right nondominant side: Secondary | ICD-10-CM | POA: Diagnosis not present

## 2024-02-16 DIAGNOSIS — G720 Drug-induced myopathy: Secondary | ICD-10-CM | POA: Diagnosis not present

## 2024-02-16 DIAGNOSIS — R2990 Unspecified symptoms and signs involving the nervous system: Secondary | ICD-10-CM | POA: Diagnosis not present

## 2024-02-16 DIAGNOSIS — J432 Centrilobular emphysema: Secondary | ICD-10-CM | POA: Diagnosis not present

## 2024-02-16 DIAGNOSIS — N1831 Chronic kidney disease, stage 3a: Secondary | ICD-10-CM | POA: Diagnosis not present

## 2024-02-16 DIAGNOSIS — E782 Mixed hyperlipidemia: Secondary | ICD-10-CM | POA: Diagnosis not present

## 2024-02-22 ENCOUNTER — Emergency Department (HOSPITAL_COMMUNITY)
Admission: EM | Admit: 2024-02-22 | Discharge: 2024-02-22 | Disposition: A | Discharge status: Home / Self Care | Attending: Emergency Medicine | Admitting: Emergency Medicine

## 2024-02-22 ENCOUNTER — Emergency Department (HOSPITAL_COMMUNITY)

## 2024-02-22 ENCOUNTER — Encounter (HOSPITAL_COMMUNITY): Payer: Self-pay

## 2024-02-22 ENCOUNTER — Other Ambulatory Visit: Payer: Self-pay

## 2024-02-22 DIAGNOSIS — I509 Heart failure, unspecified: Secondary | ICD-10-CM | POA: Insufficient documentation

## 2024-02-22 DIAGNOSIS — Z7902 Long term (current) use of antithrombotics/antiplatelets: Secondary | ICD-10-CM | POA: Insufficient documentation

## 2024-02-22 DIAGNOSIS — Z79899 Other long term (current) drug therapy: Secondary | ICD-10-CM | POA: Insufficient documentation

## 2024-02-22 DIAGNOSIS — Z7982 Long term (current) use of aspirin: Secondary | ICD-10-CM | POA: Insufficient documentation

## 2024-02-22 DIAGNOSIS — I130 Hypertensive heart and chronic kidney disease with heart failure and stage 1 through stage 4 chronic kidney disease, or unspecified chronic kidney disease: Secondary | ICD-10-CM | POA: Diagnosis not present

## 2024-02-22 DIAGNOSIS — J4489 Other specified chronic obstructive pulmonary disease: Secondary | ICD-10-CM | POA: Insufficient documentation

## 2024-02-22 DIAGNOSIS — Z96642 Presence of left artificial hip joint: Secondary | ICD-10-CM | POA: Diagnosis not present

## 2024-02-22 DIAGNOSIS — W228XXA Striking against or struck by other objects, initial encounter: Secondary | ICD-10-CM | POA: Insufficient documentation

## 2024-02-22 DIAGNOSIS — S0990XA Unspecified injury of head, initial encounter: Secondary | ICD-10-CM

## 2024-02-22 DIAGNOSIS — I6782 Cerebral ischemia: Secondary | ICD-10-CM | POA: Diagnosis not present

## 2024-02-22 DIAGNOSIS — Z96651 Presence of right artificial knee joint: Secondary | ICD-10-CM | POA: Diagnosis not present

## 2024-02-22 DIAGNOSIS — Z7901 Long term (current) use of anticoagulants: Secondary | ICD-10-CM | POA: Insufficient documentation

## 2024-02-22 DIAGNOSIS — N189 Chronic kidney disease, unspecified: Secondary | ICD-10-CM | POA: Diagnosis not present

## 2024-02-22 DIAGNOSIS — I2510 Atherosclerotic heart disease of native coronary artery without angina pectoris: Secondary | ICD-10-CM | POA: Diagnosis not present

## 2024-02-22 DIAGNOSIS — S0101XA Laceration without foreign body of scalp, initial encounter: Secondary | ICD-10-CM | POA: Diagnosis not present

## 2024-02-22 LAB — CBC WITH DIFFERENTIAL/PLATELET
Abs Immature Granulocytes: 0.04 10*3/uL (ref 0.00–0.07)
Basophils Absolute: 0.1 10*3/uL (ref 0.0–0.1)
Basophils Relative: 1 %
Eosinophils Absolute: 0.2 10*3/uL (ref 0.0–0.5)
Eosinophils Relative: 2 %
HCT: 46.7 % — ABNORMAL HIGH (ref 36.0–46.0)
Hemoglobin: 14.9 g/dL (ref 12.0–15.0)
Immature Granulocytes: 1 %
Lymphocytes Relative: 21 %
Lymphs Abs: 1.8 10*3/uL (ref 0.7–4.0)
MCH: 31.5 pg (ref 26.0–34.0)
MCHC: 31.9 g/dL (ref 30.0–36.0)
MCV: 98.7 fL (ref 80.0–100.0)
Monocytes Absolute: 0.7 10*3/uL (ref 0.1–1.0)
Monocytes Relative: 8 %
Neutro Abs: 5.9 10*3/uL (ref 1.7–7.7)
Neutrophils Relative %: 67 %
Platelets: 233 10*3/uL (ref 150–400)
RBC: 4.73 MIL/uL (ref 3.87–5.11)
RDW: 12.8 % (ref 11.5–15.5)
WBC: 8.7 10*3/uL (ref 4.0–10.5)
nRBC: 0 % (ref 0.0–0.2)

## 2024-02-22 LAB — BASIC METABOLIC PANEL WITH GFR
Anion gap: 12 (ref 5–15)
BUN: 24 mg/dL — ABNORMAL HIGH (ref 8–23)
CO2: 24 mmol/L (ref 22–32)
Calcium: 9.4 mg/dL (ref 8.9–10.3)
Chloride: 101 mmol/L (ref 98–111)
Creatinine, Ser: 1.02 mg/dL — ABNORMAL HIGH (ref 0.44–1.00)
GFR, Estimated: >60 mL/min (ref >60)
Glucose, Bld: 81 mg/dL (ref 70–99)
Potassium: 4.3 mmol/L (ref 3.5–5.1)
Sodium: 137 mmol/L (ref 135–145)

## 2024-02-22 MED ORDER — PROCHLORPERAZINE EDISYLATE 10 MG/2ML IJ SOLN
5.0000 mg | Freq: Once | INTRAMUSCULAR | Status: AC
  Administered 2024-02-22: 5 mg via INTRAVENOUS
  Filled 2024-02-22: qty 2

## 2024-02-22 MED ORDER — DIPHENHYDRAMINE HCL 50 MG/ML IJ SOLN
25.0000 mg | Freq: Once | INTRAMUSCULAR | Status: AC
  Administered 2024-02-22: 25 mg via INTRAVENOUS
  Filled 2024-02-22: qty 1

## 2024-02-22 MED ORDER — DIPHENHYDRAMINE HCL 50 MG/ML IJ SOLN
25.0000 mg | Freq: Once | INTRAMUSCULAR | Status: AC
  Administered 2024-02-22: 25 mg via INTRAMUSCULAR
  Filled 2024-02-22: qty 1

## 2024-02-22 MED ORDER — LACTATED RINGERS IV BOLUS
1000.0000 mL | Freq: Once | INTRAVENOUS | Status: AC
  Administered 2024-02-22: 1000 mL via INTRAVENOUS

## 2024-02-22 MED ORDER — PROCHLORPERAZINE EDISYLATE 10 MG/2ML IJ SOLN
10.0000 mg | Freq: Once | INTRAMUSCULAR | Status: AC
  Administered 2024-02-22: 10 mg via INTRAMUSCULAR
  Filled 2024-02-22: qty 2

## 2024-02-22 NOTE — ED Triage Notes (Signed)
 Pt arrived via POV c/o laceration to posterior scalp. Pt reports she was bent over to put her shoes on at the [facility] and hit the back of her head on the metal locker door. Pt reports she cannot have TDAP vaccine due to previous reaction. Pt reports taking Xarelto. Pt denies LOC.

## 2024-02-22 NOTE — ED Provider Notes (Signed)
 [place] EMERGENCY DEPARTMENT AT [facility] Provider Note  CSN: [id number] Arrival date & time: 02/22/24 1325  Chief Complaint(s) Laceration  HPI Tonya Boyd is a 68 y.o. female with PMH anxiety, asthma, COPD, previous CVA on Eliquis  who presents emergency room for evaluation of head injury.  States she was at the [facility] and stood up striking the back of her head on the corner of a locker.  No associated loss of consciousness.  Arrives with a small scalp laceration and bleeding largely controlled.  Denies numbness, tingling, weakness or other neurologic complaints.  Endorses persistent headache.   Past Medical History Past Medical History:  Diagnosis Date   Angina pectoris (HCC)    Anxiety    Asthma    CAD (coronary artery disease), native coronary artery 08/02/2018   a.) [facility] 08/02/2018: EF 45%, super-dominant large RCA, 80% p-mRCA, 50% oRPDA, 80% D1; mPA 13, mPCWP 8, PA sat 81%, Ao sat 99, CO 6.95, CI 3.65 --> 4.0 x 30 mm Orsiro DES x1 to [facility]; b.) LHC 10/07/2018: 50-60% oD1 (FFR 0.96), diffuse HG stenosis prox seg very small (<1 mm) RI --> med mgmt; c.) LHC 04/08/2021: 80% pRCA (4.0 x 16 mm Synergy XD DES) and 80% oPDA (2.5 x 12 mm Synergy XD DES)   Chronic bronchitis (HCC)    Chronic kidney disease    Complication of anesthesia    a.) postoperative nausea only (no vomiting); also shakes; per patient relieved w/ IV diphenhydramine    COPD (chronic obstructive pulmonary disease) (HCC)    Depression    Diastolic dysfunction 07/23/2018   a.) TTE 07/23/2018: EF 55-60%, GLS -19.6%, G1DD; b.) TTE 11/01/2020: EF 55%, triv AR, G1DD   Family history of adverse reaction to anesthesia    cousin stopped breathing; he was allergic to the anesthesia (08/02/2018)   GERD (gastroesophageal reflux disease)    High cholesterol    History of gout    History of hiatal hernia    History of kidney stones    Hypertension 07/2018   Interstitial cystitis    Long term current use of  antithrombotics/antiplatelets    a.) daily DAPT therapy (ASA + clopidogrel )   Migraines    NSVT (nonsustained ventricular tachycardia) (HCC) 07/2018   a.) holter 07/13/2018: 6 beat run NSVT; maximum rate 133 bpm.   OA (osteoarthritis)    PONV (postoperative nausea and vomiting)    Raynaud's disease    Stroke (HCC)    Stroke-like episode 06/08/2019   a.) speech difficulty, right sided weakness/paraesthesias; unclear etiology ?? possibly conversion disorder. MRI brain x 2 negative for stroke.   Patient Active Problem List   Diagnosis Date Noted   COPD (chronic obstructive pulmonary disease) (HCC) 12/30/2022   CHF (congestive heart failure) (HCC) 12/30/2022   CKD (chronic kidney disease) 12/30/2022   Insomnia 12/30/2022   Stroke-like symptoms 12/26/2022   Stroke (HCC) 12/26/2022   Status post total hip replacement, left 11/24/2022   Status post total knee replacement using cement, right 05/12/2022   Severe persistent asthma with status asthmaticus    Acute respiratory failure with hypoxia (HCC) 04/02/2022   Asthma, chronic, unspecified asthma severity, with acute exacerbation 04/02/2022   Complicated migraine 04/02/2022   GERD (gastroesophageal reflux disease) 04/02/2022   Hyperlipidemia 04/02/2022   Raynaud's disease without gangrene 10/15/2018   Angina pectoris (HCC) 10/07/2018   Essential hypertension 08/03/2018   CAD (coronary artery disease), native coronary artery 08/03/2018   Post PTCA 08/02/2018   Elevated IgE level 07/19/2018  Dyspnea on exertion 07/19/2018   Long-term exposure involving bird droppings 07/19/2018   Iliotibial band syndrome of left side 10/21/2011   Home Medication(s) Prior to Admission medications   Medication Sig Start Date End Date Taking? Authorizing Provider  acebutolol  (SECTRAL ) 200 MG capsule TAKE 1 CAPSULE BY MOUTH TWICE  DAILY 12/30/23   Knox Perl, MD  aspirin  81 MG chewable tablet Chew 1 tablet (81 mg total) by mouth daily. 12/31/22   Laymond Priestly, NP  cyanocobalamin  (,VITAMIN B-12,) 1000 MCG/ML injection Inject 1,000 mcg into the skin every 30 (thirty) days. 01/31/21   [provider]  divalproex  (DEPAKOTE  ER) 250 MG 24 hr tablet Take 1 tablet (250 mg total) by mouth daily. 01/12/24   Johny Nap, NP  divalproex  (DEPAKOTE  ER) 500 MG 24 hr tablet Take 1 tablet (500 mg total) by mouth at bedtime. 01/12/24   Johny Nap, NP  Evolocumab  (REPATHA  SURECLICK) 140 MG/ML SOAJ INJECT 140 MG INTO THE SKIN EVERY 2 WEEKS. 06/28/23   Knox Perl, MD  hydrOXYzine  (ATARAX ) 25 MG tablet Take 25 mg by mouth 3 (three) times daily as needed.    [provider]  isosorbide  mononitrate (IMDUR ) 60 MG 24 hr tablet Take 1 tablet (60 mg total) by mouth daily. 06/10/23   Knox Perl, MD  loratadine  (CLARITIN ) 10 MG tablet Take 10 mg by mouth at bedtime.    [provider]  losartan  (COZAAR ) 25 MG tablet Take 1 tablet by mouth in the evening 11/12/23   Knox Perl, MD  MYRBETRIQ 25 MG TB24 tablet Take 25 mg by mouth daily. 01/14/24   [provider]  nitroGLYCERIN  (NITROSTAT ) 0.4 MG SL tablet DISSOLVE 1 TABLET SUBLINGUALLY AS NEEDED FOR CHEST PAIN, MAY REPEAT EVERY 5 MINUTES. AFTER 3 CALL 911. 07/16/23   Knox Perl, MD  ondansetron  (ZOFRAN ) 4 MG tablet Take 1 tablet (4 mg total) by mouth every 6 (six) hours as needed for nausea. 11/25/22   Rojelio Clement, PA-C  pantoprazole  (PROTONIX ) 20 MG tablet TAKE 1 TABLET BY MOUTH DAILY 12/30/23   Knox Perl, MD  Pumpkin Seed-Soy Germ (AZO BLADDER CONTROL/GO-LESS PO) Take by mouth 2 (two) times daily.    [provider]  SYMBICORT 80-4.5 MCG/ACT inhaler Inhale 2 puffs into the lungs daily as needed (Coughing). 08/10/22   [provider]  tiZANidine  (ZANAFLEX ) 4 MG tablet Take 1 tablet (4 mg total) by mouth every 8 (eight) hours as needed for muscle spasms. 12/31/22   Laymond Priestly, NP  traZODone  (DESYREL ) 100 MG tablet Take 100 mg by mouth at bedtime.    [provider]  Vitamin D , Ergocalciferol , (DRISDOL ) 1.25 MG (50000 UT) CAPS capsule Take 50,000 Units by mouth every Monday.    [provider]  XARELTO 2.5 MG TABS tablet Take 2.5 mg by mouth 2 (two) times daily. 01/20/24   [provider]  Past Surgical History Past Surgical History:  Procedure Laterality Date   ABDOMINAL HYSTERECTOMY     ANTERIOR CERVICAL DECOMP/DISCECTOMY FUSION     APPENDECTOMY     AUGMENTATION MAMMAPLASTY Bilateral    BACK SURGERY     BREAST BIOPSY Right 06/28/2023   US  RT BREAST BX W LOC DEV 1ST LESION IMG BX SPEC US  GUIDE 06/28/2023 [facility]MO[facility]   CATARACT EXTRACTION, BILATERAL Bilateral    CORONARY ANGIOPLASTY WITH STENT PLACEMENT  08/02/2018   CORONARY PRESSURE/FFR STUDY N/A 10/07/2018   Procedure: INTRAVASCULAR PRESSURE WIRE/FFR STUDY;  Surgeon: Knox Perl, MD;  Location: [facility];  Service: Cardiovascular;  Laterality: N/A;   CORONARY STENT INTERVENTION N/A 08/02/2018   Procedure: CORONARY STENT INTERVENTION;  Surgeon: Knox Perl, MD;  Location: [facility];  Service: Cardiovascular;  Laterality: N/A;  RCA    CORONARY STENT INTERVENTION N/A 04/08/2021   Procedure: CORONARY STENT INTERVENTION;  Surgeon: Knox Perl, MD;  Location: [facility];  Service: Cardiovascular;  Laterality: N/A;  RCA and PDA   CYSTOSCOPY W/ STONE MANIPULATION     KNEE ARTHROSCOPY Right    LAPAROSCOPIC CHOLECYSTECTOMY     LEFT HEART CATH AND CORONARY ANGIOGRAPHY N/A 10/07/2018   Procedure: LEFT HEART CATH AND CORONARY ANGIOGRAPHY;  Surgeon: Knox Perl, MD;  Location: [facility];  Service: Cardiovascular;  Laterality: N/A;   LEFT HEART CATH AND CORONARY ANGIOGRAPHY N/A 04/08/2021   Procedure: LEFT HEART CATH AND CORONARY ANGIOGRAPHY;  Surgeon: Knox Perl, MD;  Location: [facility];  Service:  Cardiovascular;  Laterality: N/A;   LEFT HEART CATH AND CORONARY ANGIOGRAPHY N/A 09/04/2022   Procedure: LEFT HEART CATH AND CORONARY ANGIOGRAPHY;  Surgeon: Knox Perl, MD;  Location: [facility];  Service: Cardiovascular;  Laterality: N/A;   PLANTAR FASCIA RELEASE Left 09/22/2023   Procedure: PLANTAR FASCIA RELEASE;  Surgeon: Anell Baptist, DPM;  Location: [facility];  Service: Orthopedics/Podiatry;  Laterality: Left;   REPAIR ANKLE LIGAMENT Left    tied up ligament; had tore up qthing in my ankle   REPLACEMENT TOTAL KNEE Right    RIGHT/LEFT HEART CATH AND CORONARY ANGIOGRAPHY N/A 08/02/2018   Procedure: RIGHT/LEFT HEART CATH AND CORONARY ANGIOGRAPHY;  Surgeon: Knox Perl, MD;  Location: [facility];  Service: Cardiovascular;  Laterality: N/A;   SHOULDER SURGERY Bilateral    TARSAL TUNNEL RELEASE Left 09/22/2023   Procedure: [id number] BAXTER'S RELEASE;  Surgeon: Anell Baptist, DPM;  Location: [facility];  Service: Orthopedics/Podiatry;  Laterality: Left;   TOTAL HIP ARTHROPLASTY Left 11/24/2022   Procedure: TOTAL HIP ARTHROPLASTY;  Surgeon: Elner Hahn, MD;  Location: [facility];  Service: Orthopedics;  Laterality: Left;   TOTAL KNEE ARTHROPLASTY Right 05/12/2022   Procedure: TOTAL KNEE ARTHROPLASTY;  Surgeon: Elner Hahn, MD;  Location: [facility];  Service: Orthopedics;  Laterality: Right;   TUBAL LIGATION     Family History Family History  Problem Relation Age of Onset   Kidney failure Mother    Diabetes Mother    Heart disease Father    Heart attack Father    Colon cancer Brother    Liver cancer Brother    Heart disease Other    Arthritis Other    Cancer Other    Asthma Other    Diabetes Other    Kidney disease Other     Social History Social History   Tobacco Use   Smoking status: Never    Passive exposure: Never  Smokeless tobacco: Never  Vaping Use   Vaping status: Never Used  Substance Use Topics   Alcohol use: Not Currently     Comment: 08/02/2018 not since my 20's   Drug use: Never   Allergies Adhesive [tape], Brilinta  [ticagrelor ], Statins, Azithromycin, Carvedilol , Diovan [valsartan], Hydrochlorothiazide, Ibuprofen, Keflex [cephalexin], Morphine and codeine , Norvasc [amlodipine besylate], Nsaids, Prednisone , Reglan  [metoclopramide ], Shrimp (diagnostic), Shrimp extract, Singulair [montelukast], and Temazepam  Review of Systems Review of Systems  Skin:  Positive for wound.    Physical Exam Vital Signs  I have reviewed the triage vital signs BP 110/84   Pulse 72   Temp (!) 97.2 F (36.2 C)   Resp 18   Ht 5' 4 (1.626 m)   Wt 95.7 kg   SpO2 98%   BMI 36.21 kg/m   Physical Exam Vitals and nursing note reviewed.  Constitutional:      General: She is not in acute distress.    Appearance: She is well-developed.  HENT:     Head: Normocephalic.     Comments: 1 cm scalp laceration  Eyes:     Conjunctiva/sclera: Conjunctivae normal.    Cardiovascular:     Rate and Rhythm: Normal rate and regular rhythm.     Heart sounds: No murmur heard. Pulmonary:     Effort: Pulmonary effort is normal. No respiratory distress.     Breath sounds: Normal breath sounds.  Abdominal:     Palpations: Abdomen is soft.     Tenderness: There is no abdominal tenderness.   Musculoskeletal:        General: No swelling.     Cervical back: Neck supple.   Skin:    General: Skin is warm and dry.     Capillary Refill: Capillary refill takes less than 2 seconds.   Neurological:     Mental Status: She is alert.   Psychiatric:        Mood and Affect: Mood normal.     ED Results and Treatments Labs (all labs ordered are listed, but only abnormal results are displayed) Labs Reviewed  CBC WITH DIFFERENTIAL/PLATELET - Abnormal; Notable for the following components:      Result Value   HCT 46.7 (*)    All other components within normal limits  BASIC METABOLIC PANEL WITH GFR - Abnormal; Notable for the following  components:   BUN 24 (*)    Creatinine, Ser 1.02 (*)    All other components within normal limits                                                                                                                          Radiology CT Head Wo Contrast Result Date: 02/22/2024 CLINICAL DATA:  Head trauma EXAM: CT HEAD WITHOUT CONTRAST TECHNIQUE: Contiguous axial images were obtained from the base of the skull through the vertex without intravenous contrast. RADIATION DOSE REDUCTION: This exam was performed according to the departmental dose-optimization program which includes automated exposure control, adjustment of  the mA and/or kV according to patient size and/or use of iterative reconstruction technique. COMPARISON:  MRI and CT 01/16/2024 FINDINGS: Brain: No acute territorial infarction, hemorrhage or intracranial mass. Mild atrophy and chronic small vessel ischemic changes of the white matter. Stable ventricle size. Vascular: No hyperdense vessels.  Carotid vascular calcification Skull: Normal. Negative for fracture or focal lesion. Sinuses/Orbits: No acute finding. Other: None IMPRESSION: 1. No CT evidence for acute intracranial abnormality. 2. Mild atrophy and chronic small vessel ischemic changes of the white matter. Electronically Signed   By: Esmeralda Hedge M.D.   On: 02/22/2024 15:41    Pertinent labs & imaging results that were available during my care of the patient were reviewed by me and considered in my medical decision making (see MDM for details).  Medications Ordered in ED Medications  prochlorperazine (COMPAZINE) injection 10 mg (10 mg Intramuscular Given 02/22/24 1458)  diphenhydrAMINE  (BENADRYL ) injection 25 mg (25 mg Intramuscular Given 02/22/24 1457)  lactated ringers  bolus 1,000 mL (0 mLs Intravenous Stopped 02/22/24 1742)  diphenhydrAMINE  (BENADRYL ) injection 25 mg (25 mg Intravenous Given 02/22/24 1629)  prochlorperazine (COMPAZINE) injection 5 mg (5 mg Intravenous Given 02/22/24  1629)                                                                                                                                     Procedures .Laceration Repair  Date/Time: 02/22/2024 6:38 PM  Performed by: Karlyn Overman, MD Authorized by: Karlyn Overman, MD   Anesthesia:    Anesthesia method:  Local infiltration   Local anesthetic:  Lidocaine  2% WITH epi Laceration details:    Location:  Scalp   Scalp location:  Occipital   Length (cm):  1 Skin repair:    Repair method:  Staples   Number of staples:  1 Approximation:    Approximation:  Close Repair type:    Repair type:  Simple Post-procedure details:    Dressing:  Open (no dressing)   (including critical care time)  Medical Decision Making / ED Course   This patient presents to the ED for concern of head injury, this involves an extensive number of treatment options, and is a complaint that carries with it a high risk of complications and morbidity.  The differential diagnosis includes scalp laceration, fracture, ICH, SDH, closed head injury, concussion  MDM: Patient seen emergency room for evaluation of head injury.  Physical exam with a 1 cm occipital scalp laceration but is otherwise unremarkable.  Laboratory evaluation unremarkable.  Scalp laceration repaired with 1 suture.  Patient reportedly had a reaction to the tetanus vaccine previously and thus this was not administered in the ER today.  Pending completion of CT imaging at time of signout as well as reevaluation of a.  Placey provider signout of continuation of workup.   Additional history obtained: -Additional history obtained from husband -External records from outside source obtained and reviewed including: Chart review  including previous notes, labs, imaging, consultation notes   Lab Tests: -I ordered, reviewed, and interpreted labs.   The pertinent results include:   Labs Reviewed  CBC WITH DIFFERENTIAL/PLATELET - Abnormal; Notable for the  following components:      Result Value   HCT 46.7 (*)    All other components within normal limits  BASIC METABOLIC PANEL WITH GFR - Abnormal; Notable for the following components:   BUN 24 (*)    Creatinine, Ser 1.02 (*)    All other components within normal limits      EKG she is awa  EKG Interpretation Date/Time:    Ventricular Rate:    PR Interval:    QRS Duration:    QT Interval:    QTC Calculation:   R Axis:      Text Interpretation:           Imaging Studies ordered: I ordered imaging studies including CT head and this is pending   Medicines ordered and prescription drug management: Meds ordered this encounter  Medications   prochlorperazine (COMPAZINE) injection 10 mg   diphenhydrAMINE  (BENADRYL ) injection 25 mg   lactated ringers  bolus 1,000 mL   diphenhydrAMINE  (BENADRYL ) injection 25 mg   prochlorperazine (COMPAZINE) injection 5 mg    -I have reviewed the patients home medicines and have made adjustments as needed  Critical interventions none    Cardiac Monitoring: The patient was maintained on a cardiac monitor.  I personally viewed and interpreted the cardiac monitored which showed an underlying rhythm of: NSR  Social Determinants of Health:  Factors impacting patients care include: none   Reevaluation: After the interventions noted above, I reevaluated the patient and found that they have :improved  Co morbidities that complicate the patient evaluation  Past Medical History:  Diagnosis Date   Angina pectoris (HCC)    Anxiety    Asthma    CAD (coronary artery disease), native coronary artery 08/02/2018   a.) [facility] 08/02/2018: EF 45%, super-dominant large RCA, 80% p-mRCA, 50% oRPDA, 80% D1; mPA 13, mPCWP 8, PA sat 81%, Ao sat 99, CO 6.95, CI 3.65 --> 4.0 x 30 mm Orsiro DES x1 to [facility]; b.) [facility] 10/07/2018: 50-60% oD1 (FFR 0.96), diffuse HG stenosis prox seg very small (<1 mm) RI --> med mgmt; c.) [facility] 04/08/2021: 80% pRCA (4.0 x 16 mm  Synergy XD DES) and 80% oPDA (2.5 x 12 mm Synergy XD DES)   Chronic bronchitis (HCC)    Chronic kidney disease    Complication of anesthesia    a.) postoperative nausea only (no vomiting); also shakes; per patient relieved w/ IV diphenhydramine    COPD (chronic obstructive pulmonary disease) (HCC)    Depression    Diastolic dysfunction 07/23/2018   a.) TTE 07/23/2018: EF 55-60%, GLS -19.6%, G1DD; b.) TTE 11/01/2020: EF 55%, triv AR, G1DD   Family history of adverse reaction to anesthesia    cousin stopped breathing; he was allergic to the anesthesia (08/02/2018)   GERD (gastroesophageal reflux disease)    High cholesterol    History of gout    History of hiatal hernia    History of kidney stones    Hypertension 07/2018   Interstitial cystitis    Long term current use of antithrombotics/antiplatelets    a.) daily DAPT therapy (ASA + clopidogrel )   Migraines    NSVT (nonsustained ventricular tachycardia) (HCC) 07/2018   a.) holter 07/13/2018: 6 beat run NSVT; maximum rate 133 bpm.   OA (osteoarthritis)  PONV (postoperative nausea and vomiting)    Raynaud's disease    Stroke [facility])    Stroke-like episode 06/08/2019   a.) speech difficulty, right sided weakness/paraesthesias; unclear etiology ?? possibly conversion disorder. MRI brain x 2 negative for stroke.      Dispostion: I considered admission for this patient, and disposition pending reevaluation by oncoming provider and completion of imaging studies.  Please see provider center note for continuation of workup.     Final Clinical Impression(s) / ED Diagnoses Final diagnoses:  Minor head injury, initial encounter     @PCDICTATION @    Karlyn Overman, MD 02/22/24 1840

## 2024-02-22 NOTE — Discharge Instructions (Signed)
 We evaluated you for your head injury.  Your testing in the emergency department was reassuring.  Please be sure to get lots of rest.  Please take 1000 mg Tylenol  every 6 hours as needed for headache.  Please have your staples removed in approximately 7 to 14 days.  Your primary doctor can remove your staples.  If you notice any drainage, pain or redness around your laceration, please come back for a wound check.  Please return if you develop any new or worsening symptoms such as uncontrolled headache, uncontrolled vomiting, lightheadedness or dizziness, recurrent falls, or any other concerning symptoms.

## 2024-02-22 NOTE — ED Notes (Signed)
 Wound care provided.

## 2024-02-22 NOTE — ED Notes (Signed)
 Pt roomed to 19, patient will be coming after CT. CT tech to bring them to the room.

## 2024-02-22 NOTE — ED Provider Triage Note (Signed)
 Emergency Medicine Provider Triage Evaluation Note  Tonya Boyd , a 68 y.o. female  was evaluated in triage.  Pt complains of head lacearation. The patient reports that she was getting changed in the locker room and got up and hit her head at the corner of the metal locker. She reports taht she feels a little lightheaded. Denies any vision changes from her baseline. Allergic to tetanus. On Xarelto.  Review of Systems  Positive:  Negative:   Physical Exam  There were no vitals taken for this visit. Gen:   Awake, no distress   Resp:  Normal effort  MSK:   Moves extremities without difficulty  Other:  Answering questions appropriately. Cranial nerves grossly intact.   Medical Decision Making  Medically screening exam initiated at 1:47 PM.  Appropriate orders placed.  Tonya Boyd was informed that the remainder of the evaluation will be completed by another provider, this initial triage assessment does not replace that evaluation, and the importance of remaining in the ED until their evaluation is complete.  CT head and labs ordered. Triage nurse aware to alert Level 2 give head injury on blood thinner.    Spence Dux, PA-C 02/22/24 1350

## 2024-02-22 NOTE — ED Provider Notes (Signed)
  ED Course / MDM   Clinical Course as of 02/22/24 1723  Tue Feb 22, 2024  1512 Received sign out from Dr. Jeannie Milo, pending CT head. Had fall and hit her head. Laceration repaired. [WS]  1723 Reassessed patient, CT head negative, she received some additional headache medication, reports currently she feels well and would like to go home.  Discussed suture care. Will discharge patient to home. All questions answered. Patient comfortable with plan of discharge. Return precautions discussed with patient and specified on the after visit summary.  [WS]    Clinical Course User Index [WS] Tomoki Lucken L, MD   Medical Decision Making Risk Prescription drug management.      Mordecai Applebaum, MD 02/22/24 1723

## 2024-02-28 DIAGNOSIS — M48062 Spinal stenosis, lumbar region with neurogenic claudication: Secondary | ICD-10-CM | POA: Diagnosis not present

## 2024-02-28 DIAGNOSIS — M5416 Radiculopathy, lumbar region: Secondary | ICD-10-CM | POA: Diagnosis not present

## 2024-03-02 DIAGNOSIS — M5416 Radiculopathy, lumbar region: Secondary | ICD-10-CM | POA: Diagnosis not present

## 2024-03-03 DIAGNOSIS — Z4802 Encounter for removal of sutures: Secondary | ICD-10-CM | POA: Diagnosis not present

## 2024-03-03 DIAGNOSIS — N3281 Overactive bladder: Secondary | ICD-10-CM | POA: Diagnosis not present

## 2024-04-05 DIAGNOSIS — G8929 Other chronic pain: Secondary | ICD-10-CM | POA: Diagnosis not present

## 2024-04-05 DIAGNOSIS — M1712 Unilateral primary osteoarthritis, left knee: Secondary | ICD-10-CM | POA: Diagnosis not present

## 2024-05-19 DIAGNOSIS — M25562 Pain in left knee: Secondary | ICD-10-CM | POA: Diagnosis not present

## 2024-05-19 DIAGNOSIS — M48062 Spinal stenosis, lumbar region with neurogenic claudication: Secondary | ICD-10-CM | POA: Diagnosis not present

## 2024-05-19 DIAGNOSIS — M1712 Unilateral primary osteoarthritis, left knee: Secondary | ICD-10-CM | POA: Diagnosis not present

## 2024-05-19 DIAGNOSIS — M5416 Radiculopathy, lumbar region: Secondary | ICD-10-CM | POA: Diagnosis not present

## 2024-05-19 DIAGNOSIS — G8929 Other chronic pain: Secondary | ICD-10-CM | POA: Diagnosis not present

## 2024-05-19 DIAGNOSIS — M7918 Myalgia, other site: Secondary | ICD-10-CM | POA: Diagnosis not present

## 2024-05-21 ENCOUNTER — Other Ambulatory Visit: Payer: Self-pay | Admitting: Cardiology

## 2024-05-21 DIAGNOSIS — I25118 Atherosclerotic heart disease of native coronary artery with other forms of angina pectoris: Secondary | ICD-10-CM

## 2024-06-07 DIAGNOSIS — M5416 Radiculopathy, lumbar region: Secondary | ICD-10-CM | POA: Diagnosis not present

## 2024-06-07 DIAGNOSIS — M48062 Spinal stenosis, lumbar region with neurogenic claudication: Secondary | ICD-10-CM | POA: Diagnosis not present

## 2024-06-21 ENCOUNTER — Other Ambulatory Visit: Payer: Self-pay | Admitting: Family Medicine

## 2024-06-21 DIAGNOSIS — M5416 Radiculopathy, lumbar region: Secondary | ICD-10-CM | POA: Diagnosis not present

## 2024-06-21 DIAGNOSIS — M25562 Pain in left knee: Secondary | ICD-10-CM | POA: Diagnosis not present

## 2024-06-21 DIAGNOSIS — M1712 Unilateral primary osteoarthritis, left knee: Secondary | ICD-10-CM | POA: Diagnosis not present

## 2024-06-21 DIAGNOSIS — G8929 Other chronic pain: Secondary | ICD-10-CM | POA: Diagnosis not present

## 2024-06-21 DIAGNOSIS — M1611 Unilateral primary osteoarthritis, right hip: Secondary | ICD-10-CM | POA: Diagnosis not present

## 2024-06-21 DIAGNOSIS — S32020A Wedge compression fracture of second lumbar vertebra, initial encounter for closed fracture: Secondary | ICD-10-CM | POA: Diagnosis not present

## 2024-06-23 ENCOUNTER — Ambulatory Visit (HOSPITAL_COMMUNITY)
Admission: RE | Admit: 2024-06-23 | Discharge: 2024-06-23 | Disposition: A | Discharge status: Home / Self Care | Source: Ambulatory Visit | Attending: Family Medicine | Admitting: Family Medicine

## 2024-06-23 ENCOUNTER — Other Ambulatory Visit: Payer: Self-pay | Admitting: Family Medicine

## 2024-06-23 ENCOUNTER — Other Ambulatory Visit

## 2024-06-23 DIAGNOSIS — M5117 Intervertebral disc disorders with radiculopathy, lumbosacral region: Secondary | ICD-10-CM | POA: Diagnosis not present

## 2024-06-23 DIAGNOSIS — M5416 Radiculopathy, lumbar region: Secondary | ICD-10-CM | POA: Insufficient documentation

## 2024-06-23 DIAGNOSIS — M5116 Intervertebral disc disorders with radiculopathy, lumbar region: Secondary | ICD-10-CM | POA: Diagnosis not present

## 2024-06-23 DIAGNOSIS — M4726 Other spondylosis with radiculopathy, lumbar region: Secondary | ICD-10-CM | POA: Diagnosis not present

## 2024-06-23 DIAGNOSIS — M4807 Spinal stenosis, lumbosacral region: Secondary | ICD-10-CM | POA: Diagnosis not present

## 2024-06-27 ENCOUNTER — Encounter: Payer: Self-pay | Admitting: Emergency Medicine

## 2024-06-27 ENCOUNTER — Emergency Department
Admission: EM | Admit: 2024-06-27 | Discharge: 2024-06-27 | Disposition: A | Discharge status: Home / Self Care | Attending: Emergency Medicine | Admitting: Emergency Medicine

## 2024-06-27 ENCOUNTER — Emergency Department

## 2024-06-27 ENCOUNTER — Other Ambulatory Visit: Payer: Self-pay

## 2024-06-27 DIAGNOSIS — J45909 Unspecified asthma, uncomplicated: Secondary | ICD-10-CM | POA: Diagnosis not present

## 2024-06-27 DIAGNOSIS — N189 Chronic kidney disease, unspecified: Secondary | ICD-10-CM | POA: Diagnosis not present

## 2024-06-27 DIAGNOSIS — Z96642 Presence of left artificial hip joint: Secondary | ICD-10-CM | POA: Insufficient documentation

## 2024-06-27 DIAGNOSIS — W109XXA Fall (on) (from) unspecified stairs and steps, initial encounter: Secondary | ICD-10-CM | POA: Diagnosis not present

## 2024-06-27 DIAGNOSIS — G8929 Other chronic pain: Secondary | ICD-10-CM

## 2024-06-27 DIAGNOSIS — I129 Hypertensive chronic kidney disease with stage 1 through stage 4 chronic kidney disease, or unspecified chronic kidney disease: Secondary | ICD-10-CM | POA: Insufficient documentation

## 2024-06-27 DIAGNOSIS — M5416 Radiculopathy, lumbar region: Secondary | ICD-10-CM | POA: Diagnosis not present

## 2024-06-27 DIAGNOSIS — M48062 Spinal stenosis, lumbar region with neurogenic claudication: Secondary | ICD-10-CM

## 2024-06-27 DIAGNOSIS — M1611 Unilateral primary osteoarthritis, right hip: Secondary | ICD-10-CM | POA: Diagnosis not present

## 2024-06-27 DIAGNOSIS — M1712 Unilateral primary osteoarthritis, left knee: Secondary | ICD-10-CM | POA: Diagnosis not present

## 2024-06-27 DIAGNOSIS — M5441 Lumbago with sciatica, right side: Secondary | ICD-10-CM | POA: Insufficient documentation

## 2024-06-27 DIAGNOSIS — W19XXXA Unspecified fall, initial encounter: Secondary | ICD-10-CM

## 2024-06-27 DIAGNOSIS — Z96651 Presence of right artificial knee joint: Secondary | ICD-10-CM | POA: Insufficient documentation

## 2024-06-27 DIAGNOSIS — I2510 Atherosclerotic heart disease of native coronary artery without angina pectoris: Secondary | ICD-10-CM | POA: Insufficient documentation

## 2024-06-27 DIAGNOSIS — R531 Weakness: Secondary | ICD-10-CM | POA: Diagnosis not present

## 2024-06-27 DIAGNOSIS — Z87442 Personal history of urinary calculi: Secondary | ICD-10-CM | POA: Diagnosis not present

## 2024-06-27 DIAGNOSIS — M545 Low back pain, unspecified: Secondary | ICD-10-CM | POA: Diagnosis present

## 2024-06-27 DIAGNOSIS — Z8673 Personal history of transient ischemic attack (TIA), and cerebral infarction without residual deficits: Secondary | ICD-10-CM | POA: Diagnosis not present

## 2024-06-27 DIAGNOSIS — R29898 Other symptoms and signs involving the musculoskeletal system: Secondary | ICD-10-CM | POA: Diagnosis not present

## 2024-06-27 LAB — URINALYSIS, ROUTINE W REFLEX MICROSCOPIC
Bilirubin Urine: NEGATIVE
Glucose, UA: NEGATIVE mg/dL
Ketones, ur: 5 mg/dL — AB
Nitrite: NEGATIVE
Protein, ur: NEGATIVE mg/dL
Specific Gravity, Urine: 1.03 (ref 1.005–1.030)
WBC, UA: >50 WBC/hpf (ref 0–5)
pH: 5 (ref 5.0–8.0)

## 2024-06-27 LAB — CBC
HCT: 42 % (ref 36.0–46.0)
Hemoglobin: 14 g/dL (ref 12.0–15.0)
MCH: 32.3 pg (ref 26.0–34.0)
MCHC: 33.3 g/dL (ref 30.0–36.0)
MCV: 96.8 fL (ref 80.0–100.0)
Platelets: 276 K/uL (ref 150–400)
RBC: 4.34 MIL/uL (ref 3.87–5.11)
RDW: 12.5 % (ref 11.5–15.5)
WBC: 9.6 K/uL (ref 4.0–10.5)
nRBC: 0 % (ref 0.0–0.2)

## 2024-06-27 LAB — COMPREHENSIVE METABOLIC PANEL WITH GFR
ALT: 14 U/L (ref 0–44)
AST: 14 U/L — ABNORMAL LOW (ref 15–41)
Albumin: 3.6 g/dL (ref 3.5–5.0)
Alkaline Phosphatase: 51 U/L (ref 38–126)
Anion gap: 12 (ref 5–15)
BUN: 27 mg/dL — ABNORMAL HIGH (ref 8–23)
CO2: 29 mmol/L (ref 22–32)
Calcium: 9.1 mg/dL (ref 8.9–10.3)
Chloride: 98 mmol/L (ref 98–111)
Creatinine, Ser: 1.13 mg/dL — ABNORMAL HIGH (ref 0.44–1.00)
GFR, Estimated: 53 mL/min — ABNORMAL LOW (ref >60)
Glucose, Bld: 87 mg/dL (ref 70–99)
Potassium: 3.9 mmol/L (ref 3.5–5.1)
Sodium: 139 mmol/L (ref 135–145)
Total Bilirubin: 0.5 mg/dL (ref 0.0–1.2)
Total Protein: 7.3 g/dL (ref 6.5–8.1)

## 2024-06-27 NOTE — ED Triage Notes (Signed)
 First nurse note: pcp sent pt to ED due to concerns about recent MRI finding of worsening spinal narrowing and weakness. Pt had informed the MD that she feels like she is not going to be able to control her bowels.

## 2024-06-27 NOTE — ED Provider Notes (Signed)
 [facility] Provider Note    Event Date/Time   First MD Initiated Contact with Patient 06/27/24 1726     (approximate)   History   Weakness   HPI  Tonya Boyd is a 68 y.o. female who presents to the ED for evaluation of Weakness   Patient presents with progressive right leg weakness over the past 1 week with changes to her gait.  She reports a fall that occurred about 3 weeks ago where she did strike her lower back but had no weakness and was initially fine for a couple weeks until the past 1 week.  Had an outpatient MRI lumbar spine 4 days ago  Followed up earlier today with PMR who recommended neurosurgical evaluation and due to her right leg weakness referred her to the ED.   Physical Exam   Triage Vital Signs: ED Triage Vitals  Encounter Vitals Group     BP 06/27/24 1302 136/78     Girls Systolic BP Percentile --      Girls Diastolic BP Percentile --      Boys Systolic BP Percentile --      Boys Diastolic BP Percentile --      Pulse Rate 06/27/24 1302 71     Resp 06/27/24 1302 18     Temp 06/27/24 1302 98.1 F (36.7 C)     Temp Source 06/27/24 1302 Oral     SpO2 06/27/24 1302 96 %     Weight 06/27/24 1305 209 lb 7 oz (95 kg)     Height 06/27/24 1305 5' 4 (1.626 m)     Head Circumference --      Peak Flow --      Pain Score 06/27/24 1305 8     Pain Loc --      Pain Education --      Exclude from Growth Chart --     Most recent vital signs: Vitals:   06/27/24 1850 06/27/24 1900  BP: (!) 143/66 131/73  Pulse: 71 71  Resp: 18   Temp:    SpO2: 96% 98%    General: Awake, no distress.  CV:  Good peripheral perfusion.  Resp:  Normal effort.  Abd:  No distention.  MSK:  No deformity noted.  Neuro:  No acute deficits appreciated.  Some right hip flexor weakness but may be related to pain and discomfort, inconsistent Other:     ED Results / Procedures / Treatments   Labs (all labs ordered are listed, but only abnormal  results are displayed) Labs Reviewed  COMPREHENSIVE METABOLIC PANEL WITH GFR - Abnormal; Notable for the following components:      Result Value   BUN 27 (*)    Creatinine, Ser 1.13 (*)    AST 14 (*)    GFR, Estimated 53 (*)    All other components within normal limits  URINALYSIS, ROUTINE W REFLEX MICROSCOPIC - Abnormal; Notable for the following components:   Color, Urine YELLOW (*)    APPearance CLOUDY (*)    Hgb urine dipstick MODERATE (*)    Ketones, ur 5 (*)    Leukocytes,Ua MODERATE (*)    Bacteria, UA RARE (*)    All other components within normal limits  URINE CULTURE  CBC  CBG MONITORING, ED    EKG   RADIOLOGY CT lumbar spine interpreted by me without fracture  Official radiology report(s): CT Lumbar Spine Wo Contrast Result Date: 06/27/2024 EXAM: CT OF THE LUMBAR SPINE WITHOUT CONTRAST  06/27/2024 08:24:19 PM TECHNIQUE: CT of the lumbar spine was performed without the administration of intravenous contrast. Multiplanar reformatted images are provided for review. Automated exposure control, iterative reconstruction, and/or weight based adjustment of the mA/kV was utilized to reduce the radiation dose to as low as reasonably achievable. COMPARISON: MRI lumbar spine 06/23/2024. CLINICAL HISTORY: eval fracture, right leg weakness after fall. Patient to ED from POV for right leg weakness- started 4 days ago. PT had MRI on Friday showing an increase in stenosis and now feeling like she might loose her bowels and bladder (has not yet). Saw PCP today and sent for neuro-surgery consult FINDINGS: BONES AND ALIGNMENT: Normal vertebral body heights. No acute fracture or suspicious bone lesion. Slight right convex lumbar curve. DEGENERATIVE CHANGES: Posterior disc bulge at L1-L2 causes moderate spinal canal narrowing. Moderate to advanced spinal canal narrowing at L4-L5. Multilevel spondylosis, disc space height loss, and degenerative endplate changes which are advanced at L2-L3 and  L5-S1. Multilevel moderate facet arthropathy. SOFT TISSUES: No acute abnormality. IMPRESSION: 1. Moderate to advanced spinal canal narrowing at L4-L5 similar to prior MRI . 2. No acute fracture. Electronically signed by: Norman Gatlin MD 06/27/2024 08:33 PM EDT RP Workstation: [id number]    PROCEDURES and INTERVENTIONS:  Procedures  Medications - No data to display   IMPRESSION / MDM / ASSESSMENT AND PLAN / ED COURSE  I reviewed the triage vital signs and the nursing notes.  Differential diagnosis includes, but is not limited to, lumbar fracture, cauda equina, radiculopathy  {Patient presents with symptoms of an acute illness or injury that is potentially life-threatening.  Patient presents with acute on chronic back pain and concerns for right leg weakness.  Review outpatient MRI, progression at L4/5 but this does not line up with intermittent weakness with hip flexion.  A consult neurosurgery evaluates the patient and recommends CT to assess for fracture in the setting of her recent fall.  No signs of fracture.  He agrees to follow-up as an outpatient and does not require surgery today.  Discussed return precautions  Clinical Course as of 06/27/24 2113  Tue Jun 27, 2024  [id number] Consult with neurosurgery Dr. Penne Sharps who agrees to evaluate [DS]    Clinical Course User Index [DS] Sharps Rover, MD     FINAL CLINICAL IMPRESSION(S) / ED DIAGNOSES   Final diagnoses:  Chronic right-sided low back pain with right-sided sciatica     Rx / DC Orders   ED Discharge Orders     None        Note:  This document was prepared using Dragon voice recognition software and may include unintentional dictation errors.   Sharps Rover, MD 06/27/24 2114

## 2024-06-27 NOTE — Consult Note (Signed)
 Consulting Department:  Emergency department  Primary Physician:  Verdia [name], MD  Chief Complaint: Back pain  History of Present Illness: 06/27/2024 Tonya Boyd is a 68 y.o. female who presents with the chief complaint of back pain after a fall approximately 3 days ago.  She does have a history of chronic back pain and was getting injections in her spine.  She was walking and missed a step and fell down onto her buttocks and back.  She does have some numbness and full feeling down in her buttocks.  But she has no new distal lower extremity weakness.  She states that she has no vaginal numbness or tingling.  She has not had any incontinence but she does feel a full sensation in her rectum.  She does have previous right sided deficits after a stroke, she states that her major issue is with pain in her back, and that this has limited her ability to walk.  She does not feel that she has any new major weakness, rather she feels like her pain in her back is limiting her severely and makes her feel weak overall when it is spiking.  The symptoms are causing a significant impact on the patient's life.   Review of Systems:  A 10 point review of systems is negative, except for the pertinent positives and negatives detailed in the HPI.  Past Medical History: Past Medical History:  Diagnosis Date   Angina pectoris    Anxiety    Asthma    CAD (coronary artery disease), native coronary artery 08/02/2018   a.) [facility] 08/02/2018: EF 45%, super-dominant large RCA, 80% p-mRCA, 50% oRPDA, 80% D1; mPA 13, mPCWP 8, PA sat 81%, Ao sat 99, CO 6.95, CI 3.65 --> 4.0 x 30 mm Orsiro DES x1 to [facility]; b.) [facility] 10/07/2018: 50-60% oD1 (FFR 0.96), diffuse HG stenosis prox seg very small (<1 mm) RI --> med mgmt; c.) [facility] 04/08/2021: 80% pRCA (4.0 x 16 mm Synergy XD DES) and 80% oPDA (2.5 x 12 mm Synergy XD DES)   Chronic bronchitis (HCC)    Chronic kidney disease    Complication of anesthesia    a.)  postoperative nausea only (no vomiting); also shakes; per patient relieved w/ IV diphenhydramine    COPD (chronic obstructive pulmonary disease) (HCC)    Depression    Diastolic dysfunction 07/23/2018   a.) TTE 07/23/2018: EF 55-60%, GLS -19.6%, G1DD; b.) TTE 11/01/2020: EF 55%, triv AR, G1DD   Family history of adverse reaction to anesthesia    cousin stopped breathing; he was allergic to the anesthesia (08/02/2018)   GERD (gastroesophageal reflux disease)    High cholesterol    History of gout    History of hiatal hernia    History of kidney stones    Hypertension 07/2018   Interstitial cystitis    Long term current use of antithrombotics/antiplatelets    a.) daily DAPT therapy (ASA + clopidogrel )   Migraines    NSVT (nonsustained ventricular tachycardia) (HCC) 07/2018   a.) holter 07/13/2018: 6 beat run NSVT; maximum rate 133 bpm.   OA (osteoarthritis)    PONV (postoperative nausea and vomiting)    Raynaud's disease    Stroke (HCC)    Stroke-like episode 06/08/2019   a.) speech difficulty, right sided weakness/paraesthesias; unclear etiology ?? possibly conversion disorder. MRI brain x 2 negative for stroke.    Past Surgical History: Past Surgical History:  Procedure Laterality Date   ABDOMINAL HYSTERECTOMY     ANTERIOR CERVICAL DECOMP/DISCECTOMY  FUSION     APPENDECTOMY     AUGMENTATION MAMMAPLASTY Bilateral    BACK SURGERY     BREAST BIOPSY Right 06/28/2023   US  RT BREAST BX W LOC DEV 1ST LESION IMG BX SPEC US  GUIDE 06/28/2023 [facility] MAMMOGRAPHY   CATARACT EXTRACTION, BILATERAL Bilateral    CORONARY ANGIOPLASTY WITH STENT PLACEMENT  08/02/2018   CORONARY PRESSURE/FFR STUDY N/A 10/07/2018   Procedure: INTRAVASCULAR PRESSURE WIRE/FFR STUDY;  Surgeon: Ladona Heinz, MD;  Location: [facility];  Service: Cardiovascular;  Laterality: N/A;   CORONARY STENT INTERVENTION N/A 08/02/2018   Procedure: CORONARY STENT INTERVENTION;  Surgeon: Ladona Heinz, MD;  Location: [facility]  INVASIVE [facility];  Service: Cardiovascular;  Laterality: N/A;  RCA    CORONARY STENT INTERVENTION N/A 04/08/2021   Procedure: CORONARY STENT INTERVENTION;  Surgeon: Ladona Heinz, MD;  Location: [facility];  Service: Cardiovascular;  Laterality: N/A;  RCA and PDA   CYSTOSCOPY W/ STONE MANIPULATION     KNEE ARTHROSCOPY Right    LAPAROSCOPIC CHOLECYSTECTOMY     LEFT HEART CATH AND CORONARY ANGIOGRAPHY N/A 10/07/2018   Procedure: LEFT HEART CATH AND CORONARY ANGIOGRAPHY;  Surgeon: Ladona Heinz, MD;  Location: [facility];  Service: Cardiovascular;  Laterality: N/A;   LEFT HEART CATH AND CORONARY ANGIOGRAPHY N/A 04/08/2021   Procedure: LEFT HEART CATH AND CORONARY ANGIOGRAPHY;  Surgeon: Ladona Heinz, MD;  Location: [facility];  Service: Cardiovascular;  Laterality: N/A;   LEFT HEART CATH AND CORONARY ANGIOGRAPHY N/A 09/04/2022   Procedure: LEFT HEART CATH AND CORONARY ANGIOGRAPHY;  Surgeon: Ladona Heinz, MD;  Location: [facility];  Service: Cardiovascular;  Laterality: N/A;   PLANTAR FASCIA RELEASE Left 09/22/2023   Procedure: PLANTAR FASCIA RELEASE;  Surgeon: Ashley Soulier, DPM;  Location: [facility];  Service: Orthopedics/Podiatry;  Laterality: Left;   REPAIR ANKLE LIGAMENT Left    tied up ligament; had tore up qthing in my ankle   REPLACEMENT TOTAL KNEE Right    RIGHT/LEFT HEART CATH AND CORONARY ANGIOGRAPHY N/A 08/02/2018   Procedure: RIGHT/LEFT HEART CATH AND CORONARY ANGIOGRAPHY;  Surgeon: Ladona Heinz, MD;  Location: [facility];  Service: Cardiovascular;  Laterality: N/A;   SHOULDER SURGERY Bilateral    TARSAL TUNNEL RELEASE Left 09/22/2023   Procedure: [id number] BAXTER'S RELEASE;  Surgeon: Ashley Soulier, DPM;  Location: [facility];  Service: Orthopedics/Podiatry;  Laterality: Left;   TOTAL HIP ARTHROPLASTY Left 11/24/2022   Procedure: TOTAL HIP ARTHROPLASTY;  Surgeon: Edie Norleen PARAS, MD;  Location: [facility];  Service: Orthopedics;  Laterality: Left;    TOTAL KNEE ARTHROPLASTY Right 05/12/2022   Procedure: TOTAL KNEE ARTHROPLASTY;  Surgeon: Edie Norleen PARAS, MD;  Location: [facility];  Service: Orthopedics;  Laterality: Right;   TUBAL LIGATION      Allergies: Allergies as of 06/27/2024 - Review Complete 06/27/2024  Allergen Reaction Noted   Adhesive [tape] Other (See Comments) 06/08/2019   Brilinta  [ticagrelor ] Shortness Of Breath 04/22/2021   Statins Other (See Comments) 12/27/2018   Azithromycin Diarrhea 12/28/2023   Carvedilol  Other (See Comments) 02/22/2019   Diovan [valsartan] Other (See Comments) 07/08/2018   Hydrochlorothiazide Other (See Comments) 02/22/2019   Ibuprofen Other (See Comments) 02/22/2019   Keflex [cephalexin] Other (See Comments) 07/08/2018   Morphine and codeine  Other (See Comments) 07/08/2018   Norvasc [amlodipine besylate] Other (See Comments) 07/08/2018   Nsaids Other (See Comments) 10/06/2011   Prednisone  Other (See Comments) 07/08/2018   Reglan  [metoclopramide ] Other (See Comments) 07/08/2018  Shrimp (diagnostic) Swelling 09/16/2023   Shrimp extract Swelling 09/16/2023   Singulair [montelukast] Diarrhea 07/08/2018   Temazepam Hives 06/27/2021    Medications: No current facility-administered medications for this encounter.  Current Outpatient Medications:    acebutolol  (SECTRAL ) 200 MG capsule, TAKE 1 CAPSULE BY MOUTH TWICE  DAILY, Disp: 200 capsule, Rfl: 2   aspirin  81 MG chewable tablet, Chew 1 tablet (81 mg total) by mouth daily., Disp: , Rfl:    cyanocobalamin  (,VITAMIN B-12,) 1000 MCG/ML injection, Inject 1,000 mcg into the skin every 30 (thirty) days., Disp: , Rfl:    divalproex  (DEPAKOTE  ER) 250 MG 24 hr tablet, Take 1 tablet (250 mg total) by mouth daily., Disp: 30 tablet, Rfl: 5   divalproex  (DEPAKOTE  ER) 500 MG 24 hr tablet, Take 1 tablet (500 mg total) by mouth at bedtime., Disp: 90 tablet, Rfl: 1   Evolocumab  (REPATHA  SURECLICK) 140 MG/ML SOAJ, INJECT 140 MG INTO THE SKIN EVERY 2 WEEKS.,  Disp: 2 mL, Rfl: 11   hydrOXYzine  (ATARAX ) 25 MG tablet, Take 25 mg by mouth 3 (three) times daily as needed., Disp: , Rfl:    isosorbide  mononitrate (IMDUR ) 60 MG 24 hr tablet, Take 1 tablet (60 mg total) by mouth daily., Disp: 90 tablet, Rfl: 1   loratadine  (CLARITIN ) 10 MG tablet, Take 10 mg by mouth at bedtime., Disp: , Rfl:    losartan  (COZAAR ) 25 MG tablet, Take 1 tablet by mouth in the evening, Disp: 90 tablet, Rfl: 2   MYRBETRIQ 25 MG TB24 tablet, Take 25 mg by mouth daily., Disp: , Rfl:    nitroGLYCERIN  (NITROSTAT ) 0.4 MG SL tablet, DISSOLVE 1 TABLET SUBLINGUALLY AS NEEDED FOR CHEST PAIN, MAY REPEAT EVERY 5 MINUTES. AFTER 3 CALL 911., Disp: 25 tablet, Rfl: 0   ondansetron  (ZOFRAN ) 4 MG tablet, Take 1 tablet (4 mg total) by mouth every 6 (six) hours as needed for nausea., Disp: 30 tablet, Rfl: 0   pantoprazole  (PROTONIX ) 20 MG tablet, TAKE 1 TABLET BY MOUTH DAILY, Disp: 100 tablet, Rfl: 2   Pumpkin Seed-Soy Germ (AZO BLADDER CONTROL/GO-LESS PO), Take by mouth 2 (two) times daily., Disp: , Rfl:    SYMBICORT 80-4.5 MCG/ACT inhaler, Inhale 2 puffs into the lungs daily as needed (Coughing)., Disp: , Rfl:    tiZANidine  (ZANAFLEX ) 4 MG tablet, Take 1 tablet (4 mg total) by mouth every 8 (eight) hours as needed for muscle spasms., Disp: 30 tablet, Rfl: 0   traZODone  (DESYREL ) 100 MG tablet, Take 100 mg by mouth at bedtime., Disp: , Rfl:    Vitamin D , Ergocalciferol , (DRISDOL ) 1.25 MG (50000 UT) CAPS capsule, Take 50,000 Units by mouth every Monday., Disp: , Rfl:    XARELTO 2.5 MG TABS tablet, Take 2.5 mg by mouth 2 (two) times daily., Disp: , Rfl:    Social History: Social History   Tobacco Use   Smoking status: Never    Passive exposure: Never   Smokeless tobacco: Never  Vaping Use   Vaping status: Never Used  Substance Use Topics   Alcohol use: Not Currently    Comment: 08/02/2018 not since my 20's   Drug use: Never    Family Medical History: Family History  Problem Relation  Age of Onset   Kidney failure Mother    Diabetes Mother    Heart disease Father    Heart attack Father    Colon cancer Brother    Liver cancer Brother    Heart disease Other    Arthritis Other  Cancer Other    Asthma Other    Diabetes Other    Kidney disease Other     Physical Examination: Vitals:   06/27/24 1850 06/27/24 1900  BP: (!) 143/66 131/73  Pulse: 71 71  Resp: 18   Temp:    SpO2: 96% 98%     General: Patient is well developed, well nourished, calm, collected, and in no apparent distress.  NEUROLOGICAL:  General: In no acute distress Awake, alert, oriented to person, place, and time.  Pupils equal round and reactive to light.  Facial tone is symmetric.  Tongue protrusion is midline.  There is no pronator drift.  Strength: She does have very inconsistent motor movements on physical examination  Side Iliopsoas Quads Hamstring PF DF EHL  R She states that she has not had a full range of motion in the right hip flexor since her stroke but does feel that when her back is hurting that this is worse than it was previously. 5 5 5 5 5   L 5 5 5 5 5 5     She states that she does not have any new numbness or tingling in her right lower extremity.  Reflexes are 1+ throughout the bilateral lower extremities.  Clonus is not present.  Toes are down-going.    Gait is normal.  No difficulty with tandem gait.    Imaging: Narrative & Impression  CLINICAL DATA:  Lumbar radiculitis   EXAM: MRI LUMBAR SPINE WITHOUT CONTRAST   TECHNIQUE: Multiplanar, multisequence MR imaging of the lumbar spine was performed. No intravenous contrast was administered.   COMPARISON:  09/30/2021   FINDINGS: Segmentation: The lowest lumbar type non-rib-bearing vertebra is labeled as L5.   Alignment: 3 mm degenerative retrolisthesis at L1-2 and L5-S1. 4 mm degenerative retrolisthesis at L2-3. mild dextroconvex lumbar scoliosis with rotary component.   Vertebrae: Disc desiccation at all  lumbar levels with loss of disc height most prominent at L2-3 and L5-S1.   Mild type 2 degenerative endplate findings versus a small hemangioma along the right posterosuperior endplate of L1. Type 2 degenerative endplate findings at L2-3.   Conus medullaris and cauda equina: Conus extends to the L1 level. Conus and cauda equina appear normal.   Paraspinal and other soft tissues: Unremarkable   Disc levels:   T12-L1: No impingement. Slightly left paracentral disc protrusion extends cephalad.   L1-2: Moderate central stenosis due to disc bulge with central annular tear and short pedicles. Similar to prior.   L2-3: Moderate central stenosis mild displacement the left L2 nerve in the lateral extraforaminal space due to disc bulge, short pedicles, and left foraminal and lateral extraforaminal intervertebral spurring. Similar to prior.   L3-4: No impingement. Disc bulge noted. Mild bilateral degenerative facet arthropathy.   L4-5: Moderate to prominent central stenosis due to disc bulge, facet arthropathy, and short pedicles. Worsened from prior.   L5-S1: Moderate right foraminal stenosis due to intervertebral and facet spurring, similar to prior. Mild disc bulge.   IMPRESSION: 1. Lumbar spondylosis, degenerative disc disease, and short pedicles causing moderate to prominent impingement at L4-5; and moderate impingement at L1-2, L2-3, and L5-S1. Impingement at L4-5 is worsened from prior.     Electronically Signed   By: Ryan Salvage M.D.   On: 06/23/2024 16:27       I have personally reviewed the images and agree with the above interpretation.  These imaging findings appear to be mostly chronic with no evidence of acute disc herniation or acute changes  Labs:    Latest Ref Rng & Units 06/27/2024    2:18 PM 02/22/2024    2:15 PM 01/16/2024    4:11 PM  CBC  WBC 4.0 - 10.5 K/uL 9.6  8.7  8.5   Hemoglobin 12.0 - 15.0 g/dL 85.9  85.0  85.3   Hematocrit 36.0 - 46.0  % 42.0  46.7  43.6   Platelets 150 - 400 K/uL 276  233  211       Latest Ref Rng & Units 06/27/2024    2:18 PM 02/22/2024    2:15 PM 01/16/2024    3:17 PM  BMP  Glucose 70 - 99 mg/dL 87  81  96   BUN 8 - 23 mg/dL 27  24  23    Creatinine 0.44 - 1.00 mg/dL 8.86  8.97  8.81   Sodium 135 - 145 mmol/L 139  137  136   Potassium 3.5 - 5.1 mmol/L 3.9  4.3  4.5   Chloride 98 - 111 mmol/L 98  101  99   CO2 22 - 32 mmol/L 29  24  26    Calcium  8.9 - 10.3 mg/dL 9.1  9.4  9.3         Assessment and Plan: Ms. Scrogham is a pleasant 68 y.o. female with history of a CVA and chronic back pain.  She has been being treated for back pain and had recent spinal injections.  Unfortunately few days ago she fell landing on her buttocks and back which caused the severe exacerbation of her back pain.  She had a full feeling in her rectum but has not had any bowel or bladder incontine.  She does not have any new perineal sensation loss.  Her strength exam appears to be at her baseline, she does have some pain limited weakness in her right hip flexor, this apparently has been present since her previous stroke.  She does feel like this gets worse or her ability to lift her leg fully gets worse when her pain in her back is spiking.  On physical examination otherwise she does not have any localizable deficits.  Her MRI was reviewed and shows moderate stenosis at L4-5 and moderate stenosis above these levels.  No clear acute changes on her MRI that would be consistent with her symptomatology.  I do recommend getting a CT scan given her recent trauma and predominant back pain.  If she would require a spinal operation, we will need to have her treated for her osteoporosis, we will recommend that she undergo a significant amount of physical therapy and will need to be medically optimized for surgical procedure.  Will have her follow-up in our clinic for initial spine evaluation at this time she does not appear to have cauda  equina syndrome or any neurosurgical emergent issue.  Penne MICAEL Sharps, MD/MSCR Dept. of Neurosurgery

## 2024-06-27 NOTE — ED Triage Notes (Signed)
 Patient to ED from POV for right leg weakness- started 4 days ago. PT had MRI on Friday showing an increase in stenosis and now feeling like she might loose her bowels and bladder (has not yet). Saw PCP today and sent for neuro-surgery consult.

## 2024-06-30 LAB — URINE CULTURE

## 2024-07-03 NOTE — Progress Notes (Unsigned)
 Referring Physician:  Verdia [name], MD [address] [place],  [place] [address]  Primary Physician:  Verdia [name], MD  History of Present Illness: 07/04/2024 [name] is here today with a chief complaint of ***  ER visit 10/21.  Numbness in the private area. She has pain in her buttocks and will go into the right hip. Pain in her buttock area. Has numbness when she wipes. Very painful   Back into right hip   Says new numbness in anal rectum- feels more on the right side. , feels like its numb, has a hard time pushing out stool.     Sometimes feels like she is straining to pee, has a hard time going- also new.   Feels pain related weakness in the right hip.  Waqlking with a cane since fall   Tizanidine    Was given prednisone  taper    Room 6 Duration: had a fall on 06/09/24 and this made things worse.  Location: *** Quality: *** Severity: 8/10  Precipitating: aggravated by walking, standing Modifying factors: made better by *** Weakness: none Timing: constant Bowel/Bladder Dysfunction: none  Conservative measures:  Physical therapy: has participated in the past, around 2016-2017 Multimodal medical therapy including regular antiinflammatories: tizanidine  Injections:   06/07/2024: Bilateral S1 transforaminal ESI (fell 2 days after due tripping over threshold no relief) 05/19/2024: bilateral L5 trigger point injections (no relief) 03/02/2024: Bilateral S1 transforaminal ESI (50% relief, Dr. Dodson) 04/05/2023: Bilateral S1 transforaminal ESI 10/23/2022: Left greater trochanteric bursa injection 10/06/2022: Left hip joint injection (good relief of goin pain and continued lateral hip pain) 06/02/2022: Bilateral S1 transforaminal ESI (good relief) 10/31/2021: Bilateral S1 transforaminal ESI (good relief) 10/07/2021: Bilateral L3-4 transforaminal ESI (good relief of mid back pain) 02/28/2018: Caudal ESI 01/24/2018: Medial branch  blocks in the bilateral L1-2, L2-3, L3-4, L4-5 and L5-S1 facet joints From preferred pain management and spine care in [facility]   Past Surgery:  Neck surgery about 20-30 years ago.   Tonya Boyd has ***no symptoms of cervical myelopathy.  The symptoms are causing a significant impact on the patient's life.   Review of Systems:  A 10 point review of systems is negative, except for the pertinent positives and negatives detailed in the HPI.  Past Medical History: Past Medical History:  Diagnosis Date   Angina pectoris    Anxiety    Asthma    CAD (coronary artery disease), native coronary artery 08/02/2018   a.) [facility] 08/02/2018: EF 45%, super-dominant large RCA, 80% p-mRCA, 50% oRPDA, 80% D1; mPA 13, mPCWP 8, PA sat 81%, Ao sat 99, CO 6.95, CI 3.65 --> 4.0 x 30 mm Orsiro DES x1 to [facility]; b.) [facility] 10/07/2018: 50-60% oD1 (FFR 0.96), diffuse HG stenosis prox seg very small (<1 mm) RI --> med mgmt; c.) [facility] 04/08/2021: 80% pRCA (4.0 x 16 mm Synergy XD DES) and 80% oPDA (2.5 x 12 mm Synergy XD DES)   Chronic bronchitis (HCC)    Chronic kidney disease    Complication of anesthesia    a.) postoperative nausea only (no vomiting); also shakes; per patient relieved w/ IV diphenhydramine    COPD (chronic obstructive pulmonary disease) (HCC)    Depression    Diastolic dysfunction 07/23/2018   a.) TTE 07/23/2018: EF 55-60%, GLS -19.6%, G1DD; b.) TTE 11/01/2020: EF 55%, triv AR, G1DD   Family history of adverse reaction to anesthesia    cousin stopped breathing; he was allergic to the anesthesia (08/02/2018)   GERD (gastroesophageal reflux  disease)    High cholesterol    History of gout    History of hiatal hernia    History of kidney stones    Hypertension 07/2018   Interstitial cystitis    Long term current use of antithrombotics/antiplatelets    a.) daily DAPT therapy (ASA + clopidogrel )   Migraines    NSVT (nonsustained ventricular tachycardia) (HCC) 07/2018   a.) holter  07/13/2018: 6 beat run NSVT; maximum rate 133 bpm.   OA (osteoarthritis)    PONV (postoperative nausea and vomiting)    Raynaud's disease    Stroke [facility])    Stroke-like episode 06/08/2019   a.) speech difficulty, right sided weakness/paraesthesias; unclear etiology ?? possibly conversion disorder. MRI brain x 2 negative for stroke.    Past Surgical History: Past Surgical History:  Procedure Laterality Date   ABDOMINAL HYSTERECTOMY     ANTERIOR CERVICAL DECOMP/DISCECTOMY FUSION     APPENDECTOMY     AUGMENTATION MAMMAPLASTY Bilateral    BACK SURGERY     BREAST BIOPSY Right 06/28/2023   US  RT BREAST BX W LOC DEV 1ST LESION IMG BX SPEC US  GUIDE 06/28/2023 [facility] MAMMOGRAPHY   CATARACT EXTRACTION, BILATERAL Bilateral    CORONARY ANGIOPLASTY WITH STENT PLACEMENT  08/02/2018   CORONARY PRESSURE/FFR STUDY N/A 10/07/2018   Procedure: INTRAVASCULAR PRESSURE WIRE/FFR STUDY;  Surgeon: Ladona Heinz, MD;  Location: [facility];  Service: Cardiovascular;  Laterality: N/A;   CORONARY STENT INTERVENTION N/A 08/02/2018   Procedure: CORONARY STENT INTERVENTION;  Surgeon: Ladona Heinz, MD;  Location: [facility];  Service: Cardiovascular;  Laterality: N/A;  RCA    CORONARY STENT INTERVENTION N/A 04/08/2021   Procedure: CORONARY STENT INTERVENTION;  Surgeon: Ladona Heinz, MD;  Location: [facility];  Service: Cardiovascular;  Laterality: N/A;  RCA and PDA   CYSTOSCOPY W/ STONE MANIPULATION     KNEE ARTHROSCOPY Right    LAPAROSCOPIC CHOLECYSTECTOMY     LEFT HEART CATH AND CORONARY ANGIOGRAPHY N/A 10/07/2018   Procedure: LEFT HEART CATH AND CORONARY ANGIOGRAPHY;  Surgeon: Ladona Heinz, MD;  Location: [facility];  Service: Cardiovascular;  Laterality: N/A;   LEFT HEART CATH AND CORONARY ANGIOGRAPHY N/A 04/08/2021   Procedure: LEFT HEART CATH AND CORONARY ANGIOGRAPHY;  Surgeon: Ladona Heinz, MD;  Location: [facility];  Service: Cardiovascular;  Laterality: N/A;   LEFT HEART CATH  AND CORONARY ANGIOGRAPHY N/A 09/04/2022   Procedure: LEFT HEART CATH AND CORONARY ANGIOGRAPHY;  Surgeon: Ladona Heinz, MD;  Location: [facility];  Service: Cardiovascular;  Laterality: N/A;   PLANTAR FASCIA RELEASE Left 09/22/2023   Procedure: PLANTAR FASCIA RELEASE;  Surgeon: Ashley Soulier, DPM;  Location: [facility];  Service: Orthopedics/Podiatry;  Laterality: Left;   REPAIR ANKLE LIGAMENT Left    tied up ligament; had tore up qthing in my ankle   REPLACEMENT TOTAL KNEE Right    RIGHT/LEFT HEART CATH AND CORONARY ANGIOGRAPHY N/A 08/02/2018   Procedure: RIGHT/LEFT HEART CATH AND CORONARY ANGIOGRAPHY;  Surgeon: Ladona Heinz, MD;  Location: [facility];  Service: Cardiovascular;  Laterality: N/A;   SHOULDER SURGERY Bilateral    TARSAL TUNNEL RELEASE Left 09/22/2023   Procedure: [id number] BAXTER'S RELEASE;  Surgeon: Ashley Soulier, DPM;  Location: [facility];  Service: Orthopedics/Podiatry;  Laterality: Left;   TOTAL HIP ARTHROPLASTY Left 11/24/2022   Procedure: TOTAL HIP ARTHROPLASTY;  Surgeon: Edie Norleen PARAS, MD;  Location: [facility];  Service: Orthopedics;  Laterality: Left;   TOTAL KNEE  ARTHROPLASTY Right 05/12/2022   Procedure: TOTAL KNEE ARTHROPLASTY;  Surgeon: Edie Norleen PARAS, MD;  Location: [facility];  Service: Orthopedics;  Laterality: Right;   TUBAL LIGATION      Allergies: Allergies as of 07/04/2024 - Review Complete 06/27/2024  Allergen Reaction Noted   Adhesive [tape] Other (See Comments) 06/08/2019   Brilinta  [ticagrelor ] Shortness Of Breath 04/22/2021   Statins Other (See Comments) 12/27/2018   Azithromycin Diarrhea 12/28/2023   Carvedilol  Other (See Comments) 02/22/2019   Diovan [valsartan] Other (See Comments) 07/08/2018   Hydrochlorothiazide Other (See Comments) 02/22/2019   Ibuprofen Other (See Comments) 02/22/2019   Keflex [cephalexin] Other (See Comments) 07/08/2018   Morphine and codeine  Other (See Comments) 07/08/2018   Norvasc [amlodipine  besylate] Other (See Comments) 07/08/2018   Nsaids Other (See Comments) 10/06/2011   Prednisone  Other (See Comments) 07/08/2018   Reglan  [metoclopramide ] Other (See Comments) 07/08/2018   Shrimp (diagnostic) Swelling 09/16/2023   Shrimp extract Swelling 09/16/2023   Singulair [montelukast] Diarrhea 07/08/2018   Temazepam Hives 06/27/2021    Medications: Outpatient Encounter Medications as of 07/04/2024  Medication Sig   acebutolol  (SECTRAL ) 200 MG capsule TAKE 1 CAPSULE BY MOUTH TWICE  DAILY   aspirin  81 MG chewable tablet Chew 1 tablet (81 mg total) by mouth daily.   cyanocobalamin  (,VITAMIN B-12,) 1000 MCG/ML injection Inject 1,000 mcg into the skin every 30 (thirty) days.   divalproex  (DEPAKOTE  ER) 250 MG 24 hr tablet Take 1 tablet (250 mg total) by mouth daily.   divalproex  (DEPAKOTE  ER) 500 MG 24 hr tablet Take 1 tablet (500 mg total) by mouth at bedtime.   Evolocumab  (REPATHA  SURECLICK) 140 MG/ML SOAJ INJECT 140 MG INTO THE SKIN EVERY 2 WEEKS.   hydrOXYzine  (ATARAX ) 25 MG tablet Take 25 mg by mouth 3 (three) times daily as needed.   isosorbide  mononitrate (IMDUR ) 60 MG 24 hr tablet Take 1 tablet (60 mg total) by mouth daily.   loratadine  (CLARITIN ) 10 MG tablet Take 10 mg by mouth at bedtime.   losartan  (COZAAR ) 25 MG tablet Take 1 tablet by mouth in the evening   MYRBETRIQ 25 MG TB24 tablet Take 25 mg by mouth daily.   nitroGLYCERIN  (NITROSTAT ) 0.4 MG SL tablet DISSOLVE 1 TABLET SUBLINGUALLY AS NEEDED FOR CHEST PAIN, MAY REPEAT EVERY 5 MINUTES. AFTER 3 CALL 911.   ondansetron  (ZOFRAN ) 4 MG tablet Take 1 tablet (4 mg total) by mouth every 6 (six) hours as needed for nausea.   pantoprazole  (PROTONIX ) 20 MG tablet TAKE 1 TABLET BY MOUTH DAILY   Pumpkin Seed-Soy Germ (AZO BLADDER CONTROL/GO-LESS PO) Take by mouth 2 (two) times daily.   SYMBICORT 80-4.5 MCG/ACT inhaler Inhale 2 puffs into the lungs daily as needed (Coughing).   tiZANidine  (ZANAFLEX ) 4 MG tablet Take 1 tablet (4 mg  total) by mouth every 8 (eight) hours as needed for muscle spasms.   traZODone  (DESYREL ) 100 MG tablet Take 100 mg by mouth at bedtime.   Vitamin D , Ergocalciferol , (DRISDOL ) 1.25 MG (50000 UT) CAPS capsule Take 50,000 Units by mouth every Monday.   XARELTO 2.5 MG TABS tablet Take 2.5 mg by mouth 2 (two) times daily.   No facility-administered encounter medications on file as of 07/04/2024.    Social History: Social History   Tobacco Use   Smoking status: Never    Passive exposure: Never   Smokeless tobacco: Never  Vaping Use   Vaping status: Never Used  Substance Use Topics   Alcohol use: Not Currently  Comment: 08/02/2018 not since my 20's   Drug use: Never    Family Medical History: Family History  Problem Relation Age of Onset   Kidney failure Mother    Diabetes Mother    Heart disease Father    Heart attack Father    Colon cancer Brother    Liver cancer Brother    Heart disease Other    Arthritis Other    Cancer Other    Asthma Other    Diabetes Other    Kidney disease Other     Physical Examination: @VITALWITHPAIN @  General: Patient is well developed, well nourished, calm, collected, and in no apparent distress. Attention to examination is appropriate.  Psychiatric: Patient is non-anxious.  Head:  Pupils equal, round, and reactive to light.  ENT:  Oral mucosa appears well hydrated.  Neck:   Supple.  ***Full range of motion.  Respiratory: Patient is breathing without any difficulty.  Extremities: No edema.  Vascular: Palpable dorsal pedal pulses.  Skin:   On exposed skin, there are no abnormal skin lesions.  NEUROLOGICAL:     Awake, alert, oriented to person, place, and time.  Speech is clear and fluent. Fund of knowledge is appropriate.   Cranial Nerves: Pupils equal round and reactive to light.  Facial tone is symmetric.  Facial sensation is symmetric.  ROM of spine: ***full.  Palpation of spine: ***non tender.     Baseline right hip  weakness.    Pain with IR of hip  Strength: Side Biceps Triceps Deltoid Interossei Grip Wrist Ext. Wrist Flex.  R 5 5 5 5 5 5 5   L 5 5 5 5 5 5 5    Side Iliopsoas Quads Hamstring PF DF EHL  R 5 5 5 5 5 5   L 5 5 5 5 5 5    Reflexes are ***2+ and symmetric at the biceps, triceps, brachioradialis, patella and achilles.   Hoffman's is absent.  Clonus is not present.  Toes are down-going.  Bilateral upper and lower extremity sensation is intact to light touch.    Gait is normal.   No difficulty with tandem gait.   No evidence of dysmetria noted.  Medical Decision Making  Imaging: EXAM: CT OF THE LUMBAR SPINE WITHOUT CONTRAST 06/27/2024 08:24:19 PM   TECHNIQUE: CT of the lumbar spine was performed without the administration of intravenous contrast. Multiplanar reformatted images are provided for review. Automated exposure control, iterative reconstruction, and/or weight based adjustment of the mA/kV was utilized to reduce the radiation dose to as low as reasonably achievable.   COMPARISON: MRI lumbar spine 06/23/2024.   CLINICAL HISTORY: eval fracture, right leg weakness after fall. Patient to ED from POV for right leg weakness- started 4 days ago. PT had MRI on Friday showing an increase in stenosis and now feeling like she might loose her bowels and bladder (has not yet). Saw PCP today and sent for neuro-surgery consult   FINDINGS:   BONES AND ALIGNMENT: Normal vertebral body heights. No acute fracture or suspicious bone lesion. Slight right convex lumbar curve.   DEGENERATIVE CHANGES: Posterior disc bulge at L1-L2 causes moderate spinal canal narrowing. Moderate to advanced spinal canal narrowing at L4-L5. Multilevel spondylosis, disc space height loss, and degenerative endplate changes which are advanced at L2-L3 and L5-S1. Multilevel moderate facet arthropathy.   SOFT TISSUES: No acute abnormality.   IMPRESSION: 1. Moderate to advanced spinal canal narrowing at  L4-L5 similar to prior MRI . 2. No acute fracture.  EXAM: MRI LUMBAR  SPINE WITHOUT CONTRAST   TECHNIQUE: Multiplanar, multisequence MR imaging of the lumbar spine was performed. No intravenous contrast was administered.   COMPARISON:  09/30/2021   FINDINGS: Segmentation: The lowest lumbar type non-rib-bearing vertebra is labeled as L5.   Alignment: 3 mm degenerative retrolisthesis at L1-2 and L5-S1. 4 mm degenerative retrolisthesis at L2-3. mild dextroconvex lumbar scoliosis with rotary component.   Vertebrae: Disc desiccation at all lumbar levels with loss of disc height most prominent at L2-3 and L5-S1.   Mild type 2 degenerative endplate findings versus a small hemangioma along the right posterosuperior endplate of L1. Type 2 degenerative endplate findings at L2-3.   Conus medullaris and cauda equina: Conus extends to the L1 level. Conus and cauda equina appear normal.   Paraspinal and other soft tissues: Unremarkable   Disc levels:   T12-L1: No impingement. Slightly left paracentral disc protrusion extends cephalad.   L1-2: Moderate central stenosis due to disc bulge with central annular tear and short pedicles. Similar to prior.   L2-3: Moderate central stenosis mild displacement the left L2 nerve in the lateral extraforaminal space due to disc bulge, short pedicles, and left foraminal and lateral extraforaminal intervertebral spurring. Similar to prior.   L3-4: No impingement. Disc bulge noted. Mild bilateral degenerative facet arthropathy.   L4-5: Moderate to prominent central stenosis due to disc bulge, facet arthropathy, and short pedicles. Worsened from prior.   L5-S1: Moderate right foraminal stenosis due to intervertebral and facet spurring, similar to prior. Mild disc bulge.   IMPRESSION: 1. Lumbar spondylosis, degenerative disc disease, and short pedicles causing moderate to prominent impingement at L4-5; and moderate impingement at L1-2, L2-3,  and L5-S1. Impingement at L4-5 is worsened from prior.    I have personally reviewed the images and agree with the above interpretation.  Assessment and Plan: Tonya Boyd is a pleasant 68 y.o. female with ***  -PT -osteoporosis clinic  Thank you for involving me in the care of this patient.   I spent a total of *** minutes in both face-to-face and non-face-to-face activities for this visit on the date of this encounter.   Lyle Decamp, PA-C Dept. of Neurosurgery

## 2024-07-04 ENCOUNTER — Ambulatory Visit: Admitting: Physician Assistant

## 2024-07-04 ENCOUNTER — Ambulatory Visit

## 2024-07-04 ENCOUNTER — Encounter: Payer: Self-pay | Admitting: Physician Assistant

## 2024-07-04 VITALS — BP 126/80 | Ht 64.0 in | Wt 209.0 lb

## 2024-07-04 DIAGNOSIS — M48062 Spinal stenosis, lumbar region with neurogenic claudication: Secondary | ICD-10-CM

## 2024-07-04 DIAGNOSIS — W19XXXD Unspecified fall, subsequent encounter: Secondary | ICD-10-CM

## 2024-07-04 DIAGNOSIS — M25551 Pain in right hip: Secondary | ICD-10-CM

## 2024-07-04 DIAGNOSIS — M5126 Other intervertebral disc displacement, lumbar region: Secondary | ICD-10-CM

## 2024-07-04 DIAGNOSIS — M47816 Spondylosis without myelopathy or radiculopathy, lumbar region: Secondary | ICD-10-CM

## 2024-07-04 DIAGNOSIS — R200 Anesthesia of skin: Secondary | ICD-10-CM

## 2024-07-04 DIAGNOSIS — M545 Low back pain, unspecified: Secondary | ICD-10-CM | POA: Diagnosis not present

## 2024-07-07 NOTE — Addendum Note (Signed)
 Addended by: Franky Reier W on: 07/07/2024 10:14 AM   Modules accepted: Orders

## 2024-07-08 ENCOUNTER — Ambulatory Visit (HOSPITAL_COMMUNITY)
Admission: RE | Admit: 2024-07-08 | Discharge: 2024-07-08 | Disposition: A | Source: Ambulatory Visit | Attending: Physician Assistant | Admitting: Physician Assistant

## 2024-07-08 DIAGNOSIS — M6250 Muscle wasting and atrophy, not elsewhere classified, unspecified site: Secondary | ICD-10-CM | POA: Diagnosis not present

## 2024-07-08 DIAGNOSIS — Z96642 Presence of left artificial hip joint: Secondary | ICD-10-CM | POA: Insufficient documentation

## 2024-07-08 DIAGNOSIS — R200 Anesthesia of skin: Secondary | ICD-10-CM | POA: Insufficient documentation

## 2024-07-08 DIAGNOSIS — M2540 Effusion, unspecified joint: Secondary | ICD-10-CM | POA: Diagnosis not present

## 2024-07-08 DIAGNOSIS — M47896 Other spondylosis, lumbar region: Secondary | ICD-10-CM | POA: Diagnosis not present

## 2024-07-08 DIAGNOSIS — W19XXXA Unspecified fall, initial encounter: Secondary | ICD-10-CM | POA: Diagnosis not present

## 2024-07-08 DIAGNOSIS — M48062 Spinal stenosis, lumbar region with neurogenic claudication: Secondary | ICD-10-CM | POA: Diagnosis present

## 2024-07-08 DIAGNOSIS — M25551 Pain in right hip: Secondary | ICD-10-CM | POA: Diagnosis not present

## 2024-07-08 DIAGNOSIS — W19XXXD Unspecified fall, subsequent encounter: Secondary | ICD-10-CM

## 2024-07-13 ENCOUNTER — Telehealth: Payer: Self-pay | Admitting: Physician Assistant

## 2024-07-13 NOTE — Telephone Encounter (Signed)
 I called and spoke with the patient. She stated that [employer] last prescribed this for her and whitney told her that she should start getting this from us  now that we have seen her?  She is taking Tramadol  50mg , can take 1-2 pills 2 times a day. Last filled on 06/28/24 for a 15 day supply. Are you willing to refill this? If not who should she reach out to?

## 2024-07-13 NOTE — Telephone Encounter (Signed)
 I dont see that you have prescribed this medication? And I dont see mention of this in your note.  Please review.

## 2024-07-13 NOTE — Telephone Encounter (Signed)
 Date11/06/25 Provider FRISBIE Med Requesting Tramadol   Pharmacy [facility] Patient contact [phone] /

## 2024-07-14 ENCOUNTER — Other Ambulatory Visit: Payer: Self-pay | Admitting: Physician Assistant

## 2024-07-14 MED ORDER — TRAMADOL HCL 50 MG PO TABS: 50.0000 mg | ORAL_TABLET | Freq: Four times a day (QID) | ORAL | 0 refills | Status: DC | PRN

## 2024-07-14 NOTE — Telephone Encounter (Signed)
 Patient notified about prescription and verbalized understanding.

## 2024-07-18 ENCOUNTER — Ambulatory Visit: Admitting: Physician Assistant

## 2024-07-18 DIAGNOSIS — M48062 Spinal stenosis, lumbar region with neurogenic claudication: Secondary | ICD-10-CM

## 2024-07-18 DIAGNOSIS — M549 Dorsalgia, unspecified: Secondary | ICD-10-CM | POA: Diagnosis not present

## 2024-07-18 NOTE — Progress Notes (Signed)
 EXAM: MRI OF THE PELVIS WITHOUT INTRAVENOUS CONTRAST 07/08/2024 11:27:39 AM   TECHNIQUE: Multiplanar magnetic resonance images of the pelvis without intravenous contrast.   COMPARISON: Abdominal pelvic CT 07/27/2021 and right hip radiographs 07/04/2024.   CLINICAL HISTORY: Fall, new numbness, pain. Pain in the buttock area that goes into the right hip after falling.   FINDINGS:   SOFT TISSUES: The gluteus and common hamstring tendons appear intact. There is atrophy of the left piriformis and obturator internus muscles attributed to the prior left total hip arthroplasty. Mild fatty atrophy within the proximal right rectus femoris muscle. No acute muscular findings are identified. Sequela of subcutaneous injections superiorly in the right buttock.   JOINTS: Status post left total hip arthroplasty with expected postsurgical susceptibility artifact. No evidence of acute fracture, dislocation or right femoral head osteonecrosis. The sacroiliac joints and symphysis pubis are intact. No significant right hip joint effusion. Chronic spondylosis in the lower lumbar spine.   LIMITED INTRAPELVIC CONTENTS: Status post hysterectomy. There are diverticular changes within the sigmoid colon without definite signs of surrounding acute inflammation.   BONES: No acute fracture or focal osseous lesion.   IMPRESSION: 1. No acute findings or explanation for the patient's symptoms. 2. Previous left total hip arthroplasty. No evidence of acute fracture, dislocation, or right femoral head osteonecrosis. No significant right hip joint effusion. 3. Atrophy of the left piriformis and obturator internus muscles, attributed to prior left total hip arthroplasty.  MRI of the pelvis was reviewed with the patient.  This did not have any acute findings that were seen.  She continues to have significant back pain.  Plan is for patient to undergo home physical therapy and I will place order for this.  She  continues to take tramadol  and a muscle relaxer.  Will have patient follow-up with Dr. Claudene.  Will also reach out to the patient to possibly get back in for injections.  This visit was performed via telephone.  Patient location: home Provider location: office  I spent a total of 10 minutes non-face-to-face activities for this visit on the date of this encounter including review of current clinical condition and response to treatment.  The patient is aware of and accepts the limits of this telehealth visit.

## 2024-07-23 ENCOUNTER — Other Ambulatory Visit: Payer: Self-pay

## 2024-07-23 ENCOUNTER — Emergency Department (HOSPITAL_COMMUNITY)

## 2024-07-23 ENCOUNTER — Emergency Department (HOSPITAL_COMMUNITY)
Admission: EM | Admit: 2024-07-23 | Discharge: 2024-07-23 | Disposition: A | Discharge status: Home / Self Care | Attending: Emergency Medicine | Admitting: Emergency Medicine

## 2024-07-23 ENCOUNTER — Encounter (HOSPITAL_COMMUNITY): Payer: Self-pay | Admitting: *Deleted

## 2024-07-23 DIAGNOSIS — H66001 Acute suppurative otitis media without spontaneous rupture of ear drum, right ear: Secondary | ICD-10-CM | POA: Insufficient documentation

## 2024-07-23 DIAGNOSIS — Z7982 Long term (current) use of aspirin: Secondary | ICD-10-CM | POA: Insufficient documentation

## 2024-07-23 DIAGNOSIS — I2510 Atherosclerotic heart disease of native coronary artery without angina pectoris: Secondary | ICD-10-CM | POA: Diagnosis not present

## 2024-07-23 DIAGNOSIS — R0789 Other chest pain: Secondary | ICD-10-CM | POA: Diagnosis not present

## 2024-07-23 DIAGNOSIS — J449 Chronic obstructive pulmonary disease, unspecified: Secondary | ICD-10-CM | POA: Insufficient documentation

## 2024-07-23 DIAGNOSIS — R059 Cough, unspecified: Secondary | ICD-10-CM | POA: Diagnosis present

## 2024-07-23 DIAGNOSIS — J069 Acute upper respiratory infection, unspecified: Secondary | ICD-10-CM | POA: Insufficient documentation

## 2024-07-23 LAB — RESP PANEL BY RT-PCR (RSV, FLU A&B, COVID)  RVPGX2
Influenza A by PCR: NEGATIVE
Influenza B by PCR: NEGATIVE
Resp Syncytial Virus by PCR: NEGATIVE
SARS Coronavirus 2 by RT PCR: NEGATIVE

## 2024-07-23 MED ORDER — BENZONATATE 100 MG PO CAPS
100.0000 mg | ORAL_CAPSULE | Freq: Once | ORAL | Status: AC
  Administered 2024-07-23: 100 mg via ORAL
  Filled 2024-07-23: qty 1

## 2024-07-23 MED ORDER — ACETAMINOPHEN 500 MG PO TABS
1000.0000 mg | ORAL_TABLET | Freq: Once | ORAL | Status: AC
  Administered 2024-07-23: 1000 mg via ORAL
  Filled 2024-07-23: qty 2

## 2024-07-23 MED ORDER — TRAMADOL HCL 50 MG PO TABS
50.0000 mg | ORAL_TABLET | Freq: Once | ORAL | Status: AC
  Administered 2024-07-23: 50 mg via ORAL
  Filled 2024-07-23: qty 1

## 2024-07-23 MED ORDER — FLUTICASONE PROPIONATE 50 MCG/ACT NA SUSP: 2.0000 | Freq: Every day | NASAL | 0 refills | Status: AC

## 2024-07-23 MED ORDER — BENZONATATE 100 MG PO CAPS: 100.0000 mg | ORAL_CAPSULE | Freq: Three times a day (TID) | ORAL | 0 refills | Status: AC

## 2024-07-23 MED ORDER — TRAMADOL HCL 50 MG PO TABS: 50.0000 mg | ORAL_TABLET | Freq: Four times a day (QID) | ORAL | 0 refills | Status: DC | PRN

## 2024-07-23 MED ORDER — AMOXICILLIN-POT CLAVULANATE 875-125 MG PO TABS: 1.0000 | ORAL_TABLET | Freq: Two times a day (BID) | ORAL | 0 refills | Status: AC

## 2024-07-23 NOTE — Discharge Instructions (Addendum)
 Seen today for right-sided rib pain, cough and congestion and ear pain.  Likely have a viral infection.  You did have right-sided ear infection, I am prescribing antibiotics for this.  She complete the full course.  Your chest x-ray did not show any broken ribs, did not show pneumonia, your vital signs are all reassuring.  You take the tramadol  as needed for discomfort, the Tessalon Perles for cough and use the Flonase for nasal congestion.  Come back to the ER for new or worsening symptoms.

## 2024-07-23 NOTE — ED Provider Notes (Signed)
 [place] [facility]GENC[facility] DEPARTMENT AT [facility] Provider Note   CSN: [id number] Arrival date & time: 07/23/24  9040     Patient presents with: URI   Tonya Boyd is a 68 y.o. female.  ER today due to right sided rib pain.  She states she is having cough and congestion and bilateral ear pain 2 days ago, husband is here with same complaints today.  She was recently brought her to the hospital today is because she woke up this morning and every time she coughs, breathes or moves she has pain in the right upper interior chest and left lower posterior ribs.  States it hurts to cough, breathe, move and walk.  She denies fever or chills, cough has been dry, no nausea or vomiting.  She did not take any thing at home for pain this morning but states she has used tramadol  in the past with good relief, states stronger opiates make her feel unwell and she would like to avoid these.    URI      Prior to Admission medications   Medication Sig Start Date End Date Taking? Authorizing Provider  acebutolol  (SECTRAL ) 200 MG capsule TAKE 1 CAPSULE BY MOUTH TWICE  DAILY 12/30/23   Ladona Heinz, MD  aspirin  81 MG chewable tablet Chew 1 tablet (81 mg total) by mouth daily. 12/31/22   Waddell Karna DELENA, NP  cyanocobalamin  (,VITAMIN B-12,) 1000 MCG/ML injection Inject 1,000 mcg into the skin every 30 (thirty) days. 01/31/21   [provider]  divalproex  (DEPAKOTE  ER) 250 MG 24 hr tablet Take 1 tablet (250 mg total) by mouth daily. 01/12/24   Whitfield Raisin, NP  divalproex  (DEPAKOTE  ER) 500 MG 24 hr tablet Take 1 tablet (500 mg total) by mouth at bedtime. 01/12/24   Whitfield Raisin, NP  Evolocumab  (REPATHA  SURECLICK) 140 MG/ML SOAJ INJECT 140 MG INTO THE SKIN EVERY 2 WEEKS. 06/28/23   Ladona Heinz, MD  hydrOXYzine  (ATARAX ) 25 MG tablet Take 25 mg by mouth 3 (three) times daily as needed.    [provider]  isosorbide  mononitrate (IMDUR ) 60 MG 24 hr tablet Take 1 tablet (60 mg total) by mouth  daily. 05/22/24   Ladona Heinz, MD  loratadine  (CLARITIN ) 10 MG tablet Take 10 mg by mouth at bedtime.    [provider]  losartan  (COZAAR ) 25 MG tablet Take 1 tablet by mouth in the evening 11/12/23   Ladona Heinz, MD  MYRBETRIQ 25 MG TB24 tablet Take 25 mg by mouth daily. 01/14/24   [provider]  nitroGLYCERIN  (NITROSTAT ) 0.4 MG SL tablet DISSOLVE 1 TABLET SUBLINGUALLY AS NEEDED FOR CHEST PAIN, MAY REPEAT EVERY 5 MINUTES. AFTER 3 CALL 911. 07/16/23   Ladona Heinz, MD  ondansetron  (ZOFRAN ) 4 MG tablet Take 1 tablet (4 mg total) by mouth every 6 (six) hours as needed for nausea. 11/25/22   Kip Lynwood Double, PA-C  pantoprazole  (PROTONIX ) 20 MG tablet TAKE 1 TABLET BY MOUTH DAILY 12/30/23   Ladona Heinz, MD  Pumpkin Seed-Soy Germ (AZO BLADDER CONTROL/GO-LESS PO) Take by mouth 2 (two) times daily.    [provider]  SYMBICORT 80-4.5 MCG/ACT inhaler Inhale 2 puffs into the lungs daily as needed (Coughing). 08/10/22   [provider]  tiZANidine  (ZANAFLEX ) 4 MG tablet Take 1 tablet (4 mg total) by mouth every 8 (eight) hours as needed for muscle spasms. 12/31/22   Waddell Karna DELENA, NP  traMADol  (ULTRAM ) 50 MG tablet Take 1 tablet (50 mg total)  by mouth every 6 (six) hours as needed. 07/14/24 07/14/25  Ulis Bottcher, PA-C  traZODone  (DESYREL ) 100 MG tablet Take 100 mg by mouth at bedtime.    [provider]  Vitamin D , Ergocalciferol , (DRISDOL ) 1.25 MG (50000 UT) CAPS capsule Take 50,000 Units by mouth every Monday.    [provider]  XARELTO 2.5 MG TABS tablet Take 2.5 mg by mouth 2 (two) times daily. 01/20/24   [provider]    Allergies: Adhesive [tape], Brilinta  [ticagrelor ], Statins, Azithromycin, Carvedilol , Diovan [valsartan], Hydrochlorothiazide, Ibuprofen, Keflex [cephalexin], Morphine and codeine , Norvasc [amlodipine besylate], Nsaids, Prednisone , Reglan  [metoclopramide ], Shrimp (diagnostic), Shrimp extract, Singulair [montelukast], and  Temazepam    Review of Systems  Updated Vital Signs BP (!) 140/82   Pulse 73   Temp (!) 97.3 F (36.3 C)   Resp 17   Ht 5' 4 (1.626 m)   Wt 94.8 kg   SpO2 94%   BMI 35.87 kg/m   Physical Exam Vitals and nursing note reviewed.  Constitutional:      General: She is not in acute distress.    Appearance: She is well-developed.  HENT:     Head: Normocephalic and atraumatic.     Right Ear: Ear canal and external ear normal.     Left Ear: Tympanic membrane, ear canal and external ear normal.     Ears:     Comments: Right TM is erythematous with effusion.  No drainage, no mastoid tenderness    Mouth/Throat:     Mouth: Mucous membranes are moist.  Eyes:     Extraocular Movements: Extraocular movements intact.     Conjunctiva/sclera: Conjunctivae normal.     Pupils: Pupils are equal, round, and reactive to light.  Cardiovascular:     Rate and Rhythm: Normal rate and regular rhythm.     Heart sounds: No murmur heard. Pulmonary:     Effort: Pulmonary effort is normal. No respiratory distress.     Breath sounds: Normal breath sounds.  Chest:    Abdominal:     Palpations: Abdomen is soft.     Tenderness: There is no abdominal tenderness.  Musculoskeletal:        General: No swelling.     Cervical back: Neck supple.       Back:  Skin:    General: Skin is warm and dry.     Capillary Refill: Capillary refill takes less than 2 seconds.  Neurological:     General: No focal deficit present.     Mental Status: She is alert and oriented to person, place, and time.  Psychiatric:        Mood and Affect: Mood normal.     (all labs ordered are listed, but only abnormal results are displayed) Labs Reviewed  RESP PANEL BY RT-PCR (RSV, FLU A&B, COVID)  [id number]    EKG: None  Radiology: No results found.   Procedures   Medications Ordered in the ED  traMADol  (ULTRAM ) tablet 50 mg (has no administration in time range)  acetaminophen  (TYLENOL ) tablet 1,000 mg (has no  administration in time range)                                    Medical Decision Making This patient presents to the ED for concern of cough, congestion and right sided rib pain, and ear pain this involves an extensive number of treatment options, and is a complaint that  carries with it a high risk of complications and morbidity.  The differential diagnosis includes fracture, pneumonia, bronchitis, URI, sinusitis, otitis media, other   Co morbidities that complicate the patient evaluation  COPD, CAD   Additional history obtained:  Additional history obtained from EMR External records from outside source obtained and reviewed including previous notes   Lab Tests:  I Ordered, and personally interpreted labs.  The pertinent results include: Negative for COVID flu RSV   Imaging Studies ordered:  I ordered imaging studies including x-ray chest and right rib I independently visualized and interpreted imaging which showed no pulmonary edema, infiltrate or pneumothorax, no placed rib fracture I agree with the radiologist interpretation     Problem List / ED Course / Critical interventions / Medication management  Patient comes in today with complaint of URI symptoms for the past couple of days without fever, no shortness of breath.  Having persistent cough and states that every time she coughs she has really severe rib pain in the right anterior chest and right lower ribs, when she touches these areas or tries to move it becomes very painful.  She is not tachycardic, tachypneic or hypoxic, no history of VTE and no signs of DVT.  I suspect her pain is musculoskeletal due to pain from excessive coughing given that it is reproducible.  She was given tramadol  at her request that she has tolerated this well in the past and has stated relief of her symptoms, she is able to ambulate.  She was given Tessalon Perles for cough and she does have a right otitis media which will be treated with  Menton.  This will also cover for any possible pneumonia given her history of COPD though there is no sign of pneumonia on chest x-ray.  COVID flu and RSV were negative, husband has similar symptoms suggestive of viral illness. I ordered medication including tramadol  for pain Reevaluation of the patient after these medicines showed that the patient improved I have reviewed the patients home medicines and have made adjustments as needed    Test / Admission - Considered:   Considered admission at this time patient does not require admission and she would like to go home.    Amount and/or Complexity of Data Reviewed Radiology: ordered.  Risk OTC drugs. Prescription drug management.        Final diagnoses:  None    ED Discharge Orders     None          Suellen Sherran DELENA DEVONNA 07/23/24 1441    Yolande Lamar BROCKS, MD 07/25/24 1250

## 2024-07-23 NOTE — ED Triage Notes (Signed)
 Pt has multiple complaints; pt state she is having right rib pain from coughing and bilateral ear pain; sx started a few days ago

## 2024-07-24 ENCOUNTER — Telehealth: Payer: Self-pay | Admitting: Cardiology

## 2024-07-24 DIAGNOSIS — I25118 Atherosclerotic heart disease of native coronary artery with other forms of angina pectoris: Secondary | ICD-10-CM

## 2024-07-24 DIAGNOSIS — E782 Mixed hyperlipidemia: Secondary | ICD-10-CM

## 2024-07-24 MED ORDER — REPATHA SURECLICK 140 MG/ML ~~LOC~~ SOAJ: SUBCUTANEOUS | 0 refills | Status: AC

## 2024-07-24 NOTE — Telephone Encounter (Signed)
*  STAT* If patient is at the pharmacy, call can be transferred to refill team.   1. Which medications need to be refilled? (please list name of each medication and dose if known) Evolocumab  (REPATHA  SURECLICK) 140 MG/ML SOAJ    2. Would you like to learn more about the convenience, safety, & potential cost savings by using the [facility]?      3. Are you open to using the [facility] (Type [facility].  ).   4. Which pharmacy/location (including street and city if local pharmacy) is medication to be sent to? [facility] - [place], [place] - [address]    5. Do they need a 30 day or 90 day supply? 30 day

## 2024-07-27 ENCOUNTER — Other Ambulatory Visit: Payer: Self-pay | Admitting: Adult Health

## 2024-08-07 ENCOUNTER — Telehealth: Payer: Self-pay | Admitting: Physician Assistant

## 2024-08-07 NOTE — Telephone Encounter (Signed)
 Patient is calling to request a refill of Tramadol  be sent to [facility]. I do not see this in her med list, but there is a telephone encounter where Lyle has sent it in.

## 2024-08-07 NOTE — Telephone Encounter (Signed)
 Tonya Boyd did take over the refills, before that it was Tonya Boyd and was advised to start getting it from us . Last refill was done on 07/23/2024 from ER

## 2024-08-08 ENCOUNTER — Other Ambulatory Visit: Payer: Self-pay | Admitting: Physician Assistant

## 2024-08-08 MED ORDER — TRAMADOL HCL 50 MG PO TABS: 50.0000 mg | ORAL_TABLET | Freq: Four times a day (QID) | ORAL | 0 refills | Status: AC | PRN

## 2024-08-08 NOTE — Telephone Encounter (Signed)
 Patient notified via secure voicemail.

## 2024-08-09 ENCOUNTER — Other Ambulatory Visit: Payer: Self-pay | Admitting: Adult Health

## 2024-08-24 ENCOUNTER — Other Ambulatory Visit: Payer: Self-pay | Admitting: Cardiology

## 2024-08-24 DIAGNOSIS — I10 Essential (primary) hypertension: Secondary | ICD-10-CM

## 2024-09-17 ENCOUNTER — Other Ambulatory Visit: Payer: Self-pay | Admitting: Cardiology

## 2024-10-05 ENCOUNTER — Ambulatory Visit: Admitting: Cardiology

## 2024-10-17 ENCOUNTER — Ambulatory Visit: Admitting: Adult Health

## 2024-11-07 ENCOUNTER — Ambulatory Visit: Admitting: Adult Health
